# Patient Record
Sex: Female | Born: 1950 | Race: White | Hispanic: No | Marital: Single | Smoking: Never smoker
Health system: Southern US, Community
[De-identification: ages and names within clinical notes are randomized; demographics above are authoritative.]

## PROBLEM LIST (undated history)

## (undated) DIAGNOSIS — Z8719 Personal history of other diseases of the digestive system: Secondary | ICD-10-CM

## (undated) DIAGNOSIS — K219 Gastro-esophageal reflux disease without esophagitis: Secondary | ICD-10-CM

## (undated) DIAGNOSIS — T4145XA Adverse effect of unspecified anesthetic, initial encounter: Secondary | ICD-10-CM

## (undated) DIAGNOSIS — R112 Nausea with vomiting, unspecified: Secondary | ICD-10-CM

## (undated) DIAGNOSIS — C50919 Malignant neoplasm of unspecified site of unspecified female breast: Secondary | ICD-10-CM

## (undated) DIAGNOSIS — Z9221 Personal history of antineoplastic chemotherapy: Secondary | ICD-10-CM

## (undated) DIAGNOSIS — I10 Essential (primary) hypertension: Secondary | ICD-10-CM

## (undated) DIAGNOSIS — T8859XA Other complications of anesthesia, initial encounter: Secondary | ICD-10-CM

## (undated) DIAGNOSIS — Z9889 Other specified postprocedural states: Secondary | ICD-10-CM

## (undated) DIAGNOSIS — I499 Cardiac arrhythmia, unspecified: Secondary | ICD-10-CM

## (undated) DIAGNOSIS — E785 Hyperlipidemia, unspecified: Secondary | ICD-10-CM

## (undated) DIAGNOSIS — Z1371 Encounter for nonprocreative screening for genetic disease carrier status: Secondary | ICD-10-CM

## (undated) DIAGNOSIS — D649 Anemia, unspecified: Secondary | ICD-10-CM

## (undated) DIAGNOSIS — J45909 Unspecified asthma, uncomplicated: Secondary | ICD-10-CM

## (undated) DIAGNOSIS — C801 Malignant (primary) neoplasm, unspecified: Secondary | ICD-10-CM

## (undated) DIAGNOSIS — M1990 Unspecified osteoarthritis, unspecified site: Secondary | ICD-10-CM

## (undated) HISTORY — DX: Encounter for nonprocreative screening for genetic disease carrier status: Z13.71

## (undated) HISTORY — PX: OOPHORECTOMY: SHX86

## (undated) HISTORY — PX: BREAST EXCISIONAL BIOPSY: SUR124

## (undated) HISTORY — DX: Malignant neoplasm of unspecified site of unspecified female breast: C50.919

## (undated) HISTORY — PX: PORT-A-CATH REMOVAL: SHX5289

## (undated) HISTORY — DX: Malignant (primary) neoplasm, unspecified: C80.1

## (undated) HISTORY — PX: BREAST BIOPSY: SHX20

## (undated) HISTORY — PX: CARPAL TUNNEL RELEASE: SHX101

## (undated) HISTORY — PX: BREAST CYST EXCISION: SHX579

## (undated) HISTORY — PX: OTHER SURGICAL HISTORY: SHX169

---
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
## 1981-03-10 HISTORY — PX: NASAL RECONSTRUCTION: SHX2069

## 1988-03-10 HISTORY — PX: BREAST BIOPSY: SHX20

## 1996-03-10 HISTORY — PX: ABDOMINAL HYSTERECTOMY: SHX81

## 2003-05-09 DIAGNOSIS — I10 Essential (primary) hypertension: Secondary | ICD-10-CM | POA: Insufficient documentation

## 2004-06-18 ENCOUNTER — Ambulatory Visit: Payer: Self-pay | Admitting: Internal Medicine

## 2005-07-08 ENCOUNTER — Ambulatory Visit: Payer: Self-pay | Admitting: Internal Medicine

## 2005-08-18 ENCOUNTER — Ambulatory Visit: Payer: Self-pay | Admitting: Unknown Physician Specialty

## 2006-03-10 HISTORY — PX: CHOLECYSTECTOMY: SHX55

## 2006-06-12 ENCOUNTER — Ambulatory Visit: Payer: Self-pay | Admitting: Unknown Physician Specialty

## 2006-07-16 ENCOUNTER — Ambulatory Visit: Payer: Self-pay | Admitting: Internal Medicine

## 2007-01-20 ENCOUNTER — Ambulatory Visit: Payer: Self-pay | Admitting: Internal Medicine

## 2007-01-27 ENCOUNTER — Ambulatory Visit: Payer: Self-pay | Admitting: Internal Medicine

## 2007-02-19 ENCOUNTER — Ambulatory Visit: Payer: Self-pay | Admitting: General Surgery

## 2007-02-19 ENCOUNTER — Other Ambulatory Visit: Payer: Self-pay

## 2007-02-23 ENCOUNTER — Ambulatory Visit: Payer: Self-pay | Admitting: General Surgery

## 2007-07-20 ENCOUNTER — Ambulatory Visit: Payer: Self-pay | Admitting: Internal Medicine

## 2007-09-08 ENCOUNTER — Other Ambulatory Visit: Payer: Self-pay

## 2007-09-08 ENCOUNTER — Ambulatory Visit: Payer: Self-pay | Admitting: Orthopaedic Surgery

## 2007-09-28 ENCOUNTER — Ambulatory Visit: Payer: Self-pay | Admitting: Orthopaedic Surgery

## 2008-01-31 ENCOUNTER — Ambulatory Visit: Payer: Self-pay | Admitting: General Practice

## 2008-02-22 ENCOUNTER — Ambulatory Visit: Payer: Self-pay | Admitting: General Practice

## 2008-07-25 ENCOUNTER — Ambulatory Visit: Payer: Self-pay | Admitting: Internal Medicine

## 2008-08-16 ENCOUNTER — Ambulatory Visit: Payer: Self-pay | Admitting: Unknown Physician Specialty

## 2009-03-07 ENCOUNTER — Ambulatory Visit: Payer: Self-pay | Admitting: Urology

## 2009-03-13 ENCOUNTER — Ambulatory Visit: Payer: Self-pay | Admitting: Urology

## 2009-08-02 ENCOUNTER — Ambulatory Visit: Payer: Self-pay | Admitting: Internal Medicine

## 2010-02-21 DIAGNOSIS — D239 Other benign neoplasm of skin, unspecified: Secondary | ICD-10-CM

## 2010-02-21 HISTORY — DX: Other benign neoplasm of skin, unspecified: D23.9

## 2010-08-15 ENCOUNTER — Ambulatory Visit: Payer: Self-pay | Admitting: Internal Medicine

## 2010-09-04 ENCOUNTER — Other Ambulatory Visit: Payer: Self-pay | Admitting: Unknown Physician Specialty

## 2011-08-18 ENCOUNTER — Ambulatory Visit: Payer: Self-pay | Admitting: Internal Medicine

## 2011-08-20 ENCOUNTER — Ambulatory Visit: Payer: Self-pay | Admitting: Unknown Physician Specialty

## 2011-08-22 LAB — PATHOLOGY REPORT

## 2012-08-23 ENCOUNTER — Ambulatory Visit: Payer: Self-pay | Admitting: Internal Medicine

## 2013-08-24 ENCOUNTER — Ambulatory Visit: Payer: Self-pay | Admitting: Physician Assistant

## 2013-12-28 DIAGNOSIS — J45909 Unspecified asthma, uncomplicated: Secondary | ICD-10-CM | POA: Insufficient documentation

## 2013-12-28 DIAGNOSIS — K219 Gastro-esophageal reflux disease without esophagitis: Secondary | ICD-10-CM | POA: Insufficient documentation

## 2013-12-28 DIAGNOSIS — M1990 Unspecified osteoarthritis, unspecified site: Secondary | ICD-10-CM | POA: Insufficient documentation

## 2013-12-28 DIAGNOSIS — E785 Hyperlipidemia, unspecified: Secondary | ICD-10-CM | POA: Insufficient documentation

## 2014-01-18 ENCOUNTER — Encounter: Payer: Self-pay | Admitting: *Deleted

## 2014-01-19 ENCOUNTER — Encounter: Payer: Self-pay | Admitting: General Surgery

## 2014-01-19 ENCOUNTER — Ambulatory Visit (INDEPENDENT_AMBULATORY_CARE_PROVIDER_SITE_OTHER): Payer: Medicare Other | Admitting: General Surgery

## 2014-01-19 ENCOUNTER — Other Ambulatory Visit: Payer: Medicare Other

## 2014-01-19 VITALS — BP 122/60 | HR 70 | Temp 98.6°F | Resp 12 | Ht 66.5 in | Wt 159.0 lb

## 2014-01-19 DIAGNOSIS — N63 Unspecified lump in breast: Secondary | ICD-10-CM

## 2014-01-19 DIAGNOSIS — N6320 Unspecified lump in the left breast, unspecified quadrant: Secondary | ICD-10-CM

## 2014-01-19 NOTE — Progress Notes (Signed)
Patient ID: Mariah Hogan, female   DOB: Jul 30, 1950, 63 y.o.   MRN: [id number]  Chief Complaint  Patient presents with  . Other    left breast lump    HPI Mariah Hogan is a 63 y.o. female here today for a evaluation of a left breast lump. Patient noticed this past weekend. She states the area is sore to touch and overlying redness came up last night. Last month she thought she may have felt something "different" in the breast. Last mammogram was done on June 2015. Denies any injury. No history of nipple oral contact.   HPI  Past Medical History  Diagnosis Date  . Cancer     [phone] basal cell carcinoma    Past Surgical History  Procedure Laterality Date  . Tonsillectomy and adenoidectomy  1956  . Nasal reconstruction  1983  . Abdominal hysterectomy  1998  . Bladder tack  1856,3149  . Cholecystectomy  2008  . Carpal tunnel release Bilateral [id number]  . Breast biopsy Bilateral   . Breast biopsy Left 1990    Family History  Problem Relation Age of Onset  . Breast cancer Sister   . Cancer Paternal Grandmother     breast  . Cancer Cousin     breast  . Cancer Cousin     kidney    Social History History  Substance Use Topics  . Smoking status: Never Smoker   . Smokeless tobacco: Not on file  . Alcohol Use: No    Allergies  Allergen Reactions  . Penicillins     Current Outpatient Prescriptions  Medication Sig Dispense Refill  . atenolol (TENORMIN) 50 MG tablet Take 50 mg by mouth daily.    . citalopram (CELEXA) 20 MG tablet Take 20 mg by mouth daily.    Marland Kitchen losartan-hydrochlorothiazide (HYZAAR) 100-25 MG per tablet Take 1 tablet by mouth daily.    . meloxicam (MOBIC) 15 MG tablet Take 15 mg by mouth daily.    . pantoprazole (PROTONIX) 40 MG tablet Take 40 mg by mouth daily.     No current facility-administered medications for this visit.    Review of Systems Review of Systems  Constitutional: Negative.   Respiratory: Negative.   Cardiovascular:  Negative.     Blood pressure 122/60, pulse 70, temperature 98.6 F (37 C), temperature source Oral, resp. rate 12, height 5' 6.5" (1.689 m), weight 159 lb (72.122 kg).  Physical Exam Physical Exam  Constitutional: She is oriented to person, place, and time. She appears well-developed and well-nourished.  Neck: Neck supple.  Cardiovascular: Normal rate, regular rhythm and normal heart sounds.   Pulmonary/Chest: Effort normal and breath sounds normal. Right breast exhibits no inverted nipple, no mass, no nipple discharge, no skin change and no tenderness. Left breast exhibits skin change. Left breast exhibits no inverted nipple, no mass, no nipple discharge and no tenderness.    4 cm erythremia area lower outer quadrant left breast. 1.5 x 3 cm area of thickening below erythremia.   Lymphadenopathy:    She has no cervical adenopathy.    She has no axillary adenopathy.  Neurological: She is alert and oriented to person, place, and time.  Skin: Skin is warm and dry.    Data Reviewed Bilateral screening mammograms dated 08/24/2013 were reviewed. No interval change in comparison to previous exams. No abnormality in the lower outer quadrant of the left breast to correspond with today's exam. BI-RADS-1.  Ultrasound examination of the area of palpable thickening in  the lower outer quadrant of the left breast at the 4:00 position showed a ill-defined heterogeneous area with a slightly irregular border measuring 1.4 x 1.7 x 2.9 cm. No increased vascularity noted on duplex imaging.  The patient was amenable to core biopsy. 10 mL of 0.5% Xylocaine with 0.25% Marcaine with 1-200,000 of epinephrine was utilized well tolerated. ChloraPrep was applied to the skin.  A 14-gauge vacuum biopsy device was utilized and multiple core samples were taken from 2 locations within the mass. Needle location was guided both by ultrasound and by palpation. The patient tolerated the procedure well with scant bleeding. Skin  defect was closed with benzoin and Steri-Strips followed by Telfa and Tegaderm dressing. Written instructions regarding wound care were provided.  Assessment     Breast mass.    Plan    The patient will be contacted when pathology is available. Should a benign result be obtained, we will likely proceed to operative excision due to the unusual appearance.     Follow up based on pathology.  PCP:  Mariah Hogan 01/20/2014, 8:22 PM

## 2014-01-19 NOTE — Patient Instructions (Signed)
CARE AFTER BREAST BIOPSY  1. Leave the dressing on that your doctor applied after surgery. It is waterproof. You may bathe, shower and/or swim. The dressing will probably remain intact until your return office visit. If the dressing comes off, you will see small strips of tape against your skin on the incision. Do not remove these strips.  2. You may want to use a gauze,cloth or similar protection in your bra to prevent rubbing against your dressing and incision. This is not necessary, but you may feel more comfortable doing so.  3. It is recommended that you wear a bra day and night to give support to the breast. This will prevent the weight of the breast from pulling on the incision.  4. Your breast will feel hard and lumpy under the incision. Do not be alarmed. This is the underlying stitching of tissue. Softening of this tissue will occur in time.  5. Make sure you call the office and schedule an appointment in one week after your surgery. The office phone number is [phone]. [facility] at [facility] may have already done this for you.  6. You will notice about a week after your office visit that the strips of the tape on your incision will begin to loosen. These may then be removed.  7. Report to your doctor any of the following:  * Severe pain not relieved by your pain medication  *Redness of the incision  * Drainage from the incision  *Fever greater than 101 degrees

## 2014-01-20 DIAGNOSIS — N6321 Unspecified lump in the left breast, upper outer quadrant: Secondary | ICD-10-CM | POA: Insufficient documentation

## 2014-01-23 LAB — PATHOLOGY

## 2014-01-24 ENCOUNTER — Telehealth: Payer: Self-pay

## 2014-01-24 NOTE — Telephone Encounter (Signed)
Patient called and said that she would like to get her breast mass removed. She would prefer to have this done in office if possible.

## 2014-01-25 ENCOUNTER — Ambulatory Visit: Payer: Self-pay | Admitting: General Surgery

## 2014-01-25 NOTE — Telephone Encounter (Signed)
Please schedule 30 minute appointment for office excision.

## 2014-01-26 ENCOUNTER — Encounter: Payer: Self-pay | Admitting: General Surgery

## 2014-02-13 ENCOUNTER — Ambulatory Visit (INDEPENDENT_AMBULATORY_CARE_PROVIDER_SITE_OTHER): Payer: Medicare Other | Admitting: General Surgery

## 2014-02-13 ENCOUNTER — Encounter: Payer: Self-pay | Admitting: General Surgery

## 2014-02-13 VITALS — BP 132/76 | HR 76 | Resp 12 | Ht 66.0 in | Wt 160.0 lb

## 2014-02-13 DIAGNOSIS — N6320 Unspecified lump in the left breast, unspecified quadrant: Secondary | ICD-10-CM

## 2014-02-13 DIAGNOSIS — N63 Unspecified lump in breast: Secondary | ICD-10-CM

## 2014-02-13 HISTORY — PX: BREAST SURGERY: SHX581

## 2014-02-13 NOTE — Progress Notes (Signed)
Patient ID: Mariah Hogan, female   DOB: 1950-11-10, 63 y.o.   MRN: [id number]  Chief Complaint  Patient presents with  . Procedure    left breast biopsy     HPI Mariah Hogan is a 63 y.o. female here today for a left breast excision .  HPI  Past Medical History  Diagnosis Date  . Cancer     [phone] basal cell carcinoma    Past Surgical History  Procedure Laterality Date  . Tonsillectomy and adenoidectomy  1956  . Nasal reconstruction  1983  . Abdominal hysterectomy  1998  . Bladder tack  1062,6948  . Cholecystectomy  2008  . Carpal tunnel release Bilateral [id number]  . Breast biopsy Bilateral   . Breast biopsy Left 1990    Family History  Problem Relation Age of Onset  . Breast cancer Sister   . Cancer Paternal Grandmother     breast  . Cancer Cousin     breast  . Cancer Cousin     kidney    Social History History  Substance Use Topics  . Smoking status: Never Smoker   . Smokeless tobacco: Not on file  . Alcohol Use: No    Allergies  Allergen Reactions  . Penicillins     Current Outpatient Prescriptions  Medication Sig Dispense Refill  . atenolol (TENORMIN) 50 MG tablet Take 50 mg by mouth daily.    . citalopram (CELEXA) 20 MG tablet Take 20 mg by mouth daily.    Marland Kitchen losartan-hydrochlorothiazide (HYZAAR) 100-25 MG per tablet Take 1 tablet by mouth daily.    . meloxicam (MOBIC) 15 MG tablet Take 15 mg by mouth daily.    . pantoprazole (PROTONIX) 40 MG tablet Take 40 mg by mouth daily.     No current facility-administered medications for this visit.    Review of Systems Review of Systems  Blood pressure 132/76, pulse 76, resp. rate 12, height 5' 6"  (1.676 m), weight 160 lb (72.576 kg).  Physical Exam Physical Exam  Pulmonary/Chest:      Data Reviewed Diagnosis:  BREAST, LEFT, 4:00:  - FRAGMENTS OF FIBROADIPOSE TISSUE AND SCANT BENIGN BREAST  LOBULES.  NOTE: Preliminary results of this case were discussed with Dr.  Bary Castilla on  January 20, 2014 at 205 pm by Dr. Luana Shu. The final  diagnosis is unchanged after multiple additional deeper H/E  levels were examined.  XDB/01/23/2014   Assessment    Left breast mass, significant resolution of thickening and complete resolution of previously noted erythema. Benign biopsy results noted above.    Plan    The clinical impression of the time of her original evaluation was of malignancy. While the native biopsy was reassuring, and the resolution of the erythema noted on her last exam is encouraging, it was elected to proceed to excision of the residual thickening. A total of 20 mL of 0.5% Xylocaine was 0.25% Marcaine with 1 200,000 of epinephrine was utilized well tolerated. A radial incision was made in the 4-5:00 position and carried aphthous consulting this tissue. Scant bleeding was noted. The adipose tissue was elevated off the underlying breast parenchyma by sharp dissection and a 3 x 3 x 3 cm block of tissue encompassing the old biopsy site (as evidenced by the clip pellets) was completed. Orientating sutures were placed. He was sent in formalin for histology. The wound was closed in layers with 3-0 Vicryl figure-of-eight sutures in the breast parenchyma followed by similar sutures at the adipose layer. The skin  was closed with a running 4-0 Vicryl subcutaneous suture. Benzoin Steri-Strips Telfa and Tegaderm dressing applied.  Postbiopsy instructions were provided.  The patient will be contacted when biopsy results are received.  She was given a prescription for Norco 5/325, #30 with the inscription 1-2 by mouth every 4 hours when necessary for pain if needed.       Robert Bellow 02/14/2014, 11:43 AM

## 2014-02-13 NOTE — Patient Instructions (Signed)
CARE AFTER BREAST BIOPSY  1. Leave the dressing on that your doctor applied after surgery. It is waterproof. You may bathe, shower and/or swim. The dressing will probably remain intact until your return office visit. If the dressing comes off, you will see small strips of tape against your skin on the incision. Do not remove these strips.  2. You may want to use a gauze,cloth or similar protection in your bra to prevent rubbing against your dressing and incision. This is not necessary, but you may feel more comfortable doing so.  3. It is recommended that you wear a bra day and night to give support to the breast. This will prevent the weight of the breast from pulling on the incision.  4. Your breast will feel hard and lumpy under the incision. Do not be alarmed. This is the underlying stitching of tissue. Softening of this tissue will occur in time.  5. Make sure you call the office and schedule an appointment in one week after your surgery. The office phone number is [phone]. The nurses at [facility] may have already done this for you.  6. You will notice about a week after your office visit that the strips of the tape on your incision will begin to loosen. These may then be removed.  7. Report to your doctor any of the following:  * Severe pain not relieved by your pain medication  *Redness of the incision  * Drainage from the incision  *Fever greater than 101 degrees

## 2014-02-14 DIAGNOSIS — N6310 Unspecified lump in the right breast, unspecified quadrant: Secondary | ICD-10-CM | POA: Insufficient documentation

## 2014-02-15 ENCOUNTER — Telehealth: Payer: Self-pay | Admitting: *Deleted

## 2014-02-15 LAB — PATHOLOGY

## 2014-02-15 NOTE — Telephone Encounter (Signed)
Notified patient as instructed, "ok" benign breast tissue, patient pleased. Discussed follow-up appointments next week, patient agrees.

## 2014-02-21 ENCOUNTER — Encounter: Payer: Self-pay | Admitting: General Surgery

## 2014-02-21 ENCOUNTER — Ambulatory Visit (INDEPENDENT_AMBULATORY_CARE_PROVIDER_SITE_OTHER): Payer: Self-pay | Admitting: General Surgery

## 2014-02-21 VITALS — BP 130/70 | HR 74 | Resp 12 | Ht 66.0 in | Wt 160.0 lb

## 2014-02-21 DIAGNOSIS — N63 Unspecified lump in breast: Secondary | ICD-10-CM

## 2014-02-21 DIAGNOSIS — N6320 Unspecified lump in the left breast, unspecified quadrant: Secondary | ICD-10-CM

## 2014-02-21 NOTE — Patient Instructions (Signed)
Patient to return as needed. Continue self breast exams. Call office for any new breast issues or concerns.'

## 2014-02-21 NOTE — Progress Notes (Signed)
Patient ID: Mariah Hogan, female   DOB: 07-03-1950, 63 y.o.   MRN: [id number]  Chief Complaint  Patient presents with  . Routine Post Op    excision left breast mass    HPI Mariah Hogan is a 63 y.o. female. here today for he rpost op lefty breast excison done on 02/13/14. Patient states she is doing well with no problems.   HPI  Past Medical History  Diagnosis Date  . Cancer     [phone] basal cell carcinoma    Past Surgical History  Procedure Laterality Date  . Tonsillectomy and adenoidectomy  1956  . Nasal reconstruction  1983  . Abdominal hysterectomy  1998  . Bladder tack  6629,4765  . Cholecystectomy  2008  . Carpal tunnel release Bilateral [id number]  . Breast biopsy Bilateral   . Breast biopsy Left 1990  . Breast surgery Left 02/13/14    Fibrocystic changes, pseudo-angiomatous stromal hyperplasia. No atypia or malignancy.    Family History  Problem Relation Age of Onset  . Breast cancer Sister   . Cancer Paternal Grandmother     breast  . Cancer Cousin     breast  . Cancer Cousin     kidney    Social History History  Substance Use Topics  . Smoking status: Never Smoker   . Smokeless tobacco: Not on file  . Alcohol Use: No    Allergies  Allergen Reactions  . Penicillins     Current Outpatient Prescriptions  Medication Sig Dispense Refill  . atenolol (TENORMIN) 50 MG tablet Take 50 mg by mouth daily.    . citalopram (CELEXA) 20 MG tablet Take 20 mg by mouth daily.    Marland Kitchen losartan-hydrochlorothiazide (HYZAAR) 100-25 MG per tablet Take 1 tablet by mouth daily.    . meloxicam (MOBIC) 15 MG tablet Take 15 mg by mouth daily.    . pantoprazole (PROTONIX) 40 MG tablet Take 40 mg by mouth daily.    Marland Kitchen PREMARIN vaginal cream   3   No current facility-administered medications for this visit.    Review of Systems Review of Systems  Constitutional: Negative.   Respiratory: Negative.   Cardiovascular: Negative.     Blood pressure 130/70, pulse  74, resp. rate 12, height 5' 6"  (1.676 m), weight 160 lb (72.576 kg).  Physical Exam Physical Exam  Constitutional: She is oriented to person, place, and time. She appears well-developed and well-nourished.  Eyes: Conjunctivae are normal.  Neck: Neck supple.  Cardiovascular: Normal rate, regular rhythm and normal heart sounds.   Pulmonary/Chest: Effort normal and breath sounds normal. Right breast exhibits no inverted nipple, no mass, no nipple discharge, no skin change and no tenderness. Left breast exhibits no inverted nipple, no mass, no nipple discharge, no skin change and no tenderness.    Neurological: She is oriented to person, place, and time.  Skin: Skin is warm and dry.    Data Reviewed Original core biopsy was benign. Reexcision of the mass due to the overlying erythema and concern for deep malignancy was negative.  Assessment    Benign breast exam status post excisional biopsy.    Plan    Patient to return as needed. Continue self breast exams. Continue annual screening mammograms with her PCP. Call office for any new breast issues or concerns.  A copy of the pathology report was provided to the patient per her request.        Robert Bellow 02/22/2014, 5:43 AM

## 2014-02-22 ENCOUNTER — Encounter: Payer: Self-pay | Admitting: General Surgery

## 2014-08-10 ENCOUNTER — Other Ambulatory Visit: Payer: Self-pay | Admitting: Physician Assistant

## 2014-08-10 DIAGNOSIS — Z1231 Encounter for screening mammogram for malignant neoplasm of breast: Secondary | ICD-10-CM

## 2014-08-28 ENCOUNTER — Other Ambulatory Visit: Payer: Self-pay | Admitting: Physician Assistant

## 2014-08-28 ENCOUNTER — Ambulatory Visit
Admission: RE | Admit: 2014-08-28 | Discharge: 2014-08-28 | Disposition: A | Discharge status: Home / Self Care | Payer: Medicare Other | Source: Ambulatory Visit | Attending: Physician Assistant | Admitting: Physician Assistant

## 2014-08-28 DIAGNOSIS — Z1231 Encounter for screening mammogram for malignant neoplasm of breast: Secondary | ICD-10-CM

## 2015-01-11 ENCOUNTER — Encounter: Payer: Self-pay | Admitting: *Deleted

## 2015-01-12 ENCOUNTER — Encounter: Payer: Self-pay | Admitting: *Deleted

## 2015-01-12 ENCOUNTER — Encounter
Admission: RE | Disposition: A | Discharge status: Home Health | Payer: Self-pay | Source: Ambulatory Visit | Attending: Unknown Physician Specialty

## 2015-01-12 ENCOUNTER — Ambulatory Visit: Payer: Medicare Other | Admitting: Anesthesiology

## 2015-01-12 ENCOUNTER — Ambulatory Visit: Payer: Medicare Other

## 2015-01-12 ENCOUNTER — Ambulatory Visit
Admission: RE | Admit: 2015-01-12 | Discharge: 2015-01-12 | Disposition: A | Discharge status: Home Health | Payer: Medicare Other | Source: Ambulatory Visit | Attending: Unknown Physician Specialty | Admitting: Unknown Physician Specialty

## 2015-01-12 DIAGNOSIS — K640 First degree hemorrhoids: Secondary | ICD-10-CM | POA: Insufficient documentation

## 2015-01-12 DIAGNOSIS — K222 Esophageal obstruction: Secondary | ICD-10-CM | POA: Diagnosis not present

## 2015-01-12 DIAGNOSIS — M1990 Unspecified osteoarthritis, unspecified site: Secondary | ICD-10-CM | POA: Diagnosis not present

## 2015-01-12 DIAGNOSIS — E785 Hyperlipidemia, unspecified: Secondary | ICD-10-CM | POA: Diagnosis not present

## 2015-01-12 DIAGNOSIS — Z85828 Personal history of other malignant neoplasm of skin: Secondary | ICD-10-CM | POA: Insufficient documentation

## 2015-01-12 DIAGNOSIS — B3781 Candidal esophagitis: Secondary | ICD-10-CM | POA: Insufficient documentation

## 2015-01-12 DIAGNOSIS — K317 Polyp of stomach and duodenum: Secondary | ICD-10-CM | POA: Diagnosis not present

## 2015-01-12 DIAGNOSIS — Z79899 Other long term (current) drug therapy: Secondary | ICD-10-CM | POA: Insufficient documentation

## 2015-01-12 DIAGNOSIS — D125 Benign neoplasm of sigmoid colon: Secondary | ICD-10-CM | POA: Diagnosis not present

## 2015-01-12 DIAGNOSIS — R1310 Dysphagia, unspecified: Secondary | ICD-10-CM | POA: Diagnosis present

## 2015-01-12 DIAGNOSIS — R059 Cough, unspecified: Secondary | ICD-10-CM

## 2015-01-12 DIAGNOSIS — I10 Essential (primary) hypertension: Secondary | ICD-10-CM | POA: Insufficient documentation

## 2015-01-12 DIAGNOSIS — R05 Cough: Secondary | ICD-10-CM

## 2015-01-12 DIAGNOSIS — J45909 Unspecified asthma, uncomplicated: Secondary | ICD-10-CM | POA: Insufficient documentation

## 2015-01-12 DIAGNOSIS — K219 Gastro-esophageal reflux disease without esophagitis: Secondary | ICD-10-CM | POA: Diagnosis not present

## 2015-01-12 HISTORY — PX: COLONOSCOPY WITH PROPOFOL: SHX5780

## 2015-01-12 HISTORY — DX: Hyperlipidemia, unspecified: E78.5

## 2015-01-12 HISTORY — DX: Gastro-esophageal reflux disease without esophagitis: K21.9

## 2015-01-12 HISTORY — PX: SAVORY DILATION: SHX5439

## 2015-01-12 HISTORY — DX: Unspecified osteoarthritis, unspecified site: M19.90

## 2015-01-12 HISTORY — DX: Essential (primary) hypertension: I10

## 2015-01-12 HISTORY — PX: ESOPHAGOGASTRODUODENOSCOPY (EGD) WITH PROPOFOL: SHX5813

## 2015-01-12 HISTORY — DX: Unspecified asthma, uncomplicated: J45.909

## 2015-01-12 SURGERY — COLONOSCOPY WITH PROPOFOL
Anesthesia: General

## 2015-01-12 MED ORDER — PROPOFOL 10 MG/ML IV BOLUS
INTRAVENOUS | Status: DC | PRN
  Administered 2015-01-12 (×2): 10 mg via INTRAVENOUS
  Administered 2015-01-12: 70 mg via INTRAVENOUS

## 2015-01-12 MED ORDER — MIDAZOLAM HCL 2 MG/2ML IJ SOLN
INTRAMUSCULAR | Status: DC | PRN
  Administered 2015-01-12: 1 mg via INTRAVENOUS

## 2015-01-12 MED ORDER — SODIUM CHLORIDE 0.9 % IV SOLN
INTRAVENOUS | Status: DC
  Administered 2015-01-12: 1000 mL via INTRAVENOUS

## 2015-01-12 MED ORDER — LIDOCAINE VISCOUS 2 % MT SOLN
15.0000 mL | Freq: Once | OROMUCOSAL | Status: AC
  Administered 2015-01-12: 15 mL via OROMUCOSAL
  Filled 2015-01-12: qty 15

## 2015-01-12 MED ORDER — FENTANYL CITRATE (PF) 100 MCG/2ML IJ SOLN
INTRAMUSCULAR | Status: DC | PRN
  Administered 2015-01-12: 50 ug via INTRAVENOUS

## 2015-01-12 MED ORDER — PROPOFOL 500 MG/50ML IV EMUL
INTRAVENOUS | Status: DC | PRN
  Administered 2015-01-12: 160 ug/kg/min via INTRAVENOUS

## 2015-01-12 MED ORDER — IPRATROPIUM-ALBUTEROL 0.5-2.5 (3) MG/3ML IN SOLN
RESPIRATORY_TRACT | Status: AC
  Administered 2015-01-12: 3 mL
  Filled 2015-01-12: qty 3

## 2015-01-12 MED ORDER — SODIUM CHLORIDE 0.9 % IV SOLN: INTRAVENOUS | Status: DC

## 2015-01-12 NOTE — Anesthesia Preprocedure Evaluation (Signed)
Anesthesia Evaluation  Patient identified by MRN, date of birth, ID band Patient awake    Reviewed: Allergy & Precautions, H&P , NPO status , Patient's Chart, lab work & pertinent test results  History of Anesthesia Complications Negative for: history of anesthetic complications  Airway Mallampati: II  TM Distance: >3 FB Neck ROM: full    Dental  (+) Poor Dentition, Chipped   Pulmonary neg shortness of breath, asthma ,    Pulmonary exam normal breath sounds clear to auscultation       Cardiovascular Exercise Tolerance: Good hypertension, (-) angina(-) Past MI and (-) DOE Normal cardiovascular exam+ dysrhythmias  Rhythm:regular Rate:Normal     Neuro/Psych negative neurological ROS  negative psych ROS   GI/Hepatic Neg liver ROS, GERD  Controlled,  Endo/Other  negative endocrine ROS  Renal/GU negative Renal ROS  negative genitourinary   Musculoskeletal  (+) Arthritis ,   Abdominal   Peds  Hematology negative hematology ROS (+)   Anesthesia Other Findings Past Medical History:   Cancer ([place])                                                   Comment:1993,2007,2014 basal cell carcinoma   Asthma                                                       GERD (gastroesophageal reflux disease)                       Arthritis                                                    Hypertension                                                 Hyperlipidemia                                              Past Surgical History:   [place]         ABDOMINAL HYSTERECTOMY                           1998         bladder tack  7282,0601    CHOLECYSTECTOMY                                  2008         BREAST BIOPSY                                   Bilateral              BREAST BIOPSY                                    Left 1990         BREAST SURGERY                                  Left 02/13/14        Comment:Fibrocystic changes, pseudo-angiomatous stromal              hyperplasia. No atypia or malignancy.   BREAST CYST EXCISION                             x3           CARPAL TUNNEL RELEASE                            x2          BMI    Body Mass Index   24.22 kg/m 2    Patient reports that she does that think that any food or pills are stuck in her throat at this time.   Reproductive/Obstetrics negative OB ROS                             Anesthesia Physical Anesthesia Plan  ASA: III  Anesthesia Plan: General   Post-op Pain Management:    Induction:   Airway Management Planned:   Additional Equipment:   Intra-op Plan:   Post-operative Plan:   Informed Consent: I have reviewed the patients History and Physical, chart, labs and discussed the procedure including the risks, benefits and alternatives for the proposed anesthesia with the patient or authorized representative who has indicated his/her understanding and acceptance.   Dental Advisory Given  Plan Discussed with: Anesthesiologist, CRNA and Surgeon  Anesthesia Plan Comments:         Anesthesia Quick Evaluation

## 2015-01-12 NOTE — Progress Notes (Signed)
[place] 43 [address] 46

## 2015-01-12 NOTE — Anesthesia Postprocedure Evaluation (Signed)
  Anesthesia Post-op Note  Patient: Mariah Hogan  Procedure(s) Performed: Procedure(s): COLONOSCOPY WITH PROPOFOL (N/A) ESOPHAGOGASTRODUODENOSCOPY (EGD) WITH PROPOFOL (N/A) SAVORY DILATION (N/A)  Anesthesia type:General  Patient location: PACU  Post pain: Pain level controlled  Post assessment: Post-op Vital signs reviewed, Patient's Cardiovascular Status Stable, Respiratory Function Stable, Patent Airway and No signs of Nausea or vomiting  Post vital signs: Reviewed and stable  Last Vitals:  Filed Vitals:   01/12/15 1740  BP: 151/75  Pulse: 70  Temp:   Resp: 16    Level of consciousness: awake, alert  and patient cooperative  Complications: No apparent anesthesia complications

## 2015-01-12 NOTE — H&P (Signed)
Primary Care Physician:  Marinda Elk, MD Primary Gastroenterologist:  Dr. Vira Agar  Pre-Procedure History & Physical: HPI:  Mariah Hogan is a 64 y.o. female is here for an endoscopy and colonoscopy.   Past Medical History  Diagnosis Date  . Cancer ([place])     [phone] basal cell carcinoma  . Asthma   . GERD (gastroesophageal reflux disease)   . Arthritis   . Hypertension   . Hyperlipidemia     Past Surgical History  Procedure Laterality Date  . Tonsillectomy and adenoidectomy  1956  . Nasal reconstruction  1983  . Abdominal hysterectomy  1998  . Bladder tack  4403,4742  . Cholecystectomy  2008  . Breast biopsy Bilateral   . Breast biopsy Left 1990  . Breast surgery Left 02/13/14    Fibrocystic changes, pseudo-angiomatous stromal hyperplasia. No atypia or malignancy.  . Breast cyst excision  x3  . Carpal tunnel release  x2    Prior to Admission medications   Medication Sig Start Date End Date Taking? Authorizing Provider  atenolol (TENORMIN) 50 MG tablet Take 50 mg by mouth daily.   Yes Historical Provider, MD  Cholecalciferol 1000 UNITS tablet Take 1,000 Units by mouth daily.   Yes Historical Provider, MD  citalopram (CELEXA) 20 MG tablet Take 20 mg by mouth daily.   Yes Historical Provider, MD  colestipol (COLESTID) 1 G tablet Take 1 g by mouth 2 (two) times daily.   Yes Historical Provider, MD  losartan-hydrochlorothiazide (HYZAAR) 100-25 MG per tablet Take 1 tablet by mouth daily.   Yes Historical Provider, MD  meloxicam (MOBIC) 15 MG tablet Take 15 mg by mouth daily.   Yes Historical Provider, MD  Multiple Vitamin (MULTIVITAMIN) tablet Take 1 tablet by mouth daily.   Yes Historical Provider, MD  pantoprazole (PROTONIX) 40 MG tablet Take 40 mg by mouth daily.   Yes Historical Provider, MD  PREMARIN vaginal cream  02/15/14  Yes Historical Provider, MD  vitamin A 10000 UNIT capsule Take 10,000 Units by mouth daily.   Yes Historical Provider, MD  vitamin  B-12 (CYANOCOBALAMIN) 1000 MCG tablet Take 1,000 mcg by mouth daily.   Yes Historical Provider, MD  vitamin C (ASCORBIC ACID) 500 MG tablet Take 500 mg by mouth daily.   Yes Historical Provider, MD    Allergies as of 01/10/2015 - Review Complete 01/19/2014  Allergen Reaction Noted  . Penicillins  01/19/2014    Family History  Problem Relation Age of Onset  . Breast cancer Sister   . Cancer Paternal Grandmother     breast  . Cancer Cousin     breast  . Cancer Cousin     kidney    Social History   Social History  . Marital Status: Single    Spouse Name: N/A  . Number of Children: N/A  . Years of Education: N/A   Occupational History  . Not on file.   Social History Main Topics  . Smoking status: Never Smoker   . Smokeless tobacco: Never Used  . Alcohol Use: No  . Drug Use: No  . Sexual Activity: Not on file   Other Topics Concern  . Not on file   Social History Narrative    Review of Systems: See HPI, otherwise negative ROS  Physical Exam: BP 104/56 mmHg  Pulse 74  Temp(Src) 98.7 F (37.1 C) (Oral)  Resp 20  Ht 5' 6"  (1.676 m)  Wt 68.04 kg (150 lb)  BMI 24.22 kg/m2  SpO2 99% General:   Alert,  pleasant and cooperative in NAD Head:  Normocephalic and atraumatic. Neck:  Supple; no masses or thyromegaly. Lungs:  Clear throughout to auscultation.    Heart:  Regular rate and rhythm. Abdomen:  Soft, nontender and nondistended. Normal bowel sounds, without guarding, and without rebound.   Neurologic:  Alert and  oriented x4;  grossly normal neurologically.  Impression/Plan: Mariah Hogan is here for an endoscopy and colonoscopy to be performed for [facility] colon polyps and dysphagia  Risks, benefits, limitations, and alternatives regarding  endoscopy and colonoscopy have been reviewed with the patient.  Questions have been answered.  All parties agreeable.   Gaylyn Cheers, MD  01/12/2015, 3:40 PM

## 2015-01-12 NOTE — Op Note (Signed)
[facility] Gastroenterology Patient Name: Mariah Hogan Procedure Date: 01/12/2015 3:39 PM MRN: [id number] Account #: [id number] Date of Birth: 30-May-1950 Admit Type: Outpatient Age: 64 Room: [facility] ENDO ROOM 1 Gender: Female Note Status: Finalized Procedure:         Upper GI endoscopy Indications:       Dysphagia Providers:         Manya Silvas, MD Medicines:         Propofol per Anesthesia Complications:     No immediate complications. Procedure:         Pre-Anesthesia Assessment:                    - After reviewing the risks and benefits, the patient was                     deemed in satisfactory condition to undergo the procedure.                    After obtaining informed consent, the endoscope was passed                     under direct vision. Throughout the procedure, the                     patient's blood pressure, pulse, and oxygen saturations                     were monitored continuously. The Endoscope was introduced                     through the mouth, and advanced to the second part of                     duodenum. The upper GI endoscopy was accomplished without                     difficulty. The patient tolerated the procedure well. Findings:      Mucosal changes including ringed esophagus and feline appearance were       found in the middle third of the esophagus and in the lower third of the       esophagus. Biopsies were taken with a cold forceps for histology.      A moderate Schatzki ring (acquired) was found at the gastroesophageal       junction. At the end of the procedure A guidewire was placed and the       scope was withdrawn. Dilation was performed with a Savary dilator with       mild resistance at 15 mm and 16 mm.      Multiple small pedunculated and sessile polyps with no bleeding and no       stigmata of recent bleeding were found in the gastric body. Biopsies       were taken with a cold forceps for histology.      The  examined duodenum was normal. Impression:        - Esophageal mucosal changes suggestive of eosinophilic                     esophagitis. Biopsied.                    - Moderate Schatzki ring. Dilated.                    -  Multiple gastric polyps. Biopsied.                    - Normal examined duodenum. Recommendation:    - Await pathology results.                    - Perform a colonoscopy as previously scheduled. Manya Silvas, MD 01/12/2015 4:01:08 PM This report has been signed electronically. Number of Addenda: 0 Note Initiated On: 01/12/2015 3:39 PM      [facility]

## 2015-01-12 NOTE — Transfer of Care (Signed)
Immediate Anesthesia Transfer of Care Note  Patient: Mariah Hogan  Procedure(s) Performed: Procedure(s): COLONOSCOPY WITH PROPOFOL (N/A) ESOPHAGOGASTRODUODENOSCOPY (EGD) WITH PROPOFOL (N/A) SAVORY DILATION (N/A)  Patient Location: PACU  Anesthesia Type:General  Level of Consciousness: awake and patient cooperative  Airway & Oxygen Therapy: Patient Spontanous Breathing and Patient connected to nasal cannula oxygen  Post-op Assessment: Report given to RN and Post -op Vital signs reviewed and stable  Post vital signs: Reviewed and stable  Last Vitals:  Filed Vitals:   01/12/15 1627  BP: 146/73  Pulse: 69  Temp: 96.54F  Resp: 20    Complications: No apparent anesthesia complications

## 2015-01-12 NOTE — Op Note (Signed)
[facility] Gastroenterology Patient Name: Mariah Hogan Procedure Date: 01/12/2015 3:38 PM MRN: [id number] Account #: [id number] Date of Birth: 12-May-1950 Admit Type: Outpatient Age: 64 Room: [facility] ENDO ROOM 1 Gender: Female Note Status: Finalized Procedure:         Colonoscopy Indications:       High risk colon cancer surveillance: Personal history of                     colonic polyps Providers:         Manya Silvas, MD Referring MD:      Precious Bard, MD (Referring MD) Medicines:         Propofol per Anesthesia Complications:     No immediate complications. Procedure:         Pre-Anesthesia Assessment:                    - After reviewing the risks and benefits, the patient was                     deemed in satisfactory condition to undergo the procedure.                    After obtaining informed consent, the colonoscope was                     passed under direct vision. Throughout the procedure, the                     patient's blood pressure, pulse, and oxygen saturations                     were monitored continuously. The Colonoscope was                     introduced through the anus and advanced to the the cecum,                     identified by appendiceal orifice and ileocecal valve. The                     colonoscopy was performed without difficulty. The patient                     tolerated the procedure well. The quality of the bowel                     preparation was good. Findings:      A diminutive polyp was found in the proximal sigmoid colon. The polyp       was sessile. The polyp was removed with a jumbo cold forceps. Resection       and retrieval were complete.      Internal hemorrhoids were found during endoscopy. The hemorrhoids were       small and Grade I (internal hemorrhoids that do not prolapse).      The exam was otherwise without abnormality on direct and retroflexion       views. Impression:        - One  diminutive polyp in the proximal sigmoid colon.                     Resected and retrieved.                    -  Internal hemorrhoids.                    - The examination was otherwise normal on direct and                     retroflexion views. Recommendation:    - Await pathology results. Manya Silvas, MD 01/12/2015 4:22:35 PM This report has been signed electronically. Number of Addenda: 0 Note Initiated On: 01/12/2015 3:38 PM Scope Withdrawal Time: 0 hours 11 minutes 57 seconds  Total Procedure Duration: 0 hours 17 minutes 5 seconds       [facility]

## 2015-01-15 ENCOUNTER — Encounter: Payer: Self-pay | Admitting: Unknown Physician Specialty

## 2015-01-16 LAB — SURGICAL PATHOLOGY

## 2015-08-21 ENCOUNTER — Other Ambulatory Visit: Payer: Self-pay | Admitting: Physician Assistant

## 2015-08-21 DIAGNOSIS — Z1231 Encounter for screening mammogram for malignant neoplasm of breast: Secondary | ICD-10-CM

## 2015-09-05 ENCOUNTER — Other Ambulatory Visit: Payer: Self-pay | Admitting: Physician Assistant

## 2015-09-05 ENCOUNTER — Ambulatory Visit
Admission: RE | Admit: 2015-09-05 | Discharge: 2015-09-05 | Disposition: A | Discharge status: Home / Self Care | Payer: Medicare Other | Source: Ambulatory Visit | Attending: Physician Assistant | Admitting: Physician Assistant

## 2015-09-05 DIAGNOSIS — Z1231 Encounter for screening mammogram for malignant neoplasm of breast: Secondary | ICD-10-CM | POA: Diagnosis not present

## 2016-01-29 DIAGNOSIS — C4491 Basal cell carcinoma of skin, unspecified: Secondary | ICD-10-CM

## 2016-01-29 HISTORY — DX: Basal cell carcinoma of skin, unspecified: C44.91

## 2016-03-10 HISTORY — PX: BREAST BIOPSY: SHX20

## 2016-05-19 ENCOUNTER — Ambulatory Visit (INDEPENDENT_AMBULATORY_CARE_PROVIDER_SITE_OTHER): Payer: Medicare Other | Admitting: General Surgery

## 2016-05-19 ENCOUNTER — Encounter: Payer: Self-pay | Admitting: General Surgery

## 2016-05-19 ENCOUNTER — Inpatient Hospital Stay: Payer: Self-pay

## 2016-05-19 VITALS — BP 140/78 | HR 74 | Resp 12 | Ht 66.0 in | Wt 167.0 lb

## 2016-05-19 DIAGNOSIS — N6320 Unspecified lump in the left breast, unspecified quadrant: Secondary | ICD-10-CM

## 2016-05-19 NOTE — Patient Instructions (Addendum)
Patient to return for left breast biopsy

## 2016-05-19 NOTE — Progress Notes (Signed)
Patient ID: Mariah Hogan, female   DOB: 02-Jan-1951, 66 y.o.   MRN: [id number]  Chief Complaint  Patient presents with  . Other    HPI Mariah Hogan is a 66 y.o. female here today for left breast tenderness. She states she noticed a lump in her left breast about two months ago. The area is only tender with touch . Last mammogram was 09/05/2015. HPI  Past Medical History:  Diagnosis Date  . Arthritis   . Asthma   . Cancer ([place])    [phone] basal cell carcinoma  . GERD (gastroesophageal reflux disease)   . Hyperlipidemia   . Hypertension     Past Surgical History:  Procedure Laterality Date  . ABDOMINAL HYSTERECTOMY  1998  . bladder tack  2376,2831  . BREAST BIOPSY Bilateral   . BREAST BIOPSY Left 1990  . BREAST CYST EXCISION  x3  . BREAST SURGERY Left 02/13/14   Fibrocystic changes, pseudo-angiomatous stromal hyperplasia. No atypia or malignancy.  . CARPAL TUNNEL RELEASE  x2  . CHOLECYSTECTOMY  2008  . COLONOSCOPY WITH PROPOFOL N/A 01/12/2015   Procedure: COLONOSCOPY WITH PROPOFOL;  Surgeon: Manya Silvas, MD;  Location: [facility] ENDOSCOPY;  Service: Endoscopy;  Laterality: N/A;  . ESOPHAGOGASTRODUODENOSCOPY (EGD) WITH PROPOFOL N/A 01/12/2015   Procedure: ESOPHAGOGASTRODUODENOSCOPY (EGD) WITH PROPOFOL;  Surgeon: Manya Silvas, MD;  Location: [facility] ENDOSCOPY;  Service: Endoscopy;  Laterality: N/A;  . NASAL RECONSTRUCTION  1983  . SAVORY DILATION N/A 01/12/2015   Procedure: SAVORY DILATION;  Surgeon: Manya Silvas, MD;  Location: [facility] ENDOSCOPY;  Service: Endoscopy;  Laterality: N/A;  . TONSILLECTOMY AND ADENOIDECTOMY  1956    Family History  Problem Relation Age of Onset  . Breast cancer Sister     age 47  . Cancer Paternal Grandmother     breast  . Cancer Cousin     breast  . Cancer Cousin     kidney    Social History Social History  Substance Use Topics  . Smoking status: Never Smoker  . Smokeless tobacco: Never Used  . Alcohol use No    Allergies   Allergen Reactions  . Penicillins     Current Outpatient Prescriptions  Medication Sig Dispense Refill  . atenolol (TENORMIN) 50 MG tablet Take 50 mg by mouth daily.    . benzonatate (TESSALON) 200 MG capsule Take 200 mg by mouth 3 (three) times daily as needed for cough.    . Cholecalciferol 1000 UNITS tablet Take 1,000 Units by mouth daily.    . citalopram (CELEXA) 20 MG tablet Take 20 mg by mouth daily.    . colestipol (COLESTID) 1 G tablet Take 1 g by mouth 2 (two) times daily.    Marland Kitchen losartan-hydrochlorothiazide (HYZAAR) 100-25 MG per tablet Take 1 tablet by mouth daily.    . meloxicam (MOBIC) 15 MG tablet Take 15 mg by mouth daily.    . pantoprazole (PROTONIX) 40 MG tablet Take 40 mg by mouth daily.    . predniSONE (DELTASONE) 10 MG tablet   0  . vitamin A 10000 UNIT capsule Take 10,000 Units by mouth daily.    . vitamin B-12 (CYANOCOBALAMIN) 1000 MCG tablet Take 1,000 mcg by mouth daily.    . vitamin C (ASCORBIC ACID) 500 MG tablet Take 500 mg by mouth daily.    . Multiple Vitamin (MULTIVITAMIN) tablet Take 1 tablet by mouth daily.     No current facility-administered medications for this visit.     Review of  Systems Review of Systems  Constitutional: Negative.   Respiratory: Negative.   Cardiovascular: Negative.     Blood pressure 140/78, pulse 74, resp. rate 12, height 5' 6"  (1.676 m), weight 167 lb (75.8 kg).  Physical Exam Physical Exam  Constitutional: She is oriented to person, place, and time. She appears well-developed and well-nourished.  Eyes: Conjunctivae are normal. No scleral icterus.  Neck: Neck supple.  Cardiovascular: Normal rate, regular rhythm and normal heart sounds.   Pulmonary/Chest: Effort normal and breath sounds normal. Right breast exhibits no inverted nipple, no mass, no nipple discharge, no skin change and no tenderness. Left breast exhibits tenderness. Left breast exhibits no inverted nipple, no mass, no nipple discharge and no skin change.     Abdominal: Soft. Bowel sounds are normal.  Lymphadenopathy:    She has no cervical adenopathy.    She has no axillary adenopathy.  Neurological: She is alert and oriented to person, place, and time.  Skin: Skin is warm and dry.    Data Reviewed 09/05/2015 mammograms reviewed. Dense breast. BI-RADS-2.  Ultrasound examination was undertaken in the area of palpable thickening at the 11:30-12:00 position of the left breast. There is a there is a smoothly marginated nodule with a suggestion of a hyperechoic rim and posterior acoustic enhancement corresponding to the area of palpable thickening is appreciated in this area. Associated with this based bar area is a less than well-defined hypoechoic mass just inferior to this 7 cm from the nipple that likely represents a cluster of small cyst. In aggregate this measures less than 0.55 cm.. No increased vascular flow.  At the 2:00 position, 2 cm from the nipple there are multiple cysts measuring in aggregate less than 0.6 cm.  At the 4:00 position, 4 cm the nipple a hypoechoic mass with several lobulations and faint acoustic enhancement is noted. This is less than 0.6 cm in diameter. BI-RADS-3  None of these lesions have mammographic correlates on her June 2017 films.   Assessment    New focal nodularity with tenderness to palpation in the upper aspect of the left breast associated with multilobulated cyst.  Multiple other areas of small cystic dilatation.  Family history of breast cancer (sister).    Plan    With the multiple areas of cystic dilatation and less concerned about possible malignancy. The patient has been through multiple biopsies in the past. These have yielded benign results. With her sister's diagnosis of breast cancer 59s she's concerned with this new palpable finding on her monthly exam and biopsy seems a reasonable solution to this issue.   Next the patient has a trip coming up later this week to [facility] and we  will postpone biopsy until next week when she returns.    Patient to return for left breast biopsy  This information has been scribed by Gaspar Cola CMA.   Robert Bellow 05/19/2016, 5:02 PM

## 2016-05-27 ENCOUNTER — Ambulatory Visit: Payer: Medicare Other | Admitting: General Surgery

## 2016-06-04 ENCOUNTER — Inpatient Hospital Stay: Payer: Self-pay

## 2016-06-04 ENCOUNTER — Encounter: Payer: Self-pay | Admitting: General Surgery

## 2016-06-04 ENCOUNTER — Ambulatory Visit (INDEPENDENT_AMBULATORY_CARE_PROVIDER_SITE_OTHER): Payer: Medicare Other | Admitting: General Surgery

## 2016-06-04 VITALS — BP 106/68 | HR 64 | Resp 12 | Ht 66.0 in | Wt 162.0 lb

## 2016-06-04 DIAGNOSIS — N6320 Unspecified lump in the left breast, unspecified quadrant: Secondary | ICD-10-CM

## 2016-06-04 DIAGNOSIS — N6321 Unspecified lump in the left breast, upper outer quadrant: Secondary | ICD-10-CM

## 2016-06-04 NOTE — Patient Instructions (Signed)
The patient is aware to call back for any questions or concerns.    CARE AFTER BREAST BIOPSY  1. Leave the dressing on that your doctor applied after the biopsy. It is waterproof. You may bathe, shower and/or swim. The dressing can be removed in 3 days, you will see small strips of tape against your skin on the incision. Do not remove these strips they will gradually fall off in about 2-3 weeks. You may use an ice pack on and off for the first 12-24 hours for comfort.  2. You may want to use a gauze,cloth or similar protection in your bra to prevent rubbing against your dressing and incision. This is not necessary, but you may feel more comfortable doing so.  3. It is recommended that you wear a bra day and night to give support to the breast. This will prevent the weight of the breast from pulling on the incision.  4. Your breast may feel hard and lumpy under the incision. Do not be alarmed. This is the underlying stitching of tissue. Softening of this tissue will occur in time.  5. You may have a follow up appointment or phone follow up in one week after your biopsy. The office phone number is [phone].  6. You will notice about a week or two after your office visit that the strips of the tape on your incision will begin to loosen. These may then be removed.  7. Report to your doctor any of the following:  * Severe pain not relieved by your pain medication  *Redness of the incision  * Drainage from the incision  *Fever greater than 101 degrees

## 2016-06-04 NOTE — Progress Notes (Signed)
Patient ID: Mariah Hogan, female   DOB: 31-Dec-1950, 66 y.o.   MRN: [id number]  Chief Complaint  Patient presents with  . Procedure    left breast biopsy     HPI Mariah Hogan is a 66 y.o. female here today for left breast biopsy. She is getting ready to start an antibiotic for bronchitis.    HPI  Past Medical History:  Diagnosis Date  . Arthritis   . Asthma   . Cancer ([place])    [phone] basal cell carcinoma  . GERD (gastroesophageal reflux disease)   . Hyperlipidemia   . Hypertension     Past Surgical History:  Procedure Laterality Date  . ABDOMINAL HYSTERECTOMY  1998  . bladder tack  [id number]  . BREAST BIOPSY Bilateral   . BREAST BIOPSY Left 1990  . BREAST CYST EXCISION  x3  . BREAST SURGERY Left 02/13/14   Fibrocystic changes, pseudo-angiomatous stromal hyperplasia. No atypia or malignancy.  . CARPAL TUNNEL RELEASE  x2  . CHOLECYSTECTOMY  2008  . COLONOSCOPY WITH PROPOFOL N/A 01/12/2015   Procedure: COLONOSCOPY WITH PROPOFOL;  Surgeon: Manya Silvas, MD;  Location[facility] ENDOSCOPY;  Service: Endoscopy;  Laterality: N/A;  . ESOPHAGOGASTRODUODENOSCOPY (EGD) WITH PROPOFOL N/A 01/12/2015   Procedure: ESOPHAGOGASTRODUODENOSCOPY (EGD) WITH PROPOFOL;  Surgeon: Manya Silvas, MD;  Location: [facility] ENDOSCOPY;  Service: Endoscopy;  Laterality: N/A;  . NASAL RECONSTRUCTION  1983  . SAVORY DILATION N/A 01/12/2015   Procedure: SAVORY DILATION;  Surgeon: Manya Silvas, MD;  Location: [facility] ENDOSCOPY;  Service: Endoscopy;  Laterality: N/A;  . TONSILLECTOMY AND ADENOIDECTOMY  1956    Family History  Problem Relation Age of Onset  . Breast cancer Sister     age 20  . Cancer Paternal Grandmother     breast  . Cancer Cousin     breast  . Cancer Cousin     kidney    Social History Social History  Substance Use Topics  . Smoking status: Never Smoker  . Smokeless tobacco: Never Used  . Alcohol use No    Allergies  Allergen Reactions  . Penicillins      Current Outpatient Prescriptions  Medication Sig Dispense Refill  . atenolol (TENORMIN) 50 MG tablet Take 50 mg by mouth daily.    . benzonatate (TESSALON) 200 MG capsule Take 200 mg by mouth 3 (three) times daily as needed for cough.    . Cholecalciferol 1000 UNITS tablet Take 1,000 Units by mouth daily.    . citalopram (CELEXA) 20 MG tablet Take 20 mg by mouth daily.    . clindamycin (CLEOCIN) 300 MG capsule Take by mouth.    . colestipol (COLESTID) 1 G tablet Take 1 g by mouth 2 (two) times daily.    Marland Kitchen losartan-hydrochlorothiazide (HYZAAR) 100-25 MG per tablet Take 1 tablet by mouth daily.    . meloxicam (MOBIC) 15 MG tablet Take 15 mg by mouth daily.    . Multiple Vitamin (MULTIVITAMIN) tablet Take 1 tablet by mouth daily.    . pantoprazole (PROTONIX) 40 MG tablet Take 40 mg by mouth daily.    . predniSONE (DELTASONE) 10 MG tablet   0  . vitamin A 10000 UNIT capsule Take 10,000 Units by mouth daily.    . vitamin B-12 (CYANOCOBALAMIN) 1000 MCG tablet Take 1,000 mcg by mouth daily.    . vitamin C (ASCORBIC ACID) 500 MG tablet Take 500 mg by mouth daily.     No current facility-administered medications for this visit.  Review of Systems Review of Systems  Constitutional: Negative.   Respiratory: Positive for cough.   Cardiovascular: Negative.     Blood pressure 106/68, pulse 64, resp. rate 12, height 5' 6"  (1.676 m), weight 162 lb (73.5 kg).  Physical Exam Physical Exam  Constitutional: She is oriented to person, place, and time. She appears well-developed and well-nourished.  Neurological: She is alert and oriented to person, place, and time.  Skin: Skin is warm and dry.    Data Reviewed Left breast ultrasound in the 11:00 position again confirmed a hypoechoic, irregular nodule in the 11:00 position, 7 cm from the nipple. It was elected to proceed to vacuum assisted biopsy. The area was cleansed with alcohol and 10 mL of 0.5% Xylocaine with 0.25% Marcaine with  1-200,000 of epinephrine was instilled without incident. Aspiration of the lesion showed a partial decrease in volume but persistent hyperechoic tissue around the lesion. It was elected to proceed to vacuum biopsy. BI-RADS-3.   The skin was cleansed with ChloraPrep. A 10-gauge Encor device was then passed from the lateral position through the lesion under ultrasound guidance with complete removal after obtaining 7 core samples. A postbiopsy clip was placed. The procedure was well tolerated. The skin defect was closed with benzoin and Steri-Strips followed by Telfa and Tegaderm dressing. The patient tolerated the procedure well.  Assessment    Left breast cystic mass, completely removed with vacuum biopsy.    Plan    The patient will be contacted when pathology is available in follow-up will be based on that report.     This information has been scribed by Gaspar Cola CMA.   Robert Bellow 06/04/2016, 9:55 PM

## 2016-07-16 ENCOUNTER — Other Ambulatory Visit: Payer: Self-pay | Admitting: Specialist

## 2016-07-16 DIAGNOSIS — R059 Cough, unspecified: Secondary | ICD-10-CM

## 2016-07-16 DIAGNOSIS — R05 Cough: Secondary | ICD-10-CM

## 2016-07-22 ENCOUNTER — Ambulatory Visit: Payer: Medicare Other

## 2016-07-22 ENCOUNTER — Ambulatory Visit
Admission: RE | Admit: 2016-07-22 | Discharge: 2016-07-22 | Disposition: A | Discharge status: Home / Self Care | Payer: Medicare Other | Source: Ambulatory Visit | Attending: Specialist | Admitting: Specialist

## 2016-07-22 DIAGNOSIS — K449 Diaphragmatic hernia without obstruction or gangrene: Secondary | ICD-10-CM | POA: Insufficient documentation

## 2016-07-22 DIAGNOSIS — M4312 Spondylolisthesis, cervical region: Secondary | ICD-10-CM | POA: Insufficient documentation

## 2016-07-22 DIAGNOSIS — R059 Cough, unspecified: Secondary | ICD-10-CM

## 2016-07-22 DIAGNOSIS — R05 Cough: Secondary | ICD-10-CM | POA: Diagnosis present

## 2016-07-22 DIAGNOSIS — K219 Gastro-esophageal reflux disease without esophagitis: Secondary | ICD-10-CM | POA: Diagnosis not present

## 2016-08-28 ENCOUNTER — Other Ambulatory Visit: Payer: Self-pay | Admitting: Physician Assistant

## 2016-08-28 DIAGNOSIS — Z1231 Encounter for screening mammogram for malignant neoplasm of breast: Secondary | ICD-10-CM

## 2016-09-03 ENCOUNTER — Encounter: Payer: Self-pay | Admitting: Pulmonary Disease

## 2016-09-03 ENCOUNTER — Ambulatory Visit (INDEPENDENT_AMBULATORY_CARE_PROVIDER_SITE_OTHER): Payer: Medicare Other | Admitting: Pulmonary Disease

## 2016-09-03 VITALS — BP 142/82 | HR 87 | Ht 66.5 in | Wt 165.0 lb

## 2016-09-03 DIAGNOSIS — K219 Gastro-esophageal reflux disease without esophagitis: Secondary | ICD-10-CM

## 2016-09-03 DIAGNOSIS — R05 Cough: Secondary | ICD-10-CM

## 2016-09-03 DIAGNOSIS — J310 Chronic rhinitis: Secondary | ICD-10-CM | POA: Diagnosis not present

## 2016-09-03 DIAGNOSIS — N393 Stress incontinence (female) (male): Secondary | ICD-10-CM | POA: Insufficient documentation

## 2016-09-03 DIAGNOSIS — R339 Retention of urine, unspecified: Secondary | ICD-10-CM | POA: Insufficient documentation

## 2016-09-03 DIAGNOSIS — R053 Chronic cough: Secondary | ICD-10-CM

## 2016-09-03 MED ORDER — BECLOMETHASONE DIPROPIONATE 80 MCG/ACT IN AERS: 1.0000 | INHALATION_SPRAY | Freq: Two times a day (BID) | RESPIRATORY_TRACT | 5 refills | Status: DC

## 2016-09-03 MED ORDER — FLUTICASONE PROPIONATE 50 MCG/ACT NA SUSP: 2.0000 | Freq: Every day | NASAL | 5 refills | Status: DC

## 2016-09-03 MED ORDER — RANITIDINE HCL 300 MG PO TABS: 300.0000 mg | ORAL_TABLET | Freq: Every day | ORAL | 5 refills | Status: DC

## 2016-09-03 NOTE — Progress Notes (Signed)
PULMONARY CONSULT NOTE  Requesting MD/Service: self Date of initial consultation: 09/03/16 Reason for consultation: chronic cough  PT PROFILE: 66 y.o. female never smoker with cough of 5 months duration  DATA: 06/23/16 Spirometry Raul Del): entirely normal. FVC 2.58 L, 83% of pred, FEV1 2.02, 82% of pred, FEV1 ratio 78% 07/22/16 Barium swallow: Small sliding hiatal hernia with moderate gastroesophageal reflux.  HPI:  She developed a classic URI with cough at the end of January. All symptoms have resolved except persistent daily cough which is mostly nonproductive but occasionally productive of scant discolored mucus. Her cough is worse later in the day or at bedtime and seems to be triggered by eating and talking. She reports hoarseness but denies HB symptoms. She denies DOE, CP, fever, hemoptysis, LE edema and calf tenderness. She has seen Dr Raul Del who performed spirometry which was normal. She was treated with azithromycin early in the course of this with modest temporary improvement. She was prescribed an albuterol inhaler which "helps a little". She has been prescribed prednisone without benefit. She has been prescribed Qvar inhaler which she is not using because she did not notice a benefit.   Past Medical History:  Diagnosis Date  . Arthritis   . Asthma   . Cancer ([place])    [phone] basal cell carcinoma  . GERD (gastroesophageal reflux disease)   . Hyperlipidemia   . Hypertension     Past Surgical History:  Procedure Laterality Date  . ABDOMINAL HYSTERECTOMY  1998  . bladder tack  [id number]  . BREAST BIOPSY Bilateral   . BREAST BIOPSY Left 1990  . BREAST CYST EXCISION  x3  . BREAST SURGERY Left 02/13/14   Fibrocystic changes, pseudo-angiomatous stromal hyperplasia. No atypia or malignancy.  . CARPAL TUNNEL RELEASE  x2  . CHOLECYSTECTOMY  2008  . COLONOSCOPY WITH PROPOFOL N/A 01/12/2015   Procedure: COLONOSCOPY WITH PROPOFOL;  Surgeon: Manya Silvas, MD;  Location:  [facility] ENDOSCOPY;  Service: Endoscopy;  Laterality: N/A;  . ESOPHAGOGASTRODUODENOSCOPY (EGD) WITH PROPOFOL N/A 01/12/2015   Procedure: ESOPHAGOGASTRODUODENOSCOPY (EGD) WITH PROPOFOL;  Surgeon: Manya Silvas, MD;  Location[facility] ENDOSCOPY;  Service: Endoscopy;  Laterality: N/A;  . NASAL RECONSTRUCTION  1983  . SAVORY DILATION N/A 01/12/2015   Procedure: SAVORY DILATION;  Surgeon: Manya Silvas, MD;  Location: [facility] ENDOSCOPY;  Service: Endoscopy;  Laterality: N/A;  . TONSILLECTOMY AND ADENOIDECTOMY  1956    MEDICATIONS: I have reviewed all medications and confirmed regimen as documented  Social History   Social History  . Marital status: Single    Spouse name: N/A  . Number of children: N/A  . Years of education: N/A   Occupational History  . Not on file.   Social History Main Topics  . Smoking status: Never Smoker  . Smokeless tobacco: Never Used  . Alcohol use No  . Drug use: No  . Sexual activity: Not on file   Other Topics Concern  . Not on file   Social History Narrative  . No narrative on file    Family History  Problem Relation Age of Onset  . Breast cancer Sister        age 21  . Cancer Paternal Grandmother        breast  . Cancer Cousin        breast  . Cancer Cousin        kidney    ROS: No fever, myalgias/arthralgias, unexplained weight loss or weight gain No new focal weakness or sensory deficits  No otalgia, hearing loss, visual changes, nasal and sinus symptoms, mouth and throat problems No neck pain or adenopathy No abdominal pain, N/V/D, diarrhea, change in bowel pattern No dysuria, change in urinary pattern   Vitals:   09/03/16 0901 09/03/16 0906  BP:  (!) 142/82  Pulse:  87  SpO2:  97%  Weight: 165 lb (74.8 kg)   Height: 5' 6.5" (1.689 m)      EXAM:  Gen: WDWN, NAD HEENT: NCAT, sclera white, oropharynx normal, mild-mod rhinitis Neck: Supple without LAN, thyromegaly, JVD Lungs: breath sounds full without wheezes or other  adventitious sounds Cardiovascular: Reg, no murmurs Abdomen: Soft, nontender, normal BS Ext: without clubbing, cyanosis, edema Neuro: CNs grossly intact, motor and sensory intact Skin: Limited exam, no lesions noted  DATA:   No flowsheet data found.  No flowsheet data found.  CXR report (05/16/16):  NACPD  IMPRESSION:     ICD-10-CM   1. Chronic cough R05   2. Chronic rhinitis J31.0   3. Gastroesophageal reflux disease, esophagitis presence not specified K21.9   4. Laryngopharyngeal reflux (LPR) K21.9    The onset seems to clearly be related to an URI - probably viral - which is probably no longer active. It is not clear what is now propagating chronic cough. She has findings of rhinitis on exam and HH/GERD noted on esophagram. Therefore, suspect these are contributors. She has not given Qvar a sufficient trial  PLAN:  Resume Qvar - 2 inhalations BID Begin Flonase - 2 sprays per nostril daily Continue Protonix 40 mg daily Add Zantac 300 mg one hour before bedtime ROV 4-6 weeks  Merton Border, MD PCCM service Mobile [phone] Pager [phone] 09/03/2016 3:14 PM

## 2016-09-03 NOTE — Patient Instructions (Addendum)
Double Qvar inhaler to 2 puffs twice a day Begin Flonase - 2 sprays per nostril daily Continue Protonix 40 mg daily Add Zantac 300 mg one hour before bedtime  Follow up in 4-6 weeks

## 2016-09-15 ENCOUNTER — Ambulatory Visit
Admission: RE | Admit: 2016-09-15 | Discharge: 2016-09-15 | Disposition: A | Discharge status: Home / Self Care | Payer: Medicare Other | Source: Ambulatory Visit | Attending: Physician Assistant | Admitting: Physician Assistant

## 2016-09-15 ENCOUNTER — Telehealth: Payer: Self-pay | Admitting: *Deleted

## 2016-09-15 DIAGNOSIS — Z1231 Encounter for screening mammogram for malignant neoplasm of breast: Secondary | ICD-10-CM

## 2016-09-15 NOTE — Telephone Encounter (Signed)
Patient called to let you know she was having her mammogram completed this afternoon. Thank you

## 2016-09-19 ENCOUNTER — Other Ambulatory Visit: Payer: Self-pay | Admitting: Physician Assistant

## 2016-09-19 DIAGNOSIS — R0602 Shortness of breath: Secondary | ICD-10-CM

## 2016-09-19 DIAGNOSIS — R053 Chronic cough: Secondary | ICD-10-CM

## 2016-09-19 DIAGNOSIS — R05 Cough: Secondary | ICD-10-CM

## 2016-09-22 ENCOUNTER — Ambulatory Visit (INDEPENDENT_AMBULATORY_CARE_PROVIDER_SITE_OTHER): Payer: Medicare Other | Admitting: Podiatry

## 2016-09-22 DIAGNOSIS — L600 Ingrowing nail: Secondary | ICD-10-CM | POA: Diagnosis not present

## 2016-09-22 NOTE — Progress Notes (Signed)
She presents today for chief complaint of a possible ingrown toenail to the fourth digit left foot times about 3 weeks. States that the sore to touch but has been somewhat better recently but concern toenails getting thicker.  Objective: Vital signs are stable she is alert and oriented 3 pulses are palpable. Neurologic sensorium is intact. Deep tendon reflexes are intact. Muscle strength was 5 over 5 dorsiflexion plantar flexors and inverters everters all of his musculature is intact. Orthopedic evaluation Mr. is all joints distal to the ankle for range of motion with crepitation. Mild hammertoe deformity with adductovarus rotation fourth digit left foot. There appears to be sharp incurvated nail margin along the fibular border of the fourth digit left foot which is tender on palpation.  Assessment: Ingrown nail fourth digit left foot.  Plan: Perform chemical matrixectomy today she tolerated this procedure well after local anesthesia was a [employer]. She was provided with both oral and written home-going instructions for care and second toe as well as prescription for corkscrew noted to be applied twice daily. Follow-up with her in 1-2 weeks for reevaluation.

## 2016-09-22 NOTE — Patient Instructions (Signed)

## 2016-09-23 ENCOUNTER — Encounter: Payer: Self-pay | Admitting: General Surgery

## 2016-09-23 ENCOUNTER — Ambulatory Visit
Admission: RE | Admit: 2016-09-23 | Discharge: 2016-09-23 | Disposition: A | Discharge status: Home / Self Care | Payer: Medicare Other | Source: Ambulatory Visit | Attending: Physician Assistant | Admitting: Physician Assistant

## 2016-09-23 DIAGNOSIS — R911 Solitary pulmonary nodule: Secondary | ICD-10-CM | POA: Diagnosis not present

## 2016-09-23 DIAGNOSIS — R0602 Shortness of breath: Secondary | ICD-10-CM | POA: Insufficient documentation

## 2016-09-23 DIAGNOSIS — I313 Pericardial effusion (noninflammatory): Secondary | ICD-10-CM | POA: Insufficient documentation

## 2016-09-23 DIAGNOSIS — R053 Chronic cough: Secondary | ICD-10-CM

## 2016-09-23 DIAGNOSIS — K760 Fatty (change of) liver, not elsewhere classified: Secondary | ICD-10-CM | POA: Insufficient documentation

## 2016-09-23 DIAGNOSIS — I700 Atherosclerosis of aorta: Secondary | ICD-10-CM | POA: Diagnosis not present

## 2016-09-23 DIAGNOSIS — K449 Diaphragmatic hernia without obstruction or gangrene: Secondary | ICD-10-CM | POA: Diagnosis not present

## 2016-09-23 DIAGNOSIS — R05 Cough: Secondary | ICD-10-CM | POA: Insufficient documentation

## 2016-09-23 MED ORDER — IOPAMIDOL (ISOVUE-300) INJECTION 61%
75.0000 mL | Freq: Once | INTRAVENOUS | Status: AC | PRN
  Administered 2016-09-23: 75 mL via INTRAVENOUS

## 2016-10-06 ENCOUNTER — Ambulatory Visit: Payer: Medicare Other | Admitting: Podiatry

## 2016-10-08 ENCOUNTER — Ambulatory Visit (INDEPENDENT_AMBULATORY_CARE_PROVIDER_SITE_OTHER): Payer: Self-pay | Admitting: Podiatry

## 2016-10-08 DIAGNOSIS — L600 Ingrowing nail: Secondary | ICD-10-CM

## 2016-10-08 NOTE — Progress Notes (Signed)
She presents today 1 week follow-up ingrown toenail fourth left. States it seems to be doing very well since she's been soaking it looks like there is a scab present.  Objective: Vital signs are stable alert and oriented 3. Pulses are palpable. No open lesions or wounds. Fibular border of the matrixectomy site fourth digit left appears to be healing very nicely. His no signs of infection.  Assessment: Healing surgical toe fourth left.  Plan: Follow up with me on an as-needed basis.

## 2016-10-14 ENCOUNTER — Encounter: Payer: Self-pay | Admitting: Pulmonary Disease

## 2016-10-14 ENCOUNTER — Ambulatory Visit (INDEPENDENT_AMBULATORY_CARE_PROVIDER_SITE_OTHER): Payer: Medicare Other | Admitting: Pulmonary Disease

## 2016-10-14 VITALS — BP 136/86 | HR 80 | Resp 16 | Ht 66.5 in | Wt 165.0 lb

## 2016-10-14 DIAGNOSIS — K219 Gastro-esophageal reflux disease without esophagitis: Secondary | ICD-10-CM | POA: Diagnosis not present

## 2016-10-14 DIAGNOSIS — J310 Chronic rhinitis: Secondary | ICD-10-CM | POA: Diagnosis not present

## 2016-10-14 DIAGNOSIS — R05 Cough: Secondary | ICD-10-CM

## 2016-10-14 DIAGNOSIS — R053 Chronic cough: Secondary | ICD-10-CM

## 2016-10-14 MED ORDER — BENZONATATE 100 MG PO CAPS: 200.0000 mg | ORAL_CAPSULE | Freq: Four times a day (QID) | ORAL | 1 refills | Status: DC | PRN

## 2016-10-14 MED ORDER — HYDROCOD POLST-CPM POLST ER 10-8 MG/5ML PO SUER: 5.0000 mL | Freq: Two times a day (BID) | ORAL | 0 refills | Status: DC | PRN

## 2016-10-14 NOTE — Patient Instructions (Signed)
-   Discontinue Qvar - Continue Flonase - Continue Protonix and Zantac as previously prescribed - Tussionex - 1 teaspoon twice a day for 3 days, then 1 teaspoon every 12 hours as needed - Tessalon Perles 1 tablet every 6 hours as needed - Follow-up in 4-6 weeks

## 2016-10-14 NOTE — Progress Notes (Signed)
PULMONARY OFFICE FOLLOW UP NOTE  Requesting MD/Service: self Date of initial consultation: 09/03/16 Reason for consultation: chronic cough  PT PROFILE: 66 y.o. female never smoker with cough of 5 months duration  DATA: 06/23/16 Spirometry Raul Del): entirely normal. FVC 2.58 L, 83% of pred, FEV1 2.02, 82% of pred, FEV1 ratio 78% 07/22/16 Barium swallow: Small sliding hiatal hernia with moderate gastroesophageal reflux.  INTERVAL: No major events  SUBJ:  Routine reevaluation for chronic cough. Overall, feels she is "a little better". She did a three-week trial off of losartan with no improvement in her cough. Therefore, she resume this medication. She still has daily cough, mostly nonproductive. Tends to be worse in the evening and exacerbated by talking and after eating. Denies CP, fever, purulent sputum, hemoptysis, LE edema and calf tenderness.   Vitals:   10/14/16 0949 10/14/16 0951  BP:  136/86  Pulse:  80  Resp: 16   SpO2:  96%  Weight: 165 lb (74.8 kg)   Height: 5' 6.5" (1.689 m)      EXAM:  Gen: NAD HEENT: NCAT, minimal changes of rhinitis Neck: No LA and or JVD Lungs: breath sounds full without wheezes  Cardiovascular: Reg, no murmurs Abdomen: Soft, nontender, normal BS Ext: without clubbing, cyanosis, edema Neuro: No focal deficits noted   DATA:   No flowsheet data found.  No flowsheet data found.  CXR:  No new film  IMPRESSION:     ICD-10-CM   1. Chronic cough - multifactorial. Suspect a cyclical component. R05   2. Chronic rhinitis - improved on exam today. J31.0   3. Gastroesophageal reflux disease K21.9      PLAN:  - Discontinue Qvar - Continue Flonase - Continue Protonix and Zantac as previously prescribed - Tussionex - 1 teaspoon twice a day for 3 days, then 1 teaspoon every 12 hours as needed - Tessalon Perles 1 tablet every 6 hours as needed - Follow-up in 4-6 weeks  Merton Border, MD PCCM service Mobile [phone] Pager  [phone] 10/14/2016 10:26 AM

## 2016-10-15 ENCOUNTER — Ambulatory Visit
Admission: RE | Admit: 2016-10-15 | Discharge: 2016-10-15 | Disposition: A | Discharge status: Home / Self Care | Payer: Medicare Other | Source: Ambulatory Visit | Attending: Unknown Physician Specialty | Admitting: Unknown Physician Specialty

## 2016-10-15 ENCOUNTER — Other Ambulatory Visit: Payer: Self-pay | Admitting: Unknown Physician Specialty

## 2016-10-15 DIAGNOSIS — R05 Cough: Secondary | ICD-10-CM | POA: Insufficient documentation

## 2016-10-15 DIAGNOSIS — R059 Cough, unspecified: Secondary | ICD-10-CM

## 2016-12-01 ENCOUNTER — Ambulatory Visit (INDEPENDENT_AMBULATORY_CARE_PROVIDER_SITE_OTHER): Payer: Medicare Other | Admitting: Pulmonary Disease

## 2016-12-01 ENCOUNTER — Encounter: Payer: Self-pay | Admitting: Pulmonary Disease

## 2016-12-01 VITALS — BP 118/70 | HR 71 | Ht 66.5 in

## 2016-12-01 DIAGNOSIS — R05 Cough: Secondary | ICD-10-CM

## 2016-12-01 DIAGNOSIS — R053 Chronic cough: Secondary | ICD-10-CM

## 2016-12-01 NOTE — Patient Instructions (Signed)
Continue current medical therapies without change   Try Delsym or equivalent (dextromethorphan) as a cough suppressant  We will make a referral to [facility] for possible enrollment in their chronic cough study  Follow up here in 3 months

## 2016-12-04 NOTE — Progress Notes (Signed)
PULMONARY OFFICE FOLLOW UP NOTE  Requesting MD/Service: self Date of initial consultation: 09/03/16 Reason for consultation: chronic cough  PT PROFILE: 66 y.o. female never smoker with cough of 5 months duration  DATA: 06/23/16 Spirometry Raul Del): entirely normal. FVC 2.58 L, 83% of pred, FEV1 2.02, 82% of pred, FEV1 ratio 78% 07/22/16 Barium swallow: Small sliding hiatal hernia with moderate gastroesophageal reflux.  INTERVAL: No major events  SUBJ:  Routine reevaluation for chronic cough. Her cough is improved, approximately 50% better. She did not take the Tussionex as previously prescribed. She uses [employer] which helped her cough. She is using these 0-2 times per day as needed. Her cough remains nonproductive. It is triggered by cold air, after meals and talking. Denies CP, fever, purulent sputum, hemoptysis, LE edema and calf tenderness.   Vitals:   12/01/16 1500 12/01/16 1505  BP:  118/70  Pulse:  71  SpO2:  94%  Height: 5' 6.5" (1.689 m)      EXAM:  Gen: NAD HEENT: WNL Neck: No LAN and or JVD Lungs: breath sounds full without wheezes  Cardiovascular: Reg, no murmurs Abdomen: Soft, nontender, normal BS Ext: without clubbing, cyanosis, edema Neuro: No focal deficits noted   DATA:   No flowsheet data found.  No flowsheet data found.  CXR:  No new film  IMPRESSION:     ICD-10-CM   1. Chronic cough R05      PLAN:  - Continue Flonase - Continue Protonix and Zantac as previously prescribed - Continue Tessalon Perles 1 tablet every 6 hours as needed - Suggested Delsym as needed - Referral [facility] pulmonary medicine for enrollment in their chronic cough study - Follow-up in 3 months  Merton Border, MD PCCM service Mobile [phone] Pager [phone] 12/04/2016 4:05 AM

## 2017-03-10 HISTORY — PX: MASTECTOMY: SHX3

## 2017-03-31 ENCOUNTER — Other Ambulatory Visit: Payer: Self-pay | Admitting: Physician Assistant

## 2017-03-31 DIAGNOSIS — N6452 Nipple discharge: Secondary | ICD-10-CM

## 2017-04-10 ENCOUNTER — Ambulatory Visit
Admission: RE | Admit: 2017-04-10 | Discharge: 2017-04-10 | Disposition: A | Discharge status: Home / Self Care | Payer: Medicare Other | Source: Ambulatory Visit | Attending: Physician Assistant | Admitting: Physician Assistant

## 2017-04-10 DIAGNOSIS — N6452 Nipple discharge: Secondary | ICD-10-CM

## 2017-04-10 DIAGNOSIS — N6310 Unspecified lump in the right breast, unspecified quadrant: Secondary | ICD-10-CM | POA: Diagnosis not present

## 2017-04-10 DIAGNOSIS — N6001 Solitary cyst of right breast: Secondary | ICD-10-CM | POA: Diagnosis not present

## 2017-04-14 ENCOUNTER — Ambulatory Visit (INDEPENDENT_AMBULATORY_CARE_PROVIDER_SITE_OTHER): Payer: Medicare Other | Admitting: General Surgery

## 2017-04-14 ENCOUNTER — Encounter: Payer: Self-pay | Admitting: General Surgery

## 2017-04-14 VITALS — BP 128/72 | HR 85 | Resp 14 | Ht 66.5 in | Wt 162.0 lb

## 2017-04-14 DIAGNOSIS — R928 Other abnormal and inconclusive findings on diagnostic imaging of breast: Secondary | ICD-10-CM | POA: Insufficient documentation

## 2017-04-14 NOTE — Progress Notes (Addendum)
Patient ID: Mariah Hogan, female   DOB: 1950/06/19, 67 y.o.   MRN: [id number]  Chief Complaint  Patient presents with  . Other    HPI Mariah Hogan is a 67 y.o. female.  Here for right nipple discharge, noticed Mar 30 2017. She denies pain and can not feel anything different in the breast. She states that Apr 03 2017 an oily greasy area was underneath her right breast and on her right shoulder. She went to [facility] and saw Dr Kary Kos. Right breast mammogram and right breast ultrasound was 04-10-17  She is recovering from a sinus infection/bronchitis and still has a cough.  HPI  Past Medical History:  Diagnosis Date  . Arthritis   . Asthma   . Cancer ([place])    [phone] basal cell carcinoma  . GERD (gastroesophageal reflux disease)   . Hyperlipidemia   . Hypertension     Past Surgical History:  Procedure Laterality Date  . ABDOMINAL HYSTERECTOMY  1998  . bladder tack  [id number],9381  . BREAST BIOPSY Bilateral   . BREAST BIOPSY Left 1990  . BREAST BIOPSY Left 2018   core bx- neg  . BREAST CYST EXCISION  x3  . BREAST SURGERY Left 02/13/14   Fibrocystic changes, pseudo-angiomatous stromal hyperplasia. No atypia or malignancy.  . CARPAL TUNNEL RELEASE  x2  . CHOLECYSTECTOMY  2008  . COLONOSCOPY WITH PROPOFOL N/A 01/12/2015   Procedure: COLONOSCOPY WITH PROPOFOL;  Surgeon: Manya Silvas, MD;  Location: [facility] ENDOSCOPY;  Service: Endoscopy;  Laterality: N/A;  . ESOPHAGOGASTRODUODENOSCOPY (EGD) WITH PROPOFOL N/A 01/12/2015   Procedure: ESOPHAGOGASTRODUODENOSCOPY (EGD) WITH PROPOFOL;  Surgeon: Manya Silvas, MD;  Location: [facility] ENDOSCOPY;  Service: Endoscopy;  Laterality: N/A;  . NASAL RECONSTRUCTION  1983  . SAVORY DILATION N/A 01/12/2015   Procedure: SAVORY DILATION;  Surgeon: Manya Silvas, MD;  Location[facility] ENDOSCOPY;  Service: Endoscopy;  Laterality: N/A;  . TONSILLECTOMY AND ADENOIDECTOMY  1956    Family History  Problem Relation Age of Onset  . Breast cancer  Sister        age 52  . Cancer Paternal Grandmother        breast  . Cancer Cousin        breast  . Cancer Cousin        kidney    Social History Social History   Tobacco Use  . Smoking status: Never Smoker  . Smokeless tobacco: Never Used  Substance Use Topics  . Alcohol use: No    Alcohol/week: 0.0 oz  . Drug use: No    Allergies  Allergen Reactions  . Penicillin G Rash, Shortness Of Breath and Swelling  . Fluocinonide   . Other Other (See Comments)    Dodecyl gallate: Positive patch test  Elta MD UV Clear SPF 46: Positive patch test  fragrance dodocylgallate dodocylgallate  . Parabens Other (See Comments)    Positive patch test   . Penicillins   . Shellac   . Triamcinolone Acetonide   . Triamcinolone Rash    Current Outpatient Medications  Medication Sig Dispense Refill  . atenolol (TENORMIN) 50 MG tablet Take 50 mg by mouth daily.    . benzonatate (TESSALON PERLES) 100 MG capsule Take 2 capsules (200 mg total) by mouth every 6 (six) hours as needed for cough. 60 capsule 1  . cetirizine (ZYRTEC) 10 MG tablet Take 10 mg by mouth daily.    . Cholecalciferol 1000 UNITS tablet Take 1,000 Units by mouth daily.    Marland Kitchen  citalopram (CELEXA) 20 MG tablet Take 20 mg by mouth daily.    . colestipol (COLESTID) 1 G tablet Take 1 g by mouth as needed.     . fluticasone (FLONASE) 50 MCG/ACT nasal spray Place 2 sprays into both nostrils daily. 16 g 5  . meloxicam (MOBIC) 15 MG tablet Take 15 mg by mouth daily.    . Multiple Vitamin (MULTIVITAMIN) tablet Take 1 tablet by mouth daily.    . pantoprazole (PROTONIX) 40 MG tablet Take 40 mg by mouth daily.    Marland Kitchen PREMARIN vaginal cream Place 1 application vaginally once a week.  2  . ranitidine (ZANTAC) 300 MG tablet Take 1 tablet (300 mg total) by mouth at bedtime. 30 tablet 5  . triamterene-hydrochlorothiazide (MAXZIDE-25) 37.5-25 MG tablet Take 1 tablet by mouth daily.    . vitamin A 10000 UNIT capsule Take 10,000 Units by mouth  daily.    . vitamin B-12 (CYANOCOBALAMIN) 1000 MCG tablet Take 1,000 mcg by mouth daily.    . vitamin C (ASCORBIC ACID) 500 MG tablet Take 500 mg by mouth daily.     No current facility-administered medications for this visit.     Review of Systems Review of Systems  Constitutional: Negative.   Respiratory: Positive for cough.   Cardiovascular: Negative.     Blood pressure 128/72, pulse 85, resp. rate 14, height 5' 6.5" (1.689 m), weight 162 lb (73.5 kg), SpO2 97 %.  Physical Exam Physical Exam  Constitutional: She is oriented to person, place, and time. She appears well-developed and well-nourished.  HENT:  Mouth/Throat: Oropharynx is clear and moist.  Eyes: Conjunctivae are normal. No scleral icterus.  Neck: Neck supple.  Cardiovascular: Normal rate, regular rhythm and normal heart sounds.  Pulmonary/Chest: Effort normal and breath sounds normal. Right breast exhibits no inverted nipple, no mass, no nipple discharge, no skin change and no tenderness. Left breast exhibits no inverted nipple, no mass, no nipple discharge, no skin change and no tenderness.  Prolonged expiratory phase in all lung fields without frank wheezing. No rales/ rhonchi.   Lymphadenopathy:    She has no cervical adenopathy.    She has no axillary adenopathy.  Neurological: She is alert and oriented to person, place, and time.  Skin: Skin is warm and dry.  Psychiatric: Her behavior is normal.    Data Reviewed April 10, 2017 right breast diagnostic mammogram and ultrasound reviewed.  Simple cyst identified is 1 of to mammographic densities, the other felt to be benign.  Ultrasound was negative.  Assessment    No documented nipple drainage, with unexplained greasy wetness involving the chest wall above the breast, the upper abdominal wall below the breast and on one occasion the breast itself.    Plan    I do not believe any intervention is required at this time and less documented nipple drainage is  identified.  The mammograms have been reviewed and this recommendation stands.  Would arrange for diagnostic mammograms in July 2019 with office visit to follow, earlier if new problems develop.  Observation for now. Review mammograms. The patient is aware to call back for any questions, concerns or actual nipple discharge.      HPI, Physical Exam, Assessment and Plan have been scribed under the direction and in the presence of Robert Bellow, MD. Karie Fetch, RN   I have completed the exam and reviewed the above documentation for accuracy and completeness.  I agree with the above.  Haematologist has been used  and any errors in dictation or transcription are unintentional.  Hervey Ard, M.D., F.A.C.S.  Forest Gleason Anquan Azzarello 04/15/2017, 7:07 AM

## 2017-04-14 NOTE — Patient Instructions (Signed)
The patient is aware to call back for any questions, concerns or actual nipple discharge .

## 2017-04-15 ENCOUNTER — Telehealth: Payer: Self-pay | Admitting: *Deleted

## 2017-04-15 ENCOUNTER — Encounter: Payer: Self-pay | Admitting: *Deleted

## 2017-04-15 NOTE — Telephone Encounter (Signed)
Notified patient as instructed, patient pleased. Discussed follow-up appointments, patient agrees  

## 2017-04-15 NOTE — Telephone Encounter (Signed)
-----   Message from Robert Bellow, MD sent at 04/14/2017  7:24 PM EST ----- Please notify the patient's of the mammograms were reviewed.  Vision is a simple cyst which requires no further observation and another was a density that is appropriate to reassess in 6 months.  Our office should arrange for bilateral diagnostic mammograms in July with an office visit to follow.  Thank you

## 2017-07-27 ENCOUNTER — Other Ambulatory Visit: Payer: Self-pay

## 2017-07-27 DIAGNOSIS — R928 Other abnormal and inconclusive findings on diagnostic imaging of breast: Secondary | ICD-10-CM

## 2017-08-04 ENCOUNTER — Ambulatory Visit (INDEPENDENT_AMBULATORY_CARE_PROVIDER_SITE_OTHER): Payer: Medicare Other | Admitting: General Surgery

## 2017-08-04 ENCOUNTER — Encounter: Payer: Self-pay | Admitting: General Surgery

## 2017-08-04 ENCOUNTER — Ambulatory Visit: Payer: Self-pay

## 2017-08-04 VITALS — BP 124/64 | HR 88 | Resp 18 | Ht 66.5 in | Wt 163.0 lb

## 2017-08-04 DIAGNOSIS — N6321 Unspecified lump in the left breast, upper outer quadrant: Secondary | ICD-10-CM

## 2017-08-04 DIAGNOSIS — C50919 Malignant neoplasm of unspecified site of unspecified female breast: Secondary | ICD-10-CM

## 2017-08-04 HISTORY — DX: Malignant neoplasm of unspecified site of unspecified female breast: C50.919

## 2017-08-04 HISTORY — PX: BREAST BIOPSY: SHX20

## 2017-08-04 NOTE — Progress Notes (Signed)
Patient ID: Mariah Hogan, female   DOB: 01/17/1951, 67 y.o.   MRN: [id number]  Chief Complaint  Patient presents with  . Follow-up    HPI Mariah Hogan is a 67 y.o. female.  Here for evaluation of a known left breast mass, she feels it is getting larger and tender.  She has noticed this more as her now 47-monthold grandson prefers to rest his head on the left side rather than the right side.  She is been caring for him more as he has had several illnesses since starting daycare including RSV.  The patient underwent core biopsy of a left upper inner quadrant mass in March 2018.  She is enjoying retirement and keeping her grandson some as well.  HPI  Past Medical History:  Diagnosis Date  . Arthritis   . Asthma   . [place]    [phone] cell carcinoma  . GERD (gastroesophageal reflux disease)   . Hyperlipidemia   . Hypertension     Past Surgical History:  Procedure Laterality Date  . ABDOMINAL HYSTERECTOMY  1998  . bladder tack  [id number] . BREAST BIOPSY Bilateral   . BREAST BIOPSY Left 1990  . BREAST BIOPSY Left 2018   core bx- neg  . BREAST CYST EXCISION  x3  . BREAST SURGERY Left 02/13/14   Fibrocystic changes, pseudo-angiomatous stromal hyperplasia. No atypia or malignancy.  . CARPAL TUNNEL RELEASE  x2  . CHOLECYSTECTOMY  2008  . COLONOSCOPY WITH PROPOFOL N/A 01/12/2015   Procedure: COLONOSCOPY WITH PROPOFOL;  Surgeon: RManya Silvas MD;  [facility];  Service: Endoscopy;  Laterality: N/A;  . ESOPHAGOGASTRODUODENOSCOPY (EGD) WITH PROPOFOL N/A 01/12/2015   Procedure: ESOPHAGOGASTRODUODENOSCOPY (EGD) WITH PROPOFOL;  Surgeon: RManya Silvas MD;  [facility];  Service: Endoscopy;  Laterality: N/A;  . NASAL RECONSTRUCTION  1983  . SAVORY DILATION N/A 01/12/2015   Procedure: SAVORY DILATION;  Surgeon: RManya Silvas MD;  [facility];  Service: Endoscopy;  Laterality: N/A;  . TONSILLECTOMY AND ADENOIDECTOMY  1956     Family History  Problem Relation Age of Onset  . Breast cancer Sister        age 69 . Cancer Paternal Grandmother        breast  . Cancer Cousin        breast  . Cancer Cousin        kidney    Social History Social History   Tobacco Use  . Smoking status: Never Smoker  . Smokeless tobacco: Never Used  Substance Use Topics  . Alcohol use: No    Alcohol/week: 0.0 oz  . Drug use: No    Allergies  Allergen Reactions  . Penicillin G Rash, Shortness Of Breath and Swelling  . Fluocinonide   . Other Other (See Comments)    Dodecyl gallate: Positive patch test  Elta MD UV Clear SPF 46: Positive patch test  fragrance dodocylgallate dodocylgallate  . Parabens Other (See Comments)    Positive patch test   . Penicillins   . Shellac   . Triamcinolone Acetonide   . Triamcinolone Rash    Current Outpatient Medications  Medication Sig Dispense Refill  . atenolol (TENORMIN) 50 MG tablet Take 50 mg by mouth daily.    . benzonatate (TESSALON PERLES) 100 MG capsule Take 2 capsules (200 mg total) by mouth every 6 (six) hours as needed for cough. 60 capsule 1  . cetirizine (ZYRTEC) 10 MG tablet Take 10 mg  by mouth daily.    . Cholecalciferol 1000 UNITS tablet Take 1,000 Units by mouth daily.    . citalopram (CELEXA) 20 MG tablet Take 20 mg by mouth daily.    . colestipol (COLESTID) 1 G tablet Take 1 g by mouth as needed.     . fluticasone (FLONASE) 50 MCG/ACT nasal spray Place 2 sprays into both nostrils daily. (Patient taking differently: Place 2 sprays into both nostrils as needed. ) 16 g 5  . meloxicam (MOBIC) 15 MG tablet Take 15 mg by mouth daily.    . Multiple Vitamin (MULTIVITAMIN) tablet Take 1 tablet by mouth daily.    . pantoprazole (PROTONIX) 40 MG tablet Take 40 mg by mouth daily.    Marland Kitchen PREMARIN vaginal cream Place 1 application vaginally once a week.  2  . ranitidine (ZANTAC) 300 MG tablet Take 1 tablet (300 mg total) by mouth at bedtime. 30 tablet 5  .  triamterene-hydrochlorothiazide (MAXZIDE-25) 37.5-25 MG tablet Take 1 tablet by mouth daily.    . vitamin A 10000 UNIT capsule Take 10,000 Units by mouth daily.    . vitamin B-12 (CYANOCOBALAMIN) 1000 MCG tablet Take 1,000 mcg by mouth daily.    . vitamin C (ASCORBIC ACID) 500 MG tablet Take 500 mg by mouth daily.     No current facility-administered medications for this visit.     Review of Systems Review of Systems  Constitutional: Negative.   Respiratory: Negative.   Cardiovascular: Negative.     Blood pressure 124/64, pulse 88, resp. rate 18, height 5' 6.5" (1.689 m), weight 163 lb (73.9 kg).  Physical Exam Physical Exam  Constitutional: She is oriented to person, place, and time. She appears well-developed and well-nourished.  HENT:  Mouth/Throat: Oropharynx is clear and moist.  Eyes: Conjunctivae are normal. No scleral icterus.  Neck: Neck supple.  Cardiovascular: Normal rate, regular rhythm and normal heart sounds.  Pulmonary/Chest: Effort normal and breath sounds normal. Right breast exhibits no inverted nipple, no mass, no nipple discharge, no skin change and no tenderness. Left breast exhibits mass. Left breast exhibits no inverted nipple, no nipple discharge, no skin change and no tenderness.  Left breast > right breast. LUO quadrant 2 o'clock 7 CFN 2 cm nodule    Lymphadenopathy:    She has no cervical adenopathy.    She has no axillary adenopathy.  Neurological: She is alert and oriented to person, place, and time.  Skin: Skin is warm and dry.  Psychiatric: Her behavior is normal.    Data Reviewed September 15, 2016 bilateral screening mammograms reviewed.  No abnormality in the area of today's clinical concern.  BI-RADS-1.  Ultrasound examination of the palpable mass in the upper outer quadrant of the left breast in the 2 o'clock position, 7 cm from the nipple showed a multilobulated hypoechoic mass with posterior acoustic enhancement during 1.65 x 1.69 x 1.86 cm.   This is wider than tall.  Scanning through the axilla shows no discernible adenopathy.  BI-RADS-4.  The patient was amenable to core biopsy.  10 cc of 0.5% Xylocaine with 0.25% Marcaine with 1 to 200,000 units of epinephrine was utilized and well-tolerated.  ChloraPrep was applied to the skin.  11 mm blade was used followed by the 14-gauge Bard Finesse biopsy device.  8 core samples were taken from 2 locations through the mass.  A postbiopsy clip was placed.  Skin defect was closed with benzoin, Steri-Strip followed by Telfa and Tegaderm dressing.  The procedure was well-tolerated.  Assessment    New left breast upper outer quadrant mass.    Plan    The patient will be contacted when biopsy results are available.  She is instructed regards to wound care by the nurse.  Follow up based on upcoming biopsy results.     HPI, Physical Exam, Assessment and Plan have been scribed under the direction and in the presence of Robert Bellow, MD. Karie Fetch, RN  I have completed the exam and reviewed the above documentation for accuracy and completeness.  I agree with the above.  Haematologist has been used and any errors in dictation or transcription are unintentional.  Hervey Ard, M.D., F.A.C.S.   Forest Gleason Zilla Shartzer 08/04/2017, 10:34 AM

## 2017-08-04 NOTE — Patient Instructions (Addendum)
The patient is aware to call back for any questions or concerns.     CARE AFTER BREAST BIOPSY  1. Leave the dressing on that your doctor applied after the biopsy. It is waterproof. You may bathe, shower and/or swim. The dressing can be removed in 3 days, you will see small strips of tape against your skin on the incision. Do not remove these strips they will gradually fall off in about 2-3 weeks. You may use an ice pack on and off for the first 12-24 hours for comfort.  2. You may want to use a gauze,cloth or similar protection in your bra to prevent rubbing against your dressing and incision. This is not necessary, but you may feel more comfortable doing so.  3. It is recommended that you wear a bra day and night to give support to the breast. This will prevent the weight of the breast from pulling on the incision.  4. Your breast may feel hard and lumpy under the incision. Do not be alarmed. This is the underlying stitching of tissue. Softening of this tissue will occur in time.  5. You may have a follow up appointment or phone follow up in one week after your biopsy. The office phone number is [phone].  6. You will notice about a week or two after your office visit that the strips of the tape on your incision will begin to loosen. These may then be removed.  7. Report to your doctor any of the following:  * Severe pain not relieved by your pain medication  *Redness of the incision  * Drainage from the incision  *Fever greater than 101 degrees

## 2017-08-05 ENCOUNTER — Encounter: Payer: Self-pay | Admitting: *Deleted

## 2017-08-05 ENCOUNTER — Ambulatory Visit (INDEPENDENT_AMBULATORY_CARE_PROVIDER_SITE_OTHER): Payer: Medicare Other | Admitting: General Surgery

## 2017-08-05 VITALS — BP 138/74 | HR 84 | Resp 12 | Ht 66.5 in | Wt 166.0 lb

## 2017-08-05 DIAGNOSIS — C50912 Malignant neoplasm of unspecified site of left female breast: Secondary | ICD-10-CM

## 2017-08-05 NOTE — Progress Notes (Signed)
Patient ID: Mariah Hogan, female   DOB: 1951/02/24, 67 y.o.   MRN: [id number]  Chief Complaint  Patient presents with  . Follow-up    HPI Mariah Hogan is a 67 y.o. female.  Here today for follow up from left breast biopsy. Sh eis here with her daughter, Mariah Hogan.  HPI  Past Medical History:  Diagnosis Date  . Arthritis   . Asthma   . 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  . Cancer ([place])    [id number] basal cell carcinoma  . GERD (gastroesophageal reflux disease)   . Hyperlipidemia   . Hypertension     Past Surgical History:  Procedure Laterality Date  . ABDOMINAL HYSTERECTOMY  1998  . bladder tack  [id number]  . BREAST BIOPSY Bilateral   . BREAST BIOPSY Left 1990  . BREAST BIOPSY Left 2018   core bx- neg  . 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  Marland Kitchen BREAST CYST EXCISION  x3  . BREAST SURGERY Left 02/13/14   Fibrocystic changes, pseudo-angiomatous stromal hyperplasia. No atypia or malignancy.  . CARPAL TUNNEL RELEASE  x2  . CHOLECYSTECTOMY  2008  . COLONOSCOPY WITH PROPOFOL N/A 01/12/2015   Procedure: COLONOSCOPY WITH PROPOFOL;  Surgeon: Manya Silvas, MD;  Location: [facility] ENDOSCOPY;  Service: Endoscopy;  Laterality: N/A;  . ESOPHAGOGASTRODUODENOSCOPY (EGD) WITH PROPOFOL N/A 01/12/2015   Procedure: ESOPHAGOGASTRODUODENOSCOPY (EGD) WITH PROPOFOL;  Surgeon: Manya Silvas, MD;  Location: [facility] ENDOSCOPY;  Service: Endoscopy;  Laterality: N/A;  . NASAL RECONSTRUCTION  1983  . SAVORY DILATION N/A 01/12/2015   Procedure: SAVORY DILATION;  Surgeon: Manya Silvas, MD;  Location: [facility] ENDOSCOPY;  Service: Endoscopy;  Laterality: N/A;  . TONSILLECTOMY AND ADENOIDECTOMY  1956    Family History  Problem Relation Age of Onset  . Breast cancer Sister        age 80  . Cancer Paternal Grandmother        breast  . Cancer Cousin        breast  . Cancer Cousin        kidney    Social  History Social History   Tobacco Use  . Smoking status: Never Smoker  . Smokeless tobacco: Never Used  Substance Use Topics  . Alcohol use: No    Alcohol/week: 0.0 oz  . Drug use: No    Allergies  Allergen Reactions  . Penicillin G Rash, Shortness Of Breath and Swelling  . Fluocinonide   . Other Other (See Comments)    Dodecyl gallate: Positive patch test  Elta MD UV Clear SPF 46: Positive patch test  fragrance dodocylgallate dodocylgallate  . Parabens Other (See Comments)    Positive patch test   . Penicillins   . Shellac   . Triamcinolone Acetonide   . Triamcinolone Rash    Current Outpatient Medications  Medication Sig Dispense Refill  . atenolol (TENORMIN) 50 MG tablet Take 50 mg by mouth daily.    . cetirizine (ZYRTEC) 10 MG tablet Take 10 mg by mouth daily.    . Cholecalciferol 1000 UNITS tablet Take 1,000 Units by mouth daily.    . citalopram (CELEXA) 20 MG tablet Take 20 mg by mouth daily.    . colestipol (COLESTID) 1 G tablet Take 1 g by mouth as needed.     . fluticasone (FLONASE) 50 MCG/ACT nasal spray Place 2 sprays into both nostrils daily. (Patient taking differently: Place  2 sprays into both nostrils as needed. ) 16 g 5  . meloxicam (MOBIC) 15 MG tablet Take 15 mg by mouth daily.    . Multiple Vitamin (MULTIVITAMIN) tablet Take 1 tablet by mouth daily.    . pantoprazole (PROTONIX) 40 MG tablet Take 40 mg by mouth daily.    Marland Kitchen PREMARIN vaginal cream Place 1 application vaginally once a week.  2  . ranitidine (ZANTAC) 300 MG tablet Take 1 tablet (300 mg total) by mouth at bedtime. 30 tablet 5  . triamterene-hydrochlorothiazide (MAXZIDE-25) 37.5-25 MG tablet Take 1 tablet by mouth daily.    . vitamin A 10000 UNIT capsule Take 10,000 Units by mouth daily.    . vitamin B-12 (CYANOCOBALAMIN) 1000 MCG tablet Take 1,000 mcg by mouth daily.    . vitamin C (ASCORBIC ACID) 500 MG tablet Take 500 mg by mouth daily.    . benzonatate (TESSALON PERLES) 100 MG capsule  Take 2 capsules (200 mg total) by mouth every 6 (six) hours as needed for cough. (Patient not taking: Reported on 08/05/2017) 60 capsule 1   No current facility-administered medications for this visit.     Review of Systems Review of Systems  Constitutional: Negative.   Respiratory: Negative.   Cardiovascular: Negative.     Blood pressure 138/74, pulse 84, resp. rate 12, height 5' 6.5" (1.689 m), weight 166 lb (75.3 kg), SpO2 97 %.  Physical Exam Physical Exam  Constitutional: She is oriented to person, place, and time. She appears well-developed and well-nourished.  Pulmonary/Chest:  Small bruising left breast biopsy site  Neurological: She is alert and oriented to person, place, and time.  Skin: Skin is warm and dry.  Psychiatric: Her behavior is normal.    Data Reviewed Breast, left, needle core biopsy, upper outer 2 o'clock - INVASIVE DUCTAL CARCINOMA. - DUCTAL CARCINOMA IN SITU.  Assessment    New left breast cancer, clinical stage I.    Plan    The patient was seen here in February, 2019 in regards to nipple drainage.  Breast examination at that time was negative.  The area of biopsy in the upper inner quadrant completed in February 2018 as well away from the present site of malignancy.  This area has developed fairly rapidly, and I anticipate will resolve rapidly with appropriate therapy  Even though this is less than a 2 cm lesion, considering its rapid growth over the last 3 months she may be a candidate for neoadjuvant chemotherapy.  ER/PR HER-2 are presently pending, all of which would be important markers to help determine treatment planning.  We discussed options for management of the breast, and the patient expressed that if she had her choice, she would have nipple sparing mastectomy and immediate reconstruction on that side.  She brought up the possibility of bilateral mastectomy, and this was discouraged based on the [employer] Society of Breast Surgeons  recommendations against prophylactic mastectomy.  If indeed she is a candidate for chemotherapy, especially of adjuvant, I would not want a contralateral procedure to delay initiation of appropriate therapy.  She brought up whether she should have a PET scan.  At this time she has a clinically negative axilla as well as a axilla negative by ultrasound.  Unless her laboratory markers are high, would likely postpone any consideration for PET scan pending medical oncology assessment.  Plans have been for a follow-up mammogram in July 2019 (breast cyst noted in the right breast in February the time of her presentation with nipple  drainage).  We will plan to move this up to a bilateral diagnostic study to see if indeed this lesion is visible on mammogram which would strongly suggest a lobular pattern.  Either that or were dealing with a fairly aggressive invasive mammary carcinoma.  We will arrange for baseline laboratory studies to be completed at [facility] tomorrow including a CBC, comprehensive metabolic panel, CA 93-73 and CEA.  We will arrange for medical oncology assessment next week when it is anticipated her receptor status is will be available, and make arrangements for plastic surgery consultation in the next week or 2 just so she can discuss with Dr. Marla Roe the pros and cons of skin/nipple sparing mastectomy.  The opportunity for second surgical opinion was discussed, and at this time declined.     HPI, Physical Exam, Assessment and Plan have been scribed under the direction and in the presence of Robert Bellow, MD. Karie Fetch, RN  I have completed the exam and reviewed the above documentation for accuracy and completeness.  I agree with the above.  Haematologist has been used and any errors in dictation or transcription are unintentional.  Hervey Ard, M.D., F.A.C.S.  Forest Gleason Masaji Billups 08/05/2017, 8:04 PM

## 2017-08-05 NOTE — Patient Instructions (Signed)
The patient is aware to call back for any questions or concerns.  

## 2017-08-06 ENCOUNTER — Other Ambulatory Visit: Payer: Self-pay | Admitting: *Deleted

## 2017-08-06 ENCOUNTER — Telehealth: Payer: Self-pay | Admitting: *Deleted

## 2017-08-06 ENCOUNTER — Other Ambulatory Visit
Admission: RE | Admit: 2017-08-06 | Discharge: 2017-08-06 | Disposition: A | Discharge status: Home / Self Care | Payer: Medicare Other | Source: Ambulatory Visit | Attending: General Surgery | Admitting: General Surgery

## 2017-08-06 DIAGNOSIS — C50912 Malignant neoplasm of unspecified site of left female breast: Secondary | ICD-10-CM

## 2017-08-06 LAB — CBC WITH DIFFERENTIAL/PLATELET
Basophils Absolute: 0 10*3/uL (ref 0–0.1)
Basophils Relative: 1 %
Eosinophils Absolute: 0.2 10*3/uL (ref 0–0.7)
Eosinophils Relative: 4 %
HCT: 38 % (ref 35.0–47.0)
Hemoglobin: 13.1 g/dL (ref 12.0–16.0)
Lymphocytes Relative: 31 %
Lymphs Abs: 1.6 10*3/uL (ref 1.0–3.6)
MCH: 31.4 pg (ref 26.0–34.0)
MCHC: 34.4 g/dL (ref 32.0–36.0)
MCV: 91.4 fL (ref 80.0–100.0)
Monocytes Absolute: 0.4 10*3/uL (ref 0.2–0.9)
Monocytes Relative: 7 %
Neutro Abs: 2.9 10*3/uL (ref 1.4–6.5)
Neutrophils Relative %: 57 %
Platelets: 166 10*3/uL (ref 150–440)
RBC: 4.15 MIL/uL (ref 3.80–5.20)
RDW: 12.2 % (ref 11.5–14.5)
WBC: 5 10*3/uL (ref 3.6–11.0)

## 2017-08-06 LAB — COMPREHENSIVE METABOLIC PANEL
ALT: 19 U/L (ref 14–54)
AST: 24 U/L (ref 15–41)
Albumin: 3.7 g/dL (ref 3.5–5.0)
Alkaline Phosphatase: 91 U/L (ref 38–126)
Anion gap: 10 (ref 5–15)
BUN: 20 mg/dL (ref 6–20)
CO2: 25 mmol/L (ref 22–32)
Calcium: 8.8 mg/dL — ABNORMAL LOW (ref 8.9–10.3)
Chloride: 102 mmol/L (ref 101–111)
Creatinine, Ser: 0.89 mg/dL (ref 0.44–1.00)
GFR calc Af Amer: >60 mL/min (ref >60)
GFR calc non Af Amer: >60 mL/min (ref >60)
Glucose, Bld: 160 mg/dL — ABNORMAL HIGH (ref 65–99)
Potassium: 3.4 mmol/L — ABNORMAL LOW (ref 3.5–5.1)
Sodium: 137 mmol/L (ref 135–145)
Total Bilirubin: 0.6 mg/dL (ref 0.3–1.2)
Total Protein: 6.6 g/dL (ref 6.5–8.1)

## 2017-08-06 NOTE — Telephone Encounter (Signed)
Patient contacted today and notified that Dr. Kem Parkinson office and Dr. Eusebio Friendly office will be calling her to arrange an appointment.   The patient wishes to have labs completed through [facility] today. Order in [place].   Mammogram has been moved up from July 2019 to 08-14-17 at 11:20 am.   The patient is aware of the above and verbalizes understanding. She is aware to call the office if she has further questions.

## 2017-08-06 NOTE — Telephone Encounter (Signed)
-----   Message from Robert Bellow, MD sent at 08/05/2017  8:22 PM EDT ----- Please arrange for an appointment with Nolon Stalls, MD either late Monday or on Wednesday next week regarding new diagnosis left breast cancer.  Please arrange for the patient have a CBC, comprehensive metabolic panel, CEA and CA 27-29 tomorrow.  This can need to be at [place] or at the hospital.  Please arrange for an appointment with Audelia Hives, MD in the next 1-2 weeks to discuss nipple sparing mastectomy.  Please arrange for bilateral diagnostic mammograms in the near future, new diagnosis left breast cancer.  Thank you

## 2017-08-07 DIAGNOSIS — C50912 Malignant neoplasm of unspecified site of left female breast: Secondary | ICD-10-CM | POA: Insufficient documentation

## 2017-08-07 LAB — CANCER ANTIGEN 27.29: CA 27.29: 17.3 U/mL (ref 0.0–38.6)

## 2017-08-07 LAB — CEA: CEA: 1.1 ng/mL (ref 0.0–4.7)

## 2017-08-08 HISTORY — PX: BREAST BIOPSY: SHX20

## 2017-08-10 ENCOUNTER — Other Ambulatory Visit: Payer: Self-pay | Admitting: *Deleted

## 2017-08-10 DIAGNOSIS — C50912 Malignant neoplasm of unspecified site of left female breast: Secondary | ICD-10-CM

## 2017-08-12 ENCOUNTER — Telehealth: Payer: Self-pay | Admitting: *Deleted

## 2017-08-12 ENCOUNTER — Inpatient Hospital Stay: Payer: Medicare Other

## 2017-08-12 ENCOUNTER — Encounter: Payer: Self-pay | Admitting: Hematology and Oncology

## 2017-08-12 ENCOUNTER — Inpatient Hospital Stay: Payer: Medicare Other | Attending: Hematology and Oncology | Admitting: Hematology and Oncology

## 2017-08-12 VITALS — BP 153/81 | HR 64 | Temp 98.0°F | Resp 18 | Ht 66.5 in | Wt 167.1 lb

## 2017-08-12 DIAGNOSIS — Z170 Estrogen receptor positive status [ER+]: Secondary | ICD-10-CM | POA: Diagnosis not present

## 2017-08-12 DIAGNOSIS — Z1371 Encounter for nonprocreative screening for genetic disease carrier status: Secondary | ICD-10-CM

## 2017-08-12 DIAGNOSIS — C50912 Malignant neoplasm of unspecified site of left female breast: Secondary | ICD-10-CM

## 2017-08-12 DIAGNOSIS — Z5189 Encounter for other specified aftercare: Secondary | ICD-10-CM | POA: Diagnosis not present

## 2017-08-12 DIAGNOSIS — R911 Solitary pulmonary nodule: Secondary | ICD-10-CM

## 2017-08-12 DIAGNOSIS — C50412 Malignant neoplasm of upper-outer quadrant of left female breast: Secondary | ICD-10-CM | POA: Diagnosis not present

## 2017-08-12 DIAGNOSIS — M8580 Other specified disorders of bone density and structure, unspecified site: Secondary | ICD-10-CM | POA: Diagnosis not present

## 2017-08-12 DIAGNOSIS — Z13820 Encounter for screening for osteoporosis: Secondary | ICD-10-CM

## 2017-08-12 DIAGNOSIS — Z5111 Encounter for antineoplastic chemotherapy: Secondary | ICD-10-CM | POA: Diagnosis not present

## 2017-08-12 HISTORY — DX: Encounter for nonprocreative screening for genetic disease carrier status: Z13.71

## 2017-08-12 MED ORDER — LETROZOLE 2.5 MG PO TABS: 2.5000 mg | ORAL_TABLET | Freq: Every day | ORAL | 0 refills | Status: DC

## 2017-08-12 NOTE — Patient Instructions (Addendum)
Letrozole tablets What is this medicine? LETROZOLE (LET roe zole) blocks the production of estrogen. It is used to treat breast cancer. This medicine may be used for other purposes; ask your health care provider or pharmacist if you have questions. COMMON BRAND NAME(S): Femara What should I tell my health care provider before I take this medicine? They need to know if you have any of these conditions: -high cholesterol -liver disease -osteoporosis (weak bones) -an unusual or allergic reaction to letrozole, other medicines, foods, dyes, or preservatives -pregnant or trying to get pregnant -breast-feeding How should I use this medicine? Take this medicine by mouth with a glass of water. You may take it with or without food. Follow the directions on the prescription label. Take your medicine at regular intervals. Do not take your medicine more often than directed. Do not stop taking except on your doctor's advice. Talk to your pediatrician regarding the use of this medicine in children. Special care may be needed. Overdosage: If you think you have taken too much of this medicine contact a poison control center or emergency room at once. NOTE: This medicine is only for you. Do not share this medicine with others. What if I miss a dose? If you miss a dose, take it as soon as you can. If it is almost time for your next dose, take only that dose. Do not take double or extra doses. What may interact with this medicine? Do not take this medicine with any of the following medications: -estrogens, like hormone replacement therapy or birth control pills This medicine may also interact with the following medications: -dietary supplements such as androstenedione or DHEA -prasterone -tamoxifen This list may not describe all possible interactions. Give your health care provider a list of all the medicines, herbs, non-prescription drugs, or dietary supplements you use. Also tell them if you smoke, drink  alcohol, or use illegal drugs. Some items may interact with your medicine. What should I watch for while using this medicine? Tell your doctor or healthcare professional if your symptoms do not start to get better or if they get worse. Do not become pregnant while taking this medicine or for 3 weeks after stopping it. Women should inform their doctor if they wish to become pregnant or think they might be pregnant. There is a potential for serious side effects to an unborn child. Talk to your health care professional or pharmacist for more information. Do not breast-feed while taking this medicine or for 3 weeks after stopping it. This medicine may interfere with the ability to have a child. Talk with your doctor or health care professional if you are concerned about your fertility. Using this medicine for a long time may increase your risk of low bone mass. Talk to your doctor about bone health. You may get drowsy or dizzy. Do not drive, use machinery, or do anything that needs mental alertness until you know how this medicine affects you. Do not stand or sit up quickly, especially if you are an older patient. This reduces the risk of dizzy or fainting spells. You may need blood work done while you are taking this medicine. What side effects may I notice from receiving this medicine? Side effects that you should report to your doctor or health care professional as soon as possible: -allergic reactions like skin rash, itching, or hives -bone fracture -chest pain -signs and symptoms of a blood clot such as breathing problems; changes in vision; chest pain; severe, sudden headache; pain, swelling,  warmth in the leg; trouble speaking; sudden numbness or weakness of the face, arm or leg -vaginal bleeding Side effects that usually do not require medical attention (report to your doctor or health care professional if they continue or are bothersome): -bone, back, joint, or muscle  pain -dizziness -fatigue -fluid retention -headache -hot flashes, night sweats -nausea -weight gain This list may not describe all possible side effects. Call your doctor for medical advice about side effects. You may report side effects to FDA at 1-800-FDA-1088. Where should I keep my medicine? Keep out of the reach of children. Store between 15 and 30 degrees C (59 and 86 degrees F). Throw away any unused medicine after the expiration date. NOTE: This sheet is a summary. It may not cover all possible information. If you have questions about this medicine, talk to your doctor, pharmacist, or health care provider.  2018 Elsevier/Gold Standard (2015-10-01 11:10:41) Docetaxel injection What is this medicine? DOCETAXEL (doe se TAX el) is a chemotherapy drug. It targets fast dividing cells, like cancer cells, and causes these cells to die. This medicine is used to treat many types of cancers like breast cancer, certain stomach cancers, head and neck cancer, lung cancer, and prostate cancer. This medicine may be used for other purposes; ask your health care provider or pharmacist if you have questions. COMMON BRAND NAME(S): Docefrez, Taxotere What should I tell my health care provider before I take this medicine? They need to know if you have any of these conditions: -infection (especially a virus infection such as chickenpox, cold sores, or herpes) -liver disease -low blood counts, like low white cell, platelet, or red cell counts -an unusual or allergic reaction to docetaxel, polysorbate 80, other chemotherapy agents, other medicines, foods, dyes, or preservatives -pregnant or trying to get pregnant -breast-feeding How should I use this medicine? This drug is given as an infusion into a vein. It is administered in a hospital or clinic by a specially trained health care professional. Talk to your pediatrician regarding the use of this medicine in children. Special care may be  needed. Overdosage: If you think you have taken too much of this medicine contact a poison control center or emergency room at once. NOTE: This medicine is only for you. Do not share this medicine with others. What if I miss a dose? It is important not to miss your dose. Call your doctor or health care professional if you are unable to keep an appointment. What may interact with this medicine? -cyclosporine -erythromycin -ketoconazole -medicines to increase blood counts like filgrastim, pegfilgrastim, sargramostim -vaccines Talk to your doctor or health care professional before taking any of these medicines: -acetaminophen -aspirin -ibuprofen -ketoprofen -naproxen This list may not describe all possible interactions. Give your health care provider a list of all the medicines, herbs, non-prescription drugs, or dietary supplements you use. Also tell them if you smoke, drink alcohol, or use illegal drugs. Some items may interact with your medicine. What should I watch for while using this medicine? Your condition will be monitored carefully while you are receiving this medicine. You will need important blood work done while you are taking this medicine. This drug may make you feel generally unwell. This is not uncommon, as chemotherapy can affect healthy cells as well as cancer cells. Report any side effects. Continue your course of treatment even though you feel ill unless your doctor tells you to stop. In some cases, you may be given additional medicines to help with side effects. Follow  all directions for their use. Call your doctor or health care professional for advice if you get a fever, chills or sore throat, or other symptoms of a cold or flu. Do not treat yourself. This drug decreases your body's ability to fight infections. Try to avoid being around people who are sick. This medicine may increase your risk to bruise or bleed. Call your doctor or health care professional if you notice  any unusual bleeding. This medicine may contain alcohol in the product. You may get drowsy or dizzy. Do not drive, use machinery, or do anything that needs mental alertness until you know how this medicine affects you. Do not stand or sit up quickly, especially if you are an older patient. This reduces the risk of dizzy or fainting spells. Avoid alcoholic drinks. Do not become pregnant while taking this medicine. Women should inform their doctor if they wish to become pregnant or think they might be pregnant. There is a potential for serious side effects to an unborn child. Talk to your health care professional or pharmacist for more information. Do not breast-feed an infant while taking this medicine. What side effects may I notice from receiving this medicine? Side effects that you should report to your doctor or health care professional as soon as possible: -allergic reactions like skin rash, itching or hives, swelling of the face, lips, or tongue -low blood counts - This drug may decrease the number of white blood cells, red blood cells and platelets. You may be at increased risk for infections and bleeding. -signs of infection - fever or chills, cough, sore throat, pain or difficulty passing urine -signs of decreased platelets or bleeding - bruising, pinpoint red spots on the skin, black, tarry stools, nosebleeds -signs of decreased red blood cells - unusually weak or tired, fainting spells, lightheadedness -breathing problems -fast or irregular heartbeat -low blood pressure -mouth sores -nausea and vomiting -pain, swelling, redness or irritation at the injection site -pain, tingling, numbness in the hands or feet -swelling of the ankle, feet, hands -weight gain Side effects that usually do not require medical attention (report to your doctor or health care professional if they continue or are bothersome): -bone pain -complete hair loss including hair on your head, underarms, pubic hair,  eyebrows, and eyelashes -diarrhea -excessive tearing -changes in the color of fingernails -loosening of the fingernails -nausea -muscle pain -red flush to skin -sweating -weak or tired This list may not describe all possible side effects. Call your doctor for medical advice about side effects. You may report side effects to FDA at 1-800-FDA-1088. Where should I keep my medicine? This drug is given in a hospital or clinic and will not be stored at home. NOTE: This sheet is a summary. It may not cover all possible information. If you have questions about this medicine, talk to your doctor, pharmacist, or health care provider.  2018 Elsevier/Gold Standard (2015-03-29 12:32:56) Cyclophosphamide injection What is this medicine? CYCLOPHOSPHAMIDE (sye kloe FOSS fa mide) is a chemotherapy drug. It slows the growth of cancer cells. This medicine is used to treat many types of cancer like lymphoma, myeloma, leukemia, breast cancer, and ovarian cancer, to name a few. This medicine may be used for other purposes; ask your health care provider or pharmacist if you have questions. COMMON BRAND NAME(S): Cytoxan, Neosar What should I tell my health care provider before I take this medicine? They need to know if you have any of these conditions: -blood disorders -history of other chemotherapy -infection -  kidney disease -liver disease -recent or ongoing radiation therapy -tumors in the bone marrow -an unusual or allergic reaction to cyclophosphamide, other chemotherapy, other medicines, foods, dyes, or preservatives -pregnant or trying to get pregnant -breast-feeding How should I use this medicine? This drug is usually given as an injection into a vein or muscle or by infusion into a vein. It is administered in a hospital or clinic by a specially trained health care professional. Talk to your pediatrician regarding the use of this medicine in children. Special care may be needed. Overdosage: If you  think you have taken too much of this medicine contact a poison control center or emergency room at once. NOTE: This medicine is only for you. Do not share this medicine with others. What if I miss a dose? It is important not to miss your dose. Call your doctor or health care professional if you are unable to keep an appointment. What may interact with this medicine? This medicine may interact with the following medications: -amiodarone -amphotericin B -azathioprine -certain antiviral medicines for HIV or AIDS such as protease inhibitors (e.g., indinavir, ritonavir) and zidovudine -certain blood pressure medications such as benazepril, captopril, enalapril, fosinopril, lisinopril, moexipril, monopril, perindopril, quinapril, ramipril, trandolapril -certain cancer medications such as anthracyclines (e.g., daunorubicin, doxorubicin), busulfan, cytarabine, paclitaxel, pentostatin, tamoxifen, trastuzumab -certain diuretics such as chlorothiazide, chlorthalidone, hydrochlorothiazide, indapamide, metolazone -certain medicines that treat or prevent blood clots like warfarin -certain muscle relaxants such as succinylcholine -cyclosporine -etanercept -indomethacin -medicines to increase blood counts like filgrastim, pegfilgrastim, sargramostim -medicines used as general anesthesia -metronidazole -natalizumab This list may not describe all possible interactions. Give your health care provider a list of all the medicines, herbs, non-prescription drugs, or dietary supplements you use. Also tell them if you smoke, drink alcohol, or use illegal drugs. Some items may interact with your medicine. What should I watch for while using this medicine? Visit your doctor for checks on your progress. This drug may make you feel generally unwell. This is not uncommon, as chemotherapy can affect healthy cells as well as cancer cells. Report any side effects. Continue your course of treatment even though you feel ill  unless your doctor tells you to stop. Drink water or other fluids as directed. Urinate often, even at night. In some cases, you may be given additional medicines to help with side effects. Follow all directions for their use. Call your doctor or health care professional for advice if you get a fever, chills or sore throat, or other symptoms of a cold or flu. Do not treat yourself. This drug decreases your body's ability to fight infections. Try to avoid being around people who are sick. This medicine may increase your risk to bruise or bleed. Call your doctor or health care professional if you notice any unusual bleeding. Be careful brushing and flossing your teeth or using a toothpick because you may get an infection or bleed more easily. If you have any dental work done, tell your dentist you are receiving this medicine. You may get drowsy or dizzy. Do not drive, use machinery, or do anything that needs mental alertness until you know how this medicine affects you. Do not become pregnant while taking this medicine or for 1 year after stopping it. Women should inform their doctor if they wish to become pregnant or think they might be pregnant. Men should not father a child while taking this medicine and for 4 months after stopping it. There is a potential for serious side effects to  an unborn child. Talk to your health care professional or pharmacist for more information. Do not breast-feed an infant while taking this medicine. This medicine may interfere with the ability to have a child. This medicine has caused ovarian failure in some women. This medicine has caused reduced sperm counts in some men. You should talk with your doctor or health care professional if you are concerned about your fertility. If you are going to have surgery, tell your doctor or health care professional that you have taken this medicine. What side effects may I notice from receiving this medicine? Side effects that you should  report to your doctor or health care professional as soon as possible: -allergic reactions like skin rash, itching or hives, swelling of the face, lips, or tongue -low blood counts - this medicine may decrease the number of white blood cells, red blood cells and platelets. You may be at increased risk for infections and bleeding. -signs of infection - fever or chills, cough, sore throat, pain or difficulty passing urine -signs of decreased platelets or bleeding - bruising, pinpoint red spots on the skin, black, tarry stools, blood in the urine -signs of decreased red blood cells - unusually weak or tired, fainting spells, lightheadedness -breathing problems -dark urine -dizziness -palpitations -swelling of the ankles, feet, hands -trouble passing urine or change in the amount of urine -weight gain -yellowing of the eyes or skin Side effects that usually do not require medical attention (report to your doctor or health care professional if they continue or are bothersome): -changes in nail or skin color -hair loss -missed menstrual periods -mouth sores -nausea, vomiting This list may not describe all possible side effects. Call your doctor for medical advice about side effects. You may report side effects to FDA at 1-800-FDA-1088. Where should I keep my medicine? This drug is given in a hospital or clinic and will not be stored at home. NOTE: This sheet is a summary. It may not cover all possible information. If you have questions about this medicine, talk to your doctor, pharmacist, or health care provider.  2018 Elsevier/Gold Standard (2012-01-09 16:22:58)

## 2017-08-12 NOTE — Telephone Encounter (Signed)
Patient's left mastectomy with SLN biopsy with Dr. Bary Castilla and immediate reconstruction with Dr. Marla Roe has been scheduled for 09-18-17 at [facility].   The patient is aware of surgery date, arrival time, and instructions.  No pre-op visit will be required per Dr. Bary Castilla. History and physical will be updated the morning of procedure.   Patient is aware to call the office if she has further questions. She verbalizes understanding.

## 2017-08-12 NOTE — Progress Notes (Signed)
[facility] day:  08/12/2017  Chief Complaint: Mariah Hogan is a 67 y.o. female with clinical stage IA left breast cancer who is referred in consultation by Dr. Bary Castilla for assessment and management.  HPI: She has a history of multiple breast biopsies.  Prior to recent events, left breast biopsy at 11:30 revealed fibrocystic change with calcifications and no evidence of malignancy.  Patient was seen on 04/14/2017 by Dr. Bary Castilla for clear right nipple discharge since 04/03/2017.  Exam was benign.  Right diagnostic mammogram and ultrasound on 04/10/2017 revealed a 6 x 7 x 4 mm simple cyst in the right breast at 12:00 2 cm from the nipple.There was a low density oval circumscribed mass at 6:00 without correlate on ultrasound.  She presented with a palpable left breast mass on 08/04/2017.  Patient notes that when her grandchild would lay on her LEFT chest, she experienced pain. This led to further testing.  Left breast ultrasound revealed a 1.65 x 1.69 x 1.86 cm multi-lobulated hypoechoic mass in the upper outer quadrant of the left breast in the 2 o'clock position 7 cm from the nipple.  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  Tumor was ER + (95%), PR+ (20%), and Her 2 neu - by FISH.  Ki67 was 80%.  Labs on 08/06/2017 revealed a hematocrit 38.0, hemoglobin 13.1, MCV 91.4, platelets 166,000, white count 5000 with an ANC of 2900.  LFTs were normal.  CA 27.29 was 17. 3.  CEA was 1.1.  Symptomatically, she has been more fatigued over the last 6 months. She is not sleeping well. Patient denies any B symptoms. Additionally, she denies recent infections. Patient notes a cough and hoarse voice quality since 03/2016. She has been taken off of ACEI therapy. She is taking antihistamines and MDI. Patient has been seen in consult by ENT and advised that everything was "fine".   Patient  denies bleeding; no hematochezia, melena, or gross hematuria. Last colonoscopy was in 2016. Patient does perform monthly self breast examinations as recommended.   Menarche was as the age 28. Pregnancies: G1P1 (at age 71).  Patient did not breastfeed her children. She used contraceptives for about "20 some years" durig her life to to "a lot of problems with my periods when I was young".  Patient underwent a total hysterectomy in 05/1996. Patient is currently using Premarin cream for "urinary leakage".   She has a family history of breast cancer:  Sister (age 22), several maternal cousins (ages 25-60s), and paternal grandmother had breast cancer. She has never had genetic testing performed.    Past Medical History:  Diagnosis Date  . Arthritis   . Asthma   . Breast cancer ([place]) 08/04/2017   left breast INVASIVE DUCTAL CARCINOMA.  . Cancer ([place])    [phone] basal cell carcinoma  . GERD (gastroesophageal reflux disease)   . Hyperlipidemia   . Hypertension     Past Surgical History:  Procedure Laterality Date  . ABDOMINAL HYSTERECTOMY  1998  . bladder tack  8309,[id number]  . BREAST BIOPSY Bilateral   . BREAST BIOPSY Left 1990  . BREAST BIOPSY Left 2018   core bx- neg  . 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  Marland Kitchen BREAST CYST EXCISION  x3  . BREAST SURGERY Left 02/13/14   Fibrocystic changes, pseudo-angiomatous stromal hyperplasia. No atypia or malignancy.  Marland Kitchen  CARPAL TUNNEL RELEASE  x2  . CHOLECYSTECTOMY  2008  . COLONOSCOPY WITH PROPOFOL N/A 01/12/2015   Procedure: COLONOSCOPY WITH PROPOFOL;  Surgeon: Manya Silvas, MD;  Location: [facility] ENDOSCOPY;  Service: Endoscopy;  Laterality: N/A;  . ESOPHAGOGASTRODUODENOSCOPY (EGD) WITH PROPOFOL N/A 01/12/2015   Procedure: ESOPHAGOGASTRODUODENOSCOPY (EGD) WITH PROPOFOL;  Surgeon: Manya Silvas, MD;  Location: [facility] ENDOSCOPY;  Service: Endoscopy;  Laterality: N/A;  . NASAL RECONSTRUCTION  1983  . SAVORY  DILATION N/A 01/12/2015   Procedure: SAVORY DILATION;  Surgeon: Manya Silvas, MD;  Location: [facility] ENDOSCOPY;  Service: Endoscopy;  Laterality: N/A;  . TONSILLECTOMY AND ADENOIDECTOMY  1956    Family History  Problem Relation Age of Onset  . Breast cancer Sister        age 85  . Cancer Sister   . Cancer Paternal Grandmother        breast  . Cancer Cousin        breast  . Cancer Cousin        kidney  . Cancer Maternal Grandmother     Social History:  reports that she has never smoked. She has never used smokeless tobacco. She reports that she does not drink alcohol or use drugs.  Her daughter is [name], [place]. She is a retired Customer service manager for 35 years. Patient denies known exposures to radiation on toxins. The patient is accompanied by her daughter, [name], today.  Allergies:  Allergies  Allergen Reactions  . Penicillin G Rash, Shortness Of Breath and Swelling  . Fluocinonide   . Other Other (See Comments)    Dodecyl gallate: Positive patch test  Elta MD UV Clear SPF 46: Positive patch test  fragrance dodocylgallate dodocylgallate  . Parabens Other (See Comments)    Positive patch test   . Penicillins   . Shellac   . Triamcinolone Acetonide   . Triamcinolone Rash    Current Medications: Current Outpatient Medications  Medication Sig Dispense Refill  . atenolol (TENORMIN) 50 MG tablet Take 50 mg by mouth daily.    . cetirizine (ZYRTEC) 10 MG tablet Take 10 mg by mouth daily.    . Cholecalciferol 1000 UNITS tablet Take 1,000 Units by mouth daily.    . citalopram (CELEXA) 20 MG tablet Take 20 mg by mouth daily.    . colestipol (COLESTID) 1 G tablet Take 1 g by mouth as needed.     . fluticasone (FLONASE) 50 MCG/ACT nasal spray Place 2 sprays into both nostrils daily. (Patient taking differently: Place 2 sprays into both nostrils as needed. ) 16 g 5  . meloxicam (MOBIC) 15 MG tablet Take 15 mg by mouth daily.    . Multiple Vitamin (MULTIVITAMIN) tablet Take 1 tablet by  mouth daily.    . pantoprazole (PROTONIX) 40 MG tablet Take 40 mg by mouth daily.    Marland Kitchen PREMARIN vaginal cream Place 1 application vaginally once a week.  2  . ranitidine (ZANTAC) 300 MG tablet Take 1 tablet (300 mg total) by mouth at bedtime. 30 tablet 5  . triamterene-hydrochlorothiazide (MAXZIDE-25) 37.5-25 MG tablet Take 1 tablet by mouth daily.    . vitamin A 10000 UNIT capsule Take 10,000 Units by mouth daily.    . vitamin B-12 (CYANOCOBALAMIN) 1000 MCG tablet Take 1,000 mcg by mouth daily.    . benzonatate (TESSALON PERLES) 100 MG capsule Take 2 capsules (200 mg total) by mouth every 6 (six) hours as needed for cough. (Patient not taking: Reported on 08/05/2017) 60 capsule  1  . vitamin C (ASCORBIC ACID) 500 MG tablet Take 500 mg by mouth daily.     No current facility-administered medications for this visit.     Review of Systems  Constitutional: Positive for malaise/fatigue (mild). Negative for diaphoresis, fever and weight loss.  HENT: Negative for nosebleeds and sore throat.        Hoarse voice quality for over a year  Eyes: Negative.  Negative for blurred vision, double vision, photophobia, pain and discharge.  Respiratory: Positive for cough (for over a year). Negative for hemoptysis, sputum production and shortness of breath.   Cardiovascular: Positive for palpitations (irregular heart beat on atenolol). Negative for chest pain, orthopnea, leg swelling and PND.  Gastrointestinal: Positive for diarrhea (chronic; several bowel movements/day). Negative for abdominal pain, blood in stool, constipation, melena, nausea and vomiting.  Genitourinary: Negative for dysuria, frequency, hematuria and urgency.  Musculoskeletal: Positive for joint pain (arthritis in hands and knees). Negative for back pain, falls and myalgias.  Skin: Negative for itching and rash.       History of BCC to face and leg  Neurological: Negative for dizziness, tremors, weakness and headaches.  Endo/Heme/Allergies:  Does not bruise/bleed easily.  Psychiatric/Behavioral: Negative for depression, memory loss and suicidal ideas. The patient is not nervous/anxious and does not have insomnia.   All other systems reviewed and are negative.  Physical Exam: Blood pressure (!) 153/81, pulse 64, temperature 98 F (36.7 C), temperature source Tympanic, resp. rate 18, height 5' 6.5" (1.689 m), weight 167 lb 1.7 oz (75.8 kg). GENERAL:  Well developed, well nourished, woman sitting comfortably in the exam room in no acute distress. MENTAL STATUS:  Alert and oriented to person, place and time. HEAD:  Shoulder length brown hair.  Normocephalic, atraumatic, face symmetric, no Cushingoid features. EYES:  Blue eyes.  Pupils equal round and reactive to light and accomodation.  No conjunctivitis or scleral icterus. ENT:  Oropharynx clear without lesion.  Tongue normal. Mucous membranes moist.  RESPIRATORY:  Clear to auscultation without rales, wheezes or rhonchi. CARDIOVASCULAR:  Regular rate and rhythm without murmur, rub or gallop. BREAST:  Bilateral fibrocystic changes.  Right breast without masses, skin changes or nipple discharge.  Left breast with mass like fullness in the upper outer quadrant.  No skin changes or nipple discharge.  ABDOMEN:  Soft, non-tender, with active bowel sounds, and no hepatosplenomegaly.  No masses. SKIN:  No rashes, ulcers or lesions. EXTREMITIES: No edema, no skin discoloration or tenderness.  No palpable cords. LYMPH NODES: No palpable cervical, supraclavicular, axillary or inguinal adenopathy  NEUROLOGICAL: Unremarkable. PSYCH:  Appropriate.   No visits with results within 3 Day(s) from this visit.  Latest known visit with results is:  Hospital Outpatient Visit on 08/06/2017  Component Date Value Ref Range Status  . CA 27.29 08/06/2017 17.3  0.0 - 38.6 U/mL Final   Comment: (NOTE) Siemens Centaur Immunochemiluminometric Methodology [facility]) Values obtained with different assay methods  or kits cannot be used interchangeably. Results cannot be interpreted as absolute evidence of the presence or absence of malignant disease. Performed At: [facility] [place], [place] [id number] Rush Farmer MD [id number] Performed at [facility], [address] [place], [place] [address]   . CEA 08/06/2017 1.1  0.0 - 4.7 ng/mL Final   Comment: (NOTE)                             Nonsmokers          <  3.9                             Smokers             <5.6 Roche Diagnostics Electrochemiluminescence Immunoassay (ECLIA) Values obtained with different assay methods or kits cannot be used interchangeably.  Results cannot be interpreted as absolute evidence of the presence or absence of malignant disease. Performed At: [facility] [place], [place] [id number] Rush Farmer MD [id number] Performed at [facility], [address] [place], [place] [address]   . Sodium 08/06/2017 137  135 - 145 mmol/L Final  . Potassium 08/06/2017 3.4* 3.5 - 5.1 mmol/L Final  . Chloride 08/06/2017 102  101 - 111 mmol/L Final  . CO2 08/06/2017 25  22 - 32 mmol/L Final  . Glucose, Bld 08/06/2017 160* 65 - 99 mg/dL Final  . BUN 08/06/2017 20  6 - 20 mg/dL Final  . Creatinine, Ser 08/06/2017 0.89  0.44 - 1.00 mg/dL Final  . Calcium 08/06/2017 8.8* 8.9 - 10.3 mg/dL Final  . Total Protein 08/06/2017 6.6  6.5 - 8.1 g/dL Final  . Albumin 08/06/2017 3.7  3.5 - 5.0 g/dL Final  . AST 08/06/2017 24  15 - 41 U/L Final  . ALT 08/06/2017 19  14 - 54 U/L Final  . Alkaline Phosphatase 08/06/2017 91  38 - 126 U/L Final  . Total Bilirubin 08/06/2017 0.6  0.3 - 1.2 mg/dL Final  . GFR calc non Af Amer 08/06/2017 >60  >60 mL/min Final  . GFR calc Af Amer 08/06/2017 >60  >60 mL/min Final   Comment: (NOTE) The eGFR has been calculated using the CKD EPI equation. This calculation has not been validated in all clinical situations. eGFR's  persistently <60 mL/min signify possible Chronic Kidney Disease.   Georgiann Hahn gap 08/06/2017 10  5 - 15 Final   Performed at [facility], [address] [place], [place] [address]  . WBC 08/06/2017 5.0  3.6 - 11.0 K/uL Final  . RBC 08/06/2017 4.15  3.80 - 5.20 MIL/uL Final  . Hemoglobin 08/06/2017 13.1  12.0 - 16.0 g/dL Final  . HCT 08/06/2017 38.0  35.0 - 47.0 % Final  . MCV 08/06/2017 91.4  80.0 - 100.0 fL Final  . MCH 08/06/2017 31.4  26.0 - 34.0 pg Final  . MCHC 08/06/2017 34.4  32.0 - 36.0 g/dL Final  . RDW 08/06/2017 12.2  11.5 - 14.5 % Final  . Platelets 08/06/2017 166  150 - 440 K/uL Final  . Neutrophils Relative % 08/06/2017 57  % Final  . Neutro Abs 08/06/2017 2.9  1.4 - 6.5 K/uL Final  . Lymphocytes Relative 08/06/2017 31  % Final  . Lymphs Abs 08/06/2017 1.6  1.0 - 3.6 K/uL Final  . Monocytes Relative 08/06/2017 7  % Final  . Monocytes Absolute 08/06/2017 0.4  0.2 - 0.9 K/uL Final  . Eosinophils Relative 08/06/2017 4  % Final  . Eosinophils Absolute 08/06/2017 0.2  0 - 0.7 K/uL Final  . Basophils Relative 08/06/2017 1  % Final  . Basophils Absolute 08/06/2017 0.0  0 - 0.1 K/uL Final   Performed at [facility], [address] [place], [place] [address]    Assessment:  KEIRAN SIAS is a 67 y.o. female with clinical stage IA left breast cancer s/p biopsy on 08/04/2017.  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  Tumor was ER + (95%), PR+ (20%), and Her 2 neu - by FISH.  Ki67 was 80%.  CA27.29 was 17.3 on 08/06/2017.  Left breast ultrasound on 08/04/2017 revealed a 1.65 x 1.69 x 1.86 cm multi-lobulated hypoechoic mass in the upper outer quadrant of the left breast in the 2 o'clock position 7 cm from the nipple.  Chest CT on 09/23/2016 revealed a 4 mm RUL subpleural nodule.  She has a significant family history of breast cancer (paternal grandmother, sister, and 3 maternal cousins).  Symptomatically, She notes a little  fatigue.  She has a chronic cough.  Exam reveals mass like fullness in the left upper outer quadrant.  Plan: 1. Discuss grade II invasive ductal carcinoma with a component of DCIS in the LEFT breast. Tumor is ER (+), PR (+), and Her2/neu (-); clinical stage I. Proliferation marker Ki67 was high at 80%.  Tumor has rapidly developed.  She is considering mastectomy with immediate reconstruction (surgery date tentatively scheduled for 09/18/2017 secondary to coordination with Dr Bary Castilla and Dr Audelia Hives, plastic surgery).  Discuss concern for postponing systemic therapy given rapid tumor development.  Medical oncology - discuss neoadjuvant endocrine therapy using an aromatase inhibitor vs TC chemotherapy with Neulasta support. Discussed side effects associated with both aromatase inhibitors and the proposed chemotherapy regimen. Patient provided with printed information today on her AVS for review at home (Cytoxan, Taxol, and Femara). Patient encouraged to make a list of questions and/or concerns for further discussion prior to beginning therapy using this medication. Patient expresses wishes to proceed with treatment using Femara. Rx sent to patient's pharmacy.   Surgical - discuss consult with surgery. Patient has not fully decided on surgical intervention (lumpectomy vs. mastectomy). She is aware that lumpectomy will necessitate post surgical radiation therapy.  2. [facility] genetic testing. Will refer patient to genetic counselor. Patient amenable to proceeding with testing and counseling.  3. Discuss mammogram. Patient notes that routine mammogram has been moved from July to the end of this week (08/14/2017).  4. Discuss complexity of breast exam due to abnormal breast architecture. Will schedule for breast MRI.  5. Schedule CT of the chest to follow up lung nodules.  6. Schedule bone density study in anticipation of future endocrine therapy.  7. Discuss Oncotype DX or Mammoprint on biopsy  sample.  Discuss with Dr. Bary Castilla. 8. RTC after testing for MD assessment and finalization of treatment plan.    Honor Loh, NP  08/12/2017, 2:06 PM   I saw and evaluated the patient, participating in the key portions of the service and reviewing pertinent diagnostic studies and records.  I reviewed the nurse practitioner's note and agree with the findings and the plan.  The assessment and plan were discussed with the patient.  Multiple questions were asked by the patient and answered.   Nolon Stalls, MD 08/24/2017,6:14 AM

## 2017-08-12 NOTE — Progress Notes (Signed)
Patient here today as new evaluation regarding DCIS.  Referred by Dr. Bary Castilla.  Patient accompanied by family member, [name] today, who is a PA for GI.

## 2017-08-13 ENCOUNTER — Telehealth: Payer: Self-pay | Admitting: Genetic Counselor

## 2017-08-13 ENCOUNTER — Encounter: Payer: Self-pay | Admitting: Genetic Counselor

## 2017-08-13 NOTE — Telephone Encounter (Signed)
Cancer Genetics            Telegenetics Initial Visit    Patient Name: Mariah Hogan Patient DOB: 10-12-1950 Patient Age: 67 y.o. Phone Call Date: 08/13/2017  Referring Provider: Nolon Stalls, MD Honor Loh, NP  Reason for Visit: Evaluate for hereditary susceptibility to cancer    Assessment and Plan:  . Mariah Hogan's personal history of breast cancer is somewhat suggestive of a hereditary predisposition to cancer even though she was not diagnosed at a young age. She had a TAH/BSO in at age 56, which may have delayed the onset of her breast cancer. Her mother also had a TAH/BSO prior to menopause.  Marland Kitchen Testing is recommended to determine whether she has a pathogenic mutation that will impact her decision regarding breast surgery as well as her screening and risk-reduction for cancer. A negative result will be reassuring.  . Mariah Hogan wished to pursue genetic testing and her blood sample was sent to [facility] for analysis. [facility]'s STAT breast panel was requested as it will impact surgical decisions. Results should be available in about 7-12 days. The 9 genes on this panel are ATM, BRCA1, BRCA2, CDH1, CHEK2, PALB2, PTEN, STK11, TP53. Once this test is complete, analysis of additional genes on Multi-Cancer panel will proceed. She will be called after each result is obtained.   Dr. Grayland Ormond was available for questions concerning this case. Total time spent by counseling by phone was approximately 30 minutes.   _____________________________________________________________________   History of Present Illness: Mariah Hogan, a 67 y.o. female, was referred for genetic counseling to discuss the possibility of a hereditary predisposition to cancer and discuss whether genetic testing is warranted. This was a telegenetics visit via phone.  Mariah Hogan was recently diagnosed with breast cancer at the age of 38. She indicated that she will be using results of genetic  testing to decide which surgery to have.   Past Medical History:  Diagnosis Date  . Arthritis   . Asthma   . Breast cancer ([place]) 08/04/2017   left breast INVASIVE DUCTAL CARCINOMA.  . Cancer ([place])    [phone] basal cell carcinoma  . GERD (gastroesophageal reflux disease)   . Hyperlipidemia   . Hypertension     Past Surgical History:  Procedure Laterality Date  . ABDOMINAL HYSTERECTOMY  1998  . bladder tack  [id number]  . BREAST BIOPSY Bilateral   . BREAST BIOPSY Left 1990  . BREAST BIOPSY Left 2018   core bx- neg  . 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  Marland Kitchen BREAST CYST EXCISION  x3  . BREAST SURGERY Left 02/13/14   Fibrocystic changes, pseudo-angiomatous stromal hyperplasia. No atypia or malignancy.  . CARPAL TUNNEL RELEASE  x2  . CHOLECYSTECTOMY  2008  . COLONOSCOPY WITH PROPOFOL N/A 01/12/2015   Procedure: COLONOSCOPY WITH PROPOFOL;  Surgeon: Manya Silvas, MD;  Location: [facility] ENDOSCOPY;  Service: Endoscopy;  Laterality: N/A;  . ESOPHAGOGASTRODUODENOSCOPY (EGD) WITH PROPOFOL N/A 01/12/2015   Procedure: ESOPHAGOGASTRODUODENOSCOPY (EGD) WITH PROPOFOL;  Surgeon: Manya Silvas, MD;  Location: [facility] ENDOSCOPY;  Service: Endoscopy;  Laterality: N/A;  . NASAL RECONSTRUCTION  1983  . SAVORY DILATION N/A 01/12/2015   Procedure: SAVORY DILATION;  Surgeon: Manya Silvas, MD;  Location: [facility] ENDOSCOPY;  Service: Endoscopy;  Laterality: N/A;  . TONSILLECTOMY AND ADENOIDECTOMY  1956    Family History: Significant diagnoses include the  following:  Family History  Problem Relation Age of Onset  . Breast cancer Sister 15       currently 15  . Breast cancer Paternal Grandmother 59       bilateral; deceased 42s   . Breast cancer Cousin 57       daughter of an unaffected maternal aunt  . Breast cancer Cousin 27       kidney cancer also; daughter of an unaffected maternal aunt  . Other Mother        TAH/BSO prior to menopause  .  Prostate cancer Paternal Grandfather        age at dx unknown  . Breast cancer Maternal Aunt        age at dx unknown  . Breast cancer Cousin 75       daughter of an unaffected maternal aunt    Additionally, Mariah Hogan has one daughter (age 86).  Her only sister (noted above) has one daughter (age 52s). Her mother died at 57, cancer-free, but had a TAH/BSO prior to menopause. Her mother had 2 brothers and 3 sisters. Her father died at 18, cancer-free. He had 4 sisters and a brother.  Mariah Hogan ancestry is Caucasian - NOS. There is no known Jewish ancestry and no consanguinity.  Discussion: We reviewed the characteristics, features and inheritance patterns of hereditary cancer syndromes. We discussed her risk of harboring a mutation in the context of her personal and family history. We discussed the process of genetic testing, insurance coverage and implications of results: positive, negative and variant of unknown significance (VUS).   Mariah Hogan questions were answered to her satisfaction today and she is welcome to call with any additional questions or concerns. Thank you for the referral and allowing [place] to share in the care of your patient.    Steele Berg, MS, [place] Certified Genetic Counselor phone: [phone]

## 2017-08-14 ENCOUNTER — Other Ambulatory Visit: Payer: Medicare Other

## 2017-08-16 ENCOUNTER — Encounter: Payer: Self-pay | Admitting: Hematology and Oncology

## 2017-08-17 ENCOUNTER — Ambulatory Visit
Admission: RE | Admit: 2017-08-17 | Discharge: 2017-08-17 | Disposition: A | Discharge status: Home / Self Care | Payer: Medicare Other | Source: Ambulatory Visit | Attending: Urgent Care | Admitting: Urgent Care

## 2017-08-17 DIAGNOSIS — C50912 Malignant neoplasm of unspecified site of left female breast: Secondary | ICD-10-CM

## 2017-08-17 DIAGNOSIS — Z13820 Encounter for screening for osteoporosis: Secondary | ICD-10-CM | POA: Diagnosis not present

## 2017-08-17 DIAGNOSIS — M85852 Other specified disorders of bone density and structure, left thigh: Secondary | ICD-10-CM | POA: Insufficient documentation

## 2017-08-17 DIAGNOSIS — R911 Solitary pulmonary nodule: Secondary | ICD-10-CM | POA: Diagnosis not present

## 2017-08-17 DIAGNOSIS — I700 Atherosclerosis of aorta: Secondary | ICD-10-CM | POA: Diagnosis not present

## 2017-08-19 ENCOUNTER — Telehealth: Payer: Self-pay | Admitting: Genetic Counselor

## 2017-08-19 NOTE — Telephone Encounter (Signed)
Cancer Genetics             Telegenetics Results Disclosure   Patient Name: Mariah Hogan Patient DOB: 04/17/1950 Patient Age: 67 y.o. Phone Call Date: 08/19/2017  Referring Provider: Nolon Stalls, MD Mariah Loh, NP   Mariah Hogan was called today to discuss genetic test results. Please see the Genetics telephone note from 08/13/2017 for a detailed discussion of her personal and family histories and the recommendations provided.  Genetic Testing: At the time of Mariah Hogan' telegenetics visit, she decided to pursue genetic testing of 9 genes that may be used to help guide treatment decisions. Once that test was completed, additional genes on the Multi-Cancer panel were analyzed. Both results were available on the same day. Testing which included sequencing and deletion/duplication analysis. Testing did not reveal a pathogenic mutation in any of the genes analyzed. A copy of the genetic test report will be scanned into Epic under the Media tab.  The genes analyzed were the 83 genes on [facility]'s Multi-Cancer panel (ALK, APC, ATM, AXIN2, BAP1, BARD1, BLM, BMPR1A, BRCA1, BRCA2, BRIP1, CASR, CDC73, CDH1, CDK4, CDKN1B, CDKN1C, CDKN2A, CEBPA, CHEK2, CTNNA1, DICER1, DIS3L2, EGFR, EPCAM, FH, FLCN, GATA2, GPC3, GREM1, HOXB13, HRAS, KIT, MAX, MEN1, MET, MITF, MLH1, MSH2, MSH3, MSH6, MUTYH, NBN, NF1, NF2, NTHL1, PALB2, PDGFRA, PHOX2B, PMS2, POLD1, POLE, POT1, PRKAR1A, PTCH1, PTEN, RAD50, RAD51C, RAD51D, RB1, RECQL4, RET, RUNX1, SDHA, SDHAF2, SDHB, SDHC, SDHD, SMAD4, SMARCA4, SMARCB1, SMARCE1, STK11, SUFU, TERC, TERT, TMEM127, TP53, TSC1, TSC2, VHL, WRN, WT1).  Since the current test is not perfect, it is possible that there may be a gene mutation that current testing cannot detect, but that chance is small. It is possible that a different genetic factor, which has not yet been discovered or is not on this panel, is responsible for the cancer diagnoses in the family. Again, the  likelihood of this is low. No additional testing is recommended at this time for Mariah Hogan.  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 This is still considered a normal result. While at this time, it is unknown if this finding is associated with increased cancer risk, the majority of these variants get reclassified to be inconsequential. Medical management should not be based on this finding. With time, we suspect the lab will determine the significance, if any. If we do learn more about it, we will try to contact Mariah Hogan to discuss it further. It is important to stay in touch with [place] periodically and keep the address and phone number up to date.  Cancer Screening:These results suggest that Mariah Hogan' cancer was most likely not due to an inherited predisposition. Most cancers happen by chance and this test, along with details of her family history, suggests that her cancer falls into this category. She is recommended to follow the cancer screening guidelines provided by her physician.   Family Members: Family members are at some increased risk of developing cancer, over the general population risk, simply due to the family history. They are recommended to speak with their own providers about appropriate cancer screenings.   Any relative who had cancer at a young age or had a particularly rare cancer may also wish to pursue genetic testing. Genetic counselors can be located in other cities, by visiting the website of the [employer] (ArtistMovie.se) and Field seismologist for a genetic  counselor by zip code.   Family members are not recommended to get tested for the above VUS outside of a research protocol as this finding has no implications for their medical management.    Lastly, cancer genetics is a rapidly advancing field and it is possible that new genetic tests will be appropriate for Mariah Hogan in the future. Mariah Hogan is encouraged to  remain in contact with Genetics on an annual basis so we can update her personal and family histories, and let her know of advances in cancer genetics that may benefit the family. Mariah Hogan' questions were answered to her satisfaction today, and she knows she is welcome to call anytime with additional questions.    Steele Berg, MS, [place] Certified Genetic Counselor phone: [phone]

## 2017-08-20 ENCOUNTER — Telehealth: Payer: Self-pay | Admitting: *Deleted

## 2017-08-20 ENCOUNTER — Ambulatory Visit
Admission: RE | Admit: 2017-08-20 | Discharge: 2017-08-20 | Disposition: A | Discharge status: Home / Self Care | Payer: Medicare Other | Source: Ambulatory Visit | Attending: Urgent Care | Admitting: Urgent Care

## 2017-08-20 DIAGNOSIS — C50912 Malignant neoplasm of unspecified site of left female breast: Secondary | ICD-10-CM

## 2017-08-20 MED ORDER — GADOBENATE DIMEGLUMINE 529 MG/ML IV SOLN
13.0000 mL | Freq: Once | INTRAVENOUS | Status: AC | PRN
  Administered 2017-08-20: 13 mL via INTRAVENOUS

## 2017-08-20 NOTE — Telephone Encounter (Addendum)
Daughter called asking for CT results from 6/10. This patient has no follow up appointments at this time. Please return call [phone]  CLINICAL DATA:  Followup lung nodule.  EXAM: CT CHEST WITHOUT CONTRAST  TECHNIQUE: Multidetector CT imaging of the chest was performed following the standard protocol without IV contrast.  COMPARISON:  09/23/2016  FINDINGS: Cardiovascular: The heart size appears normal. Small pericardial effusion versus thickening is similar to previous exam. Aortic atherosclerosis.  Mediastinum/Nodes: Normal appearance of the thyroid gland. The trachea appears patent and is midline. Normal appearance of the esophagus. No supraclavicular or axillary adenopathy.  Lungs/Pleura: No pleural effusion. No airspace consolidation, atelectasis or pneumothorax. Stable 4 mm nodule within the periphery of the right upper lobe, image 38/2. None no additional pulmonary nodules identified.  Upper Abdomen: Mild hepatic steatosis. Previous cholecystectomy. No acute abnormality noted.  Musculoskeletal: Left breast lesion containing biopsy clip measures 1.9 cm, image 51/6. 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 No aggressive lytic or sclerotic bone lesions.  IMPRESSION: 1. Small 4 mm right upper lobe lung nodule is unchanged when compared with the previous exam. 2. Left breast lesion containing biopsy clip noted 3.  Aortic Atherosclerosis (ICD10-I70.0).   Electronically Signed   By: Kerby Moors M.D.   On: 08/17/2017 13:57

## 2017-08-20 NOTE — Telephone Encounter (Signed)
Waiting on results from Dr. Bary Castilla [facility]). Everything else is back. Once we receive these results, we will call her with an appointment to come back in to review. There are findings that do not need to be discussed over the phone. I will follow up with surgery to see if they can tell many anything. When I talked to the company, they were expecting result release on 08/20/2017.

## 2017-08-24 ENCOUNTER — Inpatient Hospital Stay (HOSPITAL_BASED_OUTPATIENT_CLINIC_OR_DEPARTMENT_OTHER): Payer: Medicare Other | Admitting: Hematology and Oncology

## 2017-08-24 ENCOUNTER — Encounter: Payer: Self-pay | Admitting: Hematology and Oncology

## 2017-08-24 VITALS — BP 124/79 | HR 80 | Temp 98.7°F | Resp 18 | Wt 164.4 lb

## 2017-08-24 DIAGNOSIS — M85852 Other specified disorders of bone density and structure, left thigh: Secondary | ICD-10-CM

## 2017-08-24 DIAGNOSIS — R911 Solitary pulmonary nodule: Secondary | ICD-10-CM

## 2017-08-24 DIAGNOSIS — C50912 Malignant neoplasm of unspecified site of left female breast: Secondary | ICD-10-CM

## 2017-08-24 DIAGNOSIS — M8580 Other specified disorders of bone density and structure, unspecified site: Secondary | ICD-10-CM | POA: Insufficient documentation

## 2017-08-24 DIAGNOSIS — C50412 Malignant neoplasm of upper-outer quadrant of left female breast: Secondary | ICD-10-CM | POA: Diagnosis not present

## 2017-08-24 DIAGNOSIS — Z170 Estrogen receptor positive status [ER+]: Secondary | ICD-10-CM

## 2017-08-24 DIAGNOSIS — R928 Other abnormal and inconclusive findings on diagnostic imaging of breast: Secondary | ICD-10-CM | POA: Insufficient documentation

## 2017-08-24 DIAGNOSIS — Z5111 Encounter for antineoplastic chemotherapy: Secondary | ICD-10-CM | POA: Diagnosis not present

## 2017-08-24 NOTE — Progress Notes (Signed)
Patient has had some nausea and diarrhea.  Biggest complaint today is severe left sided headache.  States she has had to go to bed several times because of pain.

## 2017-08-24 NOTE — Patient Instructions (Signed)

## 2017-08-24 NOTE — Progress Notes (Signed)
Mariah Hogan  Chief Complaint: Mariah Hogan is a 67 y.o. female with clinical stage IA left breast cancer on neoadjuvant Femara who is seen for review of work-up and discussion regarding direction of therapy.  HPI:  The patient was last seen in the medical oncology clinic on 08/12/2017 for initial consultation.  She was diagnosed with a rapidly evolving left breast cancer.  She was considering mastectomy and immediate reconstruction (planned for 09/18/2017).  We discussed Mammoprint or Oncotype DX.  She was started on Femara.  She had a significant family history of breast cancer.  She spoke with the genetic counselor, Mariah Hogan.  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  No intervention was felt necessary.  She underwent follow-up chest CT for a history of small lung nodule. Chest CT on 08/17/2017 revealed a small 4 mm RUL nodule unchanged in size.  Bone density on 08/17/2017 revealed osteopenia with a T-score of -1.5 in the left femoral neck and -0.7 in the AP spine L1-L4.  Bilateral breast MRI on 08/20/2017 revealed a 2.4 cm enhancing irregular mass in the outer left breast.  There were several prominent lymph nodes with borderline thicked cortices between 3-4 mm, but normal morphology.  Left axillary ultrasound was recommended.  There was a 5 mm enhancing mass in the anterior, retroareolar region of the right breast.  MRI guided core biopsy was recommended.  During the interim, patient has had nausea and diarrhea. She has had a few days of non-specific headaches. Patient denies night sweats and vasomotor symptoms. She feels "queasy" after she takes her Femara.   Patient denies B symptoms and interval infections. Patient maintains an adequate appetite, and notes that she is eating well. Weight, compared to her last visit to the clinic, has decreased  by 3  pounds.   Patient denies pain in the clinic today.   Past Medical History:  Diagnosis Date  . Arthritis   . Asthma   . Breast cancer ([place]) 08/04/2017   left breast INVASIVE DUCTAL CARCINOMA.  . Cancer ([place])    [phone] basal cell carcinoma  . GERD (gastroesophageal reflux disease)   . Hyperlipidemia   . Hypertension     Past Surgical History:  Procedure Laterality Date  . ABDOMINAL HYSTERECTOMY  1998  . bladder tack  [id number]  . BREAST BIOPSY Bilateral   . BREAST BIOPSY Left 1990  . BREAST BIOPSY Left 2018   core bx- neg  . 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  Marland Kitchen BREAST CYST EXCISION  x3  . BREAST SURGERY Left 02/13/14   Fibrocystic changes, pseudo-angiomatous stromal hyperplasia. No atypia or malignancy.  . CARPAL TUNNEL RELEASE  x2  . CHOLECYSTECTOMY  2008  . COLONOSCOPY WITH PROPOFOL N/A 01/12/2015   Procedure: COLONOSCOPY WITH PROPOFOL;  Surgeon: Manya Silvas, MD;  Location: [facility] ENDOSCOPY;  Service: Endoscopy;  Laterality: N/A;  . ESOPHAGOGASTRODUODENOSCOPY (EGD) WITH PROPOFOL N/A 01/12/2015   Procedure: ESOPHAGOGASTRODUODENOSCOPY (EGD) WITH PROPOFOL;  Surgeon: Manya Silvas, MD;  Location: [facility] ENDOSCOPY;  Service: Endoscopy;  Laterality: N/A;  . NASAL RECONSTRUCTION  1983  . SAVORY DILATION N/A 01/12/2015   Procedure: SAVORY DILATION;  Surgeon: Manya Silvas, MD;  Location: [facility] ENDOSCOPY;  Service: Endoscopy;  Laterality: N/A;  . TONSILLECTOMY AND ADENOIDECTOMY  1956    Family History  Problem Relation Age of Onset  .  Breast cancer Sister 1       currently 60  . Breast cancer Paternal Grandmother 49       bilateral; deceased 30s   . Breast cancer Cousin 29       daughter of an unaffected maternal aunt  . Breast cancer Cousin 49       kidney cancer also; daughter of an unaffected maternal aunt  . Other Mother        TAH/BSO prior to menopause  . Prostate cancer Paternal Grandfather        age at  dx unknown  . Breast cancer Maternal Aunt        age at dx unknown  . Breast cancer Cousin 25       daughter of an unaffected maternal aunt    Social History:  reports that she has never smoked. She has never used smokeless tobacco. She reports that she does not drink alcohol or use drugs.  Her daughter is Mariah Hogan, [place]. She is a retired Customer service manager for 35 years. Patient denies known exposures to radiation on toxins. The patient is accompanied by her daughter, Mariah Hogan, today.  Allergies:  Allergies  Allergen Reactions  . Penicillin G Rash, Shortness Of Breath and Swelling  . Fluocinonide   . Other Other (See Comments)    Dodecyl gallate: Positive patch test  Elta MD UV Clear SPF 46: Positive patch test  fragrance dodocylgallate dodocylgallate  . Parabens Other (See Comments)    Positive patch test   . Penicillins   . Shellac   . Triamcinolone Acetonide   . Triamcinolone Rash    Current Medications: Current Outpatient Medications  Medication Sig Dispense Refill  . atenolol (TENORMIN) 50 MG tablet Take 50 mg by mouth daily.    . cetirizine (ZYRTEC) 10 MG tablet Take 10 mg by mouth daily.    . Cholecalciferol 1000 UNITS tablet Take 1,000 Units by mouth daily.    . citalopram (CELEXA) 20 MG tablet Take 20 mg by mouth daily.    . colestipol (COLESTID) 1 G tablet Take 1 g by mouth as needed.     Marland Kitchen letrozole (FEMARA) 2.5 MG tablet Take 1 tablet (2.5 mg total) by mouth daily. 30 tablet 0  . meloxicam (MOBIC) 15 MG tablet Take 15 mg by mouth daily.    . Multiple Vitamin (MULTIVITAMIN) tablet Take 1 tablet by mouth daily.    . pantoprazole (PROTONIX) 40 MG tablet Take 40 mg by mouth daily.    . ranitidine (ZANTAC) 300 MG tablet Take 1 tablet (300 mg total) by mouth at bedtime. 30 tablet 5  . triamterene-hydrochlorothiazide (MAXZIDE-25) 37.5-25 MG tablet Take 1 tablet by mouth daily.    . vitamin B-12 (CYANOCOBALAMIN) 1000 MCG tablet Take 1,000 mcg by mouth daily.    . vitamin C  (ASCORBIC ACID) 500 MG tablet Take 500 mg by mouth daily.    . benzonatate (TESSALON PERLES) 100 MG capsule Take 2 capsules (200 mg total) by mouth every 6 (six) hours as needed for cough. (Patient not taking: Reported on Hogan) 60 capsule 1   No current facility-administered medications for this visit.     Review of Systems  Constitutional: Positive for malaise/fatigue (mild) and weight loss (down 3 pounds). Negative for diaphoresis and fever.  HENT: Negative for nosebleeds and sore throat.        Hoarse voice quality for over a year  Eyes: Negative.  Negative for blurred vision, double vision, photophobia, pain and discharge.  Respiratory: Positive for cough (for over a year). Negative for hemoptysis, sputum production and shortness of breath.   Cardiovascular: Positive for palpitations (irregular heart beat on atenolol). Negative for chest pain, orthopnea, leg swelling and PND.  Gastrointestinal: Positive for diarrhea (chronic; several bowel movements/day). Negative for abdominal pain, blood in stool, constipation, melena, nausea and vomiting.  Genitourinary: Negative for dysuria, frequency, hematuria and urgency.  Musculoskeletal: Positive for joint pain (arthritis in hands and knees). Negative for back pain, falls and myalgias.  Skin: Negative for itching and rash.       History of BCC to face and leg  Neurological: Negative for dizziness, tremors, weakness and headaches.  Endo/Heme/Allergies: Does not bruise/bleed easily.  Psychiatric/Behavioral: Negative for depression, memory loss and suicidal ideas. The patient is not nervous/anxious and does not have insomnia.   All other systems reviewed and are negative.  Physical Exam: Blood pressure 124/79, pulse 80, temperature 98.7 F (37.1 C), temperature source Tympanic, resp. rate 18, weight 164 lb 6 oz (74.6 kg). GENERAL:  Well developed, well nourished, woman sitting comfortably in the exam room in no acute distress. MENTAL STATUS:   Alert and oriented to person, place and time. HEAD:  Shoulder length brown hair.  Normocephalic, atraumatic, face symmetric, no Cushingoid features. EYES:  Blue eyes.  No conjunctivitis or scleral icterus. NEUROLOGICAL: Unremarkable. PSYCH:  Appropriate.    No visits with results within 3 Day(s) from this visit.  Latest known visit with results is:  Hospital Outpatient Visit on 08/06/2017  Component Date Value Ref Range Status  . CA 27.29 08/06/2017 17.3  0.0 - 38.6 U/mL Final   Comment: (NOTE) Siemens Centaur Immunochemiluminometric Methodology [facility]) Values obtained with different assay methods or kits cannot be used interchangeably. Results cannot be interpreted as absolute evidence of the presence or absence of malignant disease. Performed At: [facility] [place], [place] [id number] Rush Farmer MD [id number] Performed at [facility], [address] [place], [place] [address]   . CEA 08/06/2017 1.1  0.0 - 4.7 ng/mL Final   Comment: (NOTE)                             Nonsmokers          <3.9                             Smokers             <5.6 Roche Diagnostics Electrochemiluminescence Immunoassay (ECLIA) Values obtained with different assay methods or kits cannot be used interchangeably.  Results cannot be interpreted as absolute evidence of the presence or absence of malignant disease. Performed At: [facility] [place], [place] [id number] Rush Farmer MD [id number] Performed at [facility], [address] [place], [place] [address]   . Sodium 08/06/2017 137  135 - 145 mmol/L Final  . Potassium 08/06/2017 3.4* 3.5 - 5.1 mmol/L Final  . Chloride 08/06/2017 102  101 - 111 mmol/L Final  . CO2 08/06/2017 25  22 - 32 mmol/L Final  . Glucose, Bld 08/06/2017 160* 65 - 99 mg/dL Final  . BUN 08/06/2017 20  6 - 20 mg/dL Final  . Creatinine, Ser 08/06/2017 0.89  0.44 - 1.00 mg/dL Final  . Calcium  08/06/2017 8.8* 8.9 - 10.3 mg/dL Final  . Total Protein 08/06/2017 6.6  6.5 - 8.1 g/dL Final  .  Albumin 08/06/2017 3.7  3.5 - 5.0 g/dL Final  . AST 08/06/2017 24  15 - 41 U/L Final  . ALT 08/06/2017 19  14 - 54 U/L Final  . Alkaline Phosphatase 08/06/2017 91  38 - 126 U/L Final  . Total Bilirubin 08/06/2017 0.6  0.3 - 1.2 mg/dL Final  . GFR calc non Af Amer 08/06/2017 >60  >60 mL/min Final  . GFR calc Af Amer 08/06/2017 >60  >60 mL/min Final   Comment: (NOTE) The eGFR has been calculated using the CKD EPI equation. This calculation has not been validated in all clinical situations. eGFR's persistently <60 mL/min signify possible Chronic Kidney Disease.   Georgiann Hahn gap 08/06/2017 10  5 - 15 Final   Performed at [facility], [address] [place], [place] [address]  . WBC 08/06/2017 5.0  3.6 - 11.0 K/uL Final  . RBC 08/06/2017 4.15  3.80 - 5.20 MIL/uL Final  . Hemoglobin 08/06/2017 13.1  12.0 - 16.0 g/dL Final  . HCT 08/06/2017 38.0  35.0 - 47.0 % Final  . MCV 08/06/2017 91.4  80.0 - 100.0 fL Final  . MCH 08/06/2017 31.4  26.0 - 34.0 pg Final  . MCHC 08/06/2017 34.4  32.0 - 36.0 g/dL Final  . RDW 08/06/2017 12.2  11.5 - 14.5 % Final  . Platelets 08/06/2017 166  150 - 440 K/uL Final  . Neutrophils Relative % 08/06/2017 57  % Final  . Neutro Abs 08/06/2017 2.9  1.4 - 6.5 K/uL Final  . Lymphocytes Relative 08/06/2017 31  % Final  . Lymphs Abs 08/06/2017 1.6  1.0 - 3.6 K/uL Final  . Monocytes Relative 08/06/2017 7  % Final  . Monocytes Absolute 08/06/2017 0.4  0.2 - 0.9 K/uL Final  . Eosinophils Relative 08/06/2017 4  % Final  . Eosinophils Absolute 08/06/2017 0.2  0 - 0.7 K/uL Final  . Basophils Relative 08/06/2017 1  % Final  . Basophils Absolute 08/06/2017 0.0  0 - 0.1 K/uL Final   Performed at [facility], [address] [place], [place] [address]    Assessment:  ZIOMARA BIRENBAUM is a 67 y.o. female with clinical stage IA left breast cancer s/p biopsy on  08/04/2017.  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  Tumor was ER + (95%), PR+ (20%), and Her 2 neu - by FISH.  Ki67 was 80%.  CA27.29 was 17.3 on 08/06/2017.  Left breast ultrasound on 08/04/2017 revealed a 1.65 x 1.69 x 1.86 cm multi-lobulated hypoechoic mass in the upper outer quadrant of the left breast in the 2 o'clock position 7 cm from the nipple.  Bilateral breast MRI on 08/20/2017 revealed a 2.4 cm enhancing irregular mass in the outer left breast.  There were several prominent lymph nodes with borderline thicked cortices between 3-4 mm, but normal morphology.  There was a 5 mm enhancing mass in the anterior, retroareolar region of the right breast.  Chest CT on 09/23/2016 revealed a 4 mm RUL subpleural nodule.  Chest CT on 08/17/2017 revealed a small 4 mm RUL nodule unchanged in size.  She began neoadjuvant Femara on 08/12/2017 secondary to a delay in surgery (surgical excision and reconstruction).  Bone density on 08/17/2017 revealed osteopenia with a T-score of -1.5 in the left femoral neck and -0.7 in the AP spine L1-L4.  She has a significant family history of breast cancer (paternal grandmother, sister, and 3 maternal cousins).   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  Symptomatically, she notes nausea and diarrhea.  She feels queasy after taking Femara.   Plan: 1. Discuss interval genetic testing- variant of unknown significance. 2. Discuss interval chest CT- stable 85m RUL nodule. 3. Discuss interval bone density- osteopenia.  Discuss calcium and vitamin D.  Discuss the use of Prolia to prevent bone thinning/loss associated with endocrine therapy. Side effects of this medication reviewed. Patient provided with printed information on Prolia on today's AVS. Patient encouraged to take calcium 1200 mg and vitamin D 800 IU daily. Discussed the need for dental clearance  prior to beginning Prolia, as this medication is associated with an increased risk of dental complications such as osteonecrosis of the jaw.  4. Discuss interval breast MRI.  Discuss left sided lesion and prominent lymph nodes.  Discuss small enhancing right retroareolar mass. Will discuss recommended MRI guided biopsy with surgery.  5. Phone follow-up with Dr. BBary Castilla Awaiting Mammaprint results. Result due 08/26/2017 per resulting company.  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 Tumor is ER (+), PR (+), and Her2/neu (-); clinical stage I. Proliferation marker Ki67 was high at 80%.  Tumor has rapidly developed.  She is considering mastectomy with immediate reconstruction (surgery date tentatively scheduled for 09/18/2017 secondary to coordination with Dr. BBary Castillaand Dr. CAudelia Hives plastic surgery).    Medical oncology - patient is on Femara.  Discuss neoadjuvant TC chemotherapy with Neulasta support if  Mammaprint is high risk. Discuss side effects associated with the proposed chemotherapy regimen.    Surgical - patient has not fully decided on surgical intervention (lumpectomy vs. mastectomy). She is aware that lumpectomy will necessitate post surgical radiation therapy.  6. RTC will be based on pending Mammaprint - will call with an appointment.   Addendum:  Mammaprint is high risk luminal-type B with a 10 year risk of recurrence untreated of 29% and 94.6% of distant metastasis free interval with chemotherapy + hormonal therapy.   BHonor Loh NP  Hogan, 2:41 PM   I saw and evaluated the patient, participating in the key portions of the service and reviewing pertinent diagnostic studies and records.  I reviewed the nurse practitioner's note and agree with the findings and the plan.  The assessment and plan were discussed with the patient.  Multiple questions were asked by the patient and answered.   MNolon Stalls MD Hogan,2:41 PM

## 2017-08-24 NOTE — Progress Notes (Signed)
  Oncology Nurse Navigator Documentation  Navigator Location: [facility]-Med Onc (08/24/17 1500)   )Navigator Encounter Type: Initial MedOnc (08/24/17 1500)                     Patient Visit Type: Initial;MedOnc (08/24/17 1500)   Barriers/Navigation Needs: Education;Coordination of Care (08/24/17 1500) Education: Preparing for Upcoming Surgery/ Treatment;Understanding Cancer/ Treatment Options (08/24/17 1500)                        Time Spent with Patient: 30 (08/24/17 1500)   Met patient and daughter at initial Med/Onc visit.  Introduced to IT trainer.  Given Breast Cancer Treatment Handbook/folder with hospital services.

## 2017-08-25 ENCOUNTER — Telehealth: Payer: Self-pay | Admitting: General Surgery

## 2017-08-25 NOTE — Telephone Encounter (Signed)
Patient notified of high risk Mammoprint results.  Will review in detail at follow up on August 27, 2017.

## 2017-08-26 ENCOUNTER — Telehealth: Payer: Self-pay | Admitting: *Deleted

## 2017-08-26 NOTE — Telephone Encounter (Signed)
Per Gaspar Bidding 08/26/17 staff message [id number] for chemotherapy education class. She will be getting TC chemo. More to come on start date.   She is scheduled for chemotherapy education on 08/31/17 @ 9:00.  Patient is aware of date and time of her appt.

## 2017-08-26 NOTE — Telephone Encounter (Signed)
I have a copy of the mammaprint results. I have scheduled her for chemo class on 08/31/2017. She sees surgery tomorrow (06/20).   Pending agenda items: 1. Will she need a port? When can it be done? 2. Enter TC chemo orders for PA. 3. Schedule start day.

## 2017-08-26 NOTE — Telephone Encounter (Signed)
  Let's get a copy of the Mammaprint result.  We previously talked Taxotere and Cytoxan x 4.  I can put in orders. Preauth. Chemo class. I don't know if she will ned a port. Schedule to start treatment. Postpone surgery.  M

## 2017-08-27 ENCOUNTER — Ambulatory Visit (INDEPENDENT_AMBULATORY_CARE_PROVIDER_SITE_OTHER): Payer: Medicare Other | Admitting: General Surgery

## 2017-08-27 ENCOUNTER — Ambulatory Visit: Payer: Self-pay

## 2017-08-27 ENCOUNTER — Encounter: Payer: Self-pay | Admitting: General Surgery

## 2017-08-27 ENCOUNTER — Other Ambulatory Visit: Payer: Self-pay | Admitting: General Surgery

## 2017-08-27 VITALS — BP 124/64 | HR 73 | Resp 14 | Ht 66.5 in | Wt 164.0 lb

## 2017-08-27 DIAGNOSIS — N6310 Unspecified lump in the right breast, unspecified quadrant: Secondary | ICD-10-CM

## 2017-08-27 DIAGNOSIS — C50912 Malignant neoplasm of unspecified site of left female breast: Secondary | ICD-10-CM

## 2017-08-27 NOTE — Patient Instructions (Addendum)
The patient is aware to call back for any questions or concerns.  Right breast MRI biopsy at [facility].   The patient is scheduled for an MRI Guided biopsy of the right breast at [facility] on 09/02/17 at 7:10 am. The patient is aware of date, time, and instructions.

## 2017-08-27 NOTE — Progress Notes (Signed)
Patient ID: Mariah Hogan, female   DOB: 06/17/1950, 67 y.o.   MRN: [id number]  Chief Complaint  Patient presents with  . Follow-up    breast MRI    HPI Mariah Hogan is a 67 y.o. female. here for follow up breast MRI and left breast cancer and possible right breast biopsy. She is here with [name] PA, her daughter. She had been notified by phone that the MRI showed a 5 mm area in the retroareolar area of the right breast for which biopsy has been recommended.  She is also been notified that MammaPrint testing showed her to be high risk for recurrent disease without adjuvant chemotherapy.  HPI  Past Medical History:  Diagnosis Date  . Arthritis   . Asthma   . Breast cancer ([place]) 08/04/2017   left breast INVASIVE DUCTAL CARCINOMA.  . Cancer ([place])    [phone] basal cell carcinoma  . GERD (gastroesophageal reflux disease)   . Hyperlipidemia   . Hypertension     Past Surgical History:  Procedure Laterality Date  . ABDOMINAL HYSTERECTOMY  1998  . bladder tack  [id number]  . BREAST BIOPSY Bilateral   . BREAST BIOPSY Left 1990  . BREAST BIOPSY Left 2018   core bx- neg  . 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  Marland Kitchen BREAST CYST EXCISION  x3  . BREAST SURGERY Left 02/13/14   Fibrocystic changes, pseudo-angiomatous stromal hyperplasia. No atypia or malignancy.  . CARPAL TUNNEL RELEASE  x2  . CHOLECYSTECTOMY  2008  . COLONOSCOPY WITH PROPOFOL N/A 01/12/2015   Procedure: COLONOSCOPY WITH PROPOFOL;  Surgeon: Manya Silvas, MD;  Location: [facility] ENDOSCOPY;  Service: Endoscopy;  Laterality: N/A;  . ESOPHAGOGASTRODUODENOSCOPY (EGD) WITH PROPOFOL N/A 01/12/2015   Procedure: ESOPHAGOGASTRODUODENOSCOPY (EGD) WITH PROPOFOL;  Surgeon: Manya Silvas, MD;  Location: [facility] ENDOSCOPY;  Service: Endoscopy;  Laterality: N/A;  . NASAL RECONSTRUCTION  1983  . SAVORY DILATION N/A 01/12/2015   Procedure: SAVORY DILATION;  Surgeon: Manya Silvas,  MD;  Location: [facility] ENDOSCOPY;  Service: Endoscopy;  Laterality: N/A;  . TONSILLECTOMY AND ADENOIDECTOMY  1956    Family History  Problem Relation Age of Onset  . Breast cancer Sister 19       currently 50  . Breast cancer Paternal Grandmother 13       bilateral; deceased 73s   . Breast cancer Cousin 62       daughter of an unaffected maternal aunt  . Breast cancer Cousin 62       kidney cancer also; daughter of an unaffected maternal aunt  . Other Mother        TAH/BSO prior to menopause  . Prostate cancer Paternal Grandfather        age at dx unknown  . Breast cancer Maternal Aunt        age at dx unknown  . Breast cancer Cousin 2       daughter of an unaffected maternal aunt    Social History Social History   Tobacco Use  . Smoking status: Never Smoker  . Smokeless tobacco: Never Used  Substance Use Topics  . Alcohol use: No    Alcohol/week: 0.0 oz  . Drug use: No    Allergies  Allergen Reactions  . Penicillin G Rash, Shortness Of Breath and Swelling  . Fluocinonide   . Other Other (See Comments)    Dodecyl gallate: Positive patch test  Elta MD UV Clear  SPF 32: Positive patch test  fragrance dodocylgallate dodocylgallate  . Parabens Other (See Comments)    Positive patch test   . Penicillins   . Shellac   . Triamcinolone Acetonide   . Triamcinolone Rash    Current Outpatient Medications  Medication Sig Dispense Refill  . atenolol (TENORMIN) 50 MG tablet Take 50 mg by mouth daily.    . cetirizine (ZYRTEC) 10 MG tablet Take 10 mg by mouth daily.    . Cholecalciferol 1000 UNITS tablet Take 1,000 Units by mouth daily.    . citalopram (CELEXA) 20 MG tablet Take 20 mg by mouth daily.    . colestipol (COLESTID) 1 G tablet Take 1 g by mouth as needed.     Marland Kitchen letrozole (FEMARA) 2.5 MG tablet Take 1 tablet (2.5 mg total) by mouth daily. 30 tablet 0  . meloxicam (MOBIC) 15 MG tablet Take 15 mg by mouth daily.    . Multiple Vitamin (MULTIVITAMIN) tablet Take 1  tablet by mouth daily.    . pantoprazole (PROTONIX) 40 MG tablet Take 40 mg by mouth daily.    . ranitidine (ZANTAC) 300 MG tablet Take 1 tablet (300 mg total) by mouth at bedtime. 30 tablet 5  . triamterene-hydrochlorothiazide (MAXZIDE-25) 37.5-25 MG tablet Take 1 tablet by mouth daily.    . vitamin B-12 (CYANOCOBALAMIN) 1000 MCG tablet Take 1,000 mcg by mouth daily.    . vitamin C (ASCORBIC ACID) 500 MG tablet Take 500 mg by mouth daily.    . benzonatate (TESSALON PERLES) 100 MG capsule Take 2 capsules (200 mg total) by mouth every 6 (six) hours as needed for cough. (Patient not taking: Reported on 08/27/2017) 60 capsule 1   No current facility-administered medications for this visit.     Review of Systems Review of Systems  Constitutional: Negative.   Respiratory: Negative.   Cardiovascular: Negative.   Gastrointestinal: Positive for nausea.    Blood pressure 124/64, pulse 73, resp. rate 14, height 5' 6.5" (1.689 m), weight 164 lb (74.4 kg), SpO2 97 %.  Physical Exam Physical Exam  Constitutional: She is oriented to person, place, and time. She appears well-developed and well-nourished.  Pulmonary/Chest: Right breast exhibits no inverted nipple, no mass, no nipple discharge, no skin change and no tenderness.  Neurological: She is alert and oriented to person, place, and time.  Skin: Skin is warm and dry.  Psychiatric: Her behavior is normal.    Data Reviewed Mammograms of April 10, 2017 were reviewed with several areas of density, some of which resolved on spot compression.  A very deep lesion was noted and recommended for MRI.  Review of the MRI of August 20, 2017 showed a 5 mm area in the retroareolar area of the right breast.  It is difficult to confirm that the MRI finding matches the mammographic finding.  Second look ultrasound was planned to determine if an MRI biopsy could be avoided.  The retroareolar area shows several cysts measuring up to 7 mm.  There is a  deep-seated hypoechoic/anechoic lesion which would not correspond with an enhancing nodule on imaging.  No clear-cut correlate to the mammogram or ultrasound findings.  No images, no charge.    Assessment    Abnormal right breast MRI, biopsy pending.    Plan    Proceed with right breast MRI guided biopsy.  The patient brought up the possibility of bilateral mastectomy.  If she has benign exam this would be strongly discouraged.  Even if she had a  positive biopsy considering the very tiny lesion and the significant difference in recovery from a 4-hour operation versus an 8-hour operation needs to be considered.  We will review this when biopsy results are available.  This time, we will cancel our planned reconstructive date for later this month.  Case reviewed with Dr. Mike Gip for medical oncology.  Plans are for neoadjuvant chemotherapy prior to left mastectomy with immediate reconstruction.  Port will not be required as the patient has excellent upper arm veins.  Port can be inserted during treatment if needed.    HPI, Physical Exam, Assessment and Plan have been scribed under the direction and in the presence of Robert Bellow, MD. Karie Fetch, RN  I have completed the exam and reviewed the above documentation for accuracy and completeness.  I agree with the above.  Haematologist has been used and any errors in dictation or transcription are unintentional.  Hervey Ard, M.D., F.A.C.S.  The patient is scheduled for an MRI Guided biopsy of the right breast at [facility] on 09/02/17 at 7:10 am. The patient is aware of date, time, and instructions.  Documented by Caryl-Lyn Otis Brace LPN  Forest Gleason Ravina Milner 08/27/2017, 9:10 AM

## 2017-08-27 NOTE — Patient Instructions (Signed)

## 2017-08-28 ENCOUNTER — Other Ambulatory Visit: Payer: Self-pay | Admitting: Hematology and Oncology

## 2017-08-28 ENCOUNTER — Encounter (HOSPITAL_COMMUNITY): Payer: Self-pay | Admitting: General Surgery

## 2017-08-28 NOTE — Progress Notes (Signed)
START ON PATHWAY REGIMEN - Breast     A cycle is every 21 days:     Docetaxel      Cyclophosphamide   **Always confirm dose/schedule in your pharmacy ordering system**  Patient Characteristics: Preoperative or Nonsurgical Candidate (Clinical Staging), Neoadjuvant Therapy followed by Surgery, Invasive Disease, Chemotherapy, HER2 Negative/Unknown/Equivocal, ER Positive Therapeutic Status: Preoperative or Nonsurgical Candidate (Clinical Staging) AJCC M Category: cM0 AJCC Grade: G2 Breast Surgical Plan: Neoadjuvant Therapy followed by Surgery ER Status: Positive (+) AJCC 8 Stage Grouping: IA HER2 Status: Negative (-) AJCC T Category: cT1c AJCC N Category: cN0 PR Status: Positive (+) Intent of Therapy: Curative Intent, Discussed with Patient

## 2017-08-30 ENCOUNTER — Encounter: Payer: Self-pay | Admitting: Hematology and Oncology

## 2017-08-30 MED ORDER — ONDANSETRON HCL 8 MG PO TABS: 8.0000 mg | ORAL_TABLET | Freq: Two times a day (BID) | ORAL | 1 refills | Status: DC | PRN

## 2017-08-31 ENCOUNTER — Other Ambulatory Visit: Payer: Medicare Other

## 2017-08-31 ENCOUNTER — Other Ambulatory Visit: Payer: Self-pay

## 2017-08-31 ENCOUNTER — Encounter: Payer: Self-pay | Admitting: Urgent Care

## 2017-08-31 ENCOUNTER — Inpatient Hospital Stay: Payer: Medicare Other

## 2017-08-31 DIAGNOSIS — C50912 Malignant neoplasm of unspecified site of left female breast: Secondary | ICD-10-CM

## 2017-08-31 MED ORDER — DEXAMETHASONE 4 MG PO TABS: 8.0000 mg | ORAL_TABLET | Freq: Two times a day (BID) | ORAL | 1 refills | Status: DC

## 2017-09-02 ENCOUNTER — Ambulatory Visit
Admission: RE | Admit: 2017-09-02 | Discharge: 2017-09-02 | Disposition: A | Discharge status: Home / Self Care | Payer: Medicare Other | Source: Ambulatory Visit | Attending: General Surgery | Admitting: General Surgery

## 2017-09-02 ENCOUNTER — Other Ambulatory Visit: Payer: Self-pay | Admitting: *Deleted

## 2017-09-02 ENCOUNTER — Other Ambulatory Visit: Payer: Self-pay | Admitting: Hematology and Oncology

## 2017-09-02 DIAGNOSIS — C50912 Malignant neoplasm of unspecified site of left female breast: Secondary | ICD-10-CM

## 2017-09-02 DIAGNOSIS — N6321 Unspecified lump in the left breast, upper outer quadrant: Secondary | ICD-10-CM

## 2017-09-02 DIAGNOSIS — N6310 Unspecified lump in the right breast, unspecified quadrant: Secondary | ICD-10-CM

## 2017-09-02 HISTORY — PX: MINOR BREAST BIOPSY: SHX5977

## 2017-09-02 MED ORDER — GADOBENATE DIMEGLUMINE 529 MG/ML IV SOLN
13.0000 mL | Freq: Once | INTRAVENOUS | Status: AC | PRN
  Administered 2017-09-02: 13 mL via INTRAVENOUS

## 2017-09-02 NOTE — Progress Notes (Signed)
[facility] day:  09/03/2017  Chief Complaint: Mariah Hogan is a 67 y.o. female with clinical stage IA left breast cancer who is seen for assessment prior to cycle #1 TC chemotherapy.  HPI:  The patient was last seen in the medical oncology clinic on 08/24/2017.  At that time, patient complained of nausea, diarrhea, and nonspecific headaches.  Patient noted that she felt "queasy" after taking the prescribed Femara.  She denied B symptoms.  Weight had decreased by 3 pounds.  Exam was stable.  Reviewed interval breast MRI.  We were awaiting Mammaprint results.  Subsequently, MammaPrint results became available- high risk luminal-type B with a 10 year risk of recurrence untreated of 29% and 94.6% of distant metastasis free interval with chemotherapy + hormonal therapy.  Patient attended chemotherapy education class with Magdalene Patricia, RN on 08/31/2017. She was provided with written information regarding her proposed course of chemotherapy treatments. Patient was given the opportunity to have her preliminary clinical questions fielded by cancer center nurse navigator.   She was seen in follow up consult on 08/27/2017 by Dr. Hervey Ard (surgery).  Notes reviewed.  Recommended to proceed with suggested RIGHT breast MRI guided biopsy, which she will have on 09/02/2017.  Discussed port placement, however patient noted to have adequate upper extremity vasculature for PIV placement, and therefore elected to forego port placement.   Patient underwent MRI guided RIGHT breast biopsy on 09/02/2017.  Procedure complicated by clip migration from original position.  Pathology is pending.  In the interim, patient is doing well today. She has a continued cough and hoarse voice quality. Symptoms persistent since 03/2016. She has been seen by pulmonary medicine and ENT.  She denies sputum production and fever.   Patient denies B symptoms. She has not experienced any  significant interval infections. Femara was stopped on 08/31/2017. Patient notes that she is eating well.  Weight has remained stable.   Patient denies pain in the clinic today.     Past Medical History:  Diagnosis Date  . Arthritis   . Asthma   . Breast cancer ([employer]) 08/04/2017   left breast INVASIVE DUCTAL CARCINOMA.  . Cancer ([facility])    [phone] basal cell carcinoma  . GERD (gastroesophageal reflux disease)   . Hyperlipidemia   . Hypertension     Past Surgical History:  Procedure Laterality Date  . ABDOMINAL HYSTERECTOMY  1998  . bladder tack  [id number]  . BREAST BIOPSY Bilateral   . BREAST BIOPSY Left 1990  . BREAST BIOPSY Left 2018   core bx- neg  . 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  Marland Kitchen BREAST CYST EXCISION  x3  . BREAST SURGERY Left 02/13/14   Fibrocystic changes, pseudo-angiomatous stromal hyperplasia. No atypia or malignancy.  . CARPAL TUNNEL RELEASE  x2  . CHOLECYSTECTOMY  2008  . COLONOSCOPY WITH PROPOFOL N/A 01/12/2015   Procedure: COLONOSCOPY WITH PROPOFOL;  Surgeon: Manya Silvas, MD;  Location: [facility] ENDOSCOPY;  Service: Endoscopy;  Laterality: N/A;  . ESOPHAGOGASTRODUODENOSCOPY (EGD) WITH PROPOFOL N/A 01/12/2015   Procedure: ESOPHAGOGASTRODUODENOSCOPY (EGD) WITH PROPOFOL;  Surgeon: Manya Silvas, MD;  Location: [facility] ENDOSCOPY;  Service: Endoscopy;  Laterality: N/A;  . NASAL RECONSTRUCTION  1983  . SAVORY DILATION N/A 01/12/2015   Procedure: SAVORY DILATION;  Surgeon: Manya Silvas, MD;  Location: [facility] ENDOSCOPY;  Service: Endoscopy;  Laterality: N/A;  . [place]  History  Problem Relation Age of Onset  . Breast cancer Sister 56       currently 77  . Breast cancer Paternal Grandmother 76       bilateral; deceased 77s   . Breast cancer Cousin 16       daughter of an unaffected maternal aunt  . Breast cancer Cousin 12       kidney cancer also; daughter of an  unaffected maternal aunt  . Other Mother        TAH/BSO prior to menopause  . Prostate cancer Paternal Grandfather        age at dx unknown  . Breast cancer Maternal Aunt        age at dx unknown  . Breast cancer Cousin 5       daughter of an unaffected maternal aunt    Social History:  reports that she has never smoked. She has never used smokeless tobacco. She reports that she does not drink alcohol or use drugs.  Her daughter is Mariah Hogan, [place]. She is a retired Customer service manager for 35 years. Patient denies known exposures to radiation on toxins. Her daughter's name is Mariah Hogan.  She is alone today.  Allergies:  Allergies  Allergen Reactions  . Penicillin G Rash, Shortness Of Breath and Swelling  . Fluocinonide   . Other Other (See Comments)    Dodecyl gallate: Positive patch test  Elta MD UV Clear SPF 46: Positive patch test  fragrance dodocylgallate dodocylgallate  . Parabens Other (See Comments)    Positive patch test   . Penicillins   . Shellac   . Triamcinolone Acetonide   . Triamcinolone Rash    Current Medications: Current Outpatient Medications  Medication Sig Dispense Refill  . atenolol (TENORMIN) 50 MG tablet Take 50 mg by mouth daily.    . cetirizine (ZYRTEC) 10 MG tablet Take 10 mg by mouth daily.    . Cholecalciferol 1000 UNITS tablet Take 1,000 Units by mouth daily.    . citalopram (CELEXA) 20 MG tablet Take 20 mg by mouth daily.    . Multiple Vitamin (MULTIVITAMIN) tablet Take 1 tablet by mouth daily.    . pantoprazole (PROTONIX) 40 MG tablet Take 40 mg by mouth daily.    . ranitidine (ZANTAC) 300 MG tablet Take 1 tablet (300 mg total) by mouth at bedtime. 30 tablet 5  . triamterene-hydrochlorothiazide (MAXZIDE-25) 37.5-25 MG tablet Take 1 tablet by mouth daily.    . benzonatate (TESSALON PERLES) 100 MG capsule Take 2 capsules (200 mg total) by mouth every 6 (six) hours as needed for cough. (Patient not taking: Reported on 08/27/2017) 60 capsule 1  . colestipol  (COLESTID) 1 G tablet Take 1 g by mouth as needed.     Marland Kitchen dexamethasone (DECADRON) 4 MG tablet Take 2 tablets (8 mg total) by mouth 2 (two) times daily. Start the day before Taxotere. Then again the day after chemo for 3 days. (Patient not taking: Reported on 09/03/2017) 30 tablet 1  . letrozole (FEMARA) 2.5 MG tablet Take 1 tablet (2.5 mg total) by mouth daily. (Patient not taking: Reported on 09/03/2017) 30 tablet 0  . meloxicam (MOBIC) 15 MG tablet Take 15 mg by mouth daily.    . ondansetron (ZOFRAN) 8 MG tablet Take 1 tablet (8 mg total) by mouth 2 (two) times daily as needed for refractory nausea / vomiting. Start on day 3 after chemo. (Patient not taking: Reported on 09/03/2017) 30 tablet 1  . vitamin B-12 (  CYANOCOBALAMIN) 1000 MCG tablet Take 1,000 mcg by mouth daily.    . vitamin C (ASCORBIC ACID) 500 MG tablet Take 500 mg by mouth daily.     No current facility-administered medications for this visit.     Review of Systems  Constitutional: Positive for malaise/fatigue (mild). Negative for diaphoresis, fever and weight loss.  HENT: Negative for nosebleeds and sore throat.        Hoarse voice quality for every year  Eyes: Negative.   Respiratory: Positive for cough (chronic). Negative for hemoptysis, sputum production and shortness of breath.   Cardiovascular: Positive for palpitations. Negative for chest pain, orthopnea, leg swelling and PND.  Gastrointestinal: Positive for diarrhea (chronic). Negative for abdominal pain, blood in stool, constipation, melena, nausea and vomiting.  Genitourinary: Negative for dysuria, frequency, hematuria and urgency.  Musculoskeletal: Positive for joint pain (hands and feet). Negative for back pain, falls and myalgias.  Skin: Negative for itching and rash.       Bruising to RIGHT breast from recent biopsy. History of BCC to face and leg.   Neurological: Negative for dizziness, tremors, weakness and headaches.  Endo/Heme/Allergies: Does not bruise/bleed  easily.  Psychiatric/Behavioral: Negative for depression, memory loss and suicidal ideas. The patient is not nervous/anxious and does not have insomnia.   All other systems reviewed and are negative.  Performance status (ECOG): 1 - Symptomatic but completely ambulatory  Physical Exam: Blood pressure 116/69, pulse 75, temperature (!) 97.4 F (36.3 C), temperature source Tympanic, resp. rate 18, height 5' 5.6" (1.666 m), weight 164 lb (74.4 kg), SpO2 97 %. GENERAL:  Well developed, well nourished, woman sitting comfortably in the exam room in no acute distress. MENTAL STATUS:  Alert and oriented to person, place and time. HEAD:  Shoulder length brown hair.  Normocephalic, atraumatic, face symmetric, no Cushingoid features. EYES:  Blue eyes.  Pupils equal round and reactive to light and accomodation.  No conjunctivitis or scleral icterus. ENT:  Oropharynx clear without lesion.  Tongue normal. Mucous membranes moist.  RESPIRATORY:  Clear to auscultation without rales, wheezes or rhonchi. CARDIOVASCULAR:  Regular rate and rhythm without murmur, rub or gallop. BREASTS:  Right breast ecchymosis medially s/p biopsy.  Steri-strip starred area laterally. ABDOMEN:  Soft, non-tender, with active bowel sounds, and no hepatosplenomegaly.  No masses. SKIN:  No rashes, ulcers or lesions. EXTREMITIES: No edema, no skin discoloration or tenderness.  No palpable cords. LYMPH NODES: No palpable cervical, supraclavicular, axillary or inguinal adenopathy  NEUROLOGICAL: Unremarkable. PSYCH:  Appropriate.    Appointment on 09/03/2017  Component Date Value Ref Range Status  . Sodium 09/03/2017 136  135 - 145 mmol/L Final  . Potassium 09/03/2017 4.0  3.5 - 5.1 mmol/L Final  . Chloride 09/03/2017 103  98 - 111 mmol/L Final   Please note change in reference range.  . CO2 09/03/2017 23  22 - 32 mmol/L Final  . Glucose, Bld 09/03/2017 176* 70 - 99 mg/dL Final   Please note change in reference range.  . BUN  09/03/2017 27* 8 - 23 mg/dL Final   Please note change in reference range.  . Creatinine, Ser 09/03/2017 0.94  0.44 - 1.00 mg/dL Final  . Calcium 09/03/2017 9.5  8.9 - 10.3 mg/dL Final  . Total Protein 09/03/2017 7.1  6.5 - 8.1 g/dL Final  . Albumin 09/03/2017 4.0  3.5 - 5.0 g/dL Final  . AST 09/03/2017 28  15 - 41 U/L Final  . ALT 09/03/2017 21  0 - 44 U/L  Final   Please note change in reference range.  . Alkaline Phosphatase 09/03/2017 88  38 - 126 U/L Final  . Total Bilirubin 09/03/2017 0.6  0.3 - 1.2 mg/dL Final  . GFR calc non Af Amer 09/03/2017 >60  >60 mL/min Final  . GFR calc Af Amer 09/03/2017 >60  >60 mL/min Final   Comment: (NOTE) The eGFR has been calculated using the CKD EPI equation. This calculation has not been validated in all clinical situations. eGFR's persistently <60 mL/min signify possible Chronic Kidney Disease.   Georgiann Hahn gap 09/03/2017 10  5 - 15 Final   Performed at [facility], [address] [place], [place] [address]  . WBC 09/03/2017 12.8* 3.6 - 11.0 K/uL Final  . RBC 09/03/2017 4.07  3.80 - 5.20 MIL/uL Final  . Hemoglobin 09/03/2017 13.0  12.0 - 16.0 g/dL Final  . HCT 09/03/2017 37.3  35.0 - 47.0 % Final  . MCV 09/03/2017 91.6  80.0 - 100.0 fL Final  . MCH 09/03/2017 31.9  26.0 - 34.0 pg Final  . MCHC 09/03/2017 34.9  32.0 - 36.0 g/dL Final  . RDW 09/03/2017 12.7  11.5 - 14.5 % Final  . Platelets 09/03/2017 227  150 - 440 K/uL Final  . Neutrophils Relative % 09/03/2017 90  % Final  . Neutro Abs 09/03/2017 11.5* 1.4 - 6.5 K/uL Final  . Lymphocytes Relative 09/03/2017 8  % Final  . Lymphs Abs 09/03/2017 1.1  1.0 - 3.6 K/uL Final  . Monocytes Relative 09/03/2017 2  % Final  . Monocytes Absolute 09/03/2017 0.2  0.2 - 0.9 K/uL Final  . Eosinophils Relative 09/03/2017 0  % Final  . Eosinophils Absolute 09/03/2017 0.0  0 - 0.7 K/uL Final  . Basophils Relative 09/03/2017 0  % Final  . Basophils Absolute 09/03/2017 0.1  0 - 0.1 K/uL Final    Performed at [facility], [address] [place], [place] [address]  . Magnesium 09/03/2017 2.2  1.7 - 2.4 mg/dL Final   Performed at [facility], [address] [place], [place] [address]    Assessment:  AIZAH GEHLHAUSEN is a 67 y.o. female with clinical stage IA left breast cancer s/p biopsy on 08/04/2017.  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  Tumor was ER + (95%), PR+ (20%), and Her 2 neu - by FISH.  Ki67 was 80%.  CA27.29 was 17.3 on 08/06/2017.  MammaPrint was high risk luminal-type B with a 10 year risk of recurrence untreated of 29% and 94.6% of distant metastasis free interval with chemotherapy + hormonal therapy.  Left breast ultrasound on 08/04/2017 revealed a 1.65 x 1.69 x 1.86 cm multi-lobulated hypoechoic mass in the upper outer quadrant of the left breast in the 2 o'clock position 7 cm from the nipple.  Bilateral breast MRI on 08/20/2017 revealed a 2.4 cm enhancing irregular mass in the outer left breast.  There were several prominent lymph nodes with borderline thicked cortices between 3-4 mm, but normal morphology.  There was a 5 mm enhancing mass in the anterior, retroareolar region of the right breast  MRI guided RIGHT breast biopsy done on 09/02/2017.  Pathology is pending.  Chest CT on 09/23/2016 revealed a 4 mm RUL subpleural nodule.  Chest CT on 08/17/2017 revealed a small 4 mm RUL nodule unchanged in size.  She received neoadjuvant Femara (08/12/2017 - 08/31/2017) secondary to planned a delay in surgery (mastectomy with immediate reconstruction).  Bone density  on 08/17/2017 revealed osteopenia with a T-score of -1.5 in the left femoral neck and -0.7 in the AP spine L1-L4.  She has a significant family history of breast cancer (paternal grandmother, sister, and 3 maternal cousins).   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  Symptomatically, she is doing well. She has no acute complaints today. Cough and hoarse voice quality since 03/2016. Exam reveals bruising to RIGHT breast s/p biopsy.  Labs unremarkable.    Plan: 1. Labs today: CBC with differential, CMP, magnesium. 2. Discuss MRI guided biopsy of RIGHT breast lesion done on 09/02/2017 - pathology is pending.  3. Discuss change in plans from hormonal therapy to neoadjuvant chemotherapy.  Discuss Mammaprint results.  Discuss postponing surgery until recovery from chemotherapy.  Surgery has been cancelled through Dr. Dwyane Luo office by patient.  4. Review proposed treatment regimen. Plans are for neoadjuvant TC chemotherapy with Neulasta support every 3 weeks x 4 cycles. Side effects reviewed in detail. Patient has been provided with written information for review. She has attended chemotherapy education class. Multiple questions fielded in clinic today. Patient expresses verbal consent to proceeding with chemotherapy treatment today as planned.  5. Labs reviewed. Blood counts stable and adequate enough for treatment. Will proceed with cycle #1 TC with Udencya support tomorrow.  6. Discuss symptom management.  Patient has antiemetics (ondansetron) at home to use on a PRN basis. Discussed lorazepam use. RX sent for lorazepam 0.5 mg q6h PRN (Disp #30). 7. Discuss nutrition. Patient with concerns related to nutritional status while on treatment. She wishes to discuss mitigating strategies to avoid nausea, and ultimately, weight loss. Patient to meet with cancer center RD today during her infusion.  8. Discuss growth factor associated bone pain. Encouraged patient to take daily loratadine.  9. Discuss limited handicap placard. Will complete documentation today.  10. RTC on 09/14/2017 for MD assessment and labs (CBC with diff, BMP). 11. RTC on 09/24/2017 for MD assessment, labs (CBC with diff, CMP, Mg) and cycle #2 TC chemotherapy with Udencya support the  following day.     Honor Loh, NP  09/03/2017, 9:32 AM   I saw and evaluated the patient, participating in the key portions of the service and reviewing pertinent diagnostic studies and records.  I reviewed the nurse practitioner's note and agree with the findings and the plan.  The assessment and plan were discussed with the patient.  Multiple questions were asked by the patient and answered.   Nolon Stalls, MD 09/03/2017,9:32 AM

## 2017-09-03 ENCOUNTER — Inpatient Hospital Stay (HOSPITAL_BASED_OUTPATIENT_CLINIC_OR_DEPARTMENT_OTHER): Payer: Medicare Other | Admitting: Hematology and Oncology

## 2017-09-03 ENCOUNTER — Inpatient Hospital Stay: Payer: Medicare Other

## 2017-09-03 ENCOUNTER — Ambulatory Visit: Payer: Medicare Other

## 2017-09-03 ENCOUNTER — Encounter: Payer: Self-pay | Admitting: Hematology and Oncology

## 2017-09-03 VITALS — BP 116/69 | HR 75 | Temp 97.4°F | Resp 18 | Ht 65.6 in | Wt 164.0 lb

## 2017-09-03 DIAGNOSIS — C50412 Malignant neoplasm of upper-outer quadrant of left female breast: Secondary | ICD-10-CM | POA: Diagnosis not present

## 2017-09-03 DIAGNOSIS — N6321 Unspecified lump in the left breast, upper outer quadrant: Secondary | ICD-10-CM

## 2017-09-03 DIAGNOSIS — Z008 Encounter for other general examination: Secondary | ICD-10-CM

## 2017-09-03 DIAGNOSIS — Z170 Estrogen receptor positive status [ER+]: Secondary | ICD-10-CM

## 2017-09-03 DIAGNOSIS — M8580 Other specified disorders of bone density and structure, unspecified site: Secondary | ICD-10-CM

## 2017-09-03 DIAGNOSIS — C50912 Malignant neoplasm of unspecified site of left female breast: Secondary | ICD-10-CM

## 2017-09-03 DIAGNOSIS — Z5111 Encounter for antineoplastic chemotherapy: Secondary | ICD-10-CM | POA: Diagnosis not present

## 2017-09-03 LAB — COMPREHENSIVE METABOLIC PANEL
ALT: 21 U/L (ref 0–44)
AST: 28 U/L (ref 15–41)
Albumin: 4 g/dL (ref 3.5–5.0)
Alkaline Phosphatase: 88 U/L (ref 38–126)
Anion gap: 10 (ref 5–15)
BUN: 27 mg/dL — ABNORMAL HIGH (ref 8–23)
CO2: 23 mmol/L (ref 22–32)
Calcium: 9.5 mg/dL (ref 8.9–10.3)
Chloride: 103 mmol/L (ref 98–111)
Creatinine, Ser: 0.94 mg/dL (ref 0.44–1.00)
GFR calc Af Amer: >60 mL/min (ref >60)
GFR calc non Af Amer: >60 mL/min (ref >60)
Glucose, Bld: 176 mg/dL — ABNORMAL HIGH (ref 70–99)
Potassium: 4 mmol/L (ref 3.5–5.1)
Sodium: 136 mmol/L (ref 135–145)
Total Bilirubin: 0.6 mg/dL (ref 0.3–1.2)
Total Protein: 7.1 g/dL (ref 6.5–8.1)

## 2017-09-03 LAB — CBC WITH DIFFERENTIAL/PLATELET
Basophils Absolute: 0.1 10*3/uL (ref 0–0.1)
Basophils Relative: 0 %
Eosinophils Absolute: 0 10*3/uL (ref 0–0.7)
Eosinophils Relative: 0 %
HCT: 37.3 % (ref 35.0–47.0)
Hemoglobin: 13 g/dL (ref 12.0–16.0)
Lymphocytes Relative: 8 %
Lymphs Abs: 1.1 10*3/uL (ref 1.0–3.6)
MCH: 31.9 pg (ref 26.0–34.0)
MCHC: 34.9 g/dL (ref 32.0–36.0)
MCV: 91.6 fL (ref 80.0–100.0)
Monocytes Absolute: 0.2 10*3/uL (ref 0.2–0.9)
Monocytes Relative: 2 %
Neutro Abs: 11.5 10*3/uL — ABNORMAL HIGH (ref 1.4–6.5)
Neutrophils Relative %: 90 %
Platelets: 227 10*3/uL (ref 150–440)
RBC: 4.07 MIL/uL (ref 3.80–5.20)
RDW: 12.7 % (ref 11.5–14.5)
WBC: 12.8 10*3/uL — ABNORMAL HIGH (ref 3.6–11.0)

## 2017-09-03 LAB — MAGNESIUM: Magnesium: 2.2 mg/dL (ref 1.7–2.4)

## 2017-09-03 MED ORDER — DOCETAXEL CHEMO INJECTION 160 MG/16ML
75.0000 mg/m2 | Freq: Once | INTRAVENOUS | Status: AC
  Administered 2017-09-03: 140 mg via INTRAVENOUS
  Filled 2017-09-03: qty 14

## 2017-09-03 MED ORDER — LORAZEPAM 0.5 MG PO TABS: 0.5000 mg | ORAL_TABLET | Freq: Four times a day (QID) | ORAL | 0 refills | Status: DC | PRN

## 2017-09-03 MED ORDER — SODIUM CHLORIDE 0.9 % IV SOLN: 10.0000 mg | Freq: Once | INTRAVENOUS | Status: DC

## 2017-09-03 MED ORDER — DEXAMETHASONE SODIUM PHOSPHATE 10 MG/ML IJ SOLN
10.0000 mg | Freq: Once | INTRAMUSCULAR | Status: AC
  Administered 2017-09-03: 10 mg via INTRAVENOUS
  Filled 2017-09-03: qty 1

## 2017-09-03 MED ORDER — SODIUM CHLORIDE 0.9 % IV SOLN
Freq: Once | INTRAVENOUS | Status: AC
  Administered 2017-09-03: 11:00:00 via INTRAVENOUS
  Filled 2017-09-03: qty 1000

## 2017-09-03 MED ORDER — SODIUM CHLORIDE 0.9 % IV SOLN
600.0000 mg/m2 | Freq: Once | INTRAVENOUS | Status: AC
  Administered 2017-09-03: 1120 mg via INTRAVENOUS
  Filled 2017-09-03: qty 50

## 2017-09-03 MED ORDER — PALONOSETRON HCL INJECTION 0.25 MG/5ML
0.2500 mg | Freq: Once | INTRAVENOUS | Status: AC
  Administered 2017-09-03: 0.25 mg via INTRAVENOUS
  Filled 2017-09-03: qty 5

## 2017-09-03 NOTE — Progress Notes (Signed)
Patient c/o coughing and sneezing x 1 day

## 2017-09-03 NOTE — Progress Notes (Signed)
Nutrition Assessment   Reason for Assessment:   Patient wanting to speak with RD  ASSESSMENT:  67 year old female with new diagnosis of invasive ductal breast cancer.  Noted high risk mammaprint so planning neoadjuvant chemotherapy followed by mastectomy on 7/12.  Past medical history of GERD, HTN, HLD  Met with patient during first infusion this am.  Patient reports appetite good, has had some nausea while taking femara but this has been stopped.  Patient with questions regarding diet.  Nutrition Focused Physical Exam: deferred  Medications: MVI, Vit b 12, Vit C, zofran  Labs: glucose 176, BUN 27, creatinine WDL  Anthropometrics:   Height: 65 inches Weight: 164 lb today Noted weight on 167 on 6/5 BMI: 26   Estimated Energy Needs  Kcals: 1850-2200 calories/d Protein: 92-110 g/d Fluid: 2.2 L/d  NUTRITION DIAGNOSIS: Food and nutrition related knowledge deficit as evidenced by new diagnosis of breast cancer and proceeding with chemotherapy.    MALNUTRITION DIAGNOSIS: none   INTERVENTION:   Discussed good sources of protein, handout provided Encouraged well balanced diet during treatment Provided patient with information regarding nausea and vomiting if have those symptoms.  Handout provided.  Encouraged patient to use medication to control nausea.      MONITORING, EVALUATION, GOAL: weight trends, intake   NEXT VISIT: July 18 during infusion  Ennis Heavner B. Zenia Resides, [place], [place] Registered Dietitian [phone] (pager)

## 2017-09-04 ENCOUNTER — Inpatient Hospital Stay: Payer: Medicare Other

## 2017-09-04 ENCOUNTER — Telehealth: Payer: Self-pay

## 2017-09-04 DIAGNOSIS — Z5111 Encounter for antineoplastic chemotherapy: Secondary | ICD-10-CM | POA: Diagnosis not present

## 2017-09-04 DIAGNOSIS — C50912 Malignant neoplasm of unspecified site of left female breast: Secondary | ICD-10-CM

## 2017-09-04 MED ORDER — PEGFILGRASTIM-CBQV 6 MG/0.6ML ~~LOC~~ SOSY
6.0000 mg | PREFILLED_SYRINGE | Freq: Once | SUBCUTANEOUS | Status: AC
  Administered 2017-09-04: 6 mg via SUBCUTANEOUS
  Filled 2017-09-04: qty 0.6

## 2017-09-04 NOTE — Telephone Encounter (Signed)
Patient called and states that she will be canceling her surgery scheduled for 09/18/17 at [facility]. She will be having chemotherapy done instead prior to surgery. She will look at rescheduling surgery once chemotherapy is complete. I have notified Dr Marla Roe, the [facility], and Nuclear Medicine.

## 2017-09-07 ENCOUNTER — Inpatient Hospital Stay: Payer: Medicare Other | Attending: Oncology | Admitting: Oncology

## 2017-09-07 ENCOUNTER — Encounter: Payer: Self-pay | Admitting: Urgent Care

## 2017-09-07 ENCOUNTER — Telehealth: Payer: Self-pay | Admitting: *Deleted

## 2017-09-07 VITALS — BP 123/76 | HR 78 | Temp 97.8°F | Resp 16

## 2017-09-07 DIAGNOSIS — R531 Weakness: Secondary | ICD-10-CM | POA: Insufficient documentation

## 2017-09-07 DIAGNOSIS — N179 Acute kidney failure, unspecified: Secondary | ICD-10-CM | POA: Insufficient documentation

## 2017-09-07 DIAGNOSIS — M8580 Other specified disorders of bone density and structure, unspecified site: Secondary | ICD-10-CM | POA: Insufficient documentation

## 2017-09-07 DIAGNOSIS — M549 Dorsalgia, unspecified: Secondary | ICD-10-CM | POA: Insufficient documentation

## 2017-09-07 DIAGNOSIS — C50912 Malignant neoplasm of unspecified site of left female breast: Secondary | ICD-10-CM

## 2017-09-07 DIAGNOSIS — C50412 Malignant neoplasm of upper-outer quadrant of left female breast: Secondary | ICD-10-CM | POA: Diagnosis not present

## 2017-09-07 DIAGNOSIS — Z5111 Encounter for antineoplastic chemotherapy: Secondary | ICD-10-CM | POA: Insufficient documentation

## 2017-09-07 DIAGNOSIS — Z170 Estrogen receptor positive status [ER+]: Secondary | ICD-10-CM | POA: Diagnosis not present

## 2017-09-07 DIAGNOSIS — B370 Candidal stomatitis: Secondary | ICD-10-CM | POA: Insufficient documentation

## 2017-09-07 DIAGNOSIS — B9620 Unspecified Escherichia coli [E. coli] as the cause of diseases classified elsewhere: Secondary | ICD-10-CM | POA: Insufficient documentation

## 2017-09-07 DIAGNOSIS — R05 Cough: Secondary | ICD-10-CM | POA: Insufficient documentation

## 2017-09-07 DIAGNOSIS — R809 Proteinuria, unspecified: Secondary | ICD-10-CM | POA: Insufficient documentation

## 2017-09-07 DIAGNOSIS — R509 Fever, unspecified: Secondary | ICD-10-CM | POA: Insufficient documentation

## 2017-09-07 DIAGNOSIS — R197 Diarrhea, unspecified: Secondary | ICD-10-CM | POA: Insufficient documentation

## 2017-09-07 DIAGNOSIS — R5383 Other fatigue: Secondary | ICD-10-CM | POA: Insufficient documentation

## 2017-09-07 MED ORDER — FLUCONAZOLE 200 MG PO TABS: 200.0000 mg | ORAL_TABLET | Freq: Every day | ORAL | 0 refills | Status: DC

## 2017-09-07 NOTE — Telephone Encounter (Signed)
Patient called and reports that she had her first chemotherapy  Treatment Thursday and that her tongue is now swollen and coated white. She reports that she keeps biting the inside of her jaw and her tongue too. Her speech was clear on her message. Please advise

## 2017-09-07 NOTE — Telephone Encounter (Signed)
Patient accepts appointment for 2pm

## 2017-09-07 NOTE — Telephone Encounter (Signed)
Per Dr Mike Gip, patient to see [facility] NP

## 2017-09-08 NOTE — Progress Notes (Signed)
Symptom Management Consult note [facility]  [phone] [phone]  Patient Care Team: Marinda Elk, MD as PCP - General (Physician Assistant) Bary Castilla Forest Gleason, MD (General Surgery) Rocco Serene, MD (Internal Medicine)   Name of the patient: Mariah Hogan  [id number]  [birth year]   Date of visit: 09/08/17  Diagnosis-stage Ia left breast cancer  Chief complaint/ Reason for visit- sore mouth  Heme/Onc history: Patient was last seen by primary medical oncologist Dr. Mike Gip on 09/03/2017 prior to cycle 1 of TC chemotherapy with Neulasta support.  She denied any acute complaints at that visit.  Exam revealed bruising to right breast status post biopsy.  Her labs were unremarkable.  Oncology History   Mariah Hogan is a 67 y.o. female with clinical stage IA left breast cancer s/p biopsy on 08/04/2017.  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  Tumor was ER + (95%), PR+ (20%), and Her 2 neu - by FISH.  Ki67 was 80%.  CA27.29 was 17.3 on 08/06/2017.  MammaPrint was high risk luminal-type B with a 10 year risk of recurrence untreated of 29% and 94.6% of distant metastasis free interval with chemotherapy + hormonal therapy.  Left breast ultrasound on 08/04/2017 revealed a 1.65 x 1.69 x 1.86 cm multi-lobulated hypoechoic mass in the upper outer quadrant of the left breast in the 2 o'clock position 7 cm from the nipple.  Bilateral breast MRI on 08/20/2017 revealed a 2.4 cm enhancing irregular mass in the outer left breast.  There were several prominent lymph nodes with borderline thicked cortices between 3-4 mm, but normal morphology.  There was a 5 mm enhancing mass in the anterior, retroareolar region of the right breast  MRI guided RIGHT breast biopsy done on 09/02/2017.  Pathology is pending.  Chest CT on 09/23/2016 revealed a 4 mm RUL subpleural nodule.  Chest CT on 08/17/2017 revealed a small  4 mm RUL nodule unchanged in size.  She received neoadjuvant Femara (08/12/2017 - 08/31/2017) secondary to planned a delay in surgery (mastectomy with immediate reconstruction).  Bone density on 08/17/2017 revealed osteopenia with a T-score of -1.5 in the left femoral neck and -0.7 in the AP spine L1-L4.  She has a significant family history of breast cancer (paternal grandmother, sister, and 3 maternal cousins).   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        Invasive ductal carcinoma of left breast ([place])   08/05/2017 Initial Diagnosis    Invasive ductal carcinoma of left breast ([place])      08/30/2017 -  Chemotherapy    The patient had palonosetron (ALOXI) injection 0.25 mg, 0.25 mg, Intravenous,  Once, 1 of 4 cycles Administration: 0.25 mg (09/03/2017) pegfilgrastim-cbqv (UDENYCA) injection 6 mg, 6 mg, Subcutaneous, Once, 1 of 4 cycles Administration: 6 mg (09/04/2017) cyclophosphamide (CYTOXAN) 1,120 mg in sodium chloride 0.9 % 250 mL chemo infusion, 600 mg/m2 = 1,120 mg, Intravenous,  Once, 1 of 4 cycles Administration: 1,120 mg (09/03/2017) DOCEtaxel (TAXOTERE) 140 mg in sodium chloride 0.9 % 250 mL chemo infusion, 75 mg/m2 = 140 mg, Intravenous,  Once, 1 of 4 cycles Administration: 140 mg (09/03/2017)  for chemotherapy treatment.       Interval history-  Patient here for evaluation of evaluation of white spots in mouth.  Onset of symptoms was 1 days ago, gradually worsening since that time.  Associated symptoms are mouth sores,  trouble swallowing and swollen tongue. She also notes change in appetite.  She has trouble swallowing solid foods. Has tried brushing and keeping her mouth very hydrated without resolution.  ECOG FS:1 - Symptomatic but completely ambulatory  Review of systems- Review of Systems  Constitutional: Negative.  Negative for chills, fever, malaise/fatigue and weight loss.    HENT: Positive for sore throat. Negative for congestion, ear discharge, ear pain, nosebleeds and tinnitus.   Eyes: Negative.  Negative for blurred vision and double vision.  Respiratory: Negative.  Negative for cough, sputum production and shortness of breath.   Cardiovascular: Negative.  Negative for chest pain, palpitations and leg swelling.  Gastrointestinal: Negative.  Negative for abdominal pain, constipation, diarrhea, nausea and vomiting.  Genitourinary: Negative for dysuria, frequency and urgency.  Musculoskeletal: Negative for back pain and falls.  Skin: Negative.  Negative for rash.  Neurological: Negative.  Negative for weakness and headaches.  Endo/Heme/Allergies: Negative.  Does not bruise/bleed easily.  Psychiatric/Behavioral: Negative.  Negative for depression. The patient is not nervous/anxious and does not have insomnia.      Current treatment- S/p Cycle 1 AC on 09/03/17  Allergies  Allergen Reactions  . Penicillin G Rash, Shortness Of Breath and Swelling  . Fluocinonide   . Other Other (See Comments)    Dodecyl gallate: Positive patch test  Elta MD UV Clear SPF 46: Positive patch test  fragrance dodocylgallate dodocylgallate  . Parabens Other (See Comments)    Positive patch test   . Penicillins   . Shellac   . Triamcinolone Acetonide   . Triamcinolone Rash     Past Medical History:  Diagnosis Date  . Arthritis   . Asthma   . Breast cancer ([place]) 08/04/2017   left breast INVASIVE DUCTAL CARCINOMA.  . Cancer ([place])    [phone] basal cell carcinoma  . GERD (gastroesophageal reflux disease)   . Hyperlipidemia   . Hypertension      Past Surgical History:  Procedure Laterality Date  . ABDOMINAL HYSTERECTOMY  1998  . bladder tack  [id number],3220  . BREAST BIOPSY Bilateral   . BREAST BIOPSY Left 1990  . BREAST BIOPSY Left 2018   core bx- neg  . 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  Marland Kitchen BREAST CYST EXCISION  x3   . BREAST SURGERY Left 02/13/14   Fibrocystic changes, pseudo-angiomatous stromal hyperplasia. No atypia or malignancy.  . CARPAL TUNNEL RELEASE  x2  . CHOLECYSTECTOMY  2008  . COLONOSCOPY WITH PROPOFOL N/A 01/12/2015   Procedure: COLONOSCOPY WITH PROPOFOL;  Surgeon: Manya Silvas, MD;  Location[facility] ENDOSCOPY;  Service: Endoscopy;  Laterality: N/A;  . ESOPHAGOGASTRODUODENOSCOPY (EGD) WITH PROPOFOL N/A 01/12/2015   Procedure: ESOPHAGOGASTRODUODENOSCOPY (EGD) WITH PROPOFOL;  Surgeon: Manya Silvas, MD;  Location[facility] ENDOSCOPY;  Service: Endoscopy;  Laterality: N/A;  . NASAL RECONSTRUCTION  1983  . SAVORY DILATION N/A 01/12/2015   Procedure: SAVORY DILATION;  Surgeon: Manya Silvas, MD;  Location[facility] ENDOSCOPY;  Service: Endoscopy;  Laterality: N/A;  . TONSILLECTOMY AND ADENOIDECTOMY  1956    Social History   Socioeconomic History  . Marital status: Single    Spouse name: Not on file  . Number of children: Not on file  . Years of education: Not on file  . Highest education level: Not on file  Occupational History  . Not on file  Social Needs  . Financial resource strain: Not on file  . Food insecurity:    Worry:  Not on file    Inability: Not on file  . Transportation needs:    Medical: Not on file    Non-medical: Not on file  Tobacco Use  . Smoking status: Never Smoker  . Smokeless tobacco: Never Used  Substance and Sexual Activity  . Alcohol use: No    Alcohol/week: 0.0 oz  . Drug use: No  . Sexual activity: Not on file  Lifestyle  . Physical activity:    Days per week: Not on file    Minutes per session: Not on file  . Stress: Not on file  Relationships  . Social connections:    Talks on phone: Not on file    Gets together: Not on file    Attends religious service: Not on file    Active member of club or organization: Not on file    Attends meetings of clubs or organizations: Not on file    Relationship status: Not on file  . Intimate partner violence:     Fear of current or ex partner: Not on file    Emotionally abused: Not on file    Physically abused: Not on file    Forced sexual activity: Not on file  Other Topics Concern  . Not on file  Social History Narrative  . Not on file    Family History  Problem Relation Age of Onset  . Breast cancer Sister 26       currently 30  . Breast cancer Paternal Grandmother 29       bilateral; deceased 64s   . Breast cancer Cousin 27       daughter of an unaffected maternal aunt  . Breast cancer Cousin 10       kidney cancer also; daughter of an unaffected maternal aunt  . Other Mother        TAH/BSO prior to menopause  . Prostate cancer Paternal Grandfather        age at dx unknown  . Breast cancer Maternal Aunt        age at dx unknown  . Breast cancer Cousin 80       daughter of an unaffected maternal aunt     Current Outpatient Medications:  .  atenolol (TENORMIN) 50 MG tablet, Take 50 mg by mouth daily., Disp: , Rfl:  .  Cholecalciferol 1000 UNITS tablet, Take 1,000 Units by mouth daily., Disp: , Rfl:  .  citalopram (CELEXA) 20 MG tablet, Take 20 mg by mouth daily., Disp: , Rfl:  .  dexamethasone (DECADRON) 4 MG tablet, Take 2 tablets (8 mg total) by mouth 2 (two) times daily. Start the day before Taxotere. Then again the day after chemo for 3 days., Disp: 30 tablet, Rfl: 1 .  loratadine (CLARITIN) 10 MG tablet, Take 10 mg by mouth daily., Disp: , Rfl:  .  LORazepam (ATIVAN) 0.5 MG tablet, Take 1 tablet (0.5 mg total) by mouth every 6 (six) hours as needed (Nausea or vomiting)., Disp: 30 tablet, Rfl: 0 .  meloxicam (MOBIC) 15 MG tablet, Take 15 mg by mouth as needed. , Disp: , Rfl:  .  ondansetron (ZOFRAN) 8 MG tablet, Take 1 tablet (8 mg total) by mouth 2 (two) times daily as needed for refractory nausea / vomiting. Start on day 3 after chemo., Disp: 30 tablet, Rfl: 1 .  pantoprazole (PROTONIX) 40 MG tablet, Take 40 mg by mouth daily., Disp: , Rfl:  .  ranitidine (ZANTAC) 300 MG  tablet, Take 1 tablet (  300 mg total) by mouth at bedtime., Disp: 30 tablet, Rfl: 5 .  triamterene-hydrochlorothiazide (MAXZIDE-25) 37.5-25 MG tablet, Take 1 tablet by mouth daily., Disp: , Rfl:  .  colestipol (COLESTID) 1 G tablet, Take 1 g by mouth as needed. , Disp: , Rfl:  .  fluconazole (DIFLUCAN) 200 MG tablet, Take 1 tablet (200 mg total) by mouth daily., Disp: 5 tablet, Rfl: 0 .  Multiple Vitamin (MULTIVITAMIN) tablet, Take 1 tablet by mouth daily., Disp: , Rfl:  .  vitamin B-12 (CYANOCOBALAMIN) 1000 MCG tablet, Take 1,000 mcg by mouth daily., Disp: , Rfl:  .  vitamin C (ASCORBIC ACID) 500 MG tablet, Take 500 mg by mouth daily., Disp: , Rfl:   Physical exam:  Vitals:   09/07/17 1434  BP: 123/76  Pulse: 78  Resp: 16  Temp: 97.8 F (36.6 C)  TempSrc: Tympanic   Physical Exam  Constitutional: She is oriented to person, place, and time. Vital signs are normal. She appears well-developed and well-nourished.  HENT:  Head: Normocephalic and atraumatic.  Mouth/Throat: Mucous membranes are pale.  Thrush present on tongue and inside cheeks.  Small oral lesion present.   Eyes: Pupils are equal, round, and reactive to light.  Neck: Normal range of motion.  Cardiovascular: Normal rate, regular rhythm and normal heart sounds.  No murmur heard. Pulmonary/Chest: Effort normal and breath sounds normal. She has no wheezes.  Abdominal: Soft. Normal appearance and bowel sounds are normal. She exhibits no distension. There is no tenderness.  Musculoskeletal: Normal range of motion. She exhibits no edema.  Neurological: She is alert and oriented to person, place, and time.  Skin: Skin is warm and dry. No rash noted.  Psychiatric: Judgment normal.     CMP Latest Ref Rng & Units 09/03/2017  Glucose 70 - 99 mg/dL 176(H)  BUN 8 - 23 mg/dL 27(H)  Creatinine 0.44 - 1.00 mg/dL 0.94  Sodium 135 - 145 mmol/L 136  Potassium 3.5 - 5.1 mmol/L 4.0  Chloride 98 - 111 mmol/L 103  CO2 22 - 32 mmol/L 23    Calcium 8.9 - 10.3 mg/dL 9.5  Total Protein 6.5 - 8.1 g/dL 7.1  Total Bilirubin 0.3 - 1.2 mg/dL 0.6  Alkaline Phos 38 - 126 U/L 88  AST 15 - 41 U/L 28  ALT 0 - 44 U/L 21   CBC Latest Ref Rng & Units 09/03/2017  WBC 3.6 - 11.0 K/uL 12.8(H)  Hemoglobin 12.0 - 16.0 g/dL 13.0  Hematocrit 35.0 - 47.0 % 37.3  Platelets 150 - 440 K/uL 227    No images are attached to the encounter.  Ct Chest Wo Contrast  Result Date: 08/17/2017 CLINICAL DATA:  Followup lung nodule. EXAM: CT CHEST WITHOUT CONTRAST TECHNIQUE: Multidetector CT imaging of the chest was performed following the standard protocol without IV contrast. COMPARISON:  09/23/2016 FINDINGS: Cardiovascular: The heart size appears normal. Small pericardial effusion versus thickening is similar to previous exam. Aortic atherosclerosis. Mediastinum/Nodes: Normal appearance of the thyroid gland. The trachea appears patent and is midline. Normal appearance of the esophagus. No supraclavicular or axillary adenopathy. Lungs/Pleura: No pleural effusion. No airspace consolidation, atelectasis or pneumothorax. Stable 4 mm nodule within the periphery of the right upper lobe, image 38/2. None no additional pulmonary nodules identified. Upper Abdomen: Mild hepatic steatosis. Previous cholecystectomy. No acute abnormality noted. Musculoskeletal: Left breast lesion containing biopsy clip measures 1.9 cm, image 51/6. 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 No aggressive lytic or sclerotic bone lesions. IMPRESSION: 1.  Small 4 mm right upper lobe lung nodule is unchanged when compared with the previous exam. 2. Left breast lesion containing biopsy clip noted 3.  Aortic Atherosclerosis (ICD10-I70.0). Electronically Signed   By: Kerby Moors M.D.   On: 08/17/2017 13:57   Mr Breast Bilateral W Wo Contrast Inc Cad  Result Date: 08/20/2017 CLINICAL DATA:  Breast MRI for extent of disease. Recent dx invasive ductal carcinoma left breast 3 weeks  ago with bx. Patient has had multiple surgical biopsies on both breasts (left Nov 1990, Dec 2015, Mar 2018 and right Mar 2008 patient states all were negative for cancer.) Family hx paternal grandmother not sure of age and sister age 80. Started taking a chemo pill 1 week ago. Patient presented to the [facility] on 04/10/2017 with reported spontaneous right nipple discharge. An etiology for the discharge was not found at diagnostic imaging. Breast MRI was suggested, but if not performed, short-term right breast diagnostic mammography and ultrasound was recommended. Patient underwent ultrasound-guided core needle biopsy of a left breast mass at the surgical office. Revealing invasive ductal carcinoma and DCIS. LABS:  Labs not drawn at time of imaging. EXAM: BILATERAL BREAST MRI WITH AND WITHOUT CONTRAST TECHNIQUE: Multiplanar, multisequence MR images of both breasts were obtained prior to and following the intravenous administration of 13 ml of MultiHance. THREE-DIMENSIONAL MR IMAGE RENDERING ON INDEPENDENT WORKSTATION: Three-dimensional MR images were rendered by post-processing of the original MR data on an independent workstation. The three-dimensional MR images were interpreted, and findings are reported in the following complete MRI report for this study. Three dimensional images were evaluated at the independent DynaCad workstation COMPARISON:  Previous exam(s). FINDINGS: Breast composition: c. Heterogeneous fibroglandular tissue. Background parenchymal enhancement: Moderate. Right breast: There is a 5 mm enhancing mass in the anterior, retroareolar right breast with slow to moderate wash-in and plateau washout kinetics. Enhancement is homogeneous. No other right breast masses or areas of abnormal enhancement. Left breast: There is an enhancing irregular mass in the upper outer left breast measuring 2.4 x 2.1 x 2.2 cm. Mass contains artifact from biopsy clip. This  represents the recently diagnosed breast carcinoma. There are no other left breast masses or areas of abnormal enhancement. Lymph nodes: There prominent left axillary lymph nodes with cortical thickness is between 3 and 4 mm. Nodes maintain normal overall morphology with central hilar fat. No prominent right axillary lymph nodes. Ancillary findings:  None. IMPRESSION: 1. 2.4 cm enhancing irregular mass in the outer left breast reflects the recently diagnosed breast carcinoma. 2. Several prominent left axillary lymph nodes with borderline thickened cortices between 3 and 4 mm, but normal morphology. Recommend follow-up left axillary ultrasound for further assessment. 3. 5 mm enhancing mass the anterior, retroareolar region of the right breast. Given the symptoms of clear right nipple discharge current history of left breast carcinoma, MRI guided core needle biopsy is recommended. RECOMMENDATION: 1. Left axillary ultrasound, if this has not already been performed, to assess the sonographic appearance of the prominent lymph nodes noted on MRI. 2. MRI guided biopsy of the small, 5 mm, enhancing retroareolar right breast mass. BI-RADS CATEGORY  4: Suspicious. Electronically Signed   By: Lajean Manes M.D.   On: 08/20/2017 12:50   Dg Bone Density  Result Date: 08/17/2017 EXAM: DUAL X-RAY ABSORPTIOMETRY (DXA) FOR BONE MINERAL DENSITY IMPRESSION: Dear Dr Honor Loh, Your patient Evelena Masci completed a BMD test on 08/17/2017 using the [place] (analysis version: 14.10)  manufactured by [employer]. The following summarizes the results of our evaluation. PATIENT BIOGRAPHICAL: Name: Celestial, [place] Patient ID: [id number] Birth Date: 1950/07/10 Height: 66.0 in. Gender: Female Exam Date: 08/17/2017 Weight: 166.0 lbs. Indications: Advanced Age, arthritis, asthma, Bilateral Ovariectomy, Breast ca, Caucasian, high risk meds, Hysterectomy, POSTmenopausal, skin ca Fractures: Treatments: letrozole,  multivitamin, Q-VAR ASSESSMENT: The BMD measured at Femur Neck Left is 0.833 g/cm2 with a T-score of -1.5. This patient is considered osteopenic according to [facility]) criteria. Site Region Measured Measured WHO Young Adult BMD Date       Age      Classification T-score AP Spine L1-L4 08/17/2017 69.1 Normal -0.7 1.098 g/cm2 DualFemur Neck Left 08/17/2017 69.1 Osteopenia -1.5 0.833 g/cm2 World Health Organization [facility]) criteria for post-menopausal, Caucasian Women: Normal:       T-score at or above -1 SD Osteopenia:   T-score between -1 and -2.5 SD Osteoporosis: T-score at or below -2.5 SD RECOMMENDATIONS: 1. All patients should optimize calcium and vitamin D intake. 2. Consider FDA-approved medical therapies in postmenopausal women and men aged 16 years and older, based on the following: a. A hip or vertebral (clinical or morphometric) fracture b. T-score < -2.5 at the femoral neck or spine after appropriate evaluation to exclude secondary causes c. Low bone mass (T-score between -1.0 and -2.5 at the femoral neck or spine) and a 10-year probability of a hip fracture > 3% or a 10-year probability of a major osteoporosis-related fracture > 20% based on the [place]-adapted WHO algorithm d. Clinician judgment and/or patient preferences may indicate treatment for people with 10-year fracture probabilities above or below these levels FOLLOW-UP: Patients with diagnosed cases of osteoporosis or at high risk for fracture should have regular bone mineral density tests. For patients eligible for Medicare, routine testing is allowed once every 2 years. The testing frequency can be increased to one year for patients who have rapidly progressing disease, those who are receiving or discontinuing medical therapy to restore bone mass, or have additional risk factors. I have reviewed this report, and agree with the above findings. [facility] Radiology Dear Dr Honor Loh, Your patient MARYON KEMNITZ completed a FRAX  assessment on 08/17/2017 using the [place] (analysis version: 14.10) manufactured by [employer]. The following summarizes the results of our evaluation. PATIENT BIOGRAPHICAL: Name: Holleigh, [place] Patient ID: [id number] Birth Date: 07-Sep-1950 Height:    66.0 in. Gender:     [age]    [age]       Weight:    166.0 lbs. Ethnicity:  White                            Exam Date: 08/17/2017 FRAX* RESULTS:  (version: 3.5) 10-year Probability of Fracture1 Major Osteoporotic Fracture2 Hip Fracture 9.8% 1.3% Population: [place] (Caucasian) Risk Factors: None Based on Femur (Left) Neck BMD 1 -The 10-year probability of fracture may be lower than reported if the patient has received treatment. 2 -Major Osteoporotic Fracture: Clinical Spine, Forearm, Hip or Shoulder *FRAX is a Materials engineer of the [employer] for Metabolic Bone Disease, a [place] (WHO) [facility]. ASSESSMENT: The probability of a major osteoporotic fracture is 9.8% within the next ten years. The probability of a hip fracture is 1.3% within the next ten years. Electronically Signed   By: Dorise Bullion III M.D   On:  08/17/2017 16:34   US Breast Complete Uni Right Inc Axilla  Result Date: 08/27/2017 Second look ultrasound was planned to determine if an MRI biopsy could be avoided.  The retroareolar area shows several cysts measuring up to 7 mm.  There is a deep-seated hypoechoic/anechoic lesion which would not correspond with an enhancing nodule on imaging.  No clear-cut correlate to the mammogram or ultrasound findings.  No images, no charge.   Mm Clip Placement Right  Result Date: 09/02/2017 CLINICAL DATA:  Evaluate biopsy marker placement EXAM: DIAGNOSTIC RIGHT MAMMOGRAM POST MRI BIOPSY COMPARISON:  Previous exam(s). FINDINGS: Mammographic images were obtained following MRI guided biopsy of a retroareolar right breast mass. The dumbbell shaped clip migrated 2  cm medial to the biopsied mass. IMPRESSION: The dumbbell shaped biopsy clip migrated 2 cm medial to the biopsied mass. Final Assessment: Post Procedure Mammograms for Marker Placement Electronically Signed   By: Dorise Bullion III M.D   On: 09/02/2017 09:41   Mr Rt Breast Bx Johnella Moloney Dev 1st Lesion Image Bx Spec Mr Guide  Addendum Date: 09/03/2017   ADDENDUM REPORT: 09/03/2017 11:03 ADDENDUM: Pathology revealed FIBROCYSTIC CHANGE AND USUAL DUCTAL HYPERPLASIA, DILATED DUCTS, definitive papilloma is not seen of RIGHT breast, retroareolar. This was found to be concordant by Dr. Hassan Rowan. Pathology results were discussed with the patient by telephone. The patient reported doing well after the biopsy with tenderness at the site. Post biopsy instructions and care were reviewed and questions were answered. The patient was encouraged to call [facility] for any additional concerns. The patient has a recent diagnosis of LEFT breast cancer and should follow her outlined treatment plan. Pathology results reported by Roselind Messier, RN on 09/03/2017. Electronically Signed   By: Margarette [place] M.D.   On: 09/03/2017 11:03   Result Date: 09/03/2017 CLINICAL DATA:  67 year old female for tissue sampling of 5 mm RETROAREOLAR RIGHT breast mass. EXAM: MRI GUIDED CORE NEEDLE BIOPSY OF THE RIGHT BREAST TECHNIQUE: Multiplanar, multisequence MR imaging of the RIGHT breast was performed both before and after administration of intravenous contrast. CONTRAST:  63m MULTIHANCE GADOBENATE DIMEGLUMINE 529 MG/ML IV SOLN COMPARISON:  Previous exams. FINDINGS: I met with the patient, and we discussed the procedure of MRI guided biopsy, including risks, benefits, and alternatives. Specifically, we discussed the risks of infection, bleeding, tissue injury, clip migration, and inadequate sampling. Informed, written consent was given. The usual time out protocol was performed immediately prior to the procedure. Using sterile  technique, 1% Lidocaine, MRI guidance, and a 9 gauge vacuum assisted device, biopsy was performed of the 5 mm enhancing mass in the RETROAREOLAR RIGHT breast using a LATERAL approach. At the conclusion of the procedure, a hourglass shaped tissue marker clip was deployed into the biopsy cavity. Follow-up 2-view mammogram was performed and dictated separately. IMPRESSION: MRI guided biopsy of RETROAREOLAR RIGHT breast mass. No apparent complications. Electronically Signed: By: JMargarette CanadaM.D. On: 09/02/2017 10:01     Assessment and plan- Patient is a 67y.o. female who presents for oral thrush.  1.  Stage Ib breast cancer: Status post cycle 1 of AC.  Last given on 09/04/2017.  Tolerated well.  Scheduled to return to clinic for evaluation of the labs on 09/14/17 and again on 09/24/2017 for cycle 2 of AC.  2.  Oral thrush: d/t chemo. Visible on tongue, inside cheeks and and back of throat.  Tongue appears swollen.  Will give 3 days of  RX Diflucan 200 mg  tablets and RX Magic mouthwash with lidocaine.  Will call patient in a couple days to see if this is improved.  Encourage patient to stay hydrated.  Also recommended Biotene to keep mouth hydrated and moisturized.   Visit Diagnosis 1. Invasive ductal carcinoma of left breast ([place])   2. Oral thrush     Patient expressed understanding and was in agreement with this plan. She also understands that She can call clinic at any time with any questions, concerns, or complaints.   Greater than 50% was spent in counseling and coordination of care with this patient including but not limited to discussion of the relevant topics above (See A&P) including, but not limited to diagnosis and management of acute and chronic medical conditions.    Faythe Casa, AGNP-C [facility] at [place]- [phone] Pager- [phone] 09/08/2017 11:21 AM

## 2017-09-09 ENCOUNTER — Other Ambulatory Visit: Payer: Self-pay | Admitting: Oncology

## 2017-09-09 ENCOUNTER — Encounter: Payer: Self-pay | Admitting: Urgent Care

## 2017-09-09 ENCOUNTER — Inpatient Hospital Stay: Admission: RE | Admit: 2017-09-09 | Payer: Medicare Other | Source: Ambulatory Visit

## 2017-09-09 ENCOUNTER — Telehealth: Payer: Self-pay | Admitting: *Deleted

## 2017-09-09 ENCOUNTER — Inpatient Hospital Stay (HOSPITAL_BASED_OUTPATIENT_CLINIC_OR_DEPARTMENT_OTHER): Payer: Medicare Other | Admitting: Hematology and Oncology

## 2017-09-09 ENCOUNTER — Other Ambulatory Visit: Payer: Medicare Other

## 2017-09-09 ENCOUNTER — Ambulatory Visit
Admission: RE | Admit: 2017-09-09 | Discharge: 2017-09-09 | Disposition: A | Discharge status: Home / Self Care | Payer: Medicare Other | Source: Ambulatory Visit | Attending: Urgent Care | Admitting: Urgent Care

## 2017-09-09 ENCOUNTER — Ambulatory Visit
Admission: RE | Admit: 2017-09-09 | Discharge: 2017-09-09 | Disposition: A | Discharge status: Home / Self Care | Payer: Medicare Other | Source: Ambulatory Visit | Attending: *Deleted | Admitting: *Deleted

## 2017-09-09 VITALS — BP 107/71 | HR 93 | Temp 98.0°F | Resp 18 | Wt 155.9 lb

## 2017-09-09 DIAGNOSIS — R05 Cough: Secondary | ICD-10-CM

## 2017-09-09 DIAGNOSIS — M8580 Other specified disorders of bone density and structure, unspecified site: Secondary | ICD-10-CM

## 2017-09-09 DIAGNOSIS — R509 Fever, unspecified: Secondary | ICD-10-CM

## 2017-09-09 DIAGNOSIS — C50412 Malignant neoplasm of upper-outer quadrant of left female breast: Secondary | ICD-10-CM

## 2017-09-09 DIAGNOSIS — R197 Diarrhea, unspecified: Secondary | ICD-10-CM

## 2017-09-09 DIAGNOSIS — C50912 Malignant neoplasm of unspecified site of left female breast: Secondary | ICD-10-CM

## 2017-09-09 DIAGNOSIS — Z170 Estrogen receptor positive status [ER+]: Secondary | ICD-10-CM

## 2017-09-09 DIAGNOSIS — R059 Cough, unspecified: Secondary | ICD-10-CM

## 2017-09-09 DIAGNOSIS — R531 Weakness: Secondary | ICD-10-CM

## 2017-09-09 DIAGNOSIS — K1230 Oral mucositis (ulcerative), unspecified: Secondary | ICD-10-CM

## 2017-09-09 DIAGNOSIS — B370 Candidal stomatitis: Secondary | ICD-10-CM

## 2017-09-09 LAB — URINALYSIS, COMPLETE (UACMP) WITH MICROSCOPIC
Glucose, UA: NEGATIVE mg/dL
Hgb urine dipstick: NEGATIVE
Leukocytes, UA: NEGATIVE
Nitrite: NEGATIVE
Protein, ur: 30 mg/dL — AB
Specific Gravity, Urine: 1.02 (ref 1.005–1.030)
pH: 5.5 (ref 5.0–8.0)

## 2017-09-09 LAB — CBC WITH DIFFERENTIAL/PLATELET
Basophils Absolute: 0 10*3/uL (ref 0–0.1)
Basophils Relative: 1 %
Eosinophils Absolute: 0 10*3/uL (ref 0–0.7)
Eosinophils Relative: 2 %
HCT: 44.1 % (ref 35.0–47.0)
Hemoglobin: 14.7 g/dL (ref 12.0–16.0)
Lymphocytes Relative: 47 %
Lymphs Abs: 0.7 10*3/uL — ABNORMAL LOW (ref 1.0–3.6)
MCH: 31.3 pg (ref 26.0–34.0)
MCHC: 33.2 g/dL (ref 32.0–36.0)
MCV: 94.1 fL (ref 80.0–100.0)
Monocytes Absolute: 0 10*3/uL — ABNORMAL LOW (ref 0.2–0.9)
Monocytes Relative: 1 %
Neutro Abs: 0.8 10*3/uL — ABNORMAL LOW (ref 1.4–6.5)
Neutrophils Relative %: 49 %
Platelets: 159 10*3/uL (ref 150–440)
RBC: 4.69 MIL/uL (ref 3.80–5.20)
RDW: 12.4 % (ref 11.5–14.5)
WBC: 1.6 10*3/uL — ABNORMAL LOW (ref 3.6–11.0)

## 2017-09-09 LAB — COMPREHENSIVE METABOLIC PANEL
ALT: 18 U/L (ref 0–44)
AST: 18 U/L (ref 15–41)
Albumin: 3.6 g/dL (ref 3.5–5.0)
Alkaline Phosphatase: 91 U/L (ref 38–126)
Anion gap: 11 (ref 5–15)
BUN: 36 mg/dL — ABNORMAL HIGH (ref 8–23)
CO2: 25 mmol/L (ref 22–32)
Calcium: 8.7 mg/dL — ABNORMAL LOW (ref 8.9–10.3)
Chloride: 97 mmol/L — ABNORMAL LOW (ref 98–111)
Creatinine, Ser: 1.35 mg/dL — ABNORMAL HIGH (ref 0.44–1.00)
GFR calc Af Amer: 45 mL/min — ABNORMAL LOW (ref >60)
GFR calc non Af Amer: 39 mL/min — ABNORMAL LOW (ref >60)
Glucose, Bld: 146 mg/dL — ABNORMAL HIGH (ref 70–99)
Potassium: 4.6 mmol/L (ref 3.5–5.1)
Sodium: 133 mmol/L — ABNORMAL LOW (ref 135–145)
Total Bilirubin: 1.2 mg/dL (ref 0.3–1.2)
Total Protein: 6.7 g/dL (ref 6.5–8.1)

## 2017-09-09 LAB — C DIFFICILE QUICK SCREEN W PCR REFLEX
C Diff antigen: NEGATIVE
C Diff interpretation: NOT DETECTED
C Diff toxin: NEGATIVE

## 2017-09-09 NOTE — Progress Notes (Signed)
Patient here today as acute add on.  States she is having diarrhea, back pain and spasms and thrush in her mouth.  Also has cough and post nasal drainage.

## 2017-09-09 NOTE — Telephone Encounter (Signed)
Patient called and states she is not feeling well at all. She has back pain and spasms, temp is climbing now at 100.1 was 99.6 earlier, very tired, sinus drainage, diarrhea. Asking if she needs to be seen. Please advise

## 2017-09-09 NOTE — Telephone Encounter (Signed)
I would definitely have her seen by [facility] or PCP today. She is on active treatment.

## 2017-09-09 NOTE — Telephone Encounter (Signed)
NP J Burns attempted to call patient and got her voice mail.

## 2017-09-09 NOTE — Progress Notes (Signed)
[facility] day:  09/09/2017  Chief Complaint: Mariah Hogan is a 67 y.o. female with clinical stage IA left breast cancer who is seen for sick call visit on day 7 of cycle #1 TC chemotherapy.  HPI:  The patient was last seen in the medical oncology clinic by me on 09/03/2017.  At that time, she was doing well. She had no acute complaints today. She described a cough and hoarse voice quality since 03/2016. She received cycle #1 TC with Neulasta support.  She was seen by Faythe Casa, NP on 09/07/2017 for a sore mouth.  Exam revealed thrush on her tongue, inside her cheeks, and back of throat.  She received 3 days of fluconazole as well as Magic Mouthwash with lidocaine.  Patient presents to the clinic today with complaints of "feeling bad".  Patient experienced some nausea on Saturday, which resolved with the prescribed antiemetic. Appetite is decreased. Weight today is 155 lb 13.8 oz (70.7 kg), which compared to her last visit to the clinic, represents a 9 pound decrease since her last visit.   She began to experience significant back pain last night. Pain rated 8/10 in clinic today. Patient febrile at home with a tmax of 100.1. She denies urinary symptoms. She has been having diarrhea since last night. She has had in excess of 10 liquid bowel movements today alone. Secondary to her frequent stooling, she has peri-anal irritation and hemorrhoidal bleeding for which she is using Preparation H topical ointment.   Patient continues to experience significant oral candidiasis. She has been using systemic fluconazole for the last 3 days. She has 2 days of this medication remaining. Additionally, she is using magic mouthwash with lidocaine. Her symptoms has "improved a little".  She is drinking Propel and decaffeinated tea.  Patient complains of a non-productive cough. She is having clear post-nasal drainage. Patient using loratadine daily.   Past Medical  History:  Diagnosis Date  . Arthritis   . Asthma   . Breast cancer ([place]) 08/04/2017   left breast INVASIVE DUCTAL CARCINOMA.  . Cancer ([place])    [phone] basal cell carcinoma  . GERD (gastroesophageal reflux disease)   . Hyperlipidemia   . Hypertension     Past Surgical History:  Procedure Laterality Date  . ABDOMINAL HYSTERECTOMY  1998  . bladder tack  [id number]  . BREAST BIOPSY Bilateral   . BREAST BIOPSY Left 1990  . BREAST BIOPSY Left 2018   core bx- neg  . 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  Marland Kitchen BREAST CYST EXCISION  x3  . BREAST SURGERY Left 02/13/14   Fibrocystic changes, pseudo-angiomatous stromal hyperplasia. No atypia or malignancy.  . CARPAL TUNNEL RELEASE  x2  . CHOLECYSTECTOMY  2008  . COLONOSCOPY WITH PROPOFOL N/A 01/12/2015   Procedure: COLONOSCOPY WITH PROPOFOL;  Surgeon: Manya Silvas, MD;  Location: [facility] ENDOSCOPY;  Service: Endoscopy;  Laterality: N/A;  . ESOPHAGOGASTRODUODENOSCOPY (EGD) WITH PROPOFOL N/A 01/12/2015   Procedure: ESOPHAGOGASTRODUODENOSCOPY (EGD) WITH PROPOFOL;  Surgeon: Manya Silvas, MD;  Location: [facility] ENDOSCOPY;  Service: Endoscopy;  Laterality: N/A;  . NASAL RECONSTRUCTION  1983  . SAVORY DILATION N/A 01/12/2015   Procedure: SAVORY DILATION;  Surgeon: Manya Silvas, MD;  Location: [facility] ENDOSCOPY;  Service: Endoscopy;  Laterality: N/A;  . TONSILLECTOMY AND ADENOIDECTOMY  1956    Family History  Problem Relation Age of Onset  . Breast cancer Sister 39  currently 64  . Breast cancer Paternal Grandmother 57       bilateral; deceased 46s   . Breast cancer Cousin 21       daughter of an unaffected maternal aunt  . Breast cancer Cousin 54       kidney cancer also; daughter of an unaffected maternal aunt  . Other Mother        TAH/BSO prior to menopause  . Prostate cancer Paternal Grandfather        age at dx unknown  . Breast cancer Maternal Aunt        age at dx unknown  .  Breast cancer Cousin 71       daughter of an unaffected maternal aunt    Social History:  reports that she has never smoked. She has never used smokeless tobacco. She reports that she does not drink alcohol or use drugs.  Her daughter is [name], [place]. She is a retired Customer service manager for 35 years. Patient denies known exposures to radiation on toxins. Her daughter's name is [name].  She is alone today.  Allergies:  Allergies  Allergen Reactions  . Penicillin G Rash, Shortness Of Breath and Swelling  . Fluocinonide   . Other Other (See Comments)    Dodecyl gallate: Positive patch test  Elta MD UV Clear SPF 46: Positive patch test  fragrance dodocylgallate dodocylgallate  . Parabens Other (See Comments)    Positive patch test   . Penicillins   . Shellac   . Triamcinolone Acetonide   . Triamcinolone Rash    Current Medications: Current Outpatient Medications  Medication Sig Dispense Refill  . atenolol (TENORMIN) 50 MG tablet Take 50 mg by mouth daily.    . Cholecalciferol 1000 UNITS tablet Take 1,000 Units by mouth daily.    . citalopram (CELEXA) 20 MG tablet Take 20 mg by mouth daily.    . colestipol (COLESTID) 1 G tablet Take 1 g by mouth as needed.     Marland Kitchen dexamethasone (DECADRON) 4 MG tablet Take 2 tablets (8 mg total) by mouth 2 (two) times daily. Start the day before Taxotere. Then again the day after chemo for 3 days. 30 tablet 1  . fluconazole (DIFLUCAN) 200 MG tablet Take 1 tablet (200 mg total) by mouth daily. 5 tablet 0  . loratadine (CLARITIN) 10 MG tablet Take 10 mg by mouth daily.    Marland Kitchen LORazepam (ATIVAN) 0.5 MG tablet Take 1 tablet (0.5 mg total) by mouth every 6 (six) hours as needed (Nausea or vomiting). 30 tablet 0  . meloxicam (MOBIC) 15 MG tablet Take 15 mg by mouth as needed.     . Multiple Vitamin (MULTIVITAMIN) tablet Take 1 tablet by mouth daily.    . ondansetron (ZOFRAN) 8 MG tablet Take 1 tablet (8 mg total) by mouth 2 (two) times daily as needed for refractory  nausea / vomiting. Start on day 3 after chemo. 30 tablet 1  . pantoprazole (PROTONIX) 40 MG tablet Take 40 mg by mouth daily.    . ranitidine (ZANTAC) 300 MG tablet Take 1 tablet (300 mg total) by mouth at bedtime. 30 tablet 5  . triamterene-hydrochlorothiazide (MAXZIDE-25) 37.5-25 MG tablet Take 1 tablet by mouth daily.    . vitamin B-12 (CYANOCOBALAMIN) 1000 MCG tablet Take 1,000 mcg by mouth daily.    . vitamin C (ASCORBIC ACID) 500 MG tablet Take 500 mg by mouth daily.     No current facility-administered medications for this visit.  Review of Systems  Constitutional: Positive for fever (Tmax 100.1), malaise/fatigue and weight loss (down 9 pounds). Negative for diaphoresis.       "I don't feel well at all"  HENT: Negative for nosebleeds and sore throat.        Clear post nasal drip. Hoarse voice quality for every year  Eyes: Negative.   Respiratory: Positive for cough and shortness of breath. Negative for hemoptysis and sputum production.   Cardiovascular: Positive for palpitations. Negative for chest pain, orthopnea, leg swelling and PND.  Gastrointestinal: Positive for diarrhea (chronic) and nausea. Negative for abdominal pain, blood in stool, constipation, melena and vomiting.  Genitourinary: Negative for dysuria, frequency, hematuria and urgency.  Musculoskeletal: Positive for back pain and joint pain (hands and feet). Negative for falls and myalgias.  Skin: Negative for itching and rash.       Bruising to RIGHT breast from recent biopsy. History of BCC to face and leg.   Neurological: Positive for weakness (generalized). Negative for dizziness, tremors and headaches.  Endo/Heme/Allergies: Does not bruise/bleed easily.  Psychiatric/Behavioral: Negative for depression, memory loss and suicidal ideas. The patient is nervous/anxious. The patient does not have insomnia.   All other systems reviewed and are negative.  Performance status (ECOG): 1 - Symptomatic but completely  ambulatory  Physical Exam: Blood pressure 107/71, pulse 93, temperature 98 F (36.7 C), temperature source Tympanic, resp. rate 18, weight 155 lb 13.8 oz (70.7 kg). GENERAL:  Fatigued appearing woman sitting comfortably in the exam room in no acute distress. MENTAL STATUS:  Alert and oriented to person, place and time. HEAD:  Shoulder length brown hair.  Normocephalic, atraumatic, face symmetric, no Cushingoid features. EYES:  Blue eyes.  Pupils equal round and reactive to light and accomodation.  No conjunctivitis or scleral icterus. ENT:  Oropharynx with thrush involving tongue and bilateral buccal mucosa.  Mucositis.  RESPIRATORY:  Clear to auscultation without rales, wheezes or rhonchi. CARDIOVASCULAR:  Regular rate and rhythm without murmur, rub or gallop. ABDOMEN:  Soft, non-tender, with active bowel sounds, and no hepatosplenomegaly.  No guarding or rebound tenderness. No masses. SKIN:  No rashes, ulcers or lesions. EXTREMITIES: No edema, no skin discoloration or tenderness.  No palpable cords. LYMPH NODES: No palpable cervical, supraclavicular, axillary or inguinal adenopathy  NEUROLOGICAL: Unremarkable. PSYCH:  Appropriate.    Appointment on 09/09/2017  Component Date Value Ref Range Status  . Sodium 09/09/2017 133* 135 - 145 mmol/L Final  . Potassium 09/09/2017 4.6  3.5 - 5.1 mmol/L Final  . Chloride 09/09/2017 97* 98 - 111 mmol/L Final   Please note change in reference range.  . CO2 09/09/2017 25  22 - 32 mmol/L Final  . Glucose, Bld 09/09/2017 146* 70 - 99 mg/dL Final   Please note change in reference range.  . BUN 09/09/2017 36* 8 - 23 mg/dL Final   Please note change in reference range.  . Creatinine, Ser 09/09/2017 1.35* 0.44 - 1.00 mg/dL Final  . Calcium 09/09/2017 8.7* 8.9 - 10.3 mg/dL Final  . Total Protein 09/09/2017 6.7  6.5 - 8.1 g/dL Final  . Albumin 09/09/2017 3.6  3.5 - 5.0 g/dL Final  . AST 09/09/2017 18  15 - 41 U/L Final  . ALT 09/09/2017 18  0 - 44 U/L  Final   Please note change in reference range.  . Alkaline Phosphatase 09/09/2017 91  38 - 126 U/L Final  . Total Bilirubin 09/09/2017 1.2  0.3 - 1.2 mg/dL Final  . GFR calc  non Af Amer 09/09/2017 39* >60 mL/min Final  . GFR calc Af Amer 09/09/2017 45* >60 mL/min Final   Comment: (NOTE) The eGFR has been calculated using the CKD EPI equation. This calculation has not been validated in all clinical situations. eGFR's persistently <60 mL/min signify possible Chronic Kidney Disease.   Georgiann Hahn gap 09/09/2017 11  5 - 15 Final   Performed at [facility], [address], [place], [place] [address]  . C Diff antigen 09/09/2017 NEGATIVE  NEGATIVE Final  . C Diff toxin 09/09/2017 NEGATIVE  NEGATIVE Final  . C Diff interpretation 09/09/2017 No C. difficile detected.   Final   Performed at [facility], [address] [place], [place] [address]  . WBC 09/09/2017 1.6* 3.6 - 11.0 K/uL Final  . RBC 09/09/2017 4.69  3.80 - 5.20 MIL/uL Final  . Hemoglobin 09/09/2017 14.7  12.0 - 16.0 g/dL Final  . HCT 09/09/2017 44.1  35.0 - 47.0 % Final  . MCV 09/09/2017 94.1  80.0 - 100.0 fL Final  . MCH 09/09/2017 31.3  26.0 - 34.0 pg Final  . MCHC 09/09/2017 33.2  32.0 - 36.0 g/dL Final  . RDW 09/09/2017 12.4  11.5 - 14.5 % Final  . Platelets 09/09/2017 159  150 - 440 K/uL Final  . Neutrophils Relative % 09/09/2017 49  % Final  . Neutro Abs 09/09/2017 0.8* 1.4 - 6.5 K/uL Final  . Lymphocytes Relative 09/09/2017 47  % Final  . Lymphs Abs 09/09/2017 0.7* 1.0 - 3.6 K/uL Final  . Monocytes Relative 09/09/2017 1  % Final  . Monocytes Absolute 09/09/2017 0.0* 0.2 - 0.9 K/uL Final  . Eosinophils Relative 09/09/2017 2  % Final  . Eosinophils Absolute 09/09/2017 0.0  0 - 0.7 K/uL Final  . Basophils Relative 09/09/2017 1  % Final  . Basophils Absolute 09/09/2017 0.0  0 - 0.1 K/uL Final   Performed at [facility], [address] [place], [place] [address]  . Color, Urine  09/09/2017 AMBER* YELLOW Final   BIOCHEMICALS MAY BE AFFECTED BY COLOR  . APPearance 09/09/2017 HAZY* CLEAR Final  . Specific Gravity, Urine 09/09/2017 1.020  1.005 - 1.030 Final  . pH 09/09/2017 5.5  5.0 - 8.0 Final  . Glucose, UA 09/09/2017 NEGATIVE  NEGATIVE mg/dL Final  . Hgb urine dipstick 09/09/2017 NEGATIVE  NEGATIVE Final  . Bilirubin Urine 09/09/2017 SMALL* NEGATIVE Final  . Ketones, ur 09/09/2017 TRACE* NEGATIVE mg/dL Final  . Protein, ur 09/09/2017 30* NEGATIVE mg/dL Final  . Nitrite 09/09/2017 NEGATIVE  NEGATIVE Final  . Leukocytes, UA 09/09/2017 NEGATIVE  NEGATIVE Final  . Squamous Epithelial / LPF 09/09/2017 6-10  0 - 5 Final  . WBC, UA 09/09/2017 0-5  0 - 5 WBC/hpf Final  . RBC / HPF 09/09/2017 0-5  0 - 5 RBC/hpf Final  . Bacteria, UA 09/09/2017 FEW* NONE SEEN Final  . Mucus 09/09/2017 PRESENT   Final   Performed at [facility], [address] [place], [place] [address]    Assessment:  MEKLIT COTTA is a 67 y.o. female with clinical stage IA left breast cancer s/p biopsy on 08/04/2017.  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  Tumor was ER + (95%), PR+ (20%), and Her 2 neu - by FISH.  Ki67 was 80%.  CA27.29 was 17.3 on 08/06/2017.  MammaPrint was high risk luminal-type B with a 10 year risk of recurrence untreated of 29%  and 94.6% of distant metastasis free interval with chemotherapy + hormonal therapy.  Left breast ultrasound on 08/04/2017 revealed a 1.65 x 1.69 x 1.86 cm multi-lobulated hypoechoic mass in the upper outer quadrant of the left breast in the 2 o'clock position 7 cm from the nipple.  Bilateral breast MRI on 08/20/2017 revealed a 2.4 cm enhancing irregular mass in the outer left breast.  There were several prominent lymph nodes with borderline thicked cortices between 3-4 mm, but normal morphology.  There was a 5 mm enhancing mass in the anterior, retroareolar region of the right breast  RIGHT breast  retroareolar biopsy on 09/02/2017 revealed no evidence of malignancy.  There was fibrocystic change and usual ductal hyperplasia.  Chest CT on 09/23/2016 revealed a 4 mm RUL subpleural nodule.  Chest CT on 08/17/2017 revealed a small 4 mm RUL nodule unchanged in size.  She received neoadjuvant Femara (08/12/2017 - 08/31/2017) secondary to planned a delay in surgery (mastectomy with immediate reconstruction).  Femara was discontinued after high risk Mammaprint returned.  She is day 7 s/p neoadjuvant Taxotere and Cytoxan (09/03/2017).  Bone density on 08/17/2017 revealed osteopenia with a T-score of -1.5 in the left femoral neck and -0.7 in the AP spine L1-L4.  She has a significant family history of breast cancer (paternal grandmother, sister, and 3 maternal cousins).   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  Symptomatically, she is feeling poorly. She has generalized weakness, fever (tmax 100.1) last night, and diarrhea. She has back pain likely due to [facility]. Oral intake poor. She has lost 9 pounds. Non-productive cough persists. Exam is reassuring, revealing grossly clear breath sounds bilaterally. Patient is non-toxic appearing. No labs obtained prior to clinic.   Plan: 1. Labs today: CBC with diff, CMP, BCx x 2, UA with C&S, GI panel, C.diff. 2. Discuss febrile illness of unknown etiology.  She is currently afebrile.  Will send fever workup, including blood, stool and urine studies to further assess.  Obtain diagnostic 2 view CXR due to cough.    Advised to avoid loperamide until after infectious etiology of diarrhea ruled out.   Reviewed neutropenic precautions, including strict handwashing. 3. Discuss weight loss. Patient with poor oral intake. She was encouraged to increased oral fluid intake as much as possible. Discussed probable degree of dehydration. Patient advised to pursue ORT using electrolyte  containing fluids.  4. Discuss symptom management.  Patient has antiemetics and pain medications at home to use on a PRN basis. Patient  advising that the prescribed interventions are adequate at this point. Continue all medications as previously prescribed.  5. Discuss back pain. Could be potentially related to an early urinary tract infection. UA with C&S sent today. Suspected related to growth factor associated bone pain. Encouraged patient to continue daily loratadine.  6. RTC on 09/14/2017 for MD assessment and labs (CBC with diff, BMP). 7. RTC on 09/24/2017 for MD assessment, labs (CBC with diff, CMP, Mg) and cycle #2 TC chemotherapy with Udencya support the following day.  Consider dose reduction based on severe side effects associated with this cycle.   ADDENDUM:  WBC 1600 (ANC 800). BUN 36 with a creatinine of 1.35 (CrCl 39.3 mL/min). Na low at 133. UA (+) for trace ketones. Stool (-) for C.diff. CXR demonstrated no active cardiopulmonary disease. Patient contacted with the results of her workup. Stressed the importance of maintaining strict neutropenic precautions. She was encouraged to avoid travel and sick contacts.  Patient encouraged to increase fluid intake as much as possible. If fever returns, patient to call the clinic on-call provider, as she will likely require admission.   Honor Loh, NP  09/09/2017, 5:06 PM   I saw and evaluated the patient, participating in the key portions of the service and reviewing pertinent diagnostic studies and records.  I reviewed the nurse practitioner's note and agree with the findings and the plan.  The assessment and plan were discussed with the patient.  Multiple questions were asked by the patient and answered.   Nolon Stalls, MD 09/09/2017,5:06 PM

## 2017-09-11 ENCOUNTER — Encounter: Payer: Self-pay | Admitting: Nurse Practitioner

## 2017-09-11 ENCOUNTER — Inpatient Hospital Stay: Payer: Medicare Other | Attending: Nurse Practitioner

## 2017-09-11 ENCOUNTER — Inpatient Hospital Stay: Payer: Medicare Other

## 2017-09-11 ENCOUNTER — Inpatient Hospital Stay (HOSPITAL_BASED_OUTPATIENT_CLINIC_OR_DEPARTMENT_OTHER): Payer: Medicare Other | Admitting: Nurse Practitioner

## 2017-09-11 ENCOUNTER — Other Ambulatory Visit: Payer: Self-pay

## 2017-09-11 ENCOUNTER — Telehealth: Payer: Self-pay | Admitting: *Deleted

## 2017-09-11 ENCOUNTER — Inpatient Hospital Stay
Admission: AD | Admit: 2017-09-11 | Discharge: 2017-09-15 | DRG: 371 | Disposition: A | Discharge status: Home / Self Care | Payer: Medicare Other | Source: Ambulatory Visit | Attending: Internal Medicine | Admitting: Internal Medicine

## 2017-09-11 VITALS — Ht 65.6 in | Wt 154.0 lb

## 2017-09-11 VITALS — BP 123/85 | HR 94 | Temp 97.6°F | Resp 20

## 2017-09-11 DIAGNOSIS — E869 Volume depletion, unspecified: Secondary | ICD-10-CM | POA: Diagnosis present

## 2017-09-11 DIAGNOSIS — Z888 Allergy status to other drugs, medicaments and biological substances status: Secondary | ICD-10-CM | POA: Diagnosis not present

## 2017-09-11 DIAGNOSIS — M1990 Unspecified osteoarthritis, unspecified site: Secondary | ICD-10-CM | POA: Diagnosis present

## 2017-09-11 DIAGNOSIS — R110 Nausea: Secondary | ICD-10-CM

## 2017-09-11 DIAGNOSIS — Z791 Long term (current) use of non-steroidal anti-inflammatories (NSAID): Secondary | ICD-10-CM

## 2017-09-11 DIAGNOSIS — B9620 Unspecified Escherichia coli [E. coli] as the cause of diseases classified elsewhere: Secondary | ICD-10-CM | POA: Diagnosis not present

## 2017-09-11 DIAGNOSIS — R059 Cough, unspecified: Secondary | ICD-10-CM

## 2017-09-11 DIAGNOSIS — E860 Dehydration: Secondary | ICD-10-CM

## 2017-09-11 DIAGNOSIS — I10 Essential (primary) hypertension: Secondary | ICD-10-CM | POA: Diagnosis present

## 2017-09-11 DIAGNOSIS — C50912 Malignant neoplasm of unspecified site of left female breast: Secondary | ICD-10-CM | POA: Diagnosis present

## 2017-09-11 DIAGNOSIS — T451X5A Adverse effect of antineoplastic and immunosuppressive drugs, initial encounter: Secondary | ICD-10-CM | POA: Diagnosis present

## 2017-09-11 DIAGNOSIS — Z91040 Latex allergy status: Secondary | ICD-10-CM

## 2017-09-11 DIAGNOSIS — J45909 Unspecified asthma, uncomplicated: Secondary | ICD-10-CM | POA: Diagnosis present

## 2017-09-11 DIAGNOSIS — B370 Candidal stomatitis: Secondary | ICD-10-CM | POA: Diagnosis not present

## 2017-09-11 DIAGNOSIS — Z79899 Other long term (current) drug therapy: Secondary | ICD-10-CM

## 2017-09-11 DIAGNOSIS — R531 Weakness: Secondary | ICD-10-CM

## 2017-09-11 DIAGNOSIS — A040 Enteropathogenic Escherichia coli infection: Principal | ICD-10-CM | POA: Diagnosis present

## 2017-09-11 DIAGNOSIS — C50412 Malignant neoplasm of upper-outer quadrant of left female breast: Secondary | ICD-10-CM

## 2017-09-11 DIAGNOSIS — M8580 Other specified disorders of bone density and structure, unspecified site: Secondary | ICD-10-CM

## 2017-09-11 DIAGNOSIS — Z90710 Acquired absence of both cervix and uterus: Secondary | ICD-10-CM | POA: Diagnosis not present

## 2017-09-11 DIAGNOSIS — R197 Diarrhea, unspecified: Secondary | ICD-10-CM

## 2017-09-11 DIAGNOSIS — A09 Infectious gastroenteritis and colitis, unspecified: Secondary | ICD-10-CM

## 2017-09-11 DIAGNOSIS — Z170 Estrogen receptor positive status [ER+]: Secondary | ICD-10-CM

## 2017-09-11 DIAGNOSIS — N170 Acute kidney failure with tubular necrosis: Secondary | ICD-10-CM | POA: Diagnosis present

## 2017-09-11 DIAGNOSIS — M549 Dorsalgia, unspecified: Secondary | ICD-10-CM

## 2017-09-11 DIAGNOSIS — Z880 Allergy status to penicillin: Secondary | ICD-10-CM | POA: Diagnosis not present

## 2017-09-11 DIAGNOSIS — N179 Acute kidney failure, unspecified: Secondary | ICD-10-CM | POA: Diagnosis present

## 2017-09-11 DIAGNOSIS — Z853 Personal history of malignant neoplasm of breast: Secondary | ICD-10-CM

## 2017-09-11 DIAGNOSIS — R509 Fever, unspecified: Secondary | ICD-10-CM

## 2017-09-11 DIAGNOSIS — R809 Proteinuria, unspecified: Secondary | ICD-10-CM

## 2017-09-11 DIAGNOSIS — R05 Cough: Secondary | ICD-10-CM

## 2017-09-11 LAB — GASTROINTESTINAL PANEL BY PCR, STOOL (REPLACES STOOL CULTURE)

## 2017-09-11 LAB — CBC WITH DIFFERENTIAL/PLATELET
Band Neutrophils: 12 %
Basophils Absolute: 0.2 10*3/uL — ABNORMAL HIGH (ref 0–0.1)
Basophils Relative: 1 %
Blasts: 0 %
Eosinophils Absolute: 0.2 10*3/uL (ref 0–0.7)
Eosinophils Relative: 1 %
HCT: 45.1 % (ref 35.0–47.0)
Hemoglobin: 15.3 g/dL (ref 12.0–16.0)
Lymphocytes Relative: 10 %
Lymphs Abs: 2.1 10*3/uL (ref 1.0–3.6)
MCH: 31.5 pg (ref 26.0–34.0)
MCHC: 33.9 g/dL (ref 32.0–36.0)
MCV: 92.8 fL (ref 80.0–100.0)
Metamyelocytes Relative: 4 %
Monocytes Absolute: 0.6 10*3/uL (ref 0.2–0.9)
Monocytes Relative: 3 %
Myelocytes: 11 %
Neutro Abs: 16.9 10*3/uL — ABNORMAL HIGH (ref 1.4–6.5)
Neutrophils Relative %: 55 %
Other: 3 %
Platelets: 262 10*3/uL (ref 150–440)
Promyelocytes Relative: 0 %
RBC: 4.86 MIL/uL (ref 3.80–5.20)
RDW: 12.6 % (ref 11.5–14.5)
Smear Review: ADEQUATE
WBC: 20.6 10*3/uL — ABNORMAL HIGH (ref 3.6–11.0)
nRBC: 2 /100 WBC — ABNORMAL HIGH

## 2017-09-11 LAB — COMPREHENSIVE METABOLIC PANEL
ALT: 26 U/L (ref 0–44)
AST: 24 U/L (ref 15–41)
Albumin: 3.7 g/dL (ref 3.5–5.0)
Alkaline Phosphatase: 112 U/L (ref 38–126)
Anion gap: 14 (ref 5–15)
BUN: 37 mg/dL — ABNORMAL HIGH (ref 8–23)
CO2: 22 mmol/L (ref 22–32)
Calcium: 9.1 mg/dL (ref 8.9–10.3)
Chloride: 95 mmol/L — ABNORMAL LOW (ref 98–111)
Creatinine, Ser: 1.87 mg/dL — ABNORMAL HIGH (ref 0.44–1.00)
GFR calc Af Amer: 31 mL/min — ABNORMAL LOW (ref >60)
GFR calc non Af Amer: 26 mL/min — ABNORMAL LOW (ref >60)
Glucose, Bld: 163 mg/dL — ABNORMAL HIGH (ref 70–99)
Potassium: 4.3 mmol/L (ref 3.5–5.1)
Sodium: 131 mmol/L — ABNORMAL LOW (ref 135–145)
Total Bilirubin: 0.7 mg/dL (ref 0.3–1.2)
Total Protein: 7.2 g/dL (ref 6.5–8.1)

## 2017-09-11 LAB — URINE CULTURE

## 2017-09-11 LAB — PATHOLOGIST SMEAR REVIEW

## 2017-09-11 LAB — MAGNESIUM: Magnesium: 2.6 mg/dL — ABNORMAL HIGH (ref 1.7–2.4)

## 2017-09-11 MED ORDER — DOCUSATE SODIUM 100 MG PO CAPS: 100.0000 mg | ORAL_CAPSULE | Freq: Two times a day (BID) | ORAL | Status: DC | PRN

## 2017-09-11 MED ORDER — ATENOLOL 50 MG PO TABS
50.0000 mg | ORAL_TABLET | Freq: Every day | ORAL | Status: DC
  Administered 2017-09-12 – 2017-09-15 (×4): 50 mg via ORAL
  Filled 2017-09-11 (×5): qty 1

## 2017-09-11 MED ORDER — LORAZEPAM 0.5 MG PO TABS: 0.5000 mg | ORAL_TABLET | Freq: Four times a day (QID) | ORAL | Status: DC | PRN

## 2017-09-11 MED ORDER — SODIUM CHLORIDE 0.9 % IV SOLN
INTRAVENOUS | Status: DC
  Administered 2017-09-11 – 2017-09-14 (×6): via INTRAVENOUS

## 2017-09-11 MED ORDER — ADULT MULTIVITAMIN W/MINERALS CH
1.0000 | ORAL_TABLET | Freq: Every day | ORAL | Status: DC
  Administered 2017-09-13 – 2017-09-15 (×3): 1 via ORAL
  Filled 2017-09-11 (×4): qty 1

## 2017-09-11 MED ORDER — ONDANSETRON HCL 4 MG/2ML IJ SOLN
4.0000 mg | Freq: Four times a day (QID) | INTRAMUSCULAR | Status: DC | PRN
  Administered 2017-09-11 – 2017-09-12 (×3): 4 mg via INTRAVENOUS
  Filled 2017-09-11 (×3): qty 2

## 2017-09-11 MED ORDER — VITAMIN C 500 MG PO TABS
500.0000 mg | ORAL_TABLET | Freq: Every day | ORAL | Status: DC
  Administered 2017-09-14 – 2017-09-15 (×2): 500 mg via ORAL
  Filled 2017-09-11 (×5): qty 1

## 2017-09-11 MED ORDER — VITAMIN B-12 1000 MCG PO TABS
1000.0000 ug | ORAL_TABLET | Freq: Every day | ORAL | Status: DC
  Administered 2017-09-13 – 2017-09-15 (×3): 1000 ug via ORAL
  Filled 2017-09-11 (×4): qty 1

## 2017-09-11 MED ORDER — HEPARIN SODIUM (PORCINE) 5000 UNIT/ML IJ SOLN
5000.0000 [IU] | Freq: Three times a day (TID) | INTRAMUSCULAR | Status: DC
  Administered 2017-09-11 – 2017-09-12 (×2): 5000 [IU] via SUBCUTANEOUS
  Filled 2017-09-11 (×7): qty 1

## 2017-09-11 MED ORDER — SODIUM CHLORIDE 0.9 % IV SOLN
INTRAVENOUS | Status: DC
  Administered 2017-09-11: 14:00:00 via INTRAVENOUS
  Filled 2017-09-11 (×2): qty 1000

## 2017-09-11 MED ORDER — ONDANSETRON HCL 4 MG/2ML IJ SOLN
4.0000 mg | Freq: Once | INTRAMUSCULAR | Status: AC
  Administered 2017-09-11: 4 mg via INTRAVENOUS
  Filled 2017-09-11: qty 2

## 2017-09-11 MED ORDER — VITAMIN D 1000 UNITS PO TABS
1000.0000 [IU] | ORAL_TABLET | Freq: Every day | ORAL | Status: DC
  Administered 2017-09-13 – 2017-09-15 (×3): 1000 [IU] via ORAL
  Filled 2017-09-11 (×4): qty 1

## 2017-09-11 MED ORDER — FAMOTIDINE 20 MG PO TABS
20.0000 mg | ORAL_TABLET | Freq: Every day | ORAL | Status: DC
  Administered 2017-09-12 – 2017-09-14 (×3): 20 mg via ORAL
  Filled 2017-09-11 (×4): qty 1

## 2017-09-11 MED ORDER — LORATADINE 10 MG PO TABS
10.0000 mg | ORAL_TABLET | Freq: Every day | ORAL | Status: DC
  Filled 2017-09-11: qty 1

## 2017-09-11 MED ORDER — CITALOPRAM HYDROBROMIDE 20 MG PO TABS
20.0000 mg | ORAL_TABLET | Freq: Every day | ORAL | Status: DC
  Administered 2017-09-12 – 2017-09-15 (×4): 20 mg via ORAL
  Filled 2017-09-11 (×5): qty 1

## 2017-09-11 MED ORDER — SODIUM CHLORIDE 0.9 % IV SOLN
INTRAVENOUS | Status: DC
  Administered 2017-09-11: 16:00:00 via INTRAVENOUS
  Filled 2017-09-11 (×2): qty 1000

## 2017-09-11 MED ORDER — TRIAMTERENE-HCTZ 37.5-25 MG PO TABS
1.0000 | ORAL_TABLET | Freq: Every day | ORAL | Status: DC
  Administered 2017-09-12: 1 via ORAL
  Filled 2017-09-11 (×2): qty 1

## 2017-09-11 MED ORDER — PANTOPRAZOLE SODIUM 40 MG PO TBEC
40.0000 mg | DELAYED_RELEASE_TABLET | Freq: Every day | ORAL | Status: DC
  Administered 2017-09-11 – 2017-09-15 (×5): 40 mg via ORAL
  Filled 2017-09-11 (×5): qty 1

## 2017-09-11 MED ORDER — COLESTIPOL HCL 1 G PO TABS
1.0000 g | ORAL_TABLET | Freq: Every day | ORAL | Status: DC
  Filled 2017-09-11 (×2): qty 1

## 2017-09-11 MED ORDER — SODIUM CHLORIDE 0.9 % IV SOLN
500.0000 mg | INTRAVENOUS | Status: AC
  Administered 2017-09-11 – 2017-09-13 (×3): 500 mg via INTRAVENOUS
  Filled 2017-09-11 (×3): qty 500

## 2017-09-11 NOTE — Progress Notes (Signed)
1000 ml New NS bag hung. Patient to be admitted per Dr. Mike Gip due to renal function. Changed rate per V/O rate to 125 ml/hr.

## 2017-09-11 NOTE — Progress Notes (Signed)
Patient admitted to 1C. Brought to 1C via wheelchair by Nira Conn, RN to room 120. IV saline locked before transport. Call report to RN on 1C

## 2017-09-11 NOTE — H&P (Signed)
[facility] [place] NAME: Mariah Hogan    MR#:  [id number]  DATE OF BIRTH:  April 29, 1950  DATE OF ADMISSION:  09/11/2017  PRIMARY CARE PHYSICIAN: Marinda Elk, MD   REQUESTING/REFERRING PHYSICIAN: [place]  CHIEF COMPLAINT:  No chief complaint on file.   HISTORY OF PRESENT ILLNESS: Mariah Hogan  is a 67 y.o. female with a known history of arthritis, asthma, breast cancer, recent recurrence and started on first chemotherapy 8 days ago, hyperlipidemia, hypertension-started having diarrhea for last 4 to 5 days-went to oncologist clinic where the work-up showed her white blood cell count is running low secondary to her chemotherapy and her stool was negative for C. difficile but positive for EPEC.  She was advised to keep herself hydrated and given some medicines to help with nausea, but continued to have recurrent diarrhea and went back to the clinic today.  She was noted to have acute renal failure and as her diarrhea persist, suggested to admit to the hospital directly for antibiotic treatment and IV fluids.  PAST MEDICAL HISTORY:   Past Medical History:  Diagnosis Date  . Arthritis   . Asthma   . Breast cancer ([place]) 08/04/2017   left breast INVASIVE DUCTAL CARCINOMA.  . Cancer ([place])    [phone] basal cell carcinoma  . GERD (gastroesophageal reflux disease)   . Hyperlipidemia   . Hypertension     PAST SURGICAL HISTORY:  Past Surgical History:  Procedure Laterality Date  . ABDOMINAL HYSTERECTOMY  1998  . bladder tack  [id number]  . BREAST BIOPSY Bilateral   . BREAST BIOPSY Left 1990  . BREAST BIOPSY Left 2018   core bx- neg  . 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  Marland Kitchen BREAST CYST EXCISION  x3  . BREAST SURGERY Left 02/13/14   Fibrocystic changes, pseudo-angiomatous stromal hyperplasia. No atypia or malignancy.  . CARPAL TUNNEL RELEASE  x2  . CHOLECYSTECTOMY  2008  . COLONOSCOPY  WITH PROPOFOL N/A 01/12/2015   Procedure: COLONOSCOPY WITH PROPOFOL;  Surgeon: Manya Silvas, MD;  Location: [facility] ENDOSCOPY;  Service: Endoscopy;  Laterality: N/A;  . ESOPHAGOGASTRODUODENOSCOPY (EGD) WITH PROPOFOL N/A 01/12/2015   Procedure: ESOPHAGOGASTRODUODENOSCOPY (EGD) WITH PROPOFOL;  Surgeon: Manya Silvas, MD;  Location: [facility] ENDOSCOPY;  Service: Endoscopy;  Laterality: N/A;  . NASAL RECONSTRUCTION  1983  . SAVORY DILATION N/A 01/12/2015   Procedure: SAVORY DILATION;  Surgeon: Manya Silvas, MD;  Location[facility] ENDOSCOPY;  Service: Endoscopy;  Laterality: N/A;  . TONSILLECTOMY AND ADENOIDECTOMY  1956    SOCIAL HISTORY:  Social History   Tobacco Use  . Smoking status: Never Smoker  . Smokeless tobacco: Never Used  Substance Use Topics  . Alcohol use: No    Alcohol/week: 0.0 oz    FAMILY HISTORY:  Family History  Problem Relation Age of Onset  . Breast cancer Sister 41       currently 38  . Breast cancer Paternal Grandmother 85       bilateral; deceased 36s   . Breast cancer Cousin 65       daughter of an unaffected maternal aunt  . Breast cancer Cousin 71       kidney cancer also; daughter of an unaffected maternal aunt  . Other Mother        TAH/BSO prior to menopause  . Prostate cancer Paternal Grandfather        age at dx unknown  .  Breast cancer Maternal Aunt        age at dx unknown  . Breast cancer Cousin 87       daughter of an unaffected maternal aunt    DRUG ALLERGIES:  Allergies  Allergen Reactions  . Penicillin G Rash, Shortness Of Breath and Swelling  . Fluocinonide   . Other Other (See Comments)    Dodecyl gallate: Positive patch test  Elta MD UV Clear SPF 46: Positive patch test  fragrance dodocylgallate dodocylgallate  . Parabens Other (See Comments)    Positive patch test   . Penicillins   . Shellac   . Triamcinolone Acetonide   . Latex Rash    rash  . Triamcinolone Rash    REVIEW OF SYSTEMS:   CONSTITUTIONAL: No fever,  fatigue or weakness.  EYES: No blurred or double vision.  EARS, NOSE, AND THROAT: No tinnitus or ear pain.  RESPIRATORY: No cough, shortness of breath, wheezing or hemoptysis.  CARDIOVASCULAR: No chest pain, orthopnea, edema.  GASTROINTESTINAL: No nausea, vomiting, have diarrhea ,no abdominal pain.  GENITOURINARY: No dysuria, hematuria.  ENDOCRINE: No polyuria, nocturia,  HEMATOLOGY: No anemia, easy bruising or bleeding SKIN: No rash or lesion. MUSCULOSKELETAL: No joint pain or arthritis.   NEUROLOGIC: No tingling, numbness, weakness.  PSYCHIATRY: No anxiety or depression.   MEDICATIONS AT HOME:  Prior to Admission medications   Medication Sig Start Date End Date Taking? Authorizing Provider  atenolol (TENORMIN) 50 MG tablet Take 50 mg by mouth daily.    [provider]  Cholecalciferol 1000 UNITS tablet Take 1,000 Units by mouth daily.    [provider]  citalopram (CELEXA) 20 MG tablet Take 20 mg by mouth daily.    [provider]  colestipol (COLESTID) 1 G tablet Take 1 g by mouth as needed.     [provider]  dexamethasone (DECADRON) 4 MG tablet Take 2 tablets (8 mg total) by mouth 2 (two) times daily. Start the day before Taxotere. Then again the day after chemo for 3 days. Patient not taking: Reported on 09/11/2017 08/31/17   Lequita Asal, MD  loratadine (CLARITIN) 10 MG tablet Take 10 mg by mouth daily.    [provider]  LORazepam (ATIVAN) 0.5 MG tablet Take 1 tablet (0.5 mg total) by mouth every 6 (six) hours as needed (Nausea or vomiting). 09/03/17   Lequita Asal, MD  meloxicam (MOBIC) 15 MG tablet Take 15 mg by mouth as needed.     [provider]  Multiple Vitamin (MULTIVITAMIN) tablet Take 1 tablet by mouth daily.    [provider]  ondansetron (ZOFRAN) 8 MG tablet Take 1 tablet (8 mg total) by mouth 2 (two) times daily as needed for refractory nausea / vomiting. Start on day 3 after chemo. 08/30/17    Lequita Asal, MD  pantoprazole (PROTONIX) 40 MG tablet Take 40 mg by mouth daily.    [provider]  ranitidine (ZANTAC) 300 MG tablet Take 1 tablet (300 mg total) by mouth at bedtime. 09/03/16 09/11/37  Wilhelmina Mcardle, MD  triamterene-hydrochlorothiazide (MAXZIDE-25) 37.5-25 MG tablet Take 1 tablet by mouth daily.    [provider]  vitamin B-12 (CYANOCOBALAMIN) 1000 MCG tablet Take 1,000 mcg by mouth daily.    [provider]  vitamin C (ASCORBIC ACID) 500 MG tablet Take 500 mg by mouth daily.    [provider]      PHYSICAL EXAMINATION:   VITAL SIGNS: Blood pressure 137/79, pulse  69, temperature 98.8 F (37.1 C), temperature source Oral, resp. rate 20, weight 70.1 kg (154 lb 9.6 oz), SpO2 96 %.  GENERAL:  67 y.o.-year-old patient lying in the bed with no acute distress.  EYES: Pupils equal, round, reactive to light and accommodation. No scleral icterus. Extraocular muscles intact.  HEENT: Head atraumatic, normocephalic. Oropharynx and nasopharynx clear.  NECK:  Supple, no jugular venous distention. No thyroid enlargement, no tenderness.  LUNGS: Normal breath sounds bilaterally, no wheezing, rales,rhonchi or crepitation. No use of accessory muscles of respiration.  CARDIOVASCULAR: S1, S2 normal. No murmurs, rubs, or gallops.  ABDOMEN: Soft, nontender, nondistended. Bowel sounds present. No organomegaly or mass.  EXTREMITIES: No pedal edema, cyanosis, or clubbing.  NEUROLOGIC: Cranial nerves II through XII are intact. Muscle strength 5/5 in all extremities. Sensation intact. Gait not checked.  PSYCHIATRIC: The patient is alert and oriented x 3.  SKIN: No obvious rash, lesion, or ulcer.   LABORATORY PANEL:   CBC Recent Labs  Lab 09/09/17 1427 09/11/17 1328  WBC 1.6* 20.6*  HGB 14.7 15.3  HCT 44.1 45.1  PLT 159 262  MCV 94.1 92.8  MCH 31.3 31.5  MCHC 33.2 33.9  RDW 12.4 12.6  LYMPHSABS 0.7* 2.1  MONOABS 0.0* 0.6  EOSABS 0.0 0.2   BASOSABS 0.0 0.2*   ------------------------------------------------------------------------------------------------------------------  Chemistries  Recent Labs  Lab 09/09/17 1427 09/11/17 1328  NA 133* 131*  K 4.6 4.3  CL 97* 95*  CO2 25 22  GLUCOSE 146* 163*  BUN 36* 37*  CREATININE 1.35* 1.87*  CALCIUM 8.7* 9.1  MG  --  2.6*  AST 18 24  ALT 18 26  ALKPHOS 91 112  BILITOT 1.2 0.7   ------------------------------------------------------------------------------------------------------------------ estimated creatinine clearance is 28.3 mL/min (A) (by C-G formula based on SCr of 1.87 mg/dL (H)). ------------------------------------------------------------------------------------------------------------------ No results for input(s): TSH, T4TOTAL, T3FREE, THYROIDAB in the last 72 hours.  Invalid input(s): FREET3   Coagulation profile No results for input(s): INR, PROTIME in the last 168 hours. ------------------------------------------------------------------------------------------------------------------- No results for input(s): DDIMER in the last 72 hours. -------------------------------------------------------------------------------------------------------------------  Cardiac Enzymes No results for input(s): CKMB, TROPONINI, MYOGLOBIN in the last 168 hours.  Invalid input(s): CK ------------------------------------------------------------------------------------------------------------------ Invalid input(s): POCBNP  ---------------------------------------------------------------------------------------------------------------  Urinalysis    Component Value Date/Time   COLORURINE AMBER (A) 09/09/2017 1426   APPEARANCEUR HAZY (A) 09/09/2017 1426   LABSPEC 1.020 09/09/2017 1426   PHURINE 5.5 09/09/2017 1426   GLUCOSEU NEGATIVE 09/09/2017 1426   HGBUR NEGATIVE 09/09/2017 1426   BILIRUBINUR SMALL (A) 09/09/2017 1426   KETONESUR TRACE (A) 09/09/2017 1426    PROTEINUR 30 (A) 09/09/2017 1426   NITRITE NEGATIVE 09/09/2017 1426   LEUKOCYTESUR NEGATIVE 09/09/2017 1426     RADIOLOGY: No results found.  EKG: Orders placed or performed in visit on 09/08/07  . EKG 12-Lead    IMPRESSION AND PLAN:  *Acute renal failure Secondary to diarrhea  Continue IV fluids and monitor renal function.  *Enteropathogenic E. coli diarrhea. Conservative treatment did not help much, patient is status post chemotherapy and having renal failure so would suggest to treat with antibiotics. Azithromycin 500 mg 3 times a day. Would give supportive care with IV fluids and nausea medicines. We will check GI panel again for superinfection, C. difficile was -2 days ago.  *Recently diagnosed with recurrent breast cancer Further plan as per oncology.  *Hypertension Continue home medication.   All the records are reviewed and case discussed with ED provider. Management plans discussed with the patient, family  and they are in agreement.  CODE STATUS: full    TOTAL TIME TAKING CARE OF THIS PATIENT: 50 minutes.    Vaughan Basta M.D on 09/11/2017   Between 7am to 6pm - Pager - [phone]  After 6pm go to [email or web address] - password [email or web address] [facility]  Office  [phone]  CC: Primary care physician; Marinda Elk, MD   Note: This dictation was prepared with Dragon dictation along with smaller phrase technology. Any transcriptional errors that result from this process are unintentional.

## 2017-09-11 NOTE — Telephone Encounter (Signed)
NP, but no RN, Rodena Piety would have to do IV fluids

## 2017-09-11 NOTE — Telephone Encounter (Signed)
  Do we have symptom management today?  She needs IVF.  M

## 2017-09-11 NOTE — Telephone Encounter (Signed)
Called Report:  GI Panel positive for enteropathogenic E coli

## 2017-09-11 NOTE — Progress Notes (Signed)
Symptom Management [place]  [phone] Fax:([phone]  Patient Care Team: Marinda Elk, MD as PCP - General (Physician Assistant) Bary Castilla Forest Gleason, MD (General Surgery) Lequita Asal, MD as Medical Oncologist (Hematology and Oncology)   Name of the patient: Mariah Hogan  [id number]  25-Jun-1950   Date of visit: 09/11/17  Diagnosis- Stage IA left breast cancer  Chief complaint/ Reason for visit- Diarrhea/E.Coli  Heme/Onc history:  Oncology History   DEAUN Mariah Hogan is a 67 y.o. female with clinical stage IA left breast cancer s/p biopsy on 08/04/2017.  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  Tumor was ER + (95%), PR+ (20%), and Her 2 neu - by FISH.  Ki67 was 80%.  CA27.29 was 17.3 on 08/06/2017.  MammaPrint was high risk luminal-type B with a 10 year risk of recurrence untreated of 29% and 94.6% of distant metastasis free interval with chemotherapy + hormonal therapy.  Left breast ultrasound on 08/04/2017 revealed a 1.65 x 1.69 x 1.86 cm multi-lobulated hypoechoic mass in the upper outer quadrant of the left breast in the 2 o'clock position 7 cm from the nipple.  Bilateral breast MRI on 08/20/2017 revealed a 2.4 cm enhancing irregular mass in the outer left breast.  There were several prominent lymph nodes with borderline thicked cortices between 3-4 mm, but normal morphology.  There was a 5 mm enhancing mass in the anterior, retroareolar region of the right breast  MRI guided RIGHT breast biopsy done on 09/02/2017.  Pathology is pending.  Chest CT on 09/23/2016 revealed a 4 mm RUL subpleural nodule.  Chest CT on 08/17/2017 revealed a small 4 mm RUL nodule unchanged in size.  She received neoadjuvant Femara (08/12/2017 - 08/31/2017) secondary to planned a delay in surgery (mastectomy with immediate reconstruction).  Bone density on 08/17/2017 revealed osteopenia with a T-score  of -1.5 in the left femoral neck and -0.7 in the AP spine L1-L4.  She has a significant family history of breast cancer (paternal grandmother, sister, and 3 maternal cousins).   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        Invasive ductal carcinoma of left breast ([place])   08/05/2017 Initial Diagnosis    Invasive ductal carcinoma of left breast ([place])      08/30/2017 -  Chemotherapy    The patient had palonosetron (ALOXI) injection 0.25 mg, 0.25 mg, Intravenous,  Once, 1 of 4 cycles Administration: 0.25 mg (09/03/2017) pegfilgrastim-cbqv (UDENYCA) injection 6 mg, 6 mg, Subcutaneous, Once, 1 of 4 cycles Administration: 6 mg (09/04/2017) cyclophosphamide (CYTOXAN) 1,120 mg in sodium chloride 0.9 % 250 mL chemo infusion, 600 mg/m2 = 1,120 mg, Intravenous,  Once, 1 of 4 cycles Administration: 1,120 mg (09/03/2017) DOCEtaxel (TAXOTERE) 140 mg in sodium chloride 0.9 % 250 mL chemo infusion, 75 mg/m2 = 140 mg, Intravenous,  Once, 1 of 4 cycles Administration: 140 mg (09/03/2017)  for chemotherapy treatment.         Interval history- Patient presents to clinic complaining of acute diarrhea.  Symptoms started approximately 5 days ago and have gradually worsened. She is having increased frequency of stool since onset of symptoms with stool episodes occurring upwards of 10 times a day.  She describes diarrhea as watery, loose, pus/yellow like.  She describes stool volume is low to moderate.  She has not noticed any blood in her stool.  She has been reducing oral  intake as symptoms seem exacerbated by food.  Nothing seems to make symptoms better.  Associated symptoms: Weight loss, fever, perianal irritation, back pain and spasms, dry mouth & dry lips. She was seen by Dr. Mike Gip in clinic on 09/09/2017 for similar symptoms.  C. difficile about time was negative, GI panel was positive for enteropathogenic E coli (EPEC). UA  was positive for proteinuria, trace ketones, and few bacteria. Urine culture revealed multiple species and recollection was recommended. Blood cultures did not have growth. Chest x-ray was unremarkable.  Today, she states that she "feels horrible" and "the sickest I've ever been". She lives alone.  Previously, she was seen in [facility] on [facility]/07/2017 and diagnosed with oral candidiasis.  She was treated with 5 days of oral Diflucan and Magic mouthwash with lidocaine.  She states that symptoms have improved Additionally, she complains of postnasal drip, cough and sinus congestion.  Symptoms started over past week have been gradually worsening.  She has been taking oral loratadine without significant improvement in symptoms.   ECOG FS:1 - Symptomatic but completely ambulatory  Review of systems- Review of Systems  Constitutional: Positive for malaise/fatigue and weight loss. Negative for chills and fever.       'this is the worst I've ever felt'  HENT: Positive for congestion. Negative for ear discharge, ear pain, sinus pain, sore throat and tinnitus.        Post nasal drip  Eyes: Negative.   Respiratory: Positive for cough and sputum production. Negative for shortness of breath.   Cardiovascular: Negative for chest pain, palpitations, orthopnea, claudication and leg swelling.  Gastrointestinal: Positive for diarrhea and nausea. Negative for abdominal pain, blood in stool, constipation, heartburn and vomiting.       Perianal irritation  Genitourinary: Negative.   Musculoskeletal: Negative.   Skin: Negative.        Dry lips and skin  Neurological: Positive for weakness. Negative for dizziness, tingling and headaches.  Endo/Heme/Allergies: Negative.   Psychiatric/Behavioral: Negative.      Current treatment- s/p cycle 1 taxotere-cytoxan with Udenyca support on 09/03/17-09/04/17  Allergies  Allergen Reactions  . Penicillin G Rash, Shortness Of Breath and Swelling  . Fluocinonide   . Other Other (See  Comments)    Dodecyl gallate: Positive patch test  Elta MD UV Clear SPF 46: Positive patch test  fragrance dodocylgallate dodocylgallate  . Parabens Other (See Comments)    Positive patch test   . Penicillins   . Shellac   . Triamcinolone Acetonide   . Triamcinolone Rash     Past Medical History:  Diagnosis Date  . Arthritis   . Asthma   . Breast cancer ([place]) 08/04/2017   left breast INVASIVE DUCTAL CARCINOMA.  . Cancer ([place])    [phone] basal cell carcinoma  . GERD (gastroesophageal reflux disease)   . Hyperlipidemia   . Hypertension      Past Surgical History:  Procedure Laterality Date  . ABDOMINAL HYSTERECTOMY  1998  . bladder tack  [id number]  . BREAST BIOPSY Bilateral   . BREAST BIOPSY Left 1990  . BREAST BIOPSY Left 2018   core bx- neg  . 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  Marland Kitchen BREAST CYST EXCISION  x3  . BREAST SURGERY Left 02/13/14   Fibrocystic changes, pseudo-angiomatous stromal hyperplasia. No atypia or malignancy.  . CARPAL TUNNEL RELEASE  x2  . CHOLECYSTECTOMY  2008  . COLONOSCOPY WITH PROPOFOL N/A 01/12/2015   Procedure: COLONOSCOPY WITH  PROPOFOL;  Surgeon: Manya Silvas, MD;  Location[facility] END[facility]COPY;  Service: Endoscopy;  Laterality: N/A;  . ESOPHAGOGASTRODUODENOSCOPY (EGD) WITH PROPOFOL N/A 01/12/2015   Procedure: ESOPHAGOGASTRODUODENOSCOPY (EGD) WITH PROPOFOL;  Surgeon: Manya Silvas, MD;  Location: [facility] ENDOSCOPY;  Service: Endoscopy;  Laterality: N/A;  . NASAL RECONSTRUCTION  1983  . SAVORY DILATION N/A 01/12/2015   Procedure: SAVORY DILATION;  Surgeon: Manya Silvas, MD;  Location: [facility] ENDOSCOPY;  Service: Endoscopy;  Laterality: N/A;  . TONSILLECTOMY AND ADENOIDECTOMY  1956    Social History   Socioeconomic History  . Marital status: Single    Spouse name: Not on file  . Number of children: Not on file  . Years of education: Not on file  . Highest education level: Not on file    Occupational History  . Not on file  Social Needs  . Financial resource strain: Not on file  . Food insecurity:    Worry: Not on file    Inability: Not on file  . Transportation needs:    Medical: Not on file    Non-medical: Not on file  Tobacco Use  . Smoking status: Never Smoker  . Smokeless tobacco: Never Used  Substance and Sexual Activity  . Alcohol use: No    Alcohol/week: 0.0 oz  . Drug use: No  . Sexual activity: Not on file  Lifestyle  . Physical activity:    Days per week: Not on file    Minutes per session: Not on file  . Stress: Not on file  Relationships  . Social connections:    Talks on phone: Not on file    Gets together: Not on file    Attends religious service: Not on file    Active member of club or organization: Not on file    Attends meetings of clubs or organizations: Not on file    Relationship status: Not on file  . Intimate partner violence:    Fear of current or ex partner: Not on file    Emotionally abused: Not on file    Physically abused: Not on file    Forced sexual activity: Not on file  Other Topics Concern  . Not on file  Social History Narrative  . Not on file    Family History  Problem Relation Age of Onset  . Breast cancer Sister 59       currently 81  . Breast cancer Paternal Grandmother 48       bilateral; deceased 44s   . Breast cancer Cousin 16       daughter of an unaffected maternal aunt  . Breast cancer Cousin 7       kidney cancer also; daughter of an unaffected maternal aunt  . Other Mother        TAH/BSO prior to menopause  . Prostate cancer Paternal Grandfather        age at dx unknown  . Breast cancer Maternal Aunt        age at dx unknown  . Breast cancer Cousin 54       daughter of an unaffected maternal aunt     Current Outpatient Medications:  .  atenolol (TENORMIN) 50 MG tablet, Take 50 mg by mouth daily., Disp: , Rfl:  .  Cholecalciferol 1000 UNITS tablet, Take 1,000 Units by mouth daily., Disp:  , Rfl:  .  citalopram (CELEXA) 20 MG tablet, Take 20 mg by mouth daily., Disp: , Rfl:  .  colestipol (COLESTID) 1  G tablet, Take 1 g by mouth as needed. , Disp: , Rfl:  .  dexamethasone (DECADRON) 4 MG tablet, Take 2 tablets (8 mg total) by mouth 2 (two) times daily. Start the day before Taxotere. Then again the day after chemo for 3 days. (Patient not taking: Reported on 09/11/2017), Disp: 30 tablet, Rfl: 1 .  loratadine (CLARITIN) 10 MG tablet, Take 10 mg by mouth daily., Disp: , Rfl:  .  LORazepam (ATIVAN) 0.5 MG tablet, Take 1 tablet (0.5 mg total) by mouth every 6 (six) hours as needed (Nausea or vomiting)., Disp: 30 tablet, Rfl: 0 .  meloxicam (MOBIC) 15 MG tablet, Take 15 mg by mouth as needed. , Disp: , Rfl:  .  Multiple Vitamin (MULTIVITAMIN) tablet, Take 1 tablet by mouth daily., Disp: , Rfl:  .  ondansetron (ZOFRAN) 8 MG tablet, Take 1 tablet (8 mg total) by mouth 2 (two) times daily as needed for refractory nausea / vomiting. Start on day 3 after chemo., Disp: 30 tablet, Rfl: 1 .  pantoprazole (PROTONIX) 40 MG tablet, Take 40 mg by mouth daily., Disp: , Rfl:  .  ranitidine (ZANTAC) 300 MG tablet, Take 1 tablet (300 mg total) by mouth at bedtime., Disp: 30 tablet, Rfl: 5 .  triamterene-hydrochlorothiazide (MAXZIDE-25) 37.5-25 MG tablet, Take 1 tablet by mouth daily., Disp: , Rfl:  .  vitamin B-12 (CYANOCOBALAMIN) 1000 MCG tablet, Take 1,000 mcg by mouth daily., Disp: , Rfl:  .  vitamin C (ASCORBIC ACID) 500 MG tablet, Take 500 mg by mouth daily., Disp: , Rfl:  No current facility-administered medications for this visit.   Facility-Administered Medications Ordered in Other Visits:  .  0.9 %  sodium chloride infusion, , Intravenous, Continuous, Verlon Au, NP, Last Rate: 999 mL/hr at 09/11/17 1523, 999 mL/hr at 09/11/17 1523 .  0.9 %  sodium chloride infusion, , Intravenous, Continuous, Verlon Au, NP, Last Rate: 125 mL/hr at 09/11/17 1549, 125 mL/hr at 09/11/17 1549  Physical  exam:  Vitals:   09/11/17 1345  BP: 123/85  Pulse: 94  Resp: 20  Temp: 97.6 F (36.4 C)  TempSrc: Tympanic   General: Well-developed, well-nourished female in seen in exam room in no acute distress.  Appears fatigued Mental status: Alert and oriented to person, place, and time. Head: Shoulderlength brown hair, normocephalic, atraumatic, face symmetric, no cushingoid features Eyes: Blue eyes, PERRL.  No conjunctivitis or scleral icterus ENT: Oropharynx dry.  Tongue dry.  Lips cracked.  Mild erythema of mucous membranes and oropharynx.  Improved. Respiratory: Clear to auscultation without wheezes, rales, or rhonchi.  Speaking in full sentences.  Sputum producing cough observed.  Cardiovascular: RRR.  Abdomen: soft, active bowel sounds.  Skin: No rashes, ulcers, or lesions.  Dry skin, mouth, and lips. Extremities: No edema, skin discoloration, or tenderness. Neurologic: Nonfocal.  Well oriented, appropriate affect. Psych: Appropriate.   CMP Latest Ref Rng & Units 09/11/2017  Glucose 70 - 99 mg/dL 163(H)  BUN 8 - 23 mg/dL 37(H)  Creatinine 0.44 - 1.00 mg/dL 1.87(H)  Sodium 135 - 145 mmol/L 131(L)  Potassium 3.5 - 5.1 mmol/L 4.3  Chloride 98 - 111 mmol/L 95(L)  CO2 22 - 32 mmol/L 22  Calcium 8.9 - 10.3 mg/dL 9.1  Total Protein 6.5 - 8.1 g/dL 7.2  Total Bilirubin 0.3 - 1.2 mg/dL 0.7  Alkaline Phos 38 - 126 U/L 112  AST 15 - 41 U/L 24  ALT 0 - 44 U/L 26   CBC Latest Ref  Rng & Units 09/11/2017  WBC 3.6 - 11.0 K/uL 20.6(H)  Hemoglobin 12.0 - 16.0 g/dL 15.3  Hematocrit 35.0 - 47.0 % 45.1  Platelets 150 - 440 K/uL 262    No images are attached to the encounter.  Dg Chest 2 View  Result Date: 09/09/2017 CLINICAL DATA:  Fever and cough. EXAM: CHEST - 2 VIEW COMPARISON:  01/12/2015 and prior exams FINDINGS: The cardiomediastinal silhouette is unremarkable. There is no evidence of focal airspace disease, pulmonary edema, suspicious pulmonary nodule/mass, pleural effusion, or  pneumothorax. No acute bony abnormalities are identified. IMPRESSION: No active cardiopulmonary disease. Electronically Signed   By: Margarette Canada M.D.   On: 09/09/2017 17:37   Ct Chest Wo Contrast  Result Date: 08/17/2017 CLINICAL DATA:  Followup lung nodule. EXAM: CT CHEST WITHOUT CONTRAST TECHNIQUE: Multidetector CT imaging of the chest was performed following the standard protocol without IV contrast. COMPARISON:  09/23/2016 FINDINGS: Cardiovascular: The heart size appears normal. Small pericardial effusion versus thickening is similar to previous exam. Aortic atherosclerosis. Mediastinum/Nodes: Normal appearance of the thyroid gland. The trachea appears patent and is midline. Normal appearance of the esophagus. No supraclavicular or axillary adenopathy. Lungs/Pleura: No pleural effusion. No airspace consolidation, atelectasis or pneumothorax. Stable 4 mm nodule within the periphery of the right upper lobe, image 38/2. None no additional pulmonary nodules identified. Upper Abdomen: Mild hepatic steatosis. Previous cholecystectomy. No acute abnormality noted. Musculoskeletal: Left breast lesion containing biopsy clip measures 1.9 cm, image 51/6. 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 No aggressive lytic or sclerotic bone lesions. IMPRESSION: 1. Small 4 mm right upper lobe lung nodule is unchanged when compared with the previous exam. 2. Left breast lesion containing biopsy clip noted 3.  Aortic Atherosclerosis (ICD10-I70.0). Electronically Signed   By: Kerby Moors M.D.   On: 08/17/2017 13:57   Mr Breast Bilateral W Wo Contrast Inc Cad  Result Date: 08/20/2017 CLINICAL DATA:  Breast MRI for extent of disease. 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 Patient has had multiple surgical biopsies on both breasts (left Nov 1990, Dec 2015, Mar 2018 and right Mar 2008 patient states all were negative for cancer.) Family hx paternal grandmother not sure of age  and sister age 74. Started taking a chemo pill 1 week ago. Patient presented to [facility] on 04/10/2017 with reported spontaneous right nipple discharge. An etiology for the discharge was not found at diagnostic imaging. Breast MRI was suggested, but if not performed, short-term right breast diagnostic mammography and ultrasound was recommended. Patient underwent ultrasound-guided core needle biopsy of a left breast mass at the surgical office. Revealing invasive ductal carcinoma and DCIS. LABS:  Labs not drawn at time of imaging. EXAM: BILATERAL BREAST MRI WITH AND WITHOUT CONTRAST TECHNIQUE: Multiplanar, multisequence MR images of both breasts were obtained prior to and following the intravenous administration of 13 ml of MultiHance. THREE-DIMENSIONAL MR IMAGE RENDERING ON INDEPENDENT WORKSTATION: Three-dimensional MR images were rendered by post-processing of the original MR data on an independent workstation. The three-dimensional MR images were interpreted, and findings are reported in the following complete MRI report for this study. Three dimensional images were evaluated at the independent DynaCad workstation COMPARISON:  Previous exam(s). FINDINGS: Breast composition: c. Heterogeneous fibroglandular tissue. Background parenchymal enhancement: Moderate. Right breast: There is a 5 mm enhancing mass in the anterior, retroareolar right breast with slow to moderate wash-in and plateau washout kinetics. Enhancement is homogeneous. No other right  breast masses or areas of abnormal enhancement. Left breast: There is an enhancing irregular mass in the upper outer left breast measuring 2.4 x 2.1 x 2.2 cm. Mass contains artifact from biopsy clip. This represents the recently diagnosed breast carcinoma. There are no other left breast masses or areas of abnormal enhancement. Lymph nodes: There prominent left axillary lymph nodes with cortical thickness is between 3 and 4 mm.  Nodes maintain normal overall morphology with central hilar fat. No prominent right axillary lymph nodes. Ancillary findings:  None. IMPRESSION: 1. 2.4 cm enhancing irregular mass in the outer left breast reflects the recently diagnosed breast carcinoma. 2. Several prominent left axillary lymph nodes with borderline thickened cortices between 3 and 4 mm, but normal morphology. Recommend follow-up left axillary ultrasound for further assessment. 3. 5 mm enhancing mass the anterior, retroareolar region of the right breast. Given the symptoms of clear right nipple discharge current history of left breast carcinoma, MRI guided core needle biopsy is recommended. RECOMMENDATION: 1. Left axillary ultrasound, if this has not already been performed, to assess the sonographic appearance of the prominent lymph nodes noted on MRI. 2. MRI guided biopsy of the small, 5 mm, enhancing retroareolar right breast mass. BI-RADS CATEGORY  4: Suspicious. Electronically Signed   By: Lajean Manes M.D.   On: 08/20/2017 12:50   Dg Bone Density  Result Date: 08/17/2017 EXAM: DUAL X-RAY ABSORPTIOMETRY (DXA) FOR BONE MINERAL DENSITY IMPRESSION: Dear Dr Honor Loh, Your patient Doha Boling completed a BMD test on 08/17/2017 using the [place] (analysis version: 14.10) manufactured by [employer]. The following summarizes the results of our evaluation. PATIENT BIOGRAPHICAL: Name: Hamda, Klutts Patient ID: [id number] Birth Date: [birth year] Height: 66.0 in. Gender: Female Exam Date: 08/17/2017 Weight: 166.0 lbs. Indications: Advanced Age, arthritis, asthma, Bilateral Ovariectomy, Breast ca, Caucasian, high risk meds, Hysterectomy, POSTmenopausal, skin ca Fractures: Treatments: letrozole, multivitamin, Q-VAR ASSESSMENT: The BMD measured at Femur Neck Left is 0.833 g/cm2 with a T-score of -1.5. This patient is considered osteopenic according to [facility]) criteria. Site Region Measured Measured  WHO Young Adult BMD Date       Age      Classification T-score AP Spine L1-L4 08/17/2017 69.1 Normal -0.7 1.098 g/cm2 DualFemur Neck Left 08/17/2017 69.1 Osteopenia -1.5 0.833 g/cm2 World Health Organization [facility]) criteria for post-menopausal, Caucasian Women: Normal:       T-score at or above -1 SD Osteopenia:   T-score between -1 and -2.5 SD Osteoporosis: T-score at or below -2.5 SD RECOMMENDATIONS: 1. All patients should optimize calcium and vitamin D intake. 2. Consider FDA-approved medical therapies in postmenopausal women and men aged 38 years and older, based on the following: a. A hip or vertebral (clinical or morphometric) fracture b. T-score < -2.5 at the femoral neck or spine after appropriate evaluation to exclude secondary causes c. Low bone mass (T-score between -1.0 and -2.5 at the femoral neck or spine) and a 10-year probability of a hip fracture > 3% or a 10-year probability of a major osteoporosis-related fracture > 20% based on the [place]-adapted WHO algorithm d. Clinician judgment and/or patient preferences may indicate treatment for people with 10-year fracture probabilities above or below these levels FOLLOW-UP: Patients with diagnosed cases of osteoporosis or at high risk for fracture should have regular bone mineral density tests. For patients eligible for Medicare, routine testing is allowed once every 2 years. The testing frequency can be increased to one year for patients who have rapidly  progressing disease, those who are receiving or discontinuing medical therapy to restore bone mass, or have additional risk factors. I have reviewed this report, and agree with the above findings. [facility] Radiology Dear Dr Honor Loh, Your patient ERIYANNA KOFOED completed a FRAX assessment on 08/17/2017 using the [place] (analysis version: 14.10) manufactured by [employer]. The following summarizes the results of our evaluation. PATIENT BIOGRAPHICAL: Name: Adiva, Boettner  Patient ID: [id number] Birth Date: March 18, 1950 Height:    66.0 in. Gender:     [age]    [age]       Weight:    166.0 lbs. Ethnicity:  White                            Exam Date: 08/17/2017 FRAX* RESULTS:  (version: 3.5) 10-year Probability of Fracture1 Major Osteoporotic Fracture2 Hip Fracture 9.8% 1.3% Population: [place] (Caucasian) Risk Factors: None Based on Femur (Left) Neck BMD 1 -The 10-year probability of fracture may be lower than reported if the patient has received treatment. 2 -Major Osteoporotic Fracture: Clinical Spine, Forearm, Hip or Shoulder *FRAX is a Materials engineer of the [employer] for Metabolic Bone Disease, a [place] (WHO) [facility]. ASSESSMENT: The probability of a major osteoporotic fracture is 9.8% within the next ten years. The probability of a hip fracture is 1.3% within the next ten years. Electronically Signed   By: Dorise Bullion III M.D   On: 08/17/2017 16:34   US Breast Complete Uni Right Inc Axilla  Result Date: 08/27/2017 Second look ultrasound was planned to determine if an MRI biopsy could be avoided.  The retroareolar area shows several cysts measuring up to 7 mm.  There is a deep-seated hypoechoic/anechoic lesion which would not correspond with an enhancing nodule on imaging.  No clear-cut correlate to the mammogram or ultrasound findings.  No images, no charge.   Mm Clip Placement Right  Result Date: 09/02/2017 CLINICAL DATA:  Evaluate biopsy marker placement EXAM: DIAGNOSTIC RIGHT MAMMOGRAM POST MRI BIOPSY COMPARISON:  Previous exam(s). FINDINGS: Mammographic images were obtained following MRI guided biopsy of a retroareolar right breast mass. The dumbbell shaped clip migrated 2 cm medial to the biopsied mass. IMPRESSION: The dumbbell shaped biopsy clip migrated 2 cm medial to the biopsied mass. Final Assessment: Post Procedure Mammograms for Marker Placement Electronically Signed   By: Dorise Bullion III M.D   On: 09/02/2017 09:41   Mr Rt Breast Bx Johnella Moloney Dev 1st Lesion Image Bx Spec Mr Guide  Addendum Date: 09/03/2017   ADDENDUM REPORT: 09/03/2017 11:03 ADDENDUM: Pathology revealed FIBROCYSTIC CHANGE AND USUAL DUCTAL HYPERPLASIA, DILATED DUCTS, definitive papilloma is not seen of RIGHT breast, retroareolar. This was found to be concordant by Dr. Hassan Rowan. Pathology results were discussed with the patient by telephone. The patient reported doing well after the biopsy with tenderness at the site. Post biopsy instructions and care were reviewed and questions were answered. The patient was encouraged to call [facility] for any additional concerns. The patient has a recent diagnosis of LEFT breast cancer and should follow her outlined treatment plan. Pathology results reported by Roselind Messier, RN on 09/03/2017. Electronically Signed   By: Margarette Canada M.D.   On: 09/03/2017 11:03   Result Date: 09/03/2017 CLINICAL DATA:  67 year old female for tissue sampling of 5 mm RETROAREOLAR RIGHT breast  mass. EXAM: MRI GUIDED CORE NEEDLE BIOPSY OF THE RIGHT BREAST TECHNIQUE: Multiplanar, multisequence MR imaging of the RIGHT breast was performed both before and after administration of intravenous contrast. CONTRAST:  39m MULTIHANCE GADOBENATE DIMEGLUMINE 529 MG/ML IV SOLN COMPARISON:  Previous exams. FINDINGS: I met with the patient, and we discussed the procedure of MRI guided biopsy, including risks, benefits, and alternatives. Specifically, we discussed the risks of infection, bleeding, tissue injury, clip migration, and inadequate sampling. Informed, written consent was given. The usual time out protocol was performed immediately prior to the procedure. Using sterile technique, 1% Lidocaine, MRI guidance, and a 9 gauge vacuum assisted device, biopsy was performed of the 5 mm enhancing mass in the RETROAREOLAR RIGHT breast using a LATERAL approach. At the conclusion of the procedure, a  hourglass shaped tissue marker clip was deployed into the biopsy cavity. Follow-up 2-view mammogram was performed and dictated separately. IMPRESSION: MRI guided biopsy of RETROAREOLAR RIGHT breast mass. No apparent complications. Electronically Signed: By: JMargarette CanadaM.D. On: 09/02/2017 10:01     Assessment and plan- Patient is a 67y.o. female with history of breast cancer [facility].    1. Stage IA left breast cancer- s/p cycle 1 of neoadjuvant Taxotere-Cytoxan Chemotherapy on 09/03/17 with Neulasta support. ANC elevated in setting of Neulasta and GI Infection (see below); denies fever, chills. May need to consider dose reduction for next cycle but will evaluate after acute illness and defer to Dr. CMike Gip  2. Enteropathogenic E. Coli- Evaluated by Dr. CMike Gipon 09/09/17, Given prolonged and severe symptoms, recommend starting antibiotic therapy with Azithromycin 500 mg daily for 3 days. Would consider cipro as second line (dose reduced for renal impairment) as antibiotic resistance is common. Of note, she is allergic to penicillin. Would recommend repeating stool testing to rule out secondary or opportunistic infection. Continue to hold anti-diarrheals. Monitor for HUS and hemorrhagic symptoms.   3. AKI- Cr 1.87, BUN 37. GFR approximately 50% of baseline. 1L NaCl bolus given in clinic followed by maintenance rate of 1247mhr. Recommend evaluation by nephrology while inpatient.   4. Thrush- Completed 5 days of diflucan today. Given her worsening kidney function and improved symptoms, would recommend holding at this time. Continue magic mouthwash as this may be worsening oral intake. Follow up with Dr. CoMike Gips needed.   5. Back Pain- previously UA was positive for ketones, protein, and few bacteria. Culture grew multiple species and recollection was recommended. Given symptoms, would recommend recollection during hospitalization.   6. Cough- etiology unclear; URI  vs asthma. If URI, would anticipate symptom improvement with antibiotics for e.coli. Prior hx of asthma but not on ICS or controller medication. Continue to monitor.   Discussed case with Dr. CoMike Gipho recommends hospitalization for initiation of antibiotic therapy, IV Fluids, and evaluation of renal function by nephrology. Spoke to Dr. VaBoyce Medicihospitalist, who accepts for admission.    Visit Diagnosis 1. Invasive ductal carcinoma of left breast ([place]  2. Enteropathogenic Escherichia coli infection   3. AKI (acute kidney injury) ([facility]  4. Oral pharyngeal candidiasis   5. Proteinuria, unspecified type   6. Cough     Patient expressed understanding and was in agreement with this plan. She also understands that She can call clinic at any time with any questions, concerns, or complaints.    LaBeckey RutterDNP, AGNP-C CaPlat[facility]En[facility]3605 [phone] cell) [phone]) 09/11/17 5:58 PM

## 2017-09-11 NOTE — Telephone Encounter (Addendum)
Patient reports that she is still having diarrhea, usually every time she eats or drinks. Had 7- 10 stools yesterday. She uses [employer]. She states she feels a little dry in the oral mucosa and thinks she may be dehydrated. She sates she is trying to drink, but it is difficult

## 2017-09-11 NOTE — Telephone Encounter (Signed)
Patient agrees to appointment at 115 for lab then IV fluids and NP

## 2017-09-11 NOTE — Progress Notes (Signed)
Family Meeting Note  Advance Directive:yes  Today a meeting took place with the Patient.  The following clinical team members were present during this meeting:MD  The following were discussed:Patient's diagnosis:breast cancer, diarrhea, ac renal failure , Patient's progosis: Unable to determine and Goals for treatment: Full Code  Patient's daughter is healthcare power of attorney.  In any adverse events she would like [place] to do everything initially and let her daughter decide further treatment options.  Additional follow-up to be provided: PMD, nephrology.  Time spent during discussion:20 minutes  Vaughan Basta, MD

## 2017-09-12 LAB — GASTROINTESTINAL PANEL BY PCR, STOOL (REPLACES STOOL CULTURE)

## 2017-09-12 LAB — CBC WITH DIFFERENTIAL/PLATELET
Band Neutrophils: 2 %
Basophils Absolute: 0.2 10*3/uL — ABNORMAL HIGH (ref 0–0.1)
Basophils Relative: 1 %
Blasts: 0 %
Eosinophils Absolute: 0 10*3/uL (ref 0–0.7)
Eosinophils Relative: 0 %
HCT: 39.4 % (ref 35.0–47.0)
Hemoglobin: 13.4 g/dL (ref 12.0–16.0)
Lymphocytes Relative: 16 %
Lymphs Abs: 2.9 10*3/uL (ref 1.0–3.6)
MCH: 31.5 pg (ref 26.0–34.0)
MCHC: 34 g/dL (ref 32.0–36.0)
MCV: 92.9 fL (ref 80.0–100.0)
Metamyelocytes Relative: 3 %
Monocytes Absolute: 0.4 10*3/uL (ref 0.2–0.9)
Monocytes Relative: 2 %
Myelocytes: 1 %
Neutro Abs: 14.6 10*3/uL — ABNORMAL HIGH (ref 1.4–6.5)
Neutrophils Relative %: 75 %
Other: 0 %
Platelets: 203 10*3/uL (ref 150–440)
Promyelocytes Relative: 0 %
RBC: 4.25 MIL/uL (ref 3.80–5.20)
RDW: 12.4 % (ref 11.5–14.5)
Smear Review: ADEQUATE
WBC: 18.1 10*3/uL — ABNORMAL HIGH (ref 3.6–11.0)
nRBC: 0 /100 WBC

## 2017-09-12 LAB — BASIC METABOLIC PANEL
Anion gap: 8 (ref 5–15)
BUN: 29 mg/dL — ABNORMAL HIGH (ref 8–23)
CO2: 25 mmol/L (ref 22–32)
Calcium: 7.9 mg/dL — ABNORMAL LOW (ref 8.9–10.3)
Chloride: 102 mmol/L (ref 98–111)
Creatinine, Ser: 1.38 mg/dL — ABNORMAL HIGH (ref 0.44–1.00)
GFR calc Af Amer: 44 mL/min — ABNORMAL LOW (ref >60)
GFR calc non Af Amer: 38 mL/min — ABNORMAL LOW (ref >60)
Glucose, Bld: 105 mg/dL — ABNORMAL HIGH (ref 70–99)
Potassium: 4.1 mmol/L (ref 3.5–5.1)
Sodium: 135 mmol/L (ref 135–145)

## 2017-09-12 LAB — CBC
HCT: 39.1 % (ref 35.0–47.0)
Hemoglobin: 13.4 g/dL (ref 12.0–16.0)
MCH: 31.9 pg (ref 26.0–34.0)
MCHC: 34.4 g/dL (ref 32.0–36.0)
MCV: 92.9 fL (ref 80.0–100.0)
Platelets: 199 10*3/uL (ref 150–440)
RBC: 4.2 MIL/uL (ref 3.80–5.20)
RDW: 12.5 % (ref 11.5–14.5)
WBC: 17 10*3/uL — ABNORMAL HIGH (ref 3.6–11.0)

## 2017-09-12 MED ORDER — ALBUTEROL SULFATE (2.5 MG/3ML) 0.083% IN NEBU: 3.0000 mL | INHALATION_SOLUTION | RESPIRATORY_TRACT | Status: DC | PRN

## 2017-09-12 NOTE — Consult Note (Signed)
Date: 09/12/2017                  Patient Name:  Mariah Hogan  MRN: [id number]  DOB: 1950-03-14  Age / Sex: 67 y.o., female         PCP: Marinda Elk, MD                 Service Requesting Consult: IM/ Epifanio Lesches, MD                 Reason for Consult: ARF            History of Present Illness: Patient is a 67 y.o.  female with medical problems of Stage 1A invasive ductal breast cancer treated with chemo (taxotere-cytoxan), Neulasta,, who was admitted to [facility] on 09/11/2017 for evaluation of diarrhea and is Dx with Enteropathogenic E Coli and AKI Baseline Cr 0.94 from 09/03/17 Progressive increase with peak at 1.87 yesterday Improved to 1.38 today   Medications: Outpatient medications: Medications Prior to Admission  Medication Sig Dispense Refill Last Dose  . atenolol (TENORMIN) 50 MG tablet Take 50 mg by mouth daily.   09/11/2017 at 1000  . Cholecalciferol 1000 UNITS tablet Take 1,000 Units by mouth daily.   09/10/2017 at am  . citalopram (CELEXA) 20 MG tablet Take 20 mg by mouth daily.   09/11/2017 at am  . LORazepam (ATIVAN) 0.5 MG tablet Take 1 tablet (0.5 mg total) by mouth every 6 (six) hours as needed (Nausea or vomiting). 30 tablet 0 PRn at PRN  . meloxicam (MOBIC) 15 MG tablet Take 15 mg by mouth daily as needed for pain.    PRN at PRN  . ondansetron (ZOFRAN) 8 MG tablet Take 1 tablet (8 mg total) by mouth 2 (two) times daily as needed for refractory nausea / vomiting. Start on day 3 after chemo. 30 tablet 1 PRN at PRN  . pantoprazole (PROTONIX) 40 MG tablet Take 40 mg by mouth daily.   09/11/2017 at am  . prednisoLONE (ORAPRED) 15 MG/5ML solution Take 5-10 mLs by mouth 4 (four) times daily.   09/11/2017 at am  . ranitidine (ZANTAC) 300 MG tablet Take 1 tablet (300 mg total) by mouth at bedtime. 30 tablet 5 09/09/2017 at pm  . triamterene-hydrochlorothiazide (MAXZIDE-25) 37.5-25 MG tablet Take 1 tablet by mouth daily.   09/11/2017 at am  . valACYclovir (VALTREX) 500  MG tablet Take 500 mg by mouth daily.   09/11/2017 at am  . dexamethasone (DECADRON) 4 MG tablet Take 2 tablets (8 mg total) by mouth 2 (two) times daily. Start the day before Taxotere. Then again the day after chemo for 3 days. (Patient not taking: Reported on 09/11/2017) 30 tablet 1 Not Taking at Unknown time  . loratadine (CLARITIN) 10 MG tablet Take 10 mg by mouth at bedtime.    09/10/2017 at pm    Current medications: Current Facility-Administered Medications  Medication Dose Route Frequency Provider Last Rate Last Dose  . 0.9 %  sodium chloride infusion   Intravenous Continuous Vaughan Basta, MD 100 mL/hr at 09/12/17 0515    . atenolol (TENORMIN) tablet 50 mg  50 mg Oral Daily Vaughan Basta, MD   50 mg at 09/12/17 0739  . azithromycin (ZITHROMAX) 500 mg in sodium chloride 0.9 % 250 mL IVPB  500 mg Intravenous Q24H Vaughan Basta, MD   Stopped at 09/11/17 1956  . cholecalciferol (VITAMIN D) tablet 1,000 Units  1,000 Units Oral Daily Vaughan Basta, MD      .  citalopram (CELEXA) tablet 20 mg  20 mg Oral Daily Vaughan Basta, MD      . colestipol (COLESTID) tablet 1 g  1 g Oral Daily Vaughan Basta, MD      . docusate sodium (COLACE) capsule 100 mg  100 mg Oral BID PRN Vaughan Basta, MD      . famotidine (PEPCID) tablet 20 mg  20 mg Oral QHS Vaughan Basta, MD      . heparin injection 5,000 Units  5,000 Units Subcutaneous Q8H Vaughan Basta, MD   5,000 Units at 09/12/17 0516  . loratadine (CLARITIN) tablet 10 mg  10 mg Oral Daily Vaughan Basta, MD      . LORazepam (ATIVAN) tablet 0.5 mg  0.5 mg Oral Q6H PRN Vaughan Basta, MD      . multivitamin with minerals tablet 1 tablet  1 tablet Oral Daily Vaughan Basta, MD      . ondansetron [facility]) injection 4 mg  4 mg Intravenous Q6H PRN Vaughan Basta, MD   4 mg at 09/12/17 0739  . pantoprazole (PROTONIX) EC tablet 40 mg  40 mg Oral Daily  Vaughan Basta, MD   40 mg at 09/12/17 0739  . vitamin B-12 (CYANOCOBALAMIN) tablet 1,000 mcg  1,000 mcg Oral Daily Vaughan Basta, MD      . vitamin C (ASCORBIC ACID) tablet 500 mg  500 mg Oral Daily Vaughan Basta, MD          Allergies: Allergies  Allergen Reactions  . Penicillin G Shortness Of Breath, Swelling and Rash    Has patient had a PCN reaction causing immediate rash, facial/tongue/throat swelling, SOB or lightheadedness with hypotension: Yes Has patient had a PCN reaction causing severe rash involving mucus membranes or skin necrosis: No Has patient had a PCN reaction that required hospitalization: No Has patient had a PCN reaction occurring within the last 10 years: No If all of the above answers are "NO", then may proceed with Cephalosporin use.  Marland Kitchen Fluocinonide   . Other Other (See Comments)    Dodecyl gallate: Positive patch test  Elta MD UV Clear SPF 46: Positive patch test  fragrance dodocylgallate dodocylgallate  . Parabens Other (See Comments)    Positive patch test   . Shellac   . Triamcinolone Acetonide   . Latex Rash    rash  . Triamcinolone Rash      Past Medical History: Past Medical History:  Diagnosis Date  . Arthritis   . Asthma   . Breast cancer ([place]) 08/04/2017   left breast INVASIVE DUCTAL CARCINOMA.  . Cancer ([place])    [phone] basal cell carcinoma  . GERD (gastroesophageal reflux disease)   . Hyperlipidemia   . Hypertension      Past Surgical History: Past Surgical History:  Procedure Laterality Date  . ABDOMINAL HYSTERECTOMY  1998  . bladder tack  2094,7096  . BREAST BIOPSY Bilateral   . BREAST BIOPSY Left 1990  . BREAST BIOPSY Left 2018   core bx- neg  . 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  Marland Kitchen BREAST CYST EXCISION  x3  . BREAST SURGERY Left 02/13/14   Fibrocystic changes, pseudo-angiomatous stromal hyperplasia. No atypia or malignancy.  . CARPAL TUNNEL  RELEASE  x2  . CHOLECYSTECTOMY  2008  . COLONOSCOPY WITH PROPOFOL N/A 01/12/2015   Procedure: COLONOSCOPY WITH PROPOFOL;  Surgeon: Manya Silvas, MD;  [facility];  Service: Endoscopy;  Laterality: N/A;  . ESOPHAGOGASTRODUODENOSCOPY (EGD) WITH PROPOFOL  N/A 01/12/2015   Procedure: ESOPHAGOGASTRODUODENOSCOPY (EGD) WITH PROPOFOL;  Surgeon: Manya Silvas, MD;  Location: [facility] ENDOSCOPY;  Service: Endoscopy;  Laterality: N/A;  . NASAL RECONSTRUCTION  1983  . SAVORY DILATION N/A 01/12/2015   Procedure: SAVORY DILATION;  Surgeon: Manya Silvas, MD;  Location: [facility] ENDOSCOPY;  Service: Endoscopy;  Laterality: N/A;  . TONSILLECTOMY AND ADENOIDECTOMY  1956     Family History: Family History  Problem Relation Age of Onset  . Breast cancer Sister 76       currently 41  . Breast cancer Paternal Grandmother 33       bilateral; deceased 27s   . Breast cancer Cousin 13       daughter of an unaffected maternal aunt  . Breast cancer Cousin 63       kidney cancer also; daughter of an unaffected maternal aunt  . Other Mother        TAH/BSO prior to menopause  . Prostate cancer Paternal Grandfather        age at dx unknown  . Breast cancer Maternal Aunt        age at dx unknown  . Breast cancer Cousin 65       daughter of an unaffected maternal aunt     Social History: Social History   Socioeconomic History  . Marital status: Single    Spouse name: Not on file  . Number of children: Not on file  . Years of education: Not on file  . Highest education level: Not on file  Occupational History  . Not on file  Social Needs  . Financial resource strain: Not on file  . Food insecurity:    Worry: Not on file    Inability: Not on file  . Transportation needs:    Medical: Not on file    Non-medical: Not on file  Tobacco Use  . Smoking status: Never Smoker  . Smokeless tobacco: Never Used  Substance and Sexual Activity  . Alcohol use: No    Alcohol/week: 0.0 oz  . Drug  use: No  . Sexual activity: Not on file  Lifestyle  . Physical activity:    Days per week: Not on file    Minutes per session: Not on file  . Stress: Not on file  Relationships  . Social connections:    Talks on phone: Not on file    Gets together: Not on file    Attends religious service: Not on file    Active member of club or organization: Not on file    Attends meetings of clubs or organizations: Not on file    Relationship status: Not on file  . Intimate partner violence:    Fear of current or ex partner: Not on file    Emotionally abused: Not on file    Physically abused: Not on file    Forced sexual activity: Not on file  Other Topics Concern  . Not on file  Social History Narrative  . Not on file     Review of Systems: Gen: Weakness HEENT: No vision changes  CV: no chest pain no shortness of breath Resp: Dry cough for the year and a half.  Has had outpatient work-up. GI: Severe diarrhea for about a week and a half.  No blood in the stool.  Has diarrhea and she eats GU : No blood in the urine, MS: No joint pains or swellings Derm:  Denies any rashes Psych: No complaints Heme: Getting treatment  for breast cancer Neuro: No complaints Endocrine.  No complaints  Vital Signs: Blood pressure (!) 143/72, pulse 68, temperature 98.7 F (37.1 C), temperature source Oral, resp. rate 18, height 5' 5"  (1.651 m), weight 70.1 kg (154 lb 9.6 oz), SpO2 98 %.   Intake/Output Summary (Last 24 hours) at 09/12/2017 1005 Last data filed at 09/12/2017 4481 Gross per 24 hour  Intake 1136.67 ml  Output -  Net 1136.67 ml    Weight trends: [employer]   09/11/17 1737  Weight: 70.1 kg (154 lb 9.6 oz)    Physical Exam: General:  No acute distress, laying in the bed  HEENT  dry oral mucous membranes  Neck:  Supple, no JVD  Lungs:  Clear to auscultation bilaterally  Heart::  No rub or gallop  Abdomen:  Soft, nondistended, nontender  Extremities:  No peripheral edema   Neurologic:  Alert, oriented  Skin:  Warm, dry, somewhat decreased turgor             Lab results: Basic Metabolic Panel: Recent Labs  Lab 09/09/17 1427 09/11/17 1328 09/12/17 0448  NA 133* 131* 135  K 4.6 4.3 4.1  CL 97* 95* 102  CO2 25 22 25   GLUCOSE 146* 163* 105*  BUN 36* 37* 29*  CREATININE 1.35* 1.87* 1.38*  CALCIUM 8.7* 9.1 7.9*  MG  --  2.6*  --     Liver Function Tests: Recent Labs  Lab 09/11/17 1328  AST 24  ALT 26  ALKPHOS 112  BILITOT 0.7  PROT 7.2  ALBUMIN 3.7   No results for input(s): LIPASE, AMYLASE in the last 168 hours. No results for input(s): AMMONIA in the last 168 hours.  CBC: Recent Labs  Lab 09/09/17 1427 09/11/17 1328 09/12/17 0448  WBC 1.6* 20.6* 17.0*  NEUTROABS 0.8* 16.9*  --   HGB 14.7 15.3 13.4  HCT 44.1 45.1 39.1  MCV 94.1 92.8 92.9  PLT 159 262 199    Cardiac Enzymes: No results for input(s): CKTOTAL, TROPONINI in the last 168 hours.  BNP: Invalid input(s): POCBNP  CBG: No results for input(s): GLUCAP in the last 168 hours.  Microbiology: Recent Results (from the past 720 hour(s))  Gastrointestinal Panel by PCR , Stool     Status: Abnormal   Collection Time: 09/09/17  2:26 PM  Result Value Ref Range Status   Campylobacter species NOT DETECTED NOT DETECTED Final   Plesimonas shigelloides NOT DETECTED NOT DETECTED Final   Salmonella species NOT DETECTED NOT DETECTED Final   Yersinia enterocolitica NOT DETECTED NOT DETECTED Final   Vibrio species NOT DETECTED NOT DETECTED Final   Vibrio cholerae NOT DETECTED NOT DETECTED Final   Enteroaggregative E coli (EAEC) NOT DETECTED NOT DETECTED Final   Enteropathogenic E coli (EPEC) DETECTED (A) NOT DETECTED Final    Comment: RESULT CALLED TO, READ BACK BY AND VERIFIED WITH: BRENDA ELLINGTON ON 09/11/17 AT 0953 QSD    Enterotoxigenic E coli (ETEC) NOT DETECTED NOT DETECTED Final   Shiga like toxin producing E coli (STEC) NOT DETECTED NOT DETECTED Final    Shigella/Enteroinvasive E coli (EIEC) NOT DETECTED NOT DETECTED Final   Cryptosporidium NOT DETECTED NOT DETECTED Final   Cyclospora cayetanensis NOT DETECTED NOT DETECTED Final   Entamoeba histolytica NOT DETECTED NOT DETECTED Final   Giardia lamblia NOT DETECTED NOT DETECTED Final   Adenovirus F40/41 NOT DETECTED NOT DETECTED Final   Astrovirus NOT DETECTED NOT DETECTED Final   Norovirus GI/GII NOT DETECTED NOT DETECTED Final  Rotavirus A NOT DETECTED NOT DETECTED Final   Sapovirus (I, II, IV, and V) NOT DETECTED NOT DETECTED Final    Comment: Performed at [facility], [address], [place], [place] [address]  Urine Culture     Status: Abnormal   Collection Time: 09/09/17  2:26 PM  Result Value Ref Range Status   Specimen Description   Final    URINE, CLEAN CATCH Performed at [facility], [address] [place], [place] [id number]    Special Requests   Final    NONE Performed at [facility], [address] [place], [place] [address]    Culture MULTIPLE SPECIES PRESENT, SUGGEST RECOLLECTION (A)  Final   Report Status 09/11/2017 FINAL  Final  C Difficile Quick Screen w PCR reflex     Status: None   Collection Time: 09/09/17  2:27 PM  Result Value Ref Range Status   C Diff antigen NEGATIVE NEGATIVE Final   C Diff toxin NEGATIVE NEGATIVE Final   C Diff interpretation No C. difficile detected.  Final    Comment: Performed at [facility], [address], [place], [place] [address]  Culture, blood (Routine X 2) w Reflex to ID Panel     Status: None (Preliminary result)   Collection Time: 09/09/17  2:27 PM  Result Value Ref Range Status   Specimen Description   Final    BLOOD Performed at [facility], [address], [place], [place] [address]    Special Requests   Final    LEFT ANTECUBITAL Performed at [facility], [address] [place], [place] [address]    Culture   Final    NO GROWTH 3  DAYS Performed at [facility], [address] [place], [place] [address]    Report Status PENDING  Incomplete  Culture, blood (Routine X 2) w Reflex to ID Panel     Status: None (Preliminary result)   Collection Time: 09/09/17  2:27 PM  Result Value Ref Range Status   Specimen Description   Final    BLOOD Performed at [facility], [address], [place], [place] [address]    Special Requests   Final    RIGHT ANTECUBITAL Performed at [facility] [place], [address], [place], [place] [address]    Culture   Final    NO GROWTH 3 DAYS Performed at [facility], [address] [place], [place] [address]    Report Status PENDING  Incomplete  Gastrointestinal Panel by PCR , Stool     Status: Abnormal   Collection Time: 09/11/17 10:08 PM  Result Value Ref Range Status   Campylobacter species NOT DETECTED NOT DETECTED Final   Plesimonas shigelloides NOT DETECTED NOT DETECTED Final   Salmonella species NOT DETECTED NOT DETECTED Final   Yersinia enterocolitica NOT DETECTED NOT DETECTED Final   Vibrio species NOT DETECTED NOT DETECTED Final   Vibrio cholerae NOT DETECTED NOT DETECTED Final   Enteroaggregative E coli (EAEC) NOT DETECTED NOT DETECTED Final   Enteropathogenic E coli (EPEC) DETECTED (A) NOT DETECTED Final    Comment: RESULT CALLED TO, READ BACK BY AND VERIFIED WITH: JEANNE MUCESI ON 09/12/17 AT 0024 [facility]    Enterotoxigenic E coli (ETEC) NOT DETECTED NOT DETECTED Final   Shiga like toxin producing E coli (STEC) NOT DETECTED NOT DETECTED Final   Shigella/Enteroinvasive E coli (EIEC) NOT DETECTED NOT DETECTED Final   Cryptosporidium NOT  DETECTED NOT DETECTED Final   Cyclospora cayetanensis NOT DETECTED NOT DETECTED Final   Entamoeba histolytica NOT DETECTED NOT DETECTED Final   Giardia lamblia NOT DETECTED NOT DETECTED Final   Adenovirus F40/41 NOT DETECTED NOT DETECTED Final   Astrovirus NOT DETECTED NOT DETECTED Final   Norovirus  GI/GII NOT DETECTED NOT DETECTED Final   Rotavirus A NOT DETECTED NOT DETECTED Final   Sapovirus (I, II, IV, and V) NOT DETECTED NOT DETECTED Final    Comment: Performed at [facility], [address] [place], [place] [address]     Coagulation Studies: No results for input(s): LABPROT, INR in the last 72 hours.  Urinalysis: Recent Labs    09/09/17 1426  COLORURINE AMBER*  LABSPEC 1.020  PHURINE 5.5  GLUCOSEU NEGATIVE  HGBUR NEGATIVE  BILIRUBINUR SMALL*  KETONESUR TRACE*  PROTEINUR 30*  NITRITE NEGATIVE  LEUKOCYTESUR NEGATIVE        Imaging:  No results found.   Assessment & Plan: Pt is a 67 y.o. caucasian  female with with medical problems of Stage 1A invasive ductal breast cancer treated with chemo (taxotere-cytoxan), Neulasta,, who was admitted to [facility] on 09/11/2017 for evaluation of diarrhea and is Dx with Enteropathogenic E Coli and AKI  1.  Acute renal failure 2.  Acute gastroenteritis from enteropathogenic E. coli  Serum creatinine has improved to 1.38 with IV hydration Continue normal saline at current rate, encourage oral fluid intake Monitor renal function closely Agree with stopping meloxicam Avoid IV contrast, nephrotoxins, NSAIDs We will follow      LOS: [place] 7/6/201910:05 AM  [place], [place]  Note: This note was prepared with Dragon dictation. Any transcription errors are unintentional

## 2017-09-12 NOTE — Progress Notes (Signed)
[facility] [place] NAME: Mariah Hogan    MR#:  [id number]  DATE OF BIRTH:  [birth year]  SUBJECTIVE: 67 year old female patient admitted for diarrhea, nausea found to have enteropathogenic E. coli.  Patient also found to have acute renal failure secondary to diarrhea, GI losses.  She is started on IV fluids, IV azithromycin and she feels much better, diarrhea is less than yesterday.  She has no nausea today tolerating the diet.  CHIEF COMPLAINT:  No chief complaint on file. Patient has a history of breast cancer on chemotherapy, Neulasta admitted for diarrhea, acute renal failure.  Today she feels better than yesterday but does have some cough. 3 loose stools since admission REVIEW OF SYSTEMS:    Review of Systems  Constitutional: Negative for chills and fever.  HENT: Negative for hearing loss.   Eyes: Negative for blurred vision, double vision and photophobia.  Respiratory: Positive for cough. Negative for hemoptysis and shortness of breath.   Cardiovascular: Negative for palpitations, orthopnea and leg swelling.  Gastrointestinal: Positive for diarrhea. Negative for abdominal pain and vomiting.  Genitourinary: Negative for dysuria and urgency.  Musculoskeletal: Negative for myalgias and neck pain.  Skin: Negative for rash.  Neurological: Negative for dizziness, focal weakness, seizures, weakness and headaches.  Psychiatric/Behavioral: Negative for memory loss. The patient does not have insomnia.     Nutrition:  Tolerating Diet: Tolerating PT:      DRUG ALLERGIES:   Allergies  Allergen Reactions  . Penicillin G Shortness Of Breath, Swelling and Rash    Has patient had a PCN reaction causing immediate rash, facial/tongue/throat swelling, SOB or lightheadedness with hypotension: Yes Has patient had a PCN reaction causing severe rash involving mucus membranes or skin necrosis: No Has patient had a PCN reaction that required  hospitalization: No Has patient had a PCN reaction occurring within the last 10 years: No If all of the above answers are "NO", then may proceed with Cephalosporin use.  Marland Kitchen Fluocinonide   . Other Other (See Comments)    Dodecyl gallate: Positive patch test  Elta MD UV Clear SPF 46: Positive patch test  fragrance dodocylgallate dodocylgallate  . Parabens Other (See Comments)    Positive patch test   . Shellac   . Triamcinolone Acetonide   . Latex Rash    rash  . Triamcinolone Rash    VITALS:  Blood pressure (!) 143/72, pulse 68, temperature 98.7 F (37.1 C), temperature source Oral, resp. rate 18, height 5' 5"  (1.651 m), weight 70.1 kg (154 lb 9.6 oz), SpO2 98 %.  PHYSICAL EXAMINATION:   Physical Exam  GENERAL:  67 y.o.-year-old patient lying in the bed with no acute distress.  EYES: Pupils equal, round, reactive to light and accommodation. No scleral icterus. Extraocular muscles intact.  HEENT: Head atraumatic, normocephalic. Oropharynx and nasopharynx clear.  NECK:  Supple, no jugular venous distention. No thyroid enlargement, no tenderness.  LUNGS: Normal breath sounds bilaterally, no wheezing, rales,rhonchi or crepitation. No use of accessory muscles of respiration.  CARDIOVASCULAR: S1, S2 normal. No murmurs, rubs, or gallops.  ABDOMEN: Soft, nontender, nondistended. Bowel sounds present. No organomegaly or mass.  EXTREMITIES: No pedal edema, cyanosis, or clubbing.  NEUROLOGIC: Cranial nerves II through XII are intact. Muscle strength 5/5 in all extremities. Sensation intact. Gait not checked.  PSYCHIATRIC: The patient is alert and oriented x 3.  SKIN: No obvious rash, lesion, or ulcer.    LABORATORY PANEL:   CBC Recent Labs  Lab 09/12/17 0448  WBC 17.0*  HGB 13.4  HCT 39.1  PLT 199   ------------------------------------------------------------------------------------------------------------------  Chemistries  Recent Labs  Lab 09/11/17 1328 09/12/17 0448   NA 131* 135  K 4.3 4.1  CL 95* 102  CO2 22 25  GLUCOSE 163* 105*  BUN 37* 29*  CREATININE 1.87* 1.38*  CALCIUM 9.1 7.9*  MG 2.6*  --   AST 24  --   ALT 26  --   ALKPHOS 112  --   BILITOT 0.7  --    ------------------------------------------------------------------------------------------------------------------  Cardiac Enzymes No results for input(s): TROPONINI in the last 168 hours. ------------------------------------------------------------------------------------------------------------------  RADIOLOGY:  No results found.   ASSESSMENT AND PLAN:   Principal Problem:   Acute renal failure (ARF) (HCC) Active Problems:   Diarrhea   #48.  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 Coli.   1.  Acute gastroenteritis due to enteropathogenic E. coli, because of her immunosuppressed state with chemotherapy from cancer started on azithromycin 500 mg daily for 3 days.  Patient diarrhea is less.  Continue to watch closely in the hospital. 2.  Acute renal failure secondary to GI losses: Continue IV fluids, renal function improved, seen by nephrology, had and is decreased, improved from 1.87-1.38.,  Discontinued meloxicam, advised in his 48s, nephrotoxic agents, IV contrast.    3.  Leukopenia status post Neulasta, patient white count is up and she is wondering why it is increased explained that due to the acute infection it can go up.  4.  Recently diagnosed recurrent breast cancer: Follow-up with oncology as per their recommendation. Patient's daughter is gastroenterology PA. For nausea: Continue Zofran for nausea.  Chronic cough: Patient can use inhalers if needed.  But no wheezing on exam.    All the records are reviewed and case discussed with Care Management/Social Workerr. Management plans  discussed with the patient, family and they are in agreement.  CODE STATUS: Full code  TOTAL TIME TAKING CARE OF THIS PATIENT: 35 minutes POSSIBLE D/C IN 1-2 DAYS, DEPENDING ON CLINICAL CONDITION.   Epifanio Lesches M.D on 09/12/2017 at 12:01 PM  Between 7am to 6pm - Pager - [phone]  After 6pm go to [email or web address] - password [email or web address] [facility]  Office  [phone]  CC: Primary care physician; Marinda Elk, MD

## 2017-09-13 LAB — BASIC METABOLIC PANEL
Anion gap: 5 (ref 5–15)
BUN: 15 mg/dL (ref 8–23)
CO2: 25 mmol/L (ref 22–32)
Calcium: 7.9 mg/dL — ABNORMAL LOW (ref 8.9–10.3)
Chloride: 106 mmol/L (ref 98–111)
Creatinine, Ser: 1.11 mg/dL — ABNORMAL HIGH (ref 0.44–1.00)
GFR calc Af Amer: 57 mL/min — ABNORMAL LOW (ref >60)
GFR calc non Af Amer: 49 mL/min — ABNORMAL LOW (ref >60)
Glucose, Bld: 93 mg/dL (ref 70–99)
Potassium: 4.4 mmol/L (ref 3.5–5.1)
Sodium: 136 mmol/L (ref 135–145)

## 2017-09-13 LAB — HIV ANTIBODY (ROUTINE TESTING W REFLEX): HIV Screen 4th Generation wRfx: NONREACTIVE

## 2017-09-13 LAB — CBC
HCT: 39.8 % (ref 35.0–47.0)
Hemoglobin: 13.5 g/dL (ref 12.0–16.0)
MCH: 31.6 pg (ref 26.0–34.0)
MCHC: 33.9 g/dL (ref 32.0–36.0)
MCV: 93.3 fL (ref 80.0–100.0)
Platelets: 189 10*3/uL (ref 150–440)
RBC: 4.26 MIL/uL (ref 3.80–5.20)
RDW: 12.7 % (ref 11.5–14.5)
WBC: 14.3 10*3/uL — ABNORMAL HIGH (ref 3.6–11.0)

## 2017-09-13 MED ORDER — ALBUTEROL SULFATE HFA 108 (90 BASE) MCG/ACT IN AERS: 1.0000 | INHALATION_SPRAY | Freq: Four times a day (QID) | RESPIRATORY_TRACT | Status: DC

## 2017-09-13 MED ORDER — ALBUTEROL SULFATE (2.5 MG/3ML) 0.083% IN NEBU
2.5000 mg | INHALATION_SOLUTION | Freq: Four times a day (QID) | RESPIRATORY_TRACT | Status: DC
  Administered 2017-09-13 (×2): 2.5 mg via RESPIRATORY_TRACT
  Filled 2017-09-13 (×2): qty 3

## 2017-09-13 MED ORDER — BUDESONIDE 0.25 MG/2ML IN SUSP
0.2500 mg | Freq: Two times a day (BID) | RESPIRATORY_TRACT | Status: DC
  Administered 2017-09-13 – 2017-09-15 (×3): 0.25 mg via RESPIRATORY_TRACT
  Filled 2017-09-13 (×4): qty 2

## 2017-09-13 MED ORDER — ONDANSETRON HCL 4 MG/2ML IJ SOLN
4.0000 mg | Freq: Four times a day (QID) | INTRAMUSCULAR | Status: DC
  Administered 2017-09-13 – 2017-09-15 (×8): 4 mg via INTRAVENOUS
  Filled 2017-09-13 (×8): qty 2

## 2017-09-13 NOTE — Progress Notes (Signed)
[facility], [place] 09/13/17  Subjective:   Patient is doing better today.  Did have an episode of headache, nausea and vomiting yesterday after lunch.  She did not try to eat much since then.  Serum creatinine has improved to 1.11 with IV hydration. No shortness of breath, no leg edema  Objective:  Vital signs in last 24 hours:  Temp:  [98.8 F (37.1 C)-99.1 F (37.3 C)] 98.8 F (37.1 C) (07/07 0455) Pulse Rate:  [65-71] 65 (07/07 0455) Resp:  [18-20] 18 (07/07 0455) BP: (115-146)/(64-77) 115/69 (07/07 0455) SpO2:  [98 %-99 %] 98 % (07/07 0455)  Weight change:  Filed Weights   09/11/17 1737  Weight: 70.1 kg (154 lb 9.6 oz)    Intake/Output:    Intake/Output Summary (Last 24 hours) at 09/13/2017 1026 Last data filed at 09/12/2017 1900 Gross per 24 hour  Intake 0 ml  Output 0 ml  Net 0 ml     Physical Exam: General:  Laying in the bed, no acute distress  HEENT  anicteric, moist oral mucous membranes  Neck  supple  Pulm/lungs  normal breathing effort, clear to auscultation bilaterally  CVS/Heart  regular rhythm, soft systolic murmur  Abdomen:   Soft, nontender  Extremities:  No peripheral edema  Neurologic:  Alert, oriented  Skin:  Normal turgor, no acute rashes          Basic Metabolic Panel:  Recent Labs  Lab 09/09/17 1427 09/11/17 1328 09/12/17 0448 09/13/17 0728  NA 133* 131* 135 136  K 4.6 4.3 4.1 4.4  CL 97* 95* 102 106  CO2 25 22 25 25   GLUCOSE 146* 163* 105* 93  BUN 36* 37* 29* 15  CREATININE 1.35* 1.87* 1.38* 1.11*  CALCIUM 8.7* 9.1 7.9* 7.9*  MG  --  2.6*  --   --      CBC: Recent Labs  Lab 09/09/17 1427 09/11/17 1328 09/12/17 0448 09/12/17 1612 09/13/17 0728  WBC 1.6* 20.6* 17.0* 18.1* 14.3*  NEUTROABS 0.8* 16.9*  --  14.6*  --   HGB 14.7 15.3 13.4 13.4 13.5  HCT 44.1 45.1 39.1 39.4 39.8  MCV 94.1 92.8 92.9 92.9 93.3  PLT 159 262 199 203 189     No results found for: HEPBSAG, HEPBSAB,  HEPBIGM    Microbiology:  Recent Results (from the past 240 hour(s))  Gastrointestinal Panel by PCR , Stool     Status: Abnormal   Collection Time: 09/09/17  2:26 PM  Result Value Ref Range Status   Campylobacter species NOT DETECTED NOT DETECTED Final   Plesimonas shigelloides NOT DETECTED NOT DETECTED Final   Salmonella species NOT DETECTED NOT DETECTED Final   Yersinia enterocolitica NOT DETECTED NOT DETECTED Final   Vibrio species NOT DETECTED NOT DETECTED Final   Vibrio cholerae NOT DETECTED NOT DETECTED Final   Enteroaggregative E coli (EAEC) NOT DETECTED NOT DETECTED Final   Enteropathogenic E coli (EPEC) DETECTED (A) NOT DETECTED Final    Comment: RESULT CALLED TO, READ BACK BY AND VERIFIED WITH: BRENDA ELLINGTON ON 09/11/17 AT 0953 QSD    Enterotoxigenic E coli (ETEC) NOT DETECTED NOT DETECTED Final   Shiga like toxin producing E coli (STEC) NOT DETECTED NOT DETECTED Final   Shigella/Enteroinvasive E coli (EIEC) NOT DETECTED NOT DETECTED Final   Cryptosporidium NOT DETECTED NOT DETECTED Final   Cyclospora cayetanensis NOT DETECTED NOT DETECTED Final   Entamoeba histolytica NOT DETECTED NOT DETECTED Final   Giardia lamblia NOT DETECTED NOT  DETECTED Final   Adenovirus F40/41 NOT DETECTED NOT DETECTED Final   Astrovirus NOT DETECTED NOT DETECTED Final   Norovirus GI/GII NOT DETECTED NOT DETECTED Final   Rotavirus A NOT DETECTED NOT DETECTED Final   Sapovirus (I, II, IV, and V) NOT DETECTED NOT DETECTED Final    Comment: Performed at [facility], [address], [place], [place] [address]  Urine Culture     Status: Abnormal   Collection Time: 09/09/17  2:26 PM  Result Value Ref Range Status   Specimen Description   Final    URINE, CLEAN CATCH Performed at [facility], [address], [place], [place] [address]    Special Requests   Final    NONE Performed at [facility], [address] [place], [place] [address]    Culture  MULTIPLE SPECIES PRESENT, SUGGEST RECOLLECTION (A)  Final   Report Status 09/11/2017 FINAL  Final  C Difficile Quick Screen w PCR reflex     Status: None   Collection Time: 09/09/17  2:27 PM  Result Value Ref Range Status   C Diff antigen NEGATIVE NEGATIVE Final   C Diff toxin NEGATIVE NEGATIVE Final   C Diff interpretation No C. difficile detected.  Final    Comment: Performed at [facility], [address], [place], [place] [address]  Culture, blood (Routine X 2) w Reflex to ID Panel     Status: None (Preliminary result)   Collection Time: 09/09/17  2:27 PM  Result Value Ref Range Status   Specimen Description   Final    BLOOD Performed at [facility] [place], [address], [place], [place] [address]    Special Requests   Final    LEFT ANTECUBITAL Performed at [facility], [address] [place], [place] [address]    Culture   Final    NO GROWTH 4 DAYS Performed at [facility], [address] [place], [place] [address]    Report Status PENDING  Incomplete  Culture, blood (Routine X 2) w Reflex to ID Panel     Status: None (Preliminary result)   Collection Time: 09/09/17  2:27 PM  Result Value Ref Range Status   Specimen Description   Final    BLOOD Performed at [facility], [address], [place], [place] [address]    Special Requests   Final    RIGHT ANTECUBITAL Performed at [facility] [place], [address], [place], [place] [address]    Culture   Final    NO GROWTH 4 DAYS Performed at [facility], [address] [place], [place] [address]    Report Status PENDING  Incomplete  Gastrointestinal Panel by PCR , Stool     Status: Abnormal   Collection Time: 09/11/17 10:08 PM  Result Value Ref Range Status   Campylobacter species NOT DETECTED NOT DETECTED Final   Plesimonas shigelloides NOT DETECTED NOT DETECTED Final   Salmonella species NOT DETECTED NOT DETECTED Final   Yersinia  enterocolitica NOT DETECTED NOT DETECTED Final   Vibrio species NOT DETECTED NOT DETECTED Final   Vibrio cholerae NOT DETECTED NOT DETECTED Final   Enteroaggregative E coli (EAEC) NOT DETECTED NOT DETECTED Final   Enteropathogenic E coli (EPEC) DETECTED (A) NOT DETECTED Final    Comment: RESULT CALLED TO, READ BACK BY AND VERIFIED WITH: JEANNE MUCESI ON 09/12/17 AT 0024 [facility]    Enterotoxigenic E coli (ETEC) NOT DETECTED NOT  DETECTED Final   Shiga like toxin producing E coli (STEC) NOT DETECTED NOT DETECTED Final   Shigella/Enteroinvasive E coli (EIEC) NOT DETECTED NOT DETECTED Final   Cryptosporidium NOT DETECTED NOT DETECTED Final   Cyclospora cayetanensis NOT DETECTED NOT DETECTED Final   Entamoeba histolytica NOT DETECTED NOT DETECTED Final   Giardia lamblia NOT DETECTED NOT DETECTED Final   Adenovirus F40/41 NOT DETECTED NOT DETECTED Final   Astrovirus NOT DETECTED NOT DETECTED Final   Norovirus GI/GII NOT DETECTED NOT DETECTED Final   Rotavirus A NOT DETECTED NOT DETECTED Final   Sapovirus (I, II, IV, and V) NOT DETECTED NOT DETECTED Final    Comment: Performed at [facility], [address] [place], [place] [address]    Coagulation Studies: No results for input(s): LABPROT, INR in the last 72 hours.  Urinalysis: No results for input(s): COLORURINE, LABSPEC, PHURINE, GLUCOSEU, HGBUR, BILIRUBINUR, KETONESUR, PROTEINUR, UROBILINOGEN, NITRITE, LEUKOCYTESUR in the last 72 hours.  Invalid input(s): APPERANCEUR    Imaging: No results found.   Medications:   . sodium chloride 75 mL/hr at 09/13/17 0432  . azithromycin 500 mg (09/12/17 1721)   . albuterol  2.5 mg Nebulization Q6H  . atenolol  50 mg Oral Daily  . budesonide (PULMICORT) nebulizer solution  0.25 mg Nebulization BID  . cholecalciferol  1,000 Units Oral Daily  . citalopram  20 mg Oral Daily  . famotidine  20 mg Oral QHS  . heparin  5,000 Units Subcutaneous Q8H  . multivitamin with minerals  1 tablet  Oral Daily  . ondansetron (ZOFRAN) IV  4 mg Intravenous Q6H  . pantoprazole  40 mg Oral Daily  . vitamin B-12  1,000 mcg Oral Daily  . vitamin C  500 mg Oral Daily   albuterol, LORazepam, ondansetron (ZOFRAN) IV  Assessment/ Plan:  67 y.o. Caucasian female with with medical problems of Stage 1A invasive ductal breast cancer treated with chemo (taxotere-cytoxan), Neulasta,, who was admitted to [facility] on 09/11/2017 for evaluation of diarrhea and is Dx with Enteropathogenic E Coli and AKI  1.  Acute renal failure.  Baseline creatinine 0.94 on September 03, 2017 2.  Acute gastroenteritis from enteropathogenic E. coli  Acute renal failure is likely secondary to volume depletion and possibly ATN from dehydration and enteropathogenic E. coli infection.  Diarrhea appears to be slowing down.  Serum creatinine has improved to 1.11. Since oral intake is not adequate yet agree with continuing IV fluids Avoid nephrotoxins including nonsteroidals, IV contrast Antibiotics for gastroenteritis as per primary team We will follow    LOS: 2 Darionna Banke Candiss Norse 7/7/201910:26 [place], [place]  Note: This note was prepared with Dragon dictation. Any transcription errors are unintentional

## 2017-09-13 NOTE — Progress Notes (Signed)
[facility] [place] NAME: Mariah Hogan    MR#:  [id number]  DATE OF BIRTH:  09/23/1950   diarrhea is less but has nausea, cough..  CHIEF COMPLAINT:  No chief complaint on file. Patient has a history of breast cancer on chemotherapy, Neulasta admitted for diarrhea, acute renal failure.  Diarrhea is resolved but has nausea and did not eat since yesterday due to nausea.  No abdominal pain but does have some cough. REVIEW OF SYSTEMS:    Review of Systems  Constitutional: Negative for chills and fever.  HENT: Negative for hearing loss.   Eyes: Negative for blurred vision, double vision and photophobia.  Respiratory: Positive for cough. Negative for hemoptysis and shortness of breath.   Cardiovascular: Negative for palpitations, orthopnea and leg swelling.  Gastrointestinal: Positive for nausea. Negative for abdominal pain, diarrhea and vomiting.  Genitourinary: Negative for dysuria and urgency.  Musculoskeletal: Negative for myalgias and neck pain.  Skin: Negative for rash.  Neurological: Negative for dizziness, focal weakness, seizures, weakness and headaches.  Psychiatric/Behavioral: Negative for memory loss. The patient does not have insomnia.     Nutrition:  Tolerating Diet: Tolerating PT:      DRUG ALLERGIES:   Allergies  Allergen Reactions  . Penicillin G Shortness Of Breath, Swelling and Rash    Has patient had a PCN reaction causing immediate rash, facial/tongue/throat swelling, SOB or lightheadedness with hypotension: Yes Has patient had a PCN reaction causing severe rash involving mucus membranes or skin necrosis: No Has patient had a PCN reaction that required hospitalization: No Has patient had a PCN reaction occurring within the last 10 years: No If all of the above answers are "NO", then may proceed with Cephalosporin use.  Marland Kitchen Fluocinonide   . Other Other (See Comments)    Dodecyl gallate: Positive patch test   Elta MD UV Clear SPF 46: Positive patch test  fragrance dodocylgallate dodocylgallate  . Parabens Other (See Comments)    Positive patch test   . Shellac   . Triamcinolone Acetonide   . Latex Rash    rash  . Triamcinolone Rash    VITALS:  Blood pressure 115/69, pulse 65, temperature 98.8 F (37.1 C), temperature source Oral, resp. rate 18, height 5' 5"  (1.651 m), weight 70.1 kg (154 lb 9.6 oz), SpO2 98 %.  PHYSICAL EXAMINATION:   Physical Exam  GENERAL:  67 y.o.-year-old patient lying in the bed with no acute distress.  EYES: Pupils equal, round, reactive to light and accommodation. No scleral icterus. Extraocular muscles intact.  HEENT: Head atraumatic, normocephalic. Oropharynx and nasopharynx clear.  NECK:  Supple, no jugular venous distention. No thyroid enlargement, no tenderness.  LUNGS: Normal breath sounds bilaterally, no wheezing, rales,rhonchi or crepitation. No use of accessory muscles of respiration.  CARDIOVASCULAR: S1, S2 normal. No murmurs, rubs, or gallops.  ABDOMEN: Soft, nontender, nondistended. Bowel sounds present. No organomegaly or mass.  EXTREMITIES: No pedal edema, cyanosis, or clubbing.  NEUROLOGIC: Cranial nerves II through XII are intact. Muscle strength 5/5 in all extremities. Sensation intact. Gait not checked.  PSYCHIATRIC: The patient is alert and oriented x 3.  SKIN: No obvious rash, lesion, or ulcer.    LABORATORY PANEL:   CBC Recent Labs  Lab 09/13/17 0728  WBC 14.3*  HGB 13.5  HCT 39.8  PLT 189   ------------------------------------------------------------------------------------------------------------------  Chemistries  Recent Labs  Lab 09/11/17 1328  09/13/17 0728  NA 131*   < > 136  K 4.3   < > 4.4  CL 95*   < > 106  CO2 22   < > 25  GLUCOSE 163*   < > 93  BUN 37*   < > 15  CREATININE 1.87*   < > 1.11*  CALCIUM 9.1   < > 7.9*  MG 2.6*  --   --   AST 24  --   --   ALT 26  --   --   ALKPHOS 112  --   --   BILITOT  0.7  --   --    < > = values in this interval not displayed.   ------------------------------------------------------------------------------------------------------------------  Cardiac Enzymes No results for input(s): TROPONINI in the last 168 hours. ------------------------------------------------------------------------------------------------------------------  RADIOLOGY:  No results found.   ASSESSMENT AND PLAN:   Principal Problem:   Acute renal failure (ARF) (HCC) Active Problems:   Diarrhea   #104.  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 Coli.   1.  Acute gastroenteritis due to enteropathogenic E. coli, because of her immunosuppressed state with chemotherapy from cancer started on azithromycin 500 mg daily for 3 days .  He is day 3..  Diarrhea resolved. Mariah Hogan has nausea, poor p.o. intake due to nausea so continue IV fluids, continue Zofran around-the-clock for today.  2.  Acute renal failure secondary to GI losses: But because of nausea and poor p.o. intake continue IV fluids at lower rate likely discharge home tomorrow if she is able to tolerate p.o.  Discussed with nephrology Dr. Murlean Iba, discussed with patient's daughter also.   3.  Leukopenia status post Neulasta, p now leukocytosis due to acute infection,.  WBC better to 14.3 from 20.6  4.  Recently diagnosed recurrent breast cancer: Follow-up with oncology as per their recommendation. Patient's daughter is gastroenterology PA. For nausea: Continue Zofran for nausea.  aCute on chronic cough: Albuterol, Pulmicort today. chest X-ray on admission unremarkable.  Patient has no wheezing on clinical exam  All the records are reviewed and case discussed with Care Management/Social Workerr. Management plans discussed with the patient, family  and they are in agreement.  CODE STATUS: Full code  TOTAL TIME TAKING CARE OF THIS PATIENT: 35 minutes   moreThan 50% time spent in counseling, coordination of care discussed with patient daughter.  POSSIBLE D/C IN 1-2 DAYS, DEPENDING ON CLINICAL CONDITION.   Mariah Hogan M.D on 09/13/2017 at 12:28 PM  Between 7am to 6pm - Pager - [phone]  After 6pm go to [email or web address] - password [email or web address] [facility]  Office  [phone]  CC: Primary care physician; Marinda Elk, MD

## 2017-09-13 NOTE — Plan of Care (Signed)
  Problem: Education: Goal: Knowledge of General Education information will improve Outcome: Progressing   Problem: Clinical Measurements: Goal: Ability to maintain clinical measurements within normal limits will improve Outcome: Progressing   Problem: Nutrition: Goal: Adequate nutrition will be maintained Outcome: Progressing   Problem: Elimination: Goal: Will not experience complications related to bowel motility Outcome: Progressing

## 2017-09-14 ENCOUNTER — Inpatient Hospital Stay: Payer: Medicare Other | Admitting: Hematology and Oncology

## 2017-09-14 ENCOUNTER — Inpatient Hospital Stay: Payer: Medicare Other

## 2017-09-14 LAB — CBC
HCT: 39.5 % (ref 35.0–47.0)
Hemoglobin: 13.2 g/dL (ref 12.0–16.0)
MCH: 31.5 pg (ref 26.0–34.0)
MCHC: 33.6 g/dL (ref 32.0–36.0)
MCV: 93.9 fL (ref 80.0–100.0)
Platelets: 183 10*3/uL (ref 150–440)
RBC: 4.2 MIL/uL (ref 3.80–5.20)
RDW: 12.7 % (ref 11.5–14.5)
WBC: 12.5 10*3/uL — ABNORMAL HIGH (ref 3.6–11.0)

## 2017-09-14 LAB — BASIC METABOLIC PANEL
Anion gap: 7 (ref 5–15)
BUN: 11 mg/dL (ref 8–23)
CO2: 27 mmol/L (ref 22–32)
Calcium: 8.2 mg/dL — ABNORMAL LOW (ref 8.9–10.3)
Chloride: 103 mmol/L (ref 98–111)
Creatinine, Ser: 1.01 mg/dL — ABNORMAL HIGH (ref 0.44–1.00)
GFR calc Af Amer: >60 mL/min (ref >60)
GFR calc non Af Amer: 55 mL/min — ABNORMAL LOW (ref >60)
Glucose, Bld: 144 mg/dL — ABNORMAL HIGH (ref 70–99)
Potassium: 3.8 mmol/L (ref 3.5–5.1)
Sodium: 137 mmol/L (ref 135–145)

## 2017-09-14 LAB — CULTURE, BLOOD (ROUTINE X 2)
Culture: NO GROWTH
Culture: NO GROWTH

## 2017-09-14 MED ORDER — TRIAMTERENE-HCTZ 37.5-25 MG PO TABS
1.0000 | ORAL_TABLET | Freq: Every day | ORAL | Status: DC
  Administered 2017-09-14 – 2017-09-15 (×2): 1 via ORAL
  Filled 2017-09-14 (×3): qty 1

## 2017-09-14 MED ORDER — TRIAMTERENE-HCTZ 37.5-25 MG PO TABS
1.0000 | ORAL_TABLET | Freq: Every day | ORAL | Status: DC
  Administered 2017-09-14: 1 via ORAL
  Filled 2017-09-14: qty 1

## 2017-09-14 MED ORDER — ALBUTEROL SULFATE (2.5 MG/3ML) 0.083% IN NEBU
2.5000 mg | INHALATION_SOLUTION | Freq: Two times a day (BID) | RESPIRATORY_TRACT | Status: DC
  Administered 2017-09-14 – 2017-09-15 (×2): 2.5 mg via RESPIRATORY_TRACT
  Filled 2017-09-14 (×3): qty 3

## 2017-09-14 NOTE — Progress Notes (Signed)
[facility] [place] NAME: Mariah Hogan    MR#:  [id number]  DATE OF BIRTH:  May 03, 1950   dpatient had 6 episodes of diarrhea since yesterday afternoon, wants to stay one more night just to make sure she does not get recurrent kidney failure.  Nausea but does have some cough.  CHIEF COMPLAINT:  No chief complaint on file. Patient has a history of breast cancer on chemotherapy, Neulasta admitted for diarrhea, acute renal failure.   patient diarrhea started back  REVIEW OF SYSTEMS:    Review of Systems  Constitutional: Negative for chills and fever.  HENT: Negative for hearing loss.   Eyes: Negative for blurred vision, double vision and photophobia.  Respiratory: Positive for cough. Negative for hemoptysis and shortness of breath.   Cardiovascular: Negative for palpitations, orthopnea and leg swelling.  Gastrointestinal: Positive for nausea. Negative for abdominal pain, diarrhea and vomiting.  Genitourinary: Negative for dysuria and urgency.  Musculoskeletal: Negative for myalgias and neck pain.  Skin: Negative for rash.  Neurological: Negative for dizziness, focal weakness, seizures, weakness and headaches.  Psychiatric/Behavioral: Negative for memory loss. The patient does not have insomnia.     Nutrition:  Tolerating Diet: Tolerating PT:      DRUG ALLERGIES:   Allergies  Allergen Reactions  . Penicillin G Shortness Of Breath, Swelling and Rash    Has patient had a PCN reaction causing immediate rash, facial/tongue/throat swelling, SOB or lightheadedness with hypotension: Yes Has patient had a PCN reaction causing severe rash involving mucus membranes or skin necrosis: No Has patient had a PCN reaction that required hospitalization: No Has patient had a PCN reaction occurring within the last 10 years: No If all of the above answers are "NO", then may proceed with Cephalosporin use.  Marland Kitchen Fluocinonide   . Other Other (See  Comments)    Dodecyl gallate: Positive patch test  Elta MD UV Clear SPF 46: Positive patch test  fragrance dodocylgallate dodocylgallate  . Parabens Other (See Comments)    Positive patch test   . Shellac   . Triamcinolone Acetonide   . Latex Rash    rash  . Triamcinolone Rash    VITALS:  Blood pressure (!) 141/74, pulse 88, temperature 98.7 F (37.1 C), temperature source Oral, resp. rate 20, height 5' 5"  (1.651 m), weight 70.1 kg (154 lb 9.6 oz), SpO2 96 %.  PHYSICAL EXAMINATION:   Physical Exam  GENERAL:  67 y.o.-year-old patient lying in the bed with no acute distress.  EYES: Pupils equal, round, reactive to light and accommodation. No scleral icterus. Extraocular muscles intact.  HEENT: Head atraumatic, normocephalic. Oropharynx and nasopharynx clear.  NECK:  Supple, no jugular venous distention. No thyroid enlargement, no tenderness.  LUNGS: Normal breath sounds bilaterally, no wheezing, rales,rhonchi or crepitation. No use of accessory muscles of respiration.  CARDIOVASCULAR: S1, S2 normal. No murmurs, rubs, or gallops.  ABDOMEN: Soft, nontender, nondistended. Bowel sounds present. No organomegaly or mass.  EXTREMITIES: No pedal edema, cyanosis, or clubbing.  NEUROLOGIC: Cranial nerves II through XII are intact. Muscle strength 5/5 in all extremities. Sensation intact. Gait not checked.  PSYCHIATRIC: The patient is alert and oriented x 3.  SKIN: No obvious rash, lesion, or ulcer.    LABORATORY PANEL:   CBC Recent Labs  Lab 09/14/17 1010  WBC 12.5*  HGB 13.2  HCT 39.5  PLT 183   ------------------------------------------------------------------------------------------------------------------  Chemistries  Recent Labs  Lab 09/11/17 1328  09/14/17 1010  NA 131*   < > 137  K 4.3   < > 3.8  CL 95*   < > 103  CO2 22   < > 27  GLUCOSE 163*   < > 144*  BUN 37*   < > 11  CREATININE 1.87*   < > 1.01*  CALCIUM 9.1   < > 8.2*  MG 2.6*  --   --   AST 24  --    --   ALT 26  --   --   ALKPHOS 112  --   --   BILITOT 0.7  --   --    < > = values in this interval not displayed.   ------------------------------------------------------------------------------------------------------------------  Cardiac Enzymes No results for input(s): TROPONINI in the last 168 hours. ------------------------------------------------------------------------------------------------------------------  RADIOLOGY:  No results found.   ASSESSMENT AND PLAN:   Principal Problem:   Acute renal failure (ARF) (HCC) Active Problems:   Diarrhea   #43.  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 Coli.   1.  Acute gastroenteritis due to enteropathogenic E. coli, because of her immunosuppressed state with chemotherapy from cancer started on azithromycin 500 mg daily for 3 days and finished azithromycin.    Patient diarrhea started back and she is concerned so continue to monitor closely to today, likely discharge tomorrow.  Patient repeat renal labs, WBC are reassuring.  However patient is worried that she may get recurrent kidney injury due to persistent diarrhea . 2.  Acute renal failure secondary to GI losses: Improved with IV fluids  3.  Leukopenia status post Neulasta, p now leukocytosis due to acute infection,.  WBC better from 14.3-12.5  4.  Recently diagnosed recurrent breast cancer: Follow-up with oncology as per their recommendation. Patient's daughter is gastroenterology PA. For nausea: Continue Zofran for nausea.  aCute on chronic cough: Likely secondary to allergies.  Continue to albuterol, Pulmicort, give Rocephin IV today and tomorrow atypical bronchitis. All the records are reviewed and case discussed with Care Management/Social Workerr. Management plans discussed with the patient, family and  they are in agreement.  CODE STATUS: Full code  TOTAL TIME TAKING CARE OF THIS PATIENT: 35 minutes   moreThan 50% time spent in counseling, coordination of care discussed with patient daughter.  POSSIBLE D/C IN 1-2 DAYS, DEPENDING ON CLINICAL CONDITION.   Mariah Hogan M.D on 09/14/2017 at 11:41 AM  Between 7am to 6pm - Pager - [phone]  After 6pm go to [email or web address] - password [email or web address]  Office  [phone]  CC: Primary care physician; Marinda Elk, MD

## 2017-09-14 NOTE — Care Management Important Message (Signed)
Important Message  Patient Details  Name: Mariah Hogan MRN: [id number] Date of Birth: 07-19-1950   Medicare Important Message Given:  Yes    Kathyrn Drown Akim Watkinson, RN 09/14/2017, 10:53 AM

## 2017-09-14 NOTE — Progress Notes (Signed)
[facility], [place] 09/14/17  Subjective:  Overall patient appears to be doing better at the moment. Most recent creatinine was 1.1. No new creatinine today.  Objective:  Vital signs in last 24 hours:  Temp:  [98.2 F (36.8 C)-98.7 F (37.1 C)] 98.7 F (37.1 C) (07/08 0812) Pulse Rate:  [67-88] 88 (07/08 0812) Resp:  [17-24] 20 (07/08 0812) BP: (129-152)/(68-78) 141/74 (07/08 0812) SpO2:  [96 %-100 %] 96 % (07/08 0812)  Weight change:  Filed Weights   09/11/17 1737  Weight: 70.1 kg (154 lb 9.6 oz)    Intake/Output:   No intake or output data in the 24 hours ending 09/14/17 1041   Physical Exam: General:  Laying in the bed, no acute distress  HEENT  anicteric, moist oral mucous membranes  Neck  supple  Pulm/lungs  normal breathing effort, clear to auscultation bilaterally  CVS/Heart  regular rhythm, soft systolic murmur  Abdomen:   Soft, nontender  Extremities:  No peripheral edema  Neurologic:  Alert, oriented  Skin:  Normal turgor, no acute rashes          Basic Metabolic Panel:  Recent Labs  Lab 09/09/17 1427 09/11/17 1328 09/12/17 0448 09/13/17 0728  NA 133* 131* 135 136  K 4.6 4.3 4.1 4.4  CL 97* 95* 102 106  CO2 25 22 25 25   GLUCOSE 146* 163* 105* 93  BUN 36* 37* 29* 15  CREATININE 1.35* 1.87* 1.38* 1.11*  CALCIUM 8.7* 9.1 7.9* 7.9*  MG  --  2.6*  --   --      CBC: Recent Labs  Lab 09/09/17 1427 09/11/17 1328 09/12/17 0448 09/12/17 1612 09/13/17 0728 09/14/17 1010  WBC 1.6* 20.6* 17.0* 18.1* 14.3* 12.5*  NEUTROABS 0.8* 16.9*  --  14.6*  --   --   HGB 14.7 15.3 13.4 13.4 13.5 13.2  HCT 44.1 45.1 39.1 39.4 39.8 39.5  MCV 94.1 92.8 92.9 92.9 93.3 93.9  PLT 159 262 199 203 189 183     No results found for: HEPBSAG, HEPBSAB, HEPBIGM    Microbiology:  Recent Results (from the past 240 hour(s))  Gastrointestinal Panel by PCR , Stool     Status: Abnormal   Collection Time: 09/09/17  2:26 PM  Result  Value Ref Range Status   Campylobacter species NOT DETECTED NOT DETECTED Final   Plesimonas shigelloides NOT DETECTED NOT DETECTED Final   Salmonella species NOT DETECTED NOT DETECTED Final   Yersinia enterocolitica NOT DETECTED NOT DETECTED Final   Vibrio species NOT DETECTED NOT DETECTED Final   Vibrio cholerae NOT DETECTED NOT DETECTED Final   Enteroaggregative E coli (EAEC) NOT DETECTED NOT DETECTED Final   Enteropathogenic E coli (EPEC) DETECTED (A) NOT DETECTED Final    Comment: RESULT CALLED TO, READ BACK BY AND VERIFIED WITH: BRENDA ELLINGTON ON 09/11/17 AT 0953 QSD    Enterotoxigenic E coli (ETEC) NOT DETECTED NOT DETECTED Final   Shiga like toxin producing E coli (STEC) NOT DETECTED NOT DETECTED Final   Shigella/Enteroinvasive E coli (EIEC) NOT DETECTED NOT DETECTED Final   Cryptosporidium NOT DETECTED NOT DETECTED Final   Cyclospora cayetanensis NOT DETECTED NOT DETECTED Final   Entamoeba histolytica NOT DETECTED NOT DETECTED Final   Giardia lamblia NOT DETECTED NOT DETECTED Final   Adenovirus F40/41 NOT DETECTED NOT DETECTED Final   Astrovirus NOT DETECTED NOT DETECTED Final   Norovirus GI/GII NOT DETECTED NOT DETECTED Final   Rotavirus A NOT DETECTED NOT DETECTED Final  Sapovirus (I, II, IV, and V) NOT DETECTED NOT DETECTED Final    Comment: Performed at [facility], [address] [place], [place] [address]  Urine Culture     Status: Abnormal   Collection Time: 09/09/17  2:26 PM  Result Value Ref Range Status   Specimen Description   Final    URINE, CLEAN CATCH Performed at [facility], [address], [place], [place] [address]    Special Requests   Final    NONE Performed at [facility], [address], [place], [place] [address]    Culture MULTIPLE SPECIES PRESENT, SUGGEST RECOLLECTION (A)  Final   Report Status 09/11/2017 FINAL  Final  C Difficile Quick Screen w PCR reflex     Status: None   Collection Time: 09/09/17   2:27 PM  Result Value Ref Range Status   C Diff antigen NEGATIVE NEGATIVE Final   C Diff toxin NEGATIVE NEGATIVE Final   C Diff interpretation No C. difficile detected.  Final    Comment: Performed at [facility], [address], [place], [place] [address]  Culture, blood (Routine X 2) w Reflex to ID Panel     Status: None   Collection Time: 09/09/17  2:27 PM  Result Value Ref Range Status   Specimen Description   Final    BLOOD Performed at [facility], [address] [place], [place] [address]    Special Requests   Final    LEFT ANTECUBITAL Performed at [facility], [address] [place], [place] [address]    Culture   Final    NO GROWTH 5 DAYS Performed at [facility], [address] [place], [place] [address]    Report Status 09/14/2017 FINAL  Final  Culture, blood (Routine X 2) w Reflex to ID Panel     Status: None   Collection Time: 09/09/17  2:27 PM  Result Value Ref Range Status   Specimen Description   Final    BLOOD Performed at [facility], [address] [place], [place] [address]    Special Requests   Final    RIGHT ANTECUBITAL Performed at [facility] [place], [address] [place], [place] [address]    Culture   Final    NO GROWTH 5 DAYS Performed at [facility], [address] [place], [place] [address]    Report Status 09/14/2017 FINAL  Final  Gastrointestinal Panel by PCR , Stool     Status: Abnormal   Collection Time: 09/11/17 10:08 PM  Result Value Ref Range Status   Campylobacter species NOT DETECTED NOT DETECTED Final   Plesimonas shigelloides NOT DETECTED NOT DETECTED Final   Salmonella species NOT DETECTED NOT DETECTED Final   Yersinia enterocolitica NOT DETECTED NOT DETECTED Final   Vibrio species NOT DETECTED NOT DETECTED Final   Vibrio cholerae NOT DETECTED NOT DETECTED Final   Enteroaggregative E coli (EAEC) NOT DETECTED NOT DETECTED Final    Enteropathogenic E coli (EPEC) DETECTED (A) NOT DETECTED Final    Comment: RESULT CALLED TO, READ BACK BY AND VERIFIED WITH: JEANNE MUCESI ON 09/12/17 AT 0024 [facility]    Enterotoxigenic E coli (ETEC) NOT DETECTED NOT DETECTED Final   Shiga like toxin producing E coli (STEC) NOT DETECTED NOT DETECTED Final   Shigella/Enteroinvasive E coli (EIEC) NOT DETECTED NOT DETECTED Final   Cryptosporidium NOT DETECTED NOT DETECTED Final   Cyclospora cayetanensis NOT DETECTED  NOT DETECTED Final   Entamoeba histolytica NOT DETECTED NOT DETECTED Final   Giardia lamblia NOT DETECTED NOT DETECTED Final   Adenovirus F40/41 NOT DETECTED NOT DETECTED Final   Astrovirus NOT DETECTED NOT DETECTED Final   Norovirus GI/GII NOT DETECTED NOT DETECTED Final   Rotavirus A NOT DETECTED NOT DETECTED Final   Sapovirus (I, II, IV, and V) NOT DETECTED NOT DETECTED Final    Comment: Performed at [facility], [address] [place], [place] [address]    Coagulation Studies: No results for input(s): LABPROT, INR in the last 72 hours.  Urinalysis: No results for input(s): COLORURINE, LABSPEC, PHURINE, GLUCOSEU, HGBUR, BILIRUBINUR, KETONESUR, PROTEINUR, UROBILINOGEN, NITRITE, LEUKOCYTESUR in the last 72 hours.  Invalid input(s): APPERANCEUR    Imaging: No results found.   Medications:   . sodium chloride 75 mL/hr at 09/14/17 0807   . albuterol  2.5 mg Nebulization BID  . atenolol  50 mg Oral Daily  . budesonide (PULMICORT) nebulizer solution  0.25 mg Nebulization BID  . cholecalciferol  1,000 Units Oral Daily  . citalopram  20 mg Oral Daily  . famotidine  20 mg Oral QHS  . heparin  5,000 Units Subcutaneous Q8H  . multivitamin with minerals  1 tablet Oral Daily  . ondansetron (ZOFRAN) IV  4 mg Intravenous Q6H  . pantoprazole  40 mg Oral Daily  . vitamin B-12  1,000 mcg Oral Daily  . vitamin C  500 mg Oral Daily   albuterol, LORazepam, ondansetron (ZOFRAN) IV  Assessment/ Plan:  67 y.o. Caucasian  female with with medical problems of Stage 1A invasive ductal breast cancer treated with chemo (taxotere-cytoxan), Neulasta,, who was admitted to [facility] on 09/11/2017 for evaluation of diarrhea and is Dx with Enteropathogenic E Coli and AKI  1.  Acute renal failure.  Baseline creatinine 0.94 on September 03, 2017 2.  Acute gastroenteritis from enteropathogenic E. coli  -Renal function has been improving since admission.  Creatinine yesterday was 1.1.  We recommend repeat renal function testing today.  Yesterday's creatinine was close to baseline.  Avoid nephrotoxins in the recovery period.      LOS: 3 Yareth Macdonnell 7/8/201910:41 AM  [place], [place]  Note: This note was prepared with Dragon dictation. Any transcription errors are unintentional

## 2017-09-14 NOTE — Plan of Care (Signed)
  Problem: Education: Goal: Knowledge of General Education information will improve Outcome: Progressing   Problem: Clinical Measurements: Goal: Ability to maintain clinical measurements within normal limits will improve Outcome: Progressing   Problem: Nutrition: Goal: Adequate nutrition will be maintained Outcome: Progressing   Problem: Elimination: Goal: Will not experience complications related to bowel motility Outcome: Progressing

## 2017-09-14 NOTE — Plan of Care (Signed)
Poss discharge today. Labs trending down. Problem: Education: Goal: Knowledge of General Education information will improve Outcome: Progressing   Problem: Clinical Measurements: Goal: Ability to maintain clinical measurements within normal limits will improve Outcome: Progressing   Problem: Nutrition: Goal: Adequate nutrition will be maintained Outcome: Progressing   Problem: Elimination: Goal: Will not experience complications related to bowel motility Outcome: Progressing

## 2017-09-15 ENCOUNTER — Other Ambulatory Visit: Payer: Self-pay | Admitting: Hematology and Oncology

## 2017-09-15 MED ORDER — ALBUTEROL SULFATE HFA 108 (90 BASE) MCG/ACT IN AERS: 2.0000 | INHALATION_SPRAY | Freq: Four times a day (QID) | RESPIRATORY_TRACT | 1 refills | Status: DC | PRN

## 2017-09-15 NOTE — Progress Notes (Signed)
[facility], [place] 09/15/17  Subjective:  Patient resting comfortably in bed. No new creatinine today.   Objective:  Vital signs in last 24 hours:  Temp:  [98.4 F (36.9 C)-99.2 F (37.3 C)] 98.4 F (36.9 C) (07/09 0837) Pulse Rate:  [68-86] 86 (07/09 0837) Resp:  [20] 20 (07/09 0837) BP: (121-139)/(58-76) 124/71 (07/09 0837) SpO2:  [97 %-100 %] 98 % (07/09 0837)  Weight change:  Filed Weights   09/11/17 1737  Weight: 70.1 kg (154 lb 9.6 oz)    Intake/Output:    Intake/Output Summary (Last 24 hours) at 09/15/2017 1115 Last data filed at 09/14/2017 1340 Gross per 24 hour  Intake 2939 ml  Output -  Net 2939 ml     Physical Exam: General:  Laying in the bed, no acute distress  HEENT  anicteric, moist oral mucous membranes  Neck  supple  Pulm/lungs  normal breathing effort, clear to auscultation bilaterally  CVS/Heart  regular rhythm, soft systolic murmur  Abdomen:   Soft, nontender  Extremities:  No peripheral edema  Neurologic:  Alert, oriented  Skin:  Normal turgor, no acute rashes          Basic Metabolic Panel:  Recent Labs  Lab 09/09/17 1427 09/11/17 1328 09/12/17 0448 09/13/17 0728 09/14/17 1010  NA 133* 131* 135 136 137  K 4.6 4.3 4.1 4.4 3.8  CL 97* 95* 102 106 103  CO2 25 22 25 25 27   GLUCOSE 146* 163* 105* 93 144*  BUN 36* 37* 29* 15 11  CREATININE 1.35* 1.87* 1.38* 1.11* 1.01*  CALCIUM 8.7* 9.1 7.9* 7.9* 8.2*  MG  --  2.6*  --   --   --      CBC: Recent Labs  Lab 09/09/17 1427 09/11/17 1328 09/12/17 0448 09/12/17 1612 09/13/17 0728 09/14/17 1010  WBC 1.6* 20.6* 17.0* 18.1* 14.3* 12.5*  NEUTROABS 0.8* 16.9*  --  14.6*  --   --   HGB 14.7 15.3 13.4 13.4 13.5 13.2  HCT 44.1 45.1 39.1 39.4 39.8 39.5  MCV 94.1 92.8 92.9 92.9 93.3 93.9  PLT 159 262 199 203 189 183     No results found for: HEPBSAG, HEPBSAB, HEPBIGM    Microbiology:  Recent Results (from the past 240 hour(s))  Gastrointestinal  Panel by PCR , Stool     Status: Abnormal   Collection Time: 09/09/17  2:26 PM  Result Value Ref Range Status   Campylobacter species NOT DETECTED NOT DETECTED Final   Plesimonas shigelloides NOT DETECTED NOT DETECTED Final   Salmonella species NOT DETECTED NOT DETECTED Final   Yersinia enterocolitica NOT DETECTED NOT DETECTED Final   Vibrio species NOT DETECTED NOT DETECTED Final   Vibrio cholerae NOT DETECTED NOT DETECTED Final   Enteroaggregative E coli (EAEC) NOT DETECTED NOT DETECTED Final   Enteropathogenic E coli (EPEC) DETECTED (A) NOT DETECTED Final    Comment: RESULT CALLED TO, READ BACK BY AND VERIFIED WITH: BRENDA ELLINGTON ON 09/11/17 AT 0953 QSD    Enterotoxigenic E coli (ETEC) NOT DETECTED NOT DETECTED Final   Shiga like toxin producing E coli (STEC) NOT DETECTED NOT DETECTED Final   Shigella/Enteroinvasive E coli (EIEC) NOT DETECTED NOT DETECTED Final   Cryptosporidium NOT DETECTED NOT DETECTED Final   Cyclospora cayetanensis NOT DETECTED NOT DETECTED Final   Entamoeba histolytica NOT DETECTED NOT DETECTED Final   Giardia lamblia NOT DETECTED NOT DETECTED Final   Adenovirus F40/41 NOT DETECTED NOT DETECTED Final   Astrovirus  NOT DETECTED NOT DETECTED Final   Norovirus GI/GII NOT DETECTED NOT DETECTED Final   Rotavirus A NOT DETECTED NOT DETECTED Final   Sapovirus (I, II, IV, and V) NOT DETECTED NOT DETECTED Final    Comment: Performed at [facility], [address], [place], [place] [address]  Urine Culture     Status: Abnormal   Collection Time: 09/09/17  2:26 PM  Result Value Ref Range Status   Specimen Description   Final    URINE, CLEAN CATCH Performed at [facility], [address], [place], [place] [address]    Special Requests   Final    NONE Performed at [facility], [address] [place], [place] [address]    Culture MULTIPLE SPECIES PRESENT, SUGGEST RECOLLECTION (A)  Final   Report Status 09/11/2017 FINAL  Final   C Difficile Quick Screen w PCR reflex     Status: None   Collection Time: 09/09/17  2:27 PM  Result Value Ref Range Status   C Diff antigen NEGATIVE NEGATIVE Final   C Diff toxin NEGATIVE NEGATIVE Final   C Diff interpretation No C. difficile detected.  Final    Comment: Performed at [facility], [address], [place], [place] [address]  Culture, blood (Routine X 2) w Reflex to ID Panel     Status: None   Collection Time: 09/09/17  2:27 PM  Result Value Ref Range Status   Specimen Description   Final    BLOOD Performed at [facility] [place], [address], [place], [place] [address]    Special Requests   Final    LEFT ANTECUBITAL Performed at [facility], [address] [place], [place] [address]    Culture   Final    NO GROWTH 5 DAYS Performed at [facility], [address] [place], [place] [address]    Report Status 09/14/2017 FINAL  Final  Culture, blood (Routine X 2) w Reflex to ID Panel     Status: None   Collection Time: 09/09/17  2:27 PM  Result Value Ref Range Status   Specimen Description   Final    BLOOD Performed at [facility] [place], [address], [place], [place] [address]    Special Requests   Final    RIGHT ANTECUBITAL Performed at [facility] [place], [address] [place], [place] [address]    Culture   Final    NO GROWTH 5 DAYS Performed at [facility], [place] [place],  [address]    Report Status 09/14/2017 FINAL  Final  Gastrointestinal Panel by PCR , Stool     Status: Abnormal   Collection Time: 09/11/17 10:08 PM  Result Value Ref Range Status   Campylobacter species NOT DETECTED NOT DETECTED Final   Plesimonas shigelloides NOT DETECTED NOT DETECTED Final   Salmonella species NOT DETECTED NOT DETECTED Final   Yersinia enterocolitica NOT DETECTED NOT DETECTED Final   Vibrio species NOT DETECTED NOT DETECTED Final   Vibrio cholerae NOT DETECTED NOT  DETECTED Final   Enteroaggregative E coli (EAEC) NOT DETECTED NOT DETECTED Final   Enteropathogenic E coli (EPEC) DETECTED (A) NOT DETECTED Final    Comment: RESULT CALLED TO, READ BACK BY AND VERIFIED WITH: JEANNE MUCESI ON 09/12/17 AT 0024 [facility]    Enterotoxigenic E coli (ETEC) NOT DETECTED NOT DETECTED Final   Shiga like toxin producing E coli (STEC) NOT DETECTED NOT DETECTED Final  Shigella/Enteroinvasive E coli (EIEC) NOT DETECTED NOT DETECTED Final   Cryptosporidium NOT DETECTED NOT DETECTED Final   Cyclospora cayetanensis NOT DETECTED NOT DETECTED Final   Entamoeba histolytica NOT DETECTED NOT DETECTED Final   Giardia lamblia NOT DETECTED NOT DETECTED Final   Adenovirus F40/41 NOT DETECTED NOT DETECTED Final   Astrovirus NOT DETECTED NOT DETECTED Final   Norovirus GI/GII NOT DETECTED NOT DETECTED Final   Rotavirus A NOT DETECTED NOT DETECTED Final   Sapovirus (I, II, IV, and V) NOT DETECTED NOT DETECTED Final    Comment: Performed at [facility], [address] [place], [place] [address]    Coagulation Studies: No results for input(s): LABPROT, INR in the last 72 hours.  Urinalysis: No results for input(s): COLORURINE, LABSPEC, PHURINE, GLUCOSEU, HGBUR, BILIRUBINUR, KETONESUR, PROTEINUR, UROBILINOGEN, NITRITE, LEUKOCYTESUR in the last 72 hours.  Invalid input(s): APPERANCEUR    Imaging: No results found.   Medications:    . albuterol  2.5 mg Nebulization BID  . atenolol  50 mg Oral Daily  . budesonide (PULMICORT) nebulizer solution  0.25 mg Nebulization BID  . cholecalciferol  1,000 Units Oral Daily  . citalopram  20 mg Oral Daily  . famotidine  20 mg Oral QHS  . heparin  5,000 Units Subcutaneous Q8H  . multivitamin with minerals  1 tablet Oral Daily  . ondansetron (ZOFRAN) IV  4 mg Intravenous Q6H  . pantoprazole  40 mg Oral Daily  . triamterene-hydrochlorothiazide  1 tablet Oral Daily  . vitamin B-12  1,000 mcg Oral Daily  . vitamin C  500 mg Oral  Daily   albuterol, LORazepam, ondansetron (ZOFRAN) IV  Assessment/ Plan:  67 y.o. Caucasian female with with medical problems of Stage 1A invasive ductal breast cancer treated with chemo (taxotere-cytoxan), Neulasta,, who was admitted to [facility] on 09/11/2017 for evaluation of diarrhea and is Dx with Enteropathogenic E Coli and AKI  1.  Acute renal failure.  Baseline creatinine 0.94 on September 03, 2017 2.  Acute gastroenteritis from enteropathogenic E. coli  -we advised the patient to maintain adequate fluid hydration status once she returns home.  We also device her to avoid NSAIDs including BC powder, or Goody powder, Aleve, Advil, ibuprofen, and naproxen.  She verbalized understanding of this.  She will need continued monitoring of her renal function as an outpatient.  She is to reinitiate chemotherapy as per Dr. Mike Gip.  Thanks for allowing [place] to participate.        LOS: 4 Caty Tessler 7/9/201911:15 AM  [place], [place]  Note: This note was prepared with Dragon dictation. Any transcription errors are unintentional

## 2017-09-15 NOTE — Discharge Summary (Signed)
Mariah Hogan, is a 67 y.o. female  DOB 25-Aug-1950  MRN [id number].  Admission date:  09/11/2017  Admitting Physician  Vaughan Basta, MD  Discharge Date:  09/15/2017   Primary MD  Marinda Elk, MD  Recommendations for primary care physician for things to follow:   Follow with PCP in 1 week Follow-up with the oncologist Dr. Mike Gip: As scheduled for her chemotherapy.   Admission Diagnosis  ecoli breast ca   Discharge Diagnosis  ecoli breast ca    Principal Problem:   Acute renal failure (ARF) ([place]) Active Problems:   Diarrhea      Past Medical History:  Diagnosis Date  . Arthritis   . Asthma   . Breast cancer ([place]) 08/04/2017   left breast INVASIVE DUCTAL CARCINOMA.  . Cancer ([place])    [phone] basal cell carcinoma  . GERD (gastroesophageal reflux disease)   . Hyperlipidemia   . Hypertension     Past Surgical History:  Procedure Laterality Date  . ABDOMINAL HYSTERECTOMY  1998  . bladder tack  [phone]  . BREAST BIOPSY Bilateral   . BREAST BIOPSY Left 1990  . BREAST BIOPSY Left 2018   core bx- neg  . 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  Marland Kitchen BREAST CYST EXCISION  x3  . BREAST SURGERY Left 02/13/14   Fibrocystic changes, pseudo-angiomatous stromal hyperplasia. No atypia or malignancy.  . CARPAL TUNNEL RELEASE  x2  . CHOLECYSTECTOMY  2008  . COLONOSCOPY WITH PROPOFOL N/A 01/12/2015   Procedure: COLONOSCOPY WITH PROPOFOL;  Surgeon: Manya Silvas, MD;  Location: [facility] ENDOSCOPY;  Service: Endoscopy;  Laterality: N/A;  . ESOPHAGOGASTRODUODENOSCOPY (EGD) WITH PROPOFOL N/A 01/12/2015   Procedure: ESOPHAGOGASTRODUODENOSCOPY (EGD) WITH PROPOFOL;  Surgeon: Manya Silvas, MD;  Location: [facility] ENDOSCOPY;  Service: Endoscopy;  Laterality: N/A;  . NASAL  RECONSTRUCTION  1983  . SAVORY DILATION N/A 01/12/2015   Procedure: SAVORY DILATION;  Surgeon: Manya Silvas, MD;  Location[facility] ENDOSCOPY;  Service: Endoscopy;  Laterality: N/A;  . TONSILLECTOMY AND ADENOIDECTOMY  1956       History of present illness and  Hospital Course:     Kindly see H&P for history of present illness and admission details, please review complete Labs, Consult reports and Test reports for all details in brief  HPI  from the history and physical done on the day of admission 67 year old female patient with stage Ia left breast cancer status post biopsy, on chemotherapy comes in because of acute diarrhea associated with nausea, sent from oncology clinic because of persistent diarrhea, found to have acute renal failure.   Hospital Course  #1 acute gastroenteritis with acute diarrhea, nausea the patient stool C. difficile has been negative but stool culture showed EPEC, patient has enteropathogenic E. coli diarrhea, patient received IV azithromycin 500 mg daily for 3 days.  Patient diarrhea is less now.  Patient received Zofran for nausea.  She has less nausea, less diarrhea and eager to go home.  Patient had initial white count of 20.6 due to active infection but decreased to 12.5.  2.  Acute renal failure secondary to acute diarrhea, seen by nephrology, patient received aggressive hydration, patient creatinine decreased from 1.87 to 1.01. Her BP medicine Maxide on admission secondary to acute renal failure but now resumed.,  Patient Mobic also is held due to acute renal failure.  3.  Cough likely due to allergies: Patient chest x-ray has been negative.  Patient is  given albuterol prescription, she also received Pulmicort nebs in the hospital.  Her cough is less and requesting albuterol inhaler. 4 essential hypertension: Controlled, continue her home dose BP medicine namely beta-blockers, Maxide. Spoke with patient's daughter who is a GI PA.  Discharge Condition:  Stable  Follow UP      Discharge Instructions  and  Discharge Medications     Allergies as of 09/15/2017      Reactions   Penicillin G Shortness Of Breath, Swelling, Rash   Has patient had a PCN reaction causing immediate rash, facial/tongue/throat swelling, SOB or lightheadedness with hypotension: Yes Has patient had a PCN reaction causing severe rash involving mucus membranes or skin necrosis: No Has patient had a PCN reaction that required hospitalization: No Has patient had a PCN reaction occurring within the last 10 years: No If all of the above answers are "NO", then may proceed with Cephalosporin use.   Fluocinonide    Other Other (See Comments)   Dodecyl gallate: Positive patch test  Elta MD UV Clear SPF 46: Positive patch test  fragrance dodocylgallate dodocylgallate   Parabens Other (See Comments)   Positive patch test    Shellac    Triamcinolone Acetonide    Latex Rash   rash   Triamcinolone Rash      Medication List    STOP taking these medications   meloxicam 15 MG tablet Commonly known as:  MOBIC     TAKE these medications   albuterol 108 (90 Base) MCG/ACT inhaler Commonly known as:  PROVENTIL HFA;VENTOLIN HFA Inhale 2 puffs into the lungs every 6 (six) hours as needed for wheezing or shortness of breath.   atenolol 50 MG tablet Commonly known as:  TENORMIN Take 50 mg by mouth daily.   Cholecalciferol 1000 units tablet Take 1,000 Units by mouth daily.   citalopram 20 MG tablet Commonly known as:  CELEXA Take 20 mg by mouth daily.   dexamethasone 4 MG tablet Commonly known as:  DECADRON Take 2 tablets (8 mg total) by mouth 2 (two) times daily. Start the day before Taxotere. Then again the day after chemo for 3 days.   loratadine 10 MG tablet Commonly known as:  CLARITIN Take 10 mg by mouth at bedtime.   LORazepam 0.5 MG tablet Commonly known as:  ATIVAN Take 1 tablet (0.5 mg total) by mouth every 6 (six) hours as needed (Nausea or  vomiting).   ondansetron 8 MG tablet Commonly known as:  ZOFRAN Take 1 tablet (8 mg total) by mouth 2 (two) times daily as needed for refractory nausea / vomiting. Start on day 3 after chemo.   pantoprazole 40 MG tablet Commonly known as:  PROTONIX Take 40 mg by mouth daily.   prednisoLONE 15 MG/5ML solution Commonly known as:  ORAPRED Take 5-10 mLs by mouth 4 (four) times daily.   ranitidine 300 MG tablet Commonly known as:  ZANTAC Take 1 tablet (300 mg total) by mouth at bedtime.   triamterene-hydrochlorothiazide 37.5-25 MG tablet Commonly known as:  MAXZIDE-25 Take 1 tablet by mouth daily.   valACYclovir 500 MG tablet Commonly known as:  VALTREX Take 500 mg by mouth daily.         Diet and Activity recommendation: See Discharge Instructions above   Consults obtained -nephrology  Major procedures and Radiology Reports - PLEASE review detailed and final reports for all details, in brief -      Dg Chest 2 View  Result Date: 09/09/2017 CLINICAL DATA:  Fever  and cough. EXAM: CHEST - 2 VIEW COMPARISON:  01/12/2015 and prior exams FINDINGS: The cardiomediastinal silhouette is unremarkable. There is no evidence of focal airspace disease, pulmonary edema, suspicious pulmonary nodule/mass, pleural effusion, or pneumothorax. No acute bony abnormalities are identified. IMPRESSION: No active cardiopulmonary disease. Electronically Signed   By: Margarette [place] M.D.   On: 09/09/2017 17:37   Ct Chest Wo Contrast  Result Date: 08/17/2017 CLINICAL DATA:  Followup lung nodule. EXAM: CT CHEST WITHOUT CONTRAST TECHNIQUE: Multidetector CT imaging of the chest was performed following the standard protocol without IV contrast. COMPARISON:  09/23/2016 FINDINGS: Cardiovascular: The heart size appears normal. Small pericardial effusion versus thickening is similar to previous exam. Aortic atherosclerosis. Mediastinum/Nodes: Normal appearance of the thyroid gland. The trachea appears patent and is  midline. Normal appearance of the esophagus. No supraclavicular or axillary adenopathy. Lungs/Pleura: No pleural effusion. No airspace consolidation, atelectasis or pneumothorax. Stable 4 mm nodule within the periphery of the right upper lobe, image 38/2. None no additional pulmonary nodules identified. Upper Abdomen: Mild hepatic steatosis. Previous cholecystectomy. No acute abnormality noted. Musculoskeletal: Left breast lesion containing biopsy clip measures 1.9 cm, image 51/6. 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 No aggressive lytic or sclerotic bone lesions. IMPRESSION: 1. Small 4 mm right upper lobe lung nodule is unchanged when compared with the previous exam. 2. Left breast lesion containing biopsy clip noted 3.  Aortic Atherosclerosis (ICD10-I70.0). Electronically Signed   By: Kerby Moors M.D.   On: 08/17/2017 13:57   Mr Breast Bilateral W Wo Contrast Inc Cad  Result Date: 08/20/2017 CLINICAL DATA:  Breast MRI for extent of disease. 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 Patient has had multiple surgical biopsies on both breasts (left Nov 1990, Dec 2015, Mar 2018 and right Mar 2008 patient states all were negative for cancer.) Family hx paternal grandmother not sure of age and sister age 68. Started taking a chemo pill 1 week ago. Patient presented to the [facility] on 04/10/2017 with reported spontaneous right nipple discharge. An etiology for the discharge was not found at diagnostic imaging. Breast MRI was suggested, but if not performed, short-term right breast diagnostic mammography and ultrasound was recommended. Patient underwent ultrasound-guided core needle biopsy of a left breast mass at the surgical office. Revealing invasive ductal carcinoma and DCIS. LABS:  Labs not drawn at time of imaging. EXAM: BILATERAL BREAST MRI WITH AND WITHOUT CONTRAST TECHNIQUE: Multiplanar, multisequence MR images  of both breasts were obtained prior to and following the intravenous administration of 13 ml of MultiHance. THREE-DIMENSIONAL MR IMAGE RENDERING ON INDEPENDENT WORKSTATION: Three-dimensional MR images were rendered by post-processing of the original MR data on an independent workstation. The three-dimensional MR images were interpreted, and findings are reported in the following complete MRI report for this study. Three dimensional images were evaluated at the independent Dyna[employer] workstation COMPARISON:  Previous exam(s). FINDINGS: Breast composition: c. Heterogeneous fibroglandular tissue. Background parenchymal enhancement: Moderate. Right breast: There is a 5 mm enhancing mass in the anterior, retroareolar right breast with slow to moderate wash-in and plateau washout kinetics. Enhancement is homogeneous. No other right breast masses or areas of abnormal enhancement. Left breast: There is an enhancing irregular mass in the upper outer left breast measuring 2.4 x 2.1 x 2.2 cm. Mass contains artifact from biopsy clip. This represents the recently diagnosed breast carcinoma. There are no other left breast masses or areas of abnormal enhancement. Lymph nodes:  There prominent left axillary lymph nodes with cortical thickness is between 3 and 4 mm. Nodes maintain normal overall morphology with central hilar fat. No prominent right axillary lymph nodes. Ancillary findings:  None. IMPRESSION: 1. 2.4 cm enhancing irregular mass in the outer left breast reflects the recently diagnosed breast carcinoma. 2. Several prominent left axillary lymph nodes with borderline thickened cortices between 3 and 4 mm, but normal morphology. Recommend follow-up left axillary ultrasound for further assessment. 3. 5 mm enhancing mass the anterior, retroareolar region of the right breast. Given the symptoms of clear right nipple discharge current history of left breast carcinoma, MRI guided core needle biopsy is recommended. RECOMMENDATION:  1. Left axillary ultrasound, if this has not already been performed, to assess the sonographic appearance of the prominent lymph nodes noted on MRI. 2. MRI guided biopsy of the small, 5 mm, enhancing retroareolar right breast mass. BI-RADS CATEGORY  4: Suspicious. Electronically Signed   By: Lajean Manes M.D.   On: 08/20/2017 12:50   Dg Bone Density  Result Date: 08/17/2017 EXAM: DUAL X-RAY ABSORPTIOMETRY (DXA) FOR BONE MINERAL DENSITY IMPRESSION: Dear Dr Honor Loh, Your patient Teka Chanda completed a BMD test on 08/17/2017 using the [place] (analysis version: 14.10) manufactured by [employer]. The following summarizes the results of our evaluation. PATIENT BIOGRAPHICAL: Name: Shermika, Balthaser Patient ID: [id number] Birth Date: 02/21/1951 Height: 66.0 in. Gender: Female Exam Date: 08/17/2017 Weight: 166.0 lbs. Indications: Advanced Age, arthritis, asthma, Bilateral Ovariectomy, Breast ca, Caucasian, high risk meds, Hysterectomy, POSTmenopausal, skin ca Fractures: Treatments: letrozole, multivitamin, Q-VAR ASSESSMENT: The BMD measured at Femur Neck Left is 0.833 g/cm2 with a T-score of -1.5. This patient is considered osteopenic according to [place] [facility]) criteria. Site Region Measured Measured WHO Young Adult BMD Date       Age      Classification T-score AP Spine L1-L4 08/17/2017 69.1 Normal -0.7 1.098 g/cm2 DualFemur Neck Left 08/17/2017 69.1 Osteopenia -1.5 0.833 g/cm2 World Health Organization [facility]) criteria for post-menopausal, Caucasian Women: Normal:       T-score at or above -1 SD Osteopenia:   T-score between -1 and -2.5 SD Osteoporosis: T-score at or below -2.5 SD RECOMMENDATIONS: 1. All patients should optimize calcium and vitamin D intake. 2. Consider FDA-approved medical therapies in postmenopausal women and men aged 62 years and older, based on the following: a. A hip or vertebral (clinical or morphometric) fracture b. T-score < -2.5 at the femoral  neck or spine after appropriate evaluation to exclude secondary causes c. Low bone mass (T-score between -1.0 and -2.5 at the femoral neck or spine) and a 10-year probability of a hip fracture > 3% or a 10-year probability of a major osteoporosis-related fracture > 20% based on the [place]-adapted WHO algorithm d. Clinician judgment and/or patient preferences may indicate treatment for people with 10-year fracture probabilities above or below these levels FOLLOW-UP: Patients with diagnosed cases of osteoporosis or at high risk for fracture should have regular bone mineral density tests. For patients eligible for Medicare, routine testing is allowed once every 2 years. The testing frequency can be increased to one year for patients who have rapidly progressing disease, those who are receiving or discontinuing medical therapy to restore bone mass, or have additional risk factors. I have reviewed this report, and agree with the above findings. [facility] Radiology Dear Dr Honor Loh, Your patient KAYLIEE ATIENZA completed a FRAX assessment on 08/17/2017 using the [place] (analysis  version: 14.10) manufactured by [employer]. The following summarizes the results of our evaluation. PATIENT BIOGRAPHICAL: Name: Collin, Hendley Patient ID: [id number] Birth Date: November 23, 1950 Height:    66.0 in. Gender:     [age]    [age]       Weight:    166.0 lbs. Ethnicity:  White                            Exam Date: 08/17/2017 FRAX* RESULTS:  (version: 3.5) 10-year Probability of Fracture1 Major Osteoporotic Fracture2 Hip Fracture 9.8% 1.3% Population: [place] (Caucasian) Risk Factors: None Based on Femur (Left) Neck BMD 1 -The 10-year probability of fracture may be lower than reported if the patient has received treatment. 2 -Major Osteoporotic Fracture: Clinical Spine, Forearm, Hip or Shoulder *FRAX is a Materials engineer of the [employer] for Metabolic Bone Disease, a [place] (WHO) [facility]. ASSESSMENT: The probability of a major osteoporotic fracture is 9.8% within the next ten years. The probability of a hip fracture is 1.3% within the next ten years. Electronically Signed   By: Dorise Bullion III M.D   On: 08/17/2017 16:34   US Breast Complete Uni Right Inc Axilla  Result Date: 08/27/2017 Second look ultrasound was planned to determine if an MRI biopsy could be avoided.  The retroareolar area shows several cysts measuring up to 7 mm.  There is a deep-seated hypoechoic/anechoic lesion which would not correspond with an enhancing nodule on imaging.  No clear-cut correlate to the mammogram or ultrasound findings.  No images, no charge.   Mm Clip Placement Right  Result Date: 09/02/2017 CLINICAL DATA:  Evaluate biopsy marker placement EXAM: DIAGNOSTIC RIGHT MAMMOGRAM POST MRI BIOPSY COMPARISON:  Previous exam(s). FINDINGS: Mammographic images were obtained following MRI guided biopsy of a retroareolar right breast mass. The dumbbell shaped clip migrated 2 cm medial to the biopsied mass. IMPRESSION: The dumbbell shaped biopsy clip migrated 2 cm medial to the biopsied mass. Final Assessment: Post Procedure Mammograms for Marker Placement Electronically Signed   By: Dorise Bullion III M.D   On: 09/02/2017 09:41   Mr Rt Breast Bx Johnella Moloney Dev 1st Lesion Image Bx Spec Mr Guide  Addendum Date: 09/03/2017   ADDENDUM REPORT: 09/03/2017 11:03 ADDENDUM: Pathology revealed FIBROCYSTIC CHANGE AND USUAL DUCTAL HYPERPLASIA, DILATED DUCTS, definitive papilloma is not seen of RIGHT breast, retroareolar. This was found to be concordant by Dr. Hassan Rowan. Pathology results were discussed with the patient by telephone. The patient reported doing well after the biopsy with tenderness at the site. Post biopsy instructions and care were reviewed and questions were answered. The patient was encouraged to call [facility] for any additional  concerns. The patient has a recent diagnosis of LEFT breast cancer and should follow her outlined treatment plan. Pathology results reported by Roselind Messier, RN on 09/03/2017. Electronically Signed   By: Margarette [place] M.D.   On: 09/03/2017 11:03   Result Date: 09/03/2017 CLINICAL DATA:  67 year old female for tissue sampling of 5 mm RETROAREOLAR RIGHT breast mass. EXAM: MRI GUIDED CORE NEEDLE BIOPSY OF THE RIGHT BREAST TECHNIQUE: Multiplanar, multisequence MR imaging of the RIGHT breast was performed both before and after administration of intravenous contrast. CONTRAST:  90m MULTIHANCE GADOBENATE DIMEGLUMINE 529 MG/ML IV SOLN COMPARISON:  Previous exams. FINDINGS: I met with the patient, and we discussed the procedure  of MRI guided biopsy, including risks, benefits, and alternatives. Specifically, we discussed the risks of infection, bleeding, tissue injury, clip migration, and inadequate sampling. Informed, written consent was given. The usual time out protocol was performed immediately prior to the procedure. Using sterile technique, 1% Lidocaine, MRI guidance, and a 9 gauge vacuum assisted device, biopsy was performed of the 5 mm enhancing mass in the RETROAREOLAR RIGHT breast using a LATERAL approach. At the conclusion of the procedure, a hourglass shaped tissue marker clip was deployed into the biopsy cavity. Follow-up 2-view mammogram was performed and dictated separately. IMPRESSION: MRI guided biopsy of RETROAREOLAR RIGHT breast mass. No apparent complications. Electronically Signed: By: Margarette [place] M.D. On: 09/02/2017 10:01    Micro Results     Recent Results (from the past 240 hour(s))  Gastrointestinal Panel by PCR , Stool     Status: Abnormal   Collection Time: 09/09/17  2:26 PM  Result Value Ref Range Status   Campylobacter species NOT DETECTED NOT DETECTED Final   Plesimonas shigelloides NOT DETECTED NOT DETECTED Final   Salmonella species NOT DETECTED NOT DETECTED Final   Yersinia  enterocolitica NOT DETECTED NOT DETECTED Final   Vibrio species NOT DETECTED NOT DETECTED Final   Vibrio cholerae NOT DETECTED NOT DETECTED Final   Enteroaggregative E coli (EAEC) NOT DETECTED NOT DETECTED Final   Enteropathogenic E coli (EPEC) DETECTED (A) NOT DETECTED Final    Comment: RESULT CALLED TO, READ BACK BY AND VERIFIED WITH: BRENDA ELLINGTON ON 09/11/17 AT 0953 QSD    Enterotoxigenic E coli (ETEC) NOT DETECTED NOT DETECTED Final   Shiga like toxin producing E coli (STEC) NOT DETECTED NOT DETECTED Final   Shigella/Enteroinvasive E coli (EIEC) NOT DETECTED NOT DETECTED Final   Cryptosporidium NOT DETECTED NOT DETECTED Final   Cyclospora cayetanensis NOT DETECTED NOT DETECTED Final   Entamoeba histolytica NOT DETECTED NOT DETECTED Final   Giardia lamblia NOT DETECTED NOT DETECTED Final   Adenovirus F40/41 NOT DETECTED NOT DETECTED Final   Astrovirus NOT DETECTED NOT DETECTED Final   Norovirus GI/GII NOT DETECTED NOT DETECTED Final   Rotavirus A NOT DETECTED NOT DETECTED Final   Sapovirus (I, II, IV, and V) NOT DETECTED NOT DETECTED Final    Comment: Performed at [facility], [address] [place], [place] [address]  Urine Culture     Status: Abnormal   Collection Time: 09/09/17  2:26 PM  Result Value Ref Range Status   Specimen Description   Final    URINE, CLEAN CATCH Performed at [facility], [address], [place], [place] [address]    Special Requests   Final    NONE Performed at [facility], [address] [place], [place] [address]    Culture MULTIPLE SPECIES PRESENT, SUGGEST RECOLLECTION (A)  Final   Report Status 09/11/2017 FINAL  Final  C Difficile Quick Screen w PCR reflex     Status: None   Collection Time: 09/09/17  2:27 PM  Result Value Ref Range Status   C Diff antigen NEGATIVE NEGATIVE Final   C Diff toxin NEGATIVE NEGATIVE Final   C Diff interpretation No C. difficile detected.  Final    Comment: Performed at  [facility], [address], [place], [place] [address]  Culture, blood (Routine X 2) w Reflex to ID Panel     Status: None   Collection Time: 09/09/17  2:27 PM  Result Value Ref Range Status   Specimen Description  Final    BLOOD Performed at [facility], [address], [place], [place] [address]    Special Requests   Final    LEFT ANTECUBITAL Performed at [facility], [address], [place], [place] [address]    Culture   Final    NO GROWTH 5 DAYS Performed at [facility], [address] [place], [place] [address]    Report Status 09/14/2017 FINAL  Final  Culture, blood (Routine X 2) w Reflex to ID Panel     Status: None   Collection Time: 09/09/17  2:27 PM  Result Value Ref Range Status   Specimen Description   Final    BLOOD Performed at [facility], [address] [place], [place] [address]    Special Requests   Final    RIGHT ANTECUBITAL Performed at [facility], [address], [place], [place] [address]    Culture   Final    NO GROWTH 5 DAYS Performed at [facility], [address] [place], [place] [address]    Report Status 09/14/2017 FINAL  Final  Gastrointestinal Panel by PCR , Stool     Status: Abnormal   Collection Time: 09/11/17 10:08 PM  Result Value Ref Range Status   Campylobacter species NOT DETECTED NOT DETECTED Final   Plesimonas shigelloides NOT DETECTED NOT DETECTED Final   Salmonella species NOT DETECTED NOT DETECTED Final   Yersinia enterocolitica NOT DETECTED NOT DETECTED Final   Vibrio species NOT DETECTED NOT DETECTED Final   Vibrio cholerae NOT DETECTED NOT DETECTED Final   Enteroaggregative E coli (EAEC) NOT DETECTED NOT DETECTED Final   Enteropathogenic E coli (EPEC) DETECTED (A) NOT DETECTED Final    Comment: RESULT CALLED TO, READ BACK BY AND VERIFIED WITH: JEANNE MUCESI ON 09/12/17 AT 0024 [facility]    Enterotoxigenic E coli (ETEC) NOT DETECTED NOT  DETECTED Final   Shiga like toxin producing E coli (STEC) NOT DETECTED NOT DETECTED Final   Shigella/Enteroinvasive E coli (EIEC) NOT DETECTED NOT DETECTED Final   Cryptosporidium NOT DETECTED NOT DETECTED Final   Cyclospora cayetanensis NOT DETECTED NOT DETECTED Final   Entamoeba histolytica NOT DETECTED NOT DETECTED Final   Giardia lamblia NOT DETECTED NOT DETECTED Final   Adenovirus F40/41 NOT DETECTED NOT DETECTED Final   Astrovirus NOT DETECTED NOT DETECTED Final   Norovirus GI/GII NOT DETECTED NOT DETECTED Final   Rotavirus A NOT DETECTED NOT DETECTED Final   Sapovirus (I, II, IV, and V) NOT DETECTED NOT DETECTED Final    Comment: Performed at [facility] [facility], [address], [place], [place] [address]       Today   Subjective:   Nelissa Bolduc today has no headache,no chest abdominal pain,no new weakness tingling or numbness, feels much better wants to go home today.   Objective:   Blood pressure 124/71, pulse 86, temperature 98.4 F (36.9 C), temperature source Oral, resp. rate 20, height _0  (1.651 m), weight 70.1 kg (154 lb 9.6 oz), SpO2 98 %.   Intake/Output Summary (Last 24 hours) at 09/15/2017 1001 Last data filed at 09/14/2017 1340 Gross per 24 hour  Intake 2939 ml  Output -  Net 2939 ml    Exam Awake Alert, Oriented x 3, No new F.N deficits, Normal affect .AT,PERRAL Supple Neck,No JVD, No cervical lymphadenopathy appriciated.  Symmetrical Chest wall movement, Good air movement bilaterally, CTAB RRR,No Gallops,Rubs or new Murmurs, No Parasternal Heave +ve B.Sounds, Abd Soft,  Non tender, No organomegaly appriciated, No rebound -guarding or rigidity. No Cyanosis, Clubbing or edema, No new Rash or bruise  Data Review   CBC w Diff:  Lab Results  Component Value Date   WBC 12.5 (H) 09/14/2017   HGB 13.2 09/14/2017   HCT 39.5 09/14/2017   PLT 183 09/14/2017   LYMPHOPCT 16 09/12/2017   BANDSPCT 2 09/12/2017   MONOPCT 2 09/12/2017   EOSPCT 0  09/12/2017   BASOPCT 1 09/12/2017    CMP:  Lab Results  Component Value Date   NA 137 09/14/2017   K 3.8 09/14/2017   CL 103 09/14/2017   CO2 27 09/14/2017   BUN 11 09/14/2017   CREATININE 1.01 (H) 09/14/2017   PROT 7.2 09/11/2017   ALBUMIN 3.7 09/11/2017   BILITOT 0.7 09/11/2017   ALKPHOS 112 09/11/2017   AST 24 09/11/2017   ALT 26 09/11/2017  .   Total Time in preparing paper work, data evaluation and todays exam - 35 minutes  Epifanio Lesches M.D on 09/15/2017 at 10:01 AM    Note: This dictation was prepared with Dragon dictation along with smaller phrase technology. Any transcriptional errors that result from this process are unintentional.

## 2017-09-15 NOTE — Plan of Care (Signed)
  Problem: Clinical Measurements: Goal: Ability to maintain clinical measurements within normal limits will improve Outcome: Progressing   Problem: Nutrition: Goal: Adequate nutrition will be maintained Outcome: Progressing   Problem: Elimination: Goal: Will not experience complications related to bowel motility Outcome: Progressing

## 2017-09-15 NOTE — Progress Notes (Signed)
Pt for discharge home. Alert/ no resp distress.  meds discussed with pt/ diet/ activity and f/u discussed. Verbalizes understsnding of all plans , sl d/cd earlier,

## 2017-09-18 ENCOUNTER — Ambulatory Visit: Admit: 2017-09-18 | Payer: Medicare Other | Admitting: General Surgery

## 2017-09-18 ENCOUNTER — Ambulatory Visit: Payer: Medicare Other

## 2017-09-18 SURGERY — MASTECTOMY WITH SENTINEL LYMPH NODE BIOPSY
Anesthesia: Choice | Laterality: Left

## 2017-09-21 ENCOUNTER — Telehealth: Payer: Self-pay | Admitting: *Deleted

## 2017-09-21 NOTE — Telephone Encounter (Signed)
Patient states that while she was in the hospital Dr. Mike Gip told her that her July 18th appointment would be canceled and a new chemo regimen ordered. She still has an appt on for tomorrow.

## 2017-09-21 NOTE — Telephone Encounter (Signed)
She needs a hospital follow up visit. I thought she was coming in on Friday last week to see [facility], but it doesn't look like she did. We need to follow up on her recovery and check routine labs to make sure that she is on the road to recovery.   She will not receive treatment when she comes in. I agree that that should be postponed this week. When she does start back, we are going to have to reduce her dose.   Honor Loh, MSN, APRN, FNP-C, CEN Oncology/Hematology Nurse Practitioner  [facility] 09/21/17, 4:23 PM

## 2017-09-23 ENCOUNTER — Other Ambulatory Visit: Payer: Medicare Other

## 2017-09-24 ENCOUNTER — Inpatient Hospital Stay (HOSPITAL_BASED_OUTPATIENT_CLINIC_OR_DEPARTMENT_OTHER): Payer: Medicare Other | Admitting: Urgent Care

## 2017-09-24 ENCOUNTER — Inpatient Hospital Stay: Payer: Medicare Other

## 2017-09-24 VITALS — BP 112/75 | HR 75 | Temp 97.3°F | Resp 18 | Wt 159.5 lb

## 2017-09-24 DIAGNOSIS — Z7189 Other specified counseling: Secondary | ICD-10-CM

## 2017-09-24 DIAGNOSIS — Z170 Estrogen receptor positive status [ER+]: Secondary | ICD-10-CM | POA: Diagnosis not present

## 2017-09-24 DIAGNOSIS — A09 Infectious gastroenteritis and colitis, unspecified: Secondary | ICD-10-CM

## 2017-09-24 DIAGNOSIS — M549 Dorsalgia, unspecified: Secondary | ICD-10-CM

## 2017-09-24 DIAGNOSIS — R5383 Other fatigue: Secondary | ICD-10-CM | POA: Diagnosis not present

## 2017-09-24 DIAGNOSIS — C50912 Malignant neoplasm of unspecified site of left female breast: Secondary | ICD-10-CM

## 2017-09-24 DIAGNOSIS — B370 Candidal stomatitis: Secondary | ICD-10-CM | POA: Diagnosis not present

## 2017-09-24 DIAGNOSIS — M8580 Other specified disorders of bone density and structure, unspecified site: Secondary | ICD-10-CM

## 2017-09-24 DIAGNOSIS — B9620 Unspecified Escherichia coli [E. coli] as the cause of diseases classified elsewhere: Secondary | ICD-10-CM | POA: Diagnosis not present

## 2017-09-24 DIAGNOSIS — N179 Acute kidney failure, unspecified: Secondary | ICD-10-CM | POA: Diagnosis not present

## 2017-09-24 DIAGNOSIS — Z5111 Encounter for antineoplastic chemotherapy: Secondary | ICD-10-CM | POA: Diagnosis not present

## 2017-09-24 DIAGNOSIS — C50412 Malignant neoplasm of upper-outer quadrant of left female breast: Secondary | ICD-10-CM

## 2017-09-24 DIAGNOSIS — T451X5A Adverse effect of antineoplastic and immunosuppressive drugs, initial encounter: Secondary | ICD-10-CM

## 2017-09-24 DIAGNOSIS — R509 Fever, unspecified: Secondary | ICD-10-CM | POA: Diagnosis not present

## 2017-09-24 DIAGNOSIS — R531 Weakness: Secondary | ICD-10-CM

## 2017-09-24 DIAGNOSIS — N6321 Unspecified lump in the left breast, upper outer quadrant: Secondary | ICD-10-CM

## 2017-09-24 DIAGNOSIS — R809 Proteinuria, unspecified: Secondary | ICD-10-CM

## 2017-09-24 DIAGNOSIS — R05 Cough: Secondary | ICD-10-CM | POA: Diagnosis not present

## 2017-09-24 DIAGNOSIS — E876 Hypokalemia: Secondary | ICD-10-CM

## 2017-09-24 DIAGNOSIS — L658 Other specified nonscarring hair loss: Secondary | ICD-10-CM

## 2017-09-24 DIAGNOSIS — R197 Diarrhea, unspecified: Secondary | ICD-10-CM

## 2017-09-24 LAB — CBC WITH DIFFERENTIAL/PLATELET
Basophils Absolute: 0 10*3/uL (ref 0–0.1)
Basophils Relative: 1 %
Eosinophils Absolute: 0.5 10*3/uL (ref 0–0.7)
Eosinophils Relative: 9 %
HCT: 33.4 % — ABNORMAL LOW (ref 35.0–47.0)
Hemoglobin: 11.7 g/dL — ABNORMAL LOW (ref 12.0–16.0)
Lymphocytes Relative: 23 %
Lymphs Abs: 1.2 10*3/uL (ref 1.0–3.6)
MCH: 32.1 pg (ref 26.0–34.0)
MCHC: 35.1 g/dL (ref 32.0–36.0)
MCV: 91.3 fL (ref 80.0–100.0)
Monocytes Absolute: 0.3 10*3/uL (ref 0.2–0.9)
Monocytes Relative: 6 %
Neutro Abs: 3.2 10*3/uL (ref 1.4–6.5)
Neutrophils Relative %: 61 %
Platelets: 213 10*3/uL (ref 150–440)
RBC: 3.66 MIL/uL — ABNORMAL LOW (ref 3.80–5.20)
RDW: 12.3 % (ref 11.5–14.5)
WBC: 5.3 10*3/uL (ref 3.6–11.0)

## 2017-09-24 LAB — COMPREHENSIVE METABOLIC PANEL
ALT: 19 U/L (ref 0–44)
AST: 21 U/L (ref 15–41)
Albumin: 3.3 g/dL — ABNORMAL LOW (ref 3.5–5.0)
Alkaline Phosphatase: 74 U/L (ref 38–126)
Anion gap: 10 (ref 5–15)
BUN: 16 mg/dL (ref 8–23)
CO2: 27 mmol/L (ref 22–32)
Calcium: 8.5 mg/dL — ABNORMAL LOW (ref 8.9–10.3)
Chloride: 103 mmol/L (ref 98–111)
Creatinine, Ser: 0.97 mg/dL (ref 0.44–1.00)
GFR calc Af Amer: >60 mL/min (ref >60)
GFR calc non Af Amer: 58 mL/min — ABNORMAL LOW (ref >60)
Glucose, Bld: 147 mg/dL — ABNORMAL HIGH (ref 70–99)
Potassium: 2.9 mmol/L — ABNORMAL LOW (ref 3.5–5.1)
Sodium: 140 mmol/L (ref 135–145)
Total Bilirubin: 0.5 mg/dL (ref 0.3–1.2)
Total Protein: 5.9 g/dL — ABNORMAL LOW (ref 6.5–8.1)

## 2017-09-24 LAB — MAGNESIUM: Magnesium: 2 mg/dL (ref 1.7–2.4)

## 2017-09-24 MED ORDER — POTASSIUM CHLORIDE CRYS ER 20 MEQ PO TBCR: EXTENDED_RELEASE_TABLET | ORAL | 0 refills | Status: DC

## 2017-09-24 NOTE — Progress Notes (Signed)
Patient offers no complaints other than being tired.

## 2017-09-24 NOTE — Progress Notes (Signed)
[facility] day:  09/24/2017  Chief Complaint: Mariah Hogan is a 67 y.o. female with clinical stage IA left breast cancer who is seen for 1 week week hospital follow up visit.   HPI:  The patient was last seen in the medical oncology clinic on 09/09/2017.  At that time, patient presented with complains of "feeling bad". She had experienced fatigue, nausea, diarrhea, and fever following her first treatment. She had poor oral intake; weight down 9 pounds. She complained of significant back pain. Patient was struggling with oral candidiasis; using Diflucan and magic mouthwash. Patient also had a non-productive cough. Additional labs were ordered for fever workup.   CBC on 09/26/2017 demonstrated a WBC of 1600 (ANC 800). Hemoglobin 14.7, hematocrit 44.1, MCV 94.1, and platelets 159,000. Sodium low at 133. BUN 36 and creatinine 1.35. UA was negative for infection. Stool studies were collected. Blood cultures x 2 were (-). Patient was discharged home with instructions to increase fluid intake. She was to call the clinic for worsening symptoms.   Stool studies resulted on 09/11/2017. C.diff (-). GI panel (+) for enteropathogenic E.coli. Patient was contacted. She described continued frequent diarrhea and poor oral intake. She was offered an appointment in the symptom management clinic, which was ultimately accepted. She was seen in clinic that same day by Mariah Resides, NP. Notes reviewed. WBC had increased to 20,600. Her creatinine was up to 1.87 (CrCl 28.3 mL/min). She was given 1L 0.9% NS bolus in clinic, which did little to improve the way that she felt overall. Dr. Mike Hogan was consulted and the recommendation was made for admission.   Patient admitted to the oncology unit at [facility] from 09/11/2017 - 09/15/2017. Notes reviewed. Patient was treated with a 3 day course of intravenous azithromycin. Nephrology was consulted during admission. Diarrhea resolved. Patient had  persistent for which she received ceftriaxone x 2 days. She was ultimately discharged home following an uncomplicated hospital course with instructions to follow up with oncology.   Patient was to be seen in the medical oncology clinic on 09/18/2017 for a hospital follow up assessment. She was unable to be contacted for an appointment. She contacted the clinic on 09/21/2017 wanting to cancel her scheduled appointment on 09/24/2017 citing that Dr. Mike Hogan advised her that she would not receive treatment. Patient was advised that she needed to be seen in order to follow up on her condition and to check her labs following her recent hospital admission. Patient agreed.   In the interim, patient notes that she is feeling "better". She remains markedly fatigued and notes that she is sleeping most of the day. Her fever and diarrhea has fully resolved. She continues to have an intermittent non-productive cough. Patient's hair has started to come out. She has already secured a wig, which she is wearing today in clinic.  Patient advises that she maintains an adequate appetite. She is eating better and trying to increase her fluid intake to prevent recurrent dehydration. Weight today is 159 lb 8 oz (72.3 kg), which compared to her last visit to the clinic, represents a 4 pound increase.   Patient denies pain in the clinic today.  Past Medical History:  Diagnosis Date  . Arthritis   . Asthma   . Breast cancer ([place]) 08/04/2017   left breast INVASIVE DUCTAL CARCINOMA.  . Cancer ([place])    [phone] basal cell carcinoma  . GERD (gastroesophageal reflux disease)   . Hyperlipidemia   . Hypertension  Past Surgical History:  Procedure Laterality Date  . ABDOMINAL HYSTERECTOMY  1998  . bladder tack  [id number]  . BREAST BIOPSY Bilateral   . BREAST BIOPSY Left 1990  . BREAST BIOPSY Left 2018   core bx- neg  . 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  Marland Kitchen BREAST  CYST EXCISION  x3  . BREAST SURGERY Left 02/13/14   Fibrocystic changes, pseudo-angiomatous stromal hyperplasia. No atypia or malignancy.  . CARPAL TUNNEL RELEASE  x2  . CHOLECYSTECTOMY  2008  . COLONOSCOPY WITH PROPOFOL N/A 01/12/2015   Procedure: COLONOSCOPY WITH PROPOFOL;  Surgeon: Mariah Silvas, MD;  Location: [facility] ENDOSCOPY;  Service: Endoscopy;  Laterality: N/A;  . ESOPHAGOGASTRODUODENOSCOPY (EGD) WITH PROPOFOL N/A 01/12/2015   Procedure: ESOPHAGOGASTRODUODENOSCOPY (EGD) WITH PROPOFOL;  Surgeon: Mariah Silvas, MD;  Location: [facility] ENDOSCOPY;  Service: Endoscopy;  Laterality: N/A;  . NASAL RECONSTRUCTION  1983  . SAVORY DILATION N/A 01/12/2015   Procedure: SAVORY DILATION;  Surgeon: Mariah Silvas, MD;  Location: [facility] ENDOSCOPY;  Service: Endoscopy;  Laterality: N/A;  . TONSILLECTOMY AND ADENOIDECTOMY  1956    Family History  Problem Relation Age of Onset  . Breast cancer Sister 5       currently 98  . Breast cancer Paternal Grandmother 73       bilateral; deceased 27s   . Breast cancer Cousin 57       daughter of an unaffected maternal aunt  . Breast cancer Cousin 4       kidney cancer also; daughter of an unaffected maternal aunt  . Other Mother        TAH/BSO prior to menopause  . Prostate cancer Paternal Grandfather        age at dx unknown  . Breast cancer Maternal Aunt        age at dx unknown  . Breast cancer Cousin 15       daughter of an unaffected maternal aunt    Social History:  reports that she has never smoked. She has never used smokeless tobacco. She reports that she does not drink alcohol or use drugs.  Her daughter is Mariah Hogan, [place]. She is a retired Customer service manager for 35 years. Patient denies known exposures to radiation on toxins. Her daughter's name is Mariah Hogan.  She is alone today.  Allergies:  Allergies  Allergen Reactions  . Penicillin G Shortness Of Breath, Swelling and Rash    Has patient had a PCN reaction causing immediate rash,  facial/tongue/throat swelling, SOB or lightheadedness with hypotension: Yes Has patient had a PCN reaction causing severe rash involving mucus membranes or skin necrosis: No Has patient had a PCN reaction that required hospitalization: No Has patient had a PCN reaction occurring within the last 10 years: No If all of the above answers are "NO", then may proceed with Cephalosporin use.  Marland Kitchen Fluocinonide   . Other Other (See Comments)    Dodecyl gallate: Positive patch test  Elta MD UV Clear SPF 46: Positive patch test  fragrance dodocylgallate dodocylgallate  . Parabens Other (See Comments)    Positive patch test   . Shellac   . Triamcinolone Acetonide   . Latex Rash    rash  . Triamcinolone Rash    Current Medications: Current Outpatient Medications  Medication Sig Dispense Refill  . albuterol (PROVENTIL HFA;VENTOLIN HFA) 108 (90 Base) MCG/ACT inhaler Inhale 2 puffs into the lungs every 6 (six) hours as needed for wheezing or  shortness of breath. 1 Inhaler 1  . atenolol (TENORMIN) 50 MG tablet Take 50 mg by mouth daily.    . Cholecalciferol 1000 UNITS tablet Take 1,000 Units by mouth daily.    . citalopram (CELEXA) 20 MG tablet Take 20 mg by mouth daily.    Marland Kitchen loratadine (CLARITIN) 10 MG tablet Take 10 mg by mouth at bedtime.     Marland Kitchen LORazepam (ATIVAN) 0.5 MG tablet Take 1 tablet (0.5 mg total) by mouth every 6 (six) hours as needed (Nausea or vomiting). 30 tablet 0  . ondansetron (ZOFRAN) 8 MG tablet Take 1 tablet (8 mg total) by mouth 2 (two) times daily as needed for refractory nausea / vomiting. Start on day 3 after chemo. 30 tablet 1  . pantoprazole (PROTONIX) 40 MG tablet Take 40 mg by mouth daily.    . prednisoLONE (ORAPRED) 15 MG/5ML solution Take 5-10 mLs by mouth 4 (four) times daily.    . ranitidine (ZANTAC) 300 MG tablet Take 1 tablet (300 mg total) by mouth at bedtime. 30 tablet 5  . triamterene-hydrochlorothiazide (MAXZIDE-25) 37.5-25 MG tablet Take 1 tablet by mouth daily.     . valACYclovir (VALTREX) 500 MG tablet Take 500 mg by mouth daily.    Marland Kitchen dexamethasone (DECADRON) 4 MG tablet Take 2 tablets (8 mg total) by mouth 2 (two) times daily. Start the day before Taxotere. Then again the day after chemo for 3 days. (Patient not taking: Reported on 09/11/2017) 30 tablet 1  . potassium chloride SA (K-DUR,KLOR-CON) 20 MEQ tablet 1 tab (20 mEq) daily x 3 days. Retain extra supply for use as directed by oncology. 6 tablet 0   No current facility-administered medications for this visit.     Review of Systems  Constitutional: Positive for malaise/fatigue. Negative for diaphoresis, fever and weight loss (weigh up 4 pounds since 09/09/2017).       "Im feeling better". Sleeping a lot.   HENT: Negative.   Eyes: Negative.   Respiratory: Positive for cough (chronic) and shortness of breath (exertional). Negative for hemoptysis and sputum production.   Cardiovascular: Positive for palpitations (intermittent). Negative for chest pain, orthopnea, leg swelling and PND.  Gastrointestinal: Positive for nausea (intermiitent - controlled with interventions). Negative for abdominal pain, blood in stool, constipation, diarrhea, melena and vomiting.  Genitourinary: Negative for dysuria, frequency, hematuria and urgency.  Musculoskeletal: Positive for back pain and joint pain (hands and feet). Negative for falls and myalgias.  Skin: Negative for itching and rash.       PMH (+) for BCC to face and leg.  Neurological: Positive for weakness (generalized). Negative for dizziness, tremors and headaches.  Endo/Heme/Allergies: Does not bruise/bleed easily.  Psychiatric/Behavioral: Negative for depression, memory loss and suicidal ideas. The patient is nervous/anxious. The patient does not have insomnia.   All other systems reviewed and are negative.  Performance status (ECOG): 1 - Symptomatic but completely ambulatory  Vital Signs BP 112/75 (BP Location: Left Arm, Patient Position: Sitting)    Pulse 75   Temp (!) 97.3 F (36.3 C) (Tympanic)   Resp 18   Wt 159 lb 8 oz (72.3 kg)   BMI 26.54 kg/m   Physical Exam  Constitutional: She is oriented to person, place, and time and well-developed, well-nourished, and in no distress.  HENT:  Head: Normocephalic and atraumatic. Hair is abnormal (chemotherapy induced alopecia. Wearing a wig).  Eyes: Pupils are equal, round, and reactive to light. EOM are normal. No scleral icterus.  Blue eyes  Neck:  Normal range of motion. Neck supple. No tracheal deviation present. No thyromegaly present.  Cardiovascular: Normal rate, regular rhythm and normal heart sounds. Exam reveals no gallop and no friction rub.  No murmur heard. Pulmonary/Chest: Effort normal and breath sounds normal. No respiratory distress. She has no wheezes. She has no rales.  Cough  Abdominal: Soft. Bowel sounds are normal. She exhibits no distension. There is no tenderness.  Musculoskeletal: Normal range of motion. She exhibits no edema or tenderness.  Lymphadenopathy:    She has no cervical adenopathy.    She has no axillary adenopathy.       Right: No inguinal and no supraclavicular adenopathy present.       Left: No inguinal and no supraclavicular adenopathy present.  Neurological: She is alert and oriented to person, place, and time.  Skin: Skin is warm and dry. No rash noted. No erythema.  Psychiatric: Mood, affect and judgment normal.  Nursing note and vitals reviewed.   Appointment on 09/24/2017  Component Date Value Ref Range Status  . Sodium 09/24/2017 140  135 - 145 mmol/L Final  . Potassium 09/24/2017 2.9* 3.5 - 5.1 mmol/L Final  . Chloride 09/24/2017 103  98 - 111 mmol/L Final   Please note change in reference range.  . CO2 09/24/2017 27  22 - 32 mmol/L Final  . Glucose, Bld 09/24/2017 147* 70 - 99 mg/dL Final   Please note change in reference range.  . BUN 09/24/2017 16  8 - 23 mg/dL Final   Please note change in reference range.  . Creatinine, Ser  09/24/2017 0.97  0.44 - 1.00 mg/dL Final  . Calcium 09/24/2017 8.5* 8.9 - 10.3 mg/dL Final  . Total Protein 09/24/2017 5.9* 6.5 - 8.1 g/dL Final  . Albumin 09/24/2017 3.3* 3.5 - 5.0 g/dL Final  . AST 09/24/2017 21  15 - 41 U/L Final  . ALT 09/24/2017 19  0 - 44 U/L Final   Please note change in reference range.  . Alkaline Phosphatase 09/24/2017 74  38 - 126 U/L Final  . Total Bilirubin 09/24/2017 0.5  0.3 - 1.2 mg/dL Final  . GFR calc non Af Amer 09/24/2017 58* >60 mL/min Final  . GFR calc Af Amer 09/24/2017 >60  >60 mL/min Final   Comment: (NOTE) The eGFR has been calculated using the CKD EPI equation. This calculation has not been validated in all clinical situations. eGFR's persistently <60 mL/min signify possible Chronic Kidney Disease.   Georgiann Hahn gap 09/24/2017 10  5 - 15 Final   Performed at [facility], [address] [place], [place] [address]  . WBC 09/24/2017 5.3  3.6 - 11.0 K/uL Final  . RBC 09/24/2017 3.66* 3.80 - 5.20 MIL/uL Final  . Hemoglobin 09/24/2017 11.7* 12.0 - 16.0 g/dL Final  . HCT 09/24/2017 33.4* 35.0 - 47.0 % Final  . MCV 09/24/2017 91.3  80.0 - 100.0 fL Final  . MCH 09/24/2017 32.1  26.0 - 34.0 pg Final  . MCHC 09/24/2017 35.1  32.0 - 36.0 g/dL Final  . RDW 09/24/2017 12.3  11.5 - 14.5 % Final  . Platelets 09/24/2017 213  150 - 440 K/uL Final  . Neutrophils Relative % 09/24/2017 61  % Final  . Neutro Abs 09/24/2017 3.2  1.4 - 6.5 K/uL Final  . Lymphocytes Relative 09/24/2017 23  % Final  . Lymphs Abs 09/24/2017 1.2  1.0 - 3.6 K/uL Final  . Monocytes Relative 09/24/2017 6  % Final  . Monocytes Absolute 09/24/2017  0.3  0.2 - 0.9 K/uL Final  . Eosinophils Relative 09/24/2017 9  % Final  . Eosinophils Absolute 09/24/2017 0.5  0 - 0.7 K/uL Final  . Basophils Relative 09/24/2017 1  % Final  . Basophils Absolute 09/24/2017 0.0  0 - 0.1 K/uL Final   Performed at [facility], [address] [place], [place] [address]  . Magnesium 09/24/2017  2.0  1.7 - 2.4 mg/dL Final   Performed at [facility], [address] [place], [place] [address]    Assessment:  Mariah Hogan is a 67 y.o. female with clinical stage IA left breast cancer s/p biopsy on 08/04/2017.  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  Tumor was ER + (95%), PR+ (20%), and Her 2 neu - by FISH.  Ki67 was 80%.  CA27.29 was 17.3 on 08/06/2017.  MammaPrint was high risk luminal-type B with a 10 year risk of recurrence untreated of 29% and 94.6% of distant metastasis free interval with chemotherapy + hormonal therapy.  Left breast ultrasound on 08/04/2017 revealed a 1.65 x 1.69 x 1.86 cm multi-lobulated hypoechoic mass in the upper outer quadrant of the left breast in the 2 o'clock position 7 cm from the nipple.  Bilateral breast MRI on 08/20/2017 revealed a 2.4 cm enhancing irregular mass in the outer left breast.  There were several prominent lymph nodes with borderline thicked cortices between 3-4 mm, but normal morphology.  There was a 5 mm enhancing mass in the anterior, retroareolar region of the right breast  RIGHT breast retroareolar biopsy on 09/02/2017 revealed no evidence of malignancy.  There was fibrocystic change and usual ductal hyperplasia.  Chest CT on 09/23/2016 revealed a 4 mm RUL subpleural nodule.  Chest CT on 08/17/2017 revealed a small 4 mm RUL nodule unchanged in size.  She received neoadjuvant Femara (08/12/2017 - 08/31/2017) secondary to planned a delay in surgery (mastectomy with immediate reconstruction).  Femara was discontinued after high risk Mammaprint returned.  She is s/p cycle #1 neoadjuvant Taxotere and Cytoxan (09/03/2017).  Bone density on 08/17/2017 revealed osteopenia with a T-score of -1.5 in the left femoral neck and -0.7 in the AP spine L1-L4.  She has a significant family history of breast cancer (paternal grandmother, sister, and 3 maternal cousins).   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  She was admitted to [facility] from 09/11/2017 - 09/15/2017 with enteropathogenic E.Coli (EPEC) and associated acute renal failure (creatinine 1.87; CrCl 28.3 ml/min). She was treated with intravenous azithromycin and ceftriaxone.   Symptomatically, patient is feeling better. Her energy remains low. She is sleeping a lot. No recurrent fever or diarrhea. Some nausea, but it is well controlled with the prescribed interventions. Patient losing hair. She is wearing a wig. Eating and drinking better. Weight up 4 pounds. Non-productive cough persists, but has improved overall. Exam is unremarkable. WBC 5300 ([place] 3200). Potassium 2.9. BUN 16 and creatinine 0.97 (Crcl  54.5 mL/min).   Plan: 1. Labs today: CBC with diff, CMP, Mg 2. Discuss interval admission. Stool (+) for EPEC. Treated with azithromycin and ceftriaxone. Feeling better.  3. Discuss plans for continued treatment. Planning to defer chemotherapy this week due to recent admission. Will plan on cycle #2 TC next week if patient has recovered. Discussed plans to dose reduce her therapy due to poor tolerance of her initial cycle.  4. Discuss low potassium. Potassium level 2.9 today. Prescription will  be sent in for 20 mEq oral potassium supplement (K-Dur) to be taken daily x 5 days.  5. Discuss symptom management.  Patient has antiemetics and pain medications at home to use on a PRN basis. Patient  advising that the prescribed interventions are adequate at this point. Continue all medications as previously prescribed.  6. Discuss hair prosthetic. Patient is asking for a Rx that will allow for her to be reimbursed for her recently purchased hair prosthesis. Rx provided. Additionally, nurse navigator asked to speak with patient in clinic regarding financial resources available to her.  7. RTC on 10/01/2017 for MD assessment, labs (CBC with diff, CMP, Mg) and cycle #2 TC  chemotherapy with Udencya support the following day.  Dose will bereduced due to severe side effects associated with initial cycle.   Honor Loh, NP  09/24/2017, 11:25 AM

## 2017-09-24 NOTE — Progress Notes (Signed)
  Oncology Nurse Navigator Documentation  Navigator Location: [facility] (09/24/17 1100)   )Navigator Encounter Type: Clinic/MDC (09/24/17 1100)                     Patient Visit Type: Follow-up (09/24/17 1100) Treatment Phase: Active Tx (09/24/17 1100) Barriers/Navigation Needs: Financial;Education (09/24/17 1100) Education: Concerns with Finances/ Eligibility (09/24/17 1100)                        Time Spent with Patient: 30 (09/24/17 1100)   Met with patient to discuss assistance with bills while she is receiving treatment.  She plans to bring mortgage bill, and will assist through [employer].  Patient also has a "cancer policy" that she needs assistance with.  To bring forms to navigator or Honor Loh, and will Ask Lula Olszewski to assist with completing forms.   Reviewed some of the programs/services offered to patients, and how to access.

## 2017-09-29 ENCOUNTER — Other Ambulatory Visit: Payer: Medicare Other

## 2017-09-29 ENCOUNTER — Ambulatory Visit: Payer: Medicare Other

## 2017-10-01 ENCOUNTER — Inpatient Hospital Stay: Payer: Medicare Other

## 2017-10-01 ENCOUNTER — Encounter: Payer: Self-pay | Admitting: *Deleted

## 2017-10-01 ENCOUNTER — Inpatient Hospital Stay (HOSPITAL_BASED_OUTPATIENT_CLINIC_OR_DEPARTMENT_OTHER): Payer: Medicare Other | Admitting: Hematology and Oncology

## 2017-10-01 ENCOUNTER — Encounter: Payer: Self-pay | Admitting: Genetic Counselor

## 2017-10-01 ENCOUNTER — Encounter: Payer: Self-pay | Admitting: Hematology and Oncology

## 2017-10-01 VITALS — BP 138/82 | HR 87 | Temp 96.9°F | Resp 18

## 2017-10-01 VITALS — BP 170/80 | HR 99 | Temp 99.0°F | Resp 18 | Wt 164.1 lb

## 2017-10-01 DIAGNOSIS — C50912 Malignant neoplasm of unspecified site of left female breast: Secondary | ICD-10-CM

## 2017-10-01 DIAGNOSIS — Z5111 Encounter for antineoplastic chemotherapy: Secondary | ICD-10-CM | POA: Diagnosis not present

## 2017-10-01 DIAGNOSIS — N6321 Unspecified lump in the left breast, upper outer quadrant: Secondary | ICD-10-CM

## 2017-10-01 DIAGNOSIS — E876 Hypokalemia: Secondary | ICD-10-CM

## 2017-10-01 DIAGNOSIS — E782 Mixed hyperlipidemia: Secondary | ICD-10-CM

## 2017-10-01 DIAGNOSIS — E538 Deficiency of other specified B group vitamins: Secondary | ICD-10-CM

## 2017-10-01 DIAGNOSIS — T50905A Adverse effect of unspecified drugs, medicaments and biological substances, initial encounter: Secondary | ICD-10-CM

## 2017-10-01 DIAGNOSIS — R5383 Other fatigue: Secondary | ICD-10-CM

## 2017-10-01 DIAGNOSIS — R739 Hyperglycemia, unspecified: Secondary | ICD-10-CM

## 2017-10-01 DIAGNOSIS — Z9221 Personal history of antineoplastic chemotherapy: Secondary | ICD-10-CM

## 2017-10-01 LAB — CBC WITH DIFFERENTIAL/PLATELET
Basophils Absolute: 0 10*3/uL (ref 0–0.1)
Basophils Relative: 0 %
Eosinophils Absolute: 0 10*3/uL (ref 0–0.7)
Eosinophils Relative: 0 %
HCT: 34.3 % — ABNORMAL LOW (ref 35.0–47.0)
Hemoglobin: 11.8 g/dL — ABNORMAL LOW (ref 12.0–16.0)
Lymphocytes Relative: 7 %
Lymphs Abs: 0.9 10*3/uL — ABNORMAL LOW (ref 1.0–3.6)
MCH: 31.5 pg (ref 26.0–34.0)
MCHC: 34.3 g/dL (ref 32.0–36.0)
MCV: 92 fL (ref 80.0–100.0)
Monocytes Absolute: 0.3 10*3/uL (ref 0.2–0.9)
Monocytes Relative: 2 %
Neutro Abs: 12.3 10*3/uL — ABNORMAL HIGH (ref 1.4–6.5)
Neutrophils Relative %: 91 %
Platelets: 264 10*3/uL (ref 150–440)
RBC: 3.73 MIL/uL — ABNORMAL LOW (ref 3.80–5.20)
RDW: 13.2 % (ref 11.5–14.5)
WBC: 13.5 10*3/uL — ABNORMAL HIGH (ref 3.6–11.0)

## 2017-10-01 LAB — COMPREHENSIVE METABOLIC PANEL
ALT: 21 U/L (ref 0–44)
AST: 27 U/L (ref 15–41)
Albumin: 3.8 g/dL (ref 3.5–5.0)
Alkaline Phosphatase: 85 U/L (ref 38–126)
Anion gap: 13 (ref 5–15)
BUN: 25 mg/dL — ABNORMAL HIGH (ref 8–23)
CO2: 21 mmol/L — ABNORMAL LOW (ref 22–32)
Calcium: 9.4 mg/dL (ref 8.9–10.3)
Chloride: 103 mmol/L (ref 98–111)
Creatinine, Ser: 0.92 mg/dL (ref 0.44–1.00)
GFR calc Af Amer: >60 mL/min (ref >60)
GFR calc non Af Amer: >60 mL/min (ref >60)
Glucose, Bld: 255 mg/dL — ABNORMAL HIGH (ref 70–99)
Potassium: 3.8 mmol/L (ref 3.5–5.1)
Sodium: 137 mmol/L (ref 135–145)
Total Bilirubin: 0.6 mg/dL (ref 0.3–1.2)
Total Protein: 6.8 g/dL (ref 6.5–8.1)

## 2017-10-01 LAB — LIPID PANEL
Cholesterol: 219 mg/dL — ABNORMAL HIGH (ref 0–200)
HDL: 48 mg/dL (ref >40)
LDL Cholesterol: 140 mg/dL — ABNORMAL HIGH (ref 0–99)
Total CHOL/HDL Ratio: 4.6 RATIO
Triglycerides: 156 mg/dL — ABNORMAL HIGH (ref <150)
VLDL: 31 mg/dL (ref 0–40)

## 2017-10-01 LAB — VITAMIN B12: Vitamin B-12: 1156 pg/mL — ABNORMAL HIGH (ref 180–914)

## 2017-10-01 LAB — MAGNESIUM: Magnesium: 2 mg/dL (ref 1.7–2.4)

## 2017-10-01 LAB — HEMOGLOBIN A1C
Hgb A1c MFr Bld: 6.2 % — ABNORMAL HIGH (ref 4.8–5.6)
Mean Plasma Glucose: 131.24 mg/dL

## 2017-10-01 MED ORDER — CYCLOPHOSPHAMIDE CHEMO INJECTION 1 GM
1000.0000 mg | Freq: Once | INTRAMUSCULAR | Status: DC
  Filled 2017-10-01: qty 50

## 2017-10-01 MED ORDER — SODIUM CHLORIDE 0.9 % IV SOLN
Freq: Once | INTRAVENOUS | Status: AC
  Administered 2017-10-01: 10:00:00 via INTRAVENOUS
  Filled 2017-10-01: qty 1000

## 2017-10-01 MED ORDER — SODIUM CHLORIDE 0.9 % IV SOLN
60.0000 mg/m2 | Freq: Once | INTRAVENOUS | Status: AC
  Administered 2017-10-01: 110 mg via INTRAVENOUS
  Filled 2017-10-01: qty 11

## 2017-10-01 MED ORDER — METHYLPREDNISOLONE SODIUM SUCC 125 MG IJ SOLR
125.0000 mg | Freq: Once | INTRAMUSCULAR | Status: AC | PRN
  Administered 2017-10-01: 125 mg via INTRAVENOUS

## 2017-10-01 MED ORDER — DIPHENHYDRAMINE HCL 50 MG/ML IJ SOLN
25.0000 mg | Freq: Once | INTRAMUSCULAR | Status: AC | PRN
  Administered 2017-10-01: 25 mg via INTRAVENOUS

## 2017-10-01 MED ORDER — DEXAMETHASONE SODIUM PHOSPHATE 10 MG/ML IJ SOLN
10.0000 mg | Freq: Once | INTRAMUSCULAR | Status: AC
  Administered 2017-10-01: 10 mg via INTRAVENOUS
  Filled 2017-10-01: qty 1

## 2017-10-01 MED ORDER — SODIUM CHLORIDE 0.9 % IV SOLN: 500.0000 mg/m2 | Freq: Once | INTRAVENOUS | Status: DC

## 2017-10-01 MED ORDER — FAMOTIDINE IN NACL 20-0.9 MG/50ML-% IV SOLN
20.0000 mg | Freq: Once | INTRAVENOUS | Status: AC | PRN
  Administered 2017-10-01: 20 mg via INTRAVENOUS

## 2017-10-01 MED ORDER — PALONOSETRON HCL INJECTION 0.25 MG/5ML
0.2500 mg | Freq: Once | INTRAVENOUS | Status: AC
  Administered 2017-10-01: 0.25 mg via INTRAVENOUS
  Filled 2017-10-01: qty 5

## 2017-10-01 MED ORDER — SODIUM CHLORIDE 0.9 % IV SOLN
Freq: Once | INTRAVENOUS | Status: AC | PRN
  Administered 2017-10-01: 11:00:00 via INTRAVENOUS
  Filled 2017-10-01: qty 1000

## 2017-10-01 MED ORDER — SODIUM CHLORIDE 0.9 % IV SOLN
500.0000 mg/m2 | Freq: Once | INTRAVENOUS | Status: DC
  Filled 2017-10-01: qty 46

## 2017-10-01 NOTE — Progress Notes (Signed)
  Oncology Nurse Navigator Documentation  Navigator Location: [facility] (10/01/17 1100)   )Navigator Encounter Type: Clinic/MDC (10/01/17 1100)                       Treatment Phase: Active Tx (10/01/17 1100) Barriers/Navigation Needs: Financial (10/01/17 1100)                          Time Spent with Patient: 15 (10/01/17 1100)   Met patient today prior to her chemotherapy treatment.  Needs financial assistance with a bill.  Turned bill in to [employer] for assistance.  She is to call with any questions or needs.

## 2017-10-01 NOTE — Progress Notes (Signed)
[place] [facility] day:  10/01/2017  Chief Complaint: Mariah Hogan is a 67 y.o. female with clinical stage IA left breast cancer who is seen for assessment prior to cycle #2 neoadjuvant TC.  HPI:  The patient was last seen in the medical oncology clinic on 09/24/2017 by Mariah Loh, NP.  At that time,she was feeling better after her recent hospitalization. Her energy remained low. She was sleeping a lot. No recurrent fever or diarrhea. Some nausea, but it was well controlled with the prescribed interventions. She was losing hair.  Eating and drinking better. Weight was up 4 pounds. Her chronic non-productive cough persisted, but has improved overall. Exam was unremarkable. WBC was 5300 ([place] 3200). Potassium was 2.9. BUN 16 and creatinine was 0.97 (CrCl  54.5 mL/min).   She was prescribed KDur 20 meq x 5 days.  Symptomatically, patient is doing well. She continues to improve following her recent hospital admission. Patient denies any nausea, vomiting, or changes to bowel habits. She has been seen by her PCP. Changes to diuretic therapy made (changed to Valsartan). She was taken off of meloxicam and "one of her acid reflux medications".   Patient is eating and drinking well. She has not experienced any B symptoms or recurrent infections. Patient advises that she maintains an adequate appetite. She is eating well. Weight today is 164 lb 1 oz (74.4 kg), which compared to her last visit to the clinic, represents a  5 pound weight increase.   Patient denies pain in the clinic today.   Past Medical History:  Diagnosis Date  . Arthritis   . Asthma   . Breast cancer ([place]) 08/04/2017   left breast INVASIVE DUCTAL CARCINOMA.  . Cancer ([place])    [phone] basal cell carcinoma  . GERD (gastroesophageal reflux disease)   . Hyperlipidemia   . Hypertension     Past Surgical History:  Procedure Laterality Date  . ABDOMINAL HYSTERECTOMY  1998  . bladder tack   [id number]  . BREAST BIOPSY Bilateral   . BREAST BIOPSY Left 1990  . BREAST BIOPSY Left 2018   core bx- neg  . 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  Marland Kitchen BREAST CYST EXCISION  x3  . BREAST SURGERY Left 02/13/14   Fibrocystic changes, pseudo-angiomatous stromal hyperplasia. No atypia or malignancy.  . CARPAL TUNNEL RELEASE  x2  . CHOLECYSTECTOMY  2008  . COLONOSCOPY WITH PROPOFOL N/A 01/12/2015   Procedure: COLONOSCOPY WITH PROPOFOL;  Surgeon: Mariah Silvas, MD;  Location[facility] ENDOSCOPY;  Service: Endoscopy;  Laterality: N/A;  . ESOPHAGOGASTRODUODENOSCOPY (EGD) WITH PROPOFOL N/A 01/12/2015   Procedure: ESOPHAGOGASTRODUODENOSCOPY (EGD) WITH PROPOFOL;  Surgeon: Mariah Silvas, MD;  Location: [facility] ENDOSCOPY;  Service: Endoscopy;  Laterality: N/A;  . NASAL RECONSTRUCTION  1983  . SAVORY DILATION N/A 01/12/2015   Procedure: SAVORY DILATION;  Surgeon: Mariah Silvas, MD;  Location: [facility] ENDOSCOPY;  Service: Endoscopy;  Laterality: N/A;  . TONSILLECTOMY AND ADENOIDECTOMY  1956    Family History  Problem Relation Age of Onset  . Breast cancer Sister 64       currently 71  . Breast cancer Paternal Grandmother 27       bilateral; deceased 72s   . Breast cancer Cousin 42       daughter of an unaffected maternal aunt  . Breast cancer Cousin 27       kidney cancer also; daughter of an unaffected maternal  aunt  . Other Mother        TAH/BSO prior to menopause  . Prostate cancer Paternal Grandfather        age at dx unknown  . Breast cancer Maternal Aunt        age at dx unknown  . Breast cancer Cousin 52       daughter of an unaffected maternal aunt    Social History:  reports that she has never smoked. She has never used smokeless tobacco. She reports that she does not drink alcohol or use drugs.  Her daughter is Mariah Hogan, [place]. She is a retired Customer service manager for 35 years. Patient denies known exposures to radiation on toxins. Her daughter's name is  Mariah Hogan.  She is alone today.  Allergies:  Allergies  Allergen Reactions  . Penicillin G Shortness Of Breath, Swelling and Rash    Has patient had a PCN reaction causing immediate rash, facial/tongue/throat swelling, SOB or lightheadedness with hypotension: Yes Has patient had a PCN reaction causing severe rash involving mucus membranes or skin necrosis: No Has patient had a PCN reaction that required hospitalization: No Has patient had a PCN reaction occurring within the last 10 years: No If all of the above answers are "NO", then may proceed with Cephalosporin use.  Marland Kitchen Fluocinonide   . Other Other (See Comments)    Dodecyl gallate: Positive patch test  Elta MD UV Clear SPF 46: Positive patch test  fragrance dodocylgallate dodocylgallate  . Parabens Other (See Comments)    Positive patch test   . Shellac   . Triamcinolone Acetonide   . Latex Rash    rash  . Triamcinolone Rash    Current Medications: Current Outpatient Medications  Medication Sig Dispense Refill  . albuterol (PROVENTIL HFA;VENTOLIN HFA) 108 (90 Base) MCG/ACT inhaler Inhale 2 puffs into the lungs every 6 (six) hours as needed for wheezing or shortness of breath. 1 Inhaler 1  . atenolol (TENORMIN) 50 MG tablet Take 50 mg by mouth daily.    . Cholecalciferol 1000 UNITS tablet Take 1,000 Units by mouth daily.    . citalopram (CELEXA) 20 MG tablet Take 20 mg by mouth daily.    Marland Kitchen dexamethasone (DECADRON) 4 MG tablet Take 2 tablets (8 mg total) by mouth 2 (two) times daily. Start the day before Taxotere. Then again the day after chemo for 3 days. 30 tablet 1  . pantoprazole (PROTONIX) 40 MG tablet Take 40 mg by mouth daily.    . prednisoLONE (ORAPRED) 15 MG/5ML solution Take 5-10 mLs by mouth 4 (four) times daily.    . valACYclovir (VALTREX) 500 MG tablet Take 500 mg by mouth daily.    . valsartan (DIOVAN) 80 MG tablet Take by mouth.    . loratadine (CLARITIN) 10 MG tablet Take 10 mg by mouth at bedtime.     Marland Kitchen  LORazepam (ATIVAN) 0.5 MG tablet Take 1 tablet (0.5 mg total) by mouth every 6 (six) hours as needed (Nausea or vomiting). (Patient not taking: Reported on 10/01/2017) 30 tablet 0  . ondansetron (ZOFRAN) 8 MG tablet Take 1 tablet (8 mg total) by mouth 2 (two) times daily as needed for refractory nausea / vomiting. Start on day 3 after chemo. (Patient not taking: Reported on 10/01/2017) 30 tablet 1   No current facility-administered medications for this visit.     Review of Systems  Constitutional: Positive for malaise/fatigue. Negative for diaphoresis, fever and weight loss (weight up 5 pounds).       "  Im feeling better". Sleeping a lot.   HENT: Negative.   Eyes: Negative.   Respiratory: Positive for cough (chronic - improved) and shortness of breath (exertional). Negative for hemoptysis and sputum production.   Cardiovascular: Positive for palpitations (intermittent). Negative for chest pain, orthopnea, leg swelling and PND.  Gastrointestinal: Positive for nausea (intermiitent - controlled with interventions). Negative for abdominal pain, blood in stool, constipation, diarrhea, melena and vomiting.  Genitourinary: Negative for dysuria, frequency, hematuria and urgency.  Musculoskeletal: Positive for back pain and joint pain (hands and feet). Negative for falls and myalgias.  Skin: Negative for itching and rash.       PMH (+) for BCC to face and leg.  Neurological: Positive for weakness (generalized). Negative for dizziness, tremors and headaches.  Endo/Heme/Allergies: Does not bruise/bleed easily.  Psychiatric/Behavioral: Negative for depression, memory loss and suicidal ideas. The patient is not nervous/anxious and does not have insomnia (+) HYPERsomnia.   All other systems reviewed and are negative.  Performance status (ECOG): 1 - Symptomatic but completely ambulatory  Vital Signs BP (!) 170/80 (BP Location: Left Arm, Patient Position: Sitting)   Pulse 99   Temp 99 F (37.2 C)  (Tympanic)   Resp 18   Wt 164 lb 1 oz (74.4 kg)   BMI 27.30 kg/m   Physical Exam  Constitutional: She is oriented to person, place, and time and well-developed, well-nourished, and in no distress.  HENT:  Head: Normocephalic and atraumatic.  Long brown wig.  Alopecia.  Eyes: Pupils are equal, round, and reactive to light. EOM are normal. No scleral icterus.  Blue eyes  Neck: Normal range of motion. Neck supple. No tracheal deviation present. No thyromegaly present.  Cardiovascular: Normal rate, regular rhythm and normal heart sounds. Exam reveals no gallop and no friction rub.  No murmur heard. Pulmonary/Chest: Effort normal and breath sounds normal. No respiratory distress. She has no wheezes. She has no rales.  Intermittent cough.  Abdominal: Soft. Bowel sounds are normal. She exhibits no distension. There is no tenderness.  Musculoskeletal: Normal range of motion. She exhibits no edema or tenderness.  Lymphadenopathy:    She has no cervical adenopathy.    She has no axillary adenopathy.       Right: No inguinal and no supraclavicular adenopathy present.       Left: No inguinal and no supraclavicular adenopathy present.  Neurological: She is alert and oriented to person, place, and time.  Skin: Skin is warm and dry. No rash noted. No erythema.  Psychiatric: Mood, affect and judgment normal.  Nursing note and vitals reviewed.   Appointment on 10/01/2017  Component Date Value Ref Range Status  . Sodium 10/01/2017 137  135 - 145 mmol/L Final  . Potassium 10/01/2017 3.8  3.5 - 5.1 mmol/L Final  . Chloride 10/01/2017 103  98 - 111 mmol/L Final  . CO2 10/01/2017 21* 22 - 32 mmol/L Final  . Glucose, Bld 10/01/2017 255* 70 - 99 mg/dL Final  . BUN 10/01/2017 25* 8 - 23 mg/dL Final  . Creatinine, Ser 10/01/2017 0.92  0.44 - 1.00 mg/dL Final  . Calcium 10/01/2017 9.4  8.9 - 10.3 mg/dL Final  . Total Protein 10/01/2017 6.8  6.5 - 8.1 g/dL Final  . Albumin 10/01/2017 3.8  3.5 - 5.0  g/dL Final  . AST 10/01/2017 27  15 - 41 U/L Final  . ALT 10/01/2017 21  0 - 44 U/L Final  . Alkaline Phosphatase 10/01/2017 85  38 - 126 U/L Final  .  Total Bilirubin 10/01/2017 0.6  0.3 - 1.2 mg/dL Final  . GFR calc non Af Amer 10/01/2017 >60  >60 mL/min Final  . GFR calc Af Amer 10/01/2017 >60  >60 mL/min Final   Comment: (NOTE) The eGFR has been calculated using the CKD EPI equation. This calculation has not been validated in all clinical situations. eGFR's persistently <60 mL/min signify possible Chronic Kidney Disease.   Georgiann Hahn gap 10/01/2017 13  5 - 15 Final   Performed at [facility], [address] [place], [place] [address]  . WBC 10/01/2017 13.5* 3.6 - 11.0 K/uL Final  . RBC 10/01/2017 3.73* 3.80 - 5.20 MIL/uL Final  . Hemoglobin 10/01/2017 11.8* 12.0 - 16.0 g/dL Final  . HCT 10/01/2017 34.3* 35.0 - 47.0 % Final  . MCV 10/01/2017 92.0  80.0 - 100.0 fL Final  . MCH 10/01/2017 31.5  26.0 - 34.0 pg Final  . MCHC 10/01/2017 34.3  32.0 - 36.0 g/dL Final  . RDW 10/01/2017 13.2  11.5 - 14.5 % Final  . Platelets 10/01/2017 264  150 - 440 K/uL Final  . Neutrophils Relative % 10/01/2017 91  % Final  . Neutro Abs 10/01/2017 12.3* 1.4 - 6.5 K/uL Final  . Lymphocytes Relative 10/01/2017 7  % Final  . Lymphs Abs 10/01/2017 0.9* 1.0 - 3.6 K/uL Final  . Monocytes Relative 10/01/2017 2  % Final  . Monocytes Absolute 10/01/2017 0.3  0.2 - 0.9 K/uL Final  . Eosinophils Relative 10/01/2017 0  % Final  . Eosinophils Absolute 10/01/2017 0.0  0 - 0.7 K/uL Final  . Basophils Relative 10/01/2017 0  % Final  . Basophils Absolute 10/01/2017 0.0  0 - 0.1 K/uL Final   Performed at [facility], [address] [place], [place] [address]  . Magnesium 10/01/2017 2.0  1.7 - 2.4 mg/dL Final   Performed at [facility], [address] [place], [place] [address]    Assessment:  Mariah Hogan is a 67 y.o. female with clinical stage IA left breast cancer s/p biopsy on  08/04/2017.  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  Tumor was ER + (95%), PR+ (20%), and Her 2 neu - by FISH.  Ki67 was 80%.  CA27.29 was 17.3 on 08/06/2017.  MammaPrint was high risk luminal-type B with a 10 year risk of recurrence untreated of 29% and 94.6% of distant metastasis free interval with chemotherapy + hormonal therapy.  Left breast ultrasound on 08/04/2017 revealed a 1.65 x 1.69 x 1.86 cm multi-lobulated hypoechoic mass in the upper outer quadrant of the left breast in the 2 o'clock position 7 cm from the nipple.  Bilateral breast MRI on 08/20/2017 revealed a 2.4 cm enhancing irregular mass in the outer left breast.  There were several prominent lymph nodes with borderline thicked cortices between 3-4 mm, but normal morphology.  There was a 5 mm enhancing mass in the anterior, retroareolar region of the right breast  RIGHT breast retroareolar biopsy on 09/02/2017 revealed no evidence of malignancy.  There was fibrocystic change and usual ductal hyperplasia.  Chest CT on 09/23/2016 revealed a 4 mm RUL subpleural nodule.  Chest CT on 08/17/2017 revealed a small 4 mm RUL nodule unchanged in size.  She received neoadjuvant Femara (08/12/2017 - 08/31/2017) secondary to planned a delay in surgery (mastectomy with immediate reconstruction).  Femara was discontinued after high risk Mammaprint returned.  She is s/p cycle #1 neoadjuvant Taxotere and Cytoxan (09/03/2017).  Bone  density on 08/17/2017 revealed osteopenia with a T-score of -1.5 in the left femoral neck and -0.7 in the AP spine L1-L4.  She has a significant family history of breast cancer (paternal grandmother, sister, and 3 maternal cousins).   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  She was admitted to [facility] from 09/11/2017 - 09/15/2017 with enteropathogenic E.Coli (EPEC) and associated acute renal failure  (creatinine 1.87; CrCl 28.3 ml/min). She was treated with intravenous azithromycin and ceftriaxone.   Symptomatically, her energy remains low. She is sleeping a lot. No recurrent fever or diarrhea. She has some nausea, but it is well controlled with the prescribed interventions. She is eating and drinking better. Weight up 5 pounds. Non-productive cough persists, but has improved overall. Exam is unremarkable. WBC 13,500 (ANC 12,300) post steroids. Potassium is 3.8. BUN 25 and creatinine 0.92 (Crcl  58.3 mL/min).   Plan: 1. Labs today: CBC with diff, CMP, Mg. 2. Breast cancer: Discuss plan for dose reduction of chemotherapy, close monitoring, IVF if needed, aggressive management of nausea. Cycle #2 TC today at 80% dosing.  Patient is in agreement.  RTC tomorrow for [employer]. 3.   RTC on 10/05/2017 for  +/- IVFs 4.   RTC on 10/08/2017 for MD assessment, labs (CBC with diff, CMP, Mg). 5.  RTC on 10/22/2017 for MD assessment, labs (CBC with diff, CMP, Mg) and cycle #3 TC chemotherapy with Udencya support the following day.    Addendum:  Early into the Taxotere infusion, patient developed back pain and spasms.  She then developed shortness of breath and shaking.  Taxotere was stopped.  Benadryl, Solumedrol, and Pepcid were given.  Infusion was discontinued permanently.  She did not receive any Cytoxan.  Udencya cancelled for 10/02/2017.   Mariah Loh, NP  10/01/2017, 9:35 AM   I saw and evaluated the patient, participating in the key portions of the service and reviewing pertinent diagnostic studies and records.  I reviewed the nurse practitioner's note and agree with the findings and the plan.  The assessment and plan were discussed with the patient.  Multiple questions were asked by the patient and answered.   Nolon Stalls, MD 10/01/2017,9:35 AM

## 2017-10-01 NOTE — Progress Notes (Signed)
1059- Patient states, "I am having back pain and back spasms that just started to get really bad. The spasms are causing me to shake. I am also feeling short of breath and having a hard time catching my breath." Taxotere stopped. 0.9% Sodium Chloride infusing. Vital signs stable, see flow sheet. NP, Beckey Rutter, notified and present at chair side by 1101. Benadryl, Solu-medrol, and Pepcid given, see MAR.   3403- Patient states, "I am feeling better. I only have a little back pain and spasms now. I am breathing normally now." NP, Beckey Rutter, notified Dr. Mike Gip of Taxotere reaction. Per Dr. Mike Gip order: hold Taxotere and Cytoxan treatment for today. Continue to monitor patient until all symptoms resolved.  1135- Patient states, "I feel much better. I do not have any further back pain or spasms. I feel almost back to my normal, but not completely yet." Will continue to monitor. Per MD, Dr. Mike Gip, order: hold/do not give Neulasta injection on 10/02/17. Also, cancel appt for Monday 10/05/17.   1315- Patient states, "I feel better." Patient does not have any further signs or symptoms of reaction. Dr. Mike Gip present at chair side. Per MD order: patient may be discharged to home at this time. Patient to return to clinic next week, 10/09/17. Vital signs stable prior to discharge.

## 2017-10-01 NOTE — Progress Notes (Signed)
Patient stated they had bad reaction to cytoxan and was hospitalized.  Doses reduced per MD.  Wants stated doses entered.

## 2017-10-01 NOTE — Progress Notes (Signed)
Nutrition Follow-up:  Patient with breast cancer currently undergoing neoadjuvant chemotherapy.  Chart reviewed and noted recent hospital admission (diarrhea +ecoli).    Met with patient during infusion today as patient had left message on RD voice mail.  When RD got to infusion patient requesting to have visit at another time due to just having a reaction to chemotherapy.  Reports that she is feeling better now and after recent hospital visit.    Medications: reviewed  Labs: reviewed, noted glucose, cholesterol, LDL  Anthropometrics:   Weight stable at 164 lb today.  Noted weight loss but now back to weight of 164 lb same as on 6/27 last RD visit   NUTRITION DIAGNOSIS: Food and nutrition knowledge deficit improved   MALNUTRITION DIAGNOSIS: none   INTERVENTION:  Will certainly honor patient's request and meet with her at a later date.  Patient has RD contact information and can contact me if needed    MONITORING, EVALUATION, GOAL: weight trends, intake   NEXT VISIT: August 15 during infusion  Mariah Hogan, [place], [place] Registered Dietitian [phone] (pager)

## 2017-10-01 NOTE — Progress Notes (Signed)
Pt in for follow up, reports 'doing well".  No diarrhea, thrush had cleared, medication changes per PCP updated in med list.

## 2017-10-02 ENCOUNTER — Inpatient Hospital Stay: Payer: Medicare Other

## 2017-10-05 ENCOUNTER — Inpatient Hospital Stay: Payer: Medicare Other

## 2017-10-08 NOTE — Progress Notes (Signed)
[facility] day:  10/09/2017  Chief Complaint: Mariah Hogan is a 67 y.o. female with clinical stage IA left breast cancer who is seen for reassessment following reaction to Taxotere and discussion regarding direction of therapy.  HPI:  The patient was last seen in the medical oncology clinic on 10/01/2017.  At that time, patient was doing well overall. He continued to improve following hospitalization for EPEC (+) diarrhea. Antihypertensive medication had been changed from HCTZ to Valsartan due to resulting HYPOkalemia. Eating well; weight up 5 pounds. She had a continued non-productive cough. No fevers. Exam was stable. WBC 5300 (Ruby 3200). Potassium had improved to 3.8 following 5 day course of oral replacement therapy. B12 level elevated at 1156 (180-914 pg/mL).  Patient experienced a reaction to her cycle #2 TC chemotherapy. Approximately 15 minutes after starting her docetaxel infusion, patient developed significant back pain, shortness of breath, palpitations, and began to shake. Medication was stopped and fluid bolus was administered. Patient was medicated with intravenous diphenhydramine 50 mg, methylprednisolone 125 mg, and famotidine 20 mg. Symptoms fully resolved within a 10 minute period. Vital signs monitored and reviewed as stable. Patient was observed in the infusion center for approximately 2 hours, during which time she was seen by primary oncologist and symptom management NP, prior to being discharged home in stable condition.   Cancer center RD attempted to meet with patient in infusion on 10/01/2017, however due to docetaxel reaction, the patient asked to defer visit until another time. RD reviewed documentation and noted stable weights. RD to continue to follow.   In the interim, patient continues to have chronic cough for over a year, however she notes that it has been worse over the last week or so. Cough is non-productive. She denies  fevers. Patient is fatigued. She denies nausea, vomiting, and significant bowel changes. Patient denies that she has experienced any B symptoms. She denies any interval infections.   Patient advises that she maintains an adequate appetite. She is eating well. Weight today is 163 lb 1 oz (74 kg), which compared to her last visit to the clinic, represents a 1 pound decrease.   Patient denies pain in the clinic today.   Past Medical History:  Diagnosis Date  . Arthritis   . Asthma   . Breast cancer ([place]) 08/04/2017   left breast INVASIVE DUCTAL CARCINOMA.  . Cancer ([place])    [id number] basal cell carcinoma  . GERD (gastroesophageal reflux disease)   . Hyperlipidemia   . Hypertension     Past Surgical History:  Procedure Laterality Date  . ABDOMINAL HYSTERECTOMY  1998  . bladder tack  [id number]  . BREAST BIOPSY Bilateral   . BREAST BIOPSY Left 1990  . BREAST BIOPSY Left 2018   core bx- neg  . 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  Marland Kitchen BREAST CYST EXCISION  x3  . BREAST SURGERY Left 02/13/14   Fibrocystic changes, pseudo-angiomatous stromal hyperplasia. No atypia or malignancy.  . CARPAL TUNNEL RELEASE  x2  . CHOLECYSTECTOMY  2008  . COLONOSCOPY WITH PROPOFOL N/A 01/12/2015   Procedure: COLONOSCOPY WITH PROPOFOL;  Surgeon: Manya Silvas, MD;  Location: [facility] ENDOSCOPY;  Service: Endoscopy;  Laterality: N/A;  . ESOPHAGOGASTRODUODENOSCOPY (EGD) WITH PROPOFOL N/A 01/12/2015   Procedure: ESOPHAGOGASTRODUODENOSCOPY (EGD) WITH PROPOFOL;  Surgeon: Manya Silvas, MD;  Location: [facility] ENDOSCOPY;  Service: Endoscopy;  Laterality: N/A;  . NASAL RECONSTRUCTION  1983  .  SAVORY DILATION N/A 01/12/2015   Procedure: SAVORY DILATION;  Surgeon: Manya Silvas, MD;  Location: [facility] ENDOSCOPY;  Service: Endoscopy;  Laterality: N/A;  . TONSILLECTOMY AND ADENOIDECTOMY  1956    Family History  Problem Relation Age of Onset  . Breast cancer Sister 45        currently 17  . Breast cancer Paternal Grandmother 60       bilateral; deceased 100s   . Breast cancer Cousin 56       daughter of an unaffected maternal aunt  . Breast cancer Cousin 60       kidney cancer also; daughter of an unaffected maternal aunt  . Other Mother        TAH/BSO prior to menopause  . Prostate cancer Paternal Grandfather        age at dx unknown  . Breast cancer Maternal Aunt        age at dx unknown  . Breast cancer Cousin 31       daughter of an unaffected maternal aunt    Social History:  reports that she has never smoked. She has never used smokeless tobacco. She reports that she does not drink alcohol or use drugs.  Her daughter, [name], is a gastroenterology PA. She is a retired Customer service manager for 35 years. Patient denies known exposures to radiation on toxins. She is alone today.  Allergies:  Allergies  Allergen Reactions  . Penicillin G Shortness Of Breath, Swelling and Rash    Has patient had a PCN reaction causing immediate rash, facial/tongue/throat swelling, SOB or lightheadedness with hypotension: Yes Has patient had a PCN reaction causing severe rash involving mucus membranes or skin necrosis: No Has patient had a PCN reaction that required hospitalization: No Has patient had a PCN reaction occurring within the last 10 years: No If all of the above answers are "NO", then may proceed with Cephalosporin use.  [name] [Docetaxel] Shortness Of Breath    Back pain Back Spasms  . Fluocinonide   . Other Other (See Comments)    Dodecyl gallate: Positive patch test  Elta MD UV Clear SPF 46: Positive patch test  fragrance dodocylgallate dodocylgallate  . Parabens Other (See Comments)    Positive patch test   . Shellac   . Triamcinolone Acetonide   . Latex Rash    rash  . Triamcinolone Rash    Current Medications: Current Outpatient Medications  Medication Sig Dispense Refill  . albuterol (PROVENTIL HFA;VENTOLIN HFA) 108 (90 Base) MCG/ACT  inhaler Inhale 2 puffs into the lungs every 6 (six) hours as needed for wheezing or shortness of breath. 1 Inhaler 1  . atenolol (TENORMIN) 50 MG tablet Take 50 mg by mouth daily.    . Cholecalciferol 1000 UNITS tablet Take 1,000 Units by mouth daily.    . citalopram (CELEXA) 20 MG tablet Take 20 mg by mouth daily.    Marland Kitchen dexamethasone (DECADRON) 4 MG tablet Take 2 tablets (8 mg total) by mouth 2 (two) times daily. Start the day before Taxotere. Then again the day after chemo for 3 days. 30 tablet 1  . pantoprazole (PROTONIX) 40 MG tablet Take 40 mg by mouth daily.    . prednisoLONE (ORAPRED) 15 MG/5ML solution Take 5-10 mLs by mouth 4 (four) times daily.    . valACYclovir (VALTREX) 500 MG tablet Take 500 mg by mouth daily.    . valsartan (DIOVAN) 80 MG tablet Take by mouth.    . loratadine (CLARITIN) 10  MG tablet Take 10 mg by mouth at bedtime.     Marland Kitchen LORazepam (ATIVAN) 0.5 MG tablet Take 1 tablet (0.5 mg total) by mouth every 6 (six) hours as needed (Nausea or vomiting). (Patient not taking: Reported on 10/01/2017) 30 tablet 0  . ondansetron (ZOFRAN) 8 MG tablet Take 1 tablet (8 mg total) by mouth 2 (two) times daily as needed for refractory nausea / vomiting. Start on day 3 after chemo. (Patient not taking: Reported on 10/01/2017) 30 tablet 1   No current facility-administered medications for this visit.     Review of Systems  Constitutional: Positive for malaise/fatigue and weight loss (down 1 pound). Negative for diaphoresis and fever.  HENT: Negative.   Eyes: Negative.   Respiratory: Positive for cough (chronic) and shortness of breath (exertional). Negative for hemoptysis and sputum production.   Cardiovascular: Positive for palpitations (intermittent). Negative for chest pain, orthopnea, leg swelling and PND.  Gastrointestinal: Positive for nausea (intermittent - controlled with currently prescribed interventions). Negative for abdominal pain, blood in stool, constipation, diarrhea, melena  and vomiting.  Genitourinary: Negative for dysuria, frequency, hematuria and urgency.  Musculoskeletal: Positive for back pain and joint pain (hands and feet). Negative for falls and myalgias.  Skin: Negative for itching and rash.  Neurological: Positive for weakness (generalized). Negative for dizziness, tremors and headaches.  Endo/Heme/Allergies: Does not bruise/bleed easily.  Psychiatric/Behavioral: Negative for depression, memory loss and suicidal ideas. The patient is not nervous/anxious and does not have insomnia.        (+) HYPERsomnia  All other systems reviewed and are negative.  Performance status (ECOG): 1 - Symptomatic but completely ambulatory  Vital Signs BP (!) 154/81 (BP Location: Left Arm, Patient Position: Sitting)   Pulse 71   Temp 99.2 F (37.3 C) (Tympanic)   Resp 18   Wt 163 lb 1 oz (74 kg)   BMI 27.14 kg/m   Physical Exam  Constitutional: She is oriented to person, place, and time and well-developed, well-nourished, and in no distress.  HENT:  Head: Normocephalic and atraumatic.  Shoulder length brown wig.  Alopecia.  Eyes: Pupils are equal, round, and reactive to light. EOM are normal. No scleral icterus.  Blue eyes.   Neck: Normal range of motion. Neck supple. No tracheal deviation present. No thyromegaly present.  Cardiovascular: Normal rate, regular rhythm and normal heart sounds. Exam reveals no gallop and no friction rub.  No murmur heard. Pulmonary/Chest: Effort normal and breath sounds normal. No respiratory distress. She has no wheezes. She has no rales.  Abdominal: Soft. She exhibits no distension. There is no tenderness.  Musculoskeletal: Normal range of motion. She exhibits no edema or tenderness.  Lymphadenopathy:    She has no cervical adenopathy.    She has no axillary adenopathy.       Right: No inguinal and no supraclavicular adenopathy present.       Left: No inguinal and no supraclavicular adenopathy present.  Neurological: She is  alert and oriented to person, place, and time. Gait normal.  Skin: Skin is warm. No rash noted. No erythema.  Psychiatric: Mood, affect and judgment normal.  Nursing note and vitals reviewed.   Orders Only on 10/09/2017  Component Date Value Ref Range Status  . Magnesium 10/09/2017 2.1  1.7 - 2.4 mg/dL Final   Performed at [facility], [address] [place], [place] [address]  . Sodium 10/09/2017 136  135 - 145 mmol/L Final  . Potassium 10/09/2017 4.2  3.5 - 5.1  mmol/L Final  . Chloride 10/09/2017 102  98 - 111 mmol/L Final  . CO2 10/09/2017 26  22 - 32 mmol/L Final  . Glucose, Bld 10/09/2017 116* 70 - 99 mg/dL Final  . BUN 10/09/2017 20  8 - 23 mg/dL Final  . Creatinine, Ser 10/09/2017 1.00  0.44 - 1.00 mg/dL Final  . Calcium 10/09/2017 9.0  8.9 - 10.3 mg/dL Final  . Total Protein 10/09/2017 6.3* 6.5 - 8.1 g/dL Final  . Albumin 10/09/2017 3.5  3.5 - 5.0 g/dL Final  . AST 10/09/2017 16  15 - 41 U/L Final  . ALT 10/09/2017 17  0 - 44 U/L Final  . Alkaline Phosphatase 10/09/2017 80  38 - 126 U/L Final  . Total Bilirubin 10/09/2017 0.9  0.3 - 1.2 mg/dL Final  . GFR calc non Af Amer 10/09/2017 56* >60 mL/min Final  . GFR calc Af Amer 10/09/2017 >60  >60 mL/min Final   Comment: (NOTE) The eGFR has been calculated using the CKD EPI equation. This calculation has not been validated in all clinical situations. eGFR's persistently <60 mL/min signify possible Chronic Kidney Disease.   Georgiann Hahn gap 10/09/2017 8  5 - 15 Final   Performed at [facility], [address] [place], [place] [address]  . WBC 10/09/2017 5.9  3.6 - 11.0 K/uL Final  . RBC 10/09/2017 3.83  3.80 - 5.20 MIL/uL Final  . Hemoglobin 10/09/2017 12.3  12.0 - 16.0 g/dL Final  . HCT 10/09/2017 35.9  35.0 - 47.0 % Final  . MCV 10/09/2017 93.8  80.0 - 100.0 fL Final  . MCH 10/09/2017 32.1  26.0 - 34.0 pg Final  . MCHC 10/09/2017 34.2  32.0 - 36.0 g/dL Final  . RDW 10/09/2017 13.5  11.5 - 14.5 % Final  .  Platelets 10/09/2017 191  150 - 440 K/uL Final  . Neutrophils Relative % 10/09/2017 67  % Final  . Neutro Abs 10/09/2017 4.0  1.4 - 6.5 K/uL Final  . Lymphocytes Relative 10/09/2017 19  % Final  . Lymphs Abs 10/09/2017 1.1  1.0 - 3.6 K/uL Final  . Monocytes Relative 10/09/2017 10  % Final  . Monocytes Absolute 10/09/2017 0.6  0.2 - 0.9 K/uL Final  . Eosinophils Relative 10/09/2017 3  % Final  . Eosinophils Absolute 10/09/2017 0.2  0 - 0.7 K/uL Final  . Basophils Relative 10/09/2017 1  % Final  . Basophils Absolute 10/09/2017 0.0  0 - 0.1 K/uL Final   Performed at [facility], [address] [place],  [address]    Assessment:  Mariah Hogan is a 67 y.o. female with clinical stage IA left breast cancer s/p biopsy on 08/04/2017.  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  Tumor was ER + (95%), PR+ (20%), and Her 2 neu - by FISH.  Ki67 was 80%.  CA27.29 was 17.3 on 08/06/2017.  MammaPrint was high risk luminal-type B with a 10 year risk of recurrence untreated of 29% and 94.6% of distant metastasis free interval with chemotherapy + hormonal therapy.  Left breast ultrasound on 08/04/2017 revealed a 1.65 x 1.69 x 1.86 cm multi-lobulated hypoechoic mass in the upper outer quadrant of the left breast in the 2 o'clock position 7 cm from the nipple.  Bilateral breast MRI on 08/20/2017 revealed a 2.4 cm enhancing irregular mass in the outer left breast.  There were several prominent lymph nodes with borderline thicked cortices between 3-4 mm, but normal  morphology.  There was a 5 mm enhancing mass in the anterior, retroareolar region of the right breast  RIGHT breast retroareolar biopsy on 09/02/2017 revealed no evidence of malignancy.  There was fibrocystic change and usual ductal hyperplasia.  Chest CT on 09/23/2016 revealed a 4 mm RUL subpleural nodule.  Chest CT on 08/17/2017 revealed a small 4 mm RUL nodule unchanged in size.  She received  neoadjuvant Femara (08/12/2017 - 08/31/2017) secondary to planned a delay in surgery (mastectomy with immediate reconstruction).  Femara was discontinued after high risk Mammaprint returned.  She is s/p cycle #1 neoadjuvant Taxotere and Cytoxan (09/03/2017). Cycle #2 on 10/01/2017 was truncated after approximately 15 minutes of Taxotere infusion due to a reaction (back pain, SOB, shaking).  Bone density on 08/17/2017 revealed osteopenia with a T-score of -1.5 in the left femoral neck and -0.7 in the AP spine L1-L4.  She has a significant family history of breast cancer (paternal grandmother, sister, and 3 maternal cousins).   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  She was admitted to [facility] from 09/11/2017 - 09/15/2017 with enteropathogenic E.Coli (EPEC) and associated acute renal failure (creatinine 1.87; CrCl 28.3 ml/min). She was treated with intravenous azithromycin and ceftriaxone.   Symptomatically, she is fatigued. She continues to experience a chronic cough, however notes that she feels as if it is worsening. No sputum production or fevers. Nausea and pain are well controlled with prescribed interventions. Appetite is "ok"; weight down 1 pound. Exam is unremarkable. WBC 5900 (Cameron 4000). Potassium 4.2. BUN 20 and creatinine 1.00 (Crcl  53.10m/min).   Plan: 1. Labs today: CBC with diff, CMP, Mg 2. LEFT breast cancer - treatment ongoing  Discuss docetaxel reaction. Patient developed back pain, shortness of breath, palpitations, and shaking following approximately 15 minutes of infusion. Medication discontinued.    [facility]. Side effects reviewed. Patient provided with printed information today on her AVS for review at home. Patient encouraged to make a list of questions and/or concerns for further discussion prior to beginning therapy using this  medication.   [facility]  [facility] declines.  Will contact [facility] for phone consultation.  Discuss need for pre-treatment echocardiogram to assess LV and valvular function prior to anticipated treatment with doxorubicin. Will proceed with scheduling.   Discuss need for venous access device (port) placement. She is an established patient with Dr. BBary Castilla Wishes to hold off until after speaking with her daughter.  Discuss symptom management.  Patient has antiemetics and pain medications at home to use on a PRN basis. Patient  advising that the  prescribed interventions are adequate at this point. Continue all medications as previously prescribed.  3. HYPOkalemia - stable  Potassium level is 4.2 today. Her hypokalemia has improved with the change in her antihypertensive to Valsartan. Will continue routine potassium monitoring. 4. Cough - chronic  Continues to have chronic non-productive cough. No pleuritic chest pain. (+) exertional shortness of breath. No fevers. Encouraged to follow up with PCP.  5. RTC will be based on patient's decision to proceed with recommended regimen. Patient to call for appointment after speaking with her daughter.    BHonor Loh NP  10/09/2017, 10:16 AM   I saw and evaluated the patient, participating in the key portions of the service and reviewing pertinent diagnostic studies and records.  I reviewed the  nurse practitioner's note and agree with the findings and the plan.  The assessment and plan were discussed with the patient.  Multiple questions were asked by the patient and answered.   Nolon Stalls, MD 10/09/2017,10:16 AM

## 2017-10-09 ENCOUNTER — Other Ambulatory Visit: Payer: Self-pay

## 2017-10-09 ENCOUNTER — Inpatient Hospital Stay: Payer: Medicare Other | Attending: Hematology and Oncology

## 2017-10-09 ENCOUNTER — Inpatient Hospital Stay (HOSPITAL_BASED_OUTPATIENT_CLINIC_OR_DEPARTMENT_OTHER): Payer: Medicare Other | Admitting: Hematology and Oncology

## 2017-10-09 VITALS — BP 154/81 | HR 71 | Temp 99.2°F | Resp 18 | Wt 163.1 lb

## 2017-10-09 DIAGNOSIS — Z5111 Encounter for antineoplastic chemotherapy: Secondary | ICD-10-CM | POA: Insufficient documentation

## 2017-10-09 DIAGNOSIS — C50912 Malignant neoplasm of unspecified site of left female breast: Secondary | ICD-10-CM

## 2017-10-09 DIAGNOSIS — E876 Hypokalemia: Secondary | ICD-10-CM

## 2017-10-09 DIAGNOSIS — Z5189 Encounter for other specified aftercare: Secondary | ICD-10-CM | POA: Insufficient documentation

## 2017-10-09 DIAGNOSIS — Z9221 Personal history of antineoplastic chemotherapy: Secondary | ICD-10-CM | POA: Insufficient documentation

## 2017-10-09 DIAGNOSIS — Z01818 Encounter for other preprocedural examination: Secondary | ICD-10-CM

## 2017-10-09 DIAGNOSIS — Z7189 Other specified counseling: Secondary | ICD-10-CM

## 2017-10-09 LAB — CBC WITH DIFFERENTIAL/PLATELET
Basophils Absolute: 0 10*3/uL (ref 0–0.1)
Basophils Relative: 1 %
Eosinophils Absolute: 0.2 10*3/uL (ref 0–0.7)
Eosinophils Relative: 3 %
HCT: 35.9 % (ref 35.0–47.0)
Hemoglobin: 12.3 g/dL (ref 12.0–16.0)
Lymphocytes Relative: 19 %
Lymphs Abs: 1.1 10*3/uL (ref 1.0–3.6)
MCH: 32.1 pg (ref 26.0–34.0)
MCHC: 34.2 g/dL (ref 32.0–36.0)
MCV: 93.8 fL (ref 80.0–100.0)
Monocytes Absolute: 0.6 10*3/uL (ref 0.2–0.9)
Monocytes Relative: 10 %
Neutro Abs: 4 10*3/uL (ref 1.4–6.5)
Neutrophils Relative %: 67 %
Platelets: 191 10*3/uL (ref 150–440)
RBC: 3.83 MIL/uL (ref 3.80–5.20)
RDW: 13.5 % (ref 11.5–14.5)
WBC: 5.9 10*3/uL (ref 3.6–11.0)

## 2017-10-09 LAB — COMPREHENSIVE METABOLIC PANEL
ALT: 17 U/L (ref 0–44)
AST: 16 U/L (ref 15–41)
Albumin: 3.5 g/dL (ref 3.5–5.0)
Alkaline Phosphatase: 80 U/L (ref 38–126)
Anion gap: 8 (ref 5–15)
BUN: 20 mg/dL (ref 8–23)
CO2: 26 mmol/L (ref 22–32)
Calcium: 9 mg/dL (ref 8.9–10.3)
Chloride: 102 mmol/L (ref 98–111)
Creatinine, Ser: 1 mg/dL (ref 0.44–1.00)
GFR calc Af Amer: >60 mL/min (ref >60)
GFR calc non Af Amer: 56 mL/min — ABNORMAL LOW (ref >60)
Glucose, Bld: 116 mg/dL — ABNORMAL HIGH (ref 70–99)
Potassium: 4.2 mmol/L (ref 3.5–5.1)
Sodium: 136 mmol/L (ref 135–145)
Total Bilirubin: 0.9 mg/dL (ref 0.3–1.2)
Total Protein: 6.3 g/dL — ABNORMAL LOW (ref 6.5–8.1)

## 2017-10-09 LAB — MAGNESIUM: Magnesium: 2.1 mg/dL (ref 1.7–2.4)

## 2017-10-09 NOTE — Progress Notes (Signed)
Patient states she has developed a cough.  Also having some hoarseness.  Otherwise, offers no complaints.

## 2017-10-09 NOTE — Patient Instructions (Signed)
Doxorubicin injection What is this medicine? DOXORUBICIN (dox oh ROO bi sin) is a chemotherapy drug. It is used to treat many kinds of cancer like leukemia, lymphoma, neuroblastoma, sarcoma, and Wilms' tumor. It is also used to treat bladder cancer, breast cancer, lung cancer, ovarian cancer, stomach cancer, and thyroid cancer. This medicine may be used for other purposes; ask your health care provider or pharmacist if you have questions. COMMON BRAND NAME(S): Adriamycin, Adriamycin PFS, Adriamycin RDF, Rubex What should I tell my health care provider before I take this medicine? They need to know if you have any of these conditions: -heart disease -history of low blood counts caused by a medicine -liver disease -recent or ongoing radiation therapy -an unusual or allergic reaction to doxorubicin, other chemotherapy agents, other medicines, foods, dyes, or preservatives -pregnant or trying to get pregnant -breast-feeding How should I use this medicine? This drug is given as an infusion into a vein. It is administered in a hospital or clinic by a specially trained health care professional. If you have pain, swelling, burning or any unusual feeling around the site of your injection, tell your health care professional right away. Talk to your pediatrician regarding the use of this medicine in children. Special care may be needed. Overdosage: If you think you have taken too much of this medicine contact a poison control center or emergency room at once. NOTE: This medicine is only for you. Do not share this medicine with others. What if I miss a dose? It is important not to miss your dose. Call your doctor or health care professional if you are unable to keep an appointment. What may interact with this medicine? This medicine may interact with the following medications: -6-mercaptopurine -paclitaxel -phenytoin -St. John's Wort -trastuzumab -verapamil This list may not describe all possible  interactions. Give your health care provider a list of all the medicines, herbs, non-prescription drugs, or dietary supplements you use. Also tell them if you smoke, drink alcohol, or use illegal drugs. Some items may interact with your medicine. What should I watch for while using this medicine? This drug may make you feel generally unwell. This is not uncommon, as chemotherapy can affect healthy cells as well as cancer cells. Report any side effects. Continue your course of treatment even though you feel ill unless your doctor tells you to stop. There is a maximum amount of this medicine you should receive throughout your life. The amount depends on the medical condition being treated and your overall health. Your doctor will watch how much of this medicine you receive in your lifetime. Tell your doctor if you have taken this medicine before. You may need blood work done while you are taking this medicine. Your urine may turn red for a few days after your dose. This is not blood. If your urine is dark or brown, call your doctor. In some cases, you may be given additional medicines to help with side effects. Follow all directions for their use. Call your doctor or health care professional for advice if you get a fever, chills or sore throat, or other symptoms of a cold or flu. Do not treat yourself. This drug decreases your body's ability to fight infections. Try to avoid being around people who are sick. This medicine may increase your risk to bruise or bleed. Call your doctor or health care professional if you notice any unusual bleeding. Talk to your doctor about your risk of cancer. You may be more at risk for certain  types of cancers if you take this medicine. Do not become pregnant while taking this medicine or for 6 months after stopping it. Women should inform their doctor if they wish to become pregnant or think they might be pregnant. Men should not father a child while taking this medicine and  for 6 months after stopping it. There is a potential for serious side effects to an unborn child. Talk to your health care professional or pharmacist for more information. Do not breast-feed an infant while taking this medicine. This medicine has caused ovarian failure in some women and reduced sperm counts in some men This medicine may interfere with the ability to have a child. Talk with your doctor or health care professional if you are concerned about your fertility. What side effects may I notice from receiving this medicine? Side effects that you should report to your doctor or health care professional as soon as possible: -allergic reactions like skin rash, itching or hives, swelling of the face, lips, or tongue -breathing problems -chest pain -fast or irregular heartbeat -low blood counts - this medicine may decrease the number of white blood cells, red blood cells and platelets. You may be at increased risk for infections and bleeding. -pain, redness, or irritation at site where injected -signs of infection - fever or chills, cough, sore throat, pain or difficulty passing urine -signs of decreased platelets or bleeding - bruising, pinpoint red spots on the skin, black, tarry stools, blood in the urine -swelling of the ankles, feet, hands -tiredness -weakness Side effects that usually do not require medical attention (report to your doctor or health care professional if they continue or are bothersome): -diarrhea -hair loss -mouth sores -nail discoloration or damage -nausea -red colored urine -vomiting This list may not describe all possible side effects. Call your doctor for medical advice about side effects. You may report side effects to FDA at 1-800-FDA-1088. Where should I keep my medicine? This drug is given in a hospital or clinic and will not be stored at home. NOTE: This sheet is a summary. It may not cover all possible information. If you have questions about this  medicine, talk to your doctor, pharmacist, or health care provider.  2018 Elsevier/Gold Standard (2015-04-23 11:28:51) Cyclophosphamide injection What is this medicine? CYCLOPHOSPHAMIDE (sye kloe FOSS fa mide) is a chemotherapy drug. It slows the growth of cancer cells. This medicine is used to treat many types of cancer like lymphoma, myeloma, leukemia, breast cancer, and ovarian cancer, to name a few. This medicine may be used for other purposes; ask your health care provider or pharmacist if you have questions. COMMON BRAND NAME(S): Cytoxan, Neosar What should I tell my health care provider before I take this medicine? They need to know if you have any of these conditions: -blood disorders -history of other chemotherapy -infection -kidney disease -liver disease -recent or ongoing radiation therapy -tumors in the bone marrow -an unusual or allergic reaction to cyclophosphamide, other chemotherapy, other medicines, foods, dyes, or preservatives -pregnant or trying to get pregnant -breast-feeding How should I use this medicine? This drug is usually given as an injection into a vein or muscle or by infusion into a vein. It is administered in a hospital or clinic by a specially trained health care professional. Talk to your pediatrician regarding the use of this medicine in children. Special care may be needed. Overdosage: If you think you have taken too much of this medicine contact a poison control center or emergency room at  once. NOTE: This medicine is only for you. Do not share this medicine with others. What if I miss a dose? It is important not to miss your dose. Call your doctor or health care professional if you are unable to keep an appointment. What may interact with this medicine? This medicine may interact with the following medications: -amiodarone -amphotericin B -azathioprine -certain antiviral medicines for HIV or AIDS such as protease inhibitors (e.g., indinavir,  ritonavir) and zidovudine -certain blood pressure medications such as benazepril, captopril, enalapril, fosinopril, lisinopril, moexipril, monopril, perindopril, quinapril, ramipril, trandolapril -certain cancer medications such as anthracyclines (e.g., daunorubicin, doxorubicin), busulfan, cytarabine, paclitaxel, pentostatin, tamoxifen, trastuzumab -certain diuretics such as chlorothiazide, chlorthalidone, hydrochlorothiazide, indapamide, metolazone -certain medicines that treat or prevent blood clots like warfarin -certain muscle relaxants such as succinylcholine -cyclosporine -etanercept -indomethacin -medicines to increase blood counts like filgrastim, pegfilgrastim, sargramostim -medicines used as general anesthesia -metronidazole -natalizumab This list may not describe all possible interactions. Give your health care provider a list of all the medicines, herbs, non-prescription drugs, or dietary supplements you use. Also tell them if you smoke, drink alcohol, or use illegal drugs. Some items may interact with your medicine. What should I watch for while using this medicine? Visit your doctor for checks on your progress. This drug may make you feel generally unwell. This is not uncommon, as chemotherapy can affect healthy cells as well as cancer cells. Report any side effects. Continue your course of treatment even though you feel ill unless your doctor tells you to stop. Drink water or other fluids as directed. Urinate often, even at night. In some cases, you may be given additional medicines to help with side effects. Follow all directions for their use. Call your doctor or health care professional for advice if you get a fever, chills or sore throat, or other symptoms of a cold or flu. Do not treat yourself. This drug decreases your body's ability to fight infections. Try to avoid being around people who are sick. This medicine may increase your risk to bruise or bleed. Call your doctor or  health care professional if you notice any unusual bleeding. Be careful brushing and flossing your teeth or using a toothpick because you may get an infection or bleed more easily. If you have any dental work done, tell your dentist you are receiving this medicine. You may get drowsy or dizzy. Do not drive, use machinery, or do anything that needs mental alertness until you know how this medicine affects you. Do not become pregnant while taking this medicine or for 1 year after stopping it. Women should inform their doctor if they wish to become pregnant or think they might be pregnant. Men should not father a child while taking this medicine and for 4 months after stopping it. There is a potential for serious side effects to an unborn child. Talk to your health care professional or pharmacist for more information. Do not breast-feed an infant while taking this medicine. This medicine may interfere with the ability to have a child. This medicine has caused ovarian failure in some women. This medicine has caused reduced sperm counts in some men. You should talk with your doctor or health care professional if you are concerned about your fertility. If you are going to have surgery, tell your doctor or health care professional that you have taken this medicine. What side effects may I notice from receiving this medicine? Side effects that you should report to your doctor or health care professional as  soon as possible: -allergic reactions like skin rash, itching or hives, swelling of the face, lips, or tongue -low blood counts - this medicine may decrease the number of white blood cells, red blood cells and platelets. You may be at increased risk for infections and bleeding. -signs of infection - fever or chills, cough, sore throat, pain or difficulty passing urine -signs of decreased platelets or bleeding - bruising, pinpoint red spots on the skin, black, tarry stools, blood in the urine -signs of  decreased red blood cells - unusually weak or tired, fainting spells, lightheadedness -breathing problems -dark urine -dizziness -palpitations -swelling of the ankles, feet, hands -trouble passing urine or change in the amount of urine -weight gain -yellowing of the eyes or skin Side effects that usually do not require medical attention (report to your doctor or health care professional if they continue or are bothersome): -changes in nail or skin color -hair loss -missed menstrual periods -mouth sores -nausea, vomiting This list may not describe all possible side effects. Call your doctor for medical advice about side effects. You may report side effects to FDA at 1-800-FDA-1088. Where should I keep my medicine? This drug is given in a hospital or clinic and will not be stored at home. NOTE: This sheet is a summary. It may not cover all possible information. If you have questions about this medicine, talk to your doctor, pharmacist, or health care provider.  2018 Elsevier/Gold Standard (2012-01-09 16:22:58)

## 2017-10-13 ENCOUNTER — Other Ambulatory Visit: Payer: Self-pay | Admitting: Hematology and Oncology

## 2017-10-13 ENCOUNTER — Encounter: Payer: Self-pay | Admitting: Urgent Care

## 2017-10-13 DIAGNOSIS — C50912 Malignant neoplasm of unspecified site of left female breast: Secondary | ICD-10-CM

## 2017-10-13 NOTE — Progress Notes (Signed)
DISCONTINUE ON PATHWAY REGIMEN - Breast     A cycle is every 21 days:     Docetaxel      Cyclophosphamide   **Always confirm dose/schedule in your pharmacy ordering system**  REASON: Toxicities / Adverse Event PRIOR TREATMENT: [id number]: TC - Docetaxel, Cyclophosphamide q21 Days x 4 Cycles TREATMENT RESPONSE: Unable to Evaluate  START ON PATHWAY REGIMEN - Breast   Paclitaxel 80 mg/m2 Weekly:   Administer weekly:     Paclitaxel   **Always confirm dose/schedule in your pharmacy ordering system**  Doxorubicin + Cyclophosphamide (AC):   A cycle is every 21 days:     Doxorubicin      Cyclophosphamide   **Always confirm dose/schedule in your pharmacy ordering system**  Patient Characteristics: Preoperative or Nonsurgical Candidate (Clinical Staging), Neoadjuvant Therapy followed by Surgery, Invasive Disease, Chemotherapy, HER2 Negative/Unknown/Equivocal, ER Positive Therapeutic Status: Preoperative or Nonsurgical Candidate (Clinical Staging) AJCC M Category: cM0 AJCC Grade: G2 Breast Surgical Plan: Neoadjuvant Therapy followed by Surgery ER Status: Positive (+) AJCC 8 Stage Grouping: IA HER2 Status: Negative (-) AJCC T Category: cT1c AJCC N Category: cN0 PR Status: Positive (+) Intent of Therapy: Curative Intent, Discussed with Patient

## 2017-10-14 ENCOUNTER — Ambulatory Visit
Admission: RE | Admit: 2017-10-14 | Discharge: 2017-10-14 | Disposition: A | Discharge status: Home / Self Care | Payer: Medicare Other | Source: Ambulatory Visit | Attending: Urgent Care | Admitting: Urgent Care

## 2017-10-14 DIAGNOSIS — C50912 Malignant neoplasm of unspecified site of left female breast: Secondary | ICD-10-CM | POA: Insufficient documentation

## 2017-10-14 DIAGNOSIS — E876 Hypokalemia: Secondary | ICD-10-CM | POA: Diagnosis not present

## 2017-10-14 DIAGNOSIS — E785 Hyperlipidemia, unspecified: Secondary | ICD-10-CM | POA: Diagnosis not present

## 2017-10-14 DIAGNOSIS — I10 Essential (primary) hypertension: Secondary | ICD-10-CM | POA: Diagnosis not present

## 2017-10-14 DIAGNOSIS — Z01818 Encounter for other preprocedural examination: Secondary | ICD-10-CM | POA: Diagnosis present

## 2017-10-14 NOTE — Progress Notes (Signed)
*  PRELIMINARY RESULTS* Echocardiogram 2D Echocardiogram has been performed.  Mariah Hogan 10/14/2017, 10:56 AM

## 2017-10-15 ENCOUNTER — Other Ambulatory Visit: Payer: Self-pay | Admitting: General Surgery

## 2017-10-15 ENCOUNTER — Telehealth: Payer: Self-pay | Admitting: *Deleted

## 2017-10-15 ENCOUNTER — Telehealth: Payer: Self-pay

## 2017-10-15 DIAGNOSIS — C50912 Malignant neoplasm of unspecified site of left female breast: Secondary | ICD-10-CM

## 2017-10-15 NOTE — Telephone Encounter (Signed)
-----   Message from Robert Bellow, MD sent at 10/14/2017  9:01 PM EDT ----- [place] before [place]th[place] Please contact her and work out a date to have it completed. She will be expecting your call.

## 2017-10-15 NOTE — Telephone Encounter (Signed)
Patient called and stated that Dr.Corcoran stated that she needs a port placed for Chemo that she will be starting on 10/27/17. Patient also stated that Dr Mike Gip will be getting in touch with you regarding this.

## 2017-10-15 NOTE — Telephone Encounter (Signed)
Call to patient to see about scheduling her port placement. She states that Dr Mike Gip would like her to have this at least one week prior to her scheduled chemotherapy date of 10/27/17.  The patient is scheduled for surgery at [facility] on 10/21/17. She will pre admit by phone. The surgery instructions have been reviewed and mailed to the patient. She is aware of date and instructions.

## 2017-10-20 ENCOUNTER — Other Ambulatory Visit: Payer: Self-pay

## 2017-10-20 ENCOUNTER — Encounter
Admission: RE | Admit: 2017-10-20 | Discharge: 2017-10-20 | Disposition: A | Discharge status: Home / Self Care | Payer: Medicare Other | Source: Ambulatory Visit | Attending: General Surgery | Admitting: General Surgery

## 2017-10-20 HISTORY — DX: Cardiac arrhythmia, unspecified: I49.9

## 2017-10-20 HISTORY — DX: Other specified postprocedural states: R11.2

## 2017-10-20 HISTORY — DX: Personal history of other diseases of the digestive system: Z87.19

## 2017-10-20 HISTORY — DX: Adverse effect of unspecified anesthetic, initial encounter: T41.45XA

## 2017-10-20 HISTORY — DX: Other complications of anesthesia, initial encounter: T88.59XA

## 2017-10-20 HISTORY — DX: Other specified postprocedural states: Z98.890

## 2017-10-20 HISTORY — DX: Anemia, unspecified: D64.9

## 2017-10-20 MED ORDER — CEFAZOLIN SODIUM-DEXTROSE 2-4 GM/100ML-% IV SOLN
2.0000 g | INTRAVENOUS | Status: AC
  Administered 2017-10-21: 2 g via INTRAVENOUS

## 2017-10-20 NOTE — Pre-Procedure Instructions (Signed)
[facility]  ECHO COMPLETE WO IMAGING ENHANCING AGENT  Order# [id number]  Reading physician: Nelva Bush, MD Ordering physician: Karen Kitchens, NP Study date: 10/14/17  Study Result   Result status: Final result                   *[place], [place] [address]                            [phone]  ------------------------------------------------------------------- Transthoracic Echocardiography  Patient:    Mariah Hogan, Mariah Hogan MR #:       [id number] Study Date: 10/14/2017 Gender:     F Age:        67 Height:     165.1 cm Weight:     74 kg BSA:        1.86 m^2 Pt. Status: Room:   PERFORMING   [facility]  REFERRING    Mclaughlin, Miriam K  ATTENDING    Winifred Olive E  REFERRING    Honor Loh E  SONOGRAPHER  Charmayne Sheer, RDCS  cc:  ------------------------------------------------------------------- LV EF: 55% -   60%  ------------------------------------------------------------------- Indications:      Pre-procedural examination; Invasive ductal carcinoma of left breast.  ------------------------------------------------------------------- History:   PMH:  HLD, Breast cancer.  Risk factors:  Hypertension.   ------------------------------------------------------------------- Study Conclusions  - Left ventricle: The cavity size was normal. There was mild focal   basal hypertrophy of the septum. Systolic function was normal.   The estimated ejection fraction was in the range of 55% to 60%.   Wall motion was normal; there were no regional wall motion   abnormalities. Doppler parameters are consistent with abnormal   left ventricular relaxation (grade 1 diastolic dysfunction). - Right ventricle: The cavity size was normal. Wall thickness was   normal. Systolic function was  normal.  ------------------------------------------------------------------- Study data:   Study status:  Routine.  Procedure:  The patient reported no pain pre or post test. Transthoracic echocardiography. Image quality was fair.          Transthoracic echocardiography. M-mode, complete 2D, spectral Doppler, and color Doppler. Birthdate:  Patient birthdate: 29-Oct-1950.  Age:  Patient is 67 yr old.  Sex:  Gender: female.    BMI: 27.1 kg/m^2.  Blood pressure:   154/81  Patient status:  Outpatient.  Study date:  Study date: 10/14/2017. Study time: 10:18 AM.  Location:  Echo laboratory.  -------------------------------------------------------------------  ------------------------------------------------------------------- Left ventricle:  The cavity size was normal. There was mild focal basal hypertrophy of the septum. Systolic function was normal. The estimated ejection fraction was in the range of 55% to 60%. Wall motion was normal; there were no regional wall motion abnormalities. Doppler parameters are consistent with abnormal left ventricular relaxation (grade 1 diastolic dysfunction).  ------------------------------------------------------------------- Aortic valve:   Probably trileaflet. Cusp separation was normal. Doppler:  Transvalvular velocity was within the normal range. There was no stenosis. There was no regurgitation.    VTI ratio of LVOT to aortic valve: 0.64. Valve area (VTI): 2.02 cm^2. Indexed valve area (VTI): 1.09 cm^2/m^2. Peak velocity ratio of LVOT  to aortic valve: 0.65. Valve area (Vmax): 2.03 cm^2. Indexed valve area (Vmax): 1.09 cm^2/m^2. Mean velocity ratio of LVOT to aortic valve: 0.58. Valve area (Vmean): 1.84 cm^2. Indexed valve area (Vmean): 0.99 cm^2/m^2.    Mean gradient (S): 5 mm Hg. Peak gradient (S): 9 mm Hg.  ------------------------------------------------------------------- Aorta:  Aortic root: The aortic root was normal in size. Ascending  aorta: The ascending aorta was normal in size.  ------------------------------------------------------------------- Mitral valve:   Structurally normal valve.   Leaflet separation was normal.  Doppler:  Transvalvular velocity was within the normal range. There was no evidence for stenosis. There was no regurgitation.    Valve area by pressure half-time: 3.55 cm^2. Indexed valve area by pressure half-time: 1.91 cm^2/m^2. Valve area by continuity equation (using LVOT flow): 2.93 cm^2. Indexed valve area by continuity equation (using LVOT flow): 1.58 cm^2/m^2. Mean gradient (D): 1 mm Hg.  ------------------------------------------------------------------- Left atrium:  The atrium was normal in size.  ------------------------------------------------------------------- Right ventricle:  The cavity size was normal. Wall thickness was normal. Systolic function was normal.  ------------------------------------------------------------------- Pulmonic valve:    Structurally normal valve.   Cusp separation was normal.  Doppler:  Transvalvular velocity was within the normal range. There was no regurgitation.  ------------------------------------------------------------------- Tricuspid valve:   Structurally normal valve.   Leaflet separation was normal.  Doppler:  Transvalvular velocity was within the normal range. There was no regurgitation.  ------------------------------------------------------------------- Pulmonary artery:   The main pulmonary artery was normal-sized. Systolic pressure could not be accurately estimated.  ------------------------------------------------------------------- Right atrium:  The atrium was normal in size.  ------------------------------------------------------------------- Pericardium:  There was no pericardial effusion.  ------------------------------------------------------------------- Systemic veins: Inferior vena cava: The vessel was normal  in size. The respirophasic diameter changes were in the normal range (>= 50%), consistent with normal central venous pressure.  ------------------------------------------------------------------- Measurements   Left ventricle                           Value          Reference  LV ID, ED, PLAX chordal                  46.4  mm       43 - 52  LV ID, ES, PLAX chordal                  27.1  mm       23 - 38  LV fx shortening, PLAX chordal           42    %        >=29  LV PW thickness, ED                      12.1  mm       ----------  IVS/LV PW ratio, ED                      0.79           <=1.3  Stroke volume, 2D                        62    ml       ----------  Stroke volume/bsa, 2D                    33    ml/m^2   ----------  LV  e&', lateral                           7.83  cm/s     ----------  LV E/e&', lateral                         7.8            ----------  LV e&', medial                            4.79  cm/s     ----------  LV E/e&', medial                          12.76          ----------  LV e&', average                           6.31  cm/s     ----------  LV E/e&', average                         9.68           ----------    Ventricular septum                       Value          Reference  IVS thickness, ED                        9.52  mm       ----------    LVOT                                     Value          Reference  LVOT ID, S                               20    mm       ----------  LVOT area                                3.14  cm^2     ----------  LVOT peak velocity, S                    96.8  cm/s     ----------  LVOT mean velocity, S                    62    cm/s     ----------  LVOT VTI, S                              19.6  cm       ----------    Aortic valve                             Value  Reference  Aortic valve peak velocity, S            150   cm/s     ----------  Aortic valve mean velocity, S            106   cm/s     ----------  Aortic  valve VTI, S                      30.5  cm       ----------  Aortic mean gradient, S                  5     mm Hg    ----------  Aortic peak gradient, S                  9     mm Hg    ----------  VTI ratio, LVOT/AV                       0.64           ----------  Aortic valve area, VTI                   2.02  cm^2     ----------  Aortic valve area/bsa, VTI               1.09  cm^2/m^2 ----------  Velocity ratio, peak, LVOT/AV            0.65           ----------  Aortic valve area, peak velocity         2.03  cm^2     ----------  Aortic valve area/bsa, peak              1.09  cm^2/m^2 ----------  velocity  Velocity ratio, mean, LVOT/AV            0.58           ----------  Aortic valve area, mean velocity         1.84  cm^2     ----------  Aortic valve area/bsa, mean              0.99  cm^2/m^2 ----------  velocity    Aorta                                    Value          Reference  Aortic root ID, ED                       33    mm       ----------    Left atrium                              Value          Reference  LA ID, A-P, ES                           34    mm       ----------  LA ID/bsa, A-P  1.83  cm/m^2   <=2.2  LA volume, S                             47.3  ml       ----------  LA volume/bsa, S                         25.4  ml/m^2   ----------  LA volume, ES, 1-p A4C                   37.8  ml       ----------  LA volume/bsa, ES, 1-p A4C               20.3  ml/m^2   ----------  LA volume, ES, 1-p A2C                   59.3  ml       ----------  LA volume/bsa, ES, 1-p A2C               31.9  ml/m^2   ----------    Mitral valve                             Value          Reference  Mitral E-wave peak velocity              61.1  cm/s     ----------  Mitral A-wave peak velocity              74.7  cm/s     ----------  Mitral mean velocity, D                  50.6  cm/s     ----------  Mitral deceleration time                 211   ms       150 - 230   Mitral pressure half-time                62    ms       ----------  Mitral mean gradient, D                  1     mm Hg    ----------  Mitral E/A ratio, peak                   0.8            ----------  Mitral valve area, PHT, DP               3.55  cm^2     ----------  Mitral valve area/bsa, PHT, DP           1.91  cm^2/m^2 ----------  Mitral valve area, LVOT                  2.93  cm^2     ----------  continuity  Mitral valve area/bsa, LVOT              1.58  cm^2/m^2 ----------  continuity  Mitral annulus VTI, D                    21    cm       ----------  Right atrium                             Value          Reference  RA ID, S-I, ES                           52.7  mm       ----------  RA ID, M-L, ES, A4C                      36.1  mm       30 - 46  RA ID, S-I, ES, A4C                      48.7  mm       34 - 49  RA area, ES, A4C                         13.1  cm^2     8.3 - 19.5  RA volume, ES, A/L                       29    ml       ----------  RA volume/bsa, ES, A/L                   15.6  ml/m^2   ----------    Right ventricle                          Value          Reference  RV ID, minor axis, ED, A4C base          38.1  mm       ----------  RV ID, minor axis, ED, A4C mid           29.3  mm       ----------  RV s&', lateral, S                        12.1  cm/s     ----------    Pulmonic valve                           Value          Reference  Pulmonic valve peak velocity, S          95.7  cm/s     ----------  Pulmonic acceleration time               88    ms       ----------  Legend: (L)  and  (H)  mark values outside specified reference range.  ------------------------------------------------------------------- Prepared and Electronically Authenticated by  Nelva Bush, MD 2019-08-07T17:48:12  [facility] Images   Show images for ECHOCARDIOGRAM COMPLETE  Patient Information   Patient Name Mariah Hogan, Mariah Hogan Sex Female DOB 06/03/1950 SSN [id number]   Reason for Exam  Priority: Routine  Dx: Pre-procedural examination [P82.423 (ICD-10-CM)]; Invasive ductal carcinoma of left breast ([place]) [C50.912 (ICD-10-CM)]; Hypokalemia [E87.6 (ICD-10-CM)]  Comments:   Surgical History   Surgical History   No past medical history on file.    Other Surgical History   Procedure Laterality  Date Comment Source  ABDOMINAL HYSTERECTOMY  1998  Provider  bladder tack  [phone]  Provider  BREAST BIOPSY Bilateral   Provider  BREAST BIOPSY Left 1990  Provider  BREAST BIOPSY Left 2018 core bx- neg Provider  BREAST BIOPSY Left 08/04/2017 left UOQ 2 oclock INVASIVE DUCTAL CARCINOMA. Provider  BREAST CYST EXCISION  x3  Provider  BREAST SURGERY Left 02/13/14 Fibrocystic changes, pseudo-angiomatous stromal hyperplasia. No atypia or malignancy. Provider  CARPAL TUNNEL RELEASE  x2  Provider  CHOLECYSTECTOMY  2008  Provider  COLONOSCOPY WITH PROPOFOL N/A 01/12/2015 Procedure: COLONOSCOPY WITH PROPOFOL; Surgeon: Manya Silvas, MD; Location: [facility] ENDOSCOPY; Service: Endoscopy; Laterality: N/A; Provider  ESOPHAGOGASTRODUODENOSCOPY (EGD) WITH PROPOFOL N/A 01/12/2015 Procedure: ESOPHAGOGASTRODUODENOSCOPY (EGD) WITH PROPOFOL; Surgeon: Manya Silvas, MD; Location: [facility] ENDOSCOPY; Service: Endoscopy; Laterality: N/A; Provider  NASAL RECONSTRUCTION  1983  Provider  SAVORY DILATION N/A 01/12/2015 Procedure: SAVORY DILATION; Surgeon: Manya Silvas, MD; Location: [facility] ENDOSCOPY; Service: Endoscopy; Laterality: N/A; Provider  TONSILLECTOMY AND ADENOIDECTOMY  1956  Provider    Performing Technologist/Nurse   Performing Technologist/Nurse: Deland Pretty                    Implants    No active implants to display in this view.  Order-Level Documents:   There are no order-level documents.  Encounter-Level Documents - 10/14/2017:   Electronic signature on 10/14/2017 10:02 AM - Signed      Signed   Electronically signed by Nelva Bush, MD  on 10/14/17 at 1748 EDT  Printable Result Report   Result Report   External Result Report   External Result Report

## 2017-10-20 NOTE — Patient Instructions (Signed)
Your procedure is scheduled on: 10-21-17 Report to Same Day Surgery 2nd floor medical mall [facility] Entrance-take elevator on left to 2nd floor.  Check in with surgery information desk.) To find out your arrival time please call [phone] between 1PM - 3PM on 10-20-17  Remember: Instructions that are not followed completely may result in serious medical risk, up to and including death, or upon the discretion of your surgeon and anesthesiologist your surgery may need to be rescheduled.    _x___ 1. Do not eat food after midnight the night before your procedure. You may drink clear liquids up to 2 hours before you are scheduled to arrive at the hospital for your procedure.  Do not drink clear liquids within 2 hours of your scheduled arrival to the hospital.  Clear liquids include  --Water or Apple juice without pulp  --Clear carbohydrate beverage such as ClearFast or Gatorade  --Black Coffee or Clear Tea (No milk, no creamers, do not add anything to the coffee or Tea   ____Ensure clear carbohydrate drink on the way to the hospital for bariatric patients  ____Ensure clear carbohydrate drink 3 hours before surgery for Dr Dwyane Luo patients if physician instructed.   No gum chewing or hard candies.     __x__ 2. No Alcohol for 24 hours before or after surgery.   __x__3. No Smoking or e-cigarettes for 24 prior to surgery.  Do not use any chewable tobacco products for at least 6 hour prior to surgery   ____  4. Bring all medications with you on the day of surgery if instructed.    __x__ 5. Notify your doctor if there is any change in your medical condition     (cold, fever, infections).    x___6. On the morning of surgery brush your teeth with toothpaste and water.  You may rinse your mouth with mouth wash if you wish.  Do not swallow any toothpaste or mouthwash.   Do not wear jewelry, make-up, hairpins, clips or nail polish.  Do not wear lotions, powders, or perfumes. You may wear  deodorant.  Do not shave 48 hours prior to surgery. Men may shave face and neck.  Do not bring valuables to the hospital.    [facility] is not responsible for any belongings or valuables.               Contacts, dentures or bridgework may not be worn into surgery.  Leave your suitcase in the car. After surgery it may be brought to your room.  For patients admitted to the hospital, discharge time is determined by your treatment team.  _  Patients discharged the day of surgery will not be allowed to drive home.  You will need someone to drive you home and stay with you the night of your procedure.    Please read over the following fact sheets that you were given:   [facility] Preparing for Surgery   _x___ TAKE THE FOLLOWING MEDICATION THE MORNING OF SURGERY WITH A SMALL SIP OF WATER. These include:  1. PROTONIX  2. ATENOLOL  3.  4.  5.  6.  ____Fleets enema or Magnesium Citrate as directed.   _x___ Use CHG Soap or sage wipes as directed on instruction sheet   _X___ Use inhalers on the day of surgery and bring to hospital day of surgery-USE YOUR ALBUTEROL INHALER AM [place]  ____ Stop Metformin and Janumet 2 days prior to surgery.  ____ Take 1/2 of usual insulin dose the night before surgery and none on the morning surgery.   ____ Follow recommendations from Cardiologist, Pulmonologist or PCP regarding stopping Aspirin, Coumadin, Plavix ,Eliquis, Effient, or Pradaxa, and Pletal.  ____Stop Anti-inflammatories such as Advil, Aleve, Ibuprofen, Motrin, Naproxen, Naprosyn, Goodies powders or aspirin products. OK to take Tylenol    ____ Stop supplements until after surgery.     ____ Bring C-Pap to the hospital.

## 2017-10-21 ENCOUNTER — Encounter: Payer: Self-pay | Admitting: Emergency Medicine

## 2017-10-21 ENCOUNTER — Ambulatory Visit: Payer: Medicare Other | Admitting: Anesthesiology

## 2017-10-21 ENCOUNTER — Ambulatory Visit
Admission: RE | Admit: 2017-10-21 | Discharge: 2017-10-21 | Disposition: A | Discharge status: Home / Self Care | Payer: Medicare Other | Source: Ambulatory Visit | Attending: General Surgery | Admitting: General Surgery

## 2017-10-21 ENCOUNTER — Encounter
Admission: RE | Disposition: A | Discharge status: Home / Self Care | Payer: Self-pay | Source: Ambulatory Visit | Attending: General Surgery

## 2017-10-21 ENCOUNTER — Ambulatory Visit: Payer: Medicare Other

## 2017-10-21 DIAGNOSIS — J45909 Unspecified asthma, uncomplicated: Secondary | ICD-10-CM | POA: Diagnosis not present

## 2017-10-21 DIAGNOSIS — K219 Gastro-esophageal reflux disease without esophagitis: Secondary | ICD-10-CM | POA: Diagnosis not present

## 2017-10-21 DIAGNOSIS — I10 Essential (primary) hypertension: Secondary | ICD-10-CM | POA: Insufficient documentation

## 2017-10-21 DIAGNOSIS — Z85828 Personal history of other malignant neoplasm of skin: Secondary | ICD-10-CM | POA: Insufficient documentation

## 2017-10-21 DIAGNOSIS — K449 Diaphragmatic hernia without obstruction or gangrene: Secondary | ICD-10-CM | POA: Diagnosis not present

## 2017-10-21 DIAGNOSIS — Z7951 Long term (current) use of inhaled steroids: Secondary | ICD-10-CM | POA: Diagnosis not present

## 2017-10-21 DIAGNOSIS — C50912 Malignant neoplasm of unspecified site of left female breast: Secondary | ICD-10-CM

## 2017-10-21 DIAGNOSIS — Z79899 Other long term (current) drug therapy: Secondary | ICD-10-CM | POA: Diagnosis not present

## 2017-10-21 DIAGNOSIS — C50412 Malignant neoplasm of upper-outer quadrant of left female breast: Secondary | ICD-10-CM | POA: Insufficient documentation

## 2017-10-21 DIAGNOSIS — Z880 Allergy status to penicillin: Secondary | ICD-10-CM | POA: Diagnosis not present

## 2017-10-21 DIAGNOSIS — Z95828 Presence of other vascular implants and grafts: Secondary | ICD-10-CM

## 2017-10-21 HISTORY — PX: PORTACATH PLACEMENT: SHX2246

## 2017-10-21 SURGERY — INSERTION, TUNNELED CENTRAL VENOUS DEVICE, WITH PORT
Anesthesia: General | Wound class: Clean

## 2017-10-21 MED ORDER — HYDROCODONE-ACETAMINOPHEN 5-325 MG PO TABS: 1.0000 | ORAL_TABLET | ORAL | 0 refills | Status: DC | PRN

## 2017-10-21 MED ORDER — ACETAMINOPHEN 160 MG/5ML PO SOLN: 325.0000 mg | ORAL | Status: DC | PRN

## 2017-10-21 MED ORDER — HYDROCODONE-ACETAMINOPHEN 5-325 MG PO TABS
1.0000 | ORAL_TABLET | ORAL | Status: DC | PRN
  Administered 2017-10-21: 1 via ORAL

## 2017-10-21 MED ORDER — LACTATED RINGERS IV SOLN
INTRAVENOUS | Status: DC
  Administered 2017-10-21 (×2): via INTRAVENOUS

## 2017-10-21 MED ORDER — PROPOFOL 500 MG/50ML IV EMUL
INTRAVENOUS | Status: DC | PRN
  Administered 2017-10-21: 125 ug/kg/min via INTRAVENOUS

## 2017-10-21 MED ORDER — PROMETHAZINE HCL 25 MG/ML IJ SOLN: 6.2500 mg | INTRAMUSCULAR | Status: DC | PRN

## 2017-10-21 MED ORDER — PROPOFOL 10 MG/ML IV BOLUS
INTRAVENOUS | Status: DC | PRN
  Administered 2017-10-21: 50 mg via INTRAVENOUS

## 2017-10-21 MED ORDER — MIDAZOLAM HCL 2 MG/2ML IJ SOLN
INTRAMUSCULAR | Status: AC
  Filled 2017-10-21: qty 2

## 2017-10-21 MED ORDER — MEPERIDINE HCL 50 MG/ML IJ SOLN: 6.2500 mg | INTRAMUSCULAR | Status: DC | PRN

## 2017-10-21 MED ORDER — LIDOCAINE HCL (PF) 1 % IJ SOLN
INTRAMUSCULAR | Status: DC | PRN
  Administered 2017-10-21: 10 mL via INTRADERMAL

## 2017-10-21 MED ORDER — PROPOFOL 500 MG/50ML IV EMUL
INTRAVENOUS | Status: AC
  Filled 2017-10-21: qty 50

## 2017-10-21 MED ORDER — HYDROCODONE-ACETAMINOPHEN 5-325 MG PO TABS
ORAL_TABLET | ORAL | Status: AC
  Administered 2017-10-21: 1 via ORAL
  Filled 2017-10-21: qty 1

## 2017-10-21 MED ORDER — MIDAZOLAM HCL 2 MG/2ML IJ SOLN
INTRAMUSCULAR | Status: DC | PRN
  Administered 2017-10-21: 2 mg via INTRAVENOUS

## 2017-10-21 MED ORDER — FENTANYL CITRATE (PF) 100 MCG/2ML IJ SOLN
INTRAMUSCULAR | Status: AC
  Filled 2017-10-21: qty 2

## 2017-10-21 MED ORDER — CEFAZOLIN SODIUM-DEXTROSE 2-4 GM/100ML-% IV SOLN
INTRAVENOUS | Status: AC
  Filled 2017-10-21: qty 100

## 2017-10-21 MED ORDER — ACETAMINOPHEN 325 MG PO TABS: 325.0000 mg | ORAL_TABLET | ORAL | Status: DC | PRN

## 2017-10-21 MED ORDER — LIDOCAINE HCL (PF) 1 % IJ SOLN
INTRAMUSCULAR | Status: AC
  Filled 2017-10-21: qty 30

## 2017-10-21 MED ORDER — PROPOFOL 10 MG/ML IV BOLUS
INTRAVENOUS | Status: AC
  Filled 2017-10-21: qty 20

## 2017-10-21 MED ORDER — HYDROCODONE-ACETAMINOPHEN 7.5-325 MG PO TABS: 1.0000 | ORAL_TABLET | Freq: Once | ORAL | Status: DC | PRN

## 2017-10-21 MED ORDER — FENTANYL CITRATE (PF) 100 MCG/2ML IJ SOLN: 25.0000 ug | INTRAMUSCULAR | Status: DC | PRN

## 2017-10-21 MED ORDER — SODIUM CHLORIDE 0.9 % IJ SOLN
INTRAMUSCULAR | Status: AC
  Filled 2017-10-21: qty 50

## 2017-10-21 SURGICAL SUPPLY — 29 items
BLADE SURG 15 STRL SS SAFETY (BLADE) ×3 IMPLANT
CHLORAPREP W/TINT 26ML (MISCELLANEOUS) ×3 IMPLANT
CLOSURE WOUND 1/2 X4 (GAUZE/BANDAGES/DRESSINGS) ×1
COVER LIGHT HANDLE STERIS (MISCELLANEOUS) ×6 IMPLANT
DECANTER SPIKE VIAL GLASS SM (MISCELLANEOUS) ×6 IMPLANT
DRAPE C-ARM XRAY 36X54 (DRAPES) ×3 IMPLANT
DRAPE LAPAROTOMY TRNSV 106X77 (MISCELLANEOUS) ×3 IMPLANT
DRSG TEGADERM 2-3/8X2-3/4 SM (GAUZE/BANDAGES/DRESSINGS) ×3 IMPLANT
DRSG TEGADERM 4X4.75 (GAUZE/BANDAGES/DRESSINGS) ×3 IMPLANT
DRSG TELFA 4X3 1S NADH ST (GAUZE/BANDAGES/DRESSINGS) ×3 IMPLANT
ELECT REM PT RETURN 9FT ADLT (ELECTROSURGICAL) ×3
ELECTRODE REM PT RTRN 9FT ADLT (ELECTROSURGICAL) ×1 IMPLANT
GLOVE BIO SURGEON STRL SZ7.5 (GLOVE) ×3 IMPLANT
GLOVE INDICATOR 8.0 STRL GRN (GLOVE) ×3 IMPLANT
GOWN STRL REUS W/ TWL LRG LVL3 (GOWN DISPOSABLE) ×2 IMPLANT
GOWN STRL REUS W/TWL LRG LVL3 (GOWN DISPOSABLE) ×4
KIT PORT POWER 8FR ISP CVUE (Port) ×3 IMPLANT
KIT TURNOVER KIT A (KITS) ×3 IMPLANT
LABEL OR SOLS (LABEL) ×3 IMPLANT
NS IRRIG 500ML POUR BTL (IV SOLUTION) ×3 IMPLANT
PACK PORT-A-CATH (MISCELLANEOUS) ×3 IMPLANT
STRIP CLOSURE SKIN 1/2X4 (GAUZE/BANDAGES/DRESSINGS) ×2 IMPLANT
SUT PROLENE 3 0 SH DA (SUTURE) ×3 IMPLANT
SUT VIC AB 3-0 SH 27 (SUTURE) ×2
SUT VIC AB 3-0 SH 27X BRD (SUTURE) ×1 IMPLANT
SUT VIC AB 4-0 FS2 27 (SUTURE) ×3 IMPLANT
SWABSTK COMLB BENZOIN TINCTURE (MISCELLANEOUS) ×3 IMPLANT
SYR 10ML LL (SYRINGE) ×3 IMPLANT
SYR 10ML SLIP (SYRINGE) ×3 IMPLANT

## 2017-10-21 NOTE — Transfer of Care (Signed)
Immediate Anesthesia Transfer of Care Note  Patient: Mariah Hogan  Procedure(s) Performed: INSERTION PORT-A-CATH (N/A )  Patient Location: PACU  Anesthesia Type:General  Level of Consciousness: sedated  Airway & Oxygen Therapy: Patient Spontanous Breathing and Patient connected to face mask oxygen  Post-op Assessment: Report given to RN and Post -op Vital signs reviewed and stable  Post vital signs: Reviewed and stable  Last Vitals:  Vitals Value Taken Time  BP 115/69 10/21/2017  2:26 PM  Temp    Pulse 67 10/21/2017  2:34 PM  Resp 18 10/21/2017  2:34 PM  SpO2 100 % 10/21/2017  2:34 PM  Vitals shown include unvalidated device data.  Last Pain:  Vitals:   10/21/17 1426  TempSrc:   PainSc: (P) 0-No pain         Complications: No apparent anesthesia complications

## 2017-10-21 NOTE — Anesthesia Procedure Notes (Signed)
Date/Time: 10/21/2017 1:54 PM Performed by: Nelda Marseille, CRNA Pre-anesthesia Checklist: Patient identified, Emergency Drugs available, Suction available, Patient being monitored and Timeout performed Oxygen Delivery Method: Simple face mask

## 2017-10-21 NOTE — Discharge Instructions (Signed)

## 2017-10-21 NOTE — Progress Notes (Signed)
Patient has allergy to pcn. Called Dr Bary Castilla ok to continue with Ancef 2g

## 2017-10-21 NOTE — Anesthesia Post-op Follow-up Note (Signed)
Anesthesia QCDR form completed.        

## 2017-10-21 NOTE — OR Nursing (Signed)
Discussed discharge instructions with pt and daughter. Both voice understanding.

## 2017-10-21 NOTE — H&P (Signed)
Mariah Hogan [id number] 11-12-1950     HPI: Patient receiving neo-adjuvant chemotherapy for breast cancer requires central venous access.   Medications Prior to Admission  Medication Sig Dispense Refill Last Dose  . albuterol (PROVENTIL HFA;VENTOLIN HFA) 108 (90 Base) MCG/ACT inhaler Inhale 2 puffs into the lungs every 6 (six) hours as needed for wheezing or shortness of breath. 1 Inhaler 1 10/21/2017 at Unknown time  . atenolol (TENORMIN) 50 MG tablet Take 50 mg by mouth every morning.    10/21/2017 at Unknown time  . calcium carbonate (TUMS - DOSED IN MG ELEMENTAL CALCIUM) 500 MG chewable tablet Chew 1 tablet by mouth daily as needed for indigestion or heartburn.     . Calcium Carbonate-Vitamin D (CALCIUM 600+D PO) Take 1 tablet by mouth daily.   Past Week at Unknown time  . Cholecalciferol (VITAMIN D3) 5000 units CAPS Take 5,000 Units by mouth daily.   Past Week at Unknown time  . citalopram (CELEXA) 20 MG tablet Take 20 mg by mouth at bedtime.    10/20/2017 at Unknown time  . fluticasone (FLONASE) 50 MCG/ACT nasal spray Place 1 spray into both nostrils daily as needed for allergies or rhinitis.   10/21/2017 at Unknown time  . pantoprazole (PROTONIX) 40 MG tablet Take 40 mg by mouth 2 (two) times daily.    10/21/2017 at Unknown time  . valACYclovir (VALTREX) 1000 MG tablet Take 1,000 mg by mouth daily. Take 1000 mg in the morning , may take a second 1000 mg dose as needed for fever blisters    10/20/2017 at Unknown time  . valsartan (DIOVAN) 80 MG tablet Take 80 mg by mouth every morning.    10/20/2017 at Unknown time   Allergies  Allergen Reactions  . Penicillin G Shortness Of Breath, Swelling and Rash    Has patient had a PCN reaction causing immediate rash, facial/tongue/throat swelling, SOB or lightheadedness with hypotension: Yes Has patient had a PCN reaction causing severe rash involving mucus membranes or skin necrosis: No Has patient had a PCN reaction that required hospitalization:  No Has patient had a PCN reaction occurring within the last 10 years: No If all of the above answers are "NO", then may proceed with Cephalosporin use.  [name] [Docetaxel] Shortness Of Breath and Palpitations    Back pain Back Spasms  . Other Other (See Comments)    Dodecyl gallate: Positive patch test  Elta MD UV Clear SPF 46: Positive patch test  fragrance dodocylgallate dodocylgallate  . Parabens Other (See Comments)    Positive patch test   . Shellac   . Fluocinonide Rash  . Latex Rash  . Triamcinolone Rash   Past Medical History:  Diagnosis Date  . Anemia   . Arthritis   . Asthma    H/O YEARS AGO-NOW HAS COUGHING SPELLS WHICH IS WHY SHE HAS HER ALBUTEROL INHALER  . Breast cancer ([place]) 08/04/2017   left breast INVASIVE DUCTAL CARCINOMA.  . Cancer ([place])    [phone] basal cell carcinoma  . Complication of anesthesia    HARD TIME WAKING UP  . Dysrhythmia    TACHYCARDIA  . GERD (gastroesophageal reflux disease)   . History of hiatal hernia    SMALL  . Hyperlipidemia   . Hypertension   . PONV (postoperative nausea and vomiting)    Past Surgical History:  Procedure Laterality Date  . ABDOMINAL HYSTERECTOMY  1998  . bladder tack  4097,[id number]  . BREAST BIOPSY Bilateral   . BREAST BIOPSY  Left 1990  . BREAST BIOPSY Left 2018   core bx- neg  . 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  Marland Kitchen BREAST CYST EXCISION  x3  . BREAST SURGERY Left 02/13/14   Fibrocystic changes, pseudo-angiomatous stromal hyperplasia. No atypia or malignancy.  . CARPAL TUNNEL RELEASE  x2  . CHOLECYSTECTOMY  2008  . COLONOSCOPY WITH PROPOFOL N/A 01/12/2015   Procedure: COLONOSCOPY WITH PROPOFOL;  Surgeon: Manya Silvas, MD;  Location[facility] ENDOSCOPY;  Service: Endoscopy;  Laterality: N/A;  . ESOPHAGOGASTRODUODENOSCOPY (EGD) WITH PROPOFOL N/A 01/12/2015   Procedure: ESOPHAGOGASTRODUODENOSCOPY (EGD) WITH PROPOFOL;  Surgeon: Manya Silvas, MD;  Location:  [facility] ENDOSCOPY;  Service: Endoscopy;  Laterality: N/A;  . NASAL RECONSTRUCTION  1983  . SAVORY DILATION N/A 01/12/2015   Procedure: SAVORY DILATION;  Surgeon: Manya Silvas, MD;  Location: [facility] ENDOSCOPY;  Service: Endoscopy;  Laterality: N/A;  . TONSILLECTOMY AND ADENOIDECTOMY  1956   Social History   Socioeconomic History  . Marital status: Single    Spouse name: Not on file  . Number of children: Not on file  . Years of education: Not on file  . Highest education level: Not on file  Occupational History  . Not on file  Social Needs  . Financial resource strain: Not on file  . Food insecurity:    Worry: Not on file    Inability: Not on file  . Transportation needs:    Medical: Not on file    Non-medical: Not on file  Tobacco Use  . Smoking status: Never Smoker  . Smokeless tobacco: Never Used  Substance and Sexual Activity  . Alcohol use: No    Alcohol/week: 0.0 standard drinks  . Drug use: No  . Sexual activity: Not on file  Lifestyle  . Physical activity:    Days per week: Not on file    Minutes per session: Not on file  . Stress: Not on file  Relationships  . Social connections:    Talks on phone: Not on file    Gets together: Not on file    Attends religious service: Not on file    Active member of club or organization: Not on file    Attends meetings of clubs or organizations: Not on file    Relationship status: Not on file  . Intimate partner violence:    Fear of current or ex partner: Not on file    Emotionally abused: Not on file    Physically abused: Not on file    Forced sexual activity: Not on file  Other Topics Concern  . Not on file  Social History Narrative  . Not on file   Social History   Social History Narrative  . Not on file     ROS: Negative.     PE: HEENT: Negative. Lungs: Clear. Cardio: RR.  Assessment/Plan: The risks associated with central venous access including arterial, pulmonary and venous injury were reviewed.  The possible need for additional treatment if pulmonary injury occurs (chest tube placement) was discussed. Proceed with planned right power port placement.   Forest Gleason [facility] 10/21/2017

## 2017-10-21 NOTE — Op Note (Signed)
Preoperative diagnosis: Left breast cancer, need for central venous access.  Postoperative diagnosis: Same.  Operative procedure: Right subclavian PowerPort placement with ultrasound and fluoroscopic guidance.  Operating Surgeon: Hervey Ard, MD.  Anesthesia: Attended local, 10 cc 1% plain Xylocaine.  Estimated blood loss: Less than 5 cc.  Clinical note: This 67 year old woman is undergoing neoadjuvant chemotherapy and central venous access was requested by her treating oncologist.  Operative note: The patient received Kefzol prior to the procedure without incident.  The right breast chest and neck was cleansed with ChloraPrep and draped.  Ultrasound was used to confirm patency of the right subclavian vein.  Local anesthesia was infiltrated in the vein cannulated on a single stick.  Guidewire was advanced followed by the dilator and the catheter.  Under fluoroscopy this was positioned at the junction of the SVC and right atrium.  The catheter was then tunneled to the right anterior chest pocket.  The port was anchored to the deep tissue with interrupted 3-0 Prolene sutures x2.  It easily irrigated and aspirated with the patient in the supine position.  The wound was closed with a running 3-0 Vicryl suture to the adipose layer.  Skin was closed with a running 4-0 Vicryl subcuticular suture for the port site and interrupted 4-0 Vicryl subarticular sutures for the insertion site.  (The wound here had been expanded to approximately 8 mm to allow unkinking of the catheter.)  The port was accessed and it was aspirated one additional time and then flushed with 10 cc of injectable saline.  Benzoin Steri-Strips followed by Telfa and Tegaderm dressings were applied.     Erect portable chest x-ray in the recovery room showed no evidence of pneumothorax and the catheter tip as described above.     The patient tolerated the procedure well.

## 2017-10-21 NOTE — Anesthesia Preprocedure Evaluation (Addendum)
Anesthesia Evaluation  Patient identified by MRN, date of birth, ID band Patient awake    Reviewed: Allergy & Precautions, H&P , NPO status , reviewed documented beta blocker date and time   History of Anesthesia Complications (+) PONV and history of anesthetic complications  Airway Mallampati: II  TM Distance: >3 FB Neck ROM: full    Dental  (+) Teeth Intact   Pulmonary asthma ,    Pulmonary exam normal        Cardiovascular hypertension, Normal cardiovascular exam+ dysrhythmias   ECHO Study Conclusions  - Left ventricle: The cavity size was normal. There was mild focal   basal hypertrophy of the septum. Systolic function was normal.   The estimated ejection fraction was in the range of 55% to 60%.   Wall motion was normal; there were no regional wall motion   abnormalities. Doppler parameters are consistent with abnormal   left ventricular relaxation (grade 1 diastolic dysfunction). - Right ventricle: The cavity size was normal. Wall thickness was   normal. Systolic function was normal.  ------------------------------------------------------------------- Study date: 10/14/2017   Neuro/Psych    GI/Hepatic hiatal hernia, GERD  Medicated and Controlled,  Endo/Other    Renal/GU Renal disease     Musculoskeletal  (+) Arthritis ,   Abdominal   Peds  Hematology  (+) anemia ,   Anesthesia Other Findings Past Medical History: No date: Anemia No date: Arthritis No date: Asthma     Comment:  H/O YEARS AGO-NOW HAS COUGHING SPELLS WHICH IS WHY SHE               HAS HER ALBUTEROL INHALER 08/04/2017: Breast cancer ([place])     Comment:  left breast INVASIVE DUCTAL CARCINOMA. No date: Cancer [facility])     Comment:  [phone] basal cell carcinoma No date: Complication of anesthesia     Comment:  HARD TIME WAKING UP No date: Dysrhythmia     Comment:  TACHYCARDIA No date: GERD (gastroesophageal reflux disease) No  date: History of hiatal hernia     Comment:  SMALL No date: Hyperlipidemia No date: Hypertension No date: PONV (postoperative nausea and vomiting)  Past Surgical History: 1998: ABDOMINAL HYSTERECTOMY 2002,2008: bladder tack No date: BREAST BIOPSY; Bilateral 1990: BREAST BIOPSY; Left 2018: BREAST BIOPSY; Left     Comment:  core bx- neg 08/04/2017: BREAST BIOPSY; Left     Comment:  left UOQ 2 oclock INVASIVE DUCTAL CARCINOMA. x3: BREAST CYST EXCISION 02/13/14: BREAST SURGERY; Left     Comment:  Fibrocystic changes, pseudo-angiomatous stromal               hyperplasia. No atypia or malignancy. x2: CARPAL TUNNEL RELEASE 2008: CHOLECYSTECTOMY 01/12/2015: COLONOSCOPY WITH PROPOFOL; N/A     Comment:  Procedure: COLONOSCOPY WITH PROPOFOL;  Surgeon: Manya Silvas, MD;  Location: [facility] ENDOSCOPY;  Service:               Endoscopy;  Laterality: N/A; 01/12/2015: ESOPHAGOGASTRODUODENOSCOPY (EGD) WITH PROPOFOL; N/A     Comment:  Procedure: ESOPHAGOGASTRODUODENOSCOPY (EGD) WITH               PROPOFOL;  Surgeon: Manya Silvas, MD;  Location: [facility]              ENDOSCOPY;  Service: Endoscopy;  Laterality: N/A; 1983: NASAL RECONSTRUCTION 01/12/2015: SAVORY DILATION; N/A     Comment:  Procedure: SAVORY DILATION;  Surgeon: Manya Silvas,  MD;  Location: [facility] ENDOSCOPY;  Service: Endoscopy;                Laterality: N/A; 1956: TONSILLECTOMY AND ADENOIDECTOMY     Reproductive/Obstetrics                            Anesthesia Physical Anesthesia Plan  ASA: II  Anesthesia Plan: General   Post-op Pain Management:    Induction: Intravenous  PONV Risk Score and Plan: 4 or greater and Treatment may vary due to age or medical condition, TIVA, Midazolam and Ondansetron  Airway Management Planned: Natural Airway and Nasal Cannula  Additional Equipment:   Intra-op Plan:   Post-operative Plan: Extubation in OR  Informed Consent: I have  reviewed the patients History and Physical, chart, labs and discussed the procedure including the risks, benefits and alternatives for the proposed anesthesia with the patient or authorized representative who has indicated his/her understanding and acceptance.   Dental Advisory Given  Plan Discussed with: CRNA  Anesthesia Plan Comments:         Anesthesia Quick Evaluation

## 2017-10-22 ENCOUNTER — Ambulatory Visit: Payer: Medicare Other | Admitting: Urgent Care

## 2017-10-22 ENCOUNTER — Telehealth: Payer: Self-pay

## 2017-10-22 ENCOUNTER — Inpatient Hospital Stay: Payer: Medicare Other

## 2017-10-22 ENCOUNTER — Ambulatory Visit: Payer: Medicare Other

## 2017-10-22 ENCOUNTER — Encounter: Payer: Self-pay | Admitting: General Surgery

## 2017-10-22 ENCOUNTER — Other Ambulatory Visit: Payer: Medicare Other

## 2017-10-22 NOTE — Telephone Encounter (Signed)
Nutrition  Planning to meet with patient during infusion but infusion appointment changed.  Patient left RD message.    Returned patient's phone call.  Patient reports appetite is good, eating well and has gained weight back.  Reports she is sore from having port placed yesterday.  Chemotherapy days will be on Tuesday per patient.    No nutrition impact symptoms reported per patient.    Weight noted 163 lb on 8/14 slight decrease from 164 lb on 7/25.  Lowest weight 154 lb on 7/5.    Intervention: Encouraged good sources of protein and well -balanced diet during treatment. Encouraged patient to reach out to RD if nutrition issues come up during treatment as RD not here on days when patient will have treatment. Patient verbalized understanding.  Next visit: as needed  Donaldson Richter B. Zenia Resides, [place], [place] Registered Dietitian [phone] (pager)

## 2017-10-23 ENCOUNTER — Encounter: Payer: Self-pay | Admitting: Urgent Care

## 2017-10-23 ENCOUNTER — Other Ambulatory Visit: Payer: Self-pay | Admitting: Urgent Care

## 2017-10-23 ENCOUNTER — Ambulatory Visit: Payer: Medicare Other

## 2017-10-23 DIAGNOSIS — C50912 Malignant neoplasm of unspecified site of left female breast: Secondary | ICD-10-CM

## 2017-10-23 MED ORDER — LIDOCAINE-PRILOCAINE 2.5-2.5 % EX CREA: TOPICAL_CREAM | CUTANEOUS | 3 refills | Status: DC

## 2017-10-23 MED ORDER — DEXAMETHASONE 4 MG PO TABS: ORAL_TABLET | ORAL | 1 refills | Status: DC

## 2017-10-23 MED ORDER — ONDANSETRON HCL 8 MG PO TABS: 8.0000 mg | ORAL_TABLET | Freq: Two times a day (BID) | ORAL | 1 refills | Status: DC | PRN

## 2017-10-26 ENCOUNTER — Other Ambulatory Visit: Payer: Self-pay | Admitting: *Deleted

## 2017-10-26 DIAGNOSIS — N6321 Unspecified lump in the left breast, upper outer quadrant: Secondary | ICD-10-CM

## 2017-10-27 ENCOUNTER — Encounter: Payer: Self-pay | Admitting: Hematology and Oncology

## 2017-10-27 ENCOUNTER — Other Ambulatory Visit: Payer: Self-pay | Admitting: Hematology and Oncology

## 2017-10-27 ENCOUNTER — Inpatient Hospital Stay (HOSPITAL_BASED_OUTPATIENT_CLINIC_OR_DEPARTMENT_OTHER): Payer: Medicare Other | Admitting: Hematology and Oncology

## 2017-10-27 ENCOUNTER — Other Ambulatory Visit: Payer: Self-pay

## 2017-10-27 ENCOUNTER — Inpatient Hospital Stay: Payer: Medicare Other

## 2017-10-27 ENCOUNTER — Encounter: Payer: Self-pay | Admitting: Urgent Care

## 2017-10-27 ENCOUNTER — Encounter: Payer: Self-pay | Admitting: *Deleted

## 2017-10-27 VITALS — BP 170/79 | HR 79 | Temp 98.6°F | Resp 18 | Wt 164.3 lb

## 2017-10-27 DIAGNOSIS — C50912 Malignant neoplasm of unspecified site of left female breast: Secondary | ICD-10-CM

## 2017-10-27 DIAGNOSIS — N6321 Unspecified lump in the left breast, upper outer quadrant: Secondary | ICD-10-CM

## 2017-10-27 DIAGNOSIS — E876 Hypokalemia: Secondary | ICD-10-CM

## 2017-10-27 DIAGNOSIS — Z5111 Encounter for antineoplastic chemotherapy: Secondary | ICD-10-CM | POA: Diagnosis not present

## 2017-10-27 LAB — COMPREHENSIVE METABOLIC PANEL
ALT: 20 U/L (ref 0–44)
AST: 27 U/L (ref 15–41)
Albumin: 4.1 g/dL (ref 3.5–5.0)
Alkaline Phosphatase: 92 U/L (ref 38–126)
Anion gap: 9 (ref 5–15)
BUN: 20 mg/dL (ref 8–23)
CO2: 22 mmol/L (ref 22–32)
Calcium: 9.3 mg/dL (ref 8.9–10.3)
Chloride: 104 mmol/L (ref 98–111)
Creatinine, Ser: 0.92 mg/dL (ref 0.44–1.00)
GFR calc Af Amer: >60 mL/min (ref >60)
GFR calc non Af Amer: >60 mL/min (ref >60)
Glucose, Bld: 227 mg/dL — ABNORMAL HIGH (ref 70–99)
Potassium: 3.7 mmol/L (ref 3.5–5.1)
Sodium: 135 mmol/L (ref 135–145)
Total Bilirubin: 0.5 mg/dL (ref 0.3–1.2)
Total Protein: 7 g/dL (ref 6.5–8.1)

## 2017-10-27 LAB — CBC WITH DIFFERENTIAL/PLATELET
Basophils Absolute: 0 10*3/uL (ref 0–0.1)
Basophils Relative: 0 %
Eosinophils Absolute: 0 10*3/uL (ref 0–0.7)
Eosinophils Relative: 0 %
HCT: 35.9 % (ref 35.0–47.0)
Hemoglobin: 12.2 g/dL (ref 12.0–16.0)
Lymphocytes Relative: 9 %
Lymphs Abs: 1 10*3/uL (ref 1.0–3.6)
MCH: 31.8 pg (ref 26.0–34.0)
MCHC: 34.1 g/dL (ref 32.0–36.0)
MCV: 93.3 fL (ref 80.0–100.0)
Monocytes Absolute: 0.2 10*3/uL (ref 0.2–0.9)
Monocytes Relative: 1 %
Neutro Abs: 10 10*3/uL — ABNORMAL HIGH (ref 1.4–6.5)
Neutrophils Relative %: 90 %
Platelets: 244 10*3/uL (ref 150–440)
RBC: 3.84 MIL/uL (ref 3.80–5.20)
RDW: 13.6 % (ref 11.5–14.5)
WBC: 11.1 10*3/uL — ABNORMAL HIGH (ref 3.6–11.0)

## 2017-10-27 LAB — MAGNESIUM: Magnesium: 1.8 mg/dL (ref 1.7–2.4)

## 2017-10-27 MED ORDER — SODIUM CHLORIDE 0.9 % IV SOLN
600.0000 mg/m2 | Freq: Once | INTRAVENOUS | Status: AC
  Administered 2017-10-27: 1100 mg via INTRAVENOUS
  Filled 2017-10-27: qty 50

## 2017-10-27 MED ORDER — HEPARIN SOD (PORK) LOCK FLUSH 100 UNIT/ML IV SOLN
500.0000 [IU] | Freq: Once | INTRAVENOUS | Status: AC | PRN
  Administered 2017-10-27: 500 [IU]
  Filled 2017-10-27 (×2): qty 5

## 2017-10-27 MED ORDER — DOXORUBICIN HCL CHEMO IV INJECTION 2 MG/ML
60.0000 mg/m2 | Freq: Once | INTRAVENOUS | Status: AC
  Administered 2017-10-27: 110 mg via INTRAVENOUS
  Filled 2017-10-27: qty 55

## 2017-10-27 MED ORDER — SODIUM CHLORIDE 0.9 % IV SOLN
Freq: Once | INTRAVENOUS | Status: AC
  Administered 2017-10-27: 10:00:00 via INTRAVENOUS
  Filled 2017-10-27: qty 1000

## 2017-10-27 MED ORDER — PALONOSETRON HCL INJECTION 0.25 MG/5ML
0.2500 mg | Freq: Once | INTRAVENOUS | Status: AC
  Administered 2017-10-27: 0.25 mg via INTRAVENOUS
  Filled 2017-10-27: qty 5

## 2017-10-27 MED ORDER — SODIUM CHLORIDE 0.9% FLUSH
10.0000 mL | INTRAVENOUS | Status: DC | PRN
  Filled 2017-10-27: qty 10

## 2017-10-27 MED ORDER — SODIUM CHLORIDE 0.9 % IV SOLN
Freq: Once | INTRAVENOUS | Status: AC
  Administered 2017-10-27: 10:00:00 via INTRAVENOUS
  Filled 2017-10-27: qty 5

## 2017-10-27 NOTE — Progress Notes (Signed)
  Oncology Nurse Navigator Documentation  Navigator Location: [facility] (10/27/17 1400)   )Navigator Encounter Type: Clinic/MDC (10/27/17 1400)                       Treatment Phase: Post-Tx Follow-up (10/27/17 1400)                            Time Spent with Patient: 30 (10/27/17 1400)   Met patient during her chemotherapy treatment.  Offered support.  No needs at this time.

## 2017-10-27 NOTE — Progress Notes (Signed)
Here for follow up. Stated overall tired and R chest are sore after port placement last wed. Stated r arm pain also -dull sore pain -both areas. In good spirits.

## 2017-10-27 NOTE — Progress Notes (Signed)
[facility] day:  10/27/2017  Chief Complaint: Mariah Hogan is a 67 y.o. female with clinical stage IA left breast cancer who is seen for assessment prior to cycle #1 AC.  HPI:  The patient was last seen in the medical oncology clinic on 10/09/2017.  At that time, she was still felt fatigued.  She had a chronic cough. Appetite was "ok"; weight down 1 pound. Exam was unremarkable. WBC was 5900 ([place] 4000). Potassium 4.2. BUN 20 and creatinine 1.00 (CrCl  53.31m/min).   At last visit, [facility]  She discussed this with her daughter after clinic.  [facility] recommendations were similar.  We discussed port placement.  Echocardiogram done on 10/14/2017 revealed an EF of 55-60%.  She underwent port-a-cath placement on 10/21/2017 by Dr. BBary Castilla  During the interim, she has done well.  She notes some mild port pain.  She notes an allergic reaction to the tape.   Past Medical History:  Diagnosis Date  . Anemia   . Arthritis   . Asthma    H/O YEARS AGO-NOW HAS COUGHING SPELLS WHICH IS WHY SHE HAS HER ALBUTEROL INHALER  . Breast [place] [place]   left breast INVASIVE DUCTAL CARCINOMA.  . Cancer ([facility]    [phone] cell carcinoma  . Complication of anesthesia    HARD TIME WAKING UP  . Dysrhythmia    TACHYCARDIA  . GERD (gastroesophageal reflux disease)   . History of hiatal hernia    SMALL  . Hyperlipidemia   . Hypertension   . PONV (postoperative nausea and vomiting)     Past Surgical History:  Procedure Laterality Date  . ABDOMINAL HYSTERECTOMY  1998  . bladder tack  28101,7510 . BREAST BIOPSY Bilateral   . BREAST BIOPSY Left 1990  . BREAST BIOPSY Left 2018   core bx- neg  . 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  .Marland KitchenBREAST CYST EXCISION  x3  . BREAST SURGERY Left 02/13/14   Fibrocystic changes,  pseudo-angiomatous stromal hyperplasia. No atypia or malignancy.  . CARPAL TUNNEL RELEASE  x2  . CHOLECYSTECTOMY  2008  . COLONOSCOPY WITH PROPOFOL N/A 01/12/2015   Procedure: COLONOSCOPY WITH PROPOFOL;  Surgeon: RManya Silvas MD;  [facility]  Service: Endoscopy;  Laterality: N/A;  . ESOPHAGOGASTRODUODENOSCOPY (EGD) WITH PROPOFOL N/A 01/12/2015   Procedure: ESOPHAGOGASTRODUODENOSCOPY (EGD) WITH PROPOFOL;  Surgeon: RManya Silvas MD;  [facility];  Service: Endoscopy;  Laterality: N/A;  . NASAL RECONSTRUCTION  1983  . PORTACATH PLACEMENT N/A 10/21/2017   Procedure: INSERTION PORT-A-CATH;  Surgeon: BRobert Bellow MD;  [facility];  Service: General;  Laterality: N/A;  . SAVORY DILATION N/A 01/12/2015   Procedure: SAzzie AlmasDILATION;  Surgeon: RManya Silvas MD;  [facility];  Service: Endoscopy;  Laterality: N/A;  . TONSILLECTOMY AND ADENOIDECTOMY  1956    Family History  Problem Relation Age of Onset  . Breast cancer Sister 587      currently 653 . Breast cancer Paternal Grandmother 725      bilateral; deceased 895s  . Breast cancer Cousin 576      daughter of an unaffected maternal aunt  . Breast cancer Cousin 537      kidney cancer also; daughter of an unaffected maternal aunt  . Other Mother  TAH/BSO prior to menopause  . Prostate cancer Paternal Grandfather        age at dx unknown  . Breast cancer Maternal Aunt        age at dx unknown  . Breast cancer Cousin 26       daughter of an unaffected maternal aunt    Social History:  reports that she has never smoked. She has never used smokeless tobacco. She reports that she does not drink alcohol or use drugs.  Her daughter, Mariah Hogan, is a gastroenterology PA. She is a retired Customer service manager for 35 years. Patient denies known exposures to radiation on toxins. She is alone today.  Allergies:  Allergies  Allergen Reactions  . Penicillin G Shortness Of Breath, Swelling  and Rash    Has patient had a PCN reaction causing immediate rash, facial/tongue/throat swelling, SOB or lightheadedness with hypotension: Yes Has patient had a PCN reaction causing severe rash involving mucus membranes or skin necrosis: No Has patient had a PCN reaction that required hospitalization: No Has patient had a PCN reaction occurring within the last 10 years: No If all of the above answers are "NO", then may proceed with Cephalosporin use.  [name] [Docetaxel] Shortness Of Breath and Palpitations    Back pain Back Spasms  . Other Other (See Comments)    Dodecyl gallate: Positive patch test  Elta MD UV Clear SPF 46: Positive patch test  fragrance dodocylgallate dodocylgallate  . Parabens Other (See Comments)    Positive patch test   . Shellac   . Fluocinonide Rash  . Latex Rash  . Tape Dermatitis  . Triamcinolone Rash    Current Medications: Current Outpatient Medications  Medication Sig Dispense Refill  . atenolol (TENORMIN) 50 MG tablet Take 50 mg by mouth every morning.     . beclomethasone (QVAR) 40 MCG/ACT inhaler Inhale into the lungs.    . calcium carbonate (TUMS - DOSED IN MG ELEMENTAL CALCIUM) 500 MG chewable tablet Chew 1 tablet by mouth daily as needed for indigestion or heartburn.    . Calcium Carbonate-Vitamin D (CALCIUM 600+D PO) Take 1 tablet by mouth daily.    . Cholecalciferol (VITAMIN D3) 5000 units CAPS Take 5,000 Units by mouth daily.    . citalopram (CELEXA) 20 MG tablet Take 20 mg by mouth at bedtime.     Marland Kitchen dexamethasone (DECADRON) 4 MG tablet Take 2 tablets by mouth once a day on the day after chemotherapy and then take 2 tablets two times a day for 2 days. Take with food. 30 tablet 1  . lidocaine-prilocaine (EMLA) cream Apply to affected area once 30 g 3  . Multiple Vitamin (MULTI-VITAMINS) TABS Take by mouth.    . pantoprazole (PROTONIX) 40 MG tablet Take 40 mg by mouth 2 (two) times daily.     . valACYclovir (VALTREX) 1000 MG tablet Take  1,000 mg by mouth daily. Take 1000 mg in the morning , may take a second 1000 mg dose as needed for fever blisters     . valsartan (DIOVAN) 80 MG tablet Take 80 mg by mouth every morning.     Marland Kitchen albuterol (PROVENTIL HFA;VENTOLIN HFA) 108 (90 Base) MCG/ACT inhaler Inhale 2 puffs into the lungs every 6 (six) hours as needed for wheezing or shortness of breath. (Patient not taking: Reported on 10/27/2017) 1 Inhaler 1  . Desoximetasone (TOPICORT) 0.25 % ointment Apply topically.    . fluticasone (FLONASE) 50 MCG/ACT nasal spray Place 1 spray into both nostrils  daily as needed for allergies or rhinitis.    Marland Kitchen HYDROcodone-acetaminophen (NORCO/VICODIN) 5-325 MG tablet Take 1 tablet by mouth every 4 (four) hours as needed for moderate pain. (Patient not taking: Reported on 10/27/2017) 10 tablet 0  . ondansetron (ZOFRAN) 8 MG tablet Take 1 tablet (8 mg total) by mouth 2 (two) times daily as needed. Start on the third day after chemotherapy. (Patient not taking: Reported on 10/27/2017) 30 tablet 1   No current facility-administered medications for this visit.     Review of Systems  Constitutional: Positive for malaise/fatigue. Negative for chills, diaphoresis, fever and weight loss (up 1 pound).  HENT: Negative.  Negative for congestion, ear discharge, ear pain, nosebleeds, sore throat and tinnitus.        Runny nose.  Eyes: Negative.  Negative for blurred vision, double vision, photophobia, pain and discharge.  Respiratory: Positive for cough (chronic) and shortness of breath (exertional). Negative for hemoptysis, sputum production and stridor.   Cardiovascular: Negative for chest pain, palpitations, orthopnea, leg swelling and PND.  Gastrointestinal: Negative.  Negative for abdominal pain, blood in stool, constipation, diarrhea, heartburn, melena, nausea and vomiting.  Genitourinary: Negative.  Negative for dysuria, frequency, hematuria and urgency.  Musculoskeletal: Positive for back pain and joint pain  (hands and feet). Negative for falls, myalgias and neck pain.  Skin: Negative.  Negative for itching and rash.  Neurological: Negative for dizziness, tingling, tremors, sensory change, speech change, focal weakness, weakness and headaches.  Endo/Heme/Allergies: Does not bruise/bleed easily.  Psychiatric/Behavioral: Negative for depression, memory loss and suicidal ideas. The patient is not nervous/anxious and does not have insomnia.        (+) HYPERsomnia  All other systems reviewed and are negative.  Performance status (ECOG): 1 - Symptomatic but completely ambulatory  Vital Signs BP (!) 170/79 (BP Location: Left Arm, Patient Position: Sitting)   Pulse 79   Temp 98.6 F (37 C) (Tympanic)   Resp 18   Wt 164 lb 4.8 oz (74.5 kg)   BMI 26.93 kg/m   Physical Exam  Constitutional: She is oriented to person, place, and time and well-developed, well-nourished, and in no distress.  HENT:  Head: Normocephalic and atraumatic.  Shoulder length brown wig.  Eyes: Pupils are equal, round, and reactive to light. EOM are normal. No scleral icterus.  Blue eyes.  Neck: Normal range of motion. Neck supple. No tracheal deviation present. No thyromegaly present.  Cardiovascular: Normal rate, regular rhythm and normal heart sounds. Exam reveals no gallop and no friction rub.  No murmur heard. Pulmonary/Chest: Effort normal and breath sounds normal. No respiratory distress. She has no wheezes. She has no rales.  Abdominal: Soft. Bowel sounds are normal. She exhibits no distension. There is no tenderness.  Musculoskeletal: Normal range of motion. She exhibits no edema or tenderness.  Lymphadenopathy:    She has no cervical adenopathy.    She has no axillary adenopathy.       Right: No inguinal and no supraclavicular adenopathy present.       Left: No inguinal and no supraclavicular adenopathy present.  Neurological: She is alert and oriented to person, place, and time.  Skin: Skin is warm and dry. No  rash noted. No erythema. No pallor.  Psychiatric: Mood, affect and judgment normal.  Nursing note and vitals reviewed.   Infusion on 10/27/2017  Component Date Value Ref Range Status  . Magnesium 10/27/2017 1.8  1.7 - 2.4 mg/dL Final   Performed at [facility], [address]  [address], [place], [place] [address]  . Sodium 10/27/2017 135  135 - 145 mmol/L Final  . Potassium 10/27/2017 3.7  3.5 - 5.1 mmol/L Final  . Chloride 10/27/2017 104  98 - 111 mmol/L Final  . CO2 10/27/2017 22  22 - 32 mmol/L Final  . Glucose, Bld 10/27/2017 227* 70 - 99 mg/dL Final  . BUN 10/27/2017 20  8 - 23 mg/dL Final  . Creatinine, Ser 10/27/2017 0.92  0.44 - 1.00 mg/dL Final  . Calcium 10/27/2017 9.3  8.9 - 10.3 mg/dL Final  . Total Protein 10/27/2017 7.0  6.5 - 8.1 g/dL Final  . Albumin 10/27/2017 4.1  3.5 - 5.0 g/dL Final  . AST 10/27/2017 27  15 - 41 U/L Final  . ALT 10/27/2017 20  0 - 44 U/L Final  . Alkaline Phosphatase 10/27/2017 92  38 - 126 U/L Final  . Total Bilirubin 10/27/2017 0.5  0.3 - 1.2 mg/dL Final  . GFR calc non Af Amer 10/27/2017 >60  >60 mL/min Final  . GFR calc Af Amer 10/27/2017 >60  >60 mL/min Final   Comment: (NOTE) The eGFR has been calculated using the CKD EPI equation. This calculation has not been validated in all clinical situations. eGFR's persistently <60 mL/min signify possible Chronic Kidney Disease.   Georgiann Hahn gap 10/27/2017 9  5 - 15 Final   Performed at [facility], [address] [place], [place] [address]  . WBC 10/27/2017 11.1* 3.6 - 11.0 K/uL Final  . RBC 10/27/2017 3.84  3.80 - 5.20 MIL/uL Final  . Hemoglobin 10/27/2017 12.2  12.0 - 16.0 g/dL Final  . HCT 10/27/2017 35.9  35.0 - 47.0 % Final  . MCV 10/27/2017 93.3  80.0 - 100.0 fL Final  . MCH 10/27/2017 31.8  26.0 - 34.0 pg Final  . MCHC 10/27/2017 34.1  32.0 - 36.0 g/dL Final  . RDW 10/27/2017 13.6  11.5 - 14.5 % Final  . Platelets 10/27/2017 244  150 - 440 K/uL Final  . Neutrophils Relative %  10/27/2017 90  % Final  . Neutro Abs 10/27/2017 10.0* 1.4 - 6.5 K/uL Final  . Lymphocytes Relative 10/27/2017 9  % Final  . Lymphs Abs 10/27/2017 1.0  1.0 - 3.6 K/uL Final  . Monocytes Relative 10/27/2017 1  % Final  . Monocytes Absolute 10/27/2017 0.2  0.2 - 0.9 K/uL Final  . Eosinophils Relative 10/27/2017 0  % Final  . Eosinophils Absolute 10/27/2017 0.0  0 - 0.7 K/uL Final  . Basophils Relative 10/27/2017 0  % Final  . Basophils Absolute 10/27/2017 0.0  0 - 0.1 K/uL Final   Performed at [facility], [address] [place], [place] [address]    Assessment:  Mariah Hogan is a 67 y.o. female with clinical stage IA left breast cancer s/p biopsy on 08/04/2017.  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  Tumor was ER + (95%), PR+ (20%), and Her 2 neu - by FISH.  Ki67 was 80%.  CA27.29 was 17.3 on 08/06/2017.  MammaPrint was high risk luminal-type B with a 10 year risk of recurrence untreated of 29% and 94.6% of distant metastasis free interval with chemotherapy + hormonal therapy.  Left breast ultrasound on 08/04/2017 revealed a 1.65 x 1.69 x 1.86 cm multi-lobulated hypoechoic mass in the upper outer quadrant of the left breast in the 2 o'clock position 7 cm from the nipple.  Bilateral breast MRI on 08/20/2017 revealed a 2.4  cm enhancing irregular mass in the outer left breast.  There were several prominent lymph nodes with borderline thicked cortices between 3-4 mm, but normal morphology.  There was a 5 mm enhancing mass in the anterior, retroareolar region of the right breast  RIGHT breast retroareolar biopsy on 09/02/2017 revealed no evidence of malignancy.  There was fibrocystic change and usual ductal hyperplasia.  Chest CT on 09/23/2016 revealed a 4 mm RUL subpleural nodule.  Chest CT on 08/17/2017 revealed a small 4 mm RUL nodule unchanged in size.  She received neoadjuvant Femara (08/12/2017 - 08/31/2017) secondary to planned a delay in  surgery (mastectomy with immediate reconstruction).  Femara was discontinued after high risk Mammaprint returned.  She received cycle #1 neoadjuvant Taxotere and Cytoxan (09/03/2017). Cycle #2 on 10/01/2017 was truncated after approximately 15 minutes of Taxotere infusion due to a reaction (back pain, SOB, shaking).  Echo on 10/14/2017 revealed an EF of 55-60%,  Bone density on 08/17/2017 revealed osteopenia with a T-score of -1.5 in the left femoral neck and -0.7 in the AP spine L1-L4.  She has a significant family history of breast cancer (paternal grandmother, sister, and 3 maternal cousins).   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  She was [facility] to [facility] from 09/11/[facility] - 09/15/2017 with enteropathogenic E.Coli (EPEC) and associated acute renal failure (creatinine 1.87; CrCl 28.3 ml/min). She was treated with intravenous azithromycin and ceftriaxone.   Symptomatically, she is doing well.  She has a chronic cough.  Exam is unremarkable. WBC 11,100 (ANC 10,000). Potassium 3.7. BUN 20 and creatinine 0.92.   Plan: 1. Labs today: CBC with diff, CMP, Mg. 2. LEFT breast cancer - treatment ongoing  Discuss recent pre-treatment echocardiogram. EF was normal.   Review plans for change in therapy from TC to [facility] chemotherapy. Patient will receive treatment every 14 - 21 days based on tolerability for 3 cycles. Sides effects reviewed in detail and questions fielded. Patient provides verbal consent to begin treatment today as planned.   Labs reviewed. Blood counts stable and adequate enough for treatment. Will proceed with cycle #1 AC chemotherapy.  Discuss symptom management.  Patient has antiemetics and pain medications at home to use on a PRN basis. Patient  advising that the  prescribed interventions are adequate at this point. Continue all medications as previously prescribed.  3. HYPOkalemia -  stable  Potassium level is 3.7 today. Hypokalemia has improved with the change in her antihypertensive medication to Valsartan. Will continue routine potassium monitoring.  4. Cough - chronic  Continues to have chronic non-productive cough. No pleuritic chest pain. (+) exertional SOB. No Fevers. Encouraged to follow up with PCP. 5. RTC tomorrow for [employer]. 6. RTC on 11/05/2017 for MD nadir assessment and labs (CBC with diff, BMP). 7. RTC on 11/10/2017 or 11/17/2017 for MD assessment, labs (CBC with diff, CMP, Mg) and cycle #2 AC.   Honor Loh, NP  10/27/2017, 9:34 AM   I saw and evaluated the patient, participating in the key portions of the service and reviewing pertinent diagnostic studies and records.  I reviewed the nurse practitioner's note and agree with the findings and the plan.  The assessment and plan were discussed with the patient.  Multiple questions were asked by the patient and answered.   Nolon Stalls, MD 10/27/2017,9:34 AM

## 2017-10-28 ENCOUNTER — Inpatient Hospital Stay: Payer: Medicare Other

## 2017-10-28 DIAGNOSIS — Z5111 Encounter for antineoplastic chemotherapy: Secondary | ICD-10-CM | POA: Diagnosis not present

## 2017-10-28 DIAGNOSIS — C50912 Malignant neoplasm of unspecified site of left female breast: Secondary | ICD-10-CM

## 2017-10-28 MED ORDER — PEGFILGRASTIM-CBQV 6 MG/0.6ML ~~LOC~~ SOSY
6.0000 mg | PREFILLED_SYRINGE | Freq: Once | SUBCUTANEOUS | Status: AC
  Administered 2017-10-28: 6 mg via SUBCUTANEOUS
  Filled 2017-10-28: qty 0.6

## 2017-10-28 NOTE — Anesthesia Postprocedure Evaluation (Signed)
Anesthesia Post Note  Patient: Mariah Hogan  Procedure(s) Performed: INSERTION PORT-A-CATH (N/A )  Patient location during evaluation: PACU Anesthesia Type: General Level of consciousness: awake and alert Pain management: pain level controlled Vital Signs Assessment: post-procedure vital signs reviewed and stable Respiratory status: spontaneous breathing, nonlabored ventilation and respiratory function stable Cardiovascular status: blood pressure returned to baseline and stable Postop Assessment: no apparent nausea or vomiting Anesthetic complications: no     Last Vitals:  Vitals:   10/21/17 1545 10/21/17 1603  BP: (!) 174/71 (!) 171/75  Pulse: (!) 59 66  Resp: 16 16  Temp: 36.9 C   SpO2: 98% 98%    Last Pain:  Vitals:   10/22/17 1104  TempSrc:   PainSc: 2                  Alphonsus Sias

## 2017-11-01 ENCOUNTER — Encounter: Payer: Self-pay | Admitting: Hematology and Oncology

## 2017-11-03 ENCOUNTER — Telehealth: Payer: Self-pay | Admitting: *Deleted

## 2017-11-03 MED ORDER — FLUCONAZOLE 100 MG PO TABS: ORAL_TABLET | ORAL | 0 refills | Status: DC

## 2017-11-03 NOTE — Telephone Encounter (Signed)
Per VO Dr Mike Gip, Fluconazole 200 mg on day one then 100 mg days 2 - 5. Prescription escribed. Patient informed of this

## 2017-11-03 NOTE — Telephone Encounter (Addendum)
Patient called reporting that she has thrush again after her chemotherapy treatment a week ago, she has been using mouthwash four times a day, but the thrush is getting worse. She states last time she was given a pill. In reviewing her chart, she was given Diflucan 200 mg tabs daily times five days September 07, 2017. Please advise.

## 2017-11-05 ENCOUNTER — Inpatient Hospital Stay: Payer: Medicare Other

## 2017-11-05 ENCOUNTER — Encounter: Payer: Self-pay | Admitting: Hematology and Oncology

## 2017-11-05 ENCOUNTER — Inpatient Hospital Stay (HOSPITAL_BASED_OUTPATIENT_CLINIC_OR_DEPARTMENT_OTHER): Payer: Medicare Other | Admitting: Hematology and Oncology

## 2017-11-05 ENCOUNTER — Other Ambulatory Visit: Payer: Self-pay

## 2017-11-05 VITALS — BP 115/76 | HR 83 | Temp 98.1°F | Resp 18 | Wt 157.4 lb

## 2017-11-05 DIAGNOSIS — E876 Hypokalemia: Secondary | ICD-10-CM | POA: Diagnosis not present

## 2017-11-05 DIAGNOSIS — M549 Dorsalgia, unspecified: Secondary | ICD-10-CM

## 2017-11-05 DIAGNOSIS — B370 Candidal stomatitis: Secondary | ICD-10-CM | POA: Insufficient documentation

## 2017-11-05 DIAGNOSIS — R197 Diarrhea, unspecified: Secondary | ICD-10-CM

## 2017-11-05 DIAGNOSIS — C50912 Malignant neoplasm of unspecified site of left female breast: Secondary | ICD-10-CM

## 2017-11-05 DIAGNOSIS — Z5111 Encounter for antineoplastic chemotherapy: Secondary | ICD-10-CM | POA: Diagnosis not present

## 2017-11-05 LAB — CBC WITH DIFFERENTIAL/PLATELET
Basophils Absolute: 0 10*3/uL (ref 0–0.1)
Basophils Relative: 1 %
Eosinophils Absolute: 0 10*3/uL (ref 0–0.7)
Eosinophils Relative: 1 %
HCT: 37.7 % (ref 35.0–47.0)
Hemoglobin: 12.8 g/dL (ref 12.0–16.0)
Lymphocytes Relative: 28 %
Lymphs Abs: 1.2 10*3/uL (ref 1.0–3.6)
MCH: 31.8 pg (ref 26.0–34.0)
MCHC: 33.9 g/dL (ref 32.0–36.0)
MCV: 94 fL (ref 80.0–100.0)
Monocytes Absolute: 0.2 10*3/uL (ref 0.2–0.9)
Monocytes Relative: 5 %
Neutro Abs: 2.9 10*3/uL (ref 1.4–6.5)
Neutrophils Relative %: 65 %
Platelets: 128 10*3/uL — ABNORMAL LOW (ref 150–440)
RBC: 4.02 MIL/uL (ref 3.80–5.20)
RDW: 13.3 % (ref 11.5–14.5)
WBC: 4.5 10*3/uL (ref 3.6–11.0)

## 2017-11-05 LAB — BASIC METABOLIC PANEL
Anion gap: 7 (ref 5–15)
BUN: 17 mg/dL (ref 8–23)
CO2: 27 mmol/L (ref 22–32)
Calcium: 8.8 mg/dL — ABNORMAL LOW (ref 8.9–10.3)
Chloride: 99 mmol/L (ref 98–111)
Creatinine, Ser: 1.03 mg/dL — ABNORMAL HIGH (ref 0.44–1.00)
GFR calc Af Amer: >60 mL/min (ref >60)
GFR calc non Af Amer: 54 mL/min — ABNORMAL LOW (ref >60)
Glucose, Bld: 135 mg/dL — ABNORMAL HIGH (ref 70–99)
Potassium: 4.2 mmol/L (ref 3.5–5.1)
Sodium: 133 mmol/L — ABNORMAL LOW (ref 135–145)

## 2017-11-05 MED ORDER — HYDROCODONE-ACETAMINOPHEN 5-325 MG PO TABS: 1.0000 | ORAL_TABLET | Freq: Four times a day (QID) | ORAL | 0 refills | Status: DC | PRN

## 2017-11-05 NOTE — Progress Notes (Signed)
[facility] day:  11/05/2017  Chief Complaint: Mariah Hogan is a 67 y.o. female with clinical stage IA left breast cancer who is seen for assessment on day 10 s/p cycle #1 AC.  HPI:  The patient was last seen in the medical oncology clinic on 10/27/2017.  At that time, she was doing well.  She noted some soreness after port placement.  She received cycle #1 AC with Udencya support.  She contacted the clinic on 11/03/2017 with thrush not improving with Nystatin/Magic mouthwash.  She was prescribed fluconazole.  During the interim, she felt good until 3 days ago when she developed back pain due to [place].   She has had diarrhea up to 10-15 bowel movements/day.  She has not used any Imodium.  She describes nausea but no vomiting.  She has lost 7 pounds.   Past Medical History:  Diagnosis Date  . Anemia   . Arthritis   . Asthma    H/O YEARS AGO-NOW HAS COUGHING SPELLS WHICH IS WHY SHE HAS HER ALBUTEROL INHALER  . Breast cancer ([place]) 08/04/2017   left breast INVASIVE DUCTAL CARCINOMA.  . Cancer ([place])    [phone] basal cell carcinoma  . Complication of anesthesia    HARD TIME WAKING UP  . Dysrhythmia    TACHYCARDIA  . GERD (gastroesophageal reflux disease)   . History of hiatal hernia    SMALL  . Hyperlipidemia   . Hypertension   . PONV (postoperative nausea and vomiting)     Past Surgical History:  Procedure Laterality Date  . ABDOMINAL HYSTERECTOMY  1998  . bladder tack  [id number]  . BREAST BIOPSY Bilateral   . BREAST BIOPSY Left 1990  . BREAST BIOPSY Left 2018   core bx- neg  . 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  Marland Kitchen BREAST CYST EXCISION  x3  . BREAST SURGERY Left 02/13/14   Fibrocystic changes, pseudo-angiomatous stromal hyperplasia. No atypia or malignancy.  . CARPAL TUNNEL RELEASE  x2  . CHOLECYSTECTOMY  2008  . COLONOSCOPY WITH PROPOFOL N/A 01/12/2015   Procedure:  COLONOSCOPY WITH PROPOFOL;  Surgeon: Manya Silvas, MD;  Location: [facility] ENDOSCOPY;  Service: Endoscopy;  Laterality: N/A;  . ESOPHAGOGASTRODUODENOSCOPY (EGD) WITH PROPOFOL N/A 01/12/2015   Procedure: ESOPHAGOGASTRODUODENOSCOPY (EGD) WITH PROPOFOL;  Surgeon: Manya Silvas, MD;  Location: [facility] ENDOSCOPY;  Service: Endoscopy;  Laterality: N/A;  . NASAL RECONSTRUCTION  1983  . PORTACATH PLACEMENT N/A 10/21/2017   Procedure: INSERTION PORT-A-CATH;  Surgeon: Robert Bellow, MD;  Location: [facility];  Service: General;  Laterality: N/A;  . SAVORY DILATION N/A 01/12/2015   Procedure: Azzie Almas DILATION;  Surgeon: Manya Silvas, MD;  Location: [facility] ENDOSCOPY;  Service: Endoscopy;  Laterality: N/A;  . TONSILLECTOMY AND ADENOIDECTOMY  1956    Family History  Problem Relation Age of Onset  . Breast cancer Sister 21       currently 4  . Breast cancer Paternal Grandmother 55       bilateral; deceased 30s   . Breast cancer Cousin 22       daughter of an unaffected maternal aunt  . Breast cancer Cousin 23       kidney cancer also; daughter of an unaffected maternal aunt  . Other Mother        TAH/BSO prior to menopause  . Prostate cancer Paternal Grandfather        age at dx unknown  .  Breast cancer Maternal Aunt        age at dx unknown  . Breast cancer Cousin 45       daughter of an unaffected maternal aunt    Social History:  reports that she has never smoked. She has never used smokeless tobacco. She reports that she does not drink alcohol or use drugs.  Her daughter, Mariah Hogan, is a gastroenterology PA. She is a retired Customer service manager for 35 years. Patient denies known exposures to radiation on toxins. She is accompanied by her daughter today.  Allergies:  Allergies  Allergen Reactions  . Penicillin G Shortness Of Breath, Swelling and Rash    Has patient had a PCN reaction causing immediate rash, facial/tongue/throat swelling, SOB or lightheadedness with hypotension: Yes Has patient  had a PCN reaction causing severe rash involving mucus membranes or skin necrosis: No Has patient had a PCN reaction that required hospitalization: No Has patient had a PCN reaction occurring within the last 10 years: No If all of the above answers are "NO", then may proceed with Cephalosporin use.  [name] [Docetaxel] Shortness Of Breath and Palpitations    Back pain Back Spasms  . Other Other (See Comments)    Dodecyl gallate: Positive patch test  Elta MD UV Clear SPF 46: Positive patch test  fragrance dodocylgallate dodocylgallate  . Parabens Other (See Comments)    Positive patch test   . Shellac   . Fluocinonide Rash  . Latex Rash  . Tape Dermatitis  . Triamcinolone Rash    Current Medications: Current Outpatient Medications  Medication Sig Dispense Refill  . atenolol (TENORMIN) 50 MG tablet Take 50 mg by mouth every morning.     . citalopram (CELEXA) 20 MG tablet Take 20 mg by mouth at bedtime.     . fluconazole (DIFLUCAN) 100 MG tablet Take 2 tablets (200 mg) on day 1 then take 1 tablet (100 mg) on days 2 - 5 6 tablet 0  . lidocaine-prilocaine (EMLA) cream Apply to affected area once 30 g 3  . loratadine (CLARITIN) 10 MG tablet Take 10 mg by mouth daily.    . ondansetron (ZOFRAN) 8 MG tablet Take 1 tablet (8 mg total) by mouth 2 (two) times daily as needed. Start on the third day after chemotherapy. 30 tablet 1  . pantoprazole (PROTONIX) 40 MG tablet Take 40 mg by mouth 2 (two) times daily.     . valsartan (DIOVAN) 80 MG tablet Take 80 mg by mouth every morning.     Marland Kitchen albuterol (PROVENTIL HFA;VENTOLIN HFA) 108 (90 Base) MCG/ACT inhaler Inhale 2 puffs into the lungs every 6 (six) hours as needed for wheezing or shortness of breath. (Patient not taking: Reported on 10/27/2017) 1 Inhaler 1  . beclomethasone (QVAR) 40 MCG/ACT inhaler Inhale into the lungs.    . calcium carbonate (TUMS - DOSED IN MG ELEMENTAL CALCIUM) 500 MG chewable tablet Chew 1 tablet by mouth daily as  needed for indigestion or heartburn.    . Calcium Carbonate-Vitamin D (CALCIUM 600+D PO) Take 1 tablet by mouth daily.    . Cholecalciferol (VITAMIN D3) 5000 units CAPS Take 5,000 Units by mouth daily.    . Desoximetasone (TOPICORT) 0.25 % ointment Apply topically.    . fluticasone (FLONASE) 50 MCG/ACT nasal spray Place 1 spray into both nostrils daily as needed for allergies or rhinitis.    Marland Kitchen HYDROcodone-acetaminophen (NORCO/VICODIN) 5-325 MG tablet Take 1 tablet by mouth every 4 (four) hours as needed for moderate  pain. (Patient not taking: Reported on 10/27/2017) 10 tablet 0  . Multiple Vitamin (MULTI-VITAMINS) TABS Take by mouth.    . valACYclovir (VALTREX) 1000 MG tablet Take 1,000 mg by mouth daily. Take 1000 mg in the morning , may take a second 1000 mg dose as needed for fever blisters      No current facility-administered medications for this visit.     Review of Systems  Constitutional: Positive for malaise/fatigue and weight loss (7 pounds). Negative for diaphoresis and fever.  HENT: Negative.  Negative for congestion, ear discharge, ear pain, nosebleeds, sinus pain, sore throat and tinnitus.   Eyes: Negative.  Negative for blurred vision, double vision, photophobia, pain and discharge.  Respiratory: Positive for shortness of breath (exertional). Negative for hemoptysis, sputum production and wheezing. Cough: chronic.   Cardiovascular: Negative for chest pain, palpitations (intermittent), orthopnea, claudication, leg swelling and PND.  Gastrointestinal: Positive for diarrhea and nausea. Negative for abdominal pain, blood in stool, constipation, heartburn, melena and vomiting.       Appetite 50%.  Genitourinary: Negative.  Negative for dysuria, frequency, hematuria and urgency.  Musculoskeletal: Positive for back pain (secondary to Udencya) and joint pain (hands and feet). Negative for falls, myalgias and neck pain.  Skin: Negative for itching and rash.  Neurological: Negative for  dizziness, tingling, tremors, sensory change, speech change, focal weakness, weakness (general) and headaches.  Endo/Heme/Allergies: Does not bruise/bleed easily.  Psychiatric/Behavioral: Negative for depression and memory loss. The patient is not nervous/anxious and does not have insomnia.        (+) HYPERsomnia  All other systems reviewed and are negative.  Performance status (ECOG): 1 - Symptomatic but completely ambulatory  Vital Signs BP 115/76 (BP Location: Right Arm, Patient Position: Sitting)   Pulse 83   Temp 98.1 F (36.7 C) (Tympanic)   Resp 18   Wt 157 lb 6 oz (71.4 kg)   BMI 25.79 kg/m   Physical Exam  Constitutional: She is oriented to person, place, and time and well-developed, well-nourished, and in no distress.  HENT:  Head: Normocephalic. Hair is abnormal (chemotherapy induced alopecia - wearing a wig).  Mouth/Throat: No oropharyngeal exudate.  No thrush.  Eyes: Pupils are equal, round, and reactive to light. Conjunctivae and EOM are normal. No scleral icterus.  Blue eyes  Neck: Normal range of motion. Neck supple. No JVD present.  Cardiovascular: Normal rate, regular rhythm and normal heart sounds. Exam reveals no gallop and no friction rub.  No murmur heard. Pulmonary/Chest: Effort normal and breath sounds normal. No respiratory distress. She has no wheezes. She has no rales.  Abdominal: Soft. Bowel sounds are normal. She exhibits no distension and no mass. There is no tenderness. There is no rebound and no guarding.  Musculoskeletal: Normal range of motion. She exhibits no edema or tenderness.  Lymphadenopathy:    She has no cervical adenopathy.    She has no axillary adenopathy.       Right: No inguinal and no supraclavicular adenopathy present.       Left: No inguinal and no supraclavicular adenopathy present.  Neurological: She is alert and oriented to person, place, and time.  Skin: Skin is warm and dry. No rash noted. No erythema.  Psychiatric: Mood,  affect and judgment normal.  Nursing note and vitals reviewed.   Orders Only on 11/05/2017  Component Date Value Ref Range Status  . Sodium 11/05/2017 133* 135 - 145 mmol/L Final  . Potassium 11/05/2017 4.2  3.5 - 5.1 mmol/L  Final  . Chloride 11/05/2017 99  98 - 111 mmol/L Final  . CO2 11/05/2017 27  22 - 32 mmol/L Final  . Glucose, Bld 11/05/2017 135* 70 - 99 mg/dL Final  . BUN 11/05/2017 17  8 - 23 mg/dL Final  . Creatinine, Ser 11/05/2017 1.03* 0.44 - 1.00 mg/dL Final  . Calcium 11/05/2017 8.8* 8.9 - 10.3 mg/dL Final  . GFR calc non Af Amer 11/05/2017 54* >60 mL/min Final  . GFR calc Af Amer 11/05/2017 >60  >60 mL/min Final   Comment: (NOTE) The eGFR has been calculated using the CKD EPI equation. This calculation has not been validated in all clinical situations. eGFR's persistently <60 mL/min signify possible Chronic Kidney Disease.   Georgiann Hahn gap 11/05/2017 7  5 - 15 Final   Performed at [facility], [address] [place], [place] [address]  . WBC 11/05/2017 4.5  3.6 - 11.0 K/uL Final  . RBC 11/05/2017 4.02  3.80 - 5.20 MIL/uL Final  . Hemoglobin 11/05/2017 12.8  12.0 - 16.0 g/dL Final  . HCT 11/05/2017 37.7  35.0 - 47.0 % Final  . MCV 11/05/2017 94.0  80.0 - 100.0 fL Final  . MCH 11/05/2017 31.8  26.0 - 34.0 pg Final  . MCHC 11/05/2017 33.9  32.0 - 36.0 g/dL Final  . RDW 11/05/2017 13.3  11.5 - 14.5 % Final  . Platelets 11/05/2017 128* 150 - 440 K/uL Final  . Neutrophils Relative % 11/05/2017 65  % Final  . Neutro Abs 11/05/2017 2.9  1.4 - 6.5 K/uL Final  . Lymphocytes Relative 11/05/2017 28  % Final  . Lymphs Abs 11/05/2017 1.2  1.0 - 3.6 K/uL Final  . Monocytes Relative 11/05/2017 5  % Final  . Monocytes Absolute 11/05/2017 0.2  0.2 - 0.9 K/uL Final  . Eosinophils Relative 11/05/2017 1  % Final  . Eosinophils Absolute 11/05/2017 0.0  0 - 0.7 K/uL Final  . Basophils Relative 11/05/2017 1  % Final  . Basophils Absolute 11/05/2017 0.0  0 - 0.1 K/uL Final    Performed at [facility], [address] [place], [place] [address]    Assessment:  DAJANAE BROPHY is a 67 y.o. female with clinical stage IA left breast cancer s/p biopsy on 08/04/2017.  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  Tumor was ER + (95%), PR+ (20%), and Her 2 neu - by FISH.  Ki67 was 80%.  CA27.29 was 17.3 on 08/06/2017.  MammaPrint was high risk luminal-type B with a 10 year risk of recurrence untreated of 29% and 94.6% of distant metastasis free interval with chemotherapy + hormonal therapy.  Left breast ultrasound on 08/04/2017 revealed a 1.65 x 1.69 x 1.86 cm multi-lobulated hypoechoic mass in the upper outer quadrant of the left breast in the 2 o'clock position 7 cm from the nipple.  Bilateral breast MRI on 08/20/2017 revealed a 2.4 cm enhancing irregular mass in the outer left breast.  There were several prominent lymph nodes with borderline thicked cortices between 3-4 mm, but normal morphology.  There was a 5 mm enhancing mass in the anterior, retroareolar region of the right breast  RIGHT breast retroareolar biopsy on 09/02/2017 revealed no evidence of malignancy.  There was fibrocystic change and usual ductal hyperplasia.  Chest CT on 09/23/2016 revealed a 4 mm RUL subpleural nodule.  Chest CT on 08/17/2017 revealed a small 4 mm RUL nodule unchanged in size.  She received neoadjuvant Femara (08/12/2017 -  08/31/2017) secondary to planned a delay in surgery (mastectomy with immediate reconstruction).  Femara was discontinued after high risk Mammaprint returned.  She received cycle #1 neoadjuvant Taxotere and Cytoxan (09/03/2017). Cycle #2 on 10/01/2017 was truncated after approximately 15 minutes of Taxotere infusion due to a reaction (back pain, SOB, shaking).  She is day 10 s/p cycle #1 AC (10/27/2017) with Udencya support.  She has had thrush treated with Nystatin and fluconazole.  Echo on 10/14/2017 revealed an EF of  55-60%,  Bone density on 08/17/2017 revealed osteopenia with a T-score of -1.5 in the left femoral neck and -0.7 in the AP spine L1-L4.  She has a significant family history of breast cancer (paternal grandmother, sister, and 3 maternal cousins).   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  She was [facility] to [facility] from 09/11/2017 - 09/15/2017 with enteropathogenic E.Coli (EPEC) and associated acute renal failure (creatinine 1.87; CrCl 28.3 ml/min). She was treated with intravenous azithromycin and ceftriaxone.   Symptomatically, she is fatigued.  She had had frequent episodes of diarrhea.  She has lost 7 pounds.  WBC is 4500 ([place] 2900).  Potassium is 4.2.  Plan: 1. Labs today:  CBC with diff, BMP. 2. LEFT breast cancer - treatment ongoing  Discuss interval side effects associated with cycle #1 AC.  Counts are good with Udencya support.  Discuss use of Claritin for Udencya induced bone/back pain.  Rx: Norco 5/325 1 tablet po q 6 hours prn pain not relived by Tylenol (dis: #10).  Discuss symptoms management.  Patient has anti-emetics at home to be use prn.  Medications are adequate.  Continue all medications as previously prescribed. 3. Diarrhea - new  Etiology unclear.  Not typically associated with [facility] chemotherapy.   Send stool for C diff and GI panel by PCR.  Discuss importance of fluids and use of Imodium. 4. Thrush - resolved  Etiology secondary to Decadron.  Discuss early management with future cycles.  5. RTC on 11/17/2017 for MD assessment, labs (CBC with diff, CMP, Mg), and cycle #2 AC.   Lequita Asal, MD  11/05/2017, 11:47 AM

## 2017-11-05 NOTE — Progress Notes (Signed)
Pt in for follow up, daughter with pt.  Pt reports last 3 days had diarrhea "10-15 a day" Pt did not take any antidiarrheal medication.  Reports also having nausea but no vomiting. States no diarrhea or nausea today.  Pt also reports being" achy" "probably due to neulasta".  Pt denies pain today but does states 'having back spasms frequently".

## 2017-11-13 ENCOUNTER — Encounter: Payer: Self-pay | Admitting: General Surgery

## 2017-11-17 ENCOUNTER — Encounter: Payer: Self-pay | Admitting: Hematology and Oncology

## 2017-11-17 ENCOUNTER — Inpatient Hospital Stay: Payer: Medicare Other | Attending: Hematology and Oncology

## 2017-11-17 ENCOUNTER — Inpatient Hospital Stay: Payer: Medicare Other

## 2017-11-17 ENCOUNTER — Inpatient Hospital Stay (HOSPITAL_BASED_OUTPATIENT_CLINIC_OR_DEPARTMENT_OTHER): Payer: Medicare Other | Admitting: Hematology and Oncology

## 2017-11-17 ENCOUNTER — Other Ambulatory Visit: Payer: Self-pay | Admitting: Hematology and Oncology

## 2017-11-17 VITALS — BP 162/87 | HR 74 | Temp 98.3°F | Resp 18 | Wt 161.5 lb

## 2017-11-17 DIAGNOSIS — C50912 Malignant neoplasm of unspecified site of left female breast: Secondary | ICD-10-CM

## 2017-11-17 DIAGNOSIS — Z5111 Encounter for antineoplastic chemotherapy: Secondary | ICD-10-CM | POA: Diagnosis present

## 2017-11-17 DIAGNOSIS — C50412 Malignant neoplasm of upper-outer quadrant of left female breast: Secondary | ICD-10-CM

## 2017-11-17 DIAGNOSIS — Z5189 Encounter for other specified aftercare: Secondary | ICD-10-CM | POA: Diagnosis not present

## 2017-11-17 DIAGNOSIS — E876 Hypokalemia: Secondary | ICD-10-CM

## 2017-11-17 LAB — COMPREHENSIVE METABOLIC PANEL
ALT: 18 U/L (ref 0–44)
AST: 26 U/L (ref 15–41)
Albumin: 3.9 g/dL (ref 3.5–5.0)
Alkaline Phosphatase: 72 U/L (ref 38–126)
Anion gap: 8 (ref 5–15)
BUN: 15 mg/dL (ref 8–23)
CO2: 26 mmol/L (ref 22–32)
Calcium: 9.1 mg/dL (ref 8.9–10.3)
Chloride: 104 mmol/L (ref 98–111)
Creatinine, Ser: 0.97 mg/dL (ref 0.44–1.00)
GFR calc Af Amer: >60 mL/min (ref >60)
GFR calc non Af Amer: 58 mL/min — ABNORMAL LOW (ref >60)
Glucose, Bld: 119 mg/dL — ABNORMAL HIGH (ref 70–99)
Potassium: 3.4 mmol/L — ABNORMAL LOW (ref 3.5–5.1)
Sodium: 138 mmol/L (ref 135–145)
Total Bilirubin: 0.7 mg/dL (ref 0.3–1.2)
Total Protein: 6.3 g/dL — ABNORMAL LOW (ref 6.5–8.1)

## 2017-11-17 LAB — CBC WITH DIFFERENTIAL/PLATELET
Basophils Absolute: 0 10*3/uL (ref 0–0.1)
Basophils Relative: 1 %
Eosinophils Absolute: 0 10*3/uL (ref 0–0.7)
Eosinophils Relative: 1 %
HCT: 35.2 % (ref 35.0–47.0)
Hemoglobin: 12 g/dL (ref 12.0–16.0)
Lymphocytes Relative: 19 %
Lymphs Abs: 1.3 10*3/uL (ref 1.0–3.6)
MCH: 31.6 pg (ref 26.0–34.0)
MCHC: 34 g/dL (ref 32.0–36.0)
MCV: 93 fL (ref 80.0–100.0)
Monocytes Absolute: 0.6 10*3/uL (ref 0.2–0.9)
Monocytes Relative: 8 %
Neutro Abs: 5 10*3/uL (ref 1.4–6.5)
Neutrophils Relative %: 71 %
Platelets: 241 10*3/uL (ref 150–440)
RBC: 3.78 MIL/uL — ABNORMAL LOW (ref 3.80–5.20)
RDW: 13.3 % (ref 11.5–14.5)
WBC: 7 10*3/uL (ref 3.6–11.0)

## 2017-11-17 LAB — MAGNESIUM: Magnesium: 1.8 mg/dL (ref 1.7–2.4)

## 2017-11-17 MED ORDER — PALONOSETRON HCL INJECTION 0.25 MG/5ML
0.2500 mg | Freq: Once | INTRAVENOUS | Status: AC
  Administered 2017-11-17: 0.25 mg via INTRAVENOUS
  Filled 2017-11-17: qty 5

## 2017-11-17 MED ORDER — SODIUM CHLORIDE 0.9 % IV SOLN
Freq: Once | INTRAVENOUS | Status: AC
  Administered 2017-11-17: 11:00:00 via INTRAVENOUS
  Filled 2017-11-17: qty 250

## 2017-11-17 MED ORDER — SODIUM CHLORIDE 0.9 % IV SOLN
Freq: Once | INTRAVENOUS | Status: AC
  Administered 2017-11-17: 11:00:00 via INTRAVENOUS
  Filled 2017-11-17: qty 5

## 2017-11-17 MED ORDER — DOXORUBICIN HCL CHEMO IV INJECTION 2 MG/ML
60.0000 mg/m2 | Freq: Once | INTRAVENOUS | Status: AC
  Administered 2017-11-17: 110 mg via INTRAVENOUS
  Filled 2017-11-17: qty 50

## 2017-11-17 MED ORDER — CLOTRIMAZOLE 10 MG MT LOZG: 10.0000 mg | LOZENGE | Freq: Four times a day (QID) | OROMUCOSAL | 1 refills | Status: DC

## 2017-11-17 MED ORDER — SODIUM CHLORIDE 0.9 % IV SOLN
600.0000 mg/m2 | Freq: Once | INTRAVENOUS | Status: AC
  Administered 2017-11-17: 1100 mg via INTRAVENOUS
  Filled 2017-11-17: qty 50

## 2017-11-17 MED ORDER — HEPARIN SOD (PORK) LOCK FLUSH 100 UNIT/ML IV SOLN
500.0000 [IU] | Freq: Once | INTRAVENOUS | Status: AC | PRN
  Administered 2017-11-17: 500 [IU]
  Filled 2017-11-17: qty 5

## 2017-11-17 MED ORDER — SODIUM CHLORIDE 0.9% FLUSH
10.0000 mL | INTRAVENOUS | Status: DC | PRN
  Filled 2017-11-17: qty 10

## 2017-11-17 MED ORDER — POTASSIUM CHLORIDE CRYS ER 20 MEQ PO TBCR: EXTENDED_RELEASE_TABLET | ORAL | 0 refills | Status: DC

## 2017-11-17 NOTE — Progress Notes (Signed)
Patient offers no complaints today. 

## 2017-11-17 NOTE — Progress Notes (Signed)
[facility] day:  11/17/2017  Chief Complaint: Mariah Hogan is a 67 y.o. female with clinical stage IA left breast cancer who is seen for assessment prior to cycle #2 neoadjuvant AC.  HPI:  The patient was last seen in the medical oncology clinic on 11/05/2017.  At that time, she was seen for nadir visit following cycle #1 AC.  She had lost 7 pounds due to decreased appetite and diarrhea.  She was on fluconazole for thrush.  She had Neulasta induced back pain.  CBC revealed a hematocrit of 37.7, hemoglobin 12.8, MCV 94, platelets 128,000, WBC 4500 with an ANC of 2900.  Stool studies were requested for C.diff and GI panel, but were not returned.  During the interim, patient is doing well overall. She notes that her diarrhea and thrush has resolved. Bone pain improved with loratadine. She notes that she did not require the prescribed Norco. She states, "after you called the pain medicine in, I started to feel better and decided to hold off". Patient denies that she has experienced any B symptoms. She denies any interval infections.   Patient advises that she maintains an adequate appetite. She is eating well. Weight today is 161 lb 8 oz (73.3 kg), which compared to her last visit to the clinic, represents a 4 pound increase.   Patient denies pain in the clinic today.   Past Medical History:  Diagnosis Date  . Anemia   . Arthritis   . Asthma    H/O YEARS AGO-NOW HAS COUGHING SPELLS WHICH IS WHY SHE HAS HER ALBUTEROL INHALER  . Breast cancer ([place] [facility]) 08/04/2017   left breast INVASIVE DUCTAL CARCINOMA.  . Cancer ([place])    [phone] basal cell carcinoma  . Complication of anesthesia    HARD TIME WAKING UP  . Dysrhythmia    TACHYCARDIA  . GERD (gastroesophageal reflux disease)   . History of hiatal hernia    SMALL  . Hyperlipidemia   . Hypertension   . PONV (postoperative nausea and vomiting)     Past Surgical History:  Procedure  Laterality Date  . ABDOMINAL HYSTERECTOMY  1998  . bladder tack  [id number]  . BREAST BIOPSY Bilateral   . BREAST BIOPSY Left 1990  . BREAST BIOPSY Left 2018   core bx- neg  . 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  Marland Kitchen BREAST CYST EXCISION  x3  . BREAST SURGERY Left 02/13/14   Fibrocystic changes, pseudo-angiomatous stromal hyperplasia. No atypia or malignancy.  . CARPAL TUNNEL RELEASE  x2  . CHOLECYSTECTOMY  2008  . COLONOSCOPY WITH PROPOFOL N/A 01/12/2015   Procedure: COLONOSCOPY WITH PROPOFOL;  Surgeon: Manya Silvas, MD;  Location[facility] ENDOSCOPY;  Service: Endoscopy;  Laterality: N/A;  . ESOPHAGOGASTRODUODENOSCOPY (EGD) WITH PROPOFOL N/A 01/12/2015   Procedure: ESOPHAGOGASTRODUODENOSCOPY (EGD) WITH PROPOFOL;  Surgeon: Manya Silvas, MD;  Location: [facility] ENDOSCOPY;  Service: Endoscopy;  Laterality: N/A;  . NASAL RECONSTRUCTION  1983  . PORTACATH PLACEMENT N/A 10/21/2017   Procedure: INSERTION PORT-A-CATH;  Surgeon: Robert Bellow, MD;  Location: [facility];  Service: General;  Laterality: N/A;  . SAVORY DILATION N/A 01/12/2015   Procedure: Azzie Almas DILATION;  Surgeon: Manya Silvas, MD;  Location: [facility] ENDOSCOPY;  Service: Endoscopy;  Laterality: N/A;  . TONSILLECTOMY AND ADENOIDECTOMY  1956    Family History  Problem Relation Age of Onset  . Breast cancer Sister 72  currently 64  . Breast cancer Paternal Grandmother 44       bilateral; deceased 81s   . Breast cancer Cousin 97       daughter of an unaffected maternal aunt  . Breast cancer Cousin 54       kidney cancer also; daughter of an unaffected maternal aunt  . Other Mother        TAH/BSO prior to menopause  . Prostate cancer Paternal Grandfather        age at dx unknown  . Breast cancer Maternal Aunt        age at dx unknown  . Breast cancer Cousin 47       daughter of an unaffected maternal aunt    Social History:  reports that she has never smoked. She has never  used smokeless tobacco. She reports that she does not drink alcohol or use drugs.  Her daughter, Mariah Hogan, is a gastroenterology PA. She is a retired Customer service manager for 35 years. Patient denies known exposures to radiation on toxins. She is alone today.  Allergies:  Allergies  Allergen Reactions  . Penicillin G Shortness Of Breath, Swelling and Rash    Has patient had a PCN reaction causing immediate rash, facial/tongue/throat swelling, SOB or lightheadedness with hypotension: Yes Has patient had a PCN reaction causing severe rash involving mucus membranes or skin necrosis: No Has patient had a PCN reaction that required hospitalization: No Has patient had a PCN reaction occurring within the last 10 years: No If all of the above answers are "NO", then may proceed with Cephalosporin use.  [name] [Docetaxel] Shortness Of Breath and Palpitations    Back pain Back Spasms  . Other Other (See Comments)    Dodecyl gallate: Positive patch test  Elta MD UV Clear SPF 46: Positive patch test  fragrance dodocylgallate dodocylgallate  . Parabens Other (See Comments)    Positive patch test   . Shellac   . Fluocinonide Rash  . Latex Rash  . Tape Dermatitis  . Triamcinolone Rash    Current Medications: Current Outpatient Medications  Medication Sig Dispense Refill  . atenolol (TENORMIN) 50 MG tablet Take 50 mg by mouth every morning.     . beclomethasone (QVAR) 40 MCG/ACT inhaler Inhale into the lungs.    . calcium carbonate (TUMS - DOSED IN MG ELEMENTAL CALCIUM) 500 MG chewable tablet Chew 1 tablet by mouth daily as needed for indigestion or heartburn.    . Calcium Carbonate-Vitamin D (CALCIUM 600+D PO) Take 1 tablet by mouth daily.    . Cholecalciferol (VITAMIN D3) 5000 units CAPS Take 5,000 Units by mouth daily.    . citalopram (CELEXA) 20 MG tablet Take 20 mg by mouth at bedtime.     . Desoximetasone (TOPICORT) 0.25 % ointment Apply topically.    . fluconazole (DIFLUCAN) 100 MG tablet  Take 2 tablets (200 mg) on day 1 then take 1 tablet (100 mg) on days 2 - 5 6 tablet 0  . fluticasone (FLONASE) 50 MCG/ACT nasal spray Place 1 spray into both nostrils daily as needed for allergies or rhinitis.    Marland Kitchen HYDROcodone-acetaminophen (NORCO/VICODIN) 5-325 MG tablet Take 1 tablet by mouth every 6 (six) hours as needed for moderate pain. 10 tablet 0  . lidocaine-prilocaine (EMLA) cream Apply to affected area once 30 g 3  . loratadine (CLARITIN) 10 MG tablet Take 10 mg by mouth daily.    . magic mouthwash SOLN Take 5 mLs by mouth.    Marland Kitchen  Multiple Vitamin (MULTI-VITAMINS) TABS Take by mouth.    . ondansetron (ZOFRAN) 8 MG tablet Take 1 tablet (8 mg total) by mouth 2 (two) times daily as needed. Start on the third day after chemotherapy. 30 tablet 1  . pantoprazole (PROTONIX) 40 MG tablet Take 40 mg by mouth 2 (two) times daily.     . valACYclovir (VALTREX) 1000 MG tablet Take 1,000 mg by mouth daily. Take 1000 mg in the morning , may take a second 1000 mg dose as needed for fever blisters     . valsartan (DIOVAN) 80 MG tablet Take 80 mg by mouth every morning.     Marland Kitchen albuterol (PROVENTIL HFA;VENTOLIN HFA) 108 (90 Base) MCG/ACT inhaler Inhale 2 puffs into the lungs every 6 (six) hours as needed for wheezing or shortness of breath. (Patient not taking: Reported on 10/27/2017) 1 Inhaler 1   No current facility-administered medications for this visit.     Review of Systems  Constitutional: Positive for malaise/fatigue. Negative for diaphoresis, fever and weight loss (weight up 4 pounds).       Feels "ok".  HENT: Negative.  Negative for congestion, ear discharge, ear pain, nosebleeds, sinus pain, sore throat and tinnitus.        Thrush resolved.  Eyes: Negative.   Respiratory: Positive for cough (chronic) and shortness of breath (exertional). Negative for hemoptysis and sputum production.   Cardiovascular: Positive for palpitations (intermittent). Negative for chest pain, orthopnea, leg swelling and  PND.  Gastrointestinal: Positive for nausea (controlled). Negative for abdominal pain, blood in stool, constipation, diarrhea (resolved), melena and vomiting.  Genitourinary: Negative for dysuria, frequency, hematuria and urgency.  Musculoskeletal: Positive for joint pain (hands and feet). Negative for back pain, falls and myalgias.  Skin: Negative for itching and rash.  Neurological: Negative for dizziness, tingling, tremors, sensory change, speech change, focal weakness, weakness and headaches.  Endo/Heme/Allergies: Does not bruise/bleed easily.  Psychiatric/Behavioral: Negative for depression and memory loss. The patient is not nervous/anxious and does not have insomnia (HYPERsomnia).   All other systems reviewed and are negative.  Performance status (ECOG): 1 - Symptomatic but completely ambulatory  Vital Signs BP (!) 162/87 (BP Location: Left Arm, Patient Position: Sitting)   Pulse 74   Temp 98.3 F (36.8 C) (Tympanic)   Resp 18   Wt 161 lb 8 oz (73.3 kg)   BMI 26.47 kg/m   Physical Exam  Constitutional: She is oriented to person, place, and time and well-developed, well-nourished, and in no distress.  HENT:  Head: Normocephalic and atraumatic.  Shoulder length brown wig.  No thrush.  Eyes: Pupils are equal, round, and reactive to light. EOM are normal. No scleral icterus.  Blue eyes.  Neck: Normal range of motion. Neck supple. No JVD present.  Cardiovascular: Normal rate and normal heart sounds. Exam reveals no gallop and no friction rub.  No murmur heard. Pulmonary/Chest: Effort normal and breath sounds normal. No respiratory distress. She has no wheezes. She has no rales.  Abdominal: Soft. Bowel sounds are normal. She exhibits no distension. There is no tenderness.  Musculoskeletal: Normal range of motion. She exhibits no edema or tenderness.  Lymphadenopathy:    She has no cervical adenopathy.    She has no axillary adenopathy.       Right: No inguinal and no  supraclavicular adenopathy present.       Left: No inguinal and no supraclavicular adenopathy present.  Neurological: She is alert and oriented to person, place, and time.  Skin:  Skin is warm. No rash noted. No erythema.  Psychiatric: Mood, affect and judgment normal.  Nursing note and vitals reviewed.   Infusion on 11/17/2017  Component Date Value Ref Range Status  . Sodium 11/17/2017 138  135 - 145 mmol/L Final  . Potassium 11/17/2017 3.4* 3.5 - 5.1 mmol/L Final  . Chloride 11/17/2017 104  98 - 111 mmol/L Final  . CO2 11/17/2017 26  22 - 32 mmol/L Final  . Glucose, Bld 11/17/2017 119* 70 - 99 mg/dL Final  . BUN 11/17/2017 15  8 - 23 mg/dL Final  . Creatinine, Ser 11/17/2017 0.97  0.44 - 1.00 mg/dL Final  . Calcium 11/17/2017 9.1  8.9 - 10.3 mg/dL Final  . Total Protein 11/17/2017 6.3* 6.5 - 8.1 g/dL Final  . Albumin 11/17/2017 3.9  3.5 - 5.0 g/dL Final  . AST 11/17/2017 26  15 - 41 U/L Final  . ALT 11/17/2017 18  0 - 44 U/L Final  . Alkaline Phosphatase 11/17/2017 72  38 - 126 U/L Final  . Total Bilirubin 11/17/2017 0.7  0.3 - 1.2 mg/dL Final  . GFR calc non Af Amer 11/17/2017 58* >60 mL/min Final  . GFR calc Af Amer 11/17/2017 >60  >60 mL/min Final   Comment: (NOTE) The eGFR has been calculated using the CKD EPI equation. This calculation has not been validated in all clinical situations. eGFR's persistently <60 mL/min signify possible Chronic Kidney Disease.   Georgiann Hahn gap 11/17/2017 8  5 - 15 Final   Performed at [facility], [address] [place], [place] [address]  . WBC 11/17/2017 7.0  3.6 - 11.0 K/uL Final  . RBC 11/17/2017 3.78* 3.80 - 5.20 MIL/uL Final  . Hemoglobin 11/17/2017 12.0  12.0 - 16.0 g/dL Final  . HCT 11/17/2017 35.2  35.0 - 47.0 % Final  . MCV 11/17/2017 93.0  80.0 - 100.0 fL Final  . MCH 11/17/2017 31.6  26.0 - 34.0 pg Final  . MCHC 11/17/2017 34.0  32.0 - 36.0 g/dL Final  . RDW 11/17/2017 13.3  11.5 - 14.5 % Final  . Platelets 11/17/2017 241   150 - 440 K/uL Final  . Neutrophils Relative % 11/17/2017 71  % Final  . Neutro Abs 11/17/2017 5.0  1.4 - 6.5 K/uL Final  . Lymphocytes Relative 11/17/2017 19  % Final  . Lymphs Abs 11/17/2017 1.3  1.0 - 3.6 K/uL Final  . Monocytes Relative 11/17/2017 8  % Final  . Monocytes Absolute 11/17/2017 0.6  0.2 - 0.9 K/uL Final  . Eosinophils Relative 11/17/2017 1  % Final  . Eosinophils Absolute 11/17/2017 0.0  0 - 0.7 K/uL Final  . Basophils Relative 11/17/2017 1  % Final  . Basophils Absolute 11/17/2017 0.0  0 - 0.1 K/uL Final   Performed at [facility], [address] [place], [place] [address]  . Magnesium 11/17/2017 1.8  1.7 - 2.4 mg/dL Final   Performed at [facility], [address] [place], [place] [address]    Assessment:  JANDY BRACKENS is a 67 y.o. female with clinical stage IA left breast cancer s/p biopsy on 08/04/2017.  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  Tumor was ER + (95%), PR+ (20%), and Her 2 neu - by FISH.  Ki67 was 80%.  CA27.29 was 17.3 on 08/06/2017.  MammaPrint was high risk luminal-type B with a 10 year risk of recurrence untreated of 29% and 94.6% of distant metastasis free  interval with chemotherapy + hormonal therapy.  Left breast ultrasound on 08/04/2017 revealed a 1.65 x 1.69 x 1.86 cm multi-lobulated hypoechoic mass in the upper outer quadrant of the left breast in the 2 o'clock position 7 cm from the nipple.  Bilateral breast MRI on 08/20/2017 revealed a 2.4 cm enhancing irregular mass in the outer left breast.  There were several prominent lymph nodes with borderline thicked cortices between 3-4 mm, but normal morphology.  There was a 5 mm enhancing mass in the anterior, retroareolar region of the right breast  RIGHT breast retroareolar biopsy on 09/02/2017 revealed no evidence of malignancy.  There was fibrocystic change and usual ductal hyperplasia.  Chest CT on 09/23/2016 revealed a 4 mm RUL subpleural  nodule.  Chest CT on 08/17/2017 revealed a small 4 mm RUL nodule unchanged in size.  She received neoadjuvant Femara (08/12/2017 - 08/31/2017) secondary to planned a delay in surgery (mastectomy with immediate reconstruction).  Femara was discontinued after high risk Mammaprint returned.  She received cycle #1 neoadjuvant Taxotere and Cytoxan (09/03/2017). Cycle #2 on 10/01/2017 was truncated after approximately 15 minutes of Taxotere infusion due to a reaction (back pain, SOB, shaking).  She is s/p cycle #1 AC (10/27/2017) with Udencya support.  Cycle #1 was complicated by thrush and diarrhea.  Echo on 10/14/2017 revealed an EF of 55-60%,  Bone density on 08/17/2017 revealed osteopenia with a T-score of -1.5 in the left femoral neck and -0.7 in the AP spine L1-L4.  She has a significant family history of breast cancer (paternal grandmother, sister, and 3 maternal cousins).   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  She was admitted to [facility] from 09/11/2017 - 09/15/2017 with enteropathogenic E.Coli (EPEC) and associated acute renal failure (creatinine 1.87; CrCl 28.3 ml/min). She was treated with intravenous azithromycin and ceftriaxone.   Symptomatically, patient is doing well overall. Cough and shortness of breath persist, however noted to be stable. Pain and nausea are well controlled. Appetite is stable; weight up 4 pounds. Exam is unremarkable. WBC 7000 ([place] 5000). Potassium is 3.4. BUN 15 and creatinine 0.97 (CrCl 55.5 mL/min).   Plan: 1. Labs today:  CBC with diff, CMP, Mg. 2. LEFT breast cancer  Tolerating treatment well overall. Minimal side effects.   Labs reviewed. Blood counts stable and adequate enough for treatment. Will proceed with cycle #2 AC today.  Discuss symptom management.  Patient has antiemetics and pain medications at home to use on a PRN basis. Patient  advising that the  prescribed  interventions are adequate at this point. Continue all medications as previously prescribed.  3. HYPOkalemia  Potassium low today at 3.4. Will replace with oral KCl 20 mEq daily x 3 days.   Encouraged K+ rich food intake.  4. RTC tomorrow for [employer]. 5. RTC in 3 weeks for MD assessment, labs (CBC with diff, CMP, Mg), and cycle #3 AC with Udencya support.   Honor Loh, NP  11/17/2017, 10:14 AM   I saw and evaluated the patient, participating in the key portions of the service and reviewing pertinent diagnostic studies and records.  I reviewed the nurse practitioner's note and agree with the findings and the plan.  The assessment and plan were discussed with the patient.  Several questions were asked by the patient and answered.   Nolon Stalls, MD 11/17/2017,10:14 AM

## 2017-11-18 ENCOUNTER — Telehealth: Payer: Self-pay | Admitting: General Surgery

## 2017-11-18 ENCOUNTER — Inpatient Hospital Stay: Payer: Medicare Other

## 2017-11-18 DIAGNOSIS — C50912 Malignant neoplasm of unspecified site of left female breast: Secondary | ICD-10-CM

## 2017-11-18 DIAGNOSIS — Z5111 Encounter for antineoplastic chemotherapy: Secondary | ICD-10-CM | POA: Diagnosis not present

## 2017-11-18 MED ORDER — PEGFILGRASTIM-CBQV 6 MG/0.6ML ~~LOC~~ SOSY
6.0000 mg | PREFILLED_SYRINGE | Freq: Once | SUBCUTANEOUS | Status: AC
  Administered 2017-11-18: 6 mg via SUBCUTANEOUS

## 2017-11-18 NOTE — Telephone Encounter (Signed)
Patient called stating she saw Dr Mike Gip and will complete her last chemo 12-18-17. She would like to know if she needs to have a scan to determine if the area has shrunk and then an appointment or does she just need an appointment scheduled? Please advice.

## 2017-11-23 NOTE — Telephone Encounter (Signed)
I L/M ASKING PATIENT TO CALL & SCHEDULE AN APPOINTMENT WITH DR Bary Castilla AND A  left breast ultrasound/PER DR BYRNETT.

## 2017-11-23 NOTE — Telephone Encounter (Signed)
I have l/m for patient to call & schedule an appointment with Dr Bary Castilla an a plan for left breast ultrasound.

## 2017-11-24 ENCOUNTER — Telehealth: Payer: Self-pay | Admitting: *Deleted

## 2017-11-24 MED ORDER — FLUCONAZOLE 100 MG PO TABS: ORAL_TABLET | ORAL | 0 refills | Status: DC

## 2017-11-24 NOTE — Telephone Encounter (Signed)
Patient informed of prescription sent to pharmacy, she thanked me and I asked that she call back if no improvement

## 2017-11-24 NOTE — Telephone Encounter (Signed)
She has clotrimazole and Magic mouthwash. Lets do Diflucan 200 mg x 1 day, then 100 mg x 6 days (7 days of therapy).

## 2017-11-24 NOTE — Telephone Encounter (Signed)
Patient called and states that her mouth is white coated and dore from thrush, She is using the Mycelex troches, but they are not helping. She requests that some other medicine be called in for her to use.Please advise

## 2017-12-08 ENCOUNTER — Encounter: Payer: Self-pay | Admitting: Hematology and Oncology

## 2017-12-08 ENCOUNTER — Other Ambulatory Visit: Payer: Self-pay

## 2017-12-08 ENCOUNTER — Inpatient Hospital Stay (HOSPITAL_BASED_OUTPATIENT_CLINIC_OR_DEPARTMENT_OTHER): Payer: Medicare Other | Admitting: Hematology and Oncology

## 2017-12-08 ENCOUNTER — Inpatient Hospital Stay: Payer: Medicare Other | Attending: Hematology and Oncology

## 2017-12-08 ENCOUNTER — Inpatient Hospital Stay: Payer: Medicare Other

## 2017-12-08 VITALS — BP 148/74 | HR 90 | Temp 98.3°F | Wt 164.1 lb

## 2017-12-08 DIAGNOSIS — C50412 Malignant neoplasm of upper-outer quadrant of left female breast: Secondary | ICD-10-CM

## 2017-12-08 DIAGNOSIS — M8580 Other specified disorders of bone density and structure, unspecified site: Secondary | ICD-10-CM | POA: Insufficient documentation

## 2017-12-08 DIAGNOSIS — Z170 Estrogen receptor positive status [ER+]: Secondary | ICD-10-CM

## 2017-12-08 DIAGNOSIS — E876 Hypokalemia: Secondary | ICD-10-CM | POA: Insufficient documentation

## 2017-12-08 DIAGNOSIS — Z5111 Encounter for antineoplastic chemotherapy: Secondary | ICD-10-CM

## 2017-12-08 DIAGNOSIS — C50912 Malignant neoplasm of unspecified site of left female breast: Secondary | ICD-10-CM

## 2017-12-08 DIAGNOSIS — Z5189 Encounter for other specified aftercare: Secondary | ICD-10-CM | POA: Diagnosis not present

## 2017-12-08 LAB — COMPREHENSIVE METABOLIC PANEL
ALT: 15 U/L (ref 0–44)
AST: 26 U/L (ref 15–41)
Albumin: 3.6 g/dL (ref 3.5–5.0)
Alkaline Phosphatase: 74 U/L (ref 38–126)
Anion gap: 12 (ref 5–15)
BUN: 16 mg/dL (ref 8–23)
CO2: 23 mmol/L (ref 22–32)
Calcium: 8.9 mg/dL (ref 8.9–10.3)
Chloride: 103 mmol/L (ref 98–111)
Creatinine, Ser: 0.85 mg/dL (ref 0.44–1.00)
GFR calc Af Amer: >60 mL/min (ref >60)
GFR calc non Af Amer: >60 mL/min (ref >60)
Glucose, Bld: 194 mg/dL — ABNORMAL HIGH (ref 70–99)
Potassium: 3.3 mmol/L — ABNORMAL LOW (ref 3.5–5.1)
Sodium: 138 mmol/L (ref 135–145)
Total Bilirubin: 0.3 mg/dL (ref 0.3–1.2)
Total Protein: 5.9 g/dL — ABNORMAL LOW (ref 6.5–8.1)

## 2017-12-08 LAB — CBC WITH DIFFERENTIAL/PLATELET
Basophils Absolute: 0 10*3/uL (ref 0–0.1)
Basophils Relative: 1 %
Eosinophils Absolute: 0.1 10*3/uL (ref 0–0.7)
Eosinophils Relative: 2 %
HCT: 32.3 % — ABNORMAL LOW (ref 35.0–47.0)
Hemoglobin: 11.4 g/dL — ABNORMAL LOW (ref 12.0–16.0)
Lymphocytes Relative: 16 %
Lymphs Abs: 1 10*3/uL (ref 1.0–3.6)
MCH: 33.1 pg (ref 26.0–34.0)
MCHC: 35.2 g/dL (ref 32.0–36.0)
MCV: 94.1 fL (ref 80.0–100.0)
Monocytes Absolute: 0.5 10*3/uL (ref 0.2–0.9)
Monocytes Relative: 8 %
Neutro Abs: 4.3 10*3/uL (ref 1.4–6.5)
Neutrophils Relative %: 73 %
Platelets: 216 10*3/uL (ref 150–440)
RBC: 3.43 MIL/uL — ABNORMAL LOW (ref 3.80–5.20)
RDW: 14.7 % — ABNORMAL HIGH (ref 11.5–14.5)
WBC: 6 10*3/uL (ref 3.6–11.0)

## 2017-12-08 LAB — MAGNESIUM: Magnesium: 1.8 mg/dL (ref 1.7–2.4)

## 2017-12-08 MED ORDER — SODIUM CHLORIDE 0.9 % IV SOLN
Freq: Once | INTRAVENOUS | Status: AC
  Administered 2017-12-08: 12:00:00 via INTRAVENOUS
  Filled 2017-12-08: qty 5

## 2017-12-08 MED ORDER — DOXORUBICIN HCL CHEMO IV INJECTION 2 MG/ML
60.0000 mg/m2 | Freq: Once | INTRAVENOUS | Status: AC
  Administered 2017-12-08: 110 mg via INTRAVENOUS
  Filled 2017-12-08: qty 50

## 2017-12-08 MED ORDER — FLUCONAZOLE 100 MG PO TABS: ORAL_TABLET | ORAL | 0 refills | Status: DC

## 2017-12-08 MED ORDER — HEPARIN SOD (PORK) LOCK FLUSH 100 UNIT/ML IV SOLN
500.0000 [IU] | Freq: Once | INTRAVENOUS | Status: AC
  Administered 2017-12-08: 500 [IU] via INTRAVENOUS
  Filled 2017-12-08: qty 5

## 2017-12-08 MED ORDER — SODIUM CHLORIDE 0.9% FLUSH
10.0000 mL | Freq: Once | INTRAVENOUS | Status: AC
  Administered 2017-12-08: 10 mL via INTRAVENOUS
  Filled 2017-12-08: qty 10

## 2017-12-08 MED ORDER — SODIUM CHLORIDE 0.9 % IV SOLN
600.0000 mg/m2 | Freq: Once | INTRAVENOUS | Status: AC
  Administered 2017-12-08: 1100 mg via INTRAVENOUS
  Filled 2017-12-08: qty 50

## 2017-12-08 MED ORDER — PALONOSETRON HCL INJECTION 0.25 MG/5ML
0.2500 mg | Freq: Once | INTRAVENOUS | Status: AC
  Administered 2017-12-08: 0.25 mg via INTRAVENOUS
  Filled 2017-12-08: qty 5

## 2017-12-08 MED ORDER — SODIUM CHLORIDE 0.9 % IV SOLN
Freq: Once | INTRAVENOUS | Status: AC
  Administered 2017-12-08: 11:00:00 via INTRAVENOUS
  Filled 2017-12-08: qty 250

## 2017-12-08 NOTE — Progress Notes (Signed)
[facility] day:  12/08/2017  Chief Complaint: Mariah Hogan is a 67 y.o. female with clinical stage IA left breast cancer who is seen for assessment prior to cycle #3 neoadjuvant AC.  HPI:  The patient was last seen in the medical oncology clinic on 11/17/2017.  At that time, she was doing well overall. She continued to experience fatigue. Cough and shortness of breath persisted, but stable. Pain and nausea were well controlled. Appetite was stable; weight up 4 pounds. Exam was unremarkable. WBC was 7000 ([place] 5000). Potassium was 3.4. BUN 15 and creatinine 0.97 (CrCl 55.5 mL/min).   She received cycle #2 AC with Udencya support.  During the interim, patient has been doing well overall. He has varying degrees of fatigue. She has a chronic cough and exertional shortness of breath. Symptoms stable with no acute exacerbations.  Patient feels generally well. Patient denies that she has experienced any B symptoms. She denies any interval infections.   Patient advises that she maintains an adequate appetite. She is eating well. Weight today is 164 lb 1.6 oz (74.4 kg), which compared to her last visit to the clinic, represents a 3 pound increase.   Patient denies pain in the clinic today.   Past Medical History:  Diagnosis Date  . Anemia   . Arthritis   . Asthma    H/O YEARS AGO-NOW HAS COUGHING SPELLS WHICH IS WHY SHE HAS HER ALBUTEROL INHALER  . Breast cancer ([place]) 08/04/2017   left breast INVASIVE DUCTAL CARCINOMA.  . Cancer ([place])    [phone] basal cell carcinoma  . Complication of anesthesia    HARD TIME WAKING UP  . Dysrhythmia    TACHYCARDIA  . GERD (gastroesophageal reflux disease)   . History of hiatal hernia    SMALL  . Hyperlipidemia   . Hypertension   . PONV (postoperative nausea and vomiting)     Past Surgical History:  Procedure Laterality Date  . ABDOMINAL HYSTERECTOMY  1998  . bladder tack  [id number]  . BREAST  BIOPSY Bilateral   . BREAST BIOPSY Left 1990  . BREAST BIOPSY Left 2018   core bx- neg  . 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  Marland Kitchen BREAST CYST EXCISION  x3  . BREAST SURGERY Left 02/13/14   Fibrocystic changes, pseudo-angiomatous stromal hyperplasia. No atypia or malignancy.  . CARPAL TUNNEL RELEASE  x2  . CHOLECYSTECTOMY  2008  . COLONOSCOPY WITH PROPOFOL N/A 01/12/2015   Procedure: COLONOSCOPY WITH PROPOFOL;  Surgeon: Manya Silvas, MD;  Location: [facility] ENDOSCOPY;  Service: Endoscopy;  Laterality: N/A;  . ESOPHAGOGASTRODUODENOSCOPY (EGD) WITH PROPOFOL N/A 01/12/2015   Procedure: ESOPHAGOGASTRODUODENOSCOPY (EGD) WITH PROPOFOL;  Surgeon: Manya Silvas, MD;  Location: [facility] ENDOSCOPY;  Service: Endoscopy;  Laterality: N/A;  . NASAL RECONSTRUCTION  1983  . PORTACATH PLACEMENT N/A 10/21/2017   Procedure: INSERTION PORT-A-CATH;  Surgeon: Robert Bellow, MD;  Location: [facility];  Service: General;  Laterality: N/A;  . SAVORY DILATION N/A 01/12/2015   Procedure: Azzie Almas DILATION;  Surgeon: Manya Silvas, MD;  Location: [facility] ENDOSCOPY;  Service: Endoscopy;  Laterality: N/A;  . TONSILLECTOMY AND ADENOIDECTOMY  1956    Family History  Problem Relation Age of Onset  . Breast cancer Sister 31       currently 23  . Breast cancer Paternal Grandmother 59       bilateral; deceased 56s   . Breast cancer Cousin  29       daughter of an unaffected maternal aunt  . Breast cancer Cousin 44       kidney cancer also; daughter of an unaffected maternal aunt  . Other Mother        TAH/BSO prior to menopause  . Prostate cancer Paternal Grandfather        age at dx unknown  . Breast cancer Maternal Aunt        age at dx unknown  . Breast cancer Cousin 90       daughter of an unaffected maternal aunt    Social History:  reports that she has never smoked. She has never used smokeless tobacco. She reports that she does not drink alcohol or use drugs.  Her  daughter, [name], is a gastroenterology PA. She is a retired Customer service manager for 35 years. Patient denies known exposures to radiation on toxins. She is alone today.  Allergies:  Allergies  Allergen Reactions  . Penicillin G Shortness Of Breath, Swelling and Rash    Has patient had a PCN reaction causing immediate rash, facial/tongue/throat swelling, SOB or lightheadedness with hypotension: Yes Has patient had a PCN reaction causing severe rash involving mucus membranes or skin necrosis: No Has patient had a PCN reaction that required hospitalization: No Has patient had a PCN reaction occurring within the last 10 years: No If all of the above answers are "NO", then may proceed with Cephalosporin use.  [name] [Docetaxel] Shortness Of Breath and Palpitations    Back pain Back Spasms  . Other Other (See Comments)    Dodecyl gallate: Positive patch test  Elta MD UV Clear SPF 46: Positive patch test  fragrance dodocylgallate dodocylgallate  . Parabens Other (See Comments)    Positive patch test   . Shellac   . Fluocinonide Rash  . Latex Rash  . Tape Dermatitis  . Triamcinolone Rash    Current Medications: Current Outpatient Medications  Medication Sig Dispense Refill  . atenolol (TENORMIN) 50 MG tablet Take 50 mg by mouth every morning.     . calcium carbonate (TUMS - DOSED IN MG ELEMENTAL CALCIUM) 500 MG chewable tablet Chew 1 tablet by mouth daily as needed for indigestion or heartburn.    . Calcium Carbonate-Vitamin D (CALCIUM 600+D PO) Take 1 tablet by mouth daily.    . Cholecalciferol (VITAMIN D3) 5000 units CAPS Take 5,000 Units by mouth daily.    . citalopram (CELEXA) 20 MG tablet Take 20 mg by mouth at bedtime.     . clotrimazole (MYCELEX) 10 MG troche Take 1 lozenge (10 mg total) by mouth 4 (four) times daily. 70 lozenge 1  . Desoximetasone (TOPICORT) 0.25 % ointment Apply topically.    . fluticasone (FLONASE) 50 MCG/ACT nasal spray Place 1 spray into both nostrils daily  as needed for allergies or rhinitis.    Marland Kitchen lidocaine-prilocaine (EMLA) cream Apply to affected area once 30 g 3  . loratadine (CLARITIN) 10 MG tablet Take 10 mg by mouth daily.    . magic mouthwash SOLN Take 5 mLs by mouth.    . Multiple Vitamin (MULTI-VITAMINS) TABS Take by mouth.    . ondansetron (ZOFRAN) 8 MG tablet Take 1 tablet (8 mg total) by mouth 2 (two) times daily as needed. Start on the third day after chemotherapy. 30 tablet 1  . pantoprazole (PROTONIX) 40 MG tablet Take 40 mg by mouth 2 (two) times daily.     . potassium chloride SA (K-DUR,KLOR-CON) 20  MEQ tablet Take 1 tablet (20 mEq) daily x 3 days. Hold additional supply for use as directed by oncology. 9 tablet 0  . valACYclovir (VALTREX) 1000 MG tablet Take 1,000 mg by mouth daily. Take 1000 mg in the morning , may take a second 1000 mg dose as needed for fever blisters     . valsartan (DIOVAN) 80 MG tablet Take 80 mg by mouth every morning.     Marland Kitchen albuterol (PROVENTIL HFA;VENTOLIN HFA) 108 (90 Base) MCG/ACT inhaler Inhale 2 puffs into the lungs every 6 (six) hours as needed for wheezing or shortness of breath. (Patient not taking: Reported on 12/08/2017) 1 Inhaler 1  . beclomethasone (QVAR) 40 MCG/ACT inhaler Inhale into the lungs.    . fluconazole (DIFLUCAN) 100 MG tablet Take 2 tablets (200 mg) on day 1 then take 1 tablet (100 mg) on days 2 - 5 6 tablet 0  . HYDROcodone-acetaminophen (NORCO/VICODIN) 5-325 MG tablet Take 1 tablet by mouth every 6 (six) hours as needed for moderate pain. (Patient not taking: Reported on 12/08/2017) 10 tablet 0   No current facility-administered medications for this visit.     Review of Systems  Constitutional: Positive for malaise/fatigue. Negative for diaphoresis, fever and weight loss (up 3 pounds).  HENT: Negative.   Eyes: Negative.   Respiratory: Positive for cough (chronic) and shortness of breath (exertional). Negative for hemoptysis and sputum production.   Cardiovascular: Positive for  palpitations (intermittent). Negative for chest pain, orthopnea, leg swelling and PND.  Gastrointestinal: Positive for nausea (well controlled). Negative for abdominal pain, blood in stool, constipation, diarrhea, melena and vomiting.  Genitourinary: Negative for dysuria, frequency, hematuria and urgency.  Musculoskeletal: Positive for back pain and joint pain (hands and feet). Negative for falls and myalgias.  Skin: Negative for itching and rash.  Neurological: Negative for dizziness, tremors, weakness and headaches.  Endo/Heme/Allergies: Does not bruise/bleed easily.  Psychiatric/Behavioral: Negative for depression, memory loss and suicidal ideas. The patient is not nervous/anxious and does not have insomnia (hypersomnia).   All other systems reviewed and are negative.  Performance status (ECOG): 1 - Symptomatic but completely ambulatory  Vital Signs BP (!) 148/74 (Patient Position: Sitting)   Pulse 90   Temp 98.3 F (36.8 C) (Tympanic)   Wt 164 lb 1.6 oz (74.4 kg)   BMI 26.89 kg/m   Physical Exam  Constitutional: She is oriented to person, place, and time and well-developed, well-nourished, and in no distress.  HENT:  Head: Normocephalic and atraumatic. Hair is abnormal (chemotherapy induced alopecia; wearing a wig).  Eyes: Pupils are equal, round, and reactive to light. EOM are normal. No scleral icterus.  Blue eyes  Neck: Normal range of motion. Neck supple. No tracheal deviation present. No thyromegaly present.  Cardiovascular: Normal rate, regular rhythm and normal heart sounds. Exam reveals no gallop and no friction rub.  No murmur heard. Pulmonary/Chest: Effort normal and breath sounds normal. No respiratory distress. She has no wheezes. She has no rales.  Abdominal: Soft. Bowel sounds are normal. She exhibits no distension. There is no tenderness.  Musculoskeletal: Normal range of motion. She exhibits no edema or tenderness.  Lymphadenopathy:    She has no cervical  adenopathy.    She has no axillary adenopathy.       Right: No inguinal and no supraclavicular adenopathy present.       Left: No inguinal and no supraclavicular adenopathy present.  Neurological: She is alert and oriented to person, place, and time.  Skin: Skin  is warm and dry. No rash noted. No erythema.  Psychiatric: Mood, affect and judgment normal.  Nursing note and vitals reviewed.   Infusion on 12/08/2017  Component Date Value Ref Range Status  . Magnesium 12/08/2017 1.8  1.7 - 2.4 mg/dL Final   Performed at [facility], [address], [place], [place] [address]  . Sodium 12/08/2017 138  135 - 145 mmol/L Final  . Potassium 12/08/2017 3.3* 3.5 - 5.1 mmol/L Final  . Chloride 12/08/2017 103  98 - 111 mmol/L Final  . CO2 12/08/2017 23  22 - 32 mmol/L Final  . Glucose, Bld 12/08/2017 194* 70 - 99 mg/dL Final  . BUN 12/08/2017 16  8 - 23 mg/dL Final  . Creatinine, Ser 12/08/2017 0.85  0.44 - 1.00 mg/dL Final  . Calcium 12/08/2017 8.9  8.9 - 10.3 mg/dL Final  . Total Protein 12/08/2017 5.9* 6.5 - 8.1 g/dL Final  . Albumin 12/08/2017 3.6  3.5 - 5.0 g/dL Final  . AST 12/08/2017 26  15 - 41 U/L Final  . ALT 12/08/2017 15  0 - 44 U/L Final  . Alkaline Phosphatase 12/08/2017 74  38 - 126 U/L Final  . Total Bilirubin 12/08/2017 0.3  0.3 - 1.2 mg/dL Final  . GFR calc non Af Amer 12/08/2017 >60  >60 mL/min Final  . GFR calc Af Amer 12/08/2017 >60  >60 mL/min Final   Comment: (NOTE) The eGFR has been calculated using the CKD EPI equation. This calculation has not been validated in all clinical situations. eGFR's persistently <60 mL/min signify possible Chronic Kidney Disease.   Georgiann Hahn gap 12/08/2017 12  5 - 15 Final   Performed at [facility], [address] [place], [place] [address]  . WBC 12/08/2017 6.0  3.6 - 11.0 K/uL Final  . RBC 12/08/2017 3.43* 3.80 - 5.20 MIL/uL Final  . Hemoglobin 12/08/2017 11.4* 12.0 - 16.0 g/dL Final  . HCT 12/08/2017 32.3* 35.0 - 47.0  % Final  . MCV 12/08/2017 94.1  80.0 - 100.0 fL Final  . MCH 12/08/2017 33.1  26.0 - 34.0 pg Final  . MCHC 12/08/2017 35.2  32.0 - 36.0 g/dL Final  . RDW 12/08/2017 14.7* 11.5 - 14.5 % Final  . Platelets 12/08/2017 216  150 - 440 K/uL Final  . Neutrophils Relative % 12/08/2017 73  % Final  . Neutro Abs 12/08/2017 4.3  1.4 - 6.5 K/uL Final  . Lymphocytes Relative 12/08/2017 16  % Final  . Lymphs Abs 12/08/2017 1.0  1.0 - 3.6 K/uL Final  . Monocytes Relative 12/08/2017 8  % Final  . Monocytes Absolute 12/08/2017 0.5  0.2 - 0.9 K/uL Final  . Eosinophils Relative 12/08/2017 2  % Final  . Eosinophils Absolute 12/08/2017 0.1  0 - 0.7 K/uL Final  . Basophils Relative 12/08/2017 1  % Final  . Basophils Absolute 12/08/2017 0.0  0 - 0.1 K/uL Final   Performed at [facility], [address] [place], [place] [address]    Assessment:  Mariah Hogan is a 67 y.o. female with clinical stage IA left breast cancer s/p biopsy on 08/04/2017.  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  Tumor was ER + (95%), PR+ (20%), and Her 2 neu - by FISH.  Ki67 was 80%.  CA27.29 was 17.3 on 08/06/2017.  MammaPrint was high risk luminal-type B with a 10 year risk of recurrence untreated of 29% and 94.6% of distant metastasis free interval  with chemotherapy + hormonal therapy.  Left breast ultrasound on 08/04/2017 revealed a 1.65 x 1.69 x 1.86 cm multi-lobulated hypoechoic mass in the upper outer quadrant of the left breast in the 2 o'clock position 7 cm from the nipple.  Bilateral breast MRI on 08/20/2017 revealed a 2.4 cm enhancing irregular mass in the outer left breast.  There were several prominent lymph nodes with borderline thicked cortices between 3-4 mm, but normal morphology.  There was a 5 mm enhancing mass in the anterior, retroareolar region of the right breast  RIGHT breast retroareolar biopsy on 09/02/2017 revealed no evidence of malignancy.  There was fibrocystic  change and usual ductal hyperplasia.  Chest CT on 09/23/2016 revealed a 4 mm RUL subpleural nodule.  Chest CT on 08/17/2017 revealed a small 4 mm RUL nodule unchanged in size.  She received neoadjuvant Femara (08/12/2017 - 08/31/2017) secondary to planned a delay in surgery (mastectomy with immediate reconstruction).  Femara was discontinued after high risk Mammaprint returned.  She received cycle #1 neoadjuvant Taxotere and Cytoxan (09/03/2017). Cycle #2 on 10/01/2017 was truncated after approximately 15 minutes of Taxotere infusion due to a reaction (back pain, SOB, shaking).  She s/p cycle #2 AC (10/27/2017 - 11/17/2017) with Udencya support.  Echo on 10/14/2017 revealed an EF of 55-60%,  Bone density on 08/17/2017 revealed osteopenia with a T-score of -1.5 in the left femoral neck and -0.7 in the AP spine L1-L4.  She has a significant family history of breast cancer (paternal grandmother, sister, and 3 maternal cousins).   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  She was admitted to [facility] from 09/11/2017 - 09/15/2017 with enteropathogenic E.Coli (EPEC) and associated acute renal failure (creatinine 1.87; CrCl 28.3 ml/min). She was treated with intravenous azithromycin and ceftriaxone.   Symptomatically, patient is doing well overall. She remains fatigued. Cough and shortness of breath persist, but noted to be stable. Pain and nausea well controlled. Appetite stable; weight up 3 pounds.  Exam is unremarkable.  WBC 6000 ([place] 4300).  Potassium low at 3.3.  Blood glucose elevated at 194.  Plan: 1. Labs today:  CBC with diff, CMP, Mg. 2. LEFT breast cancer Doing well overall. Tolerating treatments with minimal side effects. Discuss plans for surgery.  Patient scheduled to follow-up with Dr. Bary Castilla on 12/29/2017 for repeat breast ultrasound. Labs reviewed. Blood counts stable and adequate enough for treatment.  Will proceed with cycle #3 neoadjuvant AC (last cycle of planned chemotherapy) with Udenyca support.  Discuss symptom management.  Patient has antiemetics and pain medications at home to use on a PRN basis. Patient  advising that the  prescribed interventions are adequate at this point. Continue all medications as previously prescribed.  3. HYPOkalemia  Potassium remains low at 3.3.  Patient to take oral KCl 40 mEq today, then 20 mEq x 3 days.  Will recheck BMP on 12/14/2017. 4. RTC 2 weeks after surgery - patient will need to call for an appointment.   Honor Loh, NP  12/08/2017, 11:42 AM   I saw and evaluated the patient, participating in the key portions of the service and reviewing pertinent diagnostic studies and records.  I reviewed the nurse practitioner's note and agree with the findings and the plan.  The assessment and plan were discussed with the patient.  Several questions were asked by the patient and answered.   Nolon Stalls, MD 12/08/2017,11:44 AM

## 2017-12-09 ENCOUNTER — Inpatient Hospital Stay: Payer: Medicare Other

## 2017-12-09 DIAGNOSIS — C50912 Malignant neoplasm of unspecified site of left female breast: Secondary | ICD-10-CM

## 2017-12-09 DIAGNOSIS — Z5111 Encounter for antineoplastic chemotherapy: Secondary | ICD-10-CM | POA: Diagnosis not present

## 2017-12-09 MED ORDER — PEGFILGRASTIM-CBQV 6 MG/0.6ML ~~LOC~~ SOSY
6.0000 mg | PREFILLED_SYRINGE | Freq: Once | SUBCUTANEOUS | Status: AC
  Administered 2017-12-09: 6 mg via SUBCUTANEOUS

## 2017-12-14 ENCOUNTER — Inpatient Hospital Stay: Payer: Medicare Other

## 2017-12-14 DIAGNOSIS — C50912 Malignant neoplasm of unspecified site of left female breast: Secondary | ICD-10-CM

## 2017-12-14 DIAGNOSIS — Z5111 Encounter for antineoplastic chemotherapy: Secondary | ICD-10-CM | POA: Diagnosis not present

## 2017-12-14 LAB — BASIC METABOLIC PANEL
Anion gap: 9 (ref 5–15)
BUN: 20 mg/dL (ref 8–23)
CO2: 26 mmol/L (ref 22–32)
Calcium: 8.5 mg/dL — ABNORMAL LOW (ref 8.9–10.3)
Chloride: 99 mmol/L (ref 98–111)
Creatinine, Ser: 0.86 mg/dL (ref 0.44–1.00)
GFR calc Af Amer: >60 mL/min (ref >60)
GFR calc non Af Amer: >60 mL/min (ref >60)
Glucose, Bld: 198 mg/dL — ABNORMAL HIGH (ref 70–99)
Potassium: 3.5 mmol/L (ref 3.5–5.1)
Sodium: 134 mmol/L — ABNORMAL LOW (ref 135–145)

## 2017-12-29 ENCOUNTER — Ambulatory Visit (INDEPENDENT_AMBULATORY_CARE_PROVIDER_SITE_OTHER): Payer: Medicare Other | Admitting: General Surgery

## 2017-12-29 ENCOUNTER — Encounter: Payer: Self-pay | Admitting: General Surgery

## 2017-12-29 ENCOUNTER — Ambulatory Visit: Payer: Self-pay

## 2017-12-29 VITALS — BP 134/80 | HR 74 | Resp 13 | Ht 67.0 in | Wt 166.0 lb

## 2017-12-29 DIAGNOSIS — C50912 Malignant neoplasm of unspecified site of left female breast: Secondary | ICD-10-CM

## 2017-12-29 NOTE — Progress Notes (Signed)
Patient ID: Mariah Hogan, female   DOB: 06-08-1950, 67 y.o.   MRN: [id number]  Chief Complaint  Patient presents with  . Follow-up    HPI Mariah Hogan is a 67 y.o. female here today for her follow up breast cancer . Patient finished chemo on 12/08/2017.  Daughter, [name] PA  is present at visit.  HPI  Past Medical History:  Diagnosis Date  . Anemia   . Arthritis   . Asthma    H/O YEARS AGO-NOW HAS COUGHING SPELLS WHICH IS WHY SHE HAS HER ALBUTEROL INHALER  . Breast cancer ([place]) 08/04/2017   left breast INVASIVE DUCTAL CARCINOMA.  . Cancer ([place])    [phone] basal cell carcinoma  . Complication of anesthesia    HARD TIME WAKING UP  . Dysrhythmia    TACHYCARDIA  . GERD (gastroesophageal reflux disease)   . History of hiatal hernia    SMALL  . Hyperlipidemia   . Hypertension   . PONV (postoperative nausea and vomiting)     Past Surgical History:  Procedure Laterality Date  . ABDOMINAL HYSTERECTOMY  1998  . bladder tack  6301,6010  . BREAST BIOPSY Bilateral   . BREAST BIOPSY Left 1990  . BREAST BIOPSY Left 2018   core bx- neg  . 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  Marland Kitchen BREAST CYST EXCISION  x3  . BREAST SURGERY Left 02/13/14   Fibrocystic changes, pseudo-angiomatous stromal hyperplasia. No atypia or malignancy.  . CARPAL TUNNEL RELEASE  x2  . CHOLECYSTECTOMY  2008  . COLONOSCOPY WITH PROPOFOL N/A 01/12/2015   Procedure: COLONOSCOPY WITH PROPOFOL;  Surgeon: Manya Silvas, MD;  Location: [facility] ENDOSCOPY;  Service: Endoscopy;  Laterality: N/A;  . ESOPHAGOGASTRODUODENOSCOPY (EGD) WITH PROPOFOL N/A 01/12/2015   Procedure: ESOPHAGOGASTRODUODENOSCOPY (EGD) WITH PROPOFOL;  Surgeon: Manya Silvas, MD;  Location: [facility] ENDOSCOPY;  Service: Endoscopy;  Laterality: N/A;  . NASAL RECONSTRUCTION  1983  . PORTACATH PLACEMENT N/A 10/21/2017   Procedure: INSERTION PORT-A-CATH;  Surgeon: Robert Bellow, MD;  Location: [facility]  ORS;  Service: General;  Laterality: N/A;  . SAVORY DILATION N/A 01/12/2015   Procedure: Azzie Almas DILATION;  Surgeon: Manya Silvas, MD;  Location: [facility] ENDOSCOPY;  Service: Endoscopy;  Laterality: N/A;  . TONSILLECTOMY AND ADENOIDECTOMY  1956    Family History  Problem Relation Age of Onset  . Breast cancer Sister 48       currently 31  . Breast cancer Paternal Grandmother 105       bilateral; deceased 55s   . Breast cancer Cousin 54       daughter of an unaffected maternal aunt  . Breast cancer Cousin 3       kidney cancer also; daughter of an unaffected maternal aunt  . Other Mother        TAH/BSO prior to menopause  . Prostate cancer Paternal Grandfather        age at dx unknown  . Breast cancer Maternal Aunt        age at dx unknown  . Breast cancer Cousin 44       daughter of an unaffected maternal aunt    Social History Social History   Tobacco Use  . Smoking status: Never Smoker  . Smokeless tobacco: Never Used  Substance Use Topics  . Alcohol use: No    Alcohol/week: 0.0 standard drinks  . Drug use: No    Allergies  Allergen Reactions  .  Penicillin G Shortness Of Breath, Swelling and Rash    Has patient had a PCN reaction causing immediate rash, facial/tongue/throat swelling, SOB or lightheadedness with hypotension: Yes Has patient had a PCN reaction causing severe rash involving mucus membranes or skin necrosis: No Has patient had a PCN reaction that required hospitalization: No Has patient had a PCN reaction occurring within the last 10 years: No If all of the above answers are "NO", then may proceed with Cephalosporin use.  [name] [Docetaxel] Shortness Of Breath and Palpitations    Back pain Back Spasms  . Other Other (See Comments)    Dodecyl gallate: Positive patch test  Elta MD UV Clear SPF 46: Positive patch test  fragrance dodocylgallate dodocylgallate  . Parabens Other (See Comments)    Positive patch test  Positive patch test   .  Shellac Other (See Comments)    Positive patch test   . Fluocinonide Rash  . Latex Rash  . Tape Dermatitis  . Triamcinolone Rash    Current Outpatient Medications  Medication Sig Dispense Refill  . albuterol (PROVENTIL HFA;VENTOLIN HFA) 108 (90 Base) MCG/ACT inhaler Inhale 2 puffs into the lungs every 6 (six) hours as needed for wheezing or shortness of breath. 1 Inhaler 1  . atenolol (TENORMIN) 50 MG tablet Take 50 mg by mouth every morning.     . beclomethasone (QVAR) 40 MCG/ACT inhaler Inhale into the lungs.    . Cholecalciferol (VITAMIN D3) 5000 units CAPS Take 5,000 Units by mouth daily.    . citalopram (CELEXA) 20 MG tablet Take 20 mg by mouth at bedtime.     . clotrimazole (MYCELEX) 10 MG troche Take 1 lozenge (10 mg total) by mouth 4 (four) times daily. 70 lozenge 1  . fluconazole (DIFLUCAN) 100 MG tablet Take 2 tablets (200 mg) on day 1 then take 1 tablet (100 mg) on days 2 - 5 6 tablet 0  . fluticasone (FLONASE) 50 MCG/ACT nasal spray Place 1 spray into both nostrils daily as needed for allergies or rhinitis.    Marland Kitchen lidocaine-prilocaine (EMLA) cream Apply to affected area once 30 g 3  . loratadine (CLARITIN) 10 MG tablet Take 10 mg by mouth daily.    . magic mouthwash SOLN Take 5 mLs by mouth.    . ondansetron (ZOFRAN) 8 MG tablet Take 1 tablet (8 mg total) by mouth 2 (two) times daily as needed. Start on the third day after chemotherapy. 30 tablet 1  . pantoprazole (PROTONIX) 40 MG tablet Take 40 mg by mouth 2 (two) times daily.     . potassium chloride SA (K-DUR,KLOR-CON) 20 MEQ tablet Take 1 tablet (20 mEq) daily x 3 days. Hold additional supply for use as directed by oncology. 9 tablet 0  . valACYclovir (VALTREX) 1000 MG tablet Take 1,000 mg by mouth daily. Take 1000 mg in the morning , may take a second 1000 mg dose as needed for fever blisters     . valsartan (DIOVAN) 80 MG tablet Take 80 mg by mouth every morning.      No current facility-administered medications for this  visit.     Review of Systems Review of Systems  Constitutional: Negative.   Respiratory: Negative.   Cardiovascular: Negative.     Blood pressure 134/80, pulse 74, resp. rate 13, height 5' 7"  (1.702 m), weight 166 lb (75.3 kg).  Physical Exam Physical Exam  Constitutional: She is oriented to person, place, and time. She appears well-developed and well-nourished.  Eyes: Conjunctivae  are normal. No scleral icterus.  Neck: Normal range of motion.  Cardiovascular: Normal rate, regular rhythm and normal heart sounds.  Pulmonary/Chest: Effort normal and breath sounds normal.    Lymphadenopathy:    She has no cervical adenopathy.    She has no axillary adenopathy.       Left: No supraclavicular adenopathy present.  Neurological: She is alert and oriented to person, place, and time.  Skin: Skin is warm and dry.    Data Reviewed Ultrasound examination was undertaken to determine response to neoadjuvant chemotherapy.  The dominant mass in the 2 o'clock position of the left breast, 6 cm from the nipple shows a significant decrease in size with maximum diameter now of 0.67 x 0.74 x 0.98.  Examination of the axilla showed one node below the level of the subclavian vein measuring 0.56 x 0.84 cm.  Normal cortical thickening.  BI-RADS-6. The left upper outer quadrant lesion previously measured 1.65 x 1.69 x 1.86 cm.  Today's exam suggests a near 90% reduction in volume.  Assessment    Good response to neoadjuvant chemotherapy.    Plan  The patient and her daughter, [name], [place], had a long discussion regarding options for management.  She is felt to be best served by mastectomy, and she desires immediate reconstruction.  She had previously been evaluated by Dr. Audelia Hives, and is felt to be a candidate for immediate implant reconstruction.  In the past she had expressed an interest in bilateral reconstruction and mastectomy.  This had been discouraged in the past and once again  was discouraged.  At this time, she is comfortable with unilateral surgery.  Arrangements will be made for preop appointment with the plastic surgery service and then proceed with scheduling.    She has now out 3 weeks from her last treatment and surgery can be scheduled at any time.  Patient to be scheduled for  left mastectomy and reconstruction.   HPI, Physical Exam, Assessment and Plan have been scribed under the direction and in the presence of Hervey Ard, MD.  Gaspar Cola, CMA  I have completed the exam and reviewed the above documentation for accuracy and completeness.  I agree with the above.  Haematologist has been used and any errors in dictation or transcription are unintentional.  Hervey Ard, M.D., F.A.C.S.  Forest Gleason Adilynn Bessey 12/30/2017, 1:10 PM

## 2017-12-29 NOTE — Patient Instructions (Signed)
The patient is scheduled to see Dr Marla Roe on 01/07/18 at 10:00 am for a pre surgery visit. They will contact out office to scheduled the surgery with Dr Bary Castilla and Dr Marla Roe once she is seen.

## 2018-01-04 ENCOUNTER — Other Ambulatory Visit: Payer: Self-pay | Admitting: *Deleted

## 2018-01-04 NOTE — Progress Notes (Signed)
error 

## 2018-01-06 ENCOUNTER — Encounter: Payer: Self-pay | Admitting: *Deleted

## 2018-01-06 NOTE — Progress Notes (Signed)
Patient's surgery has been scheduled for 01-27-18 at [facility] with [place]. Surgery in coordination with Dr. Marla Roe who will be doing immediate breast reconstruction. ([place] at Dr. Eusebio Friendly office is aware of surgery date and will be contacting Leah in the O.R. Regarding their portion of the surgery).  The patient has been notified of arrival time day of surgery.   She is aware the Pre-admission Testing Department will be calling on 01-18-18 in the morning to complete phone interview.   Patient states she currently has a prescription for EMLA cream that was given to her from the [facility]. The patient has been instructed to apply EMLA cream to the areola one hour prior to arrival day of surgery. Patient to apply saran wrap over skin after application. She verbalizes understanding.   The patient reports the last time she had blood work done at the [facility] before chemo treatment her hemoglobin and potassium had been low. She states she is not scheduled to have any more blood work drawn at the [facility] prior to surgery. The patient was encouraged to eat potassium rich foods and aware this will most likely be rechecked by Pre-admission prior to surgery.   The patient is aware to call the office should they have further questions.

## 2018-01-07 ENCOUNTER — Encounter: Payer: Self-pay | Admitting: Plastic Surgery

## 2018-01-07 ENCOUNTER — Ambulatory Visit (INDEPENDENT_AMBULATORY_CARE_PROVIDER_SITE_OTHER): Payer: Self-pay | Admitting: Plastic Surgery

## 2018-01-07 VITALS — BP 152/90 | HR 94

## 2018-01-07 DIAGNOSIS — N6321 Unspecified lump in the left breast, upper outer quadrant: Secondary | ICD-10-CM

## 2018-01-07 DIAGNOSIS — C50912 Malignant neoplasm of unspecified site of left female breast: Secondary | ICD-10-CM

## 2018-01-07 NOTE — Progress Notes (Addendum)
Patient ID: Mariah Hogan, female    DOB: 1950-09-28, 67 y.o.   MRN: [id number]   Chief Complaint  Patient presents with  . Breast Problem    The patient is a 67 year old female here for her history and physical for left breast reconstruction.  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  She is now planning on having a left mastectomy with immediate reconstruction with expander placement and flex HD.  She inquired about keeping her nipple areola.  This is risky due to her previous surgeries and with her ptosis.  She is aware that it will most likely not be saved.  She is 5 feet 6 inches tall, weight is 160 pounds.  Preop bra is 36 C.  She has not had any recent illnesses and is otherwise doing well.   Review of Systems  Constitutional: Negative for activity change.  HENT: Negative.   Eyes: Negative.   Respiratory: Negative.   Cardiovascular: Negative.   Gastrointestinal: Negative.   Endocrine: Negative.   Genitourinary: Negative.   Musculoskeletal: Negative.  Negative for gait problem.  Skin: Negative.  Negative for color change.  Neurological: Negative.   Psychiatric/Behavioral: Negative.     Past Medical History:  Diagnosis Date  . Anemia   . Arthritis   . Asthma    H/O YEARS AGO-NOW HAS COUGHING SPELLS WHICH IS WHY SHE HAS HER ALBUTEROL INHALER  . Breast cancer ([place]) 08/04/2017   left breast INVASIVE DUCTAL CARCINOMA.  . Cancer ([place])    [phone] basal cell carcinoma  . Complication of anesthesia    HARD TIME WAKING UP  . Dysrhythmia    TACHYCARDIA  . GERD (gastroesophageal reflux disease)   . History of hiatal hernia    SMALL  . Hyperlipidemia   . Hypertension   . PONV (postoperative nausea and vomiting)     Past Surgical History:  Procedure Laterality Date  . ABDOMINAL HYSTERECTOMY  1998  . bladder tack  7096,[id number]  . BREAST BIOPSY Bilateral     . BREAST BIOPSY Left 1990  . BREAST BIOPSY Left 2018   core bx- neg  . 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  Marland Kitchen BREAST CYST EXCISION  x3  . BREAST SURGERY Left 02/13/14   Fibrocystic changes, pseudo-angiomatous stromal hyperplasia. No atypia or malignancy.  . CARPAL TUNNEL RELEASE  x2  . CHOLECYSTECTOMY  2008  . COLONOSCOPY WITH PROPOFOL N/A 01/12/2015   Procedure: COLONOSCOPY WITH PROPOFOL;  Surgeon: Manya Silvas, MD;  Location: [facility] ENDOSCOPY;  Service: Endoscopy;  Laterality: N/A;  . ESOPHAGOGASTRODUODENOSCOPY (EGD) WITH PROPOFOL N/A 01/12/2015   Procedure: ESOPHAGOGASTRODUODENOSCOPY (EGD) WITH PROPOFOL;  Surgeon: Manya Silvas, MD;  Location: [facility] ENDOSCOPY;  Service: Endoscopy;  Laterality: N/A;  . NASAL RECONSTRUCTION  1983  . PORTACATH PLACEMENT N/A 10/21/2017   Procedure: INSERTION PORT-A-CATH;  Surgeon: Robert Bellow, MD;  Location: [facility] ORS;  Service: General;  Laterality: N/A;  . SAVORY DILATION N/A 01/12/2015   Procedure: Azzie Almas DILATION;  Surgeon: Manya Silvas, MD;  Location: [facility] ENDOSCOPY;  Service: Endoscopy;  Laterality: N/A;  . TONSILLECTOMY AND ADENOIDECTOMY  1956      Current Outpatient Medications:  .  albuterol (PROVENTIL HFA;VENTOLIN HFA) 108 (90 Base) MCG/ACT inhaler, Inhale 2 puffs into the lungs every 6 (six) hours as needed for wheezing or shortness of breath., Disp: 1 Inhaler,  Rfl: 1 .  atenolol (TENORMIN) 50 MG tablet, Take 50 mg by mouth every morning. , Disp: , Rfl:  .  cetirizine (ZYRTEC) 10 MG tablet, Take 10 mg by mouth daily before lunch. , Disp: , Rfl:  .  citalopram (CELEXA) 20 MG tablet, Take 20 mg by mouth at bedtime. , Disp: , Rfl:  .  lidocaine-prilocaine (EMLA) cream, Apply to affected area once, Disp: 30 g, Rfl: 3 .  ondansetron (ZOFRAN) 8 MG tablet, Take 1 tablet (8 mg total) by mouth 2 (two) times daily as needed. Start on the third day after chemotherapy. (Patient not taking: Reported on  01/12/2018), Disp: 30 tablet, Rfl: 1 .  pantoprazole (PROTONIX) 40 MG tablet, Take 40 mg by mouth 2 (two) times daily. , Disp: , Rfl:  .  valACYclovir (VALTREX) 1000 MG tablet, Take 1,000 mg by mouth See admin instructions. Take 1000 mg in the morning , may take a second 1000 mg dose as needed for fever blisters , Disp: , Rfl:  .  valsartan (DIOVAN) 80 MG tablet, Take 80 mg by mouth every morning. , Disp: , Rfl:    Objective:   Vitals:   01/07/18 1010  BP: (!) 152/90  Pulse: 94    Physical Exam  Constitutional: She appears well-developed and well-nourished.  HENT:  Head: Normocephalic and atraumatic.  Eyes: Pupils are equal, round, and reactive to light. EOM are normal.  Neck: Normal range of motion.  Cardiovascular: Normal rate.  Pulmonary/Chest: Effort normal.  Abdominal: Soft.  Musculoskeletal: Normal range of motion.  Neurological: She is alert.  Skin: Skin is warm.  Psychiatric: She has a normal mood and affect. Her behavior is normal. Judgment and thought content normal.    Assessment & Plan:  Mass of upper outer quadrant of left breast  Invasive ductal carcinoma of left breast ([place])   Plan for immediate left breast reconstruction in [place] with Dr. Tollie Pizza with placement of expander and Flex HD.  The risks that can be encountered with and after placement of a breast expander placement were discussed and include the following but not limited to these: bleeding, infection, delayed healing, anesthesia risks, skin sensation changes, injury to structures including nerves, blood vessels, and muscles which may be temporary or permanent, allergies to tape, suture materials and glues, blood products, topical preparations or injected agents, skin contour irregularities, skin discoloration and swelling, deep vein thrombosis, cardiac and pulmonary complications, pain, which may persist, fluid accumulation, wrinkling of the skin over the expander, changes in nipple or breast sensation,  expander leakage or rupture, faulty position of the expander, persistent pain, formation of tight scar tissue around the expander (capsular contracture), possible need for revisional surgery or staged procedures.   [place], DO

## 2018-01-13 ENCOUNTER — Other Ambulatory Visit: Payer: Self-pay | Admitting: General Surgery

## 2018-01-13 DIAGNOSIS — C50912 Malignant neoplasm of unspecified site of left female breast: Secondary | ICD-10-CM

## 2018-01-18 ENCOUNTER — Telehealth: Payer: Self-pay | Admitting: Plastic Surgery

## 2018-01-18 ENCOUNTER — Encounter
Admission: RE | Admit: 2018-01-18 | Discharge: 2018-01-18 | Disposition: A | Discharge status: Home / Self Care | Payer: Medicare Other | Source: Ambulatory Visit | Attending: General Surgery | Admitting: General Surgery

## 2018-01-18 ENCOUNTER — Other Ambulatory Visit: Payer: Self-pay

## 2018-01-18 MED ORDER — DIAZEPAM 2 MG PO TABS: 2.0000 mg | ORAL_TABLET | Freq: Three times a day (TID) | ORAL | 0 refills | Status: DC | PRN

## 2018-01-18 MED ORDER — ONDANSETRON HCL 4 MG PO TABS: 4.0000 mg | ORAL_TABLET | Freq: Every day | ORAL | 1 refills | Status: DC | PRN

## 2018-01-18 MED ORDER — HYDROCODONE-ACETAMINOPHEN 5-325 MG PO TABS: 1.0000 | ORAL_TABLET | Freq: Four times a day (QID) | ORAL | 0 refills | Status: DC | PRN

## 2018-01-18 MED ORDER — CIPROFLOXACIN HCL 500 MG PO TABS: 500.0000 mg | ORAL_TABLET | Freq: Two times a day (BID) | ORAL | 0 refills | Status: AC

## 2018-01-18 NOTE — Addendum Note (Signed)
Addended by: Wallace Going on: 01/18/2018 07:47 PM   Modules accepted: Orders

## 2018-01-18 NOTE — Telephone Encounter (Signed)
Patient called stating that her pharmacy has not yet received orders for her prescriptions, and would like to be able to pick up medication before surgery if possible.

## 2018-01-18 NOTE — Patient Instructions (Addendum)
Your procedure is scheduled on: 01-27-18 [facility] Report to [place] @ 9 AM (2ND DESK ON RIGHT)  Remember: Instructions that are not followed completely may result in serious medical risk, up to and including death, or upon the discretion of your surgeon and anesthesiologist your surgery may need to be rescheduled.    _x___ 1. Do not eat food after midnight the night before your procedure. NO GUM OR CANDY AFTER MIDNIGHT.  You may drink clear liquids up to 2 hours before you are scheduled to arrive at the hospital for your procedure.  Do not drink clear liquids within 2 hours of your scheduled arrival to the hospital.  Clear liquids include  --Water or Apple juice without pulp  --Clear carbohydrate beverage such as ClearFast or Gatorade  --Black Coffee or Clear Tea (No milk, no creamers, do not add anything to the coffee or Tea   ____Ensure clear carbohydrate drink on the way to the hospital for bariatric patients  ____Ensure clear carbohydrate drink 3 hours before surgery for Dr Dwyane Luo patients if physician instructed.     __x__ 2. No Alcohol for 24 hours before or after surgery.   __x__3. No Smoking or e-cigarettes for 24 prior to surgery.  Do not use any chewable tobacco products for at least 6 hour prior to surgery   ____  4. Bring all medications with you on the day of surgery if instructed.    __x__ 5. Notify your doctor if there is any change in your medical condition     (cold, fever, infections).    x___6. On the morning of surgery brush your teeth with toothpaste and water.  You may rinse your mouth with mouth wash if you wish.  Do not swallow any toothpaste or mouthwash.   Do not wear jewelry, make-up, hairpins, clips or nail polish.  Do not wear lotions, powders, or perfumes. You may wear deodorant.  Do not shave 48 hours prior to surgery. Men may shave face and neck.  Do not bring valuables to the hospital.    [facility] is not responsible for  any belongings or valuables.               Contacts, dentures or bridgework may not be worn into surgery.  Leave your suitcase in the car. After surgery it may be brought to your room.  For patients admitted to the hospital, discharge time is determined by your treatment team.  _  Patients discharged the day of surgery will not be allowed to drive home.  You will need someone to drive you home and stay with you the night of your procedure.    Please read over the following fact sheets that you were given:   [facility] Preparing for Surgery  _x___ TAKE THE FOLLOWING MEDICATION THE MORNING OF SURGERY WITH A SMALL SIP OF WATER. These include:  1. ATENOLOL  2. PROTONIX   3. ZYRTEC  4.  5.  6.  ____Fleets enema or Magnesium Citrate as directed.   _x___ Use CHG Soap or sage wipes as directed on instruction sheet   _X___ Use inhalers on the day of surgery and bring to hospital day of surgery-USE YOUR ALBUTEROL INHALER DAY OF SURGERY AND [place]  ____ Stop Metformin and Janumet 2 days prior to surgery.    ____ Take 1/2 of usual insulin dose the night before surgery and none on the morning surgery.   ____ Follow recommendations from Cardiologist, Pulmonologist  or PCP regarding stopping Aspirin, Coumadin, Plavix ,Eliquis, Effient, or Pradaxa, and Pletal.  X____Stop Anti-inflammatories such as Advil, Aleve, Ibuprofen, Motrin, Naproxen, Naprosyn, Goodies powders or aspirin products NOW- OK to take Tylenol    ____ Stop supplements until after surgery.     ____ Bring C-Pap to the hospital.

## 2018-01-19 ENCOUNTER — Inpatient Hospital Stay: Admission: RE | Admit: 2018-01-19 | Payer: Medicare Other | Source: Ambulatory Visit

## 2018-01-20 ENCOUNTER — Encounter
Admission: RE | Admit: 2018-01-20 | Discharge: 2018-01-20 | Disposition: A | Discharge status: Home / Self Care | Payer: Medicare Other | Source: Ambulatory Visit | Attending: General Surgery | Admitting: General Surgery

## 2018-01-20 DIAGNOSIS — R9431 Abnormal electrocardiogram [ECG] [EKG]: Secondary | ICD-10-CM | POA: Diagnosis not present

## 2018-01-20 DIAGNOSIS — Z01818 Encounter for other preprocedural examination: Secondary | ICD-10-CM | POA: Diagnosis present

## 2018-01-20 DIAGNOSIS — I10 Essential (primary) hypertension: Secondary | ICD-10-CM | POA: Diagnosis not present

## 2018-01-20 LAB — POTASSIUM: Potassium: 3.4 mmol/L — ABNORMAL LOW (ref 3.5–5.1)

## 2018-01-20 NOTE — Pre-Procedure Instructions (Signed)
[facility]  ECHO COMPLETE WO IMAGING ENHANCING AGENT  Order# [id number]  Reading physician: Nelva Bush, MD Ordering physician: Karen Kitchens, NP Study date: 10/14/17  Study Result   Result status: Final result                   *[place], [place] [address]                            [phone]  ------------------------------------------------------------------- Transthoracic Echocardiography  Patient:    Mariah Hogan, Mariah Hogan MR #:       [id number] Study Date: 10/14/2017 Gender:     F Age:        67 Height:     165.1 cm Weight:     74 kg BSA:        1.86 m^2 Pt. Status: Room:   PERFORMING   [facility]  REFERRING    Mclaughlin, Miriam K  ATTENDING    Winifred Olive E  REFERRING    Honor Loh E  SONOGRAPHER  Charmayne Sheer, RDCS  cc:  ------------------------------------------------------------------- LV EF: 55% -   60%  ------------------------------------------------------------------- Indications:      Pre-procedural examination; Invasive ductal carcinoma of left breast.  ------------------------------------------------------------------- History:   PMH:  HLD, Breast cancer.  Risk factors:  Hypertension.   ------------------------------------------------------------------- Study Conclusions  - Left ventricle: The cavity size was normal. There was mild focal   basal hypertrophy of the septum. Systolic function was normal.   The estimated ejection fraction was in the range of 55% to 60%.   Wall motion was normal; there were no regional wall motion   abnormalities. Doppler parameters are consistent with abnormal   left ventricular relaxation (grade 1 diastolic dysfunction). - Right ventricle: The cavity size was normal. Wall thickness was   normal. Systolic function was  normal.  ------------------------------------------------------------------- Study data:   Study status:  Routine.  Procedure:  The patient reported no pain pre or post test. Transthoracic echocardiography. Image quality was fair.          Transthoracic echocardiography. M-mode, complete 2D, spectral Doppler, and color Doppler. Birthdate:  Patient birthdate: 1950/05/23.  Age:  Patient is 67 yr old.  Sex:  Gender: female.    BMI: 27.1 kg/m^2.  Blood pressure:   154/81  Patient status:  Outpatient.  Study date:  Study date: 10/14/2017. Study time: 10:18 AM.  Location:  Echo laboratory.  -------------------------------------------------------------------  ------------------------------------------------------------------- Left ventricle:  The cavity size was normal. There was mild focal basal hypertrophy of the septum. Systolic function was normal. The estimated ejection fraction was in the range of 55% to 60%. Wall motion was normal; there were no regional wall motion abnormalities. Doppler parameters are consistent with abnormal left ventricular relaxation (grade 1 diastolic dysfunction).  ------------------------------------------------------------------- Aortic valve:   Probably trileaflet. Cusp separation was normal. Doppler:  Transvalvular velocity was within the normal range. There was no stenosis. There was no regurgitation.    VTI ratio of LVOT to aortic valve: 0.64. Valve area (VTI): 2.02 cm^2. Indexed valve area (VTI): 1.09 cm^2/m^2. Peak velocity ratio of LVOT  to aortic valve: 0.65. Valve area (Vmax): 2.03 cm^2. Indexed valve area (Vmax): 1.09 cm^2/m^2. Mean velocity ratio of LVOT to aortic valve: 0.58. Valve area (Vmean): 1.84 cm^2. Indexed valve area (Vmean): 0.99 cm^2/m^2.    Mean gradient (S): 5 mm Hg. Peak gradient (S): 9 mm Hg.  ------------------------------------------------------------------- Aorta:  Aortic root: The aortic root was normal in size. Ascending  aorta: The ascending aorta was normal in size.  ------------------------------------------------------------------- Mitral valve:   Structurally normal valve.   Leaflet separation was normal.  Doppler:  Transvalvular velocity was within the normal range. There was no evidence for stenosis. There was no regurgitation.    Valve area by pressure half-time: 3.55 cm^2. Indexed valve area by pressure half-time: 1.91 cm^2/m^2. Valve area by continuity equation (using LVOT flow): 2.93 cm^2. Indexed valve area by continuity equation (using LVOT flow): 1.58 cm^2/m^2. Mean gradient (D): 1 mm Hg.  ------------------------------------------------------------------- Left atrium:  The atrium was normal in size.  ------------------------------------------------------------------- Right ventricle:  The cavity size was normal. Wall thickness was normal. Systolic function was normal.  ------------------------------------------------------------------- Pulmonic valve:    Structurally normal valve.   Cusp separation was normal.  Doppler:  Transvalvular velocity was within the normal range. There was no regurgitation.  ------------------------------------------------------------------- Tricuspid valve:   Structurally normal valve.   Leaflet separation was normal.  Doppler:  Transvalvular velocity was within the normal range. There was no regurgitation.  ------------------------------------------------------------------- Pulmonary artery:   The main pulmonary artery was normal-sized. Systolic pressure could not be accurately estimated.  ------------------------------------------------------------------- Right atrium:  The atrium was normal in size.  ------------------------------------------------------------------- Pericardium:  There was no pericardial effusion.  ------------------------------------------------------------------- Systemic veins: Inferior vena cava: The vessel was normal  in size. The respirophasic diameter changes were in the normal range (>= 50%), consistent with normal central venous pressure.  ------------------------------------------------------------------- Measurements   Left ventricle                           Value          Reference  LV ID, ED, PLAX chordal                  46.4  mm       43 - 52  LV ID, ES, PLAX chordal                  27.1  mm       23 - 38  LV fx shortening, PLAX chordal           42    %        >=29  LV PW thickness, ED                      12.1  mm       ----------  IVS/LV PW ratio, ED                      0.79           <=1.3  Stroke volume, 2D                        62    ml       ----------  Stroke volume/bsa, 2D                    33    ml/m^2   ----------  LV  e&', lateral                           7.83  cm/s     ----------  LV E/e&', lateral                         7.8            ----------  LV e&', medial                            4.79  cm/s     ----------  LV E/e&', medial                          12.76          ----------  LV e&', average                           6.31  cm/s     ----------  LV E/e&', average                         9.68           ----------    Ventricular septum                       Value          Reference  IVS thickness, ED                        9.52  mm       ----------    LVOT                                     Value          Reference  LVOT ID, S                               20    mm       ----------  LVOT area                                3.14  cm^2     ----------  LVOT peak velocity, S                    96.8  cm/s     ----------  LVOT mean velocity, S                    62    cm/s     ----------  LVOT VTI, S                              19.6  cm       ----------    Aortic valve                             Value  Reference  Aortic valve peak velocity, S            150   cm/s     ----------  Aortic valve mean velocity, S            106   cm/s     ----------  Aortic  valve VTI, S                      30.5  cm       ----------  Aortic mean gradient, S                  5     mm Hg    ----------  Aortic peak gradient, S                  9     mm Hg    ----------  VTI ratio, LVOT/AV                       0.64           ----------  Aortic valve area, VTI                   2.02  cm^2     ----------  Aortic valve area/bsa, VTI               1.09  cm^2/m^2 ----------  Velocity ratio, peak, LVOT/AV            0.65           ----------  Aortic valve area, peak velocity         2.03  cm^2     ----------  Aortic valve area/bsa, peak              1.09  cm^2/m^2 ----------  velocity  Velocity ratio, mean, LVOT/AV            0.58           ----------  Aortic valve area, mean velocity         1.84  cm^2     ----------  Aortic valve area/bsa, mean              0.99  cm^2/m^2 ----------  velocity    Aorta                                    Value          Reference  Aortic root ID, ED                       33    mm       ----------    Left atrium                              Value          Reference  LA ID, A-P, ES                           34    mm       ----------  LA ID/bsa, A-P  1.83  cm/m^2   <=2.2  LA volume, S                             47.3  ml       ----------  LA volume/bsa, S                         25.4  ml/m^2   ----------  LA volume, ES, 1-p A4C                   37.8  ml       ----------  LA volume/bsa, ES, 1-p A4C               20.3  ml/m^2   ----------  LA volume, ES, 1-p A2C                   59.3  ml       ----------  LA volume/bsa, ES, 1-p A2C               31.9  ml/m^2   ----------    Mitral valve                             Value          Reference  Mitral E-wave peak velocity              61.1  cm/s     ----------  Mitral A-wave peak velocity              74.7  cm/s     ----------  Mitral mean velocity, D                  50.6  cm/s     ----------  Mitral deceleration time                 211   ms       150 - 230   Mitral pressure half-time                62    ms       ----------  Mitral mean gradient, D                  1     mm Hg    ----------  Mitral E/A ratio, peak                   0.8            ----------  Mitral valve area, PHT, DP               3.55  cm^2     ----------  Mitral valve area/bsa, PHT, DP           1.91  cm^2/m^2 ----------  Mitral valve area, LVOT                  2.93  cm^2     ----------  continuity  Mitral valve area/bsa, LVOT              1.58  cm^2/m^2 ----------  continuity  Mitral annulus VTI, D                    21    cm       ----------  Right atrium                             Value          Reference  RA ID, S-I, ES                           52.7  mm       ----------  RA ID, M-L, ES, A4C                      36.1  mm       30 - 46  RA ID, S-I, ES, A4C                      48.7  mm       34 - 49  RA area, ES, A4C                         13.1  cm^2     8.3 - 19.5  RA volume, ES, A/L                       29    ml       ----------  RA volume/bsa, ES, A/L                   15.6  ml/m^2   ----------    Right ventricle                          Value          Reference  RV ID, minor axis, ED, A4C base          38.1  mm       ----------  RV ID, minor axis, ED, A4C mid           29.3  mm       ----------  RV s&', lateral, S                        12.1  cm/s     ----------    Pulmonic valve                           Value          Reference  Pulmonic valve peak velocity, S          95.7  cm/s     ----------  Pulmonic acceleration time               88    ms       ----------  Legend: (L)  and  (H)  mark values outside specified reference range.  ------------------------------------------------------------------- Prepared and Electronically Authenticated by  Nelva Bush, MD 2019-08-07T17:48:12  [facility] Images   Show images for ECHOCARDIOGRAM COMPLETE  Patient Information   Patient Name Jalah, Warmuth Sex Female DOB Jul 30, 1950 SSN [id number]   Reason for Exam  Priority: Routine  Dx: Pre-procedural examination [X41.287 (ICD-10-CM)]; Invasive ductal carcinoma of left breast ([place]) [C50.912 (ICD-10-CM)]; Hypokalemia [E87.6 (ICD-10-CM)]  Comments:   Surgical History   Surgical History   No past medical history on file.    Other Surgical History   Procedure Laterality  Date Comment Source  ABDOMINAL HYSTERECTOMY  1998  Provider  bladder tack  [phone]  Provider  BREAST BIOPSY Bilateral   Provider  BREAST BIOPSY Left 1990  Provider  BREAST BIOPSY Left 2018 core bx- neg Provider  BREAST BIOPSY Left 08/04/2017 left UOQ 2 oclock INVASIVE DUCTAL CARCINOMA. Provider  BREAST CYST EXCISION  x3  Provider  BREAST SURGERY Left 02/13/14 Fibrocystic changes, pseudo-angiomatous stromal hyperplasia. No atypia or malignancy. Provider  CARPAL TUNNEL RELEASE  x2  Provider  CHOLECYSTECTOMY  2008  Provider  COLONOSCOPY WITH PROPOFOL N/A 01/12/2015 Procedure: COLONOSCOPY WITH PROPOFOL; Surgeon: Manya Silvas, MD; Location: [facility] ENDOSCOPY; Service: Endoscopy; Laterality: N/A; Provider  ESOPHAGOGASTRODUODENOSCOPY (EGD) WITH PROPOFOL N/A 01/12/2015 Procedure: ESOPHAGOGASTRODUODENOSCOPY (EGD) WITH PROPOFOL; Surgeon: Manya Silvas, MD; Location: [facility] ENDOSCOPY; Service: Endoscopy; Laterality: N/A; Provider  NASAL RECONSTRUCTION  1983  Provider  PORTACATH PLACEMENT N/A 10/21/2017 Procedure: INSERTION PORT-A-CATH; Surgeon: Robert Bellow, MD; Location: [facility] ORS; Service: General; Laterality: N/A; Provider  SAVORY DILATION N/A 01/12/2015 Procedure: Azzie Almas DILATION; Surgeon: Manya Silvas, MD; Location: [facility] ENDOSCOPY; Service: Endoscopy; Laterality: N/A; Provider  TONSILLECTOMY AND ADENOIDECTOMY  1956  Provider    Performing Technologist/Nurse   Performing Technologist/Nurse: Deland Pretty                    Implants    No active implants to display in this view.  Order-Level Documents:   There are no order-level  documents.  Encounter-Level Documents - 10/14/2017:   Electronic signature on 10/14/2017 10:02 AM - Signed      Signed   Electronically signed by Nelva Bush, MD on 10/14/17 at 1748 EDT  Printable Result Report   Result Report   External Result Report   External Result Report

## 2018-01-21 NOTE — Telephone Encounter (Signed)
Patient has questions about when to start the antibiotic, before or after surgery?

## 2018-01-22 ENCOUNTER — Ambulatory Visit (INDEPENDENT_AMBULATORY_CARE_PROVIDER_SITE_OTHER): Payer: Medicare Other | Admitting: Plastic Surgery

## 2018-01-22 ENCOUNTER — Telehealth: Payer: Self-pay

## 2018-01-22 ENCOUNTER — Encounter: Payer: Self-pay | Admitting: Plastic Surgery

## 2018-01-22 VITALS — BP 128/76 | HR 76 | Ht 66.0 in | Wt 165.0 lb

## 2018-01-22 DIAGNOSIS — C50912 Malignant neoplasm of unspecified site of left female breast: Secondary | ICD-10-CM

## 2018-01-22 MED ORDER — POTASSIUM CHLORIDE CRYS ER 20 MEQ PO TBCR: 20.0000 meq | EXTENDED_RELEASE_TABLET | Freq: Two times a day (BID) | ORAL | 0 refills | Status: DC

## 2018-01-22 NOTE — H&P (View-Only) (Signed)
Patient ID: Mariah Hogan, female    DOB: 1950-10-07, 67 y.o.   MRN: [id number]   Chief Complaint  Patient presents with  . Breast Mass    The patient is a 67 yrs old wf here for follow up history and physical for left breast reconstruction next week.  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  Her preop bra is 36 C.    She is aware that we will not plan on NAC sparing mastectomy.     Review of Systems  Constitutional: Negative.   HENT: Negative.   Eyes: Negative.   Respiratory: Negative.   Gastrointestinal: Negative.   Endocrine: Negative.   Genitourinary: Negative.   Musculoskeletal: Negative.   Skin: Negative.   Allergic/Immunologic: Negative.   Neurological: Negative.   Psychiatric/Behavioral: Negative.     Past Medical History:  Diagnosis Date  . Anemia   . Arthritis   . Asthma    H/O YEARS AGO-NOW HAS COUGHING SPELLS WHICH IS WHY SHE HAS HER ALBUTEROL INHALER  . Breast cancer ([place]) 08/04/2017   left breast INVASIVE DUCTAL CARCINOMA.  . Cancer ([place])    [phone] basal cell carcinoma  . Complication of anesthesia    HARD TIME WAKING UP  . Dysrhythmia    TACHYCARDIA  . GERD (gastroesophageal reflux disease)   . History of hiatal hernia    SMALL  . Hyperlipidemia   . Hypertension   . PONV (postoperative nausea and vomiting)     Past Surgical History:  Procedure Laterality Date  . ABDOMINAL HYSTERECTOMY  1998  . bladder tack  [id number]  . BREAST BIOPSY Bilateral   . BREAST BIOPSY Left 1990  . BREAST BIOPSY Left 2018   core bx- neg  . 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  Marland Kitchen BREAST CYST EXCISION  x3  . BREAST SURGERY Left 02/13/14   Fibrocystic changes, pseudo-angiomatous stromal hyperplasia. No atypia or malignancy.  . CARPAL TUNNEL  RELEASE  x2  . CHOLECYSTECTOMY  2008  . COLONOSCOPY WITH PROPOFOL N/A 01/12/2015   Procedure: COLONOSCOPY WITH PROPOFOL;  Surgeon: Manya Silvas, MD;  Location[facility] ENDOSCOPY;  Service: Endoscopy;  Laterality: N/A;  . ESOPHAGOGASTRODUODENOSCOPY (EGD) WITH PROPOFOL N/A 01/12/2015   Procedure: ESOPHAGOGASTRODUODENOSCOPY (EGD) WITH PROPOFOL;  Surgeon: Manya Silvas, MD;  Location: [facility] ENDOSCOPY;  Service: Endoscopy;  Laterality: N/A;  . NASAL RECONSTRUCTION  1983  . PORTACATH PLACEMENT N/A 10/21/2017   Procedure: INSERTION PORT-A-CATH;  Surgeon: Robert Bellow, MD;  Location: [facility] ORS;  Service: General;  Laterality: N/A;  . SAVORY DILATION N/A 01/12/2015   Procedure: Azzie Almas DILATION;  Surgeon: Manya Silvas, MD;  Location: [facility] ENDOSCOPY;  Service: Endoscopy;  Laterality: N/A;  . TONSILLECTOMY AND ADENOIDECTOMY  1956      Current Outpatient Medications:  .  albuterol (PROVENTIL HFA;VENTOLIN HFA) 108 (90 Base) MCG/ACT inhaler, Inhale 2 puffs into the lungs every 6 (six) hours as needed for wheezing or shortness of breath., Disp: 1 Inhaler, Rfl: 1 .  atenolol (TENORMIN) 50 MG tablet, Take 50 mg by mouth every morning. , Disp: , Rfl:  .  cetirizine (ZYRTEC) 10 MG tablet, Take 10 mg by mouth daily before lunch. , Disp: , Rfl:  .  pantoprazole (PROTONIX) 40 MG tablet,  Take 40 mg by mouth 2 (two) times daily. , Disp: , Rfl:  .  triamterene-hydrochlorothiazide (MAXZIDE-25) 37.5-25 MG tablet, Take 1 tablet by mouth every morning., Disp: , Rfl:  .  ciprofloxacin (CIPRO) 500 MG tablet, Take 1 tablet (500 mg total) by mouth 2 (two) times daily for 5 days., Disp: 10 tablet, Rfl: 0 .  citalopram (CELEXA) 20 MG tablet, Take 20 mg by mouth at bedtime. , Disp: , Rfl:  .  diazepam (VALIUM) 2 MG tablet, Take 1 tablet (2 mg total) by mouth every 8 (eight) hours as needed for muscle spasms., Disp: 30 tablet, Rfl: 0 .  HYDROcodone-acetaminophen (NORCO) 5-325 MG tablet, Take 1 tablet by mouth every 6  (six) hours as needed for moderate pain., Disp: 30 tablet, Rfl: 0 .  lidocaine-prilocaine (EMLA) cream, Apply to affected area once, Disp: 30 g, Rfl: 3 .  ondansetron (ZOFRAN) 4 MG tablet, Take 1 tablet (4 mg total) by mouth daily as needed for nausea or vomiting., Disp: 10 tablet, Rfl: 1 .  ondansetron (ZOFRAN) 8 MG tablet, Take 1 tablet (8 mg total) by mouth 2 (two) times daily as needed. Start on the third day after chemotherapy. (Patient not taking: Reported on 01/12/2018), Disp: 30 tablet, Rfl: 1 .  potassium chloride SA (K-DUR,KLOR-CON) 20 MEQ tablet, Take 1 tablet (20 mEq total) by mouth 2 (two) times daily., Disp: 30 tablet, Rfl: 0 .  valACYclovir (VALTREX) 1000 MG tablet, Take 1,000 mg by mouth See admin instructions. Take 1000 mg in the morning , may take a second 1000 mg dose as needed for fever blisters , Disp: , Rfl:  .  valsartan (DIOVAN) 80 MG tablet, Take 80 mg by mouth every morning. , Disp: , Rfl:    Objective:   Vitals:   01/22/18 1018  BP: 128/76  Pulse: 76  SpO2: 98%    Physical Exam  Constitutional: She is oriented to person, place, and time. She appears well-developed and well-nourished.  HENT:  Head: Normocephalic and atraumatic.  Eyes: Pupils are equal, round, and reactive to light. EOM are normal.  Cardiovascular: Normal rate.  Pulmonary/Chest: Effort normal.  Abdominal: Soft. She exhibits no distension.  Neurological: She is alert and oriented to person, place, and time.  Skin: Skin is warm.  Psychiatric: She has a normal mood and affect. Her behavior is normal. Judgment and thought content normal.    Assessment & Plan:  Invasive ductal carcinoma of left breast ([place])  Plan for left breast reconstruction with expander and Flex HD placement.  She is aware that there is a second procedure for removal of the expander and placement of the implant.  The risks that can be encountered with and after placement of a breast expander placement were discussed and  include the following but not limited to these: bleeding, infection, delayed healing, anesthesia risks, skin sensation changes, injury to structures including nerves, blood vessels, and muscles which may be temporary or permanent, allergies to tape, suture materials and glues, blood products, topical preparations or injected agents, skin contour irregularities, skin discoloration and swelling, deep vein thrombosis, cardiac and pulmonary complications, pain, which may persist, fluid accumulation, wrinkling of the skin over the expander, changes in nipple or breast sensation, expander leakage or rupture, faulty position of the expander, persistent pain, formation of tight scar tissue around the expander (capsular contracture), possible need for revisional surgery or staged procedures.     [place], DO

## 2018-01-22 NOTE — Progress Notes (Signed)
   Patient ID: Mariah Hogan, female    DOB: 11/22/1950, 67 y.o.   MRN: [id number]   Chief Complaint  Patient presents with  . Breast Mass    The patient is a 67 yrs old wf here for follow up history and physical for left breast reconstruction next week.  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  Her preop bra is 36 C.    She is aware that we will not plan on NAC sparing mastectomy.     Review of Systems  Constitutional: Negative.   HENT: Negative.   Eyes: Negative.   Respiratory: Negative.   Gastrointestinal: Negative.   Endocrine: Negative.   Genitourinary: Negative.   Musculoskeletal: Negative.   Skin: Negative.   Allergic/Immunologic: Negative.   Neurological: Negative.   Psychiatric/Behavioral: Negative.     Past Medical History:  Diagnosis Date  . Anemia   . Arthritis   . Asthma    H/O YEARS AGO-NOW HAS COUGHING SPELLS WHICH IS WHY SHE HAS HER ALBUTEROL INHALER  . Breast cancer (HCC) 08/04/2017   left breast INVASIVE DUCTAL CARCINOMA.  . Cancer (HCC)    1993,2007,2014 basal cell carcinoma  . Complication of anesthesia    HARD TIME WAKING UP  . Dysrhythmia    TACHYCARDIA  . GERD (gastroesophageal reflux disease)   . History of hiatal hernia    SMALL  . Hyperlipidemia   . Hypertension   . PONV (postoperative nausea and vomiting)     Past Surgical History:  Procedure Laterality Date  . ABDOMINAL HYSTERECTOMY  1998  . bladder tack  2002,2008  . BREAST BIOPSY Bilateral   . BREAST BIOPSY Left 1990  . BREAST BIOPSY Left 2018   core bx- neg  . 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  . BREAST CYST EXCISION  x3  . BREAST SURGERY Left 02/13/14   Fibrocystic changes, pseudo-angiomatous stromal hyperplasia. No atypia or malignancy.  . CARPAL TUNNEL  RELEASE  x2  . CHOLECYSTECTOMY  2008  . COLONOSCOPY WITH PROPOFOL N/A 01/12/2015   Procedure: COLONOSCOPY WITH PROPOFOL;  Surgeon: Robert T Elliott, MD;  Location: [facility];  Service: Endoscopy;  Laterality: N/A;  . ESOPHAGOGASTRODUODENOSCOPY (EGD) WITH PROPOFOL N/A 01/12/2015   Procedure: ESOPHAGOGASTRODUODENOSCOPY (EGD) WITH PROPOFOL;  Surgeon: Robert T Elliott, MD;  Location: [facility];  Service: Endoscopy;  Laterality: N/A;  . NASAL RECONSTRUCTION  1983  . PORTACATH PLACEMENT N/A 10/21/2017   Procedure: INSERTION PORT-A-CATH;  Surgeon: Byrnett, Jeffrey W, MD;  Location: [facility];  Service: General;  Laterality: N/A;  . SAVORY DILATION N/A 01/12/2015   Procedure: SAVORY DILATION;  Surgeon: Robert T Elliott, MD;  Location: [facility];  Service: Endoscopy;  Laterality: N/A;  . TONSILLECTOMY AND ADENOIDECTOMY  1956      Current Outpatient Medications:  .  albuterol (PROVENTIL HFA;VENTOLIN HFA) 108 (90 Base) MCG/ACT inhaler, Inhale 2 puffs into the lungs every 6 (six) hours as needed for wheezing or shortness of breath., Disp: 1 Inhaler, Rfl: 1 .  atenolol (TENORMIN) 50 MG tablet, Take 50 mg by mouth every morning. , Disp: , Rfl:  .  cetirizine (ZYRTEC) 10 MG tablet, Take 10 mg by mouth daily before lunch. , Disp: , Rfl:  .  pantoprazole (PROTONIX) 40 MG tablet,   Take 40 mg by mouth 2 (two) times daily. , Disp: , Rfl:  .  triamterene-hydrochlorothiazide (MAXZIDE-25) 37.5-25 MG tablet, Take 1 tablet by mouth every morning., Disp: , Rfl:  .  ciprofloxacin (CIPRO) 500 MG tablet, Take 1 tablet (500 mg total) by mouth 2 (two) times daily for 5 days., Disp: 10 tablet, Rfl: 0 .  citalopram (CELEXA) 20 MG tablet, Take 20 mg by mouth at bedtime. , Disp: , Rfl:  .  diazepam (VALIUM) 2 MG tablet, Take 1 tablet (2 mg total) by mouth every 8 (eight) hours as needed for muscle spasms., Disp: 30 tablet, Rfl: 0 .  HYDROcodone-acetaminophen (NORCO) 5-325 MG tablet, Take 1 tablet by mouth every 6  (six) hours as needed for moderate pain., Disp: 30 tablet, Rfl: 0 .  lidocaine-prilocaine (EMLA) cream, Apply to affected area once, Disp: 30 g, Rfl: 3 .  ondansetron (ZOFRAN) 4 MG tablet, Take 1 tablet (4 mg total) by mouth daily as needed for nausea or vomiting., Disp: 10 tablet, Rfl: 1 .  ondansetron (ZOFRAN) 8 MG tablet, Take 1 tablet (8 mg total) by mouth 2 (two) times daily as needed. Start on the third day after chemotherapy. (Patient not taking: Reported on 01/12/2018), Disp: 30 tablet, Rfl: 1 .  potassium chloride SA (K-DUR,KLOR-CON) 20 MEQ tablet, Take 1 tablet (20 mEq total) by mouth 2 (two) times daily., Disp: 30 tablet, Rfl: 0 .  valACYclovir (VALTREX) 1000 MG tablet, Take 1,000 mg by mouth See admin instructions. Take 1000 mg in the morning , may take a second 1000 mg dose as needed for fever blisters , Disp: , Rfl:  .  valsartan (DIOVAN) 80 MG tablet, Take 80 mg by mouth every morning. , Disp: , Rfl:    Objective:   Vitals:   01/22/18 1018  BP: 128/76  Pulse: 76  SpO2: 98%    Physical Exam  Constitutional: She is oriented to person, place, and time. She appears well-developed and well-nourished.  HENT:  Head: Normocephalic and atraumatic.  Eyes: Pupils are equal, round, and reactive to light. EOM are normal.  Cardiovascular: Normal rate.  Pulmonary/Chest: Effort normal.  Abdominal: Soft. She exhibits no distension.  Neurological: She is alert and oriented to person, place, and time.  Skin: Skin is warm.  Psychiatric: She has a normal mood and affect. Her behavior is normal. Judgment and thought content normal.    Assessment & Plan:  Invasive ductal carcinoma of left breast (HCC)  Plan for left breast reconstruction with expander and Flex HD placement.  She is aware that there is a second procedure for removal of the expander and placement of the implant.  The risks that can be encountered with and after placement of a breast expander placement were discussed and  include the following but not limited to these: bleeding, infection, delayed healing, anesthesia risks, skin sensation changes, injury to structures including nerves, blood vessels, and muscles which may be temporary or permanent, allergies to tape, suture materials and glues, blood products, topical preparations or injected agents, skin contour irregularities, skin discoloration and swelling, deep vein thrombosis, cardiac and pulmonary complications, pain, which may persist, fluid accumulation, wrinkling of the skin over the expander, changes in nipple or breast sensation, expander leakage or rupture, faulty position of the expander, persistent pain, formation of tight scar tissue around the expander (capsular contracture), possible need for revisional surgery or staged procedures.     Hayze Gazda S Edee Nifong, DO 

## 2018-01-22 NOTE — Telephone Encounter (Signed)
-----   Message from Robert Bellow, MD sent at 01/22/2018  9:09 AM EST ----- Find out if patient has been taking any potassium supplements, if so, she needs to increase frequency by 1 tab/ day ( If taking QD, increase to BID, etc). If not taking any supplement, send in RX for KCL 20 mEq tablets, # 30, one po BID.  Thanks.   ----- Message ----- From: Buel Ream, [facility] [place] Sent: 01/20/2018   2:38 PM EST To: Robert Bellow, MD

## 2018-01-22 NOTE — Telephone Encounter (Signed)
Notified patient as instructed, patient pleased. She is not currently taking potassium and we will call this in for her at [employer].

## 2018-01-26 ENCOUNTER — Encounter: Payer: Self-pay | Admitting: *Deleted

## 2018-01-26 MED ORDER — CEFAZOLIN SODIUM-DEXTROSE 2-4 GM/100ML-% IV SOLN
2.0000 g | INTRAVENOUS | Status: AC
  Administered 2018-01-27: 2 g via INTRAVENOUS

## 2018-01-26 NOTE — Pre-Procedure Instructions (Signed)
Progress Notes - documented in this encounter  Mariah Hogan, McKenzie - 01/18/2018 3:30 PM EST Formatting of this note might be different from the original. Mariah Hogan is a 67 y.o. female here for acute patient visit to discuss: Chief Complaint  Patient presents with  . Follow-up  BP issues   History of Present Illness:  67 year old female with history of hypertension comes in with persistently elevated blood pressures. She prior was on Maxide but during chemotherapy had electrolyte issues and this was discontinued. She was then started on losartan hydrochlorothiazide. This was recalled and also some concern it was causing cough. She recently was started on valsartan but blood pressure has remained elevated in the 500-370 systolic range. She notes more swelling in her ankles. Denies chest pain, palpitations. Has had some neck pain today but thinks this was due to how she slept. She has breast cancer and is going to be having a left mastectomy and reconstruction on November 20.  Past Medical History:   Past Medical History:  Diagnosis Date  . Allergic state  . Arthritis  ?inflammatory, trigger nodule, plantar fasciitis  . Asthma without status asthmaticus, unspecified  . Breast cancer (CMS-HCC) 09/2017  . Carpal tunnel syndrome  . Chronic diarrhea, unspecified  . Colon polyp  . Esophageal reflux  . GERD (gastroesophageal reflux disease)  . Gross hematuria  Eval Dr. Jacqlyn Larsen with cystoscopy WNL except cystitis changes  . Headache  . History of pneumonia  . Hyperlipidemia  . Hypertension 05/2003  . Urinary incontinence  s/p procedures   Past Surgical History:   Past Surgical History:  Procedure Laterality Date  . Basal cell carcinoma removal  x 4  . Bladder surgery for incontinence July 2002, 2010, 2011  Dr. Jacqlyn Larsen  . Breast cyst x 3  . Carpal tunnel x 2  . CHOLECYSTECTOMY 02/2007  laparoscopic  . CHOLECYSTECTOMY 02/2007  . COLONOSCOPY 08/18/2005  FH Colon Polyps  (Father)  . COLONOSCOPY 09/18/2010  Adenomatous Polyp, FH Colon Polyps (Father): CBF 09/2015  . COLONOSCOPY 01/12/2015  Adenomatous Polyp, FH Colon Polyps (Father): CBF 01/2020  . Deviated septum 1983  . EGD 08/20/2011  09/18/2010, 06/12/2006  . EGD 01/12/2015  Candidal Esophagitis: No repeat per RTE  . HYSTERECTOMY 10/1996  TAH/BSO, Dr. Enzo Bi  . TONSILLECTOMY AND ADENOIDECTOMY 1956   Allergies:   Allergies  Allergen Reactions  . Penicillin Shortness Of Breath, Swelling and Rash  . Taxotere [Docetaxel] Shortness Of Breath  Patient had heart problems, shortness of breath, passed out  . Fluocinonide Rash  . Other Other (See Comments)  Dodecyl gallate: Positive patch test  Elta MD UV Clear SPF 46: Positive patch test  fragrance  . Paraben Other (See Comments)  Positive patch test  . Shellac Other (See Comments)  Positive patch test  . Triamcinolone Rash   Current Medications:   Prior to Admission medications  Medication Sig Taking? Last Dose  atenolol (TENORMIN) 50 MG tablet Take 1 tablet (50 mg total) by mouth once daily Yes Taking  cetirizine (ZYRTEC) 10 MG tablet Take 1 tablet (10 mg total) by mouth once daily Yes Taking  citalopram (CELEXA) 20 MG tablet Take 1.5 tablets (30 mg total) by mouth once daily Yes Taking  cyanocobalamin (VITAMIN B12) 1000 MCG tablet Take 1,000 mcg by mouth once daily. Yes Taking  desoximetasone (TOPICORT) 0.25 % ointment Apply topically 2 (two) times daily. Yes Taking  multivitamin tablet Take 1 tablet by mouth once daily. Yes Taking  pantoprazole (PROTONIX) 40 MG  DR tablet Take 1 tablet (40 mg total) by mouth once daily For stomach. Yes Taking  valACYclovir (VALTREX) 1000 MG tablet Yes Taking  VITAMIN A ORAL Take by mouth. Yes Taking  ascorbic acid, vitamin C, (VITAMIN C) 500 MG tablet Take 500 mg by mouth once daily. Not Taking  triamterene-hydrochlorothiazide (MAXZIDE-25) 37.5-25 mg tablet Take 1 tablet by mouth once daily   Family  History:   Family History  Problem Relation Age of Onset  . Stroke Mother  . Diabetes Mother  . Hypothyroidism Mother  . Allergies Mother  . Rheum arthritis Father  . Osteoporosis (Thinning of bones) Father  . Gout Father  . Stroke Father  . Ulcers Father  . Hyperthyroidism Father  . Colon polyps Father  . Asthma Father  . Allergies Father  . Crohn's disease Father  . Irritable bowel syndrome Father  . Breast cancer Sister   Social History:   Social History   Socioeconomic History  . Marital status: Divorced  Spouse name: Not on file  . Number of children: Not on file  . Years of education: Not on file  . Highest education level: Not on file  Occupational History  . Not on file  Social Needs  . Financial resource strain: Not on file  . Food insecurity:  Worry: Not on file  Inability: Not on file  . Transportation needs:  Medical: Not on file  Non-medical: Not on file  Tobacco Use  . Smoking status: Never Smoker  . Smokeless tobacco: Never Used  Substance and Sexual Activity  . Alcohol use: No  Alcohol/week: 0.0 standard drinks  . Drug use: No  . Sexual activity: Defer  Lifestyle  . Physical activity:  Days per week: Not on file  Minutes per session: Not on file  . Stress: Not on file  Relationships  . Social connections:  Talks on phone: Not on file  Gets together: Not on file  Attends religious service: Not on file  Active member of club or organization: Not on file  Attends meetings of clubs or organizations: Not on file  Relationship status: Not on file  Other Topics Concern  . Not on file  Social History Narrative  Lives alone, divorced. Retired. Has one daughter who is a PA here in the clinic. 1 grandchild.. Very seldom etoh.   Review of Systems:   Per HPI  Vitals:   Vitals:  01/18/18 1535  BP: 152/82  BP Location: Right upper arm  Patient Position: Sitting  BP Cuff Size: Small Adult  Pulse: 90  SpO2: 96%  Weight: 76.5 kg (168 lb  9.6 oz)  Height: 167.6 cm (5' 6" )   Body mass index is 27.21 kg/m.  Physical Exam:   Wt Readings from Last 3 Encounters:  01/18/18 76.5 kg (168 lb 9.6 oz)  12/29/17 74.7 kg (164 lb 9.6 oz)  09/30/17 73.6 kg (162 lb 3.2 oz)   Home blood pressures reviewed. Lowest blood pressure 140/90.  Assessment and Plan:   Persistent hypertension- was on Maxide and did very well with this. She wants to restart this. Will retry Maxide. Discontinue valsartan. If blood pressure rises again can restart the valsartan in conjunction with the Maxide. Continue atenolol as prior. We will follow-up after surgery is completed. She will call with blood pressure readings over the next week.  Paulita Cradle, PA-C  Patient received an After Visit Summary     Electronically signed by Mariah Nightingale, PA at 01/18/2018 4:02 PM EST  Plan of Treatment - documented as of this encounter  Upcoming Encounters Upcoming Encounters  Date Type Specialty Care Team Description  03/18/2018 Office Visit Internal Medicine Paulita Cradle [address]  [place], [place] [address]  [phone]  [phone] (Fax)    Goals - documented as of this encounter  Goal Patient Goal Type Associated Problems Recent Progress Patient-Stated? Author  Patient Goals  General   Yes Mae, Jackson, LPN  Note:   Not eating as many sweets   Visit Diagnoses - documented in this encounter  Diagnosis  Essential hypertension - Primary   Discontinued Medications - documented as of this encounter  Medication Sig Discontinue Reason Start Date End Date  azithromycin (ZITHROMAX) 250 MG tablet  Take 2 tablets (542m) by mouth on Day 1. Take 1 tablet (2554m by mouth on Days 2-5. Therapy completed 12/29/2017 01/18/2018  cholecalciferol (CHOLECALCIFEROL) 1,000 unit tablet  Take by mouth. Therapy completed  01/18/2018  fluticasone (FLONASE) 50 mcg/actuation nasal spray  Place 2  sprays into both nostrils once daily Therapy completed 04/24/2017 01/18/2018  ranitidine (ZANTAC) 300 MG tablet  Take 1 tablet (300 mg total) by mouth nightly Therapy completed 09/30/2017 01/18/2018  valsartan (DIOVAN) 80 MG tablet  Take 1 tablet (80 mg total) by mouth once daily Alternate therapy 12/23/2017 01/18/2018  Images Patient Contacts   Contact Name Contact Address Communication Relationship to Patient  JaLaurine Blazernknown [phone] Rouge La Endoscopy Asc LLCSon or Daughter, Emergency Contact  Document Information  Primary Care Provider Other Service Providers Document Coverage Dates  MiSheron NightingalePAUtahMay 21, 2015May 21, 2015 - Present) DM: [phone]) [phone]) 12WacoustaDHarrisburgePrisma Health Greenville Memorial HospitaleBuckshotNC [id number] Medicine [facility]Full[facility]u[place]Lean[place] [id number]. 11, 2019November 11, 2019 - Nov. 15, 2019November 15, 2019   [address] [address] Encounter Providers Encounter Date  MiSheron NightingalePAUtahAttending) DM: [phone]) [phone]) 12HartleyDThor CliniceGoodmanNC [id number] Medicine Nov. 11, 2019November 11, 2019 - Nov. 15, 2019November 15, 2019

## 2018-01-27 ENCOUNTER — Encounter
Admission: RE | Disposition: A | Discharge status: Home / Self Care | Payer: Self-pay | Source: Ambulatory Visit | Attending: General Surgery

## 2018-01-27 ENCOUNTER — Ambulatory Visit
Admission: RE | Admit: 2018-01-27 | Discharge: 2018-01-27 | Disposition: A | Discharge status: Home / Self Care | Payer: Medicare Other | Source: Ambulatory Visit | Attending: General Surgery | Admitting: General Surgery

## 2018-01-27 ENCOUNTER — Ambulatory Visit: Payer: Medicare Other | Admitting: Anesthesiology

## 2018-01-27 ENCOUNTER — Observation Stay
Admission: RE | Admit: 2018-01-27 | Discharge: 2018-01-28 | Disposition: A | Discharge status: Home / Self Care | Payer: Medicare Other | Source: Ambulatory Visit | Attending: General Surgery | Admitting: General Surgery

## 2018-01-27 ENCOUNTER — Other Ambulatory Visit: Payer: Self-pay

## 2018-01-27 ENCOUNTER — Encounter: Payer: Self-pay | Admitting: *Deleted

## 2018-01-27 DIAGNOSIS — N6321 Unspecified lump in the left breast, upper outer quadrant: Secondary | ICD-10-CM

## 2018-01-27 DIAGNOSIS — I10 Essential (primary) hypertension: Secondary | ICD-10-CM | POA: Diagnosis not present

## 2018-01-27 DIAGNOSIS — Z85828 Personal history of other malignant neoplasm of skin: Secondary | ICD-10-CM | POA: Insufficient documentation

## 2018-01-27 DIAGNOSIS — Z79899 Other long term (current) drug therapy: Secondary | ICD-10-CM | POA: Insufficient documentation

## 2018-01-27 DIAGNOSIS — I4891 Unspecified atrial fibrillation: Secondary | ICD-10-CM | POA: Insufficient documentation

## 2018-01-27 DIAGNOSIS — C50412 Malignant neoplasm of upper-outer quadrant of left female breast: Principal | ICD-10-CM | POA: Insufficient documentation

## 2018-01-27 DIAGNOSIS — J45909 Unspecified asthma, uncomplicated: Secondary | ICD-10-CM | POA: Diagnosis not present

## 2018-01-27 DIAGNOSIS — C50919 Malignant neoplasm of unspecified site of unspecified female breast: Secondary | ICD-10-CM | POA: Diagnosis present

## 2018-01-27 DIAGNOSIS — C50912 Malignant neoplasm of unspecified site of left female breast: Secondary | ICD-10-CM | POA: Diagnosis not present

## 2018-01-27 DIAGNOSIS — K219 Gastro-esophageal reflux disease without esophagitis: Secondary | ICD-10-CM | POA: Insufficient documentation

## 2018-01-27 HISTORY — PX: BREAST RECONSTRUCTION WITH PLACEMENT OF TISSUE EXPANDER AND FLEX HD (ACELLULAR HYDRATED DERMIS): SHX6295

## 2018-01-27 HISTORY — PX: MASTECTOMY W/ SENTINEL NODE BIOPSY: SHX2001

## 2018-01-27 LAB — GLUCOSE, CAPILLARY: Glucose-Capillary: 132 mg/dL — ABNORMAL HIGH (ref 70–99)

## 2018-01-27 SURGERY — MASTECTOMY WITH SENTINEL LYMPH NODE BIOPSY
Anesthesia: General | Laterality: Left

## 2018-01-27 MED ORDER — ATENOLOL 50 MG PO TABS
50.0000 mg | ORAL_TABLET | ORAL | Status: DC
  Filled 2018-01-27: qty 1

## 2018-01-27 MED ORDER — MORPHINE SULFATE (PF) 4 MG/ML IV SOLN: 2.0000 mg | INTRAVENOUS | Status: DC | PRN

## 2018-01-27 MED ORDER — METHYLENE BLUE 0.5 % INJ SOLN
INTRAVENOUS | Status: AC
  Filled 2018-01-27: qty 10

## 2018-01-27 MED ORDER — OXYCODONE HCL 5 MG PO TABS: 5.0000 mg | ORAL_TABLET | Freq: Once | ORAL | Status: DC | PRN

## 2018-01-27 MED ORDER — NEOMYCIN-POLYMYXIN B GU 40-200000 IR SOLN
Status: AC
  Filled 2018-01-27: qty 20

## 2018-01-27 MED ORDER — OXYCODONE HCL 5 MG/5ML PO SOLN: 5.0000 mg | Freq: Once | ORAL | Status: DC | PRN

## 2018-01-27 MED ORDER — METHYLENE BLUE 0.5 % INJ SOLN
INTRAVENOUS | Status: DC | PRN
  Administered 2018-01-27: 5 mL via SUBMUCOSAL

## 2018-01-27 MED ORDER — CITALOPRAM HYDROBROMIDE 20 MG PO TABS
20.0000 mg | ORAL_TABLET | Freq: Every day | ORAL | Status: DC
  Administered 2018-01-27: 20 mg via ORAL
  Filled 2018-01-27: qty 1

## 2018-01-27 MED ORDER — MIDAZOLAM HCL 2 MG/2ML IJ SOLN
INTRAMUSCULAR | Status: AC
  Filled 2018-01-27: qty 2

## 2018-01-27 MED ORDER — PROCHLORPERAZINE EDISYLATE 10 MG/2ML IJ SOLN
5.0000 mg | Freq: Four times a day (QID) | INTRAMUSCULAR | Status: DC | PRN
  Filled 2018-01-27: qty 2

## 2018-01-27 MED ORDER — KETOROLAC TROMETHAMINE 15 MG/ML IJ SOLN
INTRAMUSCULAR | Status: AC
  Administered 2018-01-27: 15 mg via INTRAVENOUS
  Filled 2018-01-27: qty 1

## 2018-01-27 MED ORDER — FENTANYL CITRATE (PF) 100 MCG/2ML IJ SOLN
INTRAMUSCULAR | Status: AC
  Administered 2018-01-27: 25 ug via INTRAVENOUS
  Filled 2018-01-27: qty 2

## 2018-01-27 MED ORDER — ONDANSETRON HCL 4 MG/2ML IJ SOLN
INTRAMUSCULAR | Status: AC
  Filled 2018-01-27: qty 2

## 2018-01-27 MED ORDER — LIDOCAINE HCL (PF) 2 % IJ SOLN
INTRAMUSCULAR | Status: AC
  Filled 2018-01-27: qty 10

## 2018-01-27 MED ORDER — KCL IN DEXTROSE-NACL 20-5-0.45 MEQ/L-%-% IV SOLN
INTRAVENOUS | Status: DC
  Administered 2018-01-27: 23:00:00 via INTRAVENOUS
  Filled 2018-01-27 (×5): qty 1000

## 2018-01-27 MED ORDER — PROCHLORPERAZINE MALEATE 10 MG PO TABS
10.0000 mg | ORAL_TABLET | Freq: Four times a day (QID) | ORAL | Status: DC | PRN
  Filled 2018-01-27: qty 1

## 2018-01-27 MED ORDER — SCOPOLAMINE 1 MG/3DAYS TD PT72
MEDICATED_PATCH | TRANSDERMAL | Status: AC
  Administered 2018-01-27: 1.5 mg
  Filled 2018-01-27: qty 1

## 2018-01-27 MED ORDER — PHENYLEPHRINE HCL 10 MG/ML IJ SOLN
INTRAMUSCULAR | Status: DC | PRN
  Administered 2018-01-27: 100 ug via INTRAVENOUS
  Administered 2018-01-27: 150 ug via INTRAVENOUS
  Administered 2018-01-27 (×3): 100 ug via INTRAVENOUS

## 2018-01-27 MED ORDER — MORPHINE SULFATE (PF) 2 MG/ML IV SOLN: 2.0000 mg | INTRAVENOUS | Status: DC | PRN

## 2018-01-27 MED ORDER — LIDOCAINE HCL (CARDIAC) PF 100 MG/5ML IV SOSY
PREFILLED_SYRINGE | INTRAVENOUS | Status: DC | PRN
  Administered 2018-01-27: 100 mg via INTRAVENOUS

## 2018-01-27 MED ORDER — EPHEDRINE SULFATE 50 MG/ML IJ SOLN
INTRAMUSCULAR | Status: AC
  Filled 2018-01-27: qty 1

## 2018-01-27 MED ORDER — CEFAZOLIN SODIUM-DEXTROSE 2-4 GM/100ML-% IV SOLN
INTRAVENOUS | Status: AC
  Filled 2018-01-27: qty 100

## 2018-01-27 MED ORDER — FENTANYL CITRATE (PF) 100 MCG/2ML IJ SOLN
INTRAMUSCULAR | Status: AC
  Administered 2018-01-27: 50 ug via INTRAVENOUS
  Filled 2018-01-27: qty 2

## 2018-01-27 MED ORDER — ONDANSETRON HCL 4 MG PO TABS: 4.0000 mg | ORAL_TABLET | Freq: Every day | ORAL | Status: DC | PRN

## 2018-01-27 MED ORDER — FENTANYL CITRATE (PF) 100 MCG/2ML IJ SOLN
INTRAMUSCULAR | Status: DC | PRN
  Administered 2018-01-27 (×2): 25 ug via INTRAVENOUS
  Administered 2018-01-27: 50 ug via INTRAVENOUS
  Administered 2018-01-27: 25 ug via INTRAVENOUS
  Administered 2018-01-27: 50 ug via INTRAVENOUS
  Administered 2018-01-27 (×3): 25 ug via INTRAVENOUS

## 2018-01-27 MED ORDER — KETOROLAC TROMETHAMINE 15 MG/ML IJ SOLN
15.0000 mg | Freq: Once | INTRAMUSCULAR | Status: AC
  Administered 2018-01-27: 15 mg via INTRAVENOUS

## 2018-01-27 MED ORDER — CIPROFLOXACIN IN D5W 400 MG/200ML IV SOLN
400.0000 mg | Freq: Two times a day (BID) | INTRAVENOUS | Status: DC
  Administered 2018-01-28: 400 mg via INTRAVENOUS
  Filled 2018-01-27 (×4): qty 200

## 2018-01-27 MED ORDER — SENNA 8.6 MG PO TABS
1.0000 | ORAL_TABLET | Freq: Two times a day (BID) | ORAL | Status: DC
  Administered 2018-01-28: 8.6 mg via ORAL
  Filled 2018-01-27 (×2): qty 1

## 2018-01-27 MED ORDER — EPHEDRINE SULFATE 50 MG/ML IJ SOLN
INTRAMUSCULAR | Status: DC | PRN
  Administered 2018-01-27: 5 mg via INTRAVENOUS

## 2018-01-27 MED ORDER — DIPHENHYDRAMINE HCL 12.5 MG/5ML PO ELIX
12.5000 mg | ORAL_SOLUTION | Freq: Four times a day (QID) | ORAL | Status: DC | PRN
  Filled 2018-01-27: qty 5

## 2018-01-27 MED ORDER — IBUPROFEN 400 MG PO TABS: 400.0000 mg | ORAL_TABLET | ORAL | Status: DC | PRN

## 2018-01-27 MED ORDER — CEFAZOLIN SODIUM-DEXTROSE 2-4 GM/100ML-% IV SOLN
2.0000 g | Freq: Three times a day (TID) | INTRAVENOUS | Status: DC
  Administered 2018-01-27 – 2018-01-28 (×2): 2 g via INTRAVENOUS
  Filled 2018-01-27 (×6): qty 100

## 2018-01-27 MED ORDER — SODIUM CHLORIDE 0.9 % IV SOLN
INTRAVENOUS | Status: DC | PRN
  Administered 2018-01-27 – 2018-01-28 (×3): 250 mL via INTRAVENOUS

## 2018-01-27 MED ORDER — PANTOPRAZOLE SODIUM 40 MG PO TBEC
40.0000 mg | DELAYED_RELEASE_TABLET | Freq: Two times a day (BID) | ORAL | Status: DC
  Administered 2018-01-27 – 2018-01-28 (×2): 40 mg via ORAL
  Filled 2018-01-27 (×2): qty 1

## 2018-01-27 MED ORDER — TRIAMTERENE-HCTZ 37.5-25 MG PO TABS
1.0000 | ORAL_TABLET | ORAL | Status: DC
  Filled 2018-01-27 (×2): qty 1

## 2018-01-27 MED ORDER — HYDROCODONE-ACETAMINOPHEN 5-325 MG PO TABS: 1.0000 | ORAL_TABLET | ORAL | Status: DC | PRN

## 2018-01-27 MED ORDER — PROPOFOL 10 MG/ML IV BOLUS
INTRAVENOUS | Status: AC
  Filled 2018-01-27: qty 40

## 2018-01-27 MED ORDER — HYDROCODONE-ACETAMINOPHEN 5-325 MG PO TABS
1.0000 | ORAL_TABLET | ORAL | Status: DC | PRN
  Administered 2018-01-28: 1 via ORAL
  Filled 2018-01-27: qty 1

## 2018-01-27 MED ORDER — DIAZEPAM 2 MG PO TABS: 2.0000 mg | ORAL_TABLET | Freq: Three times a day (TID) | ORAL | Status: DC | PRN

## 2018-01-27 MED ORDER — DEXMEDETOMIDINE HCL 200 MCG/2ML IV SOLN
INTRAVENOUS | Status: DC | PRN
  Administered 2018-01-27 (×2): 4 ug via INTRAVENOUS
  Administered 2018-01-27: 8 ug via INTRAVENOUS

## 2018-01-27 MED ORDER — ONDANSETRON HCL 4 MG/2ML IJ SOLN
INTRAMUSCULAR | Status: DC | PRN
  Administered 2018-01-27: 4 mg via INTRAVENOUS

## 2018-01-27 MED ORDER — DIPHENHYDRAMINE HCL 50 MG/ML IJ SOLN
INTRAMUSCULAR | Status: DC | PRN
  Administered 2018-01-27: 12.5 mg via INTRAVENOUS

## 2018-01-27 MED ORDER — SCOPOLAMINE 1 MG/3DAYS TD PT72: 1.0000 | MEDICATED_PATCH | Freq: Once | TRANSDERMAL | Status: DC

## 2018-01-27 MED ORDER — ENOXAPARIN SODIUM 40 MG/0.4ML ~~LOC~~ SOLN: 40.0000 mg | SUBCUTANEOUS | Status: DC

## 2018-01-27 MED ORDER — DIPHENHYDRAMINE HCL 50 MG/ML IJ SOLN: 12.5000 mg | Freq: Four times a day (QID) | INTRAMUSCULAR | Status: DC | PRN

## 2018-01-27 MED ORDER — PROPOFOL 10 MG/ML IV BOLUS
INTRAVENOUS | Status: DC | PRN
  Administered 2018-01-27: 150 mg via INTRAVENOUS
  Administered 2018-01-27: 50 mg via INTRAVENOUS

## 2018-01-27 MED ORDER — HYDROMORPHONE HCL 1 MG/ML IJ SOLN
INTRAMUSCULAR | Status: AC
  Administered 2018-01-27: 0.25 mg via INTRAVENOUS
  Filled 2018-01-27: qty 1

## 2018-01-27 MED ORDER — MIDAZOLAM HCL 2 MG/2ML IJ SOLN
INTRAMUSCULAR | Status: DC | PRN
  Administered 2018-01-27: 2 mg via INTRAVENOUS

## 2018-01-27 MED ORDER — FENTANYL CITRATE (PF) 250 MCG/5ML IJ SOLN
INTRAMUSCULAR | Status: AC
  Filled 2018-01-27: qty 5

## 2018-01-27 MED ORDER — PROMETHAZINE HCL 25 MG/ML IJ SOLN: 6.2500 mg | INTRAMUSCULAR | Status: DC | PRN

## 2018-01-27 MED ORDER — NAPROXEN 500 MG PO TABS
500.0000 mg | ORAL_TABLET | Freq: Two times a day (BID) | ORAL | Status: DC | PRN
  Filled 2018-01-27: qty 1

## 2018-01-27 MED ORDER — DIAZEPAM 2 MG PO TABS: 2.0000 mg | ORAL_TABLET | Freq: Two times a day (BID) | ORAL | Status: DC | PRN

## 2018-01-27 MED ORDER — TECHNETIUM TC 99M SULFUR COLLOID FILTERED
0.7520 | Freq: Once | INTRAVENOUS | Status: AC | PRN
  Administered 2018-01-27: 0.752 via INTRADERMAL

## 2018-01-27 MED ORDER — FENTANYL CITRATE (PF) 100 MCG/2ML IJ SOLN
25.0000 ug | INTRAMUSCULAR | Status: AC | PRN
  Administered 2018-01-27: 25 ug via INTRAVENOUS
  Administered 2018-01-27 (×2): 50 ug via INTRAVENOUS
  Administered 2018-01-27 (×3): 25 ug via INTRAVENOUS

## 2018-01-27 MED ORDER — GABAPENTIN 300 MG PO CAPS
300.0000 mg | ORAL_CAPSULE | ORAL | Status: AC
  Administered 2018-01-27: 300 mg via ORAL

## 2018-01-27 MED ORDER — GABAPENTIN 300 MG PO CAPS
ORAL_CAPSULE | ORAL | Status: AC
  Filled 2018-01-27: qty 1

## 2018-01-27 MED ORDER — HYDROMORPHONE HCL 1 MG/ML IJ SOLN
0.2500 mg | INTRAMUSCULAR | Status: DC | PRN
  Administered 2018-01-27 (×3): 0.25 mg via INTRAVENOUS

## 2018-01-27 MED ORDER — ACETAMINOPHEN 325 MG PO TABS
650.0000 mg | ORAL_TABLET | ORAL | Status: DC | PRN
  Administered 2018-01-28: 650 mg via ORAL
  Filled 2018-01-27: qty 2

## 2018-01-27 MED ORDER — LACTATED RINGERS IV SOLN
INTRAVENOUS | Status: DC
  Administered 2018-01-27 (×2): via INTRAVENOUS

## 2018-01-27 MED ORDER — NEOMYCIN-POLYMYXIN B GU 40-200000 IR SOLN
Status: DC | PRN
  Administered 2018-01-27: 4 mL

## 2018-01-27 MED ORDER — LORATADINE 10 MG PO TABS
10.0000 mg | ORAL_TABLET | Freq: Every day | ORAL | Status: DC
  Administered 2018-01-28: 10 mg via ORAL
  Filled 2018-01-27: qty 1

## 2018-01-27 MED ORDER — DIPHENHYDRAMINE HCL 50 MG/ML IJ SOLN
INTRAMUSCULAR | Status: AC
  Filled 2018-01-27: qty 1

## 2018-01-27 MED ORDER — ALBUTEROL SULFATE (2.5 MG/3ML) 0.083% IN NEBU: 2.5000 mg | INHALATION_SOLUTION | Freq: Four times a day (QID) | RESPIRATORY_TRACT | Status: DC | PRN

## 2018-01-27 MED ORDER — PROPOFOL 500 MG/50ML IV EMUL
INTRAVENOUS | Status: DC | PRN
  Administered 2018-01-27: 20 ug/kg/min via INTRAVENOUS

## 2018-01-27 SURGICAL SUPPLY — 108 items
APPLIER CLIP 11 MED OPEN (CLIP)
APPLIER CLIP 13 LRG OPEN (CLIP)
BAG DECANTER FOR FLEXI CONT (MISCELLANEOUS) ×1 IMPLANT
BINDER BREAST LRG (GAUZE/BANDAGES/DRESSINGS) IMPLANT
BINDER BREAST MEDIUM (GAUZE/BANDAGES/DRESSINGS) IMPLANT
BINDER BREAST XLRG (GAUZE/BANDAGES/DRESSINGS) ×2 IMPLANT
BINDER BREAST XXLRG (GAUZE/BANDAGES/DRESSINGS) IMPLANT
BIOPATCH WHT 1IN DISK W/4.0 H (GAUZE/BANDAGES/DRESSINGS) ×4 IMPLANT
BLADE BOVIE TIP EXT 4 (BLADE) ×1 IMPLANT
BLADE PHOTON ILLUMINATED (MISCELLANEOUS) ×2 IMPLANT
BLADE SURG 15 STRL LF DISP TIS (BLADE) ×1 IMPLANT
BLADE SURG 15 STRL SS (BLADE) ×2
BLADE SURG 15 STRL SS SAFETY (BLADE) ×3 IMPLANT
BNDG GAUZE 4.5X4.1 6PLY STRL (MISCELLANEOUS) ×2 IMPLANT
BULB RESERV EVAC DRAIN JP 100C (MISCELLANEOUS) ×4 IMPLANT
CANISTER SUCT 1200ML W/VALVE (MISCELLANEOUS) ×4 IMPLANT
CHLORAPREP W/TINT 26ML (MISCELLANEOUS) ×7 IMPLANT
CLIP APPLIE 11 MED OPEN (CLIP) IMPLANT
CLIP APPLIE 13 LRG OPEN (CLIP) IMPLANT
CLOSURE WOUND 1/2 X4 (GAUZE/BANDAGES/DRESSINGS)
CNTNR SPEC 2.5X3XGRAD LEK (MISCELLANEOUS)
CONT SPEC 4OZ STER OR WHT (MISCELLANEOUS)
CONTAINER SPEC 2.5X3XGRAD LEK (MISCELLANEOUS) ×3 IMPLANT
COVER WAND RF STERILE (DRAPES) ×1 IMPLANT
DECANTER SPIKE VIAL GLASS SM (MISCELLANEOUS) IMPLANT
DERMABOND ADVANCED (GAUZE/BANDAGES/DRESSINGS) ×4
DERMABOND ADVANCED .7 DNX12 (GAUZE/BANDAGES/DRESSINGS) ×3 IMPLANT
DRAIN CHANNEL 19F RND (DRAIN) ×2 IMPLANT
DRAIN CHANNEL JP 15F RND 16 (MISCELLANEOUS) ×2 IMPLANT
DRAPE LAPAROTOMY TRNSV 106X77 (MISCELLANEOUS) ×3 IMPLANT
DRSG GAUZE FLUFF 36X18 (GAUZE/BANDAGES/DRESSINGS) ×1 IMPLANT
DRSG TELFA 3X8 NADH (GAUZE/BANDAGES/DRESSINGS) ×3 IMPLANT
ELECT CAUTERY BLADE 6.4 (BLADE) ×3 IMPLANT
ELECT CAUTERY BLADE TIP 2.5 (TIP) ×6
ELECT REM PT RETURN 9FT ADLT (ELECTROSURGICAL) ×3
ELECTRODE CAUTERY BLDE TIP 2.5 (TIP) ×2 IMPLANT
ELECTRODE REM PT RTRN 9FT ADLT (ELECTROSURGICAL) ×1 IMPLANT
GAUZE SPONGE 4X4 12PLY STRL (GAUZE/BANDAGES/DRESSINGS) ×1 IMPLANT
GLOVE BIO SURGEON STRL SZ 6.5 (GLOVE) ×4 IMPLANT
GLOVE BIO SURGEON STRL SZ7.5 (GLOVE) ×1 IMPLANT
GLOVE BIO SURGEONS STRL SZ 6.5 (GLOVE)
GLOVE BIOGEL PI IND STRL 6.5 (GLOVE) IMPLANT
GLOVE BIOGEL PI IND STRL 7.5 (GLOVE) IMPLANT
GLOVE BIOGEL PI INDICATOR 6.5 (GLOVE) ×4
GLOVE BIOGEL PI INDICATOR 7.5 (GLOVE) ×2
GLOVE INDICATOR 8.0 STRL GRN (GLOVE) ×1 IMPLANT
GLOVE PROTEXIS LATEX SZ 7.5 (GLOVE) ×3 IMPLANT
GLOVE SKINSENSE NS SZ6.5 (GLOVE) ×2
GLOVE SKINSENSE NS SZ7.0 (GLOVE) ×2
GLOVE SKINSENSE NS SZ7.5 (GLOVE) ×2
GLOVE SKINSENSE STRL SZ6.5 (GLOVE) IMPLANT
GLOVE SKINSENSE STRL SZ7.0 (GLOVE) IMPLANT
GLOVE SKINSENSE STRL SZ7.5 (GLOVE) IMPLANT
GLOVE SURG LATEX 7.5 PF (GLOVE) IMPLANT
GOWN STRL REUS W/ TWL LRG LVL3 (GOWN DISPOSABLE) ×6 IMPLANT
GOWN STRL REUS W/TWL LRG LVL3 (GOWN DISPOSABLE) ×8
GRAFT FLEX HD 4X16 THICK (Tissue Mesh) ×4 IMPLANT
IMPL EXPANDER BREAST 455CC (Breast) IMPLANT
IMPLANT BREAST 455CC (Breast) ×2 IMPLANT
IMPLANT EXPANDER BREAST 455CC (Breast) ×1 IMPLANT
IV NS 1000ML (IV SOLUTION) ×2
IV NS 1000ML BAXH (IV SOLUTION) ×1 IMPLANT
IV NS 500ML (IV SOLUTION) ×2
IV NS 500ML BAXH (IV SOLUTION) ×1 IMPLANT
LABEL OR SOLS (LABEL) ×3 IMPLANT
NDL 21 GA WING INFUSION (NEEDLE) ×1 IMPLANT
NDL HYPO 25X1 1.5 SAFETY (NEEDLE) IMPLANT
NEEDLE 21 GA WING INFUSION (NEEDLE) ×3 IMPLANT
NEEDLE HYPO 25X1 1.5 SAFETY (NEEDLE) ×3 IMPLANT
PACK BASIN MAJOR ARMC (MISCELLANEOUS) ×3 IMPLANT
PACK BASIN MINOR ARMC (MISCELLANEOUS) ×3 IMPLANT
PACK UNIVERSAL (MISCELLANEOUS) ×1 IMPLANT
PAD ABD DERMACEA PRESS 5X9 (GAUZE/BANDAGES/DRESSINGS) ×8 IMPLANT
PAD DRESSING TELFA 3X8 NADH (GAUZE/BANDAGES/DRESSINGS) ×1 IMPLANT
PIN SAFETY STRL (MISCELLANEOUS) ×4 IMPLANT
RETRACTOR RING XSMALL (MISCELLANEOUS) ×1 IMPLANT
RTRCTR WOUND ALEXIS 13CM XS SH (MISCELLANEOUS)
SET ASEPTIC TRANSFER (MISCELLANEOUS) ×5 IMPLANT
SHEARS FOC LG CVD HARMONIC 17C (MISCELLANEOUS) IMPLANT
SLEVE PROBE SENORX GAMMA FIND (MISCELLANEOUS) ×3 IMPLANT
SPONGE LAP 18X18 RF (DISPOSABLE) ×12 IMPLANT
STRIP CLOSURE SKIN 1/2X4 (GAUZE/BANDAGES/DRESSINGS) ×2 IMPLANT
SUT ETHILON 3-0 FS-10 30 BLK (SUTURE)
SUT MNCRL 3-0 UNDYED SH (SUTURE) ×2 IMPLANT
SUT MNCRL AB 4-0 PS2 18 (SUTURE) ×6 IMPLANT
SUT MNCRL+ 5-0 UNDYED PC-3 (SUTURE) ×3 IMPLANT
SUT MONOCRYL 3-0 UNDYED (SUTURE) ×2
SUT MONOCRYL 5-0 (SUTURE) ×2
SUT PDS AB 1 CT1 27 (SUTURE) ×3 IMPLANT
SUT PDS AB 2-0 CT1 27 (SUTURE) ×6 IMPLANT
SUT PDS PLUS 2 (SUTURE)
SUT PDS PLUS AB 2-0 CT-1 (SUTURE) ×6 IMPLANT
SUT SILK 2 0 (SUTURE) ×2
SUT SILK 2-0 30XBRD TIE 12 (SUTURE) ×1 IMPLANT
SUT SILK 3 0 (SUTURE)
SUT SILK 3-0 18XBRD TIE 12 (SUTURE) ×1 IMPLANT
SUT SILK 4 0 SH (SUTURE) ×4 IMPLANT
SUT VIC AB 2-0 CT1 27 (SUTURE) ×2
SUT VIC AB 2-0 CT1 TAPERPNT 27 (SUTURE) ×4 IMPLANT
SUT VIC AB 3-0 SH 27 (SUTURE) ×2
SUT VIC AB 3-0 SH 27X BRD (SUTURE) ×2 IMPLANT
SUT VICRYL+ 3-0 144IN (SUTURE) ×3 IMPLANT
SUTURE EHLN 3-0 FS-10 30 BLK (SUTURE) ×1 IMPLANT
SWABSTK COMLB BENZOIN TINCTURE (MISCELLANEOUS) ×1 IMPLANT
SYR BULB IRRIG 60ML STRL (SYRINGE) ×3 IMPLANT
SYR CONTROL 10ML (SYRINGE) IMPLANT
TAPE TRANSPORE STRL 2 31045 (GAUZE/BANDAGES/DRESSINGS) ×3 IMPLANT
TOWEL OR 17X26 4PK STRL BLUE (TOWEL DISPOSABLE) ×3 IMPLANT

## 2018-01-27 NOTE — Interval H&P Note (Signed)
History and Physical Interval Note:  01/27/2018 10:59 AM  Mariah Hogan  has presented today for surgery, with the diagnosis of LEFT BREAST CANCER  The various methods of treatment have been discussed with the patient and family. After consideration of risks, benefits and other options for treatment, the patient has consented to  Procedure(s): MASTECTOMY WITH SENTINEL LYMPH NODE BIOPSY (Left) BREAST RECONSTRUCTION WITH PLACEMENT OF TISSUE EXPANDER AND FLEX HD (ACELLULAR HYDRATED DERMIS) (Left) as a surgical intervention .  The patient's history has been reviewed, patient examined, no change in status, stable for surgery.  I have reviewed the patient's chart and labs.  Questions were answered to the patient's satisfaction.     Loel Lofty Dillingham

## 2018-01-27 NOTE — Op Note (Signed)
Preoperative diagnosis: Left breast cancer.  Postoperative diagnosis: Same.  Operative procedure: Left simple mastectomy with sentinel node biopsy.  Operating Surgeon: Hervey Ard, MD.  Assistant: Arvilla Meres, RNFA  Anesthesia: General endotracheal by LMA.  Estimated blood loss: Less than 30 cc.  Clinical note: This 67 year old woman is undergoing neoadjuvant chemotherapy for adenocarcinoma of the left breast.  Preoperative imaging was negative for axillary adenopathy prior to treatment.  She is brought to the operative at this time for planned mastectomy and immediate reconstruction.  The second procedure will be dictated by Dr. Marla Roe  The patient had SCD stockings for DVT prevention.  She received Kefzol prior to the procedure.  Operative note: The patient had not been injected with technetium sulfur colloid prior to presenting to the operating theater.  After the induction of general anesthesia the area of the areola was cleansed with alcohol and 5 cc of 0.5% methylene blue was instilled.  The breast chest and axilla was then cleansed with ChloraPrep and draped.  An elliptical incision was used and encompassed to margin about 5 mm above and below the edge of the areola.  The skin was incised sharply and the remaining dissection completed with electrocautery.  Flaps were elevated to the sternum medially, 1 fingerbreadth below the clavicle superiorly, the inframammary fold and then the serratus fascia laterally.  Hemostasis was with 3-0 Vicryl ties and electrocautery.  The breast was elevated off the underlying pectoralis muscle taking the fascia of that muscle with the specimen.  The node seeker device was used and identified to nodes first with counts of 7000, dissection with counts of 500.  The first was both blue and "hot".  The second was "hot" only.  These were sent in formalin for routine histology.  Palpation to the axilla was otherwise unremarkable.  The smaller note correlated  with the preoperative ultrasound completed within the last few weeks showing an 8 mm node up near the apex of the axilla.  The wound was irrigated and good hemostasis noted.  At this point Dr.Dillingham was present and proceeded with the tissue expander implantation.  This procedure will be dictated by her.  Patient subsequently delivered to the recovery room in good condition.

## 2018-01-27 NOTE — Addendum Note (Signed)
Addendum  created 01/27/18 1706 by Durenda Hurt, MD   Order list changed

## 2018-01-27 NOTE — Transfer of Care (Signed)
Immediate Anesthesia Transfer of Care Note  Patient: Mariah Hogan  Procedure(s) Performed: MASTECTOMY WITH SENTINEL LYMPH NODE BIOPSY (Left ) BREAST RECONSTRUCTION WITH PLACEMENT OF TISSUE EXPANDER AND FLEX HD (ACELLULAR HYDRATED DERMIS) (Left )  Patient Location: PACU  Anesthesia Type:General  Level of Consciousness: drowsy  Airway & Oxygen Therapy: Patient Spontanous Breathing and Patient connected to face mask  Post-op Assessment: Report given to RN and Post -op Vital signs reviewed and stable  Post vital signs: stable  Last Vitals:  Vitals Value Taken Time  BP 161/83 01/27/2018  2:20 PM  Temp 37.6 C 01/27/2018  2:20 PM  Pulse 84 01/27/2018  2:28 PM  Resp 17 01/27/2018  2:28 PM  SpO2 99 % 01/27/2018  2:28 PM  Vitals shown include unvalidated device data.  Last Pain:  Vitals:   01/27/18 1420  TempSrc:   PainSc: Asleep         Complications: No apparent anesthesia complications

## 2018-01-27 NOTE — Anesthesia Post-op Follow-up Note (Signed)
Anesthesia QCDR form completed.        

## 2018-01-27 NOTE — Anesthesia Postprocedure Evaluation (Signed)
Anesthesia Post Note  Patient: Mariah Hogan  Procedure(s) Performed: MASTECTOMY WITH SENTINEL LYMPH NODE BIOPSY (Left ) BREAST RECONSTRUCTION WITH PLACEMENT OF TISSUE EXPANDER AND FLEX HD (ACELLULAR HYDRATED DERMIS) (Left )  Patient location during evaluation: PACU Anesthesia Type: General Level of consciousness: awake and alert Pain management: pain level controlled Vital Signs Assessment: post-procedure vital signs reviewed and stable Respiratory status: spontaneous breathing, nonlabored ventilation, respiratory function stable and patient connected to nasal cannula oxygen Cardiovascular status: blood pressure returned to baseline and stable Postop Assessment: no apparent nausea or vomiting Anesthetic complications: no     Last Vitals:  Vitals:   01/27/18 1550 01/27/18 1559  BP: (!) 148/79   Pulse: 81 75  Resp: 14 11  Temp:    SpO2: 96% 96%    Last Pain:  Vitals:   01/27/18 1559  TempSrc:   PainSc: 9                  [name]

## 2018-01-27 NOTE — Discharge Instructions (Signed)
INSTRUCTIONS FOR AFTER BREAST SURGERY   You are getting ready to undergo breast surgery.  You will likely have some questions about what to expect following your operation.  The following information will help you and your family understand what to expect when you are discharged from the hospital.  Following these guidelines will help ensure a smooth recovery and reduce risks of complications.   Postoperative instructions include information on: diet, wound care, medications and physical activity.  AFTER SURGERY Expect to go home after the procedure.  In some cases, you may need to spend one night in the hospital for observation.  DIET Breast surgery does not require a specific diet.  However, I have to mention that the healthier you eat the better your body can start healing. It is important to increasing your protein intake.  This means limiting the foods with sugar and carbohydrates.  Focus on vegetables and some meat.  If you have any liposuction during your procedure be sure to drink water.  If your urine is bright yellow, then it is concentrated, and you need to drink more water.  As a general rule after surgery, you should have 8 ounces of water every hour while awake.  If you find you are persistently nauseated or unable to take in liquids let us know.  NO TOBACCO USE or EXPOSURE.  This will slow your healing process and increase the risk of a wound.  WOUND CARE You can shower the day after surgery if you don't have a drain.  Use fragrance free soap.  Dial, [place] and [place] are usually mild on the skin. If you have a drain clean with baby wipes until the drain is removed.  If you have steri-strips / tape directly attached to your skin leave them in place. It is OK to get these wet.  No baths, pools or hot tubs for two weeks. We close your incision to leave the smallest and best-looking scar. No ointment or creams on your incisions until given the go ahead.  Especially not Neosporin (Too many skin  reactions with this one).  A few weeks after surgery you can use Mederma and start massaging the scar. We ask you to wear your binder or sports bra for the first 6 weeks around the clock, including while sleeping. This provides added comfort and helps reduce the fluid accumulation at the surgery site.  ACTIVITY No heavy lifting until cleared by the doctor.  This usually means no more than a half-gallon of milk.  It is OK to walk and climb stairs. In fact, moving your legs is very important to decrease your risk of a blood clot.  It will also help keep you from getting deconditioned.  Every 1 to 2 hours get up and walk for 5 minutes. This will help with a quicker recovery back to normal.  Let pain be your guide so you don't do too much.  NO, you cannot do the spring cleaning and don't plan on taking care of anyone else.  This is your time for TLC.  You will be more comfortable if you sleep and rest with your head elevated either with a few pillows under you or in a recliner.  No stomach sleeping for a few months.  WORK Everyone returns to work at different times. As a rough guide, most people take at least 1 - 2 weeks off prior to returning to work. If you need documentation for your job, bring the forms to your postoperative follow  up visit.  DRIVING Arrange for someone to bring you home from the hospital.  You may be able to drive a few days after surgery but not while taking any narcotics or valium.  BOWEL MOVEMENTS Constipation can occur after anesthesia and while taking pain medication.  It is important to stay ahead for your comfort.  We recommend taking Milk of Magnesia (2 tablespoons; twice a day) while taking the pain pills.  SEROMA This is fluid your body tried to put in the surgical site.  This is normal but if it creates tight skinny skin let us know.  It usually decreases in a few weeks.  WHEN TO CALL Call your surgeon's office if any of the following occur:  Fever 101 degrees F or  greater  Excessive bleeding or fluid from the incision site.  Pain that increases over time without aid from the medications  Redness, warmth, or pus draining from incision sites  Persistent nausea or inability to take in liquids  Severe misshapen area that underwent the operation.  Here are some resources:  1. Plastic surgery website: https://www.plasticsurgery.org/for-medical-professionals/education-and-resources/publications/breast-reconstruction-magazine 2. Breast Reconstruction Awareness Campaign:  HotelLives[employer]co[employer]nz 3. Plastic surgery Implant information:  https://www.plasticsurgery.org/patient-safety/breast-implant-safety

## 2018-01-27 NOTE — Anesthesia Procedure Notes (Signed)
Procedure Name: LMA Insertion Date/Time: 01/27/2018 12:04 PM Performed by: Lavone Orn, CRNA Pre-anesthesia Checklist: Patient identified, Emergency Drugs available, Suction available, Patient being monitored and Timeout performed Patient Re-evaluated:Patient Re-evaluated prior to induction Oxygen Delivery Method: Circle system utilized Preoxygenation: Pre-oxygenation with 100% oxygen Induction Type: IV induction Ventilation: Mask ventilation without difficulty LMA: LMA inserted LMA Size: 4.0 Number of attempts: 1 Placement Confirmation: positive ETCO2 and breath sounds checked- equal and bilateral Tube secured with: Tape Dental Injury: Teeth and Oropharynx as per pre-operative assessment

## 2018-01-27 NOTE — Op Note (Signed)
Op report    DATE OF OPERATION:  01/27/2018  LOCATION: [facility]  SURGICAL DIVISION: Plastic Surgery  PREOPERATIVE DIAGNOSES:  1. Left Breast cancer.    POSTOPERATIVE DIAGNOSES:  1. Left Breast cancer.   PROCEDURE:  1. Left immediate breast reconstruction with placement of Acellular Dermal Matrix and tissue expanders.  SURGEON: Claire Sanger Dillingham, DO  ANESTHESIA:  General.   COMPLICATIONS: None.   IMPLANTS: Left - Mentor 455 cc. Ref #[id number].  Serial Number [id number], 100 cc of injectable saline placed in the expander. Acellular Dermal Matrix 4 x 16 cm  INDICATIONS FOR PROCEDURE:  The patient, Mariah Hogan, is a 67 y.o. female born on 15-Aug-1950, is here for  immediate first stage breast reconstruction with placement of left tissue expander and Acellular dermal matrix. MRN: [id number]  CONSENT:  Informed consent was obtained directly from the patient. Risks, benefits and alternatives were fully discussed. Specific risks including but not limited to bleeding, infection, hematoma, seroma, scarring, pain, implant infection, implant extrusion, capsular contracture, asymmetry, wound healing problems, and need for further surgery were all discussed. The patient did have an ample opportunity to have her questions answered to her satisfaction.   DESCRIPTION OF PROCEDURE:  The patient was taken to the operating room by the general surgery team. SCDs were placed and IV antibiotics were given. The patient's chest was prepped and draped in a sterile fashion. A time out was performed and the implants to be used were identified.  Left mastectomy was performed.  Once the general surgery team had completed their portion of the case the patient was rendered to the plastic and reconstructive surgery team.  Left:  The pectoralis major muscle was lifted from the chest wall with release of the lateral edge and lateral inframammary fold.  The pocket was irrigated with  antibiotic solution and hemostasis was achieved with electrocautery.  The ADM was then prepared according to the manufacture guidelines and slits placed to help with postoperative fluid management.  The ADM was then sutured to the inferior and lateral edge of the inframammary fold with 2-0 PDS starting with an interrupted stitch and then a running stitch.  The lateral portion was sutured to with interrupted sutures after the expander was placed.  The expander was prepared according to the manufacture guidelines, the air evacuated and then it was placed under the ADM and pectoralis major muscle.  The inferior and lateral tabs were used to secure the expander to the chest wall with 2-0 PDS.  The drain was placed at the inframammary fold over the ADM and secured to the skin with 4-0 Silk.    The deep layers were closed with 3-0 Monocryl followed by 4-0 Monocryl.  The skin was closed with 5-0 Monocryl and then dermabond was applied.  The ABDs and breast binder were placed.  The patient tolerated the procedure well and there were no complications.  The patient was allowed to wake from anesthesia and taken to the recovery room in satisfactory condition.

## 2018-01-27 NOTE — Anesthesia Preprocedure Evaluation (Signed)
Anesthesia Evaluation  Patient identified by MRN, date of birth, ID band Patient awake    Reviewed: Allergy & Precautions, H&P , NPO status , Patient's Chart, lab work & pertinent test results  History of Anesthesia Complications (+) PONV and history of anesthetic complications  Airway Mallampati: III  TM Distance: <3 FB Neck ROM: full    Dental  (+) Chipped   Pulmonary asthma ,           Cardiovascular hypertension, + dysrhythmias Atrial Fibrillation      Neuro/Psych negative neurological ROS  negative psych ROS   GI/Hepatic Neg liver ROS, hiatal hernia, GERD  Medicated and Controlled,  Endo/Other  negative endocrine ROS  Renal/GU Renal disease     Musculoskeletal   Abdominal   Peds  Hematology negative hematology ROS (+)   Anesthesia Other Findings Past Medical History: No date: Anemia No date: Arthritis No date: Asthma     Comment:  H/O YEARS AGO-NOW HAS COUGHING SPELLS WHICH IS WHY SHE               HAS HER ALBUTEROL INHALER 08/04/2017: Breast cancer ([place])     Comment:  left breast INVASIVE DUCTAL CARCINOMA. No date: Cancer [facility])     Comment:  [phone] basal cell carcinoma No date: Complication of anesthesia     Comment:  HARD TIME WAKING UP No date: Dysrhythmia     Comment:  TACHYCARDIA No date: GERD (gastroesophageal reflux disease) No date: History of hiatal hernia     Comment:  SMALL No date: Hyperlipidemia No date: Hypertension     Comment:  PT STATES IN 01-18-18 PREOP PHONE INTERVIEW THAT HER BP               HAS BEEN ELEVATED SINCE STARTING ON VALSARTAN- PCP               SWITCHED PT BACK TO MAXIDE ON 01-18-18 AND PT STATES THAT              SHE HAS LOST 6-10 POUNDS OF FLUID SINCE RESTARTING               MAXIDE.   No date: PONV (postoperative nausea and vomiting)  Past Surgical History: 1998: ABDOMINAL HYSTERECTOMY 2002,2008: bladder tack No date: BREAST BIOPSY; Bilateral 1990:  BREAST BIOPSY; Left 2018: BREAST BIOPSY; Left     Comment:  core bx- neg 08/04/2017: BREAST BIOPSY; Left     Comment:  left UOQ 2 oclock INVASIVE DUCTAL CARCINOMA. x3: BREAST CYST EXCISION 02/13/14: BREAST SURGERY; Left     Comment:  Fibrocystic changes, pseudo-angiomatous stromal               hyperplasia. No atypia or malignancy. x2: CARPAL TUNNEL RELEASE 2008: CHOLECYSTECTOMY 01/12/2015: COLONOSCOPY WITH PROPOFOL; N/A     Comment:  Procedure: COLONOSCOPY WITH PROPOFOL;  Surgeon: Manya Silvas, MD;  Location: [facility] ENDOSCOPY;  Service:               Endoscopy;  Laterality: N/A; 01/12/2015: ESOPHAGOGASTRODUODENOSCOPY (EGD) WITH PROPOFOL; N/A     Comment:  Procedure: ESOPHAGOGASTRODUODENOSCOPY (EGD) WITH               PROPOFOL;  Surgeon: Manya Silvas, MD;  Location: [facility]              ENDOSCOPY;  Service: Endoscopy;  Laterality: N/A; 1983: NASAL RECONSTRUCTION 10/21/2017: PORTACATH PLACEMENT; N/A  Comment:  Procedure: INSERTION PORT-A-CATH;  Surgeon: Robert Bellow, MD;  Location: [facility];  Service: General;                Laterality: N/A; 01/12/2015: SAVORY DILATION; N/A     Comment:  Procedure: SAVORY DILATION;  Surgeon: Manya Silvas,               MD;  Location: [facility] ENDOSCOPY;  Service: Endoscopy;                Laterality: N/A; 1956: TONSILLECTOMY AND ADENOIDECTOMY  BMI    Body Mass Index:  26.62 kg/m      Reproductive/Obstetrics negative OB ROS                             Anesthesia Physical Anesthesia Plan  ASA: III  Anesthesia Plan: General LMA   Post-op Pain Management:    Induction: Intravenous  PONV Risk Score and Plan: Dexamethasone, Ondansetron, Midazolam, Treatment may vary due to age or medical condition and Scopolamine patch - Pre-op  Airway Management Planned: LMA  Additional Equipment:   Intra-op Plan:   Post-operative Plan: Extubation in OR  Informed Consent: I have reviewed  the patients History and Physical, chart, labs and discussed the procedure including the risks, benefits and alternatives for the proposed anesthesia with the patient or authorized representative who has indicated his/her understanding and acceptance.   Dental Advisory Given  Plan Discussed with: Anesthesiologist, CRNA and Surgeon  Anesthesia Plan Comments: (Patient consented for risks of anesthesia including but not limited to:  - adverse reactions to medications - damage to teeth, lips or other oral mucosa - sore throat or hoarseness - Damage to heart, brain, lungs or loss of life  Patient voiced understanding.)        Anesthesia Quick Evaluation

## 2018-01-27 NOTE — H&P (Signed)
Mariah Hogan [id number] 05/15/1950     HPI: 67 year old woman status post neoadjuvant chemotherapy for mastectomy and sentinel node biopsy.  No interval change in clinical history or exam.  Medications Prior to Admission  Medication Sig Dispense Refill Last Dose  . albuterol (PROVENTIL HFA;VENTOLIN HFA) 108 (90 Base) MCG/ACT inhaler Inhale 2 puffs into the lungs every 6 (six) hours as needed for wheezing or shortness of breath. 1 Inhaler 1 01/27/2018 at 0630  . atenolol (TENORMIN) 50 MG tablet Take 50 mg by mouth every morning.    01/27/2018 at 0630  . cetirizine (ZYRTEC) 10 MG tablet Take 10 mg by mouth daily before lunch.    01/27/2018 at 0630  . citalopram (CELEXA) 20 MG tablet Take 20 mg by mouth at bedtime.    01/26/2018 at Unknown time  . diazepam (VALIUM) 2 MG tablet Take 1 tablet (2 mg total) by mouth every 8 (eight) hours as needed for muscle spasms. 30 tablet 0 Past Month at Unknown time  . HYDROcodone-acetaminophen (NORCO) 5-325 MG tablet Take 1 tablet by mouth every 6 (six) hours as needed for moderate pain. 30 tablet 0 Past Month at Unknown time  . lidocaine-prilocaine (EMLA) cream Apply to affected area once 30 g 3 Taking  . ondansetron (ZOFRAN) 4 MG tablet Take 1 tablet (4 mg total) by mouth daily as needed for nausea or vomiting. 10 tablet 1 Past Month at Unknown time  . pantoprazole (PROTONIX) 40 MG tablet Take 40 mg by mouth 2 (two) times daily.    01/27/2018 at 0630  . potassium chloride SA (K-DUR,KLOR-CON) 20 MEQ tablet Take 1 tablet (20 mEq total) by mouth 2 (two) times daily. 30 tablet 0 01/26/2018 at Unknown time  . triamterene-hydrochlorothiazide (MAXZIDE-25) 37.5-25 MG tablet Take 1 tablet by mouth every morning.   01/26/2018 at Unknown time  . valACYclovir (VALTREX) 1000 MG tablet Take 1,000 mg by mouth See admin instructions. Take 1000 mg in the morning , may take a second 1000 mg dose as needed for fever blisters    Taking  . valsartan (DIOVAN) 80 MG tablet Take 80  mg by mouth every morning.    01/26/2018 at Unknown time  . ondansetron (ZOFRAN) 8 MG tablet Take 1 tablet (8 mg total) by mouth 2 (two) times daily as needed. Start on the third day after chemotherapy. (Patient not taking: Reported on 01/12/2018) 30 tablet 1 Completed Course at Unknown time   Allergies  Allergen Reactions  . Penicillin G Shortness Of Breath, Swelling, Rash and Other (See Comments)    Has patient had a PCN reaction causing immediate rash, facial/tongue/throat swelling, SOB or lightheadedness with hypotension: Yes Has patient had a PCN reaction causing severe rash involving mucus membranes or skin necrosis: No Has patient had a PCN reaction that required hospitalization: No Has patient had a PCN reaction occurring within the last 10 years: No If all of the above answers are "NO", then may proceed with Cephalosporin use.  [name] [Docetaxel] Shortness Of Breath, Palpitations and Other (See Comments)    Back pain, Back Spasms  . Other Other (See Comments)    Dodecyl gallate: Positive patch test  Elta MD UV Clear SPF 46: Positive patch test  fragrance dodocylgallate dodocylgallate  . Parabens Other (See Comments)    Positive patch test   . Shellac Other (See Comments)    Positive patch test   . Fluocinonide Rash  . Latex Rash  . Tape Dermatitis  . Triamcinolone Rash  Past Medical History:  Diagnosis Date  . Anemia   . Arthritis   . Asthma    H/O YEARS AGO-NOW HAS COUGHING SPELLS WHICH IS WHY SHE HAS HER ALBUTEROL INHALER  . Breast cancer ([place]) 08/04/2017   left breast INVASIVE DUCTAL CARCINOMA.  . Cancer ([place])    [phone] basal cell carcinoma  . Complication of anesthesia    HARD TIME WAKING UP  . Dysrhythmia    TACHYCARDIA  . GERD (gastroesophageal reflux disease)   . History of hiatal hernia    SMALL  . Hyperlipidemia   . Hypertension    PT STATES IN 01-18-18 PREOP PHONE INTERVIEW THAT HER BP HAS BEEN ELEVATED SINCE STARTING ON VALSARTAN- PCP  SWITCHED PT BACK TO MAXIDE ON 01-18-18 AND PT STATES THAT SHE HAS LOST 6-10 POUNDS OF FLUID SINCE RESTARTING MAXIDE.    Marland Kitchen PONV (postoperative nausea and vomiting)    Past Surgical History:  Procedure Laterality Date  . ABDOMINAL HYSTERECTOMY  1998  . bladder tack  [id number]  . BREAST BIOPSY Bilateral   . BREAST BIOPSY Left 1990  . BREAST BIOPSY Left 2018   core bx- neg  . 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  Marland Kitchen BREAST CYST EXCISION  x3  . BREAST SURGERY Left 02/13/14   Fibrocystic changes, pseudo-angiomatous stromal hyperplasia. No atypia or malignancy.  . CARPAL TUNNEL RELEASE  x2  . CHOLECYSTECTOMY  2008  . COLONOSCOPY WITH PROPOFOL N/A 01/12/2015   Procedure: COLONOSCOPY WITH PROPOFOL;  Surgeon: Manya Silvas, MD;  Location[facility] ENDOSCOPY;  Service: Endoscopy;  Laterality: N/A;  . ESOPHAGOGASTRODUODENOSCOPY (EGD) WITH PROPOFOL N/A 01/12/2015   Procedure: ESOPHAGOGASTRODUODENOSCOPY (EGD) WITH PROPOFOL;  Surgeon: Manya Silvas, MD;  Location: [facility] ENDOSCOPY;  Service: Endoscopy;  Laterality: N/A;  . NASAL RECONSTRUCTION  1983  . PORTACATH PLACEMENT N/A 10/21/2017   Procedure: INSERTION PORT-A-CATH;  Surgeon: Robert Bellow, MD;  Location: [facility];  Service: General;  Laterality: N/A;  . SAVORY DILATION N/A 01/12/2015   Procedure: Azzie Almas DILATION;  Surgeon: Manya Silvas, MD;  Location: [facility] ENDOSCOPY;  Service: Endoscopy;  Laterality: N/A;  . TONSILLECTOMY AND ADENOIDECTOMY  1956   Social History   Socioeconomic History  . Marital status: Single    Spouse name: Not on file  . Number of children: Not on file  . Years of education: Not on file  . Highest education level: Not on file  Occupational History  . Not on file  Social Needs  . Financial resource strain: Not on file  . Food insecurity:    Worry: Not on file    Inability: Not on file  . Transportation needs:    Medical: Not on file    Non-medical: Not on file   Tobacco Use  . Smoking status: Never Smoker  . Smokeless tobacco: Never Used  Substance and Sexual Activity  . Alcohol use: No    Alcohol/week: 0.0 standard drinks  . Drug use: No  . Sexual activity: Not on file  Lifestyle  . Physical activity:    Days per week: Not on file    Minutes per session: Not on file  . Stress: Not on file  Relationships  . Social connections:    Talks on phone: Not on file    Gets together: Not on file    Attends religious service: Not on file    Active member of club or organization: Not on file    Attends meetings  of clubs or organizations: Not on file    Relationship status: Not on file  . Intimate partner violence:    Fear of current or ex partner: Not on file    Emotionally abused: Not on file    Physically abused: Not on file    Forced sexual activity: Not on file  Other Topics Concern  . Not on file  Social History Narrative  . Not on file   Social History   Social History Narrative  . Not on file     ROS: Negative.     PE: HEENT: Negative. Lungs: Clear. Cardio: RR.   Assessment/Plan:  Proceed with planned left mastectomy with sentinel node biopsy with immediate reconstruction. Forest Gleason Angelyna Henderson 01/27/2018

## 2018-01-28 ENCOUNTER — Encounter: Payer: Self-pay | Admitting: General Surgery

## 2018-01-28 DIAGNOSIS — C50412 Malignant neoplasm of upper-outer quadrant of left female breast: Secondary | ICD-10-CM | POA: Diagnosis not present

## 2018-01-28 LAB — BASIC METABOLIC PANEL
Anion gap: 8 (ref 5–15)
BUN: 25 mg/dL — ABNORMAL HIGH (ref 8–23)
CO2: 28 mmol/L (ref 22–32)
Calcium: 8.5 mg/dL — ABNORMAL LOW (ref 8.9–10.3)
Chloride: 100 mmol/L (ref 98–111)
Creatinine, Ser: 1.01 mg/dL — ABNORMAL HIGH (ref 0.44–1.00)
GFR calc Af Amer: >60 mL/min (ref >60)
GFR calc non Af Amer: 55 mL/min — ABNORMAL LOW (ref >60)
Glucose, Bld: 121 mg/dL — ABNORMAL HIGH (ref 70–99)
Potassium: 4.5 mmol/L (ref 3.5–5.1)
Sodium: 136 mmol/L (ref 135–145)

## 2018-01-28 LAB — CBC
HCT: 33.5 % — ABNORMAL LOW (ref 36.0–46.0)
Hemoglobin: 10.6 g/dL — ABNORMAL LOW (ref 12.0–15.0)
MCH: 31.2 pg (ref 26.0–34.0)
MCHC: 31.6 g/dL (ref 30.0–36.0)
MCV: 98.5 fL (ref 80.0–100.0)
Platelets: 160 10*3/uL (ref 150–400)
RBC: 3.4 MIL/uL — ABNORMAL LOW (ref 3.87–5.11)
RDW: 12.7 % (ref 11.5–15.5)
WBC: 6 10*3/uL (ref 4.0–10.5)
nRBC: 0 % (ref 0.0–0.2)

## 2018-01-28 NOTE — Care Management Obs Status (Signed)
[facility] NOTIFICATION   Patient Details  Name: Mariah Hogan MRN: [id number] Date of Birth: 1950-09-30   Medicare Observation Status Notification Given:  No(admitted obs less than 24 hours)    Beverly Sessions, RN 01/28/2018, 9:49 AM

## 2018-01-28 NOTE — Progress Notes (Signed)
AVSS. Good pain control when she is willing to take medication. Reviewed medication usage. Flaps: Healthy. Drain: [employer]. Lungs: Clear. Reviewed activity restrictions. Patient has D/C meds and f/u appointments.

## 2018-01-28 NOTE — Progress Notes (Signed)
01/28/2018 [place] to be D/C'd Home per MD order.  Discussed prescriptions and follow up appointments with the patient. Prescriptions given to patient, medication list explained in detail. Pt verbalized understanding.  Allergies as of 01/28/2018      Reactions   Penicillin G Shortness Of Breath, Swelling, Rash, Other (See Comments)   Has patient had a PCN reaction causing immediate rash, facial/tongue/throat swelling, SOB or lightheadedness with hypotension: Yes Has patient had a PCN reaction causing severe rash involving mucus membranes or skin necrosis: No Has patient had a PCN reaction that required hospitalization: No Has patient had a PCN reaction occurring within the last 10 years: No If all of the above answers are "NO", then may proceed with Cephalosporin use.   Taxotere [docetaxel] Shortness Of Breath, Palpitations, Other (See Comments)   Back pain, Back Spasms   Other Other (See Comments)   Dodecyl gallate: Positive patch test  Elta MD UV Clear SPF 46: Positive patch test  fragrance dodocylgallate dodocylgallate   Parabens Other (See Comments)   Positive patch test    Shellac Other (See Comments)   Positive patch test    Fluocinonide Rash   Latex Rash   Tape Dermatitis   Triamcinolone Rash      Medication List    TAKE these medications   albuterol 108 (90 Base) MCG/ACT inhaler Commonly known as:  PROVENTIL HFA;VENTOLIN HFA Inhale 2 puffs into the lungs every 6 (six) hours as needed for wheezing or shortness of breath.   atenolol 50 MG tablet Commonly known as:  TENORMIN Take 50 mg by mouth every morning.   cetirizine 10 MG tablet Commonly known as:  ZYRTEC Take 10 mg by mouth daily before lunch.   citalopram 20 MG tablet Commonly known as:  CELEXA Take 20 mg by mouth at bedtime.   diazepam 2 MG tablet Commonly known as:  VALIUM Take 1 tablet (2 mg total) by mouth every 8 (eight) hours as needed for muscle spasms.    HYDROcodone-acetaminophen 5-325 MG tablet Commonly known as:  NORCO/VICODIN Take 1 tablet by mouth every 6 (six) hours as needed for moderate pain.   lidocaine-prilocaine cream Commonly known as:  EMLA Apply to affected area once   ondansetron 8 MG tablet Commonly known as:  ZOFRAN Take 1 tablet (8 mg total) by mouth 2 (two) times daily as needed. Start on the third day after chemotherapy.   ondansetron 4 MG tablet Commonly known as:  ZOFRAN Take 1 tablet (4 mg total) by mouth daily as needed for nausea or vomiting.   pantoprazole 40 MG tablet Commonly known as:  PROTONIX Take 40 mg by mouth 2 (two) times daily.   potassium chloride SA 20 MEQ tablet Commonly known as:  K-DUR,KLOR-CON Take 1 tablet (20 mEq total) by mouth 2 (two) times daily.   triamterene-hydrochlorothiazide 37.5-25 MG tablet Commonly known as:  MAXZIDE-25 Take 1 tablet by mouth every morning.   valACYclovir 1000 MG tablet Commonly known as:  VALTREX Take 1,000 mg by mouth See admin instructions. Take 1000 mg in the morning , may take a second 1000 mg dose as needed for fever blisters   valsartan 80 MG tablet Commonly known as:  DIOVAN Take 80 mg by mouth every morning.       Vitals:   01/28/18 0431 01/28/18 0814  BP: (!) 118/44 (!) 118/53  Pulse: 67 61  Resp: 18   Temp: 98.4 F (36.9 C)   SpO2: 97%  Skin clean, dry and intact without evidence of skin break down, no evidence of skin tears noted. IV catheter discontinued intact. Site without signs and symptoms of complications. Dressing and pressure applied. Pt denies pain at this time. No complaints noted.  An After Visit Summary was printed and given to the patient. Patient escorted via [place], and D/C home via private auto.  Dola Argyle

## 2018-01-29 ENCOUNTER — Telehealth: Payer: Self-pay | Admitting: *Deleted

## 2018-01-29 NOTE — Telephone Encounter (Signed)
   We need to set her up for an appointment about 2 weeks after her surgery.  Surgery looks like it was done on 01/27/2018.  M

## 2018-01-29 NOTE — Telephone Encounter (Signed)
Patient states she has not had her port flushed since 12/08/17 and needs an appointment for it. She also does not have any follow up appointments at the [facility]

## 2018-01-31 ENCOUNTER — Other Ambulatory Visit: Payer: Self-pay | Admitting: Anatomic Pathology & Clinical Pathology

## 2018-01-31 LAB — SURGICAL PATHOLOGY

## 2018-02-01 ENCOUNTER — Telehealth: Payer: Self-pay | Admitting: General Surgery

## 2018-02-01 NOTE — Telephone Encounter (Signed)
Patient is calling and is asking if her results are in for her lymph nodes. Please call patient and advise.

## 2018-02-01 NOTE — Telephone Encounter (Signed)
The patient was notified of the results from her recently completed mastectomy and sentinel node biopsy.  There was residual tumor.  Sentinel nodes were negative.  She has a follow-up appointment tomorrow with plastic surgery and in early December with Dr. Mike Gip for medical oncology.  She reports doing well.

## 2018-02-02 ENCOUNTER — Encounter: Payer: Self-pay | Admitting: Plastic Surgery

## 2018-02-02 ENCOUNTER — Ambulatory Visit (INDEPENDENT_AMBULATORY_CARE_PROVIDER_SITE_OTHER): Payer: Medicare Other | Admitting: Plastic Surgery

## 2018-02-02 VITALS — BP 112/70 | HR 80 | Ht 66.0 in | Wt 165.0 lb

## 2018-02-02 DIAGNOSIS — Z9012 Acquired absence of left breast and nipple: Secondary | ICD-10-CM

## 2018-02-02 DIAGNOSIS — C50912 Malignant neoplasm of unspecified site of left female breast: Secondary | ICD-10-CM

## 2018-02-02 NOTE — Progress Notes (Addendum)
   Subjective:    Patient ID: Mariah Hogan, female    DOB: [birth year], 67 y.o.   MRN: [id number]  The patient is a 67 year old wf here for follow-up after her left breast mastectomy and reconstruction 1 week ago.  Her expanders in place and draining as expected.  She had a little bit of fluid collection I was able to milk out of the drain and release.  No sign of infection.  Her pain seems well controlled.  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  She is 5 feet 6 inches tall, weight 160 pounds.  Preop bra 36 C.    Review of Systems  Constitutional: Negative.   HENT: Negative.   Eyes: Negative.   Respiratory: Negative.   Gastrointestinal: Negative.   Genitourinary: Negative.   Musculoskeletal: Negative.   Skin: Negative.  Negative for color change and wound.       Objective:   Physical Exam  Constitutional: She appears well-developed and well-nourished.  HENT:  Head: Normocephalic and atraumatic.  Cardiovascular: Normal rate.  Pulmonary/Chest: Effort normal.  Neurological: She is alert.  Skin: Skin is warm.       Assessment & Plan:  Invasive ductal carcinoma of left breast (HCC)  S/P mastectomy, left We placed injectable saline in the Expander using a sterile technique: Left: 70 cc for a total of 170 / 455 cc

## 2018-02-09 ENCOUNTER — Encounter: Payer: Self-pay | Admitting: Plastic Surgery

## 2018-02-09 ENCOUNTER — Ambulatory Visit (INDEPENDENT_AMBULATORY_CARE_PROVIDER_SITE_OTHER): Payer: Medicare Other | Admitting: Plastic Surgery

## 2018-02-09 ENCOUNTER — Inpatient Hospital Stay (HOSPITAL_BASED_OUTPATIENT_CLINIC_OR_DEPARTMENT_OTHER): Payer: Medicare Other | Admitting: Hematology and Oncology

## 2018-02-09 ENCOUNTER — Encounter: Payer: Self-pay | Admitting: Hematology and Oncology

## 2018-02-09 ENCOUNTER — Inpatient Hospital Stay: Payer: Medicare Other | Attending: Hematology and Oncology

## 2018-02-09 VITALS — BP 123/78 | HR 70 | Temp 97.6°F | Resp 18 | Ht 66.0 in | Wt 159.1 lb

## 2018-02-09 VITALS — BP 118/70 | HR 70 | Resp 16 | Ht 66.0 in | Wt 158.0 lb

## 2018-02-09 DIAGNOSIS — Z170 Estrogen receptor positive status [ER+]: Secondary | ICD-10-CM | POA: Diagnosis not present

## 2018-02-09 DIAGNOSIS — Z79811 Long term (current) use of aromatase inhibitors: Secondary | ICD-10-CM

## 2018-02-09 DIAGNOSIS — Z803 Family history of malignant neoplasm of breast: Secondary | ICD-10-CM | POA: Insufficient documentation

## 2018-02-09 DIAGNOSIS — C50812 Malignant neoplasm of overlapping sites of left female breast: Secondary | ICD-10-CM | POA: Insufficient documentation

## 2018-02-09 DIAGNOSIS — Z79899 Other long term (current) drug therapy: Secondary | ICD-10-CM | POA: Insufficient documentation

## 2018-02-09 DIAGNOSIS — Z9010 Acquired absence of unspecified breast and nipple: Secondary | ICD-10-CM | POA: Insufficient documentation

## 2018-02-09 DIAGNOSIS — Z9012 Acquired absence of left breast and nipple: Secondary | ICD-10-CM | POA: Insufficient documentation

## 2018-02-09 DIAGNOSIS — Z9221 Personal history of antineoplastic chemotherapy: Secondary | ICD-10-CM | POA: Diagnosis not present

## 2018-02-09 DIAGNOSIS — C50912 Malignant neoplasm of unspecified site of left female breast: Secondary | ICD-10-CM

## 2018-02-09 DIAGNOSIS — M8580 Other specified disorders of bone density and structure, unspecified site: Secondary | ICD-10-CM | POA: Diagnosis not present

## 2018-02-09 DIAGNOSIS — R918 Other nonspecific abnormal finding of lung field: Secondary | ICD-10-CM

## 2018-02-09 DIAGNOSIS — Z7189 Other specified counseling: Secondary | ICD-10-CM

## 2018-02-09 LAB — COMPREHENSIVE METABOLIC PANEL
ALT: 15 U/L (ref 0–44)
AST: 19 U/L (ref 15–41)
Albumin: 3.9 g/dL (ref 3.5–5.0)
Alkaline Phosphatase: 109 U/L (ref 38–126)
Anion gap: 9 (ref 5–15)
BUN: 22 mg/dL (ref 8–23)
CO2: 26 mmol/L (ref 22–32)
Calcium: 9.2 mg/dL (ref 8.9–10.3)
Chloride: 103 mmol/L (ref 98–111)
Creatinine, Ser: 1.02 mg/dL — ABNORMAL HIGH (ref 0.44–1.00)
GFR calc Af Amer: >60 mL/min (ref >60)
GFR calc non Af Amer: 56 mL/min — ABNORMAL LOW (ref >60)
Glucose, Bld: 116 mg/dL — ABNORMAL HIGH (ref 70–99)
Potassium: 3.4 mmol/L — ABNORMAL LOW (ref 3.5–5.1)
Sodium: 138 mmol/L (ref 135–145)
Total Bilirubin: 0.9 mg/dL (ref 0.3–1.2)
Total Protein: 6.7 g/dL (ref 6.5–8.1)

## 2018-02-09 LAB — CBC WITH DIFFERENTIAL/PLATELET
Abs Immature Granulocytes: 0.02 10*3/uL (ref 0.00–0.07)
Basophils Absolute: 0.1 10*3/uL (ref 0.0–0.1)
Basophils Relative: 1 %
Eosinophils Absolute: 0.6 10*3/uL — ABNORMAL HIGH (ref 0.0–0.5)
Eosinophils Relative: 11 %
HCT: 37.1 % (ref 36.0–46.0)
Hemoglobin: 12.2 g/dL (ref 12.0–15.0)
Immature Granulocytes: 0 %
Lymphocytes Relative: 21 %
Lymphs Abs: 1.1 10*3/uL (ref 0.7–4.0)
MCH: 30.6 pg (ref 26.0–34.0)
MCHC: 32.9 g/dL (ref 30.0–36.0)
MCV: 93 fL (ref 80.0–100.0)
Monocytes Absolute: 0.4 10*3/uL (ref 0.1–1.0)
Monocytes Relative: 7 %
Neutro Abs: 3.2 10*3/uL (ref 1.7–7.7)
Neutrophils Relative %: 60 %
Platelets: 213 10*3/uL (ref 150–400)
RBC: 3.99 MIL/uL (ref 3.87–5.11)
RDW: 11.6 % (ref 11.5–15.5)
WBC: 5.4 10*3/uL (ref 4.0–10.5)
nRBC: 0 % (ref 0.0–0.2)

## 2018-02-09 MED ORDER — HEPARIN SOD (PORK) LOCK FLUSH 100 UNIT/ML IV SOLN
500.0000 [IU] | Freq: Once | INTRAVENOUS | Status: AC
  Administered 2018-02-09: 500 [IU] via INTRAVENOUS
  Filled 2018-02-09: qty 5

## 2018-02-09 MED ORDER — SODIUM CHLORIDE 0.9% FLUSH
10.0000 mL | INTRAVENOUS | Status: DC | PRN
  Administered 2018-02-09: 10 mL via INTRAVENOUS
  Filled 2018-02-09: qty 10

## 2018-02-09 MED ORDER — LETROZOLE 2.5 MG PO TABS: 2.5000 mg | ORAL_TABLET | Freq: Every day | ORAL | 0 refills | Status: DC

## 2018-02-09 NOTE — Patient Instructions (Signed)
Letrozole tablets What is this medicine? LETROZOLE (LET roe zole) blocks the production of estrogen. It is used to treat breast cancer. This medicine may be used for other purposes; ask your health care provider or pharmacist if you have questions. COMMON BRAND NAME(S): Femara What should I tell my health care provider before I take this medicine? They need to know if you have any of these conditions: -high cholesterol -liver disease -osteoporosis (weak bones) -an unusual or allergic reaction to letrozole, other medicines, foods, dyes, or preservatives -pregnant or trying to get pregnant -breast-feeding How should I use this medicine? Take this medicine by mouth with a glass of water. You may take it with or without food. Follow the directions on the prescription label. Take your medicine at regular intervals. Do not take your medicine more often than directed. Do not stop taking except on your doctor's advice. Talk to your pediatrician regarding the use of this medicine in children. Special care may be needed. Overdosage: If you think you have taken too much of this medicine contact a poison control center or emergency room at once. NOTE: This medicine is only for you. Do not share this medicine with others. What if I miss a dose? If you miss a dose, take it as soon as you can. If it is almost time for your next dose, take only that dose. Do not take double or extra doses. What may interact with this medicine? Do not take this medicine with any of the following medications: -estrogens, like hormone replacement therapy or birth control pills This medicine may also interact with the following medications: -dietary supplements such as androstenedione or DHEA -prasterone -tamoxifen This list may not describe all possible interactions. Give your health care provider a list of all the medicines, herbs, non-prescription drugs, or dietary supplements you use. Also tell them if you smoke, drink  alcohol, or use illegal drugs. Some items may interact with your medicine. What should I watch for while using this medicine? Tell your doctor or healthcare professional if your symptoms do not start to get better or if they get worse. Do not become pregnant while taking this medicine or for 3 weeks after stopping it. Women should inform their doctor if they wish to become pregnant or think they might be pregnant. There is a potential for serious side effects to an unborn child. Talk to your health care professional or pharmacist for more information. Do not breast-feed while taking this medicine or for 3 weeks after stopping it. This medicine may interfere with the ability to have a child. Talk with your doctor or health care professional if you are concerned about your fertility. Using this medicine for a long time may increase your risk of low bone mass. Talk to your doctor about bone health. You may get drowsy or dizzy. Do not drive, use machinery, or do anything that needs mental alertness until you know how this medicine affects you. Do not stand or sit up quickly, especially if you are an older patient. This reduces the risk of dizzy or fainting spells. You may need blood work done while you are taking this medicine. What side effects may I notice from receiving this medicine? Side effects that you should report to your doctor or health care professional as soon as possible: -allergic reactions like skin rash, itching, or hives -bone fracture -chest pain -signs and symptoms of a blood clot such as breathing problems; changes in vision; chest pain; severe, sudden headache; pain, swelling,  warmth in the leg; trouble speaking; sudden numbness or weakness of the face, arm or leg -vaginal bleeding Side effects that usually do not require medical attention (report to your doctor or health care professional if they continue or are bothersome): -bone, back, joint, or muscle  pain -dizziness -fatigue -fluid retention -headache -hot flashes, night sweats -nausea -weight gain This list may not describe all possible side effects. Call your doctor for medical advice about side effects. You may report side effects to FDA at 1-800-FDA-1088. Where should I keep my medicine? Keep out of the reach of children. Store between 15 and 30 degrees C (59 and 86 degrees F). Throw away any unused medicine after the expiration date. NOTE: This sheet is a summary. It may not cover all possible information. If you have questions about this medicine, talk to your doctor, pharmacist, or health care provider.  2018 [employer]vier/Gold Standard (2015-10-01 11:10:41)

## 2018-02-09 NOTE — Progress Notes (Signed)
   Subjective:    Patient ID: Mariah Hogan, female    DOB: [birth year], 67 y.o.   MRN: [id number]  The patient is here for a follow-up on her left breast reconstruction.  She does not have any signs of infection.  No signs of seroma.  She is increasing her activities nicely.  She has not started driving.  The drain output is minimal and less than 30 cc in the last several days.   Review of Systems  Constitutional: Negative.   HENT: Negative.   Eyes: Negative.   Respiratory: Negative.   Gastrointestinal: Negative.   Endocrine: Negative.   Genitourinary: Negative.   Musculoskeletal: Negative.   Skin: Negative.   Neurological: Negative.   Psychiatric/Behavioral: Negative.        Objective:   Physical Exam  Constitutional: She appears well-developed and well-nourished.  HENT:  Head: Normocephalic and atraumatic.  Eyes: Pupils are equal, round, and reactive to light. EOM are normal.  Cardiovascular: Normal rate.  Pulmonary/Chest: Effort normal.  Neurological: She is alert.  Psychiatric: She has a normal mood and affect. Her behavior is normal.       Assessment & Plan:  S/P mastectomy, left  Acquired absence of left breast We placed injectable saline in the Expander using a sterile technique: Left: 50 cc for a total of 220 / 455 cc The drain was removed.

## 2018-02-09 NOTE — Progress Notes (Signed)
[facility] day:  02/09/2018  Chief Complaint: Mariah Hogan is a 67 y.o. female with clinical stage IA left breast cancer who is seen for assessment following interval mastectomy and discussion regarding direction of therapy.  HPI:  The patient was last seen in the medical oncology clinic on 12/08/2017.  At that time, she was doing well overall. She remained fatigued. Cough and shortness of breath persisted, but noted to be stable. Pain and nausea were well controlled. Appetite was stable; weight up 3 pounds.  Exam was unremarkable.  WBC 6000 ([place] 4300).  Potassium low at 3.3.  Blood glucose was 194.  She received cycle #3 neoadjuvant AC.  She underwent left mastectomy with sentinel lymph node biopsy and breast reconstruction with placement of a tissue expander on 01/27/2018 with Dr. Bary Castilla and Dr. Marla Roe.  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  There was no lymphovascular invasion.  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  Margins were negative.  There were 2 negative sentinel lymph nodes.  Pathologic stage was ypT1c ypN0. Patient scheduled to see Dr. Marla Roe today for post-operative follow up.  Anticipate drain removal.   Symptomatically, she is doing well.  She voices no concerns.   Past Medical History:  Diagnosis Date  . Anemia   . Arthritis   . Asthma    H/O YEARS AGO-NOW HAS COUGHING SPELLS WHICH IS WHY SHE HAS HER ALBUTEROL INHALER  . Breast cancer ([place]) 08/04/2017   left breast INVASIVE DUCTAL CARCINOMA.  . Cancer ([place])    [phone] basal cell carcinoma  . Complication of anesthesia    HARD TIME WAKING UP  . Dysrhythmia    TACHYCARDIA  . GERD (gastroesophageal reflux disease)   . History of hiatal hernia    SMALL  . Hyperlipidemia   . Hypertension    PT STATES IN 01-18-18 PREOP PHONE INTERVIEW THAT HER BP HAS BEEN ELEVATED SINCE STARTING ON VALSARTAN- PCP SWITCHED PT BACK TO MAXIDE ON  01-18-18 AND PT STATES THAT SHE HAS LOST 6-10 POUNDS OF FLUID SINCE RESTARTING MAXIDE.    Marland Kitchen PONV (postoperative nausea and vomiting)     Past Surgical History:  Procedure Laterality Date  . ABDOMINAL HYSTERECTOMY  1998  . bladder tack  [id number]  . BREAST BIOPSY Bilateral   . BREAST BIOPSY Left 1990  . BREAST BIOPSY Left 2018   core bx- neg  . 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  Marland Kitchen BREAST CYST EXCISION  x3  . BREAST RECONSTRUCTION WITH PLACEMENT OF TISSUE EXPANDER AND FLEX HD (ACELLULAR HYDRATED DERMIS) Left 01/27/2018   Procedure: BREAST RECONSTRUCTION WITH PLACEMENT OF TISSUE EXPANDER AND FLEX HD (ACELLULAR HYDRATED DERMIS);  Surgeon: Wallace Going, DO;  Location: [facility] ORS;  Service: Plastics;  Laterality: Left;  . BREAST SURGERY Left 02/13/14   Fibrocystic changes, pseudo-angiomatous stromal hyperplasia. No atypia or malignancy.  . CARPAL TUNNEL RELEASE  x2  . CHOLECYSTECTOMY  2008  . COLONOSCOPY WITH PROPOFOL N/A 01/12/2015   Procedure: COLONOSCOPY WITH PROPOFOL;  Surgeon: Manya Silvas, MD;  Location: [facility] ENDOSCOPY;  Service: Endoscopy;  Laterality: N/A;  . ESOPHAGOGASTRODUODENOSCOPY (EGD) WITH PROPOFOL N/A 01/12/2015   Procedure: ESOPHAGOGASTRODUODENOSCOPY (EGD) WITH PROPOFOL;  Surgeon: Manya Silvas, MD;  Location: [facility] ENDOSCOPY;  Service: Endoscopy;  Laterality: N/A;  . MASTECTOMY W/ SENTINEL NODE BIOPSY Left 01/27/2018   Procedure: MASTECTOMY WITH SENTINEL LYMPH NODE BIOPSY;  Surgeon: Robert Bellow, MD;  Location: [facility] ORS;  Service: General;  Laterality: Left;  . NASAL RECONSTRUCTION  1983  . PORTACATH PLACEMENT N/A 10/21/2017   Procedure: INSERTION PORT-A-CATH;  Surgeon: Robert Bellow, MD;  Location: [facility];  Service: General;  Laterality: N/A;  . SAVORY DILATION N/A 01/12/2015   Procedure: Azzie Almas DILATION;  Surgeon: Manya Silvas, MD;  Location: [facility] ENDOSCOPY;  Service: Endoscopy;  Laterality: N/A;  .  TONSILLECTOMY AND ADENOIDECTOMY  1956    Family History  Problem Relation Age of Onset  . Breast cancer Sister 5       currently 50  . Breast cancer Paternal Grandmother 60       bilateral; deceased 79s   . Breast cancer Cousin 37       daughter of an unaffected maternal aunt  . Breast cancer Cousin 33       kidney cancer also; daughter of an unaffected maternal aunt  . Other Mother        TAH/BSO prior to menopause  . Prostate cancer Paternal Grandfather        age at dx unknown  . Breast cancer Maternal Aunt        age at dx unknown  . Breast cancer Cousin 87       daughter of an unaffected maternal aunt    Social History:  reports that she has never smoked. She has never used smokeless tobacco. She reports that she does not drink alcohol or use drugs.  Her daughter, [name], is a gastroenterology PA. She is a retired Customer service manager for 35 years. Patient denies known exposures to radiation on toxins. She is alone today.  Allergies:  Allergies  Allergen Reactions  . Penicillin G Shortness Of Breath, Swelling, Rash and Other (See Comments)    Has patient had a PCN reaction causing immediate rash, facial/tongue/throat swelling, SOB or lightheadedness with hypotension: Yes Has patient had a PCN reaction causing severe rash involving mucus membranes or skin necrosis: No Has patient had a PCN reaction that required hospitalization: No Has patient had a PCN reaction occurring within the last 10 years: No If all of the above answers are "NO", then may proceed with Cephalosporin use.  [name] [Docetaxel] Shortness Of Breath, Palpitations and Other (See Comments)    Back pain, Back Spasms  . Other Other (See Comments)    Dodecyl gallate: Positive patch test  Elta MD UV Clear SPF 46: Positive patch test  fragrance dodocylgallate dodocylgallate  . Parabens Other (See Comments)    Positive patch test   . Shellac Other (See Comments)    Positive patch test   . Fluocinonide Rash   . Latex Rash  . Tape Dermatitis  . Triamcinolone Rash    Current Medications: Current Outpatient Medications  Medication Sig Dispense Refill  . atenolol (TENORMIN) 50 MG tablet Take 50 mg by mouth every morning.     . cetirizine (ZYRTEC) 10 MG tablet Take 10 mg by mouth daily before lunch.     . citalopram (CELEXA) 20 MG tablet Take 20 mg by mouth at bedtime.     . diazepam (VALIUM) 2 MG tablet Take 1 tablet (2 mg total) by mouth every 8 (eight) hours as needed for muscle spasms. 30 tablet 0  . HYDROcodone-acetaminophen (NORCO) 5-325 MG tablet Take 1 tablet by mouth every 6 (six) hours as needed for moderate pain. 30 tablet 0  . pantoprazole (PROTONIX) 40 MG tablet Take 40 mg by mouth  2 (two) times daily.     Marland Kitchen triamterene-hydrochlorothiazide (MAXZIDE-25) 37.5-25 MG tablet Take 1 tablet by mouth every morning.    . valsartan (DIOVAN) 80 MG tablet Take 80 mg by mouth every morning.     Marland Kitchen albuterol (PROVENTIL HFA;VENTOLIN HFA) 108 (90 Base) MCG/ACT inhaler Inhale 2 puffs into the lungs every 6 (six) hours as needed for wheezing or shortness of breath. (Patient not taking: Reported on 02/09/2018) 1 Inhaler 1  . lidocaine-prilocaine (EMLA) cream Apply to affected area once (Patient not taking: Reported on 02/09/2018) 30 g 3  . ondansetron (ZOFRAN) 4 MG tablet Take 1 tablet (4 mg total) by mouth daily as needed for nausea or vomiting. (Patient not taking: Reported on 02/09/2018) 10 tablet 1  . ondansetron (ZOFRAN) 8 MG tablet Take 1 tablet (8 mg total) by mouth 2 (two) times daily as needed. Start on the third day after chemotherapy. (Patient not taking: Reported on 01/12/2018) 30 tablet 1  . potassium chloride SA (K-DUR,KLOR-CON) 20 MEQ tablet Take 1 tablet (20 mEq total) by mouth 2 (two) times daily. (Patient not taking: Reported on 02/09/2018) 30 tablet 0  . valACYclovir (VALTREX) 1000 MG tablet Take 1,000 mg by mouth See admin instructions. Take 1000 mg in the morning , may take a second 1000 mg  dose as needed for fever blisters      No current facility-administered medications for this visit.     Review of Systems  Constitutional: Positive for weight loss (5 pounds). Negative for diaphoresis, fever and malaise/fatigue.       Feels good.  HENT: Negative.  Negative for congestion, ear discharge, ear pain, nosebleeds, sinus pain and sore throat.   Eyes: Negative.  Negative for blurred vision, double vision, photophobia and pain.  Respiratory: Positive for cough (chronic). Negative for hemoptysis, sputum production, shortness of breath and wheezing.   Cardiovascular: Negative.  Negative for chest pain, orthopnea, leg swelling and PND.  Gastrointestinal: Negative.  Negative for blood in stool, constipation, diarrhea, melena, nausea and vomiting.  Genitourinary: Negative.  Negative for frequency, hematuria and urgency.       Urine leakage.  Musculoskeletal: Negative.  Negative for back pain, falls, joint pain, myalgias and neck pain.  Skin: Negative.  Negative for itching and rash.  Neurological: Negative.  Negative for dizziness, tremors, sensory change, speech change, focal weakness, weakness and headaches.  Endo/Heme/Allergies: Negative.  Does not bruise/bleed easily.  Psychiatric/Behavioral: Negative.  Negative for depression and memory loss. The patient is not nervous/anxious and does not have insomnia.   All other systems reviewed and are negative.  Performance status (ECOG): 1  Vital Signs BP 123/78 (BP Location: Right Arm, Patient Position: Sitting)   Pulse 70   Temp 97.6 F (36.4 C) (Tympanic)   Resp 18   Ht 5' 6"  (1.676 m)   Wt 159 lb 1.6 oz (72.2 kg)   SpO2 97%   BMI 25.68 kg/m   Physical Exam  Constitutional: She is oriented to person, place, and time and well-developed, well-nourished, and in no distress. No distress.  HENT:  Head: Normocephalic and atraumatic. Hair is normal (chemotherapy induced alopecia).  Mouth/Throat: Oropharynx is clear and moist. No  oropharyngeal exudate.  Shoulder length brown wig.  Eyes: Pupils are equal, round, and reactive to light. Conjunctivae and EOM are normal. No scleral icterus.  Blue eyes.  Neck: Normal range of motion. Neck supple. No JVD present.  Cardiovascular: Normal rate, regular rhythm and normal heart sounds. Exam reveals no gallop and  no friction rub.  No murmur heard. Pulmonary/Chest: Effort normal and breath sounds normal. No respiratory distress. She has no wheezes. She has no rales. Right breast exhibits no skin change (fibrocystic changes laterally). Breasts are symmetrical (s/p left mastectomy with saline implant; drain in place).  Abdominal: Soft. Bowel sounds are normal. She exhibits no distension and no mass. There is no abdominal tenderness. There is no rebound and no guarding.  Musculoskeletal: Normal range of motion. She exhibits no edema or tenderness.  Lymphadenopathy:    She has no cervical adenopathy.    She has no axillary adenopathy.       Right: No supraclavicular adenopathy present.       Left: No supraclavicular adenopathy present.  Neurological: She is alert and oriented to person, place, and time. Gait normal.  Skin: Skin is dry. No rash noted. She is not diaphoretic. No erythema. No pallor.  Psychiatric: Mood, affect and judgment normal.  Nursing note and vitals reviewed.   Infusion on 02/09/2018  Component Date Value Ref Range Status  . Sodium 02/09/2018 138  135 - 145 mmol/L Final  . Potassium 02/09/2018 3.4* 3.5 - 5.1 mmol/L Final  . Chloride 02/09/2018 103  98 - 111 mmol/L Final  . CO2 02/09/2018 26  22 - 32 mmol/L Final  . Glucose, Bld 02/09/2018 116* 70 - 99 mg/dL Final  . BUN 02/09/2018 22  8 - 23 mg/dL Final  . Creatinine, Ser 02/09/2018 1.02* 0.44 - 1.00 mg/dL Final  . Calcium 02/09/2018 9.2  8.9 - 10.3 mg/dL Final  . Total Protein 02/09/2018 6.7  6.5 - 8.1 g/dL Final  . Albumin 02/09/2018 3.9  3.5 - 5.0 g/dL Final  . AST 02/09/2018 19  15 - 41 U/L Final  .  ALT 02/09/2018 15  0 - 44 U/L Final  . Alkaline Phosphatase 02/09/2018 109  38 - 126 U/L Final  . Total Bilirubin 02/09/2018 0.9  0.3 - 1.2 mg/dL Final  . GFR calc non Af Amer 02/09/2018 56* >60 mL/min Final  . GFR calc Af Amer 02/09/2018 >60  >60 mL/min Final  . Anion gap 02/09/2018 9  5 - 15 Final   Performed at [facility], [address] [place], [place] [address]  . WBC 02/09/2018 5.4  4.0 - 10.5 K/uL Final  . RBC 02/09/2018 3.99  3.87 - 5.11 MIL/uL Final  . Hemoglobin 02/09/2018 12.2  12.0 - 15.0 g/dL Final  . HCT 02/09/2018 37.1  36.0 - 46.0 % Final  . MCV 02/09/2018 93.0  80.0 - 100.0 fL Final  . MCH 02/09/2018 30.6  26.0 - 34.0 pg Final  . MCHC 02/09/2018 32.9  30.0 - 36.0 g/dL Final  . RDW 02/09/2018 11.6  11.5 - 15.5 % Final  . Platelets 02/09/2018 213  150 - 400 K/uL Final  . nRBC 02/09/2018 0.0  0.0 - 0.2 % Final  . Neutrophils Relative % 02/09/2018 60  % Final  . Neutro Abs 02/09/2018 3.2  1.7 - 7.7 K/uL Final  . Lymphocytes Relative 02/09/2018 21  % Final  . Lymphs Abs 02/09/2018 1.1  0.7 - 4.0 K/uL Final  . Monocytes Relative 02/09/2018 7  % Final  . Monocytes Absolute 02/09/2018 0.4  0.1 - 1.0 K/uL Final  . Eosinophils Relative 02/09/2018 11  % Final  . Eosinophils Absolute 02/09/2018 0.6* 0.0 - 0.5 K/uL Final  . Basophils Relative 02/09/2018 1  % Final  . Basophils Absolute 02/09/2018 0.1  0.0 - 0.1 K/uL Final  .  Immature Granulocytes 02/09/2018 0  % Final  . Abs Immature Granulocytes 02/09/2018 0.02  0.00 - 0.07 K/uL Final   Performed at [facility], [address] [place], [place] [address]    Assessment:  ORINE GOGA is a 67 y.o. female with clinical stage IA left breast cancer s/p neoadjuvant chemotherapy followed by mastectomy,sentinel lymph node biopsy, and breast reconstruction with placement of a tissue expander on 01/27/2018.  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  There was no  lymphovascular invasion.  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  Margins were negative.  There were 2 negative sentinel lymph nodes.  Pathologic stage was ypT1c ypN0.  Original breast biopsy on 08/04/2017 revealed at least grade II invasive ductal carcinoma with DCIS.  Tumor was ER + (95%), PR+ (20%), and Her 2 neu - by FISH.  Ki67 was 80%.  CA27.29 was 17.3 on 08/06/2017.  She received neoadjuvant Femara (08/12/2017 - 08/31/2017) secondary to planned  delay in surgery (mastectomy with immediate reconstruction).  Femara was discontinued after high risk Mammaprint returned.  MammaPrint was high risk luminal-type B with a 10 year risk of recurrence untreated of 29% and 94.6% of distant metastasis free interval with chemotherapy + hormonal therapy.  Bilateral breast MRI on 08/20/2017 revealed a 2.4 cm enhancing irregular mass in the outer left breast.  There were several prominent lymph nodes with borderline thicked cortices between 3-4 mm, but normal morphology.  There was a 5 mm enhancing mass in the anterior, retroareolar region of the right breast.  RIGHT breast retroareolar biopsy on 09/02/2017 revealed no evidence of malignancy.  There was fibrocystic change and usual ductal hyperplasia.  She received 1 cycle of neoadjuvant Taxotere and Cytoxan (09/03/2017). Cycle #2 on 10/01/2017 was truncated secondary to a Taxotere infusion reaction (back pain, SOB, shaking).  She received 3 cycles of neoadjuvant AC (10/27/2017 - 12/08/2017) with Udencya support.  Chest CT on 09/23/2016 revealed a 4 mm RUL subpleural nodule.  Chest CT on 08/17/2017 revealed a small 4 mm RUL nodule unchanged in size.  Echo on 10/14/2017 revealed an EF of 55-60%,  Bone density on 08/17/2017 revealed osteopenia with a T-score of -1.5 in the left femoral neck and -0.7 in the AP spine L1-L4.  She has a significant family history of breast cancer (paternal grandmother, sister, and 3 maternal cousins).   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  She was admitted to [facility] from 09/11/2017 - 09/15/2017 with enteropathogenic E.Coli (EPEC) and associated acute renal failure (creatinine 1.87; CrCl 28.3 ml/min). She was treated with intravenous azithromycin and ceftriaxone.   Symptomatically, she is recovering well from surgery.  Exam reveals post-operative changes.  Drain is in place.  Plan: 1. Labs today:  CBC with diff, CMP, CA27.29. 2. Stage IA left breast cancer Clinically doing well. Patient s/p neoadjuvant chemotherapy. Pathology reviewed from mastectomy- ypT1c ypN0. Discuss plans for adjuvant hormonal therapy.  Potential side effects of therapy reviewed.  Restart letrozole 2.5 mg a day. Discuss plans for BCI testing at 4.5 years to assess the benefit of extended adjuvant endocrine therapy. 3. Urinary leakage:  Discuss avoidance of Premarin secondary to hormone receptor + breast cancer. 4.   Osteopenia:  Calcium and vitamin D.  Consider Prolia. 5.   RTC in 1 month for MD assessment and labs (CMP).   Honor Loh, NP  02/09/2018, 10:07 AM   I saw and  evaluated the patient, participating in the key portions of the service and reviewing pertinent diagnostic studies and records.  I reviewed the nurse practitioner's note and agree with the findings and the plan.  The assessment and plan were discussed with the patient.  Multiple questions were asked by the patient and answered.   Nolon Stalls, MD 02/09/2018,10:07 AM

## 2018-02-11 ENCOUNTER — Ambulatory Visit: Payer: Medicare Other | Admitting: General Surgery

## 2018-02-11 ENCOUNTER — Other Ambulatory Visit: Payer: Self-pay

## 2018-02-11 ENCOUNTER — Ambulatory Visit (INDEPENDENT_AMBULATORY_CARE_PROVIDER_SITE_OTHER): Payer: Medicare Other | Admitting: General Surgery

## 2018-02-11 ENCOUNTER — Encounter: Payer: Self-pay | Admitting: General Surgery

## 2018-02-11 VITALS — BP 122/78 | HR 70 | Temp 97.8°F | Resp 14 | Ht 68.0 in | Wt 158.0 lb

## 2018-02-11 DIAGNOSIS — C50912 Malignant neoplasm of unspecified site of left female breast: Secondary | ICD-10-CM

## 2018-02-11 NOTE — Patient Instructions (Addendum)
Return in six months right screening mammogram   The patient is aware to use a heating pad as needed for comfort.

## 2018-02-11 NOTE — Progress Notes (Signed)
Patient ID: Mariah Hogan, female   DOB: 06-19-1950, 67 y.o.   MRN: [id number]  Chief Complaint  Patient presents with  . Routine Post Op    HPI Mariah Hogan is a 67 y.o. female here today for her follow up left mastectomy done on 12/27/2017. Patient states she is doing well.  HPI  Past Medical History:  Diagnosis Date  . Anemia   . Arthritis   . Asthma    H/O YEARS AGO-NOW HAS COUGHING SPELLS WHICH IS WHY SHE HAS HER ALBUTEROL INHALER  . 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  MammaPrint: High risk.  . Cancer ([place])    [phone] basal cell carcinoma  . Complication of anesthesia    HARD TIME WAKING UP  . Dysrhythmia    TACHYCARDIA  . GERD (gastroesophageal reflux disease)   . History of hiatal hernia    SMALL  . Hyperlipidemia   . Hypertension    PT STATES IN 01-18-18 PREOP PHONE INTERVIEW THAT HER BP HAS BEEN ELEVATED SINCE STARTING ON VALSARTAN- PCP SWITCHED PT BACK TO MAXIDE ON 01-18-18 AND PT STATES THAT SHE HAS LOST 6-10 POUNDS OF FLUID SINCE RESTARTING MAXIDE.    Marland Kitchen PONV (postoperative nausea and vomiting)     Past Surgical History:  Procedure Laterality Date  . ABDOMINAL HYSTERECTOMY  1998  . bladder tack  0712,1975  . BREAST BIOPSY Bilateral   . BREAST BIOPSY Left 1990  . BREAST BIOPSY Left 2018   core bx- neg  . 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  Marland Kitchen BREAST CYST EXCISION  x3  . BREAST RECONSTRUCTION WITH PLACEMENT OF TISSUE EXPANDER AND FLEX HD (ACELLULAR HYDRATED DERMIS) Left 01/27/2018   Procedure: BREAST RECONSTRUCTION WITH PLACEMENT OF TISSUE EXPANDER AND FLEX HD (ACELLULAR HYDRATED DERMIS);  Surgeon: Wallace Going, DO;  Location: [facility] ORS;  Service: Plastics;  Laterality: Left;  . BREAST SURGERY Left 02/13/14   Fibrocystic changes, pseudo-angiomatous stromal hyperplasia. No atypia or malignancy.  . CARPAL  TUNNEL RELEASE  x2  . CHOLECYSTECTOMY  2008  . COLONOSCOPY WITH PROPOFOL N/A 01/12/2015   Procedure: COLONOSCOPY WITH PROPOFOL;  Surgeon: Manya Silvas, MD;  Location: [facility] ENDOSCOPY;  Service: Endoscopy;  Laterality: N/A;  . ESOPHAGOGASTRODUODENOSCOPY (EGD) WITH PROPOFOL N/A 01/12/2015   Procedure: ESOPHAGOGASTRODUODENOSCOPY (EGD) WITH PROPOFOL;  Surgeon: Manya Silvas, MD;  Location: [facility] ENDOSCOPY;  Service: Endoscopy;  Laterality: N/A;  . MASTECTOMY W/ SENTINEL NODE BIOPSY Left 01/27/2018   ypT1c ypN0;  MASTECTOMY WITH SENTINEL LYMPH NODE BIOPSY;  Surgeon: Robert Bellow, MD;  Location: [facility] ORS;  Service: General;  Laterality: Left;  . NASAL RECONSTRUCTION  1983  . PORTACATH PLACEMENT N/A 10/21/2017   Procedure: INSERTION PORT-A-CATH;  Surgeon: Robert Bellow, MD;  Location: [place] [facility];  Service: General;  Laterality: N/A;  . SAVORY DILATION N/A 01/12/2015   Procedure: Azzie Almas DILATION;  Surgeon: Manya Silvas, MD;  Location: [facility] ENDOSCOPY;  Service: Endoscopy;  Laterality: N/A;  . TONSILLECTOMY AND ADENOIDECTOMY  1956    Family History  Problem Relation Age of Onset  . Breast cancer Sister 64       currently 56  . Breast cancer Paternal Grandmother 34       bilateral; deceased 2s   . Breast cancer Cousin 88       daughter of an unaffected maternal aunt  .  Breast cancer Cousin 51       kidney cancer also; daughter of an unaffected maternal aunt  . Other Mother        TAH/BSO prior to menopause  . Prostate cancer Paternal Grandfather        age at dx unknown  . Breast cancer Maternal Aunt        age at dx unknown  . Breast cancer Cousin 7       daughter of an unaffected maternal aunt    Social History Social History   Tobacco Use  . Smoking status: Never Smoker  . Smokeless tobacco: Never Used  Substance Use Topics  . Alcohol use: No    Alcohol/week: 0.0 standard drinks  . Drug use: No    Allergies  Allergen Reactions  . Penicillin G Shortness Of  Breath, Swelling, Rash and Other (See Comments)    Has patient had a PCN reaction causing immediate rash, facial/tongue/throat swelling, SOB or lightheadedness with hypotension: Yes Has patient had a PCN reaction causing severe rash involving mucus membranes or skin necrosis: No Has patient had a PCN reaction that required hospitalization: No Has patient had a PCN reaction occurring within the last 10 years: No If all of the above answers are "NO", then may proceed with Cephalosporin use.  [name] [Docetaxel] Shortness Of Breath, Palpitations and Other (See Comments)    Back pain, Back Spasms  . Other Other (See Comments)    Dodecyl gallate: Positive patch test  Elta MD UV Clear SPF 46: Positive patch test  fragrance dodocylgallate dodocylgallate  . Parabens Other (See Comments)    Positive patch test   . Shellac Other (See Comments)    Positive patch test   . Fluocinonide Rash  . Latex Rash  . Tape Dermatitis  . Triamcinolone Rash    Current Outpatient Medications  Medication Sig Dispense Refill  . albuterol (PROVENTIL HFA;VENTOLIN HFA) 108 (90 Base) MCG/ACT inhaler Inhale 2 puffs into the lungs every 6 (six) hours as needed for wheezing or shortness of breath. 1 Inhaler 1  . atenolol (TENORMIN) 50 MG tablet Take 50 mg by mouth every morning.     . cetirizine (ZYRTEC) 10 MG tablet Take 10 mg by mouth daily before lunch.     . citalopram (CELEXA) 20 MG tablet Take 20 mg by mouth at bedtime.     Marland Kitchen letrozole (FEMARA) 2.5 MG tablet Take 1 tablet (2.5 mg total) by mouth daily. Future refill will be AFTER MD visit and lab check. 30 tablet 0  . lidocaine-prilocaine (EMLA) cream Apply to affected area once 30 g 3  . ondansetron (ZOFRAN) 4 MG tablet Take 1 tablet (4 mg total) by mouth daily as needed for nausea or vomiting. 10 tablet 1  . pantoprazole (PROTONIX) 40 MG tablet Take 40 mg by mouth 2 (two) times daily.     Marland Kitchen triamterene-hydrochlorothiazide (MAXZIDE-25) 37.5-25 MG tablet Take  1 tablet by mouth every morning.    . valACYclovir (VALTREX) 1000 MG tablet Take 1,000 mg by mouth See admin instructions. Take 1000 mg in the morning , may take a second 1000 mg dose as needed for fever blisters      No current facility-administered medications for this visit.     Review of Systems Review of Systems  Constitutional: Negative.   Respiratory: Negative.   Cardiovascular: Negative.     Blood pressure 122/78, pulse 70, temperature 97.8 F (36.6 C), temperature source Skin, resp. rate 14, height 5'  8" (1.727 m), weight 158 lb (71.7 kg), SpO2 98 %.  Physical Exam Physical Exam  Constitutional: She is oriented to person, place, and time. She appears well-developed and well-nourished.  Pulmonary/Chest:    Neurological: She is alert and oriented to person, place, and time.  Skin: Skin is warm and dry.    Data Reviewed DIAGNOSIS:  A. BREAST, LEFT; MASTECTOMY:  - INVASIVE MAMMARY CARCINOMA, NO SPECIAL TYPE, SEE SUMMARY BELOW.  - DUCTAL CARCINOMA IN SITU.  - BIOPSY SITE SCAR AND MARKER CLIP PRESENT.   B. SENTINEL LYMPH NODES #1 AND #2, LEFT AXILLA; EXCISION:  - NEGATIVE FOR MALIGNANCY, TWO LYMPH NODES (0/2).   CANCER CASE SUMMARY: INVASIVE CARCINOMA OF THE BREAST  Procedure: Total mastectomy  Specimen Laterality: Left  Tumor Size: 11 mm  Histologic Type: Invasive carcinoma of no special type  Histologic Grade (Not[place] Histologic Score)            Glandular (Acinar)/Tubular Differentiation: Score 3            Nuclear Pleomorphism: Score 3            Mitotic Rate: Score 1            Overall Grade: Grade 2  Ductal Carcinoma In Situ (DCIS): Present, high-grade   Margins:            Invasive Carcinoma Margins: Uninvolved by invasive  carcinoma            Distance from closest margin: 20 mm            Specify closest margin: Upper outer quadrant  anterior             DCIS  Margins: Uninvolved by DCIS            Distance from closest margin: At least 20 mm            Specify closest margin: Upper outer quadrant  anterior   Regional Lymph Nodes: Uninvolved by tumor cells  Number of Lymph Nodes Examined: 2  Number of Sentinel Nodes Examined: 2   Treatment Effect in the Breast: Definite response to presurgical therapy  in the invasive carcinoma  Treatment Effect in the Lymph Nodes: No lymph node metastasis and no  prominent fibrous scarring  Residual Cancer Burden (RCB) from [facility] calculator (website  below):    Primary Tumor Bed       Primary tumor bed: 11 mm x 8 mm       Overall cancer cellularity: 40%       Percentage of cancer that is in situ: 1%    Lymph nodes       Number of lymph nodes positive for metastasis: 0       Diameter of largest metastasis: 0 mm    Residual Cancer Burden: 1.75    Residual Cancer Burden Class: RCB-II   [email or web address]    (accessed by Dr Dicie Beam on 01/31/2018)   Lymphovascular Invasion: Not identified   Pathologic Stage Classification (pTNM, AJCC 8th Edition): ypT1c ypN0  (sn)   At the time of initial diagnosis the tumor measured: 1.65 x 1.69 x 1.86 cm.  Assessment    Doing well status post left mastectomy with sentinel node biopsy post neoadjuvant chemotherapy with tissue expander placement.    Plan  Return in six months with a right screening mammogram    Continued scheduled follow-ups with plastic surgery.    The patient is aware to use a heating pad as needed for  comfort.The patient is aware to call back for any questions or concerns.  The patient has been asked to discuss with Dr. Mike Gip plans for port rem oval.  HPI, Physical Exam, Assessment and Plan have been scribed under the direction and in the presence of Hervey Ard, MD.  Gaspar Cola, CMA  Forest Gleason  Philopateer Strine 02/11/2018, 7:57 PM

## 2018-02-16 ENCOUNTER — Encounter: Payer: Self-pay | Admitting: Physician Assistant

## 2018-02-16 ENCOUNTER — Ambulatory Visit (INDEPENDENT_AMBULATORY_CARE_PROVIDER_SITE_OTHER): Payer: Medicare Other | Admitting: Physician Assistant

## 2018-02-16 ENCOUNTER — Ambulatory Visit: Payer: Medicare Other | Admitting: General Surgery

## 2018-02-16 VITALS — BP 124/70 | HR 91 | Resp 16 | Ht 66.0 in | Wt 158.0 lb

## 2018-02-16 DIAGNOSIS — Z9012 Acquired absence of left breast and nipple: Secondary | ICD-10-CM

## 2018-02-16 NOTE — Progress Notes (Signed)
  Subjective:     Patient ID: Mariah Hogan, female   DOB: [birth year], 67 y.o.   MRN: [id number]  HPI A pleasant 67 year old female patient presents the clinic status post mastectomy and expander placement on 01/27/18.  Patient still reports some discomfort of the anterior thorax below her breast.  She reports she especially notices it in the evenings.  Overall patient is healing well.  No signs of seroma.  Her drain was pulled at her last OV.  Review of Systems  Constitutional: Negative.   Respiratory: Negative.   Cardiovascular: Negative.   Gastrointestinal: Negative.   Skin: Positive for wound.  Psychiatric/Behavioral: Negative.        Objective:   Physical Exam  Constitutional: She is oriented to person, place, and time. She appears well-developed and well-nourished.  Neck: Normal range of motion.  Pulmonary/Chest: Effort normal.  Abdominal: Soft.  Neurological: She is alert and oriented to person, place, and time.  Skin: Skin is warm and dry.  Incision is healing well No edema, erythema or discharge     Assessment:    s/p Left mastectomy and expander placement    Plan:     We placed injectable saline in the Expander using a sterile technique:  Left: 50 cc for a total of 250 / 455 cc Ordered PT to help with residual soreness and ROM Pt will f/u in 2 weeks

## 2018-03-02 ENCOUNTER — Encounter: Payer: Self-pay | Admitting: Plastic Surgery

## 2018-03-02 ENCOUNTER — Ambulatory Visit (INDEPENDENT_AMBULATORY_CARE_PROVIDER_SITE_OTHER): Payer: Medicare Other | Admitting: Plastic Surgery

## 2018-03-02 VITALS — BP 115/78 | HR 92 | Ht 66.5 in | Wt 161.0 lb

## 2018-03-02 DIAGNOSIS — C50912 Malignant neoplasm of unspecified site of left female breast: Secondary | ICD-10-CM

## 2018-03-02 DIAGNOSIS — Z9012 Acquired absence of left breast and nipple: Secondary | ICD-10-CM

## 2018-03-02 NOTE — Progress Notes (Signed)
   Subjective:    Patient ID: Mariah Hogan, female    DOB: 01-27-1951, 67 y.o.   MRN: [id number]  The patient is a 67 year old white female here for follow-up on her left breast reconstruction.  She has an expander in place and is doing well with expansion.  There is no sign of seroma or hematoma.  She is feeling the expander in the axillary area and I was able to identify that it is the expander.  She is also having some point tenderness at the inframammary fold.  This is where the flex HD was sutured to the rectus fascia it does appear to be getting better.  She is not having any chest pain or numbness or tingling in her arms that would be concerning for heart issue.     Review of Systems  Constitutional: Negative.   HENT: Negative.   Respiratory: Negative.   Gastrointestinal: Negative.   Genitourinary: Negative.   Skin: Negative.        Objective:   Physical Exam Vitals signs and nursing note reviewed.  Constitutional:      Appearance: Normal appearance.  HENT:     Head: Normocephalic and atraumatic.  Cardiovascular:     Rate and Rhythm: Normal rate.  Pulmonary:     Effort: Pulmonary effort is normal. No respiratory distress.     Breath sounds: No wheezing.  Skin:    General: Skin is warm.  Neurological:     Mental Status: She is alert.  Psychiatric:        Mood and Affect: Mood normal.        Thought Content: Thought content normal.        Judgment: Judgment normal.        Assessment & Plan:  Invasive ductal carcinoma of left breast (HCC)  Acquired absence of left breast  S/P mastectomy, left  We placed injectable saline in the Expander using a sterile technique: Left: 50 cc for a total of 300 / 455 cc

## 2018-03-15 ENCOUNTER — Inpatient Hospital Stay: Payer: Medicare Other | Attending: Oncology

## 2018-03-15 ENCOUNTER — Inpatient Hospital Stay: Payer: Medicare Other | Attending: Hematology and Oncology

## 2018-03-15 ENCOUNTER — Inpatient Hospital Stay (HOSPITAL_BASED_OUTPATIENT_CLINIC_OR_DEPARTMENT_OTHER): Payer: Medicare Other | Admitting: Hematology and Oncology

## 2018-03-15 ENCOUNTER — Encounter: Payer: Self-pay | Admitting: Hematology and Oncology

## 2018-03-15 VITALS — BP 124/54 | HR 85 | Temp 97.0°F | Resp 18 | Ht 66.5 in | Wt 162.4 lb

## 2018-03-15 DIAGNOSIS — C50412 Malignant neoplasm of upper-outer quadrant of left female breast: Secondary | ICD-10-CM | POA: Diagnosis present

## 2018-03-15 DIAGNOSIS — Z79899 Other long term (current) drug therapy: Secondary | ICD-10-CM

## 2018-03-15 DIAGNOSIS — Z9221 Personal history of antineoplastic chemotherapy: Secondary | ICD-10-CM | POA: Insufficient documentation

## 2018-03-15 DIAGNOSIS — R911 Solitary pulmonary nodule: Secondary | ICD-10-CM

## 2018-03-15 DIAGNOSIS — Z8042 Family history of malignant neoplasm of prostate: Secondary | ICD-10-CM | POA: Insufficient documentation

## 2018-03-15 DIAGNOSIS — N6091 Unspecified benign mammary dysplasia of right breast: Secondary | ICD-10-CM | POA: Diagnosis not present

## 2018-03-15 DIAGNOSIS — Z803 Family history of malignant neoplasm of breast: Secondary | ICD-10-CM

## 2018-03-15 DIAGNOSIS — M8580 Other specified disorders of bone density and structure, unspecified site: Secondary | ICD-10-CM | POA: Diagnosis not present

## 2018-03-15 DIAGNOSIS — Z90710 Acquired absence of both cervix and uterus: Secondary | ICD-10-CM | POA: Diagnosis not present

## 2018-03-15 DIAGNOSIS — M85852 Other specified disorders of bone density and structure, left thigh: Secondary | ICD-10-CM

## 2018-03-15 DIAGNOSIS — Z79811 Long term (current) use of aromatase inhibitors: Secondary | ICD-10-CM | POA: Diagnosis not present

## 2018-03-15 DIAGNOSIS — Z9882 Breast implant status: Secondary | ICD-10-CM | POA: Diagnosis not present

## 2018-03-15 DIAGNOSIS — Z9049 Acquired absence of other specified parts of digestive tract: Secondary | ICD-10-CM | POA: Insufficient documentation

## 2018-03-15 DIAGNOSIS — Z8051 Family history of malignant neoplasm of kidney: Secondary | ICD-10-CM | POA: Insufficient documentation

## 2018-03-15 DIAGNOSIS — Z170 Estrogen receptor positive status [ER+]: Secondary | ICD-10-CM | POA: Diagnosis not present

## 2018-03-15 DIAGNOSIS — C50912 Malignant neoplasm of unspecified site of left female breast: Secondary | ICD-10-CM

## 2018-03-15 DIAGNOSIS — Z9012 Acquired absence of left breast and nipple: Secondary | ICD-10-CM

## 2018-03-15 DIAGNOSIS — M8589 Other specified disorders of bone density and structure, multiple sites: Secondary | ICD-10-CM

## 2018-03-15 LAB — COMPREHENSIVE METABOLIC PANEL
ALT: 24 U/L (ref 0–44)
AST: 25 U/L (ref 15–41)
Albumin: 4.2 g/dL (ref 3.5–5.0)
Alkaline Phosphatase: 106 U/L (ref 38–126)
Anion gap: 10 (ref 5–15)
BUN: 22 mg/dL (ref 8–23)
CO2: 27 mmol/L (ref 22–32)
Calcium: 9.5 mg/dL (ref 8.9–10.3)
Chloride: 100 mmol/L (ref 98–111)
Creatinine, Ser: 0.93 mg/dL (ref 0.44–1.00)
GFR calc Af Amer: >60 mL/min (ref >60)
GFR calc non Af Amer: >60 mL/min (ref >60)
Glucose, Bld: 192 mg/dL — ABNORMAL HIGH (ref 70–99)
Potassium: 3.6 mmol/L (ref 3.5–5.1)
Sodium: 137 mmol/L (ref 135–145)
Total Bilirubin: 1 mg/dL (ref 0.3–1.2)
Total Protein: 7.1 g/dL (ref 6.5–8.1)

## 2018-03-15 LAB — CBC WITH DIFFERENTIAL/PLATELET
Abs Immature Granulocytes: 0.02 10*3/uL (ref 0.00–0.07)
Basophils Absolute: 0 10*3/uL (ref 0.0–0.1)
Basophils Relative: 1 %
Eosinophils Absolute: 0.3 10*3/uL (ref 0.0–0.5)
Eosinophils Relative: 6 %
HCT: 37.1 % (ref 36.0–46.0)
Hemoglobin: 12.8 g/dL (ref 12.0–15.0)
Immature Granulocytes: 0 %
Lymphocytes Relative: 26 %
Lymphs Abs: 1.3 10*3/uL (ref 0.7–4.0)
MCH: 30.7 pg (ref 26.0–34.0)
MCHC: 34.5 g/dL (ref 30.0–36.0)
MCV: 89 fL (ref 80.0–100.0)
Monocytes Absolute: 0.4 10*3/uL (ref 0.1–1.0)
Monocytes Relative: 7 %
Neutro Abs: 3 10*3/uL (ref 1.7–7.7)
Neutrophils Relative %: 60 %
Platelets: 199 10*3/uL (ref 150–400)
RBC: 4.17 MIL/uL (ref 3.87–5.11)
RDW: 11.8 % (ref 11.5–15.5)
WBC: 5 10*3/uL (ref 4.0–10.5)
nRBC: 0 % (ref 0.0–0.2)

## 2018-03-15 MED ORDER — LETROZOLE 2.5 MG PO TABS: 2.5000 mg | ORAL_TABLET | Freq: Every day | ORAL | 3 refills | Status: DC

## 2018-03-15 NOTE — Progress Notes (Signed)
Patient c/o increase pain noted at left breast site with lying down at night

## 2018-03-15 NOTE — Progress Notes (Addendum)
[facility] day:  03/15/2018  Chief Complaint: Mariah Hogan is a 68 y.o. female with clinical stage IA left breast cancer s/p mastectomy who is seen for 1 month assessment on Femara.  HPI:  The patient was last seen in the medical oncology clinic on 02/09/2018.  At that time, she was recovering well from surgery.  Exam revealed post-operative changes.  Drain was in place.  We discussed adjuvant Femara.  During the interim, patient states "I am here and I am alive".  She states that she started Femara the same day that was prescribed.  She is requesting a refill.  She notes a little nausea and queasiness in the morning.   She notes stiffness in her left fourth digit.  She has gone to see Dr. Marla Roe 3 times for saline injections into her breast implant.  She describes some pain under her left breast which can wake her up at night.   Past Medical History:  Diagnosis Date  . Anemia   . Arthritis   . Asthma    H/O YEARS AGO-NOW HAS COUGHING SPELLS WHICH IS WHY SHE HAS HER ALBUTEROL INHALER  . BRCA gene mutation negative 08/12/2017   Variant of unknown significance at [facility] only abnormality.  . Breast cancer ([place]) 08/04/2017   1.8 cm ER 90%, PR 20%, HER-2/neu negative invasive mammary carcinoma of the left upper outer quadrant.  MammaPrint: High risk.  . Cancer ([place])    [phone] basal cell carcinoma  . Complication of anesthesia    HARD TIME WAKING UP  . Dysrhythmia    TACHYCARDIA  . GERD (gastroesophageal reflux disease)   . History of hiatal hernia    SMALL  . Hyperlipidemia   . Hypertension    PT STATES IN 01-18-18 PREOP PHONE INTERVIEW THAT HER BP HAS BEEN ELEVATED SINCE STARTING ON VALSARTAN- PCP SWITCHED PT BACK TO MAXIDE ON 01-18-18 AND PT STATES THAT SHE HAS LOST 6-10 POUNDS OF FLUID SINCE RESTARTING MAXIDE.    Marland Kitchen PONV (postoperative nausea and vomiting)     Past Surgical History:  Procedure Laterality Date  . ABDOMINAL  HYSTERECTOMY  1998  . bladder tack  [id number]  . BREAST BIOPSY Bilateral   . BREAST BIOPSY Left 1990  . BREAST BIOPSY Left 2018   core bx- neg  . 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  Marland Kitchen BREAST CYST EXCISION  x3  . BREAST RECONSTRUCTION WITH PLACEMENT OF TISSUE EXPANDER AND FLEX HD (ACELLULAR HYDRATED DERMIS) Left 01/27/2018   Procedure: BREAST RECONSTRUCTION WITH PLACEMENT OF TISSUE EXPANDER AND FLEX HD (ACELLULAR HYDRATED DERMIS);  Surgeon: Wallace Going, DO;  Location: [facility] ORS;  Service: Plastics;  Laterality: Left;  . BREAST SURGERY Left 02/13/14   Fibrocystic changes, pseudo-angiomatous stromal hyperplasia. No atypia or malignancy.  . CARPAL TUNNEL RELEASE  x2  . CHOLECYSTECTOMY  2008  . COLONOSCOPY WITH PROPOFOL N/A 01/12/2015   Procedure: COLONOSCOPY WITH PROPOFOL;  Surgeon: Manya Silvas, MD;  Location: [facility] ENDOSCOPY;  Service: Endoscopy;  Laterality: N/A;  . ESOPHAGOGASTRODUODENOSCOPY (EGD) WITH PROPOFOL N/A 01/12/2015   Procedure: ESOPHAGOGASTRODUODENOSCOPY (EGD) WITH PROPOFOL;  Surgeon: Manya Silvas, MD;  Location: [facility] ENDOSCOPY;  Service: Endoscopy;  Laterality: N/A;  . MASTECTOMY W/ SENTINEL NODE BIOPSY Left 01/27/2018   ypT1c ypN0; ER 90%, PR 20%, HER-2/neu not overexpressed.  MASTECTOMY WITH SENTINEL LYMPH NODE BIOPSY;  Surgeon: Robert Bellow, MD;  Location: [facility] ORS;  Service:  General;  Laterality: Left;  . MINOR BREAST BIOPSY Right 09/02/2017   Radiology perform procedure, fibrocystic changes right retroareolar area.  Marland Kitchen NASAL RECONSTRUCTION  1983  . PORTACATH PLACEMENT N/A 10/21/2017   Procedure: INSERTION PORT-A-CATH;  Surgeon: Robert Bellow, MD;  Location: [facility] ORS;  Service: General;  Laterality: N/A;  . REMOVAL OF TISSUE EXPANDER AND PLACEMENT OF IMPLANT Left 05/13/2018   Procedure: REMOVAL OF TISSUE EXPANDER AND PLACEMENT OF IMPLANT;  Surgeon: Wallace Going, DO;  Location: [facility] ORS;  Service: Plastics;   Laterality: Left;  total surgery time should be 3 hours, per provider  . SAVORY DILATION N/A 01/12/2015   Procedure: SAVORY DILATION;  Surgeon: Manya Silvas, MD;  Location: [facility] ENDOSCOPY;  Service: Endoscopy;  Laterality: N/A;  . TONSILLECTOMY AND ADENOIDECTOMY  1956    Family History  Problem Relation Age of Onset  . Breast cancer Sister 77       currently 52  . Breast cancer Paternal Grandmother 22       bilateral; deceased 26s   . Breast cancer Cousin 31       daughter of an unaffected maternal aunt  . Breast cancer Cousin 42       kidney cancer also; daughter of an unaffected maternal aunt  . Other Mother        TAH/BSO prior to menopause  . Prostate cancer Paternal Grandfather        age at dx unknown  . Breast cancer Maternal Aunt        age at dx unknown  . Breast cancer Cousin 29       daughter of an unaffected maternal aunt    Social History:  reports that she has never smoked. She has never used smokeless tobacco. She reports that she does not drink alcohol or use drugs.  Her daughter, [name], is a gastroenterology PA. She is a retired Customer service manager for 35 years. Patient denies known exposures to radiation on toxins. She is alone today.  Allergies:  Allergies  Allergen Reactions  . Penicillin G Shortness Of Breath, Swelling, Rash and Other (See Comments)    Has patient had a PCN reaction causing immediate rash, facial/tongue/throat swelling, SOB or lightheadedness with hypotension: Yes Has patient had a PCN reaction causing severe rash involving mucus membranes or skin necrosis: No Has patient had a PCN reaction that required hospitalization: No Has patient had a PCN reaction occurring within the last 10 years: No If all of the above answers are "NO", then may proceed with Cephalosporin use.  [name] [Docetaxel] Shortness Of Breath, Palpitations and Other (See Comments)    Back pain, Back Spasms  . Other Other (See Comments)    Dodecyl gallate: Positive  patch test  Elta MD UV Clear SPF 46: Positive patch test  fragrance dodocylgallate dodocylgallate  . Parabens Other (See Comments)    Positive patch test   . Shellac Other (See Comments)    Positive patch test   . Fluocinonide Rash  . Latex Rash  . Tape Dermatitis  . Triamcinolone Rash    Current Medications: Current Outpatient Medications  Medication Sig Dispense Refill  . atenolol (TENORMIN) 50 MG tablet Take 50 mg by mouth every morning.     . cetirizine (ZYRTEC) 10 MG tablet Take 10 mg by mouth daily before lunch.     . citalopram (CELEXA) 20 MG tablet Take 20 mg by mouth at bedtime.     Marland Kitchen letrozole (FEMARA) 2.5 MG tablet Take  1 tablet (2.5 mg total) by mouth daily. Future refill will be AFTER MD visit and lab check. 90 tablet 3  . pantoprazole (PROTONIX) 40 MG tablet Take 40 mg by mouth 2 (two) times daily as needed (heartburn).     . triamterene-hydrochlorothiazide (MAXZIDE-25) 37.5-25 MG tablet Take 1 tablet by mouth every morning.    . valACYclovir (VALTREX) 1000 MG tablet Take 1,000 mg by mouth See admin instructions. Take 1000 mg in the morning , may take a second 1000 mg dose as needed for fever blisters     . albuterol (PROVENTIL HFA;VENTOLIN HFA) 108 (90 Base) MCG/ACT inhaler Inhale 2 puffs into the lungs every 6 (six) hours as needed for wheezing or shortness of breath. 1 Inhaler 1   No current facility-administered medications for this visit.     Review of Systems  Constitutional: Negative for chills, diaphoresis, fever, malaise/fatigue and weight loss (up 3 pounds).       "I'm here".  HENT: Negative.  Negative for congestion, ear discharge, ear pain, nosebleeds, sinus pain, sore throat and tinnitus.   Eyes: Negative.  Negative for blurred vision, double vision, photophobia and pain.  Respiratory: Negative for cough, hemoptysis, sputum production, shortness of breath and wheezing.   Cardiovascular: Negative.  Negative for chest pain, palpitations, orthopnea, leg  swelling and PND.  Gastrointestinal: Positive for nausea (little). Negative for abdominal pain, blood in stool, constipation, diarrhea, melena and vomiting.  Genitourinary: Negative.  Negative for dysuria, frequency, hematuria and urgency.       Urine leakage.  Musculoskeletal: Negative.  Negative for back pain, falls, joint pain, myalgias and neck pain.       Left 4th digit a little stiff.  Skin: Negative.  Negative for itching and rash.  Neurological: Negative.  Negative for dizziness, tremors, sensory change, speech change, focal weakness, weakness and headaches.  Endo/Heme/Allergies: Negative.  Does not bruise/bleed easily.  Psychiatric/Behavioral: Negative.  Negative for depression and memory loss. The patient is not nervous/anxious and does not have insomnia.   All other systems reviewed and are negative.  Performance status (ECOG): 1  Vital Signs BP (!) 124/54 (BP Location: Left Arm, Patient Position: Sitting)   Pulse 85   Temp (!) 97 F (36.1 C) (Tympanic)   Resp 18   Ht 5' 6.5" (1.689 m)   Wt 162 lb 5.9 oz (73.6 kg)   SpO2 98%   BMI 25.81 kg/m   Physical Exam  Constitutional: She is oriented to person, place, and time and well-developed, well-nourished, and in no distress. No distress.  HENT:  Head: Normocephalic and atraumatic.  Mouth/Throat: Oropharynx is clear and moist. No oropharyngeal exudate.  Long strawberry blonde wig.  Eyes: Pupils are equal, round, and reactive to light. Conjunctivae and EOM are normal. No scleral icterus.  Blue eyes.  Neck: Normal range of motion. Neck supple. No JVD present.  Cardiovascular: Normal rate, regular rhythm and normal heart sounds. Exam reveals no gallop and no friction rub.  No murmur heard. Pulmonary/Chest: Effort normal and breath sounds normal. No respiratory distress. She has no wheezes. She has no rales. Breasts are symmetrical (s/p left mastectomy with saline implant; skin tight).  Abdominal: Soft. Bowel sounds are  normal. She exhibits no distension and no mass. There is no abdominal tenderness. There is no rebound.  Musculoskeletal: Normal range of motion.        General: No tenderness or edema.  Lymphadenopathy:    She has no cervical adenopathy.    She has no  axillary adenopathy.       Right: No supraclavicular adenopathy present.       Left: No supraclavicular adenopathy present.  Neurological: She is alert and oriented to person, place, and time. Gait normal.  Skin: Skin is warm and dry. No rash noted. She is not diaphoretic. No erythema. No pallor.  Psychiatric: Mood, affect and judgment normal.  Nursing note and vitals reviewed.   Appointment on 03/15/2018  Component Date Value Ref Range Status  . Sodium 03/15/2018 137  135 - 145 mmol/L Final  . Potassium 03/15/2018 3.6  3.5 - 5.1 mmol/L Final  . Chloride 03/15/2018 100  98 - 111 mmol/L Final  . CO2 03/15/2018 27  22 - 32 mmol/L Final  . Glucose, Bld 03/15/2018 192* 70 - 99 mg/dL Final  . BUN 03/15/2018 22  8 - 23 mg/dL Final  . Creatinine, Ser 03/15/2018 0.93  0.44 - 1.00 mg/dL Final  . Calcium 03/15/2018 9.5  8.9 - 10.3 mg/dL Final  . Total Protein 03/15/2018 7.1  6.5 - 8.1 g/dL Final  . Albumin 03/15/2018 4.2  3.5 - 5.0 g/dL Final  . AST 03/15/2018 25  15 - 41 U/L Final  . ALT 03/15/2018 24  0 - 44 U/L Final  . Alkaline Phosphatase 03/15/2018 106  38 - 126 U/L Final  . Total Bilirubin 03/15/2018 1.0  0.3 - 1.2 mg/dL Final  . GFR calc non Af Amer 03/15/2018 >60  >60 mL/min Final  . GFR calc Af Amer 03/15/2018 >60  >60 mL/min Final  . Anion gap 03/15/2018 10  5 - 15 Final   Performed at [facility], [address] [place], [place] [address]  . WBC 03/15/2018 5.0  4.0 - 10.5 K/uL Final  . RBC 03/15/2018 4.17  3.87 - 5.11 MIL/uL Final  . Hemoglobin 03/15/2018 12.8  12.0 - 15.0 g/dL Final  . HCT 03/15/2018 37.1  36.0 - 46.0 % Final  . MCV 03/15/2018 89.0  80.0 - 100.0 fL Final  . MCH 03/15/2018 30.7  26.0 - 34.0 pg  Final  . MCHC 03/15/2018 34.5  30.0 - 36.0 g/dL Final  . RDW 03/15/2018 11.8  11.5 - 15.5 % Final  . Platelets 03/15/2018 199  150 - 400 K/uL Final  . nRBC 03/15/2018 0.0  0.0 - 0.2 % Final  . Neutrophils Relative % 03/15/2018 60  % Final  . Neutro Abs 03/15/2018 3.0  1.7 - 7.7 K/uL Final  . Lymphocytes Relative 03/15/2018 26  % Final  . Lymphs Abs 03/15/2018 1.3  0.7 - 4.0 K/uL Final  . Monocytes Relative 03/15/2018 7  % Final  . Monocytes Absolute 03/15/2018 0.4  0.1 - 1.0 K/uL Final  . Eosinophils Relative 03/15/2018 6  % Final  . Eosinophils Absolute 03/15/2018 0.3  0.0 - 0.5 K/uL Final  . Basophils Relative 03/15/2018 1  % Final  . Basophils Absolute 03/15/2018 0.0  0.0 - 0.1 K/uL Final  . Immature Granulocytes 03/15/2018 0  % Final  . Abs Immature Granulocytes 03/15/2018 0.02  0.00 - 0.07 K/uL Final   Performed at [facility], [address], [place], [place] [address]    Assessment:  Mariah Hogan is a 68 y.o. female with clinical stage IA left breast cancer s/p neoadjuvant chemotherapy followed by mastectomy,sentinel lymph node biopsy, and breast reconstruction with placement of a tissue expander on 01/27/2018.  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  There was no lymphovascular invasion.  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  Margins were negative.  There were 2 negative sentinel lymph nodes.  Pathologic stage was ypT1c ypN0.  Original breast biopsy on 08/04/2017 revealed at least grade II invasive ductal carcinoma with DCIS.  Tumor was ER + (95%), PR+ (20%), and Her 2 neu - by FISH.  Ki67 was 80%.  CA27.29 was 17.3 on 08/06/2017.  She received neoadjuvant Femara (08/12/2017 - 08/31/2017) secondary to planned  delay in surgery (mastectomy with immediate reconstruction).  Femara was discontinued after high risk Mammaprint returned.  She received 1 cycle of neoadjuvant Taxotere and Cytoxan (09/03/2017). Cycle #2 on 10/01/2017 was  truncated secondary to a Taxotere infusion reaction (back pain, SOB, shaking).  She received 3 cycles of neoadjuvant AC (10/27/2017 - 12/08/2017) with Udencya support.  She began Femara on 02/09/2018.  She is tolerating it well.  MammaPrint was high risk luminal-type B with a 10 year risk of recurrence untreated of 29% and 94.6% of distant metastasis free interval with chemotherapy + hormonal therapy.  Bilateral breast MRI on 08/20/2017 revealed a 2.4 cm enhancing irregular mass in the outer left breast.  There were several prominent lymph nodes with borderline thicked cortices between 3-4 mm, but normal morphology.  There was a 5 mm enhancing mass in the anterior, retroareolar region of the right breast.  RIGHT breast retroareolar biopsy on 09/02/2017 revealed no evidence of malignancy.  There was fibrocystic change and usual ductal hyperplasia.  Chest CT on 09/23/2016 revealed a 4 mm RUL subpleural nodule.  Chest CT on 08/17/2017 revealed a small 4 mm RUL nodule unchanged in size.  Echo on 10/14/2017 revealed an EF of 55-60%,  Bone density on 08/17/2017 revealed osteopenia with a T-score of -1.5 in the left femoral neck and -0.7 in the AP spine L1-L4.  She has a significant family history of breast cancer (paternal grandmother, sister, and 3 maternal cousins).   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  She was [facility] to [facility] from 09/11/2017 - 09/15/2017 with enteropathogenic E.Coli (EPEC) and associated acute renal failure (creatinine 1.87; CrCl 28.3 ml/min). She was treated with intravenous azithromycin and ceftriaxone.   Symptomatically, she is doing well.  She is tolerating Femara well.  Exam is unremarkable.  Plan: 1. Labs today:  CBC with diff, CMP. 2. Stage IA left breast cancer Patient is doing well. She is s/p neoadjuvant chemotherapy followed by mastectomy.   She has completed 1 month of letrozole  and is tolerating it well. She is following up with Dr.Dillingham of plastic surgery Continue letrozole. 3.   Osteopenia  Patient on calcium and vitamin D.  She notes a recent dental check.  Information on Prolia today.  Anticipate initiation of Prolia after clearance from dentist (Dr Delice Bison). 4.   Port-a-cath  Discuss port-a-cath removal.  Order sent for port-a-cath removal. 5.  RTC in 3 months for MD assessment, labs (CBC with differential, CMP, CA 27-29), and +/- Prolia.   Lequita Asal, MD  03/15/2018, 4:35 PM

## 2018-03-15 NOTE — Progress Notes (Unsigned)
Survivorship Care Plan visit completed.  Treatment summary reviewed and given to patient.  [facility]CO answers booklet reviewed and given to patient.  CARE program and Cancer Transitions discussed with patient along with other resources cancer center offers to patients and caregivers.  Patient verbalized understanding.    

## 2018-03-15 NOTE — Patient Instructions (Signed)

## 2018-03-16 ENCOUNTER — Ambulatory Visit (INDEPENDENT_AMBULATORY_CARE_PROVIDER_SITE_OTHER): Payer: Medicare Other | Admitting: Plastic Surgery

## 2018-03-16 ENCOUNTER — Encounter: Payer: Self-pay | Admitting: Physical Therapy

## 2018-03-16 ENCOUNTER — Encounter: Payer: Self-pay | Admitting: Plastic Surgery

## 2018-03-16 ENCOUNTER — Ambulatory Visit: Payer: Medicare Other | Attending: Physician Assistant | Admitting: Physical Therapy

## 2018-03-16 VITALS — BP 132/66 | HR 79 | Temp 99.2°F | Ht 66.5 in | Wt 161.0 lb

## 2018-03-16 DIAGNOSIS — Z9012 Acquired absence of left breast and nipple: Secondary | ICD-10-CM

## 2018-03-16 DIAGNOSIS — M25512 Pain in left shoulder: Secondary | ICD-10-CM | POA: Insufficient documentation

## 2018-03-16 DIAGNOSIS — C50912 Malignant neoplasm of unspecified site of left female breast: Secondary | ICD-10-CM

## 2018-03-16 NOTE — Therapy (Signed)
[facility] [address] [place], [address] Phone: [phone]   Fax:  [phone]  Physical Therapy Evaluation  Patient Details  Name: Mariah Hogan MRN: [id number] Date of Birth: 19-Sep-1950 Referring Provider (PT): D[place]   Encounter Date: 03/16/2018  PT End of Session - 03/16/18 1622    Visit Number  1    Number of Visits  17    Date for PT Re-Evaluation  05/11/18    PT Start Time  0300    PT Stop Time  0400    PT Time Calculation (min)  60 min    Activity Tolerance  Patient tolerated treatment well    Behavior During Therapy  [facility] for tasks assessed/performed       Past Medical History:  Diagnosis Date  . Anemia   . Arthritis   . Asthma    H/O YEARS AGO-NOW HAS COUGHING SPELLS WHICH IS WHY SHE HAS HER ALBUTEROL INHALER  . Breast cancer ([place]) 08/04/2017   1.8 cm ER 90%, PR 20%, HER-2/neu negative invasive mammary carcinoma of the left upper outer quadrant.  MammaPrint: High risk.  . Cancer ([place])    [phone] basal cell carcinoma  . Complication of anesthesia    HARD TIME WAKING UP  . Dysrhythmia    TACHYCARDIA  . GERD (gastroesophageal reflux disease)   . History of hiatal hernia    SMALL  . Hyperlipidemia   . Hypertension    PT STATES IN 01-18-18 PREOP PHONE INTERVIEW THAT HER BP HAS BEEN ELEVATED SINCE STARTING ON VALSARTAN- PCP SWITCHED PT BACK TO MAXIDE ON 01-18-18 AND PT STATES THAT SHE HAS LOST 6-10 POUNDS OF FLUID SINCE RESTARTING MAXIDE.    Marland Kitchen PONV (postoperative nausea and vomiting)     Past Surgical History:  Procedure Laterality Date  . ABDOMINAL HYSTERECTOMY  1998  . bladder tack  [id number]  . BREAST BIOPSY Bilateral   . BREAST BIOPSY Left 1990  . BREAST BIOPSY Left 2018   core bx- neg  . 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  Marland Kitchen BREAST CYST EXCISION  x3  . BREAST RECONSTRUCTION WITH PLACEMENT OF TISSUE EXPANDER AND FLEX HD  (ACELLULAR HYDRATED DERMIS) Left 01/27/2018   Procedure: BREAST RECONSTRUCTION WITH PLACEMENT OF TISSUE EXPANDER AND FLEX HD (ACELLULAR HYDRATED DERMIS);  Surgeon: Wallace Going, DO;  Location: [facility] ORS;  Service: Plastics;  Laterality: Left;  . BREAST SURGERY Left 02/13/14   Fibrocystic changes, pseudo-angiomatous stromal hyperplasia. No atypia or malignancy.  . CARPAL TUNNEL RELEASE  x2  . CHOLECYSTECTOMY  2008  . COLONOSCOPY WITH PROPOFOL N/A 01/12/2015   Procedure: COLONOSCOPY WITH PROPOFOL;  Surgeon: Manya Silvas, MD;  Location: [facility] ENDOSCOPY;  Service: Endoscopy;  Laterality: N/A;  . ESOPHAGOGASTRODUODENOSCOPY (EGD) WITH PROPOFOL N/A 01/12/2015   Procedure: ESOPHAGOGASTRODUODENOSCOPY (EGD) WITH PROPOFOL;  Surgeon: Manya Silvas, MD;  Location: [facility] ENDOSCOPY;  Service: Endoscopy;  Laterality: N/A;  . MASTECTOMY W/ SENTINEL NODE BIOPSY Left 01/27/2018   ypT1c ypN0;  MASTECTOMY WITH SENTINEL LYMPH NODE BIOPSY;  Surgeon: Robert Bellow, MD;  Location: [facility] ORS;  Service: General;  Laterality: Left;  . NASAL RECONSTRUCTION  1983  . PORTACATH PLACEMENT N/A 10/21/2017   Procedure: INSERTION PORT-A-CATH;  Surgeon: Robert Bellow, MD;  Location: [facility] ORS;  Service: General;  Laterality: N/A;  . SAVORY DILATION N/A 01/12/2015   Procedure: Azzie Almas DILATION;  Surgeon: Manya Silvas, MD;  Location: [facility]  ENDOSCOPY;  Service: Endoscopy;  Laterality: N/A;  . TONSILLECTOMY AND ADENOIDECTOMY  1956    There were no vitals filed for this visit.   Subjective Assessment - 03/16/18 1507    Subjective  Decreased L shoulder ROM post- masectomy    Pertinent History  Patient is a 68 year old fmale s/p masectomy with expander placement 01/27/18 following stage 1 breast cancer diagnosis 08/05/17. Patient underwent chemo treatment for 4 and a half sessions from June-October, and had a very difficult time with chemo treatment. Patient reports no scheduled R masectomy at this time or plans to.  Patient has had 3 injections so far into expander, and had 1 scheduled today, but was unable to complete this d/t increased tension under the breast, causing pain. Patient reports the plan at this time is to have injection in 2 weeks and continue injections until the end of Feb/beginning of March, when she will have implant surgery. Patient reports pain with overhead motion at the L ant thorax, and pain at night. Patient reports she has lifting restrictions to 5lb. Patient reports some nausea from oral chemotherapy, that she is coursed to continue for 5 years. Patient is retired, but reports she enjoys walking on [employer], and enjoys spending time with her grandson (41month old).  Patient reports worst pain over past week 8/10, best 2/10.     Limitations  Lifting;House hold activities    Diagnostic tests  none to date for this issue    Patient Stated Goals  Increase ROM and be able to hold and play with her grandchildren    Currently in Pain?  Yes    Pain Score  2     Pain Location  Shoulder    Pain Orientation  Left    Pain Descriptors / Indicators  Dull;Nagging;Tightness    Pain Type  Surgical pain    Pain Onset  1 to 4 weeks ago    Pain Frequency  Constant    Aggravating Factors   sleeping on back, overhead motion, shoulder rotation    Pain Relieving Factors  Movement    Multiple Pain Sites  No           OBJECTIVE  MUSCULOSKELETAL: Tremor: Normal Bulk: Normal Tone: Normal    Cervical Screen AROM: WFL and painless with overpressure in all planes; EXCEPT sidebending 20d Spurlings negative ULTT Median: negative bilat ULTT Ulnar: negative bilat ULTT Radial:  negative bilat  Elbow Screen Elbow AROM:WNL  Palpation TTP at L obliques/intercostals directly under breast, near rib 5-7  Posture: forward head, rounded shoulders, thoracic kyphosis  Strength R/L 4-/4- Shoulder flexion (anterior deltoid/pec major/coracobrachialis, axillary n. (C5-6) and musculocutaneous n.  (C5-7)) 4+/4+ Shoulder abduction (deltoid/supraspinatus, axillary/suprascapular n, C5) 4+/4 Shoulder external rotation (infraspinatus/teres minor) 4+/4 Shoulder internal rotation (subcapularis/lats/pec major) 5/5 Shoulder extension (posterior deltoid, lats, teres major, axillary/thoracodorsal n.) 5/5 Elbow flexion (biceps brachii, brachialis, brachioradialis, musculoskeletal n, C5-6) 4+/4+ Elbow extension (triceps, radial n, C7)  AROM R/L 180/135 Shoulder flexion 180/142* Shoulder abduction T1/C7 Shoulder external rotation T2 bilat Shoulder internal rotation 60/60 Shoulder extension *Indicates pain, overpressure performed unless otherwise indicated  PROM R/L 180/150 Shoulder flexion 180/160 Shoulder abduction 90/88 Shoulder external rotation 90/90 Shoulder internal rotation *Indicates pain, overpressure performed unless otherwise indicated  Accessory Motions/Glides Glenohumeral: Posterior: wnl bilat Inferior: wnl bilat  Acromioclavicular:  Posterior: wnl bilat  Sternoclavicular: Posterior:wnl bilat  NEUROLOGICAL:  Mental Status Patient is oriented to person, place and time.  Recent memory is intact.  Remote memory is intact.  Attention span and concentration are intact.  Expressive speech is intact.  Patient's fund of knowledge is within normal limits for educational level.  Sensation Grossly intact to light touch bilateral UE and thorax as determined by testing dermatomes C2-T2 Proprioception and hot/cold testing deferred on this date  SPECIAL TESTS  Subacromial Impingement Hawkins-Kennedy: positive on LUE Neer (Block scapula, PROM flexion): Painful Arc (Pain from 60 to 120 degrees scaption): Positive LUE Empty Can: negative bilat External Rotation Resistance: Can: negative bilat Scapular Assist: Can: negative bilat  Ther-Ex - Sidelying thoracic/rib cage stretch with LUE overhead 30sec hold (HEP) - Standing at door halfmoon stretch 30sec hold (HEP) -  Education on self soft tissue massage to decrease muscle tension and decrease sensitivity, and importance of deep breathing and posture for full rib cage expansion, with neutral posture review.           Objective measurements completed on examination: See above findings.              PT Education - 03/16/18 1619    Education Details  Patient was educated on diagnosis, anatomy and pathology involved, prognosis, role of PT, and was given an HEP, demonstrating exercise with proper form following verbal and tactile cues, and was given a paper hand out to continue exercise at home. Pt was educated on and agreed to plan of care.    Person(s) Educated  Patient    Methods  Explanation;Demonstration;Tactile cues;Verbal cues;Handout    Comprehension  Verbalized understanding;Returned demonstration;Verbal cues required;Tactile cues required       PT Short Term Goals - 03/17/18 1537      PT SHORT TERM GOAL #1   Title  Pt will be independent with HEP in order to improve strength and decrease pain in order to improve pain-free function at home and work.        PT Long Term Goals - 03/17/18 1537      PT LONG TERM GOAL #1   Title  Pt will decrease worst pain as reported on NPRS by at least 3 points in order to demonstrate clinically significant reduction in pain.    Baseline  03/16/18 4/10    Time  8    Period  Weeks    Status  New      PT LONG TERM GOAL #2   Title  Patient will increase FOTO score to 67 to demonstrate predicted increase in functional mobility to complete ADLs    Baseline  03/16/18 50    Time  8    Period  Weeks    Status  New      PT LONG TERM GOAL #3   Title  Patient will demonstrate full active shoulder ROM in order to return to PLOF and complete ADLs    Baseline  03/16/18 see eval    Time  8    Period  Weeks    Status  New             Plan - 03/17/18 1542    Clinical Impression Statement  Patient is a 68 year old female presenting with L  shoulder/thorax pain post mastectomy with expander placement 01/28/19 (with subsequent injections). Patient with current limitations in shoulder/trunk mobility and ROM, strength, pain, and postural impairments. Patient is currently unable to complete overhead or behind the back motion, or sleep without pain; inhibiting her participation in ADLs. Would benefit from skilled PT to address above deficits and promote optimal return to [facility]    Clinical Presentation  Evolving    Clinical Presentation due to:  2 personal factors/comorbidities, 3 or more body systems/activity limitations/participation restrictions      Clinical Decision Making  Moderate    Rehab Potential  Good    Clinical Impairments Affecting Rehab Potential  (+) motivation, social support (-) age, active cancer, other comorbidities    PT Frequency  2x / week    PT Duration  8 weeks    PT Treatment/Interventions  ADLs/Self Care Home Management;Electrical Stimulation;Neuromuscular re-education;Functional mobility training;Therapeutic exercise;Therapeutic activities;Moist Heat;Traction;Ultrasound;Manual lymph drainage;Patient/family education;Passive range of motion;Joint Manipulations;Dry needling;Manual techniques;Scar mobilization;Taping    PT Next Visit Plan  decrease muscle tension, review HEP    PT Home Exercise Plan  Sidelying thoracic stretch, doorway half moon stretch, posture    Consulted and Agree with Plan of Care  Patient       Patient will benefit from skilled therapeutic intervention in order to improve the following deficits and impairments:  Decreased endurance, Decreased mobility, Hypomobility, Improper body mechanics, Decreased range of motion, Decreased scar mobility, Postural dysfunction, Pain, Impaired UE functional use, Impaired flexibility, Increased fascial restricitons, Decreased activity tolerance, Decreased strength  Visit Diagnosis: Acute pain of left shoulder     Problem List Patient Active Problem List    Diagnosis Date Noted  . Acquired absence of breast 02/09/2018  . S/P mastectomy, left 02/02/2018  . Breast cancer ([place]) 01/27/2018  . Thrush 11/05/2017  . Goals of care, counseling/discussion 10/09/2017  . Hypokalemia 10/09/2017  . Acute renal failure (ARF) ([place]) 09/11/2017  . Diarrhea 09/11/2017  . Encounter for antineoplastic chemotherapy 09/03/2017  . Nodule of upper lobe of right lung 08/24/2017  . Osteopenia 08/24/2017  . Malignant neoplasm of left female breast ([place]) 08/07/2017  . 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 Incomplete emptying of bladder 09/03/2016  . SUI (stress urinary incontinence, female) 09/03/2016  . Mass of right breast 02/14/2014  . Mass of upper outer quadrant of left breast 01/20/2014  . Arthritis 12/28/2013  . Asthma without status asthmaticus 12/28/2013  . Esophageal reflux 12/28/2013  . Hyperlipidemia 12/28/2013  . Hypertension 05/09/2003   Shelton Silvas PT, DPT Shelton Silvas 03/17/2018, 4:09 PM  [facility] [address], [place], [address] Phone: [phone]   Fax:  [phone]  Name: RONYA GILCREST MRN: [id number] Date of Birth: 04-06-1950

## 2018-03-16 NOTE — Progress Notes (Signed)
   Subjective:    Patient ID: Mariah Hogan, female    DOB: 1950-04-15, 68 y.o.   MRN: [id number]  Mrs. Lavallie is a 68 year old white female here for her breast reconstruction follow-up.  The left breast expander is in place and expanded to 300 cc.  She is tolerating the expansion well.  There is no sign of hematoma or seroma.  The incision is closed andl healing well.  She still has pain at the inframammary fold area.  It is intermittent and controlled with pain medication.   Review of Systems  Constitutional: Negative.   HENT: Negative.   Eyes: Negative.   Respiratory: Negative.   Gastrointestinal: Negative.   Endocrine: Negative.   Genitourinary: Negative.   Musculoskeletal: Negative.   Skin: Negative.       Objective:   Physical Exam Vitals signs and nursing note reviewed.  Constitutional:      Appearance: Normal appearance.  HENT:     Head: Normocephalic and atraumatic.     Mouth/Throat:     Mouth: Mucous membranes are moist.  Eyes:     Extraocular Movements: Extraocular movements intact.  Cardiovascular:     Rate and Rhythm: Normal rate.  Skin:    General: Skin is warm.  Neurological:     Mental Status: She is alert.  Psychiatric:        Mood and Affect: Mood normal.        Thought Content: Thought content normal.        Judgment: Judgment normal.       Assessment & Plan:  Invasive ductal carcinoma of left breast (HCC)  S/P mastectomy, left  Acquired absence of left breast  And at her next visit and wait on expansion for today.  She has had physical therapy today for the first time.  She is in agreement with the 2 weeks.

## 2018-03-19 ENCOUNTER — Encounter: Payer: Self-pay | Admitting: Physical Therapy

## 2018-03-19 ENCOUNTER — Ambulatory Visit: Payer: Medicare Other

## 2018-03-19 DIAGNOSIS — M25512 Pain in left shoulder: Secondary | ICD-10-CM | POA: Diagnosis not present

## 2018-03-19 NOTE — Therapy (Signed)
[place] [facility] [address] [place], [address] Phone: [phone]   Fax:  [phone]  Physical Therapy Treatment  Patient Details  Name: Mariah Hogan MRN: [id number] Date of Birth: [birth year] Referring Provider (PT): Dillingham   Encounter Date: 03/19/2018  PT End of Session - 03/19/18 0915    Visit Number  2    Number of Visits  17    Date for PT Re-Evaluation  05/11/18    PT Start Time  0915    PT Stop Time  1002    PT Time Calculation (min)  47 min    Activity Tolerance  Patient tolerated treatment well    Behavior During Therapy  [facility] for tasks assessed/performed       Past Medical History:  Diagnosis Date  . Anemia   . Arthritis   . Asthma    H/O YEARS AGO-NOW HAS COUGHING SPELLS WHICH IS WHY SHE HAS HER ALBUTEROL INHALER  . Breast cancer ([place]) 08/04/2017   1.8 cm ER 90%, PR 20%, HER-2/neu negative invasive mammary carcinoma of the left upper outer quadrant.  MammaPrint: High risk.  . Cancer ([place])    [phone] basal cell carcinoma  . Complication of anesthesia    HARD TIME WAKING UP  . Dysrhythmia    TACHYCARDIA  . GERD (gastroesophageal reflux disease)   . History of hiatal hernia    SMALL  . Hyperlipidemia   . Hypertension    PT STATES IN 01-18-18 PREOP PHONE INTERVIEW THAT HER BP HAS BEEN ELEVATED SINCE STARTING ON VALSARTAN- PCP SWITCHED PT BACK TO MAXIDE ON 01-18-18 AND PT STATES THAT SHE HAS LOST 6-10 POUNDS OF FLUID SINCE RESTARTING MAXIDE.    Marland Kitchen PONV (postoperative nausea and vomiting)     Past Surgical History:  Procedure Laterality Date  . ABDOMINAL HYSTERECTOMY  1998  . bladder tack  8367,2550  . BREAST BIOPSY Bilateral   . BREAST BIOPSY Left 1990  . BREAST BIOPSY Left 2018   core bx- neg  . 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  Marland Kitchen BREAST CYST EXCISION  x3  . BREAST RECONSTRUCTION WITH PLACEMENT OF TISSUE EXPANDER AND FLEX HD  (ACELLULAR HYDRATED DERMIS) Left 01/27/2018   Procedure: BREAST RECONSTRUCTION WITH PLACEMENT OF TISSUE EXPANDER AND FLEX HD (ACELLULAR HYDRATED DERMIS);  Surgeon: Wallace Going, DO;  Location: [facility] ORS;  Service: Plastics;  Laterality: Left;  . BREAST SURGERY Left 02/13/14   Fibrocystic changes, pseudo-angiomatous stromal hyperplasia. No atypia or malignancy.  . CARPAL TUNNEL RELEASE  x2  . CHOLECYSTECTOMY  2008  . COLONOSCOPY WITH PROPOFOL N/A 01/12/2015   Procedure: COLONOSCOPY WITH PROPOFOL;  Surgeon: Manya Silvas, MD;  Location: [facility] ENDOSCOPY;  Service: Endoscopy;  Laterality: N/A;  . ESOPHAGOGASTRODUODENOSCOPY (EGD) WITH PROPOFOL N/A 01/12/2015   Procedure: ESOPHAGOGASTRODUODENOSCOPY (EGD) WITH PROPOFOL;  Surgeon: Manya Silvas, MD;  Location: [facility] ENDOSCOPY;  Service: Endoscopy;  Laterality: N/A;  . MASTECTOMY W/ SENTINEL NODE BIOPSY Left 01/27/2018   ypT1c ypN0;  MASTECTOMY WITH SENTINEL LYMPH NODE BIOPSY;  Surgeon: Robert Bellow, MD;  Location: [facility] ORS;  Service: General;  Laterality: Left;  . NASAL RECONSTRUCTION  1983  . PORTACATH PLACEMENT N/A 10/21/2017   Procedure: INSERTION PORT-A-CATH;  Surgeon: Robert Bellow, MD;  Location: [facility] ORS;  Service: General;  Laterality: N/A;  . SAVORY DILATION N/A 01/12/2015   Procedure: Azzie Almas DILATION;  Surgeon: Manya Silvas, MD;  Location: [facility]  ENDOSCOPY;  Service: Endoscopy;  Laterality: N/A;  . TONSILLECTOMY AND ADENOIDECTOMY  1956    There were no vitals filed for this visit.  Subjective Assessment - 03/19/18 1008    Subjective  Pt with complaints of 9/10 pain in L anterior/lateral rib cage last night. Currently pain is manageable. Good compliance with HEP.    Pertinent History  Patient is a 68 year old fmale s/p masectomy with expander placement 01/27/18 following stage 1 breast cancer diagnosis 08/05/17. Patient underwent chemo treatment for 4 and a half sessions from June-October, and had a very difficult time with  chemo treatment. Patient reports no scheduled R masectomy at this time or plans to. Patient has had 3 injections so far into expander, and had 1 scheduled today, but was unable to complete this d/t increased tension under the breast, causing pain. Patient reports the plan at this time is to have injection in 2 weeks and continue injections until the end of Feb/beginning of March, when she will have implant surgery. Patient reports pain with overhead motion at the L ant thorax, and pain at night. Patient reports she has lifting restrictions to 5lb. Patient reports some nausea from oral chemotherapy, that she is coursed to continue for 5 years. Patient is retired, but reports she enjoys walking on [employer], and enjoys spending time with her grandson (39month old).  Patient reports worst pain over past week 8/10, best 2/10.     Limitations  Lifting;House hold activities    Diagnostic tests  none to date for this issue    Patient Stated Goals  Increase ROM and be able to hold and play with her grandchildren    Currently in Pain?  Yes    Pain Score  2     Pain Location  Shoulder    Pain Orientation  Left    Pain Descriptors / Indicators  Tender;Sore       TREATMENT:  Therapeutic exercise: Performed with verbal/tactile and visual cues with good carry over with repetition; goal: increase mobility, shoulder ROM, decrease muscle tension/accessory muscle use  diaphragmatic breathing in supine hand on chest/abdomen to assess muscle use X15 reps , verbal and tactile cues for pursed lip breathing technique and to relax shoulders Supine shoulder flexion AAROM with dowel 10x3 sec holds, one instance of increased pain 7-8/10 Supine shoulder abduction AAROM with dowel 10x3sec hold 2 instances of increased pain (unable to elevate past 90deg) Seated therapy ball roll outs 10x5sec hold forward flexion Seated therapy ball roll outs into R thoracic lateral flexion 10x5sec holds   Manual therapy:  x187ms Superficial and deep technique utilized, including intermittent trigger pointing to L latissimus dorsi, serratus anterior, rib intercostal musculature. Pt with significant TTP, mild-mod improvement s/p STM. Muscle tension noted throughout.  Response to treatment: Pt very eager to participate with therapy. PT and pt spent time reviewing condition, expectations of POC, and exercise modification to address increased pain after evaluation. HEP modified and administered, reviewed.     PT Education - 03/19/18 1009    Education Details  HEP, exercise technique/form, exercise modification    Person(s) Educated  Patient    Methods  Explanation;Demonstration;Tactile cues;Verbal cues;Handout    Comprehension  Verbalized understanding;Returned demonstration;Verbal cues required;Tactile cues required;Need further instruction       PT Short Term Goals - 03/17/18 1537      PT SHORT TERM GOAL #1   Title  Pt will be independent with HEP in order to improve strength and decrease pain in  order to improve pain-free function at home and work.        PT Long Term Goals - 03/17/18 1537      PT LONG TERM GOAL #1   Title  Pt will decrease worst pain as reported on NPRS by at least 3 points in order to demonstrate clinically significant reduction in pain.    Baseline  03/16/18 4/10    Time  8    Period  Weeks    Status  New      PT LONG TERM GOAL #2   Title  Patient will increase FOTO score to 67 to demonstrate predicted increase in functional mobility to complete ADLs    Baseline  03/16/18 50    Time  8    Period  Weeks    Status  New      PT LONG TERM GOAL #3   Title  Patient will demonstrate full active shoulder ROM in order to return to PLOF and complete ADLs    Baseline  03/16/18 see eval    Time  8    Period  Weeks    Status  New            Plan - 03/19/18 1009    Clinical Impression Statement  Patient with increased L sided rib cage pain with supine AAROM exercises, instructed in  proper modification with multimodal cues in movement with good carryover, good tolerance to seated AAROM activities. Pt TTP along  L lat, serratus anterior,  and rib intercostals with mild improvement post STM/trigger pointing. HEP modified to address increased pain. The patient would benefit from further skilled PT to assess response to treatment and to continue to address functional limitations.     Rehab Potential  Good    Clinical Impairments Affecting Rehab Potential  (+) motivation, social support (-) age, active cancer, other comorbidities    PT Frequency  2x / week    PT Duration  8 weeks    PT Treatment/Interventions  ADLs/Self Care Home Management;Electrical Stimulation;Neuromuscular re-education;Functional mobility training;Therapeutic exercise;Therapeutic activities;Moist Heat;Traction;Ultrasound;Manual lymph drainage;Patient/family education;Passive range of motion;Joint Manipulations;Dry needling;Manual techniques;Scar mobilization;Taping    PT Next Visit Plan  decrease muscle tension, review HEP    PT Home Exercise Plan  [id number] on medbridge    Consulted and Agree with Plan of Care  Patient       Patient will benefit from skilled therapeutic intervention in order to improve the following deficits and impairments:  Decreased endurance, Decreased mobility, Hypomobility, Improper body mechanics, Decreased range of motion, Decreased scar mobility, Postural dysfunction, Pain, Impaired UE functional use, Impaired flexibility, Increased fascial restricitons, Decreased activity tolerance, Decreased strength  Visit Diagnosis: Acute pain of left shoulder     Problem List Patient Active Problem List   Diagnosis Date Noted  . Acquired absence of breast 02/09/2018  . S/P mastectomy, left 02/02/2018  . Breast cancer ([place]) 01/27/2018  . Thrush 11/05/2017  . Goals of care, counseling/discussion 10/09/2017  . Hypokalemia 10/09/2017  . Acute renal failure (ARF) ([place]) 09/11/2017  .  Diarrhea 09/11/2017  . Encounter for antineoplastic chemotherapy 09/03/2017  . Nodule of upper lobe of right lung 08/24/2017  . Osteopenia 08/24/2017  . Malignant neoplasm of left female breast ([place]) 08/07/2017  . 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 Incomplete emptying of bladder 09/03/2016  . SUI (stress urinary incontinence, female) 09/03/2016  . Mass of right breast 02/14/2014  . Mass of upper outer quadrant of left breast 01/20/2014  .  Arthritis 12/28/2013  . Asthma without status asthmaticus 12/28/2013  . Esophageal reflux 12/28/2013  . Hyperlipidemia 12/28/2013  . Hypertension 05/09/2003    Lieutenant Diego PT, DPT 10:21 AM,03/19/18 [place] [facility] [address], [place], [address] Phone: [phone]   Fax:  [phone]  Name: Mariah Hogan MRN: [id number] Date of Birth: [birth year]

## 2018-03-22 ENCOUNTER — Ambulatory Visit: Payer: Medicare Other | Admitting: Physical Therapy

## 2018-03-22 ENCOUNTER — Encounter: Payer: Self-pay | Admitting: Physical Therapy

## 2018-03-22 DIAGNOSIS — M25512 Pain in left shoulder: Secondary | ICD-10-CM

## 2018-03-22 NOTE — Therapy (Signed)
[place] [facility] [address] [place], [address] Phone: [phone]   Fax:  [phone]  Physical Therapy Treatment  Patient Details  Name: Mariah Hogan MRN: [id number] Date of Birth: [birth year] Referring Provider (PT): Dillingham   Encounter Date: 03/22/2018  PT End of Session - 03/22/18 1452    Visit Number  3    Number of Visits  17    Date for PT Re-Evaluation  05/11/18    PT Start Time  0145    PT Stop Time  0230    PT Time Calculation (min)  45 min    Activity Tolerance  Patient tolerated treatment well    Behavior During Therapy  [facility] for tasks assessed/performed       Past Medical History:  Diagnosis Date  . Anemia   . Arthritis   . Asthma    H/O YEARS AGO-NOW HAS COUGHING SPELLS WHICH IS WHY SHE HAS HER ALBUTEROL INHALER  . Breast cancer ([place]) 08/04/2017   1.8 cm ER 90%, PR 20%, HER-2/neu negative invasive mammary carcinoma of the left upper outer quadrant.  MammaPrint: High risk.  . Cancer ([place])    [phone] basal cell carcinoma  . Complication of anesthesia    HARD TIME WAKING UP  . Dysrhythmia    TACHYCARDIA  . GERD (gastroesophageal reflux disease)   . History of hiatal hernia    SMALL  . Hyperlipidemia   . Hypertension    PT STATES IN 01-18-18 PREOP PHONE INTERVIEW THAT HER BP HAS BEEN ELEVATED SINCE STARTING ON VALSARTAN- PCP SWITCHED PT BACK TO MAXIDE ON 01-18-18 AND PT STATES THAT SHE HAS LOST 6-10 POUNDS OF FLUID SINCE RESTARTING MAXIDE.    Marland Kitchen PONV (postoperative nausea and vomiting)     Past Surgical History:  Procedure Laterality Date  . ABDOMINAL HYSTERECTOMY  1998  . bladder tack  4401,0272  . BREAST BIOPSY Bilateral   . BREAST BIOPSY Left 1990  . BREAST BIOPSY Left 2018   core bx- neg  . 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  Marland Kitchen BREAST CYST EXCISION  x3  . BREAST RECONSTRUCTION WITH PLACEMENT OF TISSUE EXPANDER AND FLEX HD  (ACELLULAR HYDRATED DERMIS) Left 01/27/2018   Procedure: BREAST RECONSTRUCTION WITH PLACEMENT OF TISSUE EXPANDER AND FLEX HD (ACELLULAR HYDRATED DERMIS);  Surgeon: [place] Going, DO;  Location: [facility] ORS;  Service: Plastics;  Laterality: Left;  . BREAST SURGERY Left 02/13/14   Fibrocystic changes, pseudo-angiomatous stromal hyperplasia. No atypia or malignancy.  . CARPAL TUNNEL RELEASE  x2  . CHOLECYSTECTOMY  2008  . COLONOSCOPY WITH PROPOFOL N/A 01/12/2015   Procedure: COLONOSCOPY WITH PROPOFOL;  Surgeon: Manya Silvas, MD;  Location: [facility] ENDOSCOPY;  Service: Endoscopy;  Laterality: N/A;  . ESOPHAGOGASTRODUODENOSCOPY (EGD) WITH PROPOFOL N/A 01/12/2015   Procedure: ESOPHAGOGASTRODUODENOSCOPY (EGD) WITH PROPOFOL;  Surgeon: Manya Silvas, MD;  Location: [facility] ENDOSCOPY;  Service: Endoscopy;  Laterality: N/A;  . MASTECTOMY W/ SENTINEL NODE BIOPSY Left 01/27/2018   ypT1c ypN0;  MASTECTOMY WITH SENTINEL LYMPH NODE BIOPSY;  Surgeon: Robert Bellow, MD;  Location: [facility] ORS;  Service: General;  Laterality: Left;  . NASAL RECONSTRUCTION  1983  . PORTACATH PLACEMENT N/A 10/21/2017   Procedure: INSERTION PORT-A-CATH;  Surgeon: Robert Bellow, MD;  Location: [facility] ORS;  Service: General;  Laterality: N/A;  . SAVORY DILATION N/A 01/12/2015   Procedure: Azzie Almas DILATION;  Surgeon: Manya Silvas, MD;  Location: [facility]  ENDOSCOPY;  Service: Endoscopy;  Laterality: N/A;  . TONSILLECTOMY AND ADENOIDECTOMY  1956    There were no vitals filed for this visit.  Subjective Assessment - 03/22/18 1350    Subjective  Patient reports 1/10 pain today. Patient reports compliance with HEP. Reports good pain relief following last session, which she is very pleased with.     Pertinent History  Patient is a 68 year old fmale s/p masectomy with expander placement 01/27/18 following stage 1 breast cancer diagnosis 08/05/17. Patient underwent chemo treatment for 4 and a half sessions from June-October, and had a  very difficult time with chemo treatment. Patient reports no scheduled R masectomy at this time or plans to. Patient has had 3 injections so far into expander, and had 1 scheduled today, but was unable to complete this d/t increased tension under the breast, causing pain. Patient reports the plan at this time is to have injection in 2 weeks and continue injections until the end of Feb/beginning of March, when she will have implant surgery. Patient reports pain with overhead motion at the L ant thorax, and pain at night. Patient reports she has lifting restrictions to 5lb. Patient reports some nausea from oral chemotherapy, that she is coursed to continue for 5 years. Patient is retired, but reports she enjoys walking on [employer], and enjoys spending time with her grandson (32month old).  Patient reports worst pain over past week 8/10, best 2/10.     Limitations  Lifting;House hold activities    Diagnostic tests  none to date for this issue    Patient Stated Goals  Increase ROM and be able to hold and play with her grandchildren    Pain Onset  1 to 4 weeks ago         TREATMENT:  Therapeutic exercise - Pulleys AAROM  abd and flex x15  Each with 3 sec hold before slow lowering with VC following demo for proper form with true AAROM and education for painfree ROM. Educated on addition to HEP with good demonstrate and verbal carry over following - Supine shoulder flexion AAROM with dowel 10 x3 sec holds; min cuing for maintained elbow ext with good carry over from last session - Supine shoulder abduction AAROM with dowel 10x3sec with min cuing initially for proper set up -Diaphragmatic breathing in supine with patient hand on belly for TC x15 with good carry over following cuing on decreased accessory muscle use to prevent compensation - Education on continuing diaphragmatic breathing as often as possible, with PT explaining to patient that rib cage expansion is a great way not only to stretch  intercostal muscles, but to prevent muscle tension from breath holding or shallow breathing. Education to continue shoulder motion as often as possible in pain free range to decrease sensitivity. Pt verbalized understanding of all provided education.    Manual therapy: - Superficial and deep technique utilized, including intermittent trigger pointing to L latissimus dorsi, serratus anterior, rib intercostal musculature. Pt with significant TTP, mild-mod improvement s/p STM. Muscle tension noted throughout. - grade I-II GHJ AP mobs with patient in 90d abdution to decrease pain 30sec bouts 6 bouts; moving into grade III for increased ROM for 6 bouts - Multiple bouts of PROM with focus on abd and flex with 10sec hold and increasing ROM per pt tolerance                        PT Education - 03/22/18 1451  Education Details  Exercise form    Person(s) Educated  Patient    Methods  Explanation;Demonstration;Verbal cues    Comprehension  Returned demonstration;Verbalized understanding;Verbal cues required       PT Short Term Goals - 03/17/18 1537      PT SHORT TERM GOAL #1   Title  Pt will be independent with HEP in order to improve strength and decrease pain in order to improve pain-free function at home and work.        PT Long Term Goals - 03/17/18 1537      PT LONG TERM GOAL #1   Title  Pt will decrease worst pain as reported on NPRS by at least 3 points in order to demonstrate clinically significant reduction in pain.    Baseline  03/16/18 4/10    Time  8    Period  Weeks    Status  New      PT LONG TERM GOAL #2   Title  Patient will increase FOTO score to 67 to demonstrate predicted increase in functional mobility to complete ADLs    Baseline  03/16/18 50    Time  8    Period  Weeks    Status  New      PT LONG TERM GOAL #3   Title  Patient will demonstrate full active shoulder ROM in order to return to PLOF and complete ADLs    Baseline  03/16/18 see eval     Time  8    Period  Weeks    Status  New            Plan - 03/22/18 1528    Clinical Impression Statement  Patient reports she ceased doorway and sidelying stretching from evaluation to substitue for supine AAROM flex and abd, whoich she has been able to complete with decreased pain, demonstrating increased ROM today, and good arry over from last session. PT continued to utilize manual techniques to decrease soft tissue tension, which patient reports is helpful and decresae pain. PT reviewed all HEP recommendations, where patient is able to demonstrate axcuracy with therex following some cuing for set up and initial reps. Patient will continue to benefit from skilled PT to continue to address pain and ROM deficits    Rehab Potential  Good    Clinical Impairments Affecting Rehab Potential  (+) motivation, social support (-) age, active cancer, other comorbidities    PT Frequency  2x / week    PT Duration  8 weeks    PT Treatment/Interventions  ADLs/Self Care Home Management;Electrical Stimulation;Neuromuscular re-education;Functional mobility training;Therapeutic exercise;Therapeutic activities;Moist Heat;Traction;Ultrasound;Manual lymph drainage;Patient/family education;Passive range of motion;Joint Manipulations;Dry needling;Manual techniques;Scar mobilization;Taping    PT Next Visit Plan  decrease muscle tension, review HEP    PT Home Exercise Plan  [id number] on medbridge    Consulted and Agree with Plan of Care  Patient       Patient will benefit from skilled therapeutic intervention in order to improve the following deficits and impairments:  Decreased endurance, Decreased mobility, Hypomobility, Improper body mechanics, Decreased range of motion, Decreased scar mobility, Postural dysfunction, Pain, Impaired UE functional use, Impaired flexibility, Increased fascial restricitons, Decreased activity tolerance, Decreased strength  Visit Diagnosis: Acute pain of left  shoulder     Problem List Patient Active Problem List   Diagnosis Date Noted  . Acquired absence of breast 02/09/2018  . S/P mastectomy, left 02/02/2018  . Breast cancer ([place]) 01/27/2018  . Thrush 11/05/2017  . Goals of  care, counseling/discussion 10/09/2017  . Hypokalemia 10/09/2017  . Acute renal failure (ARF) ([place]) 09/11/2017  . Diarrhea 09/11/2017  . Encounter for antineoplastic chemotherapy 09/03/2017  . Nodule of upper lobe of right lung 08/24/2017  . Osteopenia 08/24/2017  . Malignant neoplasm of left female breast ([place]) 08/07/2017  . 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 Incomplete emptying of bladder 09/03/2016  . SUI (stress urinary incontinence, female) 09/03/2016  . Mass of right breast 02/14/2014  . Mass of upper outer quadrant of left breast 01/20/2014  . Arthritis 12/28/2013  . Asthma without status asthmaticus 12/28/2013  . Esophageal reflux 12/28/2013  . Hyperlipidemia 12/28/2013  . Hypertension 05/09/2003   Shelton Silvas PT, DPT Shelton Silvas 03/22/2018, 3:39 PM  [place] [facility] [address], [place], [address] Phone: [phone]   Fax:  [phone]  Name: DOROTHEE NAPIERKOWSKI MRN: [id number] Date of Birth: Nov 09, 1950

## 2018-03-24 ENCOUNTER — Ambulatory Visit: Payer: Medicare Other | Admitting: Physical Therapy

## 2018-03-29 ENCOUNTER — Encounter: Payer: Self-pay | Admitting: Physical Therapy

## 2018-03-29 ENCOUNTER — Ambulatory Visit: Payer: Medicare Other | Admitting: Physical Therapy

## 2018-03-29 DIAGNOSIS — M25512 Pain in left shoulder: Secondary | ICD-10-CM | POA: Diagnosis not present

## 2018-03-29 NOTE — Therapy (Signed)
[facility] [address] [place], [address] Phone: [phone]   Fax:  [phone]  Physical Therapy Treatment  Patient Details  Name: Mariah Hogan MRN: [id number] Date of Birth: April 24, 1950 Referring Provider (PT): Dillingham   Encounter Date: 03/29/2018    Past Medical History:  Diagnosis Date  . Anemia   . Arthritis   . Asthma    H/O YEARS AGO-NOW HAS COUGHING SPELLS WHICH IS WHY SHE HAS HER ALBUTEROL INHALER  . Breast cancer ([place]) 08/04/2017   1.8 cm ER 90%, PR 20%, HER-2/neu negative invasive mammary carcinoma of the left upper outer quadrant.  MammaPrint: High risk.  . Cancer ([place])    [phone] basal cell carcinoma  . Complication of anesthesia    HARD TIME WAKING UP  . Dysrhythmia    TACHYCARDIA  . GERD (gastroesophageal reflux disease)   . History of hiatal hernia    SMALL  . Hyperlipidemia   . Hypertension    PT STATES IN 01-18-18 PREOP PHONE INTERVIEW THAT HER BP HAS BEEN ELEVATED SINCE STARTING ON VALSARTAN- PCP SWITCHED PT BACK TO MAXIDE ON 01-18-18 AND PT STATES THAT SHE HAS LOST 6-10 POUNDS OF FLUID SINCE RESTARTING MAXIDE.    Marland Kitchen PONV (postoperative nausea and vomiting)     Past Surgical History:  Procedure Laterality Date  . ABDOMINAL HYSTERECTOMY  1998  . bladder tack  7026,[id number]  . BREAST BIOPSY Bilateral   . BREAST BIOPSY Left 1990  . BREAST BIOPSY Left 2018   core bx- neg  . 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  Marland Kitchen BREAST CYST EXCISION  x3  . BREAST RECONSTRUCTION WITH PLACEMENT OF TISSUE EXPANDER AND FLEX HD (ACELLULAR HYDRATED DERMIS) Left 01/27/2018   Procedure: BREAST RECONSTRUCTION WITH PLACEMENT OF TISSUE EXPANDER AND FLEX HD (ACELLULAR HYDRATED DERMIS);  Surgeon: Wallace Going, DO;  Location: [facility] ORS;  Service: Plastics;  Laterality: Left;  . BREAST SURGERY Left 02/13/14   Fibrocystic changes, pseudo-angiomatous  stromal hyperplasia. No atypia or malignancy.  . CARPAL TUNNEL RELEASE  x2  . CHOLECYSTECTOMY  2008  . COLONOSCOPY WITH PROPOFOL N/A 01/12/2015   Procedure: COLONOSCOPY WITH PROPOFOL;  Surgeon: Manya Silvas, MD;  Location: [facility] ENDOSCOPY;  Service: Endoscopy;  Laterality: N/A;  . ESOPHAGOGASTRODUODENOSCOPY (EGD) WITH PROPOFOL N/A 01/12/2015   Procedure: ESOPHAGOGASTRODUODENOSCOPY (EGD) WITH PROPOFOL;  Surgeon: Manya Silvas, MD;  Location: [facility] ENDOSCOPY;  Service: Endoscopy;  Laterality: N/A;  . MASTECTOMY W/ SENTINEL NODE BIOPSY Left 01/27/2018   ypT1c ypN0;  MASTECTOMY WITH SENTINEL LYMPH NODE BIOPSY;  Surgeon: Robert Bellow, MD;  Location: [facility] ORS;  Service: General;  Laterality: Left;  . NASAL RECONSTRUCTION  1983  . PORTACATH PLACEMENT N/A 10/21/2017   Procedure: INSERTION PORT-A-CATH;  Surgeon: Robert Bellow, MD;  Location: [facility] ORS;  Service: General;  Laterality: N/A;  . SAVORY DILATION N/A 01/12/2015   Procedure: Azzie Almas DILATION;  Surgeon: Manya Silvas, MD;  Location: [facility] ENDOSCOPY;  Service: Endoscopy;  Laterality: N/A;  . TONSILLECTOMY AND ADENOIDECTOMY  1956    There were no vitals filed for this visit.       Therapeutic exercise - Pulleys AAROM  abd and flex x15  Each with 3 sec hold before slow lowering with VC following demo for proper form with true AAROM and education for painfree ROM. Educated on addition to HEP with good demonstrate and verbal carry over following - Seated rows with  GTB 3x 10 with cuing for scapular retraction with good carry over following  - Education on continuing diaphragmatic breathing as often as possible, with PT explaining to patient that rib cage expansion is a great way not only to stretch intercostal muscles, but to prevent muscle tension from breath holding or shallow breathing. Education to continue shoulder motion as often as possible in pain free range to decrease sensitivity. Pt verbalized understanding of all provided  education.    Manual therapy: - Superficial and deep technique utilized, including intermittent trigger pointing to L latissimus dorsi, serratus anterior, rib intercostal musculature. Pt with significant TTP, mild-mod improvement s/p STM. Muscle tension noted throughout. - grade I-II GHJ AP mobs with patient in 90d abdution to decrease pain 30sec bouts 6 bouts; moving into grade III for increased ROM for 6 bouts - Multiple bouts of PROM with focus on abd and flex with 10sec hold and increasing ROM per pt tolerance                        PT Short Term Goals - 03/17/18 1537      PT SHORT TERM GOAL #1   Title  Pt will be independent with HEP in order to improve strength and decrease pain in order to improve pain-free function at home and work.        PT Long Term Goals - 03/17/18 1537      PT LONG TERM GOAL #1   Title  Pt will decrease worst pain as reported on NPRS by at least 3 points in order to demonstrate clinically significant reduction in pain.    Baseline  03/16/18 4/10    Time  8    Period  Weeks    Status  New      PT LONG TERM GOAL #2   Title  Patient will increase FOTO score to 67 to demonstrate predicted increase in functional mobility to complete ADLs    Baseline  03/16/18 50    Time  8    Period  Weeks    Status  New      PT LONG TERM GOAL #3   Title  Patient will demonstrate full active shoulder ROM in order to return to PLOF and complete ADLs    Baseline  03/16/18 see eval    Time  8    Period  Weeks    Status  New            Plan - 04/06/18 1215    Clinical Impression Statement  Pt is continuing to respond well to manual tehcniques, allowing for increased therex progression. Patient is able to complete all therex with accuracy following PT cuing. PT will continue progression as able.     Rehab Potential  Good    Clinical Impairments Affecting Rehab Potential  (+) motivation, social support (-) age, active cancer, other comorbidities     PT Frequency  2x / week    PT Duration  8 weeks    PT Treatment/Interventions  ADLs/Self Care Home Management;Electrical Stimulation;Neuromuscular re-education;Functional mobility training;Therapeutic exercise;Therapeutic activities;Moist Heat;Traction;Ultrasound;Manual lymph drainage;Patient/family education;Passive range of motion;Joint Manipulations;Dry needling;Manual techniques;Scar mobilization;Taping    PT Next Visit Plan  decrease muscle tension, periscapular strengthening     PT Home Exercise Plan  [id number] on medbridge    Consulted and Agree with Plan of Care  Patient       Patient will benefit from skilled therapeutic intervention in order to improve the following  deficits and impairments:  Decreased endurance, Decreased mobility, Hypomobility, Improper body mechanics, Decreased range of motion, Decreased scar mobility, Postural dysfunction, Pain, Impaired UE functional use, Impaired flexibility, Increased fascial restricitons, Decreased activity tolerance, Decreased strength  Visit Diagnosis: Acute pain of left shoulder     Problem List Patient Active Problem List   Diagnosis Date Noted  . Acquired absence of breast 02/09/2018  . S/P mastectomy, left 02/02/2018  . Breast cancer ([place]) 01/27/2018  . Thrush 11/05/2017  . Goals of care, counseling/discussion 10/09/2017  . Hypokalemia 10/09/2017  . Acute renal failure (ARF) ([place]) 09/11/2017  . Diarrhea 09/11/2017  . Encounter for antineoplastic chemotherapy 09/03/2017  . Nodule of upper lobe of right lung 08/24/2017  . Osteopenia 08/24/2017  . Malignant neoplasm of left female breast ([place]) 08/07/2017  . 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 Incomplete emptying of bladder 09/03/2016  . SUI (stress urinary incontinence, female) 09/03/2016  . Mass of right breast 02/14/2014  . Mass of upper outer quadrant of left breast 01/20/2014  . Arthritis 12/28/2013  . Asthma without status asthmaticus  12/28/2013  . Esophageal reflux 12/28/2013  . Hyperlipidemia 12/28/2013  . Hypertension 05/09/2003   Shelton Silvas PT, DPT Shelton Silvas 04/06/2018, 12:18 PM  [facility] [address] [place], [address] Phone: [phone]   Fax:  [phone]  Name: BURNETT LIEBER MRN: [id number] Date of Birth: February 14, 1951

## 2018-03-30 ENCOUNTER — Ambulatory Visit (INDEPENDENT_AMBULATORY_CARE_PROVIDER_SITE_OTHER): Payer: Medicare Other | Admitting: Plastic Surgery

## 2018-03-30 ENCOUNTER — Encounter: Payer: Self-pay | Admitting: Plastic Surgery

## 2018-03-30 VITALS — BP 121/73 | HR 79 | Temp 99.0°F | Ht 66.5 in | Wt 161.0 lb

## 2018-03-30 DIAGNOSIS — Z9012 Acquired absence of left breast and nipple: Secondary | ICD-10-CM

## 2018-03-30 NOTE — Progress Notes (Signed)
   Subjective:    Patient ID: Mariah Hogan, female    DOB: Nov 07, 1950, 68 y.o.   MRN: [id number]  The patient is a 68 yrs old wf here for follow up on her left breast reconstruction.  She is doing well overall and happy with her progress.  No sign of infection and she has pretty good symmetry at this point.  We discussed the option for better symmetry with a mastopexy on the right side.   Review of Systems  Constitutional: Negative.   HENT: Negative.   Eyes: Negative.   Respiratory: Negative.   Cardiovascular: Negative.   Gastrointestinal: Negative.   Endocrine: Negative.   Genitourinary: Negative.   Musculoskeletal: Negative.   Skin: Negative.        Objective:   Physical Exam Vitals signs and nursing note reviewed.  Constitutional:      Appearance: Normal appearance.  HENT:     Head: Normocephalic and atraumatic.  Cardiovascular:     Rate and Rhythm: Normal rate.  Pulmonary:     Effort: Pulmonary effort is normal.  Neurological:     General: No focal deficit present.     Mental Status: She is alert.  Psychiatric:        Mood and Affect: Mood normal.        Thought Content: Thought content normal.        Judgment: Judgment normal.       Assessment & Plan:  Acquired absence of left breast  S/P mastectomy, left  We placed injectable saline in the Expander using a sterile technique:  Left: 50 cc for a total of 350 / 455 cc  Plan for left breast expander removal and placement of an implant with right breast mastopexy.

## 2018-03-31 ENCOUNTER — Ambulatory Visit: Payer: Medicare Other | Admitting: Physical Therapy

## 2018-03-31 ENCOUNTER — Encounter: Payer: Self-pay | Admitting: Physical Therapy

## 2018-03-31 DIAGNOSIS — M25512 Pain in left shoulder: Secondary | ICD-10-CM | POA: Diagnosis not present

## 2018-03-31 NOTE — Therapy (Signed)
[facility] [address] [place], [address] Phone: [phone]   Fax:  [phone]  Physical Therapy Treatment  Patient Details  Name: Mariah Hogan MRN: [id number] Date of Birth: May 04, 1950 Referring Provider (PT): Dillingham   Encounter Date: 03/31/2018  PT End of Session - 03/31/18 1425    Visit Number  5    Number of Visits  17    Date for PT Re-Evaluation  05/11/18    PT Start Time  0150    PT Stop Time  0230    PT Time Calculation (min)  40 min    Activity Tolerance  Patient tolerated treatment well    Behavior During Therapy  [facility] for tasks assessed/performed       Past Medical History:  Diagnosis Date  . Anemia   . Arthritis   . Asthma    H/O YEARS AGO-NOW HAS COUGHING SPELLS WHICH IS WHY SHE HAS HER ALBUTEROL INHALER  . Breast cancer ([place]) 08/04/2017   1.8 cm ER 90%, PR 20%, HER-2/neu negative invasive mammary carcinoma of the left upper outer quadrant.  MammaPrint: High risk.  . Cancer ([place])    [phone] basal cell carcinoma  . Complication of anesthesia    HARD TIME WAKING UP  . Dysrhythmia    TACHYCARDIA  . GERD (gastroesophageal reflux disease)   . History of hiatal hernia    SMALL  . Hyperlipidemia   . Hypertension    PT STATES IN 01-18-18 PREOP PHONE INTERVIEW THAT HER BP HAS BEEN ELEVATED SINCE STARTING ON VALSARTAN- PCP SWITCHED PT BACK TO MAXIDE ON 01-18-18 AND PT STATES THAT SHE HAS LOST 6-10 POUNDS OF FLUID SINCE RESTARTING MAXIDE.    Marland Kitchen PONV (postoperative nausea and vomiting)     Past Surgical History:  Procedure Laterality Date  . ABDOMINAL HYSTERECTOMY  1998  . bladder tack  2119,4174  . BREAST BIOPSY Bilateral   . BREAST BIOPSY Left 1990  . BREAST BIOPSY Left 2018   core bx- neg  . 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  Marland Kitchen BREAST CYST EXCISION  x3  . BREAST RECONSTRUCTION WITH PLACEMENT OF TISSUE EXPANDER AND FLEX HD  (ACELLULAR HYDRATED DERMIS) Left 01/27/2018   Procedure: BREAST RECONSTRUCTION WITH PLACEMENT OF TISSUE EXPANDER AND FLEX HD (ACELLULAR HYDRATED DERMIS);  Surgeon: Wallace Going, DO;  Location: [facility] ORS;  Service: Plastics;  Laterality: Left;  . BREAST SURGERY Left 02/13/14   Fibrocystic changes, pseudo-angiomatous stromal hyperplasia. No atypia or malignancy.  . CARPAL TUNNEL RELEASE  x2  . CHOLECYSTECTOMY  2008  . COLONOSCOPY WITH PROPOFOL N/A 01/12/2015   Procedure: COLONOSCOPY WITH PROPOFOL;  Surgeon: Manya Silvas, MD;  Location: [facility] ENDOSCOPY;  Service: Endoscopy;  Laterality: N/A;  . ESOPHAGOGASTRODUODENOSCOPY (EGD) WITH PROPOFOL N/A 01/12/2015   Procedure: ESOPHAGOGASTRODUODENOSCOPY (EGD) WITH PROPOFOL;  Surgeon: Manya Silvas, MD;  Location: [facility] ENDOSCOPY;  Service: Endoscopy;  Laterality: N/A;  . MASTECTOMY W/ SENTINEL NODE BIOPSY Left 01/27/2018   ypT1c ypN0;  MASTECTOMY WITH SENTINEL LYMPH NODE BIOPSY;  Surgeon: Robert Bellow, MD;  Location: [facility] ORS;  Service: General;  Laterality: Left;  . NASAL RECONSTRUCTION  1983  . PORTACATH PLACEMENT N/A 10/21/2017   Procedure: INSERTION PORT-A-CATH;  Surgeon: Robert Bellow, MD;  Location: [facility] ORS;  Service: General;  Laterality: N/A;  . SAVORY DILATION N/A 01/12/2015   Procedure: Azzie Almas DILATION;  Surgeon: Manya Silvas, MD;  Location: [facility]  ENDOSCOPY;  Service: Endoscopy;  Laterality: N/A;  . TONSILLECTOMY AND ADENOIDECTOMY  1956    There were no vitals filed for this visit.  Subjective Assessment - 03/31/18 1354    Subjective  Patient reports having increased tension today following saline expansion yesterday. Patient reports compliance with HEP with no questions or concer.s Pain level today 3/10, iwth increased tension    Pertinent History  Patient is a 67 year old fmale s/p masectomy with expander placement 01/27/18 following stage 1 breast cancer diagnosis 08/05/17. Patient underwent chemo treatment for 4 and a  half sessions from June-October, and had a very difficult time with chemo treatment. Patient reports no scheduled R masectomy at this time or plans to. Patient has had 3 injections so far into expander, and had 1 scheduled today, but was unable to complete this d/t increased tension under the breast, causing pain. Patient reports the plan at this time is to have injection in 2 weeks and continue injections until the end of Feb/beginning of March, when she will have implant surgery. Patient reports pain with overhead motion at the L ant thorax, and pain at night. Patient reports she has lifting restrictions to 5lb. Patient reports some nausea from oral chemotherapy, that she is coursed to continue for 5 years. Patient is retired, but reports she enjoys walking on [employer], and enjoys spending time with her grandson (48month old).  Patient reports worst pain over past week 8/10, best 2/10.     Limitations  Lifting;House hold activities    Diagnostic tests  none to date for this issue    Patient Stated Goals  Increase ROM and be able to hold and play with her grandchildren    Pain Onset  1 to 4 weeks ago         Therapeutic exercise - Pulleys AAROM abd and flex x15 Each with 3 sec hold before slow lowering; good carry over from previous session -Seated AAROM IR with towel x15 with 3sec hold with demo initially with good carry over following -Seated AAROM ER with dowel x15 with 3sec hold good carry over following demo  Manual therapy: -Superficial and deep technique utilized, including intermittent trigger pointing to L latissimus dorsi, rib intercostal musculature, and L pec minor (increased time spent here). Pt with significant TTP, mild-mod improvement s/p STM. Muscle tension noted throughout. - grade III GHJ AP mobs with patient iin supine moving into abd 3x 30sec  - Multiple bouts of PROM with focus on abd and flex with 10sec hold and increasing ROM per pt  tolerance                       PT Education - 03/31/18 1423    Education Details  Exericse form    Person(s) Educated  Patient    Methods  Explanation;Verbal cues    Comprehension  Verbalized understanding;Verbal cues required       PT Short Term Goals - 03/17/18 1537      PT SHORT TERM GOAL #1   Title  Pt will be independent with HEP in order to improve strength and decrease pain in order to improve pain-free function at home and work.        PT Long Term Goals - 03/17/18 1537      PT LONG TERM GOAL #1   Title  Pt will decrease worst pain as reported on NPRS by at least 3 points in order to demonstrate clinically significant reduction in pain.  Baseline  03/16/18 4/10    Time  8    Period  Weeks    Status  New      PT LONG TERM GOAL #2   Title  Patient will increase FOTO score to 67 to demonstrate predicted increase in functional mobility to complete ADLs    Baseline  03/16/18 50    Time  8    Period  Weeks    Status  New      PT LONG TERM GOAL #3   Title  Patient will demonstrate full active shoulder ROM in order to return to PLOF and complete ADLs    Baseline  03/16/18 see eval    Time  8    Period  Weeks    Status  New            Plan - 03/31/18 1429    Clinical Impression Statement  Patient with increased tension this session following appointment yesterday, requiring deeper pressure with manual techniques and increased grimence throughout. PT utilized self assisted ROM to allow patient to utilize range obtained through manual techniques at self selected range. Patient is able to complete all therex with accuracy following cuing from PT.     Rehab Potential  Good    Clinical Impairments Affecting Rehab Potential  (+) motivation, social support (-) age, active cancer, other comorbidities    PT Frequency  2x / week    PT Duration  8 weeks    PT Treatment/Interventions  ADLs/Self Care Home Management;Electrical Stimulation;Neuromuscular  re-education;Functional mobility training;Therapeutic exercise;Therapeutic activities;Moist Heat;Traction;Ultrasound;Manual lymph drainage;Patient/family education;Passive range of motion;Joint Manipulations;Dry needling;Manual techniques;Scar mobilization;Taping    PT Next Visit Plan  decrease muscle tension, periscapular strengthening     PT Home Exercise Plan  [id number] on medbridge    Consulted and Agree with Plan of Care  Patient       Patient will benefit from skilled therapeutic intervention in order to improve the following deficits and impairments:  Decreased endurance, Decreased mobility, Hypomobility, Improper body mechanics, Decreased range of motion, Decreased scar mobility, Postural dysfunction, Pain, Impaired UE functional use, Impaired flexibility, Increased fascial restricitons, Decreased activity tolerance, Decreased strength  Visit Diagnosis: Acute pain of left shoulder     Problem List Patient Active Problem List   Diagnosis Date Noted  . Acquired absence of breast 02/09/2018  . S/P mastectomy, left 02/02/2018  . Breast cancer ([place]) 01/27/2018  . Thrush 11/05/2017  . Goals of care, counseling/discussion 10/09/2017  . Hypokalemia 10/09/2017  . Acute renal failure (ARF) ([place]) 09/11/2017  . Diarrhea 09/11/2017  . Encounter for antineoplastic chemotherapy 09/03/2017  . Nodule of upper lobe of right lung 08/24/2017  . Osteopenia 08/24/2017  . Malignant neoplasm of left female breast ([place]) 08/07/2017  . Invasive ductal carcinoma of left breast ([place]) 08/05/2017  . Incomplete emptying of bladder 09/03/2016  . SUI (stress urinary incontinence, female) 09/03/2016  . Mass of right breast 02/14/2014  . Mass of upper outer quadrant of left breast 01/20/2014  . Arthritis 12/28/2013  . Asthma without status asthmaticus 12/28/2013  . Esophageal reflux 12/28/2013  . Hyperlipidemia 12/28/2013  . Hypertension 05/09/2003   Shelton Silvas PT, DPT Shelton Silvas 03/31/2018,  2:39 PM  [facility] [address], [place], [address] Phone: [phone]   Fax:  [phone]  Name: Mariah Hogan MRN: [id number] Date of Birth: [birth year]

## 2018-04-05 ENCOUNTER — Encounter: Payer: Self-pay | Admitting: Physical Therapy

## 2018-04-05 ENCOUNTER — Ambulatory Visit: Payer: Medicare Other | Admitting: Physical Therapy

## 2018-04-05 DIAGNOSIS — M25512 Pain in left shoulder: Secondary | ICD-10-CM

## 2018-04-05 NOTE — Therapy (Signed)
[place] [facility] [address], [place], [address] Phone: [phone]   Fax:  [phone]  Physical Therapy Treatment  Patient Details  Name: Mariah Hogan MRN: [id number] Date of Birth: 1950-04-17 Referring Provider (PT): Dillingham   Encounter Date: 04/05/2018  PT End of Session - 04/05/18 1416    Visit Number  6    Number of Visits  17    Date for PT Re-Evaluation  05/11/18    PT Start Time  0145    PT Stop Time  0230    PT Time Calculation (min)  45 min    Activity Tolerance  Patient tolerated treatment well    Behavior During Therapy  [facility] for tasks assessed/performed       Past Medical History:  Diagnosis Date  . Anemia   . Arthritis   . Asthma    H/O YEARS AGO-NOW HAS COUGHING SPELLS WHICH IS WHY SHE HAS HER ALBUTEROL INHALER  . Breast cancer ([place]) 08/04/2017   1.8 cm ER 90%, PR 20%, HER-2/neu negative invasive mammary carcinoma of the left upper outer quadrant.  MammaPrint: High risk.  . Cancer ([place])    [phone] basal cell carcinoma  . Complication of anesthesia    HARD TIME WAKING UP  . Dysrhythmia    TACHYCARDIA  . GERD (gastroesophageal reflux disease)   . History of hiatal hernia    SMALL  . Hyperlipidemia   . Hypertension    PT STATES IN 01-18-18 PREOP PHONE INTERVIEW THAT HER BP HAS BEEN ELEVATED SINCE STARTING ON VALSARTAN- PCP SWITCHED PT BACK TO MAXIDE ON 01-18-18 AND PT STATES THAT SHE HAS LOST 6-10 POUNDS OF FLUID SINCE RESTARTING MAXIDE.    Marland Kitchen PONV (postoperative nausea and vomiting)     Past Surgical History:  Procedure Laterality Date  . ABDOMINAL HYSTERECTOMY  1998  . bladder tack  3790,[id number]  . BREAST BIOPSY Bilateral   . BREAST BIOPSY Left 1990  . BREAST BIOPSY Left 2018   core bx- neg  . 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  Marland Kitchen BREAST CYST EXCISION  x3  . BREAST RECONSTRUCTION WITH PLACEMENT OF TISSUE EXPANDER AND FLEX HD  (ACELLULAR HYDRATED DERMIS) Left 01/27/2018   Procedure: BREAST RECONSTRUCTION WITH PLACEMENT OF TISSUE EXPANDER AND FLEX HD (ACELLULAR HYDRATED DERMIS);  Surgeon: Wallace Going, DO;  Location: [facility] ORS;  Service: Plastics;  Laterality: Left;  . BREAST SURGERY Left 02/13/14   Fibrocystic changes, pseudo-angiomatous stromal hyperplasia. No atypia or malignancy.  . CARPAL TUNNEL RELEASE  x2  . CHOLECYSTECTOMY  2008  . COLONOSCOPY WITH PROPOFOL N/A 01/12/2015   Procedure: COLONOSCOPY WITH PROPOFOL;  Surgeon: Manya Silvas, MD;  Location: [facility] ENDOSCOPY;  Service: Endoscopy;  Laterality: N/A;  . ESOPHAGOGASTRODUODENOSCOPY (EGD) WITH PROPOFOL N/A 01/12/2015   Procedure: ESOPHAGOGASTRODUODENOSCOPY (EGD) WITH PROPOFOL;  Surgeon: Manya Silvas, MD;  Location: [facility] ENDOSCOPY;  Service: Endoscopy;  Laterality: N/A;  . MASTECTOMY W/ SENTINEL NODE BIOPSY Left 01/27/2018   ypT1c ypN0;  MASTECTOMY WITH SENTINEL LYMPH NODE BIOPSY;  Surgeon: Robert Bellow, MD;  Location: [facility] ORS;  Service: General;  Laterality: Left;  . NASAL RECONSTRUCTION  1983  . PORTACATH PLACEMENT N/A 10/21/2017   Procedure: INSERTION PORT-A-CATH;  Surgeon: Robert Bellow, MD;  Location: [facility] ORS;  Service: General;  Laterality: N/A;  . SAVORY DILATION N/A 01/12/2015   Procedure: Azzie Almas DILATION;  Surgeon: Manya Silvas, MD;  Location: [facility]  ENDOSCOPY;  Service: Endoscopy;  Laterality: N/A;  . TONSILLECTOMY AND ADENOIDECTOMY  1956    There were no vitals filed for this visit.  Subjective Assessment - 04/05/18 1351    Subjective  Patient reports soreness following last session that has persisted over the weekend. Patient reports 3/10 pain, more superior to breast and "in the armpit" with less pain inferior to breast.     Pertinent History  Patient is a 68 year old fmale s/p masectomy with expander placement 01/27/18 following stage 1 breast cancer diagnosis 08/05/17. Patient underwent chemo treatment for 4 and a half  sessions from June-October, and had a very difficult time with chemo treatment. Patient reports no scheduled R masectomy at this time or plans to. Patient has had 3 injections so far into expander, and had 1 scheduled today, but was unable to complete this d/t increased tension under the breast, causing pain. Patient reports the plan at this time is to have injection in 2 weeks and continue injections until the end of Feb/beginning of March, when she will have implant surgery. Patient reports pain with overhead motion at the L ant thorax, and pain at night. Patient reports she has lifting restrictions to 5lb. Patient reports some nausea from oral chemotherapy, that she is coursed to continue for 5 years. Patient is retired, but reports she enjoys walking on [employer], and enjoys spending time with her grandson (62month old).  Patient reports worst pain over past week 8/10, best 2/10.     Limitations  Lifting;House hold activities    Diagnostic tests  none to date for this issue    Patient Stated Goals  Increase ROM and be able to hold and play with her grandchildren    Pain Onset  1 to 4 weeks ago         Therapeutic exercise - Pulleys AAROM abd and flex x15 Each with 3 sec hold before slow lowering; good carry over from previous session -Seated scapular retractions x15 with cuing for proper scapular contraction 3sec hold -Seated rows 2x 10 RTB with min cuing to prevent shoulder hiking - Doorway pec stretch 2x 30sec hold (90/90) position - Education on self pec massage with hand and tennis ball at wall with demonstration and return demo from patient  Manual therapy: -Superficial and deep technique utilized, including intermittent trigger pointing to L latissimus dorsi, rib intercostal musculature, and L pec minor/major (increased time spent at pec minor/major). Pt with significant TTP, mild-mod improvement s/p STM. Muscle tension noted throughout. - grade III GHJ AP mobs with patient iin  supine moving into abd 3x 30sec  - Multiple bouts of PROM with focus on abd and flex with 10sec hold and increasing ROM per pt tolerance. Full ROM following, with tension/pain at end range flex                       PT Education - 04/05/18 1415    Education Details  Exercise form    Person(s) Educated  Patient    Methods  Explanation;Demonstration;Verbal cues    Comprehension  Verbalized understanding;Returned demonstration;Verbal cues required       PT Short Term Goals - 03/17/18 1537      PT SHORT TERM GOAL #1   Title  Pt will be independent with HEP in order to improve strength and decrease pain in order to improve pain-free function at home and work.        PT Long Term Goals - 03/17/18  [place] #1   Title  Pt will decrease worst pain as reported on NPRS by at least 3 points in order to demonstrate clinically significant reduction in pain.    Baseline  03/16/18 4/10    Time  8    Period  Weeks    Status  New      PT LONG TERM GOAL #2   Title  Patient will increase FOTO score to 67 to demonstrate predicted increase in functional mobility to complete ADLs    Baseline  03/16/18 50    Time  8    Period  Weeks    Status  New      PT LONG TERM GOAL #3   Title  Patient will demonstrate full active shoulder ROM in order to return to PLOF and complete ADLs    Baseline  03/16/18 see eval    Time  8    Period  Weeks    Status  New            Plan - 04/05/18 1602    Clinical Impression Statement  Patient continues to have some increased tension following increasing fluid injection, with less tension at rib intercostals. PT and patient disucessed stretching to tolerance and self massage to increase carry over from manual techniques, to hopefully allow for more strength training next sesison.     Rehab Potential  Good    Clinical Impairments Affecting Rehab Potential  (+) motivation, social support (-) age, active cancer, other comorbidities     PT Frequency  2x / week    PT Duration  8 weeks    PT Treatment/Interventions  ADLs/Self Care Home Management;Electrical Stimulation;Neuromuscular re-education;Functional mobility training;Therapeutic exercise;Therapeutic activities;Moist Heat;Traction;Ultrasound;Manual lymph drainage;Patient/family education;Passive range of motion;Joint Manipulations;Dry needling;Manual techniques;Scar mobilization;Taping    PT Next Visit Plan  decrease muscle tension, periscapular strengthening     PT Home Exercise Plan  [id number] on medbridge    Consulted and Agree with Plan of Care  Patient       Patient will benefit from skilled therapeutic intervention in order to improve the following deficits and impairments:  Decreased endurance, Decreased mobility, Hypomobility, Improper body mechanics, Decreased range of motion, Decreased scar mobility, Postural dysfunction, Pain, Impaired UE functional use, Impaired flexibility, Increased fascial restricitons, Decreased activity tolerance, Decreased strength  Visit Diagnosis: Acute pain of left shoulder     Problem List Patient Active Problem List   Diagnosis Date Noted  . Acquired absence of breast 02/09/2018  . S/P mastectomy, left 02/02/2018  . Breast cancer ([place]) 01/27/2018  . Thrush 11/05/2017  . Goals of care, counseling/discussion 10/09/2017  . Hypokalemia 10/09/2017  . Acute renal failure (ARF) ([place]) 09/11/2017  . Diarrhea 09/11/2017  . Encounter for antineoplastic chemotherapy 09/03/2017  . Nodule of upper lobe of right lung 08/24/2017  . Osteopenia 08/24/2017  . Malignant neoplasm of left female breast ([place]) 08/07/2017  . 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 Incomplete emptying of bladder 09/03/2016  . SUI (stress urinary incontinence, female) 09/03/2016  . Mass of right breast 02/14/2014  . Mass of upper outer quadrant of left breast 01/20/2014  . Arthritis 12/28/2013  . Asthma without status asthmaticus  12/28/2013  . Esophageal reflux 12/28/2013  . Hyperlipidemia 12/28/2013  . Hypertension 05/09/2003   Shelton Silvas PT, DPT Shelton Silvas 04/05/2018, 5:11 PM  [place] [facility] [address] [place], [place],  [place] Phone: [phone]   Fax:  [phone]  Name: ALAJA GOLDINGER MRN: [id number] Date of Birth: [birth year]

## 2018-04-06 ENCOUNTER — Encounter: Payer: Self-pay | Admitting: Physical Therapy

## 2018-04-07 ENCOUNTER — Ambulatory Visit: Payer: Medicare Other | Admitting: Physical Therapy

## 2018-04-07 ENCOUNTER — Encounter: Payer: Self-pay | Admitting: Physical Therapy

## 2018-04-07 DIAGNOSIS — M25512 Pain in left shoulder: Secondary | ICD-10-CM | POA: Diagnosis not present

## 2018-04-07 NOTE — Therapy (Signed)
[place] [facility] [address], [place], [address] Phone: [phone]   Fax:  [phone]  Physical Therapy Treatment  Patient Details  Name: Mariah Hogan MRN: [id number] Date of Birth: 1950-08-18 Referring Provider (PT): Dillingham   Encounter Date: 04/07/2018  PT End of Session - 04/07/18 1410    Visit Number  7    Number of Visits  17    Date for PT Re-Evaluation  05/11/18    PT Start Time  0250    PT Stop Time  0330    PT Time Calculation (min)  40 min    Activity Tolerance  Patient tolerated treatment well    Behavior During Therapy  [facility] for tasks assessed/performed       Past Medical History:  Diagnosis Date  . Anemia   . Arthritis   . Asthma    H/O YEARS AGO-NOW HAS COUGHING SPELLS WHICH IS WHY SHE HAS HER ALBUTEROL INHALER  . Breast cancer ([place]) 08/04/2017   1.8 cm ER 90%, PR 20%, HER-2/neu negative invasive mammary carcinoma of the left upper outer quadrant.  MammaPrint: High risk.  . Cancer ([place])    [phone] basal cell carcinoma  . Complication of anesthesia    HARD TIME WAKING UP  . Dysrhythmia    TACHYCARDIA  . GERD (gastroesophageal reflux disease)   . History of hiatal hernia    SMALL  . Hyperlipidemia   . Hypertension    PT STATES IN 01-18-18 PREOP PHONE INTERVIEW THAT HER BP HAS BEEN ELEVATED SINCE STARTING ON VALSARTAN- PCP SWITCHED PT BACK TO MAXIDE ON 01-18-18 AND PT STATES THAT SHE HAS LOST 6-10 POUNDS OF FLUID SINCE RESTARTING MAXIDE.    Marland Kitchen PONV (postoperative nausea and vomiting)     Past Surgical History:  Procedure Laterality Date  . ABDOMINAL HYSTERECTOMY  1998  . bladder tack  [id number]  . BREAST BIOPSY Bilateral   . BREAST BIOPSY Left 1990  . BREAST BIOPSY Left 2018   core bx- neg  . 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  Marland Kitchen BREAST CYST EXCISION  x3  . BREAST RECONSTRUCTION WITH PLACEMENT OF TISSUE EXPANDER AND FLEX HD  (ACELLULAR HYDRATED DERMIS) Left 01/27/2018   Procedure: BREAST RECONSTRUCTION WITH PLACEMENT OF TISSUE EXPANDER AND FLEX HD (ACELLULAR HYDRATED DERMIS);  Surgeon: Wallace Going, DO;  Location: [facility] ORS;  Service: Plastics;  Laterality: Left;  . BREAST SURGERY Left 02/13/14   Fibrocystic changes, pseudo-angiomatous stromal hyperplasia. No atypia or malignancy.  . CARPAL TUNNEL RELEASE  x2  . CHOLECYSTECTOMY  2008  . COLONOSCOPY WITH PROPOFOL N/A 01/12/2015   Procedure: COLONOSCOPY WITH PROPOFOL;  Surgeon: Manya Silvas, MD;  Location[facility] ENDOSCOPY;  Service: Endoscopy;  Laterality: N/A;  . ESOPHAGOGASTRODUODENOSCOPY (EGD) WITH PROPOFOL N/A 01/12/2015   Procedure: ESOPHAGOGASTRODUODENOSCOPY (EGD) WITH PROPOFOL;  Surgeon: Manya Silvas, MD;  Location: [facility] ENDOSCOPY;  Service: Endoscopy;  Laterality: N/A;  . MASTECTOMY W/ SENTINEL NODE BIOPSY Left 01/27/2018   ypT1c ypN0;  MASTECTOMY WITH SENTINEL LYMPH NODE BIOPSY;  Surgeon: Robert Bellow, MD;  Location: [facility] ORS;  Service: General;  Laterality: Left;  . NASAL RECONSTRUCTION  1983  . PORTACATH PLACEMENT N/A 10/21/2017   Procedure: INSERTION PORT-A-CATH;  Surgeon: Robert Bellow, MD;  Location: [facility] ORS;  Service: General;  Laterality: N/A;  . SAVORY DILATION N/A 01/12/2015   Procedure: Azzie Almas DILATION;  Surgeon: Manya Silvas, MD;  [facility]  ENDOSCOPY;  Service: Endoscopy;  Laterality: N/A;  . TONSILLECTOMY AND ADENOIDECTOMY  1956    There were no vitals filed for this visit.  Subjective Assessment - 04/07/18 1353    Subjective  Patient reports her pain is better than it has been, and is 1/10 today. Patient reports compliance with HEP with no questions or concerns.     Pertinent History  Patient is a 68 year old fmale s/p masectomy with expander placement 01/27/18 following stage 1 breast cancer diagnosis 08/05/17. Patient underwent chemo treatment for 4 and a half sessions from June-October, and had a very difficult  time with chemo treatment. Patient reports no scheduled R masectomy at this time or plans to. Patient has had 3 injections so far into expander, and had 1 scheduled today, but was unable to complete this d/t increased tension under the breast, causing pain. Patient reports the plan at this time is to have injection in 2 weeks and continue injections until the end of Feb/beginning of March, when she will have implant surgery. Patient reports pain with overhead motion at the L ant thorax, and pain at night. Patient reports she has lifting restrictions to 5lb. Patient reports some nausea from oral chemotherapy, that she is coursed to continue for 5 years. Patient is retired, but reports she enjoys walking on [employer], and enjoys spending time with her grandson (30month old).  Patient reports worst pain over past week 8/10, best 2/10.     Limitations  Lifting;House hold activities    Diagnostic tests  none to date for this issue    Patient Stated Goals  Increase ROM and be able to hold and play with her grandchildren    Pain Onset  1 to 4 weeks ago         Therapeutic exercise - Pulleys AAROM abd and flex x15 Each with 3 sec hold before slow lowering; good carry over from previous session -Standing rows 5# 3x 10 with TC initially for proper posture and scapular retraction with good carry over following - Standing IR GTB and ER GTB 3x 10 each position with cuing for set up and eccentric control with good carry over following - addition of rows with GTB to HEP with handout given and education on strengthening parameters   Manual therapy: -Superficial and deep technique utilized, including intermittent trigger pointing to L latissimus dorsi/teres minor, L bicep  and L pec minor/major (decreased time spent at rib intercostals with d/t decreased tension . Pt with significant TTP, mild-mod improvement s/p STM. Muscle tension noted throughout. - gradeIIIGHJ AP mobs with patient iin supine moving into  abd 3x 30sec - Multiple bouts of PROM with focus on abd and flex with 10sec hold and increasing ROM per pt tolerance. Full ROM following, with tension/pain at end range flex                       PT Education - 04/07/18 1354    Education Details  exercise form    Person(s) Educated  Patient    Methods  Explanation;Demonstration;Verbal cues;Tactile cues    Comprehension  Verbalized understanding;Returned demonstration;Verbal cues required       PT Short Term Goals - 03/17/18 1537      PT SHORT TERM GOAL #1   Title  Pt will be independent with HEP in order to improve strength and decrease pain in order to improve pain-free function at home and work.        PT  Long Term Goals - 03/17/18 1537      PT LONG TERM GOAL #1   Title  Pt will decrease worst pain as reported on NPRS by at least 3 points in order to demonstrate clinically significant reduction in pain.    Baseline  03/16/18 4/10    Time  8    Period  Weeks    Status  New      PT LONG TERM GOAL #2   Title  Patient will increase FOTO score to 67 to demonstrate predicted increase in functional mobility to complete ADLs    Baseline  03/16/18 50    Time  8    Period  Weeks    Status  New      PT LONG TERM GOAL #3   Title  Patient will demonstrate full active shoulder ROM in order to return to PLOF and complete ADLs    Baseline  03/16/18 see eval    Time  8    Period  Weeks    Status  New            Plan - 04/07/18 1733    Clinical Impression Statement  Patient is responding well to manual techniques, with full active ROM following allowing for more therex progression.  Patient demonstrating good carry over of proper posture, with accuracy following PT cuing with therex.     Rehab Potential  Good    Clinical Impairments Affecting Rehab Potential  (+) motivation, social support (-) age, active cancer, other comorbidities    PT Frequency  2x / week    PT Duration  8 weeks    PT Treatment/Interventions   ADLs/Self Care Home Management;Electrical Stimulation;Neuromuscular re-education;Functional mobility training;Therapeutic exercise;Therapeutic activities;Moist Heat;Traction;Ultrasound;Manual lymph drainage;Patient/family education;Passive range of motion;Joint Manipulations;Dry needling;Manual techniques;Scar mobilization;Taping    PT Next Visit Plan  decrease muscle tension, periscapular strengthening     PT Home Exercise Plan  [id number] on medbridge    Consulted and Agree with Plan of Care  Patient       Patient will benefit from skilled therapeutic intervention in order to improve the following deficits and impairments:  Decreased endurance, Decreased mobility, Hypomobility, Improper body mechanics, Decreased range of motion, Decreased scar mobility, Postural dysfunction, Pain, Impaired UE functional use, Impaired flexibility, Increased fascial restricitons, Decreased activity tolerance, Decreased strength  Visit Diagnosis: Acute pain of left shoulder     Problem List Patient Active Problem List   Diagnosis Date Noted  . Acquired absence of breast 02/09/2018  . S/P mastectomy, left 02/02/2018  . Breast cancer ([place]) 01/27/2018  . Thrush 11/05/2017  . Goals of care, counseling/discussion 10/09/2017  . Hypokalemia 10/09/2017  . Acute renal failure (ARF) ([place]) 09/11/2017  . Diarrhea 09/11/2017  . Encounter for antineoplastic chemotherapy 09/03/2017  . Nodule of upper lobe of right lung 08/24/2017  . Osteopenia 08/24/2017  . Malignant neoplasm of left female breast ([place]) 08/07/2017  . Invasive ductal carcinoma of left breast ([place]) 08/05/2017  . Incomplete emptying of bladder 09/03/2016  . SUI (stress urinary incontinence, female) 09/03/2016  . Mass of right breast 02/14/2014  . Mass of upper outer quadrant of left breast 01/20/2014  . Arthritis 12/28/2013  . Asthma without status asthmaticus 12/28/2013  . Esophageal reflux 12/28/2013  . Hyperlipidemia 12/28/2013  . Hypertension  05/09/2003   Shelton Silvas PT, DPT Shelton Silvas 04/07/2018, 5:34 PM  [place] [facility] [facility] [address], [place], [address] Phone: [phone]  Fax:  [phone]  Name: KAO BERKHEIMER MRN: [id number] Date of Birth: 01-Jan-1951

## 2018-04-13 ENCOUNTER — Ambulatory Visit: Payer: Medicare Other | Admitting: Plastic Surgery

## 2018-04-13 ENCOUNTER — Ambulatory Visit: Payer: Medicare Other | Admitting: Physical Therapy

## 2018-04-16 ENCOUNTER — Ambulatory Visit: Payer: Medicare Other | Attending: Physician Assistant | Admitting: Physical Therapy

## 2018-04-16 ENCOUNTER — Encounter: Payer: Self-pay | Admitting: Physical Therapy

## 2018-04-16 DIAGNOSIS — M25512 Pain in left shoulder: Secondary | ICD-10-CM | POA: Diagnosis not present

## 2018-04-16 NOTE — Therapy (Signed)
[facility] [address] [place], [address] Phone: [phone]   Fax:  [phone]  Physical Therapy Treatment  Patient Details  Name: Mariah Hogan MRN: [id number] Date of Birth: [birth year] Referring Provider (PT): Dillingham   Encounter Date: 04/16/2018  PT End of Session - 04/16/18 1006    Visit Number  8    Number of Visits  17    Date for PT Re-Evaluation  05/11/18    PT Start Time  1000    PT Stop Time  1045    PT Time Calculation (min)  45 min    Activity Tolerance  Patient tolerated treatment well    Behavior During Therapy  [facility] for tasks assessed/performed       Past Medical History:  Diagnosis Date  . Anemia   . Arthritis   . Asthma    H/O YEARS AGO-NOW HAS COUGHING SPELLS WHICH IS WHY SHE HAS HER ALBUTEROL INHALER  . Breast cancer ([place]) 08/04/2017   1.8 cm ER 90%, PR 20%, HER-2/neu negative invasive mammary carcinoma of the left upper outer quadrant.  MammaPrint: High risk.  . Cancer ([place])    [phone] basal cell carcinoma  . Complication of anesthesia    HARD TIME WAKING UP  . Dysrhythmia    TACHYCARDIA  . GERD (gastroesophageal reflux disease)   . History of hiatal hernia    SMALL  . Hyperlipidemia   . Hypertension    PT STATES IN 01-18-18 PREOP PHONE INTERVIEW THAT HER BP HAS BEEN ELEVATED SINCE STARTING ON VALSARTAN- PCP SWITCHED PT BACK TO MAXIDE ON 01-18-18 AND PT STATES THAT SHE HAS LOST 6-10 POUNDS OF FLUID SINCE RESTARTING MAXIDE.    Marland Kitchen PONV (postoperative nausea and vomiting)     Past Surgical History:  Procedure Laterality Date  . ABDOMINAL HYSTERECTOMY  1998  . bladder tack  [id number]  . BREAST BIOPSY Bilateral   . BREAST BIOPSY Left 1990  . BREAST BIOPSY Left 2018   core bx- neg  . 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  Marland Kitchen BREAST CYST EXCISION  x3  . BREAST RECONSTRUCTION WITH PLACEMENT OF TISSUE EXPANDER AND FLEX HD  (ACELLULAR HYDRATED DERMIS) Left 01/27/2018   Procedure: BREAST RECONSTRUCTION WITH PLACEMENT OF TISSUE EXPANDER AND FLEX HD (ACELLULAR HYDRATED DERMIS);  Surgeon: Wallace Going, DO;  Location: [facility] ORS;  Service: Plastics;  Laterality: Left;  . BREAST SURGERY Left 02/13/14   Fibrocystic changes, pseudo-angiomatous stromal hyperplasia. No atypia or malignancy.  . CARPAL TUNNEL RELEASE  x2  . CHOLECYSTECTOMY  2008  . COLONOSCOPY WITH PROPOFOL N/A 01/12/2015   Procedure: COLONOSCOPY WITH PROPOFOL;  Surgeon: Manya Silvas, MD;  Location: [facility] ENDOSCOPY;  Service: Endoscopy;  Laterality: N/A;  . ESOPHAGOGASTRODUODENOSCOPY (EGD) WITH PROPOFOL N/A 01/12/2015   Procedure: ESOPHAGOGASTRODUODENOSCOPY (EGD) WITH PROPOFOL;  Surgeon: Manya Silvas, MD;  Location: [facility] ENDOSCOPY;  Service: Endoscopy;  Laterality: N/A;  . MASTECTOMY W/ SENTINEL NODE BIOPSY Left 01/27/2018   ypT1c ypN0;  MASTECTOMY WITH SENTINEL LYMPH NODE BIOPSY;  Surgeon: Robert Bellow, MD;  Location: [facility] ORS;  Service: General;  Laterality: Left;  . NASAL RECONSTRUCTION  1983  . PORTACATH PLACEMENT N/A 10/21/2017   Procedure: INSERTION PORT-A-CATH;  Surgeon: Robert Bellow, MD;  Location: [facility] ORS;  Service: General;  Laterality: N/A;  . SAVORY DILATION N/A 01/12/2015   Procedure: Azzie Almas DILATION;  Surgeon: Manya Silvas, MD;  Location: [facility]  ENDOSCOPY;  Service: Endoscopy;  Laterality: N/A;  . TONSILLECTOMY AND ADENOIDECTOMY  1956    There were no vitals filed for this visit.  Subjective Assessment - 04/16/18 1005    Subjective  Patient reports she is having minimal pain, some stiffness with overhead ROM. Patient reports compliance with HEP with no questions or concerns.     Pertinent History  Patient is a 68 year old fmale s/p masectomy with expander placement 01/27/18 following stage 1 breast cancer diagnosis 08/05/17. Patient underwent chemo treatment for 4 and a half sessions from June-October, and had a very  difficult time with chemo treatment. Patient reports no scheduled R masectomy at this time or plans to. Patient has had 3 injections so far into expander, and had 1 scheduled today, but was unable to complete this d/t increased tension under the breast, causing pain. Patient reports the plan at this time is to have injection in 2 weeks and continue injections until the end of Feb/beginning of March, when she will have implant surgery. Patient reports pain with overhead motion at the L ant thorax, and pain at night. Patient reports she has lifting restrictions to 5lb. Patient reports some nausea from oral chemotherapy, that she is coursed to continue for 5 years. Patient is retired, but reports she enjoys walking on [employer], and enjoys spending time with her grandson (35month old).  Patient reports worst pain over past week 8/10, best 2/10.     Limitations  Lifting;House hold activities    Diagnostic tests  none to date for this issue    Patient Stated Goals  Increase ROM and be able to hold and play with her grandchildren    Pain Onset  1 to 4 weeks ago       Therapeutic exercise - Pulleys AAROM abd and flex x15 Each with 3 sec hold before slow lowering; good carry over from previous session -Standing rows 10# 3x 10 with TC initially for proper posture and scapular retraction with good carry over following - Standing high rows 5# 3x 10 (attempted 10# unable with proper form) demo and max cuing initially for proper form with good carry over following  Manual therapy: -Superficial and deep technique utilized, including intermittent trigger pointing to L latissimus dorsi/teres minor, L bicep  and L pec minor/major(decreased time spent at rib intercostals with d/t decreased tension . Pt with significant TTP, mild-mod improvement s/p STM. Increased time spent at L bicep and latissimus as this is greatly prohibiting abd ROM according to patient  - gradeIIIGHJ AP mobs with patient iin supine  moving into abd 3x 30sec - Multiple bouts of PROM with focus on abd and flex with 10sec hold and increasing ROM per pt tolerance. Focus on abd. Full ROM following, with tension/pain at end range flex                        PT Education - 04/16/18 1006    Education Details  Exercise form    Person(s) Educated  Patient    Methods  Explanation;Verbal cues    Comprehension  Verbalized understanding;Verbal cues required       PT Short Term Goals - 03/17/18 1537      PT SHORT TERM GOAL #1   Title  Pt will be independent with HEP in order to improve strength and decrease pain in order to improve pain-free function at home and work.        PT Long Term Goals -  03/17/18 1537      PT LONG TERM GOAL #1   Title  Pt will decrease worst pain as reported on NPRS by at least 3 points in order to demonstrate clinically significant reduction in pain.    Baseline  03/16/18 4/10    Time  8    Period  Weeks    Status  New      PT LONG TERM GOAL #2   Title  Patient will increase FOTO score to 67 to demonstrate predicted increase in functional mobility to complete ADLs    Baseline  03/16/18 50    Time  8    Period  Weeks    Status  New      PT LONG TERM GOAL #3   Title  Patient will demonstrate full active shoulder ROM in order to return to PLOF and complete ADLs    Baseline  03/16/18 see eval    Time  8    Period  Weeks    Status  New            Plan - 04/16/18 1029    Clinical Impression Statement  Patient with increased ROM restriction in abd this session with increased tension at L bicep and latissimus/teres minor. Following manual techniques patient demonstrates full ROM with decreased tension. PT continued therex for strengthening maintainence, in patient's lifting precautions (10lbs total). PT will continue progression as able.     Rehab Potential  Good    Clinical Impairments Affecting Rehab Potential  (+) motivation, social support (-) age, active cancer, other  comorbidities    PT Frequency  2x / week    PT Duration  8 weeks    PT Treatment/Interventions  ADLs/Self Care Home Management;Electrical Stimulation;Neuromuscular re-education;Functional mobility training;Therapeutic exercise;Therapeutic activities;Moist Heat;Traction;Ultrasound;Manual lymph drainage;Patient/family education;Passive range of motion;Joint Manipulations;Dry needling;Manual techniques;Scar mobilization;Taping    PT Next Visit Plan  decrease muscle tension, periscapular strengthening     PT Home Exercise Plan  [id number] on medbridge    Consulted and Agree with Plan of Care  Patient       Patient will benefit from skilled therapeutic intervention in order to improve the following deficits and impairments:  Decreased endurance, Decreased mobility, Hypomobility, Improper body mechanics, Decreased range of motion, Decreased scar mobility, Postural dysfunction, Pain, Impaired UE functional use, Impaired flexibility, Increased fascial restricitons, Decreased activity tolerance, Decreased strength  Visit Diagnosis: Acute pain of left shoulder     Problem List Patient Active Problem List   Diagnosis Date Noted  . Acquired absence of breast 02/09/2018  . S/P mastectomy, left 02/02/2018  . Breast cancer ([place]) 01/27/2018  . Thrush 11/05/2017  . Goals of care, counseling/discussion 10/09/2017  . Hypokalemia 10/09/2017  . Acute renal failure (ARF) ([place]) 09/11/2017  . Diarrhea 09/11/2017  . Encounter for antineoplastic chemotherapy 09/03/2017  . Nodule of upper lobe of right lung 08/24/2017  . Osteopenia 08/24/2017  . Malignant neoplasm of left female breast ([place]) 08/07/2017  . 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 Incomplete emptying of bladder 09/03/2016  . SUI (stress urinary incontinence, female) 09/03/2016  . Mass of right breast 02/14/2014  . Mass of upper outer quadrant of left breast 01/20/2014  . Arthritis 12/28/2013  . Asthma without status  asthmaticus 12/28/2013  . Esophageal reflux 12/28/2013  . Hyperlipidemia 12/28/2013  . Hypertension 05/09/2003   Shelton Silvas PT, DPT Shelton Silvas 04/16/2018, 10:45 AM  [facility] [address] [place], [address] Phone: [phone]   Fax:  [phone]  Name: Mariah Hogan MRN: [id number] Date of Birth: August 16, 1950

## 2018-04-19 ENCOUNTER — Ambulatory Visit: Payer: Medicare Other | Admitting: Physical Therapy

## 2018-04-19 ENCOUNTER — Encounter: Payer: Self-pay | Admitting: Physical Therapy

## 2018-04-19 ENCOUNTER — Telehealth: Payer: Self-pay | Admitting: *Deleted

## 2018-04-19 DIAGNOSIS — M25512 Pain in left shoulder: Secondary | ICD-10-CM | POA: Diagnosis not present

## 2018-04-19 NOTE — Telephone Encounter (Signed)
Patient called and has some questions regarding on how things were coded back in 20-Feb-2018 when she had surgery

## 2018-04-19 NOTE — Therapy (Signed)
[facility] [address] [place], [address] Phone: [phone]   Fax:  [phone]  Physical Therapy Treatment  Patient Details  Name: Mariah Hogan MRN: [id number] Date of Birth: 1950/09/30 Referring Provider (PT): Dillingham   Encounter Date: 04/19/2018  PT End of Session - 04/19/18 0905    Visit Number  9    Number of Visits  17    Date for PT Re-Evaluation  05/11/18    PT Start Time  0900    PT Stop Time  0940    PT Time Calculation (min)  40 min    Activity Tolerance  Patient tolerated treatment well    Behavior During Therapy  [facility] for tasks assessed/performed       Past Medical History:  Diagnosis Date  . Anemia   . Arthritis   . Asthma    H/O YEARS AGO-NOW HAS COUGHING SPELLS WHICH IS WHY SHE HAS HER ALBUTEROL INHALER  . Breast cancer ([place]) 08/04/2017   1.8 cm ER 90%, PR 20%, HER-2/neu negative invasive mammary carcinoma of the left upper outer quadrant.  MammaPrint: High risk.  . Cancer ([place])    [phone] basal cell carcinoma  . Complication of anesthesia    HARD TIME WAKING UP  . Dysrhythmia    TACHYCARDIA  . GERD (gastroesophageal reflux disease)   . History of hiatal hernia    SMALL  . Hyperlipidemia   . Hypertension    PT STATES IN 01-18-18 PREOP PHONE INTERVIEW THAT HER BP HAS BEEN ELEVATED SINCE STARTING ON VALSARTAN- PCP SWITCHED PT BACK TO MAXIDE ON 01-18-18 AND PT STATES THAT SHE HAS LOST 6-10 POUNDS OF FLUID SINCE RESTARTING MAXIDE.    Marland Kitchen PONV (postoperative nausea and vomiting)     Past Surgical History:  Procedure Laterality Date  . ABDOMINAL HYSTERECTOMY  1998  . bladder tack  [id number]  . BREAST BIOPSY Bilateral   . BREAST BIOPSY Left 1990  . BREAST BIOPSY Left 2018   core bx- neg  . 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  Marland Kitchen BREAST CYST EXCISION  x3  . BREAST RECONSTRUCTION WITH PLACEMENT OF TISSUE EXPANDER AND FLEX HD  (ACELLULAR HYDRATED DERMIS) Left 01/27/2018   Procedure: BREAST RECONSTRUCTION WITH PLACEMENT OF TISSUE EXPANDER AND FLEX HD (ACELLULAR HYDRATED DERMIS);  Surgeon: Wallace Going, DO;  Location: [facility] ORS;  Service: Plastics;  Laterality: Left;  . BREAST SURGERY Left 02/13/14   Fibrocystic changes, pseudo-angiomatous stromal hyperplasia. No atypia or malignancy.  . CARPAL TUNNEL RELEASE  x2  . CHOLECYSTECTOMY  2008  . COLONOSCOPY WITH PROPOFOL N/A 01/12/2015   Procedure: COLONOSCOPY WITH PROPOFOL;  Surgeon: Manya Silvas, MD;  Location: [facility] ENDOSCOPY;  Service: Endoscopy;  Laterality: N/A;  . ESOPHAGOGASTRODUODENOSCOPY (EGD) WITH PROPOFOL N/A 01/12/2015   Procedure: ESOPHAGOGASTRODUODENOSCOPY (EGD) WITH PROPOFOL;  Surgeon: Manya Silvas, MD;  Location: [facility] ENDOSCOPY;  Service: Endoscopy;  Laterality: N/A;  . MASTECTOMY W/ SENTINEL NODE BIOPSY Left 01/27/2018   ypT1c ypN0;  MASTECTOMY WITH SENTINEL LYMPH NODE BIOPSY;  Surgeon: Robert Bellow, MD;  Location: [facility] ORS;  Service: General;  Laterality: Left;  . NASAL RECONSTRUCTION  1983  . PORTACATH PLACEMENT N/A 10/21/2017   Procedure: INSERTION PORT-A-CATH;  Surgeon: Robert Bellow, MD;  Location: [facility] ORS;  Service: General;  Laterality: N/A;  . SAVORY DILATION N/A 01/12/2015   Procedure: Azzie Almas DILATION;  Surgeon: Manya Silvas, MD;  Location: [facility]  ENDOSCOPY;  Service: Endoscopy;  Laterality: N/A;  . TONSILLECTOMY AND ADENOIDECTOMY  1956    There were no vitals filed for this visit.  Subjective Assessment - 04/19/18 0904    Subjective  Patient reports no pain this am. Patient reports her motion has been good over the weekend.     Pertinent History  Patient is a 68 year old fmale s/p masectomy with expander placement 01/27/18 following stage 1 breast cancer diagnosis 08/05/17. Patient underwent chemo treatment for 4 and a half sessions from June-October, and had a very difficult time with chemo treatment. Patient reports no  scheduled R masectomy at this time or plans to. Patient has had 3 injections so far into expander, and had 1 scheduled today, but was unable to complete this d/t increased tension under the breast, causing pain. Patient reports the plan at this time is to have injection in 2 weeks and continue injections until the end of Feb/beginning of March, when she will have implant surgery. Patient reports pain with overhead motion at the L ant thorax, and pain at night. Patient reports she has lifting restrictions to 5lb. Patient reports some nausea from oral chemotherapy, that she is coursed to continue for 5 years. Patient is retired, but reports she enjoys walking on [employer], and enjoys spending time with her grandson (11month old).  Patient reports worst pain over past week 8/10, best 2/10.     Limitations  Lifting;House hold activities    Diagnostic tests  none to date for this issue    Patient Stated Goals  Increase ROM and be able to hold and play with her grandchildren    Pain Onset  1 to 4 weeks ago         Therapeutic exercise - Pulleys AAROM abd and flex x15 Each with 3 sec hold before slow lowering; good carry over from previous session - Standing ER and IR with RTB 3x 10 (attempted with 5#, unable) with TC initially for set up with good carry over following - Scaption raise 1# DB (attempted 2# unable with proper form) 3x 10 with max TC initially for scapular control and proper form with good carry over following -Standing rows 5# (attempted 10#, unable with proper form) 3x 10 with TC initially for proper posture and scapular retraction with good carry over following   Manual therapy: -Superficial and deep technique utilized, including intermittent trigger pointing to L latissimus dorsi/teres minor, L bicepand L pec minor/major(decreased time spent at rib intercostals with d/t decreased tension, increased time spent at latissimus/teres minor). Pt with significant TTP, mild-mod  improvement s/p STM. Increased time spent at L bicep and latissimus as this is greatly prohibiting abd ROM according to patient, especially in end range flexion - Multiple bouts of PROM with focus on abd and flex with 10sec hold and increasing ROM per pt tolerance. Focus on flex and ER. Full ROM following, with tension/pain at end range flex                       PT Education - 04/19/18 0905    Education Details  Exercise form    Person(s) Educated  Patient    Methods  Explanation;Demonstration;Verbal cues    Comprehension  Verbalized understanding;Returned demonstration;Verbal cues required       PT Short Term Goals - 03/17/18 1537      PT SHORT TERM GOAL #1   Title  Pt will be independent with HEP in order to improve  strength and decrease pain in order to improve pain-free function at home and work.        PT Long Term Goals - 03/17/18 1537      PT LONG TERM GOAL #1   Title  Pt will decrease worst pain as reported on NPRS by at least 3 points in order to demonstrate clinically significant reduction in pain.    Baseline  03/16/18 4/10    Time  8    Period  Weeks    Status  New      PT LONG TERM GOAL #2   Title  Patient will increase FOTO score to 67 to demonstrate predicted increase in functional mobility to complete ADLs    Baseline  03/16/18 50    Time  8    Period  Weeks    Status  New      PT LONG TERM GOAL #3   Title  Patient will demonstrate full active shoulder ROM in order to return to PLOF and complete ADLs    Baseline  03/16/18 see eval    Time  8    Period  Weeks    Status  New            Plan - 04/19/18 1145    Clinical Impression Statement  PT continued progression within precautions for increased ROM and strengthening. patient is scheduled for last injection tomorrow before expansion removal 3/5. PT encouraged patient to continue motion at home for maintainence of PT session gains. PT will follow up with patient post injection.      Rehab Potential  Good    Clinical Impairments Affecting Rehab Potential  (+) motivation, social support (-) age, active cancer, other comorbidities    PT Frequency  2x / week    PT Duration  8 weeks    PT Treatment/Interventions  ADLs/Self Care Home Management;Electrical Stimulation;Neuromuscular re-education;Functional mobility training;Therapeutic exercise;Therapeutic activities;Moist Heat;Traction;Ultrasound;Manual lymph drainage;Patient/family education;Passive range of motion;Joint Manipulations;Dry needling;Manual techniques;Scar mobilization;Taping    PT Next Visit Plan  decrease muscle tension, periscapular strengthening     PT Home Exercise Plan  [id number] on medbridge    Consulted and Agree with Plan of Care  Patient       Patient will benefit from skilled therapeutic intervention in order to improve the following deficits and impairments:  Decreased endurance, Decreased mobility, Hypomobility, Improper body mechanics, Decreased range of motion, Decreased scar mobility, Postural dysfunction, Pain, Impaired UE functional use, Impaired flexibility, Increased fascial restricitons, Decreased activity tolerance, Decreased strength  Visit Diagnosis: Acute pain of left shoulder     Problem List Patient Active Problem List   Diagnosis Date Noted  . Acquired absence of breast 02/09/2018  . S/P mastectomy, left 02/02/2018  . Breast cancer ([place]) 01/27/2018  . Thrush 11/05/2017  . Goals of care, counseling/discussion 10/09/2017  . Hypokalemia 10/09/2017  . Acute renal failure (ARF) ([place]) 09/11/2017  . Diarrhea 09/11/2017  . Encounter for antineoplastic chemotherapy 09/03/2017  . Nodule of upper lobe of right lung 08/24/2017  . Osteopenia 08/24/2017  . Malignant neoplasm of left female breast ([place]) 08/07/2017  . Invasive ductal carcinoma of left breast ([place]) 08/05/2017  . Incomplete emptying of bladder 09/03/2016  . SUI (stress urinary incontinence, female) 09/03/2016  . Mass of  right breast 02/14/2014  . Mass of upper outer quadrant of left breast 01/20/2014  . Arthritis 12/28/2013  . Asthma without status asthmaticus 12/28/2013  . Esophageal reflux 12/28/2013  . Hyperlipidemia 12/28/2013  . Hypertension 05/09/2003  Shelton Silvas PT, DPT Shelton Silvas 04/19/2018, 11:47 AM  [facility] [address] [place], [address] Phone: [phone]   Fax:  [phone]  Name: Mariah Hogan MRN: [id number] Date of Birth: 09-30-1950

## 2018-04-20 ENCOUNTER — Ambulatory Visit (INDEPENDENT_AMBULATORY_CARE_PROVIDER_SITE_OTHER): Payer: Medicare Other | Admitting: Physician Assistant

## 2018-04-20 ENCOUNTER — Encounter: Payer: Medicare Other | Admitting: Physical Therapy

## 2018-04-20 ENCOUNTER — Encounter: Payer: Self-pay | Admitting: Physician Assistant

## 2018-04-20 VITALS — BP 122/78 | HR 76 | Ht 66.0 in | Wt 160.0 lb

## 2018-04-20 DIAGNOSIS — Z9012 Acquired absence of left breast and nipple: Secondary | ICD-10-CM

## 2018-04-20 MED ORDER — CIPROFLOXACIN HCL 500 MG PO TABS: 500.0000 mg | ORAL_TABLET | Freq: Two times a day (BID) | ORAL | 0 refills | Status: AC

## 2018-04-20 MED ORDER — ONDANSETRON HCL 4 MG PO TABS: 4.0000 mg | ORAL_TABLET | Freq: Three times a day (TID) | ORAL | 0 refills | Status: AC | PRN

## 2018-04-20 MED ORDER — HYDROCODONE-ACETAMINOPHEN 5-325 MG PO TABS: 1.0000 | ORAL_TABLET | Freq: Four times a day (QID) | ORAL | 0 refills | Status: AC | PRN

## 2018-04-20 MED ORDER — DIAZEPAM 2 MG PO TABS: 2.0000 mg | ORAL_TABLET | Freq: Three times a day (TID) | ORAL | 0 refills | Status: AC | PRN

## 2018-04-20 NOTE — Progress Notes (Signed)
  Subjective:     Patient ID: Mariah Hogan, female   DOB: [birth year], 68 y.o.   MRN: [id number]  HPI Very pleasant 68 year old female pt presents to the clinic for her final fill of the left breast expander before the exchange.  After discussion she and Dr. Marla Roe do not feel like she needs a lift on the right.  The pts goal is to be able to fit into her size 36C victorias secret bras.  The pt reports overall a hx of good cardiovascular and pulmonary health.  She denies DM.     Review of Systems  Constitutional: Negative.   Respiratory: Negative.   Cardiovascular: Negative.   Gastrointestinal: Negative.   Skin: Negative.   Psychiatric/Behavioral: Negative.        Objective:   Physical Exam Constitutional:      Appearance: Normal appearance.  Cardiovascular:     Rate and Rhythm: Normal rate and regular rhythm.     Heart sounds: Normal heart sounds.  Pulmonary:     Effort: Pulmonary effort is normal.     Breath sounds: Normal breath sounds.  Abdominal:     General: Abdomen is flat.     Palpations: Abdomen is soft.  Skin:    General: Skin is warm and dry.  Neurological:     Mental Status: She is alert and oriented to person, place, and time.  Psychiatric:        Mood and Affect: Mood normal.        Behavior: Behavior normal.        Thought Content: Thought content normal.        Judgment: Judgment normal.        Assessment:     S/p mastectomy and expander placement H & P for exchange of Left expander for Implant    Plan:     We placed injectable saline in the Expander using a sterile technique:  Left: 50 cc for a total of 400 / 450 cc  The consent was obtained with risks and complications reviewed which included bleeding, pain, scar, infection and the risk of anesthesia.  The patients questions were answered to the patients expressed satisfaction.

## 2018-04-21 ENCOUNTER — Encounter: Payer: Self-pay | Admitting: Physical Therapy

## 2018-04-21 ENCOUNTER — Ambulatory Visit: Payer: Medicare Other | Admitting: Physical Therapy

## 2018-04-21 DIAGNOSIS — M25512 Pain in left shoulder: Secondary | ICD-10-CM

## 2018-04-21 NOTE — Telephone Encounter (Signed)
I have called patient back. Patient's questions were answered. She wanted to know if she was billed as observation or inpatient. She was billed as observation. Patient was unaware that if the stay was less than 23 hours that the billing would be observation and not inpatient. Also there was no order for patient to be admitted and the case was also posted as ambulatory services.   Patient is requesting her medical records for the surgery date of 01/27/18 as well as her pathology report. She has a visit with Dr Bary Castilla tomorrow. I did inform her that she would have to sign a medical release when she picked them up.

## 2018-04-21 NOTE — Therapy (Signed)
[place]  [facility] [address] [place], [place], [address] Phone: [phone]   Fax:  [phone]  Physical Therapy Treatment  Patient Details  Name: Mariah Hogan MRN: [id number] Date of Birth: 04/04/1950 Referring Provider (PT): Dillingham   Encounter Date: 04/21/2018  PT End of Session - 04/21/18 1417    Visit Number  10    Number of Visits  17    Date for PT Re-Evaluation  05/11/18    PT Start Time  0145    PT Stop Time  0230    PT Time Calculation (min)  45 min    Activity Tolerance  Patient tolerated treatment well    Behavior During Therapy  WFL for tasks assessed/performed       Past Medical History:  Diagnosis Date  . Anemia   . Arthritis   . Asthma    H/O YEARS AGO-NOW HAS COUGHING SPELLS WHICH IS WHY SHE HAS HER ALBUTEROL INHALER  . Breast cancer (HCC) 08/04/2017   1.8 cm ER 90%, PR 20%, HER-2/neu negative invasive mammary carcinoma of the left upper outer quadrant.  MammaPrint: High risk.  . Cancer (HCC)    1993,2007,2014 basal cell carcinoma  . Complication of anesthesia    HARD TIME WAKING UP  . Dysrhythmia    TACHYCARDIA  . GERD (gastroesophageal reflux disease)   . History of hiatal hernia    SMALL  . Hyperlipidemia   . Hypertension    PT STATES IN 01-18-18 PREOP PHONE INTERVIEW THAT HER BP HAS BEEN ELEVATED SINCE STARTING ON VALSARTAN- PCP SWITCHED PT BACK TO MAXIDE ON 01-18-18 AND PT STATES THAT SHE HAS LOST 6-10 POUNDS OF FLUID SINCE RESTARTING MAXIDE.    . PONV (postoperative nausea and vomiting)     Past Surgical History:  Procedure Laterality Date  . ABDOMINAL HYSTERECTOMY  1998  . bladder tack  2002,2008  . BREAST BIOPSY Bilateral   . BREAST BIOPSY Left 1990  . BREAST BIOPSY Left 2018   core bx- neg  . 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  . BREAST CYST EXCISION  x3  . BREAST RECONSTRUCTION WITH PLACEMENT OF TISSUE EXPANDER AND FLEX HD  (ACELLULAR HYDRATED DERMIS) Left 01/27/2018   Procedure: BREAST RECONSTRUCTION WITH PLACEMENT OF TISSUE EXPANDER AND FLEX HD (ACELLULAR HYDRATED DERMIS);  Surgeon: Dillingham, Claire S, DO;  Location: [facility] ORS;  Service: Plastics;  Laterality: Left;  . BREAST SURGERY Left 02/13/14   Fibrocystic changes, pseudo-angiomatous stromal hyperplasia. No atypia or malignancy.  . CARPAL TUNNEL RELEASE  x2  . CHOLECYSTECTOMY  2008  . COLONOSCOPY WITH PROPOFOL N/A 01/12/2015   Procedure: COLONOSCOPY WITH PROPOFOL;  Surgeon: Robert T Elliott, MD;  Location: [facility] ENDOSCOPY;  Service: Endoscopy;  Laterality: N/A;  . ESOPHAGOGASTRODUODENOSCOPY (EGD) WITH PROPOFOL N/A 01/12/2015   Procedure: ESOPHAGOGASTRODUODENOSCOPY (EGD) WITH PROPOFOL;  Surgeon: Robert T Elliott, MD;  Location: [facility] END[facility]COPY;  Service: Endoscopy;  Laterality: N/A;  . MASTECTOMY W/ SENTINEL NODE BIOPSY Left 01/27/2018   ypT1c ypN0;  MASTECTOMY WITH SENTINEL LYMPH NODE BIOPSY;  Surgeon: Byrnett, Jeffrey W, MD;  Location: [facility];  Service: General;  Laterality: Left;  . NASAL RECONSTRUCTION  1983  . PORTACATH PLACEMENT N/A 10/21/2017   Procedure: INSERTION PORT-A-CATH;  Surgeon: Byrnett, Jeffrey W, MD;  Location: [facility];  Service: General;  Laterality: N/A;  . SAVORY DILATION N/A 01/12/2015   Procedure: SAVORY DILATION;  Surgeon: Robert T Elliott, MD;  Location: [facility]   ENDOSCOPY;  Service: Endoscopy;  Laterality: N/A;  . TONSILLECTOMY AND ADENOIDECTOMY  1956    There were no vitals filed for this visit.  Subjective Assessment - 04/21/18 1355    Subjective  Patient reports 2/10 pain with increased tension following last injection yesterday. Reports 50cc injection that has made her feel much more "tight" at chest and shoulder" with some "spasm in the armpit"    Pertinent History  Patient is a 68 year old fmale s/p masectomy with expander placement 01/27/18 following stage 1 breast cancer diagnosis 08/05/17. Patient underwent chemo treatment for 4  and a half sessions from June-October, and had a very difficult time with chemo treatment. Patient reports no scheduled R masectomy at this time or plans to. Patient has had 3 injections so far into expander, and had 1 scheduled today, but was unable to complete this d/t increased tension under the breast, causing pain. Patient reports the plan at this time is to have injection in 2 weeks and continue injections until the end of Feb/beginning of March, when she will have implant surgery. Patient reports pain with overhead motion at the L ant thorax, and pain at night. Patient reports she has lifting restrictions to 5lb. Patient reports some nausea from oral chemotherapy, that she is coursed to continue for 5 years. Patient is retired, but reports she enjoys walking on [employer], and enjoys spending time with her grandson (48month old).  Patient reports worst pain over past week 8/10, best 2/10.     Limitations  Lifting;House hold activities    Diagnostic tests  none to date for this issue    Patient Stated Goals  Increase ROM and be able to hold and play with her grandchildren    Pain Onset  1 to 4 weeks ago           Manual therapy: -Superficial and deep technique utilized, including intermittent trigger pointing to L latissimus dorsi/teres minor, L bicepL rib intercostals, and L pec minor/major(increased time spent at pec minor/major and L bicep). Pt with significant TTP, mild-mod improvement s/p STM. - Multiple bouts of PROM with focus on abd and flex with 10sec hold and increasing ROM per pt tolerance.Focus on flex and ER.Full ROM following, with tension/pain at end range flex - GHJ post grade III mob with movement into abd 30sec bouts 8 bouts with end range achieved    Ther-Ex Pulleys abd x20 and flex x20 with 3 sec holds in neach position                    PT Education - 04/21/18 1415    Education Details  Continued HEP encouragement to maintain PT session gains     Person(s) Educated  Patient    Methods  Explanation    Comprehension  Verbalized understanding       PT Short Term Goals - 03/17/18 1537      PT SHORT TERM GOAL #1   Title  Pt will be independent with HEP in order to improve strength and decrease pain in order to improve pain-free function at home and work.        PT Long Term Goals - 03/17/18 1537      PT LONG TERM GOAL #1   Title  Pt will decrease worst pain as reported on NPRS by at least 3 points in order to demonstrate clinically significant reduction in pain.    Baseline  03/16/18 4/10    Time  8  Period  Weeks    Status  New      PT LONG TERM GOAL #2   Title  Patient will increase FOTO score to 67 to demonstrate predicted increase in functional mobility to complete ADLs    Baseline  03/16/18 50    Time  8    Period  Weeks    Status  New      PT LONG TERM GOAL #3   Title  Patient will demonstrate full active shoulder ROM in order to return to PLOF and complete ADLs    Baseline  03/16/18 see eval    Time  8    Period  Weeks    Status  New            Plan - 04/21/18 1453    Clinical Impression Statement  Patient with increased tension and decreased ROM this session following injection yesterday, requiring increased manual techniques to achieve full shoulder ROM. Patient with full ROM at the end of session    Rehab Potential  Good    Clinical Impairments Affecting Rehab Potential  (+) motivation, social support (-) age, active cancer, other comorbidities    PT Frequency  2x / week    PT Duration  8 weeks    PT Treatment/Interventions  ADLs/Self Care Home Management;Electrical Stimulation;Neuromuscular re-education;Functional mobility training;Therapeutic exercise;Therapeutic activities;Moist Heat;Traction;Ultrasound;Manual lymph drainage;Patient/family education;Passive range of motion;Joint Manipulations;Dry needling;Manual techniques;Scar mobilization;Taping    PT Next Visit Plan  decrease muscle tension,  periscapular strengthening     PT Home Exercise Plan  [id number] on medbridge    Consulted and Agree with Plan of Care  Patient       Patient will benefit from skilled therapeutic intervention in order to improve the following deficits and impairments:  Decreased endurance, Decreased mobility, Hypomobility, Improper body mechanics, Decreased range of motion, Decreased scar mobility, Postural dysfunction, Pain, Impaired UE functional use, Impaired flexibility, Increased fascial restricitons, Decreased activity tolerance, Decreased strength  Visit Diagnosis: Acute pain of left shoulder     Problem List Patient Active Problem List   Diagnosis Date Noted  . Acquired absence of breast 02/09/2018  . S/P mastectomy, left 02/02/2018  . Breast cancer (HCC) 01/27/2018  . Thrush 11/05/2017  . Goals of care, counseling/discussion 10/09/2017  . Hypokalemia 10/09/2017  . Acute renal failure (ARF) (HCC) 09/11/2017  . Diarrhea 09/11/2017  . Encounter for antineoplastic chemotherapy 09/03/2017  . Nodule of upper lobe of right lung 08/24/2017  . Osteopenia 08/24/2017  . Malignant neoplasm of left female breast (HCC) 08/07/2017  . 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  . Incomplete emptying of bladder 09/03/2016  . SUI (stress urinary incontinence, female) 09/03/2016  . Mass of right breast 02/14/2014  . Mass of upper outer quadrant of left breast 01/20/2014  . Arthritis 12/28/2013  . Asthma without status asthmaticus 12/28/2013  . Esophageal reflux 12/28/2013  . Hyperlipidemia 12/28/2013  . Hypertension 05/09/2003   Chelsea Miller PT, DPT Chelsea Miller 04/21/2018, 3:06 PM  [facility] [address] [place], [place], [address] Phone: [phone]   Fax:  [phone]  Name: Hugh A Rodriguez MRN: [id number] Date of Birth: 08/09/1950   

## 2018-04-22 ENCOUNTER — Encounter: Payer: Self-pay | Admitting: General Surgery

## 2018-04-22 ENCOUNTER — Other Ambulatory Visit: Payer: Self-pay

## 2018-04-22 ENCOUNTER — Ambulatory Visit (INDEPENDENT_AMBULATORY_CARE_PROVIDER_SITE_OTHER): Payer: Medicare Other

## 2018-04-22 ENCOUNTER — Ambulatory Visit (INDEPENDENT_AMBULATORY_CARE_PROVIDER_SITE_OTHER): Payer: Medicare Other | Admitting: General Surgery

## 2018-04-22 VITALS — BP 114/62 | HR 100 | Resp 16 | Ht 66.0 in | Wt 162.0 lb

## 2018-04-22 DIAGNOSIS — N6314 Unspecified lump in the right breast, lower inner quadrant: Secondary | ICD-10-CM

## 2018-04-22 DIAGNOSIS — N6310 Unspecified lump in the right breast, unspecified quadrant: Secondary | ICD-10-CM | POA: Diagnosis not present

## 2018-04-22 NOTE — Patient Instructions (Addendum)
The patient is aware to call back for any questions or new concerns.  May shower The surgical glue will gradually peal off May use an Ice pack as needed for comfort

## 2018-04-22 NOTE — Progress Notes (Signed)
Patient ID: Mariah Hogan, female   DOB: [birth year], 68 y.o.   MRN: [id number]  Chief Complaint  Patient presents with  . Procedure    port removal    HPI Mariah Hogan is a 68 y.o. female.  Here for port removal. She felt a knot just above the inframammary fold of the right breast about 2-3 weeks ago, mild tenderness..  The patient reports that on some mornings she awakens with her left hand feeling a little swollen which she attributed to her sentinel node biopsy.  She is been approved for port removal.  She has upcoming surgery with Dr Marla Roe for removal of the expander and placement of a permanent left breast implant.  HPI  Past Medical History:  Diagnosis Date  . Anemia   . Arthritis   . Asthma    H/O YEARS AGO-NOW HAS COUGHING SPELLS WHICH IS WHY SHE HAS HER ALBUTEROL INHALER  . BRCA gene mutation negative 08/12/2017   Variant of unknown significance at [facility] only abnormality.  . 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  MammaPrint: High risk.  . Cancer ([place])    [phone] basal cell carcinoma  . Complication of anesthesia    HARD TIME WAKING UP  . Dysrhythmia    TACHYCARDIA  . GERD (gastroesophageal reflux disease)   . History of hiatal hernia    SMALL  . Hyperlipidemia   . Hypertension    PT STATES IN 01-18-18 PREOP PHONE INTERVIEW THAT HER BP HAS BEEN ELEVATED SINCE STARTING ON VALSARTAN- PCP SWITCHED PT BACK TO MAXIDE ON 01-18-18 AND PT STATES THAT SHE HAS LOST 6-10 POUNDS OF FLUID SINCE RESTARTING MAXIDE.    Marland Kitchen PONV (postoperative nausea and vomiting)     Past Surgical History:  Procedure Laterality Date  . ABDOMINAL HYSTERECTOMY  1998  . bladder tack  8841,6606  . BREAST BIOPSY Bilateral   . BREAST BIOPSY Left 1990  . BREAST BIOPSY Left 2018   core bx- neg  . 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  Marland Kitchen BREAST CYST  EXCISION  x3  . BREAST RECONSTRUCTION WITH PLACEMENT OF TISSUE EXPANDER AND FLEX HD (ACELLULAR HYDRATED DERMIS) Left 01/27/2018   Procedure: BREAST RECONSTRUCTION WITH PLACEMENT OF TISSUE EXPANDER AND FLEX HD (ACELLULAR HYDRATED DERMIS);  Surgeon: Wallace Going, DO;  Location: [facility];  Service: Plastics;  Laterality: Left;  . BREAST SURGERY Left 02/13/14   Fibrocystic changes, pseudo-angiomatous stromal hyperplasia. No atypia or malignancy.  . CARPAL TUNNEL RELEASE  x2  . CHOLECYSTECTOMY  2008  . COLONOSCOPY WITH PROPOFOL N/A 01/12/2015   Procedure: COLONOSCOPY WITH PROPOFOL;  Surgeon: Manya Silvas, MD;  Location: [facility] END[facility]COPY;  Service: Endoscopy;  Laterality: N/A;  . ESOPHAGOGASTRODUODENOSCOPY (EGD) WITH PROPOFOL N/A 01/12/2015   Procedure: ESOPHAGOGASTRODUODENOSCOPY (EGD) WITH PROPOFOL;  Surgeon: Manya Silvas, MD;  Location: [facility] ENDOSCOPY;  Service: Endoscopy;  Laterality: N/A;  . MASTECTOMY W/ SENTINEL NODE BIOPSY Left 01/27/2018   ypT1c ypN0; ER 90%, PR 20%, HER-2/neu not overexpressed.  MASTECTOMY WITH SENTINEL LYMPH NODE BIOPSY;  Surgeon: Robert Bellow, MD;  Location: [facility];  Service: General;  Laterality: Left;  . MINOR BREAST BIOPSY Right 09/02/2017   Radiology perform procedure, fibrocystic changes right retroareolar area.  Marland Kitchen NASAL RECONSTRUCTION  1983  . PORTACATH PLACEMENT N/A 10/21/2017   Procedure: INSERTION PORT-A-CATH;  Surgeon: Robert Bellow, MD;  Location: [facility] ORS;  Service: General;  Laterality: N/A;  . SAVORY DILATION N/A 01/12/2015   Procedure: SAVORY DILATION;  Surgeon: Manya Silvas, MD;  Location: [facility] ENDOSCOPY;  Service: Endoscopy;  Laterality: N/A;  . TONSILLECTOMY AND ADENOIDECTOMY  1956    Family History  Problem Relation Age of Onset  . Breast cancer Sister 63       currently 10  . Breast cancer Paternal Grandmother 55       bilateral; deceased 18s   . Breast cancer Cousin 76       daughter of an unaffected maternal aunt  .  Breast cancer Cousin 71       kidney cancer also; daughter of an unaffected maternal aunt  . Other Mother        TAH/BSO prior to menopause  . Prostate cancer Paternal Grandfather        age at dx unknown  . Breast cancer Maternal Aunt        age at dx unknown  . Breast cancer Cousin 23       daughter of an unaffected maternal aunt    Social History Social History   Tobacco Use  . Smoking status: Never Smoker  . Smokeless tobacco: Never Used  Substance Use Topics  . Alcohol use: No    Alcohol/week: 0.0 standard drinks  . Drug use: No    Allergies  Allergen Reactions  . Penicillin G Shortness Of Breath, Swelling, Rash and Other (See Comments)    Has patient had a PCN reaction causing immediate rash, facial/tongue/throat swelling, SOB or lightheadedness with hypotension: Yes Has patient had a PCN reaction causing severe rash involving mucus membranes or skin necrosis: No Has patient had a PCN reaction that required hospitalization: No Has patient had a PCN reaction occurring within the last 10 years: No If all of the above answers are "NO", then may proceed with Cephalosporin use.  [name] [Docetaxel] Shortness Of Breath, Palpitations and Other (See Comments)    Back pain, Back Spasms  . Other Other (See Comments)    Dodecyl gallate: Positive patch test  Elta MD UV Clear SPF 46: Positive patch test  fragrance dodocylgallate dodocylgallate  . Parabens Other (See Comments)    Positive patch test   . Shellac Other (See Comments)    Positive patch test   . Fluocinonide Rash  . Latex Rash  . Tape Dermatitis  . Triamcinolone Rash    Current Outpatient Medications  Medication Sig Dispense Refill  . albuterol (PROVENTIL HFA;VENTOLIN HFA) 108 (90 Base) MCG/ACT inhaler Inhale 2 puffs into the lungs every 6 (six) hours as needed for wheezing or shortness of breath. 1 Inhaler 1  . atenolol (TENORMIN) 50 MG tablet Take 50 mg by mouth every morning.     . cetirizine (ZYRTEC)  10 MG tablet Take 10 mg by mouth daily before lunch.     . citalopram (CELEXA) 20 MG tablet Take 20 mg by mouth at bedtime.     . diazepam (VALIUM) 2 MG tablet Take 1 tablet (2 mg total) by mouth every 8 (eight) hours as needed for up to 5 days for anxiety or muscle spasms. 15 tablet 0  . HYDROcodone-acetaminophen (NORCO) 5-325 MG tablet Take 1 tablet by mouth every 6 (six) hours as needed for up to 5 days for moderate pain. 20 tablet 0  . letrozole (FEMARA) 2.5 MG tablet Take 1 tablet (2.5 mg total) by mouth daily. Future refill will be AFTER MD visit  and lab check. 90 tablet 3  . ondansetron (ZOFRAN) 4 MG tablet Take 1 tablet (4 mg total) by mouth every 8 (eight) hours as needed for up to 5 days for nausea or vomiting. 15 tablet 0  . pantoprazole (PROTONIX) 40 MG tablet Take 40 mg by mouth 2 (two) times daily as needed (heartburn).     . triamterene-hydrochlorothiazide (MAXZIDE-25) 37.5-25 MG tablet Take 1 tablet by mouth every morning.    . valACYclovir (VALTREX) 1000 MG tablet Take 1,000 mg by mouth See admin instructions. Take 1000 mg in the morning , may take a second 1000 mg dose as needed for fever blisters     . ciprofloxacin (CIPRO) 500 MG tablet Take 1 tablet (500 mg total) by mouth 2 (two) times daily for 5 days. (Patient not taking: Reported on 04/22/2018) 10 tablet 0   No current facility-administered medications for this visit.     Review of Systems Review of Systems  Constitutional: Negative.   Respiratory: Negative.   Cardiovascular: Negative.     Blood pressure 114/62, pulse 100, resp. rate 16, height 5' 6"  (1.676 m), weight 162 lb (73.5 kg), SpO2 99 %.  Physical Exam Physical Exam Exam conducted with a chaperone present.  Constitutional:      Appearance: Normal appearance. She is well-developed.  Eyes:     General: No scleral icterus.    Conjunctiva/sclera: Conjunctivae normal.  Neck:     Musculoskeletal: Neck supple.  Cardiovascular:     Rate and Rhythm: Normal  rate and regular rhythm.     Pulses: Normal pulses.     Heart sounds: Normal heart sounds.  Pulmonary:     Effort: Pulmonary effort is normal.     Breath sounds: Normal breath sounds.  Chest:     Breasts:        Right: No inverted nipple, mass, nipple discharge, skin change or tenderness.        Left: No inverted nipple, mass, nipple discharge, skin change or tenderness.    Lymphadenopathy:     Cervical: No cervical adenopathy.     Upper Body:     Right upper body: No supraclavicular or axillary adenopathy.     Left upper body: No supraclavicular or axillary adenopathy.  Skin:    General: Skin is warm and dry.  Neurological:     General: No focal deficit present.     Mental Status: She is alert and oriented to person, place, and time.  Psychiatric:        Behavior: Behavior normal.   Visual inspection of the hands at the time of her early afternoon appointment showed no asymmetry.  Measurements of the upper extremities were completed at a location 15 cm above as well as 10 and 20 cm below the olecranon process.  April 22, 2018: Right: 29.5, 25, 18.5 cm. Left: 30.5, 24.5, 18 cm.   Data Reviewed Limited ultrasound examination of the lower inner quadrant of the right breast was completed to assess the area where the patient is recently noted some focal thickening and tenderness.  There is a smoothly marginated with a single soft lobulation hypoechoic nodule measuring 0.2 x 0.22 x 0.23 cm in the 6 o'clock position about 5 cm from the nipple.  No internal vascular flow, no significant acoustic enhancement.  Observation warranted.  BI-RADS-3. Scanning in the medial aspect of the breast above the level inframammary fold shows no sick evidence of cystic or solid lesions or architectural distortion.  BI-RADS-2.  Port removal  was reviewed with the patient and she was amenable to proceed.  10 cc of 0.5% Xylocaine with 0.25% Marcaine with 1-200,000 notes of epinephrine was utilized after  skin cleansing with alcohol.  The area was then cleansed with ChloraPrep and draped.  The original incision was opened and the port extracted with the previously placed Prolene sutures intact.  The catheter tip was removed intact.  No bleeding was noted.  The pocket was closed with a running 3-0 Vicryl suture to the adipose layer.  The skin was closed with a running 4-0 Vicryl subcuticular suture.  Dermabond applied due to previous allergic wants to Tegaderm. Ice pack provided.  Wound care reviewed.  Assessment    Doing well status post neoadjuvant chemotherapy and subsequent left mastectomy.  Pending final reconstruction.      Plan    The patient will notify the office if she has any concerns regarding wound healing.    The patient had bilateral breast MRI completed in June 2019.  We will arrange for a right breast screening mammogram in June of 200,020 with office visit to follow.   HPI, assessment, plan and physical exam has been scribed under the direction and in the presence of Robert Bellow, MD. Karie Fetch, RN   Forest Gleason Yonas Bunda 04/22/2018, 9:39 PM

## 2018-04-23 ENCOUNTER — Encounter: Payer: Medicare Other | Admitting: Physical Therapy

## 2018-04-27 ENCOUNTER — Ambulatory Visit: Payer: Medicare Other | Admitting: Physical Therapy

## 2018-04-29 ENCOUNTER — Ambulatory Visit: Payer: Medicare Other | Admitting: Physical Therapy

## 2018-04-29 ENCOUNTER — Encounter
Admission: RE | Admit: 2018-04-29 | Discharge: 2018-04-29 | Disposition: A | Discharge status: Home / Self Care | Payer: Medicare Other | Source: Ambulatory Visit | Attending: Plastic Surgery | Admitting: Plastic Surgery

## 2018-04-29 ENCOUNTER — Other Ambulatory Visit: Payer: Self-pay

## 2018-04-29 DIAGNOSIS — Z01812 Encounter for preprocedural laboratory examination: Secondary | ICD-10-CM | POA: Insufficient documentation

## 2018-04-29 LAB — BASIC METABOLIC PANEL
Anion gap: 10 (ref 5–15)
BUN: 27 mg/dL — ABNORMAL HIGH (ref 8–23)
CO2: 25 mmol/L (ref 22–32)
Calcium: 9.8 mg/dL (ref 8.9–10.3)
Chloride: 100 mmol/L (ref 98–111)
Creatinine, Ser: 1.09 mg/dL — ABNORMAL HIGH (ref 0.44–1.00)
GFR calc Af Amer: 60 mL/min — ABNORMAL LOW (ref >60)
GFR calc non Af Amer: 52 mL/min — ABNORMAL LOW (ref >60)
Glucose, Bld: 112 mg/dL — ABNORMAL HIGH (ref 70–99)
Potassium: 3.7 mmol/L (ref 3.5–5.1)
Sodium: 135 mmol/L (ref 135–145)

## 2018-04-29 NOTE — Patient Instructions (Signed)
  Your procedure is scheduled on: Thursday May 13, 2018 Report to Same Day Surgery 2nd floor [facility] Entrance-take elevator on left to 2nd floor.  Check in with surgery information desk.) To find out your arrival time, call [phone] 1:00-3:00 PM on Wednesday May 12, 2018  Remember: Instructions that are not followed completely may result in serious medical risk, up to and including death, or upon the discretion of your surgeon and anesthesiologist your surgery may need to be rescheduled.    __x__ 1. Do not eat food (including mints, candies, chewing gum) after midnight the night before your procedure. You may drink clear liquids up to 2 hours before you are scheduled to arrive at the hospital for your procedure.  Do not drink anything within 2 hours of your scheduled arrival to the hospital.  Approved clear liquids:  --Water or Apple juice without pulp  --Clear carbohydrate beverage such as Gatorade or Powerade  --Black Coffee or Clear Tea (No milk, no creamers, do not add anything to the coffee or tea)    __x__ 2. No Alcohol for 24 hours before or after surgery.   __x__ 3. No Smoking or e-cigarettes for 24 hours before surgery.  Do not use any chewable tobacco products for at least 6 hours before surgery.   __x__ 4. Notify your doctor if there is any change in your medical condition (cold, fever, infections).   __x__ 5. On the morning of surgery brush your teeth with toothpaste and water.  You may rinse your mouth with mouthwash if you wish.  Do not swallow any toothpaste or mouthwash.  Please read over the following fact sheets that you were given:   [facility] Preparing for Surgery and/or MRSA Information    __x__ Use CHG Soap or Sage wipes as directed on instruction sheet    Do not wear jewelry, make-up, hairpins, clips or nail polish on the day of surgery.  Do not wear lotions, powders, deodorant, or perfumes.   Do not shave below the face/neck 48 hours  prior to surgery.   Do not bring valuables to the hospital.    [facility] is not responsible for any belongings or valuables.               Contacts, dentures or bridgework may not be worn into surgery.  For patients discharged on the day of surgery, you will NOT be permitted to drive yourself home.  You must have a responsible adult with you for 24 hours after surgery.  __x__ Take these medicines on the morning of surgery with a small sip of water:  1. Atenolol  2. Pantoprazole   Skip your Maxzide on the morning of surgery.  __x__ Use inhalers on the day of surgery and bring them with you to the hospital.   ____ Follow recommendations from Cardiologist, Pulmonologist or PCP regarding stopping Aspirin, Coumadin, Plavix, Eliquis, Effient, Pradaxa, and Pletal.  ____ Avoid Anti-inflammatories such as Advil, Ibuprofen, Motrin, Aleve, Naproxen, Naprosyn, BC/Goodies powders or aspirin products. You may continue to take Tylenol and Celebrex.   ____ Avoid Stop supplements until after surgery. You may continue to take Vitamin D, Vitamin B, and multivitamin.

## 2018-05-03 ENCOUNTER — Ambulatory Visit: Payer: Medicare Other | Admitting: Physical Therapy

## 2018-05-04 ENCOUNTER — Encounter: Payer: Self-pay | Admitting: Plastic Surgery

## 2018-05-04 ENCOUNTER — Ambulatory Visit (INDEPENDENT_AMBULATORY_CARE_PROVIDER_SITE_OTHER): Payer: Medicare Other | Admitting: Plastic Surgery

## 2018-05-04 VITALS — BP 140/70 | HR 70 | Temp 98.5°F | Ht 66.0 in | Wt 160.0 lb

## 2018-05-04 DIAGNOSIS — Z9012 Acquired absence of left breast and nipple: Secondary | ICD-10-CM

## 2018-05-04 DIAGNOSIS — C50912 Malignant neoplasm of unspecified site of left female breast: Secondary | ICD-10-CM

## 2018-05-04 NOTE — Progress Notes (Addendum)
   Subjective:    Patient ID: Mariah Hogan, female    DOB: May 17, 1950, 68 y.o.   MRN: [id number]  The patient is a 68 year old female here for follow-up on her breast reconstruction.  She wanted to be sure that she had every chance of becoming a C cup as possible.  She wants to be able to fit into a 36 C [employer] bra.  She has many of these in different colors and does not want to have to buy anymore.  Her incision is intact.  Her skin is lax.  We are able to fill more.      Review of Systems  Constitutional: Negative.   HENT: Negative.   Eyes: Negative.   Respiratory: Negative.   Cardiovascular: Negative.   Gastrointestinal: Negative.   Endocrine: Negative.   Genitourinary: Negative.   Musculoskeletal: Negative.        Objective:   Physical Exam Vitals signs and nursing note reviewed.  Constitutional:      Appearance: Normal appearance.  HENT:     Head: Normocephalic.  Eyes:     Extraocular Movements: Extraocular movements intact.  Cardiovascular:     Rate and Rhythm: Normal rate.  Pulmonary:     Effort: Pulmonary effort is normal.  Abdominal:     General: Abdomen is flat.  Neurological:     General: No focal deficit present.     Mental Status: She is alert.  Psychiatric:        Mood and Affect: Mood normal.        Thought Content: Thought content normal.        Judgment: Judgment normal.        .Assessment & Plan:  Invasive ductal carcinoma of left breast (HCC)  Acquired absence of left breast We placed injectable saline in the Expander using a sterile technique: Left: 50 cc for a total of 450 / 450 cc  The implant order was sent.  Risks and complications were reviewed again.

## 2018-05-04 NOTE — H&P (View-Only) (Signed)
   Subjective:    Patient ID: Mariah Hogan, female    DOB: [birth year], 68 y.o.   MRN: [id number]  The patient is a 68 year old female here for follow-up on her breast reconstruction.  She wanted to be sure that she had every chance of becoming a C cup as possible.  She wants to be able to fit into a 36 C [employer]'s Secret bra.  She has many of these in different colors and does not want to have to buy anymore.  Her incision is intact.  Her skin is lax.  We are able to fill more.      Review of Systems  Constitutional: Negative.   HENT: Negative.   Eyes: Negative.   Respiratory: Negative.   Cardiovascular: Negative.   Gastrointestinal: Negative.   Endocrine: Negative.   Genitourinary: Negative.   Musculoskeletal: Negative.        Objective:   Physical Exam Vitals signs and nursing note reviewed.  Constitutional:      Appearance: Normal appearance.  HENT:     Head: Normocephalic.  Eyes:     Extraocular Movements: Extraocular movements intact.  Cardiovascular:     Rate and Rhythm: Normal rate.  Pulmonary:     Effort: Pulmonary effort is normal.  Abdominal:     General: Abdomen is flat.  Neurological:     General: No focal deficit present.     Mental Status: She is alert.  Psychiatric:        Mood and Affect: Mood normal.        Thought Content: Thought content normal.        Judgment: Judgment normal.        .Assessment & Plan:  Invasive ductal carcinoma of left breast (HCC)  Acquired absence of left breast We placed injectable saline in the Expander using a sterile technique: Left: 50 cc for a total of 450 / 450 cc  The implant order was sent.  Risks and complications were reviewed again.

## 2018-05-06 ENCOUNTER — Encounter: Payer: Medicare Other | Admitting: Physical Therapy

## 2018-05-10 ENCOUNTER — Encounter: Payer: Medicare Other | Admitting: Physical Therapy

## 2018-05-12 MED ORDER — CIPROFLOXACIN IN D5W 400 MG/200ML IV SOLN
400.0000 mg | INTRAVENOUS | Status: AC
  Administered 2018-05-13: 400 mg via INTRAVENOUS

## 2018-05-13 ENCOUNTER — Other Ambulatory Visit: Payer: Self-pay

## 2018-05-13 ENCOUNTER — Encounter
Admission: RE | Disposition: A | Discharge status: Home / Self Care | Payer: Self-pay | Source: Ambulatory Visit | Attending: Plastic Surgery

## 2018-05-13 ENCOUNTER — Ambulatory Visit
Admission: RE | Admit: 2018-05-13 | Discharge: 2018-05-13 | Disposition: A | Discharge status: Home / Self Care | Payer: Medicare Other | Source: Ambulatory Visit | Attending: Plastic Surgery | Admitting: Plastic Surgery

## 2018-05-13 ENCOUNTER — Encounter: Payer: Medicare Other | Admitting: Physical Therapy

## 2018-05-13 ENCOUNTER — Encounter: Payer: Self-pay | Admitting: Emergency Medicine

## 2018-05-13 ENCOUNTER — Ambulatory Visit: Payer: Medicare Other | Admitting: Certified Registered Nurse Anesthetist

## 2018-05-13 DIAGNOSIS — M1990 Unspecified osteoarthritis, unspecified site: Secondary | ICD-10-CM | POA: Insufficient documentation

## 2018-05-13 DIAGNOSIS — K449 Diaphragmatic hernia without obstruction or gangrene: Secondary | ICD-10-CM | POA: Diagnosis not present

## 2018-05-13 DIAGNOSIS — E785 Hyperlipidemia, unspecified: Secondary | ICD-10-CM | POA: Insufficient documentation

## 2018-05-13 DIAGNOSIS — Z421 Encounter for breast reconstruction following mastectomy: Secondary | ICD-10-CM | POA: Diagnosis not present

## 2018-05-13 DIAGNOSIS — J45909 Unspecified asthma, uncomplicated: Secondary | ICD-10-CM | POA: Insufficient documentation

## 2018-05-13 DIAGNOSIS — Z853 Personal history of malignant neoplasm of breast: Secondary | ICD-10-CM | POA: Insufficient documentation

## 2018-05-13 DIAGNOSIS — K219 Gastro-esophageal reflux disease without esophagitis: Secondary | ICD-10-CM | POA: Diagnosis not present

## 2018-05-13 DIAGNOSIS — I10 Essential (primary) hypertension: Secondary | ICD-10-CM | POA: Diagnosis not present

## 2018-05-13 DIAGNOSIS — C50912 Malignant neoplasm of unspecified site of left female breast: Secondary | ICD-10-CM | POA: Diagnosis present

## 2018-05-13 DIAGNOSIS — Z9012 Acquired absence of left breast and nipple: Secondary | ICD-10-CM | POA: Diagnosis not present

## 2018-05-13 HISTORY — PX: REMOVAL OF TISSUE EXPANDER AND PLACEMENT OF IMPLANT: SHX6457

## 2018-05-13 SURGERY — REMOVAL, TISSUE EXPANDER, BREAST, WITH IMPLANT INSERTION
Anesthesia: General | Site: Breast | Laterality: Right

## 2018-05-13 MED ORDER — SODIUM CHLORIDE 0.9% FLUSH: 3.0000 mL | Freq: Two times a day (BID) | INTRAVENOUS | Status: DC

## 2018-05-13 MED ORDER — ACETAMINOPHEN 325 MG PO TABS
ORAL_TABLET | ORAL | Status: AC
  Filled 2018-05-13: qty 2

## 2018-05-13 MED ORDER — ONDANSETRON HCL 4 MG/2ML IJ SOLN
INTRAMUSCULAR | Status: AC
  Filled 2018-05-13: qty 2

## 2018-05-13 MED ORDER — EPHEDRINE SULFATE 50 MG/ML IJ SOLN
INTRAMUSCULAR | Status: AC
  Filled 2018-05-13: qty 1

## 2018-05-13 MED ORDER — BUPIVACAINE-EPINEPHRINE (PF) 0.25% -1:200000 IJ SOLN
INTRAMUSCULAR | Status: AC
  Filled 2018-05-13: qty 30

## 2018-05-13 MED ORDER — SCOPOLAMINE 1 MG/3DAYS TD PT72
MEDICATED_PATCH | TRANSDERMAL | Status: AC
  Filled 2018-05-13: qty 1

## 2018-05-13 MED ORDER — CIPROFLOXACIN IN D5W 400 MG/200ML IV SOLN
INTRAVENOUS | Status: AC
  Filled 2018-05-13: qty 200

## 2018-05-13 MED ORDER — SODIUM CHLORIDE 0.9 % IV SOLN
INTRAVENOUS | Status: DC | PRN
  Administered 2018-05-13: 400 mL

## 2018-05-13 MED ORDER — MEPERIDINE HCL 50 MG/ML IJ SOLN: 6.2500 mg | INTRAMUSCULAR | Status: DC | PRN

## 2018-05-13 MED ORDER — SCOPOLAMINE 1 MG/3DAYS TD PT72
1.0000 | MEDICATED_PATCH | TRANSDERMAL | Status: DC
  Administered 2018-05-13: 1.5 mg via TRANSDERMAL

## 2018-05-13 MED ORDER — EPHEDRINE SULFATE 50 MG/ML IJ SOLN
INTRAMUSCULAR | Status: DC | PRN
  Administered 2018-05-13: 5 mg via INTRAVENOUS

## 2018-05-13 MED ORDER — SODIUM CHLORIDE 0.9% FLUSH: 3.0000 mL | INTRAVENOUS | Status: DC | PRN

## 2018-05-13 MED ORDER — ONDANSETRON HCL 4 MG/2ML IJ SOLN
INTRAMUSCULAR | Status: DC | PRN
  Administered 2018-05-13: 4 mg via INTRAVENOUS

## 2018-05-13 MED ORDER — PROPOFOL 10 MG/ML IV BOLUS
INTRAVENOUS | Status: DC | PRN
  Administered 2018-05-13: 30 mg via INTRAVENOUS
  Administered 2018-05-13: 120 mg via INTRAVENOUS

## 2018-05-13 MED ORDER — PROMETHAZINE HCL 25 MG/ML IJ SOLN: 6.2500 mg | INTRAMUSCULAR | Status: DC | PRN

## 2018-05-13 MED ORDER — MIDAZOLAM HCL 2 MG/2ML IJ SOLN
INTRAMUSCULAR | Status: AC
  Filled 2018-05-13: qty 2

## 2018-05-13 MED ORDER — SODIUM CHLORIDE 0.9 % IV SOLN: 250.0000 mL | INTRAVENOUS | Status: DC | PRN

## 2018-05-13 MED ORDER — BACITRACIN 50000 UNITS IM SOLR
INTRAMUSCULAR | Status: AC
  Filled 2018-05-13: qty 1

## 2018-05-13 MED ORDER — LACTATED RINGERS IV SOLN
INTRAVENOUS | Status: DC
  Administered 2018-05-13: 08:00:00 via INTRAVENOUS

## 2018-05-13 MED ORDER — FENTANYL CITRATE (PF) 100 MCG/2ML IJ SOLN
INTRAMUSCULAR | Status: AC
  Filled 2018-05-13: qty 2

## 2018-05-13 MED ORDER — ACETAMINOPHEN 650 MG RE SUPP: 650.0000 mg | RECTAL | Status: DC | PRN

## 2018-05-13 MED ORDER — PROPOFOL 10 MG/ML IV BOLUS
INTRAVENOUS | Status: AC
  Filled 2018-05-13: qty 20

## 2018-05-13 MED ORDER — MIDAZOLAM HCL 2 MG/2ML IJ SOLN
INTRAMUSCULAR | Status: DC | PRN
  Administered 2018-05-13: 2 mg via INTRAVENOUS

## 2018-05-13 MED ORDER — LIDOCAINE HCL (PF) 2 % IJ SOLN
INTRAMUSCULAR | Status: AC
  Filled 2018-05-13: qty 10

## 2018-05-13 MED ORDER — ROCURONIUM BROMIDE 100 MG/10ML IV SOLN
INTRAVENOUS | Status: DC | PRN
  Administered 2018-05-13: 50 mg via INTRAVENOUS

## 2018-05-13 MED ORDER — LIDOCAINE HCL (CARDIAC) PF 100 MG/5ML IV SOSY
PREFILLED_SYRINGE | INTRAVENOUS | Status: DC | PRN
  Administered 2018-05-13: 100 mg via INTRATRACHEAL

## 2018-05-13 MED ORDER — FENTANYL CITRATE (PF) 100 MCG/2ML IJ SOLN
INTRAMUSCULAR | Status: DC | PRN
  Administered 2018-05-13 (×2): 50 ug via INTRAVENOUS

## 2018-05-13 MED ORDER — DEXAMETHASONE SODIUM PHOSPHATE 10 MG/ML IJ SOLN
INTRAMUSCULAR | Status: DC | PRN
  Administered 2018-05-13: 4 mg via INTRAVENOUS

## 2018-05-13 MED ORDER — GLYCOPYRROLATE 0.2 MG/ML IJ SOLN
INTRAMUSCULAR | Status: DC | PRN
  Administered 2018-05-13: 0.3 mg via INTRAVENOUS

## 2018-05-13 MED ORDER — NEOSTIGMINE METHYLSULFATE 10 MG/10ML IV SOLN
INTRAVENOUS | Status: DC | PRN
  Administered 2018-05-13: 1.5 mg via INTRAVENOUS

## 2018-05-13 MED ORDER — BUPIVACAINE-EPINEPHRINE 0.25% -1:200000 IJ SOLN
INTRAMUSCULAR | Status: DC | PRN
  Administered 2018-05-13: 20 mL

## 2018-05-13 MED ORDER — OXYCODONE HCL 5 MG PO TABS: 5.0000 mg | ORAL_TABLET | ORAL | Status: DC | PRN

## 2018-05-13 MED ORDER — OXYCODONE HCL 5 MG/5ML PO SOLN: 5.0000 mg | Freq: Once | ORAL | Status: DC | PRN

## 2018-05-13 MED ORDER — OXYCODONE HCL 5 MG PO TABS: 5.0000 mg | ORAL_TABLET | Freq: Once | ORAL | Status: DC | PRN

## 2018-05-13 MED ORDER — ACETAMINOPHEN 325 MG PO TABS
650.0000 mg | ORAL_TABLET | ORAL | Status: DC | PRN
  Administered 2018-05-13: 650 mg via ORAL

## 2018-05-13 MED ORDER — FENTANYL CITRATE (PF) 100 MCG/2ML IJ SOLN
25.0000 ug | INTRAMUSCULAR | Status: DC | PRN
  Administered 2018-05-13: 25 ug via INTRAVENOUS

## 2018-05-13 SURGICAL SUPPLY — 41 items
BAG DECANTER FOR FLEXI CONT (MISCELLANEOUS) ×4 IMPLANT
BINDER BREAST LRG (GAUZE/BANDAGES/DRESSINGS) IMPLANT
BINDER BREAST MEDIUM (GAUZE/BANDAGES/DRESSINGS) IMPLANT
BINDER BREAST XLRG (GAUZE/BANDAGES/DRESSINGS) ×4 IMPLANT
BINDER BREAST XXLRG (GAUZE/BANDAGES/DRESSINGS) IMPLANT
BLADE BOVIE TIP EXT 4 (BLADE) ×8 IMPLANT
BLADE SURG 15 STRL LF DISP TIS (BLADE) ×2 IMPLANT
BLADE SURG 15 STRL SS (BLADE) ×2
CANISTER SUCT 3000ML PPV (MISCELLANEOUS) ×4 IMPLANT
CHLORAPREP W/TINT 26ML (MISCELLANEOUS) ×8 IMPLANT
COVER WAND RF STERILE (DRAPES) IMPLANT
DERMABOND ADVANCED (GAUZE/BANDAGES/DRESSINGS) ×2
DERMABOND ADVANCED .7 DNX12 (GAUZE/BANDAGES/DRESSINGS) ×2 IMPLANT
DRAPE CHEST BREAST 15X10 FENES (DRAPES) ×4 IMPLANT
ELECT CAUTERY BLADE TIP 2.5 (TIP) ×4
ELECT REM PT RETURN 9FT ADLT (ELECTROSURGICAL) ×4
ELECTRODE CAUTERY BLDE TIP 2.5 (TIP) ×2 IMPLANT
ELECTRODE REM PT RTRN 9FT ADLT (ELECTROSURGICAL) ×2 IMPLANT
GAUZE SPONGE 4X4 12PLY STRL (GAUZE/BANDAGES/DRESSINGS) IMPLANT
GOWN STRL REUS W/ TWL LRG LVL3 (GOWN DISPOSABLE) ×4 IMPLANT
GOWN STRL REUS W/TWL LRG LVL3 (GOWN DISPOSABLE) ×4
IMPL SILICONE 480CC (Breast) ×2 IMPLANT
IMPLANT SILICONE 480CC (Breast) ×4 IMPLANT
KIT TURNOVER KIT A (KITS) ×4 IMPLANT
NDL HPO THNWL 1X22GA REG BVL (NEEDLE) ×2 IMPLANT
NDL SAFETY ECLIPSE 18X1.5 (NEEDLE) ×2 IMPLANT
NEEDLE HYPO 18GX1.5 SHARP (NEEDLE) ×2
NEEDLE SAFETY 22GX1 (NEEDLE) ×2
NS IRRIG 1000ML POUR BTL (IV SOLUTION) ×4 IMPLANT
PACK BASIN MAJOR ARMC (MISCELLANEOUS) ×4 IMPLANT
PAD ABD DERMACEA PRESS 5X9 (GAUZE/BANDAGES/DRESSINGS) ×8 IMPLANT
SUT MNCRL 3-0 UNDYED SH (SUTURE) ×4 IMPLANT
SUT MNCRL 4-0 (SUTURE) ×4
SUT MNCRL 4-0 27XMFL (SUTURE) ×4
SUT MNCRL+ 5-0 UNDYED PC-3 (SUTURE) ×4 IMPLANT
SUT MONOCRYL 3-0 UNDYED (SUTURE) ×4
SUT MONOCRYL 5-0 (SUTURE) ×4
SUTURE MNCRL 4-0 27XMF (SUTURE) ×4 IMPLANT
SYR 10ML LL (SYRINGE) ×4 IMPLANT
SYR BULB IRRIG 60ML STRL (SYRINGE) ×4 IMPLANT
TOWEL OR 17X26 4PK STRL BLUE (TOWEL DISPOSABLE) IMPLANT

## 2018-05-13 NOTE — Anesthesia Postprocedure Evaluation (Signed)
Anesthesia Post Note  Patient: SHONDREA STEINERT  Procedure(s) Performed: REMOVAL OF TISSUE EXPANDER AND PLACEMENT OF IMPLANT (Left Breast)  Patient location during evaluation: PACU Anesthesia Type: General Level of consciousness: awake and alert and oriented Pain management: pain level controlled Vital Signs Assessment: post-procedure vital signs reviewed and stable Respiratory status: spontaneous breathing, nonlabored ventilation and respiratory function stable Cardiovascular status: blood pressure returned to baseline and stable Postop Assessment: no signs of nausea or vomiting Anesthetic complications: no     Last Vitals:  Vitals:   05/13/18 1135 05/13/18 1229  BP: 135/62 (!) 128/59  Pulse: 68 71  Resp: 16   Temp: (!) 36.1 C   SpO2: 93% 95%    Last Pain:  Vitals:   05/13/18 1229  TempSrc:   PainSc: 6                  Arryanna Holquin

## 2018-05-13 NOTE — Anesthesia Preprocedure Evaluation (Signed)
Anesthesia Evaluation  Patient identified by MRN, date of birth, ID band Patient awake    Reviewed: Allergy & Precautions, NPO status , Patient's Chart, lab work & pertinent test results  History of Anesthesia Complications (+) PONV and history of anesthetic complications  Airway Mallampati: II  TM Distance: >3 FB Neck ROM: Full    Dental no notable dental hx.    Pulmonary asthma , neg sleep apnea,    breath sounds clear to auscultation- rhonchi (-) wheezing      Cardiovascular hypertension, Pt. on medications (-) CAD, (-) Past MI, (-) Cardiac Stents and (-) CABG + dysrhythmias Atrial Fibrillation  Rhythm:Regular Rate:Normal - Systolic murmurs and - Diastolic murmurs    Neuro/Psych neg Seizures negative neurological ROS  negative psych ROS   GI/Hepatic Neg liver ROS, hiatal hernia, GERD  ,  Endo/Other  negative endocrine ROSneg diabetes  Renal/GU Renal InsufficiencyRenal disease     Musculoskeletal  (+) Arthritis ,   Abdominal (+) - obese,   Peds  Hematology  (+) anemia ,   Anesthesia Other Findings Past Medical History: No date: Anemia No date: Arthritis No date: Asthma     Comment:  H/O YEARS AGO-NOW HAS COUGHING SPELLS WHICH IS WHY SHE               HAS HER ALBUTEROL INHALER 08/12/2017: BRCA gene mutation negative     Comment:  Variant of unknown significance at [facility] only abnormality. 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                MammaPrint: High risk. No date: Cancer [facility])     Comment:  [phone] basal cell carcinoma No date: Complication of anesthesia     Comment:  HARD TIME WAKING UP No date: Dysrhythmia     Comment:  TACHYCARDIA No date: GERD (gastroesophageal reflux disease) No date: History of hiatal hernia     Comment:  SMALL No date: Hyperlipidemia No date:  Hypertension     Comment:  PT STATES IN 01-18-18 PREOP PHONE INTERVIEW THAT HER BP               HAS BEEN ELEVATED SINCE STARTING ON VALSARTAN- PCP               SWITCHED PT BACK TO MAXIDE ON 01-18-18 AND PT STATES THAT              SHE HAS LOST 6-10 POUNDS OF FLUID SINCE RESTARTING               MAXIDE.   No date: PONV (postoperative nausea and vomiting)   Reproductive/Obstetrics                             Anesthesia Physical Anesthesia Plan  ASA: III  Anesthesia Plan: General   Post-op Pain Management:    Induction: Intravenous  PONV Risk Score and Plan: 2 and Ondansetron, Dexamethasone, Midazolam and Scopolamine patch - Pre-op  Airway Management Planned: Oral ETT  Additional Equipment:   Intra-op Plan:   Post-operative Plan: Extubation in OR  Informed Consent: I have reviewed the patients History and Physical, chart, labs and discussed the procedure including the risks, benefits and alternatives for the proposed anesthesia with the patient  or authorized representative who has indicated his/her understanding and acceptance.     Dental advisory given  Plan Discussed with: CRNA and Anesthesiologist  Anesthesia Plan Comments:         Anesthesia Quick Evaluation

## 2018-05-13 NOTE — Anesthesia Post-op Follow-up Note (Signed)
Anesthesia QCDR form completed.        

## 2018-05-13 NOTE — Anesthesia Procedure Notes (Signed)
Procedure Name: Intubation Date/Time: 05/13/2018 8:59 AM Performed by: Babs Sciara, CRNA Pre-anesthesia Checklist: Patient identified, Emergency Drugs available, Suction available, Patient being monitored and Timeout performed Patient Re-evaluated:Patient Re-evaluated prior to induction Oxygen Delivery Method: Circle system utilized Preoxygenation: Pre-oxygenation with 100% oxygen Induction Type: IV induction Ventilation: Mask ventilation without difficulty Laryngoscope Size: McGraph and 3 Tube type: Oral Tube size: 7.0 mm Number of attempts: 1 Airway Equipment and Method: Stylet and Video-laryngoscopy Placement Confirmation: positive ETCO2 and breath sounds checked- equal and bilateral Secured at: 22 (lip) cm Tube secured with: Tape Dental Injury: Teeth and Oropharynx as per pre-operative assessment  Difficulty Due To: Difficulty was unanticipated

## 2018-05-13 NOTE — Interval H&P Note (Signed)
History and Physical Interval Note:  05/13/2018 7:58 AM  Mariah Hogan  has presented today for surgery, with the diagnosis of Malignant neoplasm of left female breast, acquired absence of breast  The various methods of treatment have been discussed with the patient and family. After consideration of risks, benefits and other options for treatment, the patient has consented to  Procedure(s) with comments: REMOVAL OF TISSUE EXPANDER AND PLACEMENT OF IMPLANT (Left) - total surgery time should be 3 hours, per provider MASTOPEXY (Right) as a surgical intervention .  The patient's history has been reviewed, patient examined, no change in status, stable for surgery.  I have reviewed the patient's chart and labs.  Questions were answered to the patient's satisfaction.  Patient and I have decided to hold on the right breast.  This will be left breast only today.   Loel Lofty [place] Pelto

## 2018-05-13 NOTE — Progress Notes (Signed)
Pt stating pain is now a 7, but tolerable, ice pack applied , sats dropping at times , pt encouraged to deep breathe, and cough.

## 2018-05-13 NOTE — Op Note (Signed)
Op report Bilateral Exchange   DATE OF OPERATION: 05/13/2018  LOCATION:  [facility] Main OR Outpatient  SURGICAL DIVISION: Plastic Surgery  PREOPERATIVE DIAGNOSES:  1.Left History of breast cancer.  2. Acquired absence of left breast.   POSTOPERATIVE DIAGNOSES:  1. Left History of breast cancer.  2. Acquired absence of left breast.   PROCEDURE:  1. Exchange of left tissue expander for implant.  2. Capsulotomies for implant respositioning.  SURGEON: Anshu Wehner Sanger Nyko Gell, DO  ANESTHESIA:  General.   COMPLICATIONS: None.   IMPLANTS: Left - Mentor Smooth Round High Profile Gel 480cc. Ref #[id number].  Serial Number [id number]  INDICATIONS FOR PROCEDURE:  The patient, Mariah Hogan, is a 68 y.o. female born on 1950-12-18, is here for treatment after a left mastectomy.  She had tissue expanders placed at the time of mastectomies. She now presents for exchange of her expander for an implant.  She requires capsulotomies to better position the implants. MRN: [id number]  CONSENT:  Informed consent was obtained directly from the patient. Risks, benefits and alternatives were fully discussed. Specific risks including but not limited to bleeding, infection, hematoma, seroma, scarring, pain, implant infection, implant extrusion, capsular contracture, asymmetry, wound healing problems, and need for further surgery were all discussed. The patient did have an ample opportunity to have her questions answered to her satisfaction.   DESCRIPTION OF PROCEDURE:  The patient was taken to the operating room. SCDs were placed and IV antibiotics were given. The patient's chest was prepped and draped in a sterile fashion. A time out was performed and the implants to be used were identified.    Left breast: The old mastectomy scar was excised.  The mastectomy flaps from the superior and inferior flaps were raised over the pectoralis major muscle for several centimeters to minimize tension  for the closure. The pectoralis was split inferior to the skin incision to expose and remove the tissue expander.  Inspection of the pocket showed a normal healthy capsule and good integration of the biologic matrix.   Circumferential capsulotomies were performed to allow for breast pocket expansion.  Measurements were made and a sizer utilized to confirm adequate pocket size for the implant dimensions.  Hemostasis was ensured with the electrocautery.  New gloves were applied. The implant was soaked in antibiotic solution and placed in the pocket and oriented appropriately. There was a small area of 3 x 4 cm bulge of fat on the lateral superficial aspect of the breast.  This was excised for an improved contour.  The tissue was sent to pathology.  It was not suspicious looking.   The pectoralis major muscle and capsule on the anterior surface were re-closed with a 3-0 Monocryl suture. The remaining skin was closed with 4-0 Monocryl deep dermal and 5-0 Monocryl subcuticular stitches.  Dermabond was applied to the incision site. A breast binder and ABDs were placed.  The patient was allowed to wake from anesthesia and taken to the recovery room in satisfactory condition.

## 2018-05-13 NOTE — Discharge Instructions (Addendum)
AMBULATORY SURGERY  DISCHARGE INSTRUCTIONS   1) The drugs that you were given will stay in your system until tomorrow so for the next 24 hours you should not:  A) Drive an automobile B) Make any legal decisions C) Drink any alcoholic beverage   2) You may resume regular meals tomorrow.  Today it is better to start with liquids and gradually work up to solid foods.  You may eat anything you prefer, but it is better to start with liquids, then soup and crackers, and gradually work up to solid foods.   3) Please notify your doctor immediately if you have any unusual bleeding, trouble breathing, redness and pain at the surgery site, drainage, fever, or pain not relieved by medication.    4) Additional Instructions:        Please contact your physician with any problems or Same Day Surgery at [phone], Monday through Friday 6 am to 4 pm, or [place] at [facility] number at [phone].INSTRUCTIONS FOR AFTER BREAST SURGERY   The following information will help you and your family understand what to expect when you are discharged from the hospital.  Following these guidelines will help ensure a smooth recovery and reduce risks of complications.   Postoperative instructions include information on: diet, wound care, medications and physical activity.  AFTER SURGERY Expect to go home after the procedure.  In some cases, you may need to spend one night in the hospital for observation.  DIET Breast surgery does not require a specific diet.  However, I have to mention that the healthier you eat the better your body can start healing. It is important to increasing your protein intake.  This means limiting the foods with sugar and carbohydrates.  Focus on vegetables and some meat.  If you have any liposuction during your procedure be sure to drink water.  If your urine is bright yellow, then it is concentrated, and you need to drink more water.  As a general rule after surgery, you  should have 8 ounces of water every hour while awake.  If you find you are persistently nauseated or unable to take in liquids let us know.  NO TOBACCO USE or EXPOSURE.  This will slow your healing process and increase the risk of a wound.  WOUND CARE You can shower the day after surgery.  Use fragrance free soap.  Dial, [place] and [place] are usually mild on the skin. If you have a drain clean with baby wipes until the drain is removed.  If you have steri-strips / tape directly attached to your skin leave them in place. It is OK to get these wet.  No baths, pools or hot tubs for two weeks. We close your incision to leave the smallest and best-looking scar. No ointment or creams on your incisions until given the go ahead.  Especially not Neosporin (Too many skin reactions with this one).  A few weeks after surgery you can use Mederma and start massaging the scar. We ask you to wear your binder or sports bra for the first 6 weeks around the clock, including while sleeping. This provides added comfort and helps reduce the fluid accumulation at the surgery site.  ACTIVITY No heavy lifting until cleared by the doctor.  This usually means no more than a half-gallon of milk.  It is OK to walk and climb stairs. In fact, moving your legs is very important to decrease your risk of a blood clot.  It will also help  keep you from getting deconditioned.  Every 1 to 2 hours get up and walk for 5 minutes. This will help with a quicker recovery back to normal.  Let pain be your guide so you don't do too much.  NO, you cannot do the spring cleaning and don't plan on taking care of anyone else.  This is your time for TLC.  You will be more comfortable if you sleep and rest with your head elevated either with a few pillows under you or in a recliner.  No stomach sleeping for a few months.  WORK Everyone returns to work at different times. As a rough guide, most people take at least 1 - 2 weeks off prior to returning to work.  If you need documentation for your job, bring the forms to your postoperative follow up visit.  DRIVING Arrange for someone to bring you home from the hospital.  You may be able to drive a few days after surgery but not while taking any narcotics or valium.  BOWEL MOVEMENTS Constipation can occur after anesthesia and while taking pain medication.  It is important to stay ahead for your comfort.  We recommend taking Milk of Magnesia (2 tablespoons; twice a day) while taking the pain pills.  SEROMA This is fluid your body tried to put in the surgical site.  This is normal but if it creates tight skinny skin let us know.  It usually decreases in a few weeks.  WHEN TO CALL Call your surgeon's office if any of the following occur:  Fever 101 degrees F or greater  Excessive bleeding or fluid from the incision site.  Pain that increases over time without aid from the medications  Redness, warmth, or pus draining from incision sites  Persistent nausea or inability to take in liquids  Severe misshapen area that underwent the operation.  Here are some resources:  1. Plastic surgery website: https://www.plasticsurgery.org/for-medical-professionals/education-and-resources/publications/breast-reconstruction-magazine 2. Breast Reconstruction Awareness Campaign:  [employer]nz 3. Plastic surgery Implant information:  https://www.plasticsurgery.org/patient-safety/breast-implant-safety

## 2018-05-13 NOTE — Transfer of Care (Signed)
Immediate Anesthesia Transfer of Care Note  Patient: Mariah Hogan  Procedure(s) Performed: REMOVAL OF TISSUE EXPANDER AND PLACEMENT OF IMPLANT (Left Breast)  Patient Location: PACU  Anesthesia Type:General  Level of Consciousness: awake and alert   Airway & Oxygen Therapy: Patient Spontanous Breathing and Patient connected to face mask oxygen  Post-op Assessment: Report given to RN and Post -op Vital signs reviewed and stable  Post vital signs: Reviewed and stable  Last Vitals:  Vitals Value Taken Time  BP 162/72 05/13/2018 10:29 AM  Temp 36.5 C 05/13/2018 10:29 AM  Pulse 70 05/13/2018 10:32 AM  Resp 14 05/13/2018 10:32 AM  SpO2 99 % 05/13/2018 10:32 AM  Vitals shown include unvalidated device data.  Last Pain:  Vitals:   05/13/18 1026  TempSrc:   PainSc: Asleep         Complications: No apparent anesthesia complications

## 2018-05-14 LAB — SURGICAL PATHOLOGY

## 2018-05-21 ENCOUNTER — Encounter: Payer: Self-pay | Admitting: Plastic Surgery

## 2018-05-21 ENCOUNTER — Ambulatory Visit (INDEPENDENT_AMBULATORY_CARE_PROVIDER_SITE_OTHER): Payer: Medicare Other | Admitting: Plastic Surgery

## 2018-05-21 ENCOUNTER — Other Ambulatory Visit: Payer: Self-pay

## 2018-05-21 VITALS — BP 134/73 | HR 71 | Temp 98.8°F | Ht 66.0 in | Wt 160.0 lb

## 2018-05-21 DIAGNOSIS — Z9012 Acquired absence of left breast and nipple: Secondary | ICD-10-CM

## 2018-05-21 NOTE — Progress Notes (Signed)
   Subjective:    Patient ID: Mariah Hogan, female    DOB: [birth year], 68 y.o.   MRN: [id number]  The patient is a 68 year old female here for follow-up after undergoing second stage surgery for her left breast.  She had her expander removed and the implant placed.  She had a Mentor smooth round high profile gel 480 cc implant placed.  She did well for the surgery and is doing well at home.  No sign of infection.  Bruising as expected.  There is a little bit of fluid in the flap area.  But nothing of concern.  She is very pleased with the results at this time.  She is still using her sports bra.    Review of Systems  Constitutional: Negative.   HENT: Negative.   Respiratory: Negative.   Cardiovascular: Negative.   Musculoskeletal: Negative.   Skin: Negative.   Psychiatric/Behavioral: Negative.        Objective:   Physical Exam Vitals signs and nursing note reviewed.  Constitutional:      Appearance: Normal appearance.  HENT:     Head: Normocephalic and atraumatic.  Cardiovascular:     Rate and Rhythm: Normal rate.  Pulmonary:     Effort: Pulmonary effort is normal.  Neurological:     General: No focal deficit present.     Mental Status: She is alert.  Psychiatric:        Mood and Affect: Mood normal.        Thought Content: Thought content normal.        Judgment: Judgment normal.        Assessment & Plan:  Acquired absence of left breast  S/P mastectomy, left  Continue with arm range of motion exercises.  Still no heavy lifting.  Continue with the sports bra or a bra without an underwire.  I would like to see her back in 1 month.

## 2018-06-09 ENCOUNTER — Encounter: Payer: Self-pay | Admitting: Hematology and Oncology

## 2018-06-09 ENCOUNTER — Other Ambulatory Visit: Payer: Self-pay

## 2018-06-09 ENCOUNTER — Inpatient Hospital Stay (HOSPITAL_BASED_OUTPATIENT_CLINIC_OR_DEPARTMENT_OTHER): Payer: Medicare Other | Admitting: Hematology and Oncology

## 2018-06-09 ENCOUNTER — Telehealth: Payer: Self-pay

## 2018-06-09 ENCOUNTER — Inpatient Hospital Stay: Payer: Medicare Other | Attending: Hematology and Oncology

## 2018-06-09 VITALS — BP 116/72 | HR 75 | Temp 98.5°F | Resp 18 | Ht 66.0 in | Wt 162.4 lb

## 2018-06-09 DIAGNOSIS — Z79811 Long term (current) use of aromatase inhibitors: Secondary | ICD-10-CM | POA: Insufficient documentation

## 2018-06-09 DIAGNOSIS — Z170 Estrogen receptor positive status [ER+]: Secondary | ICD-10-CM

## 2018-06-09 DIAGNOSIS — R5383 Other fatigue: Secondary | ICD-10-CM | POA: Diagnosis not present

## 2018-06-09 DIAGNOSIS — C50912 Malignant neoplasm of unspecified site of left female breast: Secondary | ICD-10-CM | POA: Insufficient documentation

## 2018-06-09 DIAGNOSIS — M8580 Other specified disorders of bone density and structure, unspecified site: Secondary | ICD-10-CM

## 2018-06-09 DIAGNOSIS — N941 Unspecified dyspareunia: Secondary | ICD-10-CM | POA: Diagnosis not present

## 2018-06-09 DIAGNOSIS — M85852 Other specified disorders of bone density and structure, left thigh: Secondary | ICD-10-CM

## 2018-06-09 LAB — CBC WITH DIFFERENTIAL/PLATELET
Abs Immature Granulocytes: 0.02 10*3/uL (ref 0.00–0.07)
Basophils Absolute: 0 10*3/uL (ref 0.0–0.1)
Basophils Relative: 1 %
Eosinophils Absolute: 0.4 10*3/uL (ref 0.0–0.5)
Eosinophils Relative: 8 %
HCT: 37.3 % (ref 36.0–46.0)
Hemoglobin: 12.9 g/dL (ref 12.0–15.0)
Immature Granulocytes: 0 %
Lymphocytes Relative: 23 %
Lymphs Abs: 1.2 10*3/uL (ref 0.7–4.0)
MCH: 31.4 pg (ref 26.0–34.0)
MCHC: 34.6 g/dL (ref 30.0–36.0)
MCV: 90.8 fL (ref 80.0–100.0)
Monocytes Absolute: 0.5 10*3/uL (ref 0.1–1.0)
Monocytes Relative: 9 %
Neutro Abs: 3 10*3/uL (ref 1.7–7.7)
Neutrophils Relative %: 59 %
Platelets: 203 10*3/uL (ref 150–400)
RBC: 4.11 MIL/uL (ref 3.87–5.11)
RDW: 12.8 % (ref 11.5–15.5)
WBC: 5.1 10*3/uL (ref 4.0–10.5)
nRBC: 0 % (ref 0.0–0.2)

## 2018-06-09 LAB — COMPREHENSIVE METABOLIC PANEL
ALT: 21 U/L (ref 0–44)
AST: 21 U/L (ref 15–41)
Albumin: 4 g/dL (ref 3.5–5.0)
Alkaline Phosphatase: 101 U/L (ref 38–126)
Anion gap: 8 (ref 5–15)
BUN: 23 mg/dL (ref 8–23)
CO2: 25 mmol/L (ref 22–32)
Calcium: 9.1 mg/dL (ref 8.9–10.3)
Chloride: 102 mmol/L (ref 98–111)
Creatinine, Ser: 0.9 mg/dL (ref 0.44–1.00)
GFR calc Af Amer: >60 mL/min (ref >60)
GFR calc non Af Amer: >60 mL/min (ref >60)
Glucose, Bld: 129 mg/dL — ABNORMAL HIGH (ref 70–99)
Potassium: 3.4 mmol/L — ABNORMAL LOW (ref 3.5–5.1)
Sodium: 135 mmol/L (ref 135–145)
Total Bilirubin: 0.5 mg/dL (ref 0.3–1.2)
Total Protein: 6.9 g/dL (ref 6.5–8.1)

## 2018-06-09 LAB — TSH: TSH: 3.201 u[IU]/mL (ref 0.350–4.500)

## 2018-06-09 NOTE — Telephone Encounter (Signed)
-----   Message from Lequita Asal, MD sent at 06/09/2018 12:25 PM EDT ----- Regarding: Please call patient  Review labs.  Potassium a little low.  Review potassium rich foods.  M ----- Message ----- From: Buel Ream, Lab In [place] Sent: 06/09/2018  11:26 AM EDT To: Lequita Asal, MD

## 2018-06-09 NOTE — Progress Notes (Signed)
Uncomfortable to left abdomen / no pain noted today

## 2018-06-09 NOTE — Telephone Encounter (Signed)
Informed patient of low potassium result and educated her on multiple potassium rich foods. Patient verbalizes understanding.

## 2018-06-09 NOTE — Progress Notes (Signed)
[facility]     [address]     [place], [place] [address]     Phone: [phone]      Fax: [phone]        Clinic day:  06/09/2018   Referring physician:  Marinda Elk, MD   Chief Complaint: Mariah Hogan is a 68 y.o. female with clinical stage IA left breast cancer s/p mastectomy who is seen for 3 month assessment on Femara.  HPI:  The patient was last seen in the medical oncology clinic on 03/15/2018.  At that time, she was doing well.  She was tolerating Femara well.  Exam was unremarkable.  She underwent port-a-cath removal by Dr. Bary Castilla on 04/22/2018.  She underwent exchange of tissue expander for implant and capsulotomies for implant repositioning on 05/13/2018 by Dr. Marla Roe.  Follow-up on 06/15/2018.  During the interim, she has done well.  She denies any pain.  She continues to take her Femara.  She saw Dr. Nehemiah Massed for rash at the base of her neck and head.  She was prescribed a steroid cream.  She has an appointment with Dr. Leafy Ro secondary to painful intercourse.  She denies any breast concerns.  She has some joint stiffness in her left hand; her fourth finger sometimes locks up.   Past Medical History:  Diagnosis Date   Anemia    Arthritis    Asthma    H/O YEARS AGO-NOW HAS COUGHING SPELLS WHICH IS WHY SHE HAS HER ALBUTEROL INHALER   BRCA gene mutation negative 08/12/2017   Variant of unknown significance at [facility] only abnormality.   Breast cancer ([place]) 08/04/2017   1.8 cm ER 90%, PR 20%, HER-2/neu negative invasive mammary carcinoma of the left upper outer quadrant.  MammaPrint: High risk.   Cancer ([place])    [phone] basal cell carcinoma   Complication of anesthesia    HARD TIME WAKING UP   Dysrhythmia    TACHYCARDIA   GERD (gastroesophageal reflux disease)    History of hiatal hernia    SMALL   Hyperlipidemia    Hypertension    PT STATES IN 01-18-18 PREOP PHONE INTERVIEW THAT HER BP  HAS BEEN ELEVATED SINCE STARTING ON VALSARTAN- PCP SWITCHED PT BACK TO [place] ON 01-18-18 AND PT STATES THAT SHE HAS LOST 6-10 POUNDS OF FLUID SINCE RESTARTING MAXIDE.     PONV (postoperative nausea and vomiting)     Past Surgical History:  Procedure Laterality Date   ABDOMINAL HYSTERECTOMY  1998   bladder tack  2002,2008   BREAST BIOPSY Bilateral    BREAST BIOPSY Left 1990   BREAST BIOPSY Left 2018   core bx- neg   BREAST BIOPSY Left 08/04/2017   left UOQ 2 oclock INVASIVE DUCTAL CARCINOMA.   BREAST CYST EXCISION  x3   BREAST RECONSTRUCTION WITH PLACEMENT OF TISSUE EXPANDER AND FLEX HD (ACELLULAR HYDRATED DERMIS) Left 01/27/2018   Procedure: BREAST RECONSTRUCTION WITH PLACEMENT OF TISSUE EXPANDER AND FLEX HD (ACELLULAR HYDRATED DERMIS);  Surgeon: Wallace Going, DO;  Location: [facility] ORS;  Service: Plastics;  Laterality: Left;   BREAST SURGERY Left 02/13/14   Fibrocystic changes, pseudo-angiomatous stromal hyperplasia. No atypia or malignancy.   CARPAL TUNNEL RELEASE  x2   CHOLECYSTECTOMY  2008   COLONOSCOPY WITH PROPOFOL N/A 01/12/2015   Procedure: COLONOSCOPY WITH PROPOFOL;  Surgeon: Manya Silvas, MD;  Location: [facility] ENDOSCOPY;  Service: Endoscopy;  Laterality: N/A;   ESOPHAGOGASTRODUODENOSCOPY (EGD) WITH PROPOFOL N/A 01/12/2015  Procedure: ESOPHAGOGASTRODUODENOSCOPY (EGD) WITH PROPOFOL;  Surgeon: Manya Silvas, MD;  Location: [facility] ENDOSCOPY;  Service: Endoscopy;  Laterality: N/A;   MASTECTOMY W/ SENTINEL NODE BIOPSY Left 01/27/2018   ypT1c ypN0; ER 90%, PR 20%, HER-2/neu not overexpressed.  MASTECTOMY WITH SENTINEL LYMPH NODE BIOPSY;  Surgeon: Robert Bellow, MD;  Location: [facility] ORS;  Service: General;  Laterality: Left;   MINOR BREAST BIOPSY Right 09/02/2017   Radiology perform procedure, fibrocystic changes right retroareolar area.   NASAL RECONSTRUCTION  1983   PORTACATH PLACEMENT N/A 10/21/2017   Procedure: INSERTION PORT-A-CATH;  Surgeon: Robert Bellow, MD;  Location: [facility] ORS;  Service: General;  Laterality: N/A;   REMOVAL OF TISSUE EXPANDER AND PLACEMENT OF IMPLANT Left 05/13/2018   Procedure: REMOVAL OF TISSUE EXPANDER AND PLACEMENT OF IMPLANT;  Surgeon: Wallace Going, DO;  Location: [facility] ORS;  Service: Plastics;  Laterality: Left;  total surgery time should be 3 hours, per provider   SAVORY DILATION N/A 01/12/2015   Procedure: SAVORY DILATION;  Surgeon: Manya Silvas, MD;  Location: [facility] ENDOSCOPY;  Service: Endoscopy;  Laterality: N/A;   TONSILLECTOMY AND ADENOIDECTOMY  1956    Family History  Problem Relation Age of Onset   Breast cancer Sister 19       currently 33   Breast cancer Paternal Grandmother 75       bilateral; deceased 89s    Breast cancer Cousin 23       daughter of an unaffected maternal aunt   Breast cancer Cousin 82       kidney cancer also; daughter of an unaffected maternal aunt   Other Mother        TAH/BSO prior to menopause   Prostate cancer Paternal Grandfather        age at dx unknown   Breast cancer Maternal Aunt        age at dx unknown   Breast cancer Cousin 5       daughter of an unaffected maternal aunt    Social History:  reports that she has never smoked. She has never used smokeless tobacco. She reports that she does not drink alcohol or use drugs.  Her daughter, [name], is a gastroenterology PA. She is a retired Customer service manager for 35 years. Patient denies known exposures to radiation on toxins. She is alone today.  Allergies:  Allergies  Allergen Reactions   Penicillin G Shortness Of Breath, Swelling, Rash and Other (See Comments)    Has patient had a PCN reaction causing immediate rash, facial/tongue/throat swelling, SOB or lightheadedness with hypotension: Yes Has patient had a PCN reaction causing severe rash involving mucus membranes or skin necrosis: No Has patient had a PCN reaction that required hospitalization: No Has patient had a PCN reaction  occurring within the last 10 years: No If all of the above answers are "NO", then may proceed with Cephalosporin use.   Taxotere [Docetaxel] Shortness Of Breath, Palpitations and Other (See Comments)    Back pain, Back Spasms   Other Other (See Comments)    Dodecyl gallate: Positive patch test  Elta MD UV Clear SPF 46: Positive patch test  fragrance dodocylgallate dodocylgallate   Parabens Other (See Comments)    Positive patch test    Shellac Other (See Comments)    Positive patch test    Fluocinonide Rash   Latex Rash   Tape Dermatitis   Triamcinolone Rash    Current Medications: Current Outpatient Medications  Medication Sig Dispense Refill  albuterol (PROVENTIL HFA;VENTOLIN HFA) 108 (90 Base) MCG/ACT inhaler Inhale 2 puffs into the lungs every 6 (six) hours as needed for wheezing or shortness of breath. 1 Inhaler 1   atenolol (TENORMIN) 50 MG tablet Take 50 mg by mouth every morning.      cetirizine (ZYRTEC) 10 MG tablet Take 10 mg by mouth daily before lunch.      citalopram (CELEXA) 20 MG tablet Take 20 mg by mouth at bedtime.      Desoximetasone (TOPICORT) 0.25 % ointment      letrozole (FEMARA) 2.5 MG tablet Take 1 tablet (2.5 mg total) by mouth daily. Future refill will be AFTER MD visit and lab check. 90 tablet 3   pantoprazole (PROTONIX) 40 MG tablet Take 40 mg by mouth 2 (two) times daily as needed (heartburn).      triamterene-hydrochlorothiazide (MAXZIDE-25) 37.5-25 MG tablet Take 1 tablet by mouth every morning.     valACYclovir (VALTREX) 1000 MG tablet Take 1,000 mg by mouth See admin instructions. Take 1000 mg in the morning , may take a second 1000 mg dose as needed for fever blisters      No current facility-administered medications for this visit.     Review of Systems  Constitutional: Negative for chills, diaphoresis, fever, malaise/fatigue and weight loss (stable).       Feels "tired".  "No energy".  HENT: Negative.  Negative for  congestion, ear discharge, ear pain, nosebleeds, sinus pain, sore throat and tinnitus.   Eyes: Negative.  Negative for blurred vision, double vision, photophobia and pain.  Respiratory: Positive for shortness of breath (with exertion). Negative for cough, hemoptysis, sputum production and wheezing.   Cardiovascular: Negative.  Negative for chest pain, palpitations, orthopnea, leg swelling and PND.  Gastrointestinal: Positive for nausea (little in the morning; crackers help). Negative for abdominal pain, blood in stool, constipation, diarrhea (loose stools), heartburn, melena and vomiting.  Genitourinary: Negative.  Negative for dysuria, frequency, hematuria and urgency.       Urine leakage.  Painful intercourse.  Musculoskeletal: Negative.  Negative for back pain, falls, joint pain, myalgias and neck pain.       Left 4th digit a little stiff and can get stuck.  Skin: Negative.  Negative for itching and rash.  Neurological: Negative.  Negative for dizziness, tremors, sensory change, speech change, focal weakness, weakness and headaches.  Endo/Heme/Allergies: Negative.  Does not bruise/bleed easily.  Psychiatric/Behavioral: Negative.  Negative for depression and memory loss. The patient is not nervous/anxious and does not have insomnia.   All other systems reviewed and are negative.  Performance status (ECOG): 1  Vital Signs BP 116/72 (BP Location: Right Arm, Patient Position: Sitting)    Pulse 75    Temp 98.5 F (36.9 C) (Temporal)    Resp 18    Ht _0  (1.676 m)    Wt 162 lb 5.9 oz (73.6 kg)    SpO2 98%    BMI 26.21 kg/m   Physical Exam  Constitutional: She is oriented to person, place, and time and well-developed, well-nourished, and in no distress. No distress.  HENT:  Head: Normocephalic and atraumatic.  Mouth/Throat: Oropharynx is clear and moist. No oropharyngeal exudate.  Long brown wig.  Eyes: Pupils are equal, round, and reactive to light. Conjunctivae and EOM are normal. No  scleral icterus.  Blue eyes.  Neck: Normal range of motion. Neck supple. No JVD present.  Cardiovascular: Normal rate, regular rhythm and normal heart sounds. Exam reveals no gallop and no  friction rub.  No murmur heard. Pulmonary/Chest: Effort normal and breath sounds normal. No respiratory distress. She has no wheezes. She has no rales. Left breast exhibits skin change (slight inferior edema) and tenderness. Breasts are symmetrical (s/p left mastectomy with saline implant; skin tight).  Abdominal: Soft. Bowel sounds are normal. She exhibits no distension and no mass. There is no abdominal tenderness. There is no rebound and no guarding.  Musculoskeletal: Normal range of motion.        General: No tenderness or edema.  Lymphadenopathy:    She has no cervical adenopathy.    She has no axillary adenopathy.       Right: No supraclavicular adenopathy present.       Left: No supraclavicular adenopathy present.  Neurological: She is alert and oriented to person, place, and time. Gait normal.  Skin: Skin is warm and dry. No rash noted. She is not diaphoretic. No erythema. No pallor.  Psychiatric: Mood, affect and judgment normal.  Nursing note and vitals reviewed.   No visits with results within 3 Day(s) from this visit.  Latest known visit with results is:  Admission on 05/13/2018, Discharged on 05/13/2018  Component Date Value Ref Range Status   SURGICAL PATHOLOGY 05/13/2018    Final                   Value:Surgical Pathology CASE: [id number] PATIENT: Mariah Hogan Surgical Pathology Report     SPECIMEN SUBMITTED: A. Breast tissue, lateral, left  CLINICAL HISTORY: None provided  PRE-OPERATIVE DIAGNOSIS: None provided  POST-OPERATIVE DIAGNOSIS: None provided.     DIAGNOSIS: A. BREAST TISSUE, LEFT LATERAL; MAMMOPLASTY: - FIBROSIS AND FOREIGN BODY TYPE GIANT CELL REACTION, CONSISTENT WITH BREAST CAPSULE. - BENIGN FIBROADIPOSE TISSUE. - NEGATIVE FOR ATYPICAL  PROLIFERATIVE BREAST DISEASE.  GROSS DESCRIPTION: A. Labeled: Left lateral breast tissue Received: Formalin Tissue fragment(s): 3; designated as fragments #1-3 Size: Fragment #1 - 0.6 x 0.4 x 0.2 cm.  Fragment #2 - 1.2 x 0.7 x 0.3 cm.  Fragment #3 - 2.0 x 1.5 x 0.7 cm. Description: Received are irregular, unoriented fragments of tan-yellow fibroadipose tissue.  The surgical resection margins are inked as follows: fragment #1 - green,  fragment #2 - blue, and fragment #3 - black.  Sectioning each f                         ragment displays tan-yellow, lobulated, grossly unremarkable breast parenchyma.  No abnormalities or mass lesions are grossly identified.  Time/date in fixative: Collected and placed into formalin at 10:05 AM on 05/13/2018 Cold ischemic time: Less than 1 hour Total fixation time: 8.5 hours  The specimen is submitted entirely as follows: 1 - entire, intact fragment #1 2 - entire, bisected fragment #2 3-4 - entire, serially sectioned fragment #3        Final Diagnosis performed by Allena Napoleon, MD.   Electronically signed 05/14/2018 12:36:24PM The electronic signature indicates that the named Attending Pathologist has evaluated the specimen  Technical component performed at [facility], [address], [place], [place] [address] Lab: [phone] Dir: Rush Farmer, MD, MMM  Professional component performed at [facility], [facility], [place], [place],  [address] Lab: [phone] Dir: Dellia Nims. Rubinas, MD     Assessment:  Mariah Hogan is a 68 y.o. female with clinical stage IA left breast cancer s/p neoadjuvant chemotherapy followed by mastectomy,sentinel lymph node biopsy, and breast reconstruction with placement of a tissue expander  on 01/27/2018.  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  There was no lymphovascular invasion.  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  Margins were negative.  There were 2  negative sentinel lymph nodes.  Pathologic stage was ypT1c ypN0.  Original breast biopsy on 08/04/2017 revealed at least grade II invasive ductal carcinoma with DCIS.  Tumor was ER + (95%), PR+ (20%), and Her 2 neu - by FISH.  Ki67 was 80%.  CA27.29 was 17.3 on 08/06/2017.  She received neoadjuvant Femara (08/12/2017 - 08/31/2017) secondary to planned  delay in surgery (mastectomy with immediate reconstruction).  Femara was discontinued after high risk Mammaprint returned.  She received 1 cycle of neoadjuvant Taxotere and Cytoxan (09/03/2017). Cycle #2 on 10/01/2017 was truncated secondary to a Taxotere infusion reaction (back pain, SOB, shaking).  She received 3 cycles of neoadjuvant AC (10/27/2017 - 12/08/2017) with Udencya support.  She began Femara on 02/09/2018.  She is tolerating it well.  MammaPrint was high risk luminal-type B with a 10 year risk of recurrence untreated of 29% and 94.6% of distant metastasis free interval with chemotherapy + hormonal therapy.  Bilateral breast MRI on 08/20/2017 revealed a 2.4 cm enhancing irregular mass in the outer left breast.  There were several prominent lymph nodes with borderline thicked cortices between 3-4 mm, but normal morphology.  There was a 5 mm enhancing mass in the anterior, retroareolar region of the right breast.  RIGHT breast retroareolar biopsy on 09/02/2017 revealed no evidence of malignancy.  There was fibrocystic change and usual ductal hyperplasia.  Chest CT on 09/23/2016 revealed a 4 mm RUL subpleural nodule.  Chest CT on 08/17/2017 revealed a small 4 mm RUL nodule unchanged in size.  Echo on 10/14/2017 revealed an EF of 55-60%,  Bone density on 08/17/2017 revealed osteopenia with a T-score of -1.5 in the left femoral neck and -0.7 in the AP spine L1-L4.  She has a significant family history of breast cancer (paternal grandmother, sister, and 3 maternal cousins).   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  She was admitted to [facility] from 09/11/2017 - 09/15/2017 with enteropathogenic E.Coli (EPEC) and associated acute renal failure (creatinine 1.87; CrCl 28.3 ml/min). She was treated with intravenous azithromycin and ceftriaxone.   Symptomatically, she feels fatigued.  She has some joint stiffness.  Exam is unremarkable.  Plan: 1. Labs today:  CBC with diff, CMP, CA27.29. 2. Stage IA left breast cancer Clinically, she is doing well. She is s/p neoadjuvant chemotherapy followed by mastectomy.   She has completed 4 months of letrozole.  Tolerating well with minor joint complaints. Continue letrozole. 3.   Osteopenia  Patient on calcium and vitamin D.  She saw her Dr Delice Bison, her dentist in 02/2018.  Obtain clearance.  Discuss plan to start Prolia 4.    Painful intercourse  Suspect vaginal dryness due to lack of estrogen.  Discuss risk versus benefit of topical estrogens.   Topical estrogens have some systemic absorption.   Tumor is hormone receptor +.  Patient has appointment to see Dr Leafy Ro, gynecologist. 5.   RTC in 3 months for MD assessment, labs (CBC with diff, CMP, CA 27.29) and +/- Prolia.  I discussed the assessment and treatment plan with the patient.  The patient was provided an opportunity to ask questions and all were answered.  The patient agreed with the plan and demonstrated an understanding of the instructions.  The patient was advised to call back or seek an in person evaluation if the symptoms worsen or if the condition fails to improve as anticipated.    Lequita Asal, MD, PhD  06/09/2018, 11:14 AM

## 2018-06-10 LAB — CANCER ANTIGEN 27.29: CA 27.29: 17 U/mL (ref 0.0–38.6)

## 2018-06-14 ENCOUNTER — Telehealth: Payer: Self-pay | Admitting: Plastic Surgery

## 2018-06-14 NOTE — Telephone Encounter (Signed)
Called patient to confirm appointment scheduled for tomorrow. Patient answered the following questions: 1.Has the patient traveled outside of the state of [place] at all within the past 6 weeks? No 2.Does the patient have a fever or cough at all? No 3.Has the patient been tested for COVID? Had a positive COVID test? No 4. Has the patient been in contact with anyone who has tested positive? No

## 2018-06-15 ENCOUNTER — Ambulatory Visit: Payer: Medicare Other | Admitting: Hematology and Oncology

## 2018-06-15 ENCOUNTER — Ambulatory Visit (INDEPENDENT_AMBULATORY_CARE_PROVIDER_SITE_OTHER): Payer: Medicare Other | Admitting: Plastic Surgery

## 2018-06-15 ENCOUNTER — Encounter: Payer: Self-pay | Admitting: Plastic Surgery

## 2018-06-15 ENCOUNTER — Other Ambulatory Visit: Payer: Medicare Other

## 2018-06-15 ENCOUNTER — Other Ambulatory Visit: Payer: Self-pay

## 2018-06-15 VITALS — BP 122/61 | HR 77 | Temp 99.2°F | Ht 66.0 in | Wt 161.0 lb

## 2018-06-15 DIAGNOSIS — Z9012 Acquired absence of left breast and nipple: Secondary | ICD-10-CM

## 2018-06-15 NOTE — Progress Notes (Signed)
   Subjective:    Patient ID: Mariah Hogan, female    DOB: Mar 01, 1951, 68 y.o.   MRN: [id number]  The patient is a 68 year old female here for follow-up on her breast reconstruction.  She had a left mastectomy with reconstruction with implant.  The incision is healing well there is no sign of infection or seroma.  She is pleased with the results and the size.  She is not likely interested in a tattoo.  She does have some volume loss in the upper medial pole.  This would benefit from fat grafting    Review of Systems  Constitutional: Negative.   HENT: Negative.   Eyes: Negative.   Respiratory: Negative.   Cardiovascular: Negative.   Genitourinary: Negative.   Musculoskeletal: Negative.        Objective:   Physical Exam Vitals signs and nursing note reviewed.  Constitutional:      Appearance: Normal appearance.  HENT:     Head: Normocephalic.     Nose: Nose normal.     Mouth/Throat:     Mouth: Mucous membranes are moist.  Cardiovascular:     Rate and Rhythm: Normal rate.  Pulmonary:     Effort: Pulmonary effort is normal.  Neurological:     General: No focal deficit present.     Mental Status: She is alert.  Psychiatric:        Mood and Affect: Mood normal.        Behavior: Behavior normal.        Assessment & Plan:  Acquired absence of left breast  S/P mastectomy, left  Follow up in one month for evaluation for fat grafting to left breast. Picture taken with patient permission and placed in epic.

## 2018-06-16 ENCOUNTER — Other Ambulatory Visit: Payer: Medicare Other

## 2018-06-16 ENCOUNTER — Ambulatory Visit: Payer: Medicare Other | Admitting: Hematology and Oncology

## 2018-06-22 ENCOUNTER — Ambulatory Visit: Payer: Medicare Other | Admitting: Plastic Surgery

## 2018-07-21 ENCOUNTER — Ambulatory Visit (INDEPENDENT_AMBULATORY_CARE_PROVIDER_SITE_OTHER): Payer: Medicare Other | Admitting: Podiatry

## 2018-07-21 ENCOUNTER — Encounter: Payer: Self-pay | Admitting: Podiatry

## 2018-07-21 ENCOUNTER — Other Ambulatory Visit: Payer: Self-pay

## 2018-07-21 VITALS — Temp 97.7°F

## 2018-07-21 DIAGNOSIS — L600 Ingrowing nail: Secondary | ICD-10-CM

## 2018-07-21 MED ORDER — DOXYCYCLINE HYCLATE 100 MG PO TABS: 100.0000 mg | ORAL_TABLET | Freq: Two times a day (BID) | ORAL | 0 refills | Status: DC

## 2018-07-21 MED ORDER — NEOMYCIN-POLYMYXIN-HC 1 % OT SOLN: OTIC | 1 refills | Status: DC

## 2018-07-21 NOTE — Patient Instructions (Signed)

## 2018-07-21 NOTE — Progress Notes (Signed)
She presents today chief complaint of ingrown toenail to the fibular border fourth toe right foot.  States is tender red and swollen there is some pus coming out of it.  Objective: Vital signs temperature alert oriented x3 pulses are palpable.  Sharp incurvated nail margin along the fibular border the fourth toe right foot.  Assessment: Ingrown nail paronychia abscess fourth toe right fibular border.  Plan: After 3 cc of a 50-50 Marcaine plain lidocaine plain was infiltrated chemical matricectomy was performed to the fibular border.  She tolerated procedure well without complications.  She is given both oral and written home-going structures as well as a prescription of Cortisporin otic.  And I also prescribed her doxycycline.  Follow-up with her in 2 to 3 weeks.

## 2018-08-04 ENCOUNTER — Ambulatory Visit (INDEPENDENT_AMBULATORY_CARE_PROVIDER_SITE_OTHER): Payer: Medicare Other | Admitting: Podiatry

## 2018-08-04 ENCOUNTER — Encounter: Payer: Self-pay | Admitting: Podiatry

## 2018-08-04 ENCOUNTER — Other Ambulatory Visit: Payer: Self-pay

## 2018-08-04 VITALS — Temp 97.1°F

## 2018-08-04 DIAGNOSIS — L600 Ingrowing nail: Secondary | ICD-10-CM

## 2018-08-04 DIAGNOSIS — Z9889 Other specified postprocedural states: Secondary | ICD-10-CM

## 2018-08-04 NOTE — Progress Notes (Signed)
She presents today for follow-up of her matrixectomy lateral aspect fourth toe right foot.  States that is doing fine.  Objective: Vital signs are stable no erythema edema cellulitis drainage odor small eschar is present there is no signs of infection.  Nontender on palpation.  Assessment: Well-healing surgical foot.  Plan: Discontinue Betadine and warm water soaks she can and may continue the Cortisporin Otic drops and cover during the daytime.  Follow-up with me as needed

## 2018-08-30 ENCOUNTER — Telehealth: Payer: Self-pay | Admitting: Plastic Surgery

## 2018-08-30 ENCOUNTER — Other Ambulatory Visit: Payer: Self-pay

## 2018-08-30 NOTE — Progress Notes (Signed)
[facility]  [address] [place], [place] [address] Phone: [phone]  Fax: [phone]   Clinic Day:  08/31/2018  Referring physician: Marinda Elk, MD  Chief Complaint: Mariah Hogan is a 68 y.o. female with clinical stage IA left breast cancer on Femara who is seen for 5 month assessment.   HPI: The patient was last seen in the medical oncology clinic on 06/09/2018. At that time, she felt fatigued.  She had some joint stiffness.  Exam was unremarkable.  Labs were normal.  She continued Femara.  She saw Dr. Marla Roe, plastic surgeon, on 06/15/2018.  She will have a follow up in 1 month for evaluation for fat grafting to left breast.   She saw Dr. Milinda Pointer for an ingrown nail paronychia abscess fourth toe right fibular border on 07/21/2018. She underwent chemical matrixectomy. She was prescribed doxycycline.  She was doing well on her 2 week assessment on 08/04/2018.  Labs on 06/09/2018:  WBC 5,100, ANC 3,000, hemoglobin 12.9, hematocrit 37.3, platelets 203,000, creatinine 0.90, TSH 3.201.  CA 27.29 was 17.0.   During the interim, the patient is doing okay. She has low energy which she attributes to breast cancer, and is fatigued. She has shortness of breath with excertion, and occasional nausea.  She experiences ocassional diarrhea that is more like "loose stool."  Her weight is down 3 lbs.  She is not interested in getting a Prolia shot today.  She will have a follow up with Dr. Marla Roe today.    Past Medical History:  Diagnosis Date  . Anemia   . Arthritis   . Asthma    H/O YEARS AGO-NOW HAS COUGHING SPELLS WHICH IS WHY SHE HAS HER ALBUTEROL INHALER  . BRCA gene mutation negative 08/12/2017   Variant of unknown significance at [facility] only abnormality.  . Breast cancer ([place]) 08/04/2017   1.8 cm ER 90%, PR 20%, HER-2/neu negative invasive mammary carcinoma of the left upper outer quadrant.  MammaPrint: High risk.  . Cancer ([place])     [phone] basal cell carcinoma  . Complication of anesthesia    HARD TIME WAKING UP  . Dysrhythmia    TACHYCARDIA  . GERD (gastroesophageal reflux disease)   . History of hiatal hernia    SMALL  . Hyperlipidemia   . Hypertension    PT STATES IN 01-18-18 PREOP PHONE INTERVIEW THAT HER BP HAS BEEN ELEVATED SINCE STARTING ON VALSARTAN- PCP SWITCHED PT BACK TO MAXIDE ON 01-18-18 AND PT STATES THAT SHE HAS LOST 6-10 POUNDS OF FLUID SINCE RESTARTING MAXIDE.    Marland Kitchen PONV (postoperative nausea and vomiting)     Past Surgical History:  Procedure Laterality Date  . ABDOMINAL HYSTERECTOMY  1998  . bladder tack  3220,2542  . BREAST BIOPSY Bilateral   . BREAST BIOPSY Left 1990  . BREAST BIOPSY Left 2018   core bx- neg  . 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  Marland Kitchen BREAST CYST EXCISION  x3  . BREAST RECONSTRUCTION WITH PLACEMENT OF TISSUE EXPANDER AND FLEX HD (ACELLULAR HYDRATED DERMIS) Left 01/27/2018   Procedure: BREAST RECONSTRUCTION WITH PLACEMENT OF TISSUE EXPANDER AND FLEX HD (ACELLULAR HYDRATED DERMIS);  Surgeon: Wallace Going, DO;  Location: [facility] ORS;  Service: Plastics;  Laterality: Left;  . BREAST SURGERY Left 02/13/14   Fibrocystic changes, pseudo-angiomatous stromal hyperplasia. No atypia or malignancy.  . CARPAL TUNNEL RELEASE  x2  . CHOLECYSTECTOMY  2008  . COLONOSCOPY WITH  PROPOFOL N/A 01/12/2015   Procedure: COLONOSCOPY WITH PROPOFOL;  Surgeon: Manya Silvas, MD;  Location: [facility] ENDOSCOPY;  Service: Endoscopy;  Laterality: N/A;  . ESOPHAGOGASTRODUODENOSCOPY (EGD) WITH PROPOFOL N/A 01/12/2015   Procedure: ESOPHAGOGASTRODUODENOSCOPY (EGD) WITH PROPOFOL;  Surgeon: Manya Silvas, MD;  Location: [facility] ENDOSCOPY;  Service: Endoscopy;  Laterality: N/A;  . MASTECTOMY W/ SENTINEL NODE BIOPSY Left 01/27/2018   ypT1c ypN0; ER 90%, PR 20%, HER-2/neu not overexpressed.  MASTECTOMY WITH SENTINEL LYMPH NODE BIOPSY;  Surgeon: Robert Bellow,  MD;  Location: [facility] ORS;  Service: General;  Laterality: Left;  . MINOR BREAST BIOPSY Right 09/02/2017   Radiology perform procedure, fibrocystic changes right retroareolar area.  Marland Kitchen NASAL RECONSTRUCTION  1983  . PORTACATH PLACEMENT N/A 10/21/2017   Procedure: INSERTION PORT-A-CATH;  Surgeon: Robert Bellow, MD;  Location: [facility] ORS;  Service: General;  Laterality: N/A;  . REMOVAL OF TISSUE EXPANDER AND PLACEMENT OF IMPLANT Left 05/13/2018   Procedure: REMOVAL OF TISSUE EXPANDER AND PLACEMENT OF IMPLANT;  Surgeon: Wallace Going, DO;  Location: [facility] ORS;  Service: Plastics;  Laterality: Left;  total surgery time should be 3 hours, per provider  . SAVORY DILATION N/A 01/12/2015   Procedure: SAVORY DILATION;  Surgeon: Manya Silvas, MD;  Location: [facility] ENDOSCOPY;  Service: Endoscopy;  Laterality: N/A;  . TONSILLECTOMY AND ADENOIDECTOMY  1956    Family History  Problem Relation Age of Onset  . Breast cancer Sister 43       currently 5  . Breast cancer Paternal Grandmother 17       bilateral; deceased 53s   . Breast cancer Cousin 89       daughter of an unaffected maternal aunt  . Breast cancer Cousin 24       kidney cancer also; daughter of an unaffected maternal aunt  . Other Mother        TAH/BSO prior to menopause  . Prostate cancer Paternal Grandfather        age at dx unknown  . Breast cancer Maternal Aunt        age at dx unknown  . Breast cancer Cousin 73       daughter of an unaffected maternal aunt    Social History:  reports that she has never smoked. She has never used smokeless tobacco. She reports that she does not drink alcohol or use drugs.Her daughter, Mariah Hogan, is a gastroenterology PA. She is a retired Customer service manager for 35 years. Patient denies known exposures to radiation on toxins. The patient is alone today.  Allergies:  Allergies  Allergen Reactions  . Penicillin G Shortness Of Breath, Swelling, Rash and Other (See Comments)    Has patient had a PCN  reaction causing immediate rash, facial/tongue/throat swelling, SOB or lightheadedness with hypotension: Yes Has patient had a PCN reaction causing severe rash involving mucus membranes or skin necrosis: No Has patient had a PCN reaction that required hospitalization: No Has patient had a PCN reaction occurring within the last 10 years: No If all of the above answers are "NO", then may proceed with Cephalosporin use.  [name] [Docetaxel] Shortness Of Breath, Palpitations and Other (See Comments)    Back pain, Back Spasms  . Other Other (See Comments)    Dodecyl gallate: Positive patch test  Elta MD UV Clear SPF 46: Positive patch test  fragrance dodocylgallate dodocylgallate  . Parabens Other (See Comments)    Positive patch test   . Shellac Other (See Comments)  Positive patch test   . Fluocinonide Rash  . Latex Rash  . Tape Dermatitis  . Triamcinolone Rash    Current Medications: Current Outpatient Medications  Medication Sig Dispense Refill  . atenolol (TENORMIN) 50 MG tablet Take 50 mg by mouth every morning.     . cetirizine (ZYRTEC) 10 MG tablet Take 10 mg by mouth daily before lunch.     . citalopram (CELEXA) 20 MG tablet Take 20 mg by mouth at bedtime.     . Desoximetasone (TOPICORT) 0.25 % ointment     . letrozole (FEMARA) 2.5 MG tablet Take 1 tablet (2.5 mg total) by mouth daily. Future refill will be AFTER MD visit and lab check. 90 tablet 3  . NEOMYCIN-POLYMYXIN-HYDROCORTISONE (CORTISPORIN) 1 % SOLN OTIC solution Apply 1-2 drops to toe BID after soaking 10 mL 1  . triamterene-hydrochlorothiazide (MAXZIDE-25) 37.5-25 MG tablet Take 1 tablet by mouth every morning.    . valACYclovir (VALTREX) 1000 MG tablet Take 1,000 mg by mouth See admin instructions. Take 1000 mg in the morning , may take a second 1000 mg dose as needed for fever blisters     . albuterol (PROVENTIL HFA;VENTOLIN HFA) 108 (90 Base) MCG/ACT inhaler Inhale 2 puffs into the lungs every 6 (six) hours as  needed for wheezing or shortness of breath. (Patient not taking: Reported on 08/31/2018) 1 Inhaler 1  . pantoprazole (PROTONIX) 40 MG tablet Take 40 mg by mouth 2 (two) times daily as needed (heartburn).     Marland Kitchen PREMARIN vaginal cream      No current facility-administered medications for this visit.     Review of Systems  Constitutional: Positive for weight loss (down 3 lbs.). Negative for chills, diaphoresis, fever and malaise/fatigue.       Doing okay.  HENT: Negative.  Negative for congestion, ear discharge, ear pain, nosebleeds, sinus pain, sore throat and tinnitus.   Eyes: Negative.  Negative for blurred vision, double vision, photophobia and pain.  Respiratory: Positive for shortness of breath (with exertion). Negative for cough, hemoptysis, sputum production and wheezing.   Cardiovascular: Negative.  Negative for chest pain, palpitations, orthopnea, leg swelling and PND.  Gastrointestinal: Positive for diarrhea (loose stools) and nausea (little in the morning; crackers help). Negative for abdominal pain, blood in stool, constipation, heartburn, melena and vomiting.  Genitourinary: Negative.  Negative for dysuria, frequency, hematuria and urgency.       Urine leakage.  Painful intercourse.  Musculoskeletal: Negative.  Negative for back pain, falls, joint pain, myalgias and neck pain.       Left 4th digit a little stiff and can get stuck.  Skin: Negative.  Negative for itching and rash.  Neurological: Negative.  Negative for dizziness, tremors, sensory change, speech change, focal weakness, weakness and headaches.  Endo/Heme/Allergies: Negative.  Does not bruise/bleed easily.  Psychiatric/Behavioral: Negative.  Negative for depression and memory loss. The patient is not nervous/anxious and does not have insomnia.   All other systems reviewed and are negative.  Performance status (ECOG): 1  Blood pressure 115/67, pulse 82, temperature 97.8 F (36.6 C), temperature source Tympanic, resp.  rate 18, height _0  (1.676 m), weight 159 lb 13.3 oz (72.5 kg), SpO2 99 %.  Physical Exam  Constitutional: She is oriented to person, place, and time. She appears well-developed and well-nourished. No distress.  HENT:  Head: Normocephalic and atraumatic.  Mouth/Throat: No oropharyngeal exudate.  Long brown wig.  Mask.  Eyes: Pupils are equal, round, and reactive to light. Conjunctivae  and EOM are normal. No scleral icterus.  Blue eyes.  Neck: Normal range of motion. Neck supple. No JVD present.  Cardiovascular: Normal rate, regular rhythm and normal heart sounds.  No murmur heard. Pulmonary/Chest: Effort normal and breath sounds normal. No respiratory distress. She has no wheezes. She has no rales. Left breast exhibits no skin change (slight inferior edema) and no tenderness.  s/p left mastectomy with saline implant; skin tight.  Abdominal: Soft. Bowel sounds are normal. She exhibits no mass. There is no abdominal tenderness. There is no rebound and no guarding.  Musculoskeletal: Normal range of motion.        General: No edema.  Lymphadenopathy:    She has no cervical adenopathy.    She has no axillary adenopathy.       Right: No supraclavicular adenopathy present.       Left: No supraclavicular adenopathy present.  Neurological: She is alert and oriented to person, place, and time. She has normal reflexes.  Skin: Skin is warm and dry. No rash noted. She is not diaphoretic. No erythema. No pallor.  Psychiatric: She has a normal mood and affect. Her behavior is normal. Judgment and thought content normal.  Nursing note and vitals reviewed.    Appointment on 08/31/2018  Component Date Value Ref Range Status  . WBC 08/31/2018 6.2  4.0 - 10.5 K/uL Final  . RBC 08/31/2018 4.58  3.87 - 5.11 MIL/uL Final  . Hemoglobin 08/31/2018 14.4  12.0 - 15.0 g/dL Final  . HCT 08/31/2018 41.6  36.0 - 46.0 % Final  . MCV 08/31/2018 90.8  80.0 - 100.0 fL Final  . MCH 08/31/2018 31.4  26.0 - 34.0 pg  Final  . MCHC 08/31/2018 34.6  30.0 - 36.0 g/dL Final  . RDW 08/31/2018 12.1  11.5 - 15.5 % Final  . Platelets 08/31/2018 239  150 - 400 K/uL Final  . nRBC 08/31/2018 0.0  0.0 - 0.2 % Final  . Neutrophils Relative % 08/31/2018 64  % Final  . Neutro Abs 08/31/2018 3.9  1.7 - 7.7 K/uL Final  . Lymphocytes Relative 08/31/2018 24  % Final  . Lymphs Abs 08/31/2018 1.5  0.7 - 4.0 K/uL Final  . Monocytes Relative 08/31/2018 6  % Final  . Monocytes Absolute 08/31/2018 0.4  0.1 - 1.0 K/uL Final  . Eosinophils Relative 08/31/2018 5  % Final  . Eosinophils Absolute 08/31/2018 0.3  0.0 - 0.5 K/uL Final  . Basophils Relative 08/31/2018 1  % Final  . Basophils Absolute 08/31/2018 0.0  0.0 - 0.1 K/uL Final  . Immature Granulocytes 08/31/2018 0  % Final  . Abs Immature Granulocytes 08/31/2018 0.02  0.00 - 0.07 K/uL Final   Performed at [facility], [address], [place], [place] [address]    Assessment:  Mariah Hogan is a 68 y.o. female with clinical stage IA left breast cancer s/p neoadjuvant chemotherapy followed by mastectomy,sentinel lymph node biopsy, and breast reconstruction with placement of a tissue expander on 01/27/2018.  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  There was no lymphovascular invasion.  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  Margins were negative.  There were 2 negative sentinel lymph nodes.  Pathologic stage was ypT1c ypN0.  Original breast biopsy on 08/04/2017 revealed at least grade II invasive ductal carcinoma with DCIS.  Tumor was ER + (95%), PR+ (20%), and Her 2 neu - by FISH.  Ki67 was 80%.  MammaPrint was high risk luminal-type B with a 10 year risk of recurrence untreated of 29% and 94.6% of distant metastasis free interval with chemotherapy + hormonal therapy.  She received neoadjuvant Femara (08/12/2017 - 08/31/2017) secondary to planned  delay in surgery (mastectomy with immediate reconstruction).  Femara was  discontinued after high risk Mammaprint returned.  She received 1 cycle of neoadjuvant Taxotere and Cytoxan (09/03/2017). Cycle #2 on 10/01/2017 was truncated secondary to a Taxotere infusion reaction (back pain, SOB, shaking).  She received 3 cycles of neoadjuvant AC (10/27/2017 - 12/08/2017) with Udencya support.  She began Femara on 02/09/2018.  She is tolerating it well.  CA27.29 has been followed:  17.3 on 08/06/2017 and 17.0 on 06/09/2018.  Bilateral breast MRI on 08/20/2017 revealed a 2.4 cm enhancing irregular mass in the outer left breast.  There were several prominent lymph nodes with borderline thicked cortices between 3-4 mm, but normal morphology.  There was a 5 mm enhancing mass in the anterior, retroareolar region of the right breast.  RIGHT breast retroareolar biopsy on 09/02/2017 revealed no evidence of malignancy.  There was fibrocystic change and usual ductal hyperplasia.  Chest CT on 09/23/2016 revealed a 4 mm RUL subpleural nodule.  Chest CT on 08/17/2017 revealed a small 4 mm RUL nodule unchanged in size.  Echo on 10/14/2017 revealed an EF of 55-60%,  Bone density on 08/17/2017 revealed osteopenia with a T-score of -1.5 in the left femoral neck and -0.7 in the AP spine L1-L4.  She has a significant family history of breast cancer (paternal grandmother, sister, and 3 maternal cousins).   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  She was admitted to [facility] from 09/11/2017 - 09/15/2017 with enteropathogenic E.Coli (EPEC) and associated acute renal failure (creatinine 1.87; CrCl 28.3 ml/min). She was treated with intravenous azithromycin and ceftriaxone.   Symptomatically, she is fatigued.  she has occasional nausea and diarrhea.  She has shortness of breath with walking.  She denies any breast concerns.  Plan: 1.   Labs today:  CBC with diff, CMP, CA27.29. 2.   Stage IA left breast cancer  Clinically she is doing well.  She is status post neoadjuvant chemotherapy followed by mastectomy.  She is tolerating letrozole well. Anticipate mammogram on 10/14/2018. 3.   Osteopenia             She is on calcium and vitamin D.              She saw her Dr Delice Bison, her dentist in 02/2018.             She declines Prolia today. 4.     RTC in 3 months for MD assessment, labs (CBC with differential, CMP, CA27.29).  I discussed the assessment and treatment plan with the patient.  The patient was provided an opportunity to ask questions and all were answered.  The patient agreed with the plan and demonstrated an understanding of the instructions.  The patient was advised to call back if the symptoms worsen or if the condition fails to improve as anticipated.   Lequita Asal, MD, PhD    08/31/2018, 8:44 AM  I, Selena Batten, am acting as scribe for [employer]. Mike Gip, MD, PhD.  I, Melissa C. Mike Gip, MD, have reviewed the above documentation for accuracy and completeness, and I agree with the above.

## 2018-08-30 NOTE — Telephone Encounter (Signed)

## 2018-08-31 ENCOUNTER — Encounter: Payer: Self-pay | Admitting: Plastic Surgery

## 2018-08-31 ENCOUNTER — Inpatient Hospital Stay: Payer: Medicare Other

## 2018-08-31 ENCOUNTER — Ambulatory Visit (INDEPENDENT_AMBULATORY_CARE_PROVIDER_SITE_OTHER): Payer: Medicare Other | Admitting: Plastic Surgery

## 2018-08-31 ENCOUNTER — Inpatient Hospital Stay: Payer: Medicare Other | Attending: Hematology and Oncology | Admitting: Hematology and Oncology

## 2018-08-31 ENCOUNTER — Encounter: Payer: Self-pay | Admitting: Hematology and Oncology

## 2018-08-31 VITALS — BP 115/67 | HR 82 | Temp 97.8°F | Resp 18 | Ht 66.0 in | Wt 159.8 lb

## 2018-08-31 VITALS — BP 111/69 | HR 85 | Temp 98.9°F | Ht 66.5 in | Wt 160.2 lb

## 2018-08-31 DIAGNOSIS — M1990 Unspecified osteoarthritis, unspecified site: Secondary | ICD-10-CM | POA: Diagnosis not present

## 2018-08-31 DIAGNOSIS — Z170 Estrogen receptor positive status [ER+]: Secondary | ICD-10-CM | POA: Diagnosis not present

## 2018-08-31 DIAGNOSIS — K449 Diaphragmatic hernia without obstruction or gangrene: Secondary | ICD-10-CM | POA: Insufficient documentation

## 2018-08-31 DIAGNOSIS — Z9012 Acquired absence of left breast and nipple: Secondary | ICD-10-CM

## 2018-08-31 DIAGNOSIS — Z79811 Long term (current) use of aromatase inhibitors: Secondary | ICD-10-CM

## 2018-08-31 DIAGNOSIS — N651 Disproportion of reconstructed breast: Secondary | ICD-10-CM

## 2018-08-31 DIAGNOSIS — M8580 Other specified disorders of bone density and structure, unspecified site: Secondary | ICD-10-CM | POA: Insufficient documentation

## 2018-08-31 DIAGNOSIS — K219 Gastro-esophageal reflux disease without esophagitis: Secondary | ICD-10-CM | POA: Insufficient documentation

## 2018-08-31 DIAGNOSIS — C50912 Malignant neoplasm of unspecified site of left female breast: Secondary | ICD-10-CM

## 2018-08-31 DIAGNOSIS — J45909 Unspecified asthma, uncomplicated: Secondary | ICD-10-CM | POA: Insufficient documentation

## 2018-08-31 DIAGNOSIS — Z9221 Personal history of antineoplastic chemotherapy: Secondary | ICD-10-CM | POA: Insufficient documentation

## 2018-08-31 DIAGNOSIS — I10 Essential (primary) hypertension: Secondary | ICD-10-CM | POA: Insufficient documentation

## 2018-08-31 DIAGNOSIS — Z79899 Other long term (current) drug therapy: Secondary | ICD-10-CM | POA: Diagnosis not present

## 2018-08-31 DIAGNOSIS — M85852 Other specified disorders of bone density and structure, left thigh: Secondary | ICD-10-CM

## 2018-08-31 DIAGNOSIS — M810 Age-related osteoporosis without current pathological fracture: Secondary | ICD-10-CM | POA: Diagnosis not present

## 2018-08-31 DIAGNOSIS — E785 Hyperlipidemia, unspecified: Secondary | ICD-10-CM | POA: Insufficient documentation

## 2018-08-31 DIAGNOSIS — Z803 Family history of malignant neoplasm of breast: Secondary | ICD-10-CM | POA: Diagnosis not present

## 2018-08-31 LAB — CBC WITH DIFFERENTIAL/PLATELET
Abs Immature Granulocytes: 0.02 10*3/uL (ref 0.00–0.07)
Basophils Absolute: 0 10*3/uL (ref 0.0–0.1)
Basophils Relative: 1 %
Eosinophils Absolute: 0.3 10*3/uL (ref 0.0–0.5)
Eosinophils Relative: 5 %
HCT: 41.6 % (ref 36.0–46.0)
Hemoglobin: 14.4 g/dL (ref 12.0–15.0)
Immature Granulocytes: 0 %
Lymphocytes Relative: 24 %
Lymphs Abs: 1.5 10*3/uL (ref 0.7–4.0)
MCH: 31.4 pg (ref 26.0–34.0)
MCHC: 34.6 g/dL (ref 30.0–36.0)
MCV: 90.8 fL (ref 80.0–100.0)
Monocytes Absolute: 0.4 10*3/uL (ref 0.1–1.0)
Monocytes Relative: 6 %
Neutro Abs: 3.9 10*3/uL (ref 1.7–7.7)
Neutrophils Relative %: 64 %
Platelets: 239 10*3/uL (ref 150–400)
RBC: 4.58 MIL/uL (ref 3.87–5.11)
RDW: 12.1 % (ref 11.5–15.5)
WBC: 6.2 10*3/uL (ref 4.0–10.5)
nRBC: 0 % (ref 0.0–0.2)

## 2018-08-31 LAB — COMPREHENSIVE METABOLIC PANEL
ALT: 19 U/L (ref 0–44)
AST: 17 U/L (ref 15–41)
Albumin: 4 g/dL (ref 3.5–5.0)
Alkaline Phosphatase: 108 U/L (ref 38–126)
Anion gap: 11 (ref 5–15)
BUN: 21 mg/dL (ref 8–23)
CO2: 26 mmol/L (ref 22–32)
Calcium: 9.4 mg/dL (ref 8.9–10.3)
Chloride: 101 mmol/L (ref 98–111)
Creatinine, Ser: 1.09 mg/dL — ABNORMAL HIGH (ref 0.44–1.00)
GFR calc Af Amer: 60 mL/min — ABNORMAL LOW (ref >60)
GFR calc non Af Amer: 51 mL/min — ABNORMAL LOW (ref >60)
Glucose, Bld: 120 mg/dL — ABNORMAL HIGH (ref 70–99)
Potassium: 4 mmol/L (ref 3.5–5.1)
Sodium: 138 mmol/L (ref 135–145)
Total Bilirubin: 0.8 mg/dL (ref 0.3–1.2)
Total Protein: 6.9 g/dL (ref 6.5–8.1)

## 2018-08-31 NOTE — Progress Notes (Signed)
No new changes noted today 

## 2018-08-31 NOTE — Progress Notes (Signed)
   Subjective:    Patient ID: Mariah Hogan, female    DOB: [birth year], 68 y.o.   MRN: [id number]  The patient is a 68 yrs old wf here for evaluation and follow up of her breast reconstruction.  She has noticed the left implant moving laterally.  On exam she looks very good and healthy.  There is mild asymmetry particularly in the upper pole of the left breast.  She has lost fullness.  This is typical for the atrophy that can occur after implant placement.  The implant does have some movement but is normal healthy movement.  I explained to the patient it is the lack of a capsular contracture which is good.  She would like to have better contour and a better appearance in her shirts.  The sunken upper pole is visible with a modest shirt.  All incisions are healing very well and overall she is doing extremely well.   Review of Systems  Constitutional: Negative for activity change and appetite change.  HENT: Negative.   Eyes: Negative.   Respiratory: Negative.   Cardiovascular: Negative for leg swelling.  Gastrointestinal: Negative.  Negative for abdominal distention.  Genitourinary: Negative.   Musculoskeletal: Negative for joint swelling.  Psychiatric/Behavioral: Negative.        Objective:   Physical Exam Vitals signs and nursing note reviewed.  Constitutional:      Appearance: Normal appearance.  HENT:     Head: Normocephalic and atraumatic.  Cardiovascular:     Rate and Rhythm: Normal rate.     Pulses: Normal pulses.  Pulmonary:     Effort: Pulmonary effort is normal.  Abdominal:     General: Abdomen is flat. There is no distension.     Tenderness: There is no abdominal tenderness.  Skin:    General: Skin is warm.  Neurological:     General: No focal deficit present.     Mental Status: She is alert and oriented to person, place, and time.  Psychiatric:        Mood and Affect: Mood normal.        Behavior: Behavior normal.        Assessment & Plan:     ICD-10-CM    1. Acquired absence of left breast  Z90.12   2. Breast asymmetry following reconstructive surgery  N65.1   3. Invasive ductal carcinoma of left breast ([place])  C50.912      Recommend left breast reconstruction with fat filling.  Pictures were obtained of the patient and placed in the chart with the patient's or guardian's permission.

## 2018-09-01 LAB — CANCER ANTIGEN 27.29: CA 27.29: 25.2 U/mL (ref 0.0–38.6)

## 2018-09-06 ENCOUNTER — Other Ambulatory Visit: Payer: Self-pay | Admitting: *Deleted

## 2018-09-06 ENCOUNTER — Telehealth: Payer: Self-pay | Admitting: *Deleted

## 2018-09-06 DIAGNOSIS — R928 Other abnormal and inconclusive findings on diagnostic imaging of breast: Secondary | ICD-10-CM

## 2018-09-06 DIAGNOSIS — Z1231 Encounter for screening mammogram for malignant neoplasm of breast: Secondary | ICD-10-CM

## 2018-09-06 DIAGNOSIS — N6314 Unspecified lump in the right breast, lower inner quadrant: Secondary | ICD-10-CM

## 2018-09-06 NOTE — Telephone Encounter (Signed)
Patient called and stated that she wants to be scheduled for her mammogram and to come back to see Dr.Byrnett as soon as she can, I told her that things have been pushed back to due to the virus and that we will get her scheduled as soon as we can

## 2018-09-07 ENCOUNTER — Ambulatory Visit: Payer: Medicare Other | Admitting: Plastic Surgery

## 2018-09-08 ENCOUNTER — Other Ambulatory Visit: Payer: Medicare Other

## 2018-09-08 ENCOUNTER — Ambulatory Visit: Payer: Medicare Other | Admitting: Hematology and Oncology

## 2018-09-08 ENCOUNTER — Ambulatory Visit: Payer: Medicare Other

## 2018-10-14 ENCOUNTER — Ambulatory Visit
Admission: RE | Admit: 2018-10-14 | Discharge: 2018-10-14 | Disposition: A | Discharge status: Home / Self Care | Payer: Medicare Other | Source: Ambulatory Visit | Attending: General Surgery | Admitting: General Surgery

## 2018-10-14 ENCOUNTER — Other Ambulatory Visit: Payer: Self-pay | Admitting: *Deleted

## 2018-10-14 ENCOUNTER — Encounter: Payer: Self-pay | Admitting: *Deleted

## 2018-10-14 DIAGNOSIS — C50912 Malignant neoplasm of unspecified site of left female breast: Secondary | ICD-10-CM

## 2018-10-14 DIAGNOSIS — Z853 Personal history of malignant neoplasm of breast: Secondary | ICD-10-CM

## 2018-10-14 DIAGNOSIS — N6310 Unspecified lump in the right breast, unspecified quadrant: Secondary | ICD-10-CM

## 2018-10-14 DIAGNOSIS — Z1231 Encounter for screening mammogram for malignant neoplasm of breast: Secondary | ICD-10-CM | POA: Insufficient documentation

## 2018-10-14 HISTORY — DX: Personal history of antineoplastic chemotherapy: Z92.21

## 2018-10-14 NOTE — Progress Notes (Signed)
Patient came by the office and states she was scheduled for a uni right screening mammo for today at [facility].   The patient reported a right breast mass to the staff who then did not complete mammogram as patient needed to have a diagnostic mammogram. The patient had a left mastectomy done previously. Patient was told they did not have time to do diagnostic mammogram today.   Georgina Peer contacted Dr. Dahlia Byes who states it is okay to order diagnostic mammogram.   Order placed in Epic for diagnostic mammogram.   I did try and call [facility] to get this scheduled for the patient but had to leave a message.   Patient aware that they will be calling her directly to arrange. She states she can go anytime.   The patient is aware that if mammogram is not completed prior to office visit scheduled with Dr. Dahlia Byes for next week that she will need to reschedule.

## 2018-10-18 ENCOUNTER — Ambulatory Visit
Admission: RE | Admit: 2018-10-18 | Discharge: 2018-10-18 | Disposition: A | Discharge status: Home / Self Care | Payer: Medicare Other | Source: Ambulatory Visit | Attending: Surgery | Admitting: Surgery

## 2018-10-18 ENCOUNTER — Other Ambulatory Visit: Payer: Self-pay

## 2018-10-18 DIAGNOSIS — Z853 Personal history of malignant neoplasm of breast: Secondary | ICD-10-CM

## 2018-10-18 DIAGNOSIS — N6310 Unspecified lump in the right breast, unspecified quadrant: Secondary | ICD-10-CM | POA: Diagnosis present

## 2018-10-18 DIAGNOSIS — C50912 Malignant neoplasm of unspecified site of left female breast: Secondary | ICD-10-CM | POA: Insufficient documentation

## 2018-10-20 ENCOUNTER — Ambulatory Visit: Payer: Medicare Other | Admitting: Surgery

## 2018-10-21 ENCOUNTER — Ambulatory Visit: Payer: Medicare Other | Admitting: General Surgery

## 2018-10-25 ENCOUNTER — Other Ambulatory Visit: Payer: Medicare Other

## 2018-10-26 ENCOUNTER — Ambulatory Visit: Payer: Medicare Other | Admitting: General Surgery

## 2018-10-27 ENCOUNTER — Other Ambulatory Visit: Payer: Self-pay

## 2018-10-27 ENCOUNTER — Encounter: Payer: Self-pay | Admitting: Surgery

## 2018-10-27 ENCOUNTER — Ambulatory Visit (INDEPENDENT_AMBULATORY_CARE_PROVIDER_SITE_OTHER): Payer: Medicare Other | Admitting: Surgery

## 2018-10-27 VITALS — BP 124/70 | HR 72 | Temp 97.7°F | Ht 66.0 in | Wt 161.0 lb

## 2018-10-27 DIAGNOSIS — N6310 Unspecified lump in the right breast, unspecified quadrant: Secondary | ICD-10-CM

## 2018-10-27 DIAGNOSIS — C50912 Malignant neoplasm of unspecified site of left female breast: Secondary | ICD-10-CM

## 2018-10-27 NOTE — Patient Instructions (Addendum)
We will set you up with a breast MRI  Follow up in 3 weeks.

## 2018-10-28 NOTE — Progress Notes (Signed)
Outpatient Surgical Follow Up  10/28/2018  Mariah Hogan is an 68 y.o. female.   Chief Complaint  Patient presents with  . Follow-up    HPI: 68 year old female status post left mastectomy for invasive breast cancer and reconstruction.  She now comes for a new nodule on the right breast that is located at 5:00.  I have personally reviewed the imaging studies including a mammogram and ultrasound and this has failed to reveal any discrete lesions.  She complains of some left-sided tenderness that is intermittent that is mild to moderate intensity.  There is no evidence of fevers chills or any other complications related to her implant.  Past Medical History:  Diagnosis Date  . Anemia   . Arthritis   . Asthma    H/O YEARS AGO-NOW HAS COUGHING SPELLS WHICH IS WHY SHE HAS HER ALBUTEROL INHALER  . BRCA gene mutation negative 08/12/2017   Variant of unknown significance at [facility] only abnormality.  . Breast cancer ([place]) 08/04/2017   1.8 cm ER 90%, PR 20%, HER-2/neu negative invasive mammary carcinoma of the left upper outer quadrant.  MammaPrint: High risk.  . Cancer ([place])    [phone] basal cell carcinoma  . Complication of anesthesia    HARD TIME WAKING UP  . Dysrhythmia    TACHYCARDIA  . GERD (gastroesophageal reflux disease)   . History of hiatal hernia    SMALL  . Hyperlipidemia   . Hypertension    PT STATES IN 01-18-18 PREOP PHONE INTERVIEW THAT HER BP HAS BEEN ELEVATED SINCE STARTING ON VALSARTAN- PCP SWITCHED PT BACK TO MAXIDE ON 01-18-18 AND PT STATES THAT SHE HAS LOST 6-10 POUNDS OF FLUID SINCE RESTARTING MAXIDE.    Marland Kitchen Personal history of chemotherapy   . PONV (postoperative nausea and vomiting)     Past Surgical History:  Procedure Laterality Date  . ABDOMINAL HYSTERECTOMY  1998  . bladder tack  5284,[id number]  . BREAST BIOPSY Left 1990  . BREAST BIOPSY Left 2018   core bx- neg  . 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  Marland Kitchen BREAST  BIOPSY Right 08/2017   MRI biopsy, FIBROCYSTIC CHANGE AND USUAL DUCTAL HYPERPLASIA,, clip placed  . BREAST CYST EXCISION  x3  . BREAST EXCISIONAL BIOPSY Bilateral   . BREAST RECONSTRUCTION WITH PLACEMENT OF TISSUE EXPANDER AND FLEX HD (ACELLULAR HYDRATED DERMIS) Left 01/27/2018   Procedure: BREAST RECONSTRUCTION WITH PLACEMENT OF TISSUE EXPANDER AND FLEX HD (ACELLULAR HYDRATED DERMIS);  Surgeon: Wallace Going, DO;  Location: [facility] ORS;  Service: Plastics;  Laterality: Left;  . BREAST SURGERY Left 02/13/14   Fibrocystic changes, pseudo-angiomatous stromal hyperplasia. No atypia or malignancy.  . CARPAL TUNNEL RELEASE  x2  . CHOLECYSTECTOMY  2008  . COLONOSCOPY WITH PROPOFOL N/A 01/12/2015   Procedure: COLONOSCOPY WITH PROPOFOL;  Surgeon: Manya Silvas, MD;  Location: [facility] ENDOSCOPY;  Service: Endoscopy;  Laterality: N/A;  . ESOPHAGOGASTRODUODENOSCOPY (EGD) WITH PROPOFOL N/A 01/12/2015   Procedure: ESOPHAGOGASTRODUODENOSCOPY (EGD) WITH PROPOFOL;  Surgeon: Manya Silvas, MD;  Location: [facility] ENDOSCOPY;  Service: Endoscopy;  Laterality: N/A;  . MASTECTOMY Left 2019   [place]  . MASTECTOMY W/ SENTINEL NODE BIOPSY Left 01/27/2018   ypT1c ypN0; ER 90%, PR 20%, HER-2/neu not overexpressed.  MASTECTOMY WITH SENTINEL LYMPH NODE BIOPSY;  Surgeon: Robert Bellow, MD;  Location: [facility] ORS;  Service: General;  Laterality: Left;  . MINOR BREAST BIOPSY Right 09/02/2017   Radiology perform procedure, fibrocystic changes right retroareolar area.  Marland Kitchen  NASAL RECONSTRUCTION  1983  . OOPHORECTOMY    . PORTACATH PLACEMENT N/A 10/21/2017   Procedure: INSERTION PORT-A-CATH;  Surgeon: Robert Bellow, MD;  Location: [facility] ORS;  Service: General;  Laterality: N/A;  . REMOVAL OF TISSUE EXPANDER AND PLACEMENT OF IMPLANT Left 05/13/2018   Procedure: REMOVAL OF TISSUE EXPANDER AND PLACEMENT OF IMPLANT;  Surgeon: Wallace Going, DO;  Location: [facility] ORS;  Service: Plastics;  Laterality: Left;  total surgery time  should be 3 hours, per provider  . SAVORY DILATION N/A 01/12/2015   Procedure: SAVORY DILATION;  Surgeon: Manya Silvas, MD;  Location: [facility] ENDOSCOPY;  Service: Endoscopy;  Laterality: N/A;  . TONSILLECTOMY AND ADENOIDECTOMY  1956    Family History  Problem Relation Age of Onset  . Breast cancer Sister 75       currently 90  . Breast cancer Paternal Grandmother 70       bilateral; deceased 62s   . Breast cancer Cousin 4       daughter of an unaffected maternal aunt  . Breast cancer Cousin 64       kidney cancer also; daughter of an unaffected maternal aunt  . Other Mother        TAH/BSO prior to menopause  . Prostate cancer Paternal Grandfather        age at dx unknown  . Breast cancer Maternal Aunt        age at dx unknown  . Breast cancer Cousin 27       daughter of an unaffected maternal aunt    Social History:  reports that she has never smoked. She has never used smokeless tobacco. She reports that she does not drink alcohol or use drugs.  Allergies:  Allergies  Allergen Reactions  . Penicillin G Shortness Of Breath, Swelling, Rash and Other (See Comments)    Has patient had a PCN reaction causing immediate rash, facial/tongue/throat swelling, SOB or lightheadedness with hypotension: Yes Has patient had a PCN reaction causing severe rash involving mucus membranes or skin necrosis: No Has patient had a PCN reaction that required hospitalization: No Has patient had a PCN reaction occurring within the last 10 years: No If all of the above answers are "NO", then may proceed with Cephalosporin use.  [name] [Docetaxel] Shortness Of Breath, Palpitations and Other (See Comments)    Back pain, Back Spasms  . Other Other (See Comments)    Dodecyl gallate: Positive patch test  Elta MD UV Clear SPF 46: Positive patch test  fragrance dodocylgallate dodocylgallate  . Parabens Other (See Comments)    Positive patch test   . Shellac Other (See Comments)    Positive patch  test   . Fluocinonide Rash  . Latex Rash  . Tape Dermatitis  . Triamcinolone Rash    Medications reviewed.    ROS Full ROS performed and is otherwise negative other than what is stated in HPI   BP 124/70   Pulse 72   Temp 97.7 F (36.5 C) (Skin)   Ht 5' 6"  (1.676 m)   Wt 161 lb (73 kg)   SpO2 98%   BMI 25.99 kg/m   Physical Exam Vitals signs and nursing note reviewed. Exam conducted with a chaperone present.  Constitutional:      General: She is not in acute distress.    Appearance: Normal appearance. She is normal weight.  Eyes:     General:        Right eye: No discharge.  Left eye: No discharge.  Neck:     Musculoskeletal: Normal range of motion and neck supple. No neck rigidity or muscular tenderness.  Cardiovascular:     Rate and Rhythm: Normal rate and regular rhythm.     Heart sounds: No murmur.  Pulmonary:     Effort: Pulmonary effort is normal. No respiratory distress.     Breath sounds: Normal breath sounds. No stridor.     Comments: BREAST; there is evidence of a previous mastectomy on the left side with immediate reconstruction.  There is no evidence of any infection related to the implant or any complications.  There is no lymphedema on the left side.  The right side she reports and she feels a nodule approximately a 5:00 superficial measuring about 5 mm.  On my physical exam there is some thickening on the area that seems to be within the subcutaneous tissue.  There is no massive mass Abdominal:     General: Abdomen is flat. There is no distension.     Palpations: There is no mass.     Tenderness: There is no abdominal tenderness. There is no guarding or rebound.     Hernia: No hernia is present.  Musculoskeletal: Normal range of motion.  Skin:    General: Skin is warm and dry.  Neurological:     General: No focal deficit present.     Mental Status: She is alert and oriented to person, place, and time.  Psychiatric:        Mood and Affect:  Mood normal.        Behavior: Behavior normal.        Thought Content: Thought content normal.        Judgment: Judgment normal.         Assessment/Plan: 68 year old with a history of left breast cancer status post mastectomy for invasive carcinoma.  Now with questionable right sided breast nodule.  Given the discrepancy in imaging and physical finding I had a lengthy discussion with the patient.  The next step will be to obtain an MRI.  I discussed with the patient in detail about my thought process and she is appreciative of my assessment.  Given uncertainty and mismatch in imaging and physical findings I do think that the next step would be to obtain the MRI.  I will see her back once the study is completed   Greater than 50% of the 40 minutes  visit was spent in counseling/coordination of care   Caroleen Hamman, MD [place] Surgeon

## 2018-11-01 ENCOUNTER — Ambulatory Visit: Payer: Medicare Other | Admitting: Surgery

## 2018-11-09 ENCOUNTER — Ambulatory Visit
Admission: RE | Admit: 2018-11-09 | Discharge: 2018-11-09 | Disposition: A | Discharge status: Home / Self Care | Payer: Medicare Other | Source: Ambulatory Visit | Attending: Surgery | Admitting: Surgery

## 2018-11-09 ENCOUNTER — Telehealth: Payer: Self-pay

## 2018-11-09 ENCOUNTER — Other Ambulatory Visit: Payer: Self-pay

## 2018-11-09 DIAGNOSIS — C50912 Malignant neoplasm of unspecified site of left female breast: Secondary | ICD-10-CM | POA: Insufficient documentation

## 2018-11-09 DIAGNOSIS — N6310 Unspecified lump in the right breast, unspecified quadrant: Secondary | ICD-10-CM | POA: Diagnosis present

## 2018-11-09 MED ORDER — GADOBUTROL 1 MMOL/ML IV SOLN
10.0000 mL | Freq: Once | INTRAVENOUS | Status: AC | PRN
  Administered 2018-11-09: 11:00:00 7.5 mL via INTRAVENOUS

## 2018-11-09 NOTE — Telephone Encounter (Signed)
Patient notified per Dr Dahlia Byes that the Breast MRI did not show any concerning lesions. She will follow up as scheduled.

## 2018-11-22 ENCOUNTER — Ambulatory Visit: Payer: Medicare Other | Admitting: Surgery

## 2018-11-24 ENCOUNTER — Other Ambulatory Visit: Payer: Self-pay

## 2018-11-24 ENCOUNTER — Encounter: Payer: Self-pay | Admitting: Surgery

## 2018-11-24 ENCOUNTER — Ambulatory Visit (INDEPENDENT_AMBULATORY_CARE_PROVIDER_SITE_OTHER): Payer: Medicare Other | Admitting: Surgery

## 2018-11-24 VITALS — BP 128/72 | HR 74 | Temp 97.8°F | Ht 66.0 in | Wt 163.0 lb

## 2018-11-24 DIAGNOSIS — C50912 Malignant neoplasm of unspecified site of left female breast: Secondary | ICD-10-CM | POA: Diagnosis not present

## 2018-11-24 NOTE — Progress Notes (Signed)
Outpatient Surgical Follow Up  11/24/2018  Mariah Hogan is an 68 y.o. female.   Chief Complaint  Patient presents with  . Follow-up    MRI    HPI: 68 year old female status post left mastectomy for invasive breast cancer er pr + hER2 NEGATIVE 01/2018.  and reconstruction.  SHE IS FOLLOWING UP AFTER mri.  I have personally reviewed the imaging studies including a mammogram. mri and ultrasound and this has failed to reveal any discrete lesions.  I actually share the images with the patient and explained to them in detail.  Of note I do see the clip on the mammogram but I do not see a discrete clip on the MRI.  There is no evidence of fevers chills or any other complications related to her implant.  Past Medical History:  Diagnosis Date  . Anemia   . Arthritis   . Asthma    H/O YEARS AGO-NOW HAS COUGHING SPELLS WHICH IS WHY SHE HAS HER ALBUTEROL INHALER  . BRCA gene mutation negative 08/12/2017   Variant of unknown significance at [facility] only abnormality.  . Breast cancer ([place]) 08/04/2017   1.8 cm ER 90%, PR 20%, HER-2/neu negative invasive mammary carcinoma of the left upper outer quadrant.  MammaPrint: High risk.  . Cancer ([place])    [phone] basal cell carcinoma  . Complication of anesthesia    HARD TIME WAKING UP  . Dysrhythmia    TACHYCARDIA  . GERD (gastroesophageal reflux disease)   . History of hiatal hernia    SMALL  . Hyperlipidemia   . Hypertension    PT STATES IN 01-18-18 PREOP PHONE INTERVIEW THAT HER BP HAS BEEN ELEVATED SINCE STARTING ON VALSARTAN- PCP SWITCHED PT BACK TO MAXIDE ON 01-18-18 AND PT STATES THAT SHE HAS LOST 6-10 POUNDS OF FLUID SINCE RESTARTING MAXIDE.    Marland Kitchen Personal history of chemotherapy   . PONV (postoperative nausea and vomiting)     Past Surgical History:  Procedure Laterality Date  . ABDOMINAL HYSTERECTOMY  1998  . bladder tack  [id number]  . BREAST BIOPSY Left 1990  . BREAST BIOPSY Left 2018   core bx- neg  . BREAST BIOPSY Left  08/04/2017   left UOQ 2 oclock INVASIVE DUCTAL CARCINOMA.  Marland Kitchen BREAST BIOPSY Right 08/2017   MRI biopsy, FIBROCYSTIC CHANGE AND USUAL DUCTAL HYPERPLASIA,, clip placed  . BREAST CYST EXCISION  x3  . BREAST EXCISIONAL BIOPSY Bilateral   . BREAST RECONSTRUCTION WITH PLACEMENT OF TISSUE EXPANDER AND FLEX HD (ACELLULAR HYDRATED DERMIS) Left 01/27/2018   Procedure: BREAST RECONSTRUCTION WITH PLACEMENT OF TISSUE EXPANDER AND FLEX HD (ACELLULAR HYDRATED DERMIS);  Surgeon: Wallace Going, DO;  Location: [facility] ORS;  Service: Plastics;  Laterality: Left;  . BREAST SURGERY Left 02/13/14   Fibrocystic changes, pseudo-angiomatous stromal hyperplasia. No atypia or malignancy.  . CARPAL TUNNEL RELEASE  x2  . CHOLECYSTECTOMY  2008  . COLONOSCOPY WITH PROPOFOL N/A 01/12/2015   Procedure: COLONOSCOPY WITH PROPOFOL;  Surgeon: Manya Silvas, MD;  Location: [facility] ENDOSCOPY;  Service: Endoscopy;  Laterality: N/A;  . ESOPHAGOGASTRODUODENOSCOPY (EGD) WITH PROPOFOL N/A 01/12/2015   Procedure: ESOPHAGOGASTRODUODENOSCOPY (EGD) WITH PROPOFOL;  Surgeon: Manya Silvas, MD;  Location: [facility] ENDOSCOPY;  Service: Endoscopy;  Laterality: N/A;  . MASTECTOMY Left 2019   [place]  . MASTECTOMY W/ SENTINEL NODE BIOPSY Left 01/27/2018   ypT1c ypN0; ER 90%, PR 20%, HER-2/neu not overexpressed.  MASTECTOMY WITH SENTINEL LYMPH NODE BIOPSY;  Surgeon: Robert Bellow, MD;  Location: [facility] ORS;  Service: General;  Laterality: Left;  . MINOR BREAST BIOPSY Right 09/02/2017   Radiology perform procedure, fibrocystic changes right retroareolar area.  Marland Kitchen NASAL RECONSTRUCTION  1983  . OOPHORECTOMY    . PORTACATH PLACEMENT N/A 10/21/2017   Procedure: INSERTION PORT-A-CATH;  Surgeon: Robert Bellow, MD;  Location: [facility] ORS;  Service: General;  Laterality: N/A;  . REMOVAL OF TISSUE EXPANDER AND PLACEMENT OF IMPLANT Left 05/13/2018   Procedure: REMOVAL OF TISSUE EXPANDER AND PLACEMENT OF IMPLANT;  Surgeon: Wallace Going, DO;  Location:  [facility] ORS;  Service: Plastics;  Laterality: Left;  total surgery time should be 3 hours, per provider  . SAVORY DILATION N/A 01/12/2015   Procedure: SAVORY DILATION;  Surgeon: Manya Silvas, MD;  Location: [facility] ENDOSCOPY;  Service: Endoscopy;  Laterality: N/A;  . TONSILLECTOMY AND ADENOIDECTOMY  1956    Family History  Problem Relation Age of Onset  . Breast cancer Sister 36       currently 48  . Breast cancer Paternal Grandmother 46       bilateral; deceased 36s   . Breast cancer Cousin 70       daughter of an unaffected maternal aunt  . Breast cancer Cousin 58       kidney cancer also; daughter of an unaffected maternal aunt  . Other Mother        TAH/BSO prior to menopause  . Prostate cancer Paternal Grandfather        age at dx unknown  . Breast cancer Maternal Aunt        age at dx unknown  . Breast cancer Cousin 29       daughter of an unaffected maternal aunt    Social History:  reports that she has never smoked. She has never used smokeless tobacco. She reports that she does not drink alcohol or use drugs.  Allergies:  Allergies  Allergen Reactions  . Penicillin G Shortness Of Breath, Swelling, Rash and Other (See Comments)    Has patient had a PCN reaction causing immediate rash, facial/tongue/throat swelling, SOB or lightheadedness with hypotension: Yes Has patient had a PCN reaction causing severe rash involving mucus membranes or skin necrosis: No Has patient had a PCN reaction that required hospitalization: No Has patient had a PCN reaction occurring within the last 10 years: No If all of the above answers are "NO", then may proceed with Cephalosporin use.  [name] [Docetaxel] Shortness Of Breath, Palpitations and Other (See Comments)    Back pain, Back Spasms  . Other Other (See Comments)    Dodecyl gallate: Positive patch test  Elta MD UV Clear SPF 46: Positive patch test  fragrance dodocylgallate dodocylgallate  . Parabens Other (See Comments)     Positive patch test   . Shellac Other (See Comments)    Positive patch test   . Fluocinonide Rash  . Latex Rash  . Tape Dermatitis  . Triamcinolone Rash    Medications reviewed.    ROS Full ROS performed and is otherwise negative other than what is stated in HPI   BP 128/72   Pulse 74   Temp 97.8 F (36.6 C) (Skin)   Ht 5' 6"  (1.676 m)   Wt 163 lb (73.9 kg)   SpO2 98%   BMI 26.31 kg/m   Physical Exam Vitals signs and nursing note reviewed.  Constitutional:      Appearance: Normal appearance.  Pulmonary:     Comments: BREAST:  PT wishes not to have  a breast exam. Neurological:     General: No focal deficit present.     Mental Status: She is alert and oriented to person, place, and time.  Psychiatric:        Mood and Affect: Mood normal.        Behavior: Behavior normal.        Thought Content: Thought content normal.        Judgment: Judgment normal.     Assessment/Plan:  68 year old female status post left mastectomy for invasive breast cancer with no evidence of recurrence.  I will see her back in 1 year with mammogram and physical exam. Greater than 50% of the 25 minutes  visit was spent in counseling/coordination of care   Caroleen Hamman, MD [place] Surgeon

## 2018-11-24 NOTE — Patient Instructions (Signed)
The patient has been asked to return to the office in one year with a bilateral diagnostic mammogram.

## 2018-12-02 ENCOUNTER — Telehealth: Payer: Self-pay

## 2018-12-02 NOTE — Telephone Encounter (Signed)

## 2018-12-03 ENCOUNTER — Other Ambulatory Visit: Payer: Self-pay

## 2018-12-03 ENCOUNTER — Ambulatory Visit (INDEPENDENT_AMBULATORY_CARE_PROVIDER_SITE_OTHER): Payer: Medicare Other | Admitting: Plastic Surgery

## 2018-12-03 ENCOUNTER — Encounter: Payer: Self-pay | Admitting: Plastic Surgery

## 2018-12-03 VITALS — BP 134/82 | HR 74 | Temp 97.3°F | Ht 66.0 in | Wt 164.2 lb

## 2018-12-03 DIAGNOSIS — N651 Disproportion of reconstructed breast: Secondary | ICD-10-CM

## 2018-12-03 DIAGNOSIS — Z719 Counseling, unspecified: Secondary | ICD-10-CM

## 2018-12-03 MED ORDER — HYDROCODONE-ACETAMINOPHEN 5-325 MG PO TABS: 1.0000 | ORAL_TABLET | Freq: Two times a day (BID) | ORAL | 0 refills | Status: AC | PRN

## 2018-12-03 MED ORDER — DIAZEPAM 2 MG PO TABS: 2.0000 mg | ORAL_TABLET | Freq: Four times a day (QID) | ORAL | 0 refills | Status: DC | PRN

## 2018-12-03 MED ORDER — CIPROFLOXACIN HCL 500 MG PO TABS: 500.0000 mg | ORAL_TABLET | Freq: Two times a day (BID) | ORAL | 0 refills | Status: AC

## 2018-12-03 MED ORDER — ONDANSETRON HCL 4 MG PO TABS: 4.0000 mg | ORAL_TABLET | Freq: Three times a day (TID) | ORAL | 0 refills | Status: AC | PRN

## 2018-12-03 NOTE — H&P (View-Only) (Signed)
Patient ID: Mariah Hogan, female    DOB: 23-Jul-1950, 68 y.o.   MRN: [id number]   Chief Complaint  Patient presents with  . Pre-op Exam    for (L) breast reconstruction w/ fat filling    The patient is a 68 year old white female here for her preoperative history and physical for continued breast reconstruction.  She has done extremely well with her previous surgeries.  She now has some asymmetry with loss of volume of the upper pole.  The implant has some movement but again it does appear to be good movement.  The incisions are nicely healed there is no sign of hematoma, seroma or infection.   Review of Systems  Constitutional: Negative for activity change and appetite change.  HENT: Negative.   Eyes: Negative.   Respiratory: Negative.   Cardiovascular: Negative.   Gastrointestinal: Negative.   Endocrine: Negative.   Genitourinary: Negative.   Musculoskeletal: Negative.   Skin: Negative.  Negative for color change and wound.  Psychiatric/Behavioral: Negative.     Past Medical History:  Diagnosis Date  . Anemia   . Arthritis   . Asthma    H/O YEARS AGO-NOW HAS COUGHING SPELLS WHICH IS WHY SHE HAS HER ALBUTEROL INHALER  . BRCA gene mutation negative 08/12/2017   Variant of unknown significance at [facility] only abnormality.  . 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  MammaPrint: High risk.  . Cancer ([place])    [phone] basal cell carcinoma  . Complication of anesthesia    HARD TIME WAKING UP  . Dysrhythmia    TACHYCARDIA  . GERD (gastroesophageal reflux disease)   . History of hiatal hernia    SMALL  . Hyperlipidemia   . Hypertension    PT STATES IN 01-18-18 PREOP PHONE INTERVIEW THAT HER BP HAS BEEN ELEVATED SINCE STARTING ON VALSARTAN- PCP SWITCHED PT BACK TO MAXIDE ON 01-18-18 AND PT STATES THAT SHE HAS LOST 6-10 POUNDS OF FLUID SINCE RESTARTING MAXIDE.    Marland Kitchen Personal  history of chemotherapy   . PONV (postoperative nausea and vomiting)     Past Surgical History:  Procedure Laterality Date  . ABDOMINAL HYSTERECTOMY  1998  . bladder tack  [id number]  . BREAST BIOPSY Left 1990  . BREAST BIOPSY Left 2018   core bx- neg  . 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  Marland Kitchen BREAST BIOPSY Right 08/2017   MRI biopsy, FIBROCYSTIC CHANGE AND USUAL DUCTAL HYPERPLASIA,, clip placed  . BREAST CYST EXCISION  x3  . BREAST EXCISIONAL BIOPSY Bilateral   . BREAST RECONSTRUCTION WITH PLACEMENT OF TISSUE EXPANDER AND FLEX HD (ACELLULAR HYDRATED DERMIS) Left 01/27/2018   Procedure: BREAST RECONSTRUCTION WITH PLACEMENT OF TISSUE EXPANDER AND FLEX HD (ACELLULAR HYDRATED DERMIS);  Surgeon: Wallace Going, DO;  Location: [facility] ORS;  Service: Plastics;  Laterality: Left;  . BREAST SURGERY Left 02/13/14   Fibrocystic changes, pseudo-angiomatous stromal hyperplasia. No atypia or malignancy.  . CARPAL TUNNEL RELEASE  x2  . CHOLECYSTECTOMY  2008  . COLONOSCOPY WITH PROPOFOL N/A 01/12/2015   Procedure: COLONOSCOPY WITH PROPOFOL;  Surgeon: Manya Silvas, MD;  [facility] ENDOSCOPY;  Service: Endoscopy;  Laterality: N/A;  . ESOPHAGOGASTRODUODENOSCOPY (EGD) WITH PROPOFOL N/A 01/12/2015   Procedure: ESOPHAGOGASTRODUODENOSCOPY (EGD) WITH PROPOFOL;  Surgeon: Manya Silvas, MD;  Location: [facility] ENDOSCOPY;  Service: Endoscopy;  Laterality: N/A;  .  MASTECTOMY Left 2019   [place]  . MASTECTOMY W/ SENTINEL NODE BIOPSY Left 01/27/2018   ypT1c ypN0; ER 90%, PR 20%, HER-2/neu not overexpressed.  MASTECTOMY WITH SENTINEL LYMPH NODE BIOPSY;  Surgeon: Robert Bellow, MD;  Location: [facility] ORS;  Service: General;  Laterality: Left;  . MINOR BREAST BIOPSY Right 09/02/2017   Radiology perform procedure, fibrocystic changes right retroareolar area.  Marland Kitchen NASAL RECONSTRUCTION  1983  . OOPHORECTOMY    . PORTACATH PLACEMENT N/A 10/21/2017   Procedure: INSERTION  PORT-A-CATH;  Surgeon: Robert Bellow, MD;  Location: [facility] ORS;  Service: General;  Laterality: N/A;  . REMOVAL OF TISSUE EXPANDER AND PLACEMENT OF IMPLANT Left 05/13/2018   Procedure: REMOVAL OF TISSUE EXPANDER AND PLACEMENT OF IMPLANT;  Surgeon: Wallace Going, DO;  Location: [facility] ORS;  Service: Plastics;  Laterality: Left;  total surgery time should be 3 hours, per provider  . SAVORY DILATION N/A 01/12/2015   Procedure: SAVORY DILATION;  Surgeon: Manya Silvas, MD;  Location: [facility] ENDOSCOPY;  Service: Endoscopy;  Laterality: N/A;  . TONSILLECTOMY AND ADENOIDECTOMY  1956      Current Outpatient Medications:  .  albuterol (PROVENTIL HFA;VENTOLIN HFA) 108 (90 Base) MCG/ACT inhaler, Inhale 2 puffs into the lungs every 6 (six) hours as needed for wheezing or shortness of breath., Disp: 1 Inhaler, Rfl: 1 .  atenolol (TENORMIN) 50 MG tablet, Take 50 mg by mouth every morning. , Disp: , Rfl:  .  cetirizine (ZYRTEC) 10 MG tablet, Take 10 mg by mouth daily before lunch. , Disp: , Rfl:  .  citalopram (CELEXA) 20 MG tablet, Take 20 mg by mouth at bedtime. , Disp: , Rfl:  .  Desoximetasone (TOPICORT) 0.25 % ointment, , Disp: , Rfl:  .  letrozole (FEMARA) 2.5 MG tablet, Take 1 tablet (2.5 mg total) by mouth daily. Future refill will be AFTER MD visit and lab check., Disp: 90 tablet, Rfl: 3 .  pantoprazole (PROTONIX) 40 MG tablet, Take 40 mg by mouth 2 (two) times daily as needed (heartburn). , Disp: , Rfl:  .  PREMARIN vaginal cream, , Disp: , Rfl:  .  triamterene-hydrochlorothiazide (MAXZIDE-25) 37.5-25 MG tablet, Take 1 tablet by mouth every morning. 1/2 tab, Disp: , Rfl:  .  valACYclovir (VALTREX) 1000 MG tablet, Take 1,000 mg by mouth See admin instructions. Take 1000 mg in the morning , may take a second 1000 mg dose as needed for fever blisters , Disp: , Rfl:  .  ciprofloxacin (CIPRO) 500 MG tablet, Take 1 tablet (500 mg total) by mouth 2 (two) times daily for 3 days., Disp: 6 tablet, Rfl: 0  .  diazepam (VALIUM) 2 MG tablet, Take 1 tablet (2 mg total) by mouth every 6 (six) hours as needed for anxiety., Disp: 30 tablet, Rfl: 0 .  HYDROcodone-acetaminophen (NORCO) 5-325 MG tablet, Take 1 tablet by mouth every 12 (twelve) hours as needed for up to 7 days for moderate pain., Disp: 14 tablet, Rfl: 0 .  ondansetron (ZOFRAN) 4 MG tablet, Take 1 tablet (4 mg total) by mouth every 8 (eight) hours as needed for up to 5 days., Disp: 15 tablet, Rfl: 0   Objective:   Vitals:   12/03/18 1603  BP: 134/82  Pulse: 74  Temp: (!) 97.3 F (36.3 C)  SpO2: 96%    Physical Exam Vitals signs and nursing note reviewed.  Constitutional:      Appearance: Normal appearance.  Cardiovascular:     Rate and Rhythm: Normal  rate.     Pulses: Normal pulses.  Pulmonary:     Effort: Pulmonary effort is normal.  Abdominal:     General: Abdomen is flat. There is no distension.     Tenderness: There is no abdominal tenderness.  Neurological:     General: No focal deficit present.     Mental Status: She is alert. Mental status is at baseline.  Psychiatric:        Mood and Affect: Mood normal.        Thought Content: Thought content normal.        Judgment: Judgment normal.     Assessment & Plan:  Breast asymmetry following reconstructive surgery Plan for Lipo filling of left breast for improved symmetry.  We will plan to get this from the abdomen.  [place], DO

## 2018-12-03 NOTE — Progress Notes (Signed)
Patient ID: Mariah Hogan, female    DOB: 23-Jul-1950, 68 y.o.   MRN: [id number]   Chief Complaint  Patient presents with  . Pre-op Exam    for (L) breast reconstruction w/ fat filling    The patient is a 68 year old white female here for her preoperative history and physical for continued breast reconstruction.  She has done extremely well with her previous surgeries.  She now has some asymmetry with loss of volume of the upper pole.  The implant has some movement but again it does appear to be good movement.  The incisions are nicely healed there is no sign of hematoma, seroma or infection.   Review of Systems  Constitutional: Negative for activity change and appetite change.  HENT: Negative.   Eyes: Negative.   Respiratory: Negative.   Cardiovascular: Negative.   Gastrointestinal: Negative.   Endocrine: Negative.   Genitourinary: Negative.   Musculoskeletal: Negative.   Skin: Negative.  Negative for color change and wound.  Psychiatric/Behavioral: Negative.     Past Medical History:  Diagnosis Date  . Anemia   . Arthritis   . Asthma    H/O YEARS AGO-NOW HAS COUGHING SPELLS WHICH IS WHY SHE HAS HER ALBUTEROL INHALER  . BRCA gene mutation negative 08/12/2017   Variant of unknown significance at [facility] only abnormality.  . 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  MammaPrint: High risk.  . Cancer ([place])    [phone] basal cell carcinoma  . Complication of anesthesia    HARD TIME WAKING UP  . Dysrhythmia    TACHYCARDIA  . GERD (gastroesophageal reflux disease)   . History of hiatal hernia    SMALL  . Hyperlipidemia   . Hypertension    PT STATES IN 01-18-18 PREOP PHONE INTERVIEW THAT HER BP HAS BEEN ELEVATED SINCE STARTING ON VALSARTAN- PCP SWITCHED PT BACK TO MAXIDE ON 01-18-18 AND PT STATES THAT SHE HAS LOST 6-10 POUNDS OF FLUID SINCE RESTARTING MAXIDE.    Marland Kitchen Personal  history of chemotherapy   . PONV (postoperative nausea and vomiting)     Past Surgical History:  Procedure Laterality Date  . ABDOMINAL HYSTERECTOMY  1998  . bladder tack  [id number]  . BREAST BIOPSY Left 1990  . BREAST BIOPSY Left 2018   core bx- neg  . 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  Marland Kitchen BREAST BIOPSY Right 08/2017   MRI biopsy, FIBROCYSTIC CHANGE AND USUAL DUCTAL HYPERPLASIA,, clip placed  . BREAST CYST EXCISION  x3  . BREAST EXCISIONAL BIOPSY Bilateral   . BREAST RECONSTRUCTION WITH PLACEMENT OF TISSUE EXPANDER AND FLEX HD (ACELLULAR HYDRATED DERMIS) Left 01/27/2018   Procedure: BREAST RECONSTRUCTION WITH PLACEMENT OF TISSUE EXPANDER AND FLEX HD (ACELLULAR HYDRATED DERMIS);  Surgeon: Wallace Going, DO;  Location: [facility] ORS;  Service: Plastics;  Laterality: Left;  . BREAST SURGERY Left 02/13/14   Fibrocystic changes, pseudo-angiomatous stromal hyperplasia. No atypia or malignancy.  . CARPAL TUNNEL RELEASE  x2  . CHOLECYSTECTOMY  2008  . COLONOSCOPY WITH PROPOFOL N/A 01/12/2015   Procedure: COLONOSCOPY WITH PROPOFOL;  Surgeon: Manya Silvas, MD;  Location[facility];  Service: Endoscopy;  Laterality: N/A;  . ESOPHAGOGASTRODUODENOSCOPY (EGD) WITH PROPOFOL N/A 01/12/2015   Procedure: ESOPHAGOGASTRODUODENOSCOPY (EGD) WITH PROPOFOL;  Surgeon: Manya Silvas, MD;  Location: [facility] ENDOSCOPY;  Service: Endoscopy;  Laterality: N/A;  .  MASTECTOMY Left 2019   [place]  . MASTECTOMY W/ SENTINEL NODE BIOPSY Left 01/27/2018   ypT1c ypN0; ER 90%, PR 20%, HER-2/neu not overexpressed.  MASTECTOMY WITH SENTINEL LYMPH NODE BIOPSY;  Surgeon: Robert Bellow, MD;  Location: [facility] ORS;  Service: General;  Laterality: Left;  . MINOR BREAST BIOPSY Right 09/02/2017   Radiology perform procedure, fibrocystic changes right retroareolar area.  Marland Kitchen NASAL RECONSTRUCTION  1983  . OOPHORECTOMY    . PORTACATH PLACEMENT N/A 10/21/2017   Procedure: INSERTION  PORT-A-CATH;  Surgeon: Robert Bellow, MD;  Location: [facility] ORS;  Service: General;  Laterality: N/A;  . REMOVAL OF TISSUE EXPANDER AND PLACEMENT OF IMPLANT Left 05/13/2018   Procedure: REMOVAL OF TISSUE EXPANDER AND PLACEMENT OF IMPLANT;  Surgeon: Wallace Going, DO;  Location: [facility] ORS;  Service: Plastics;  Laterality: Left;  total surgery time should be 3 hours, per provider  . SAVORY DILATION N/A 01/12/2015   Procedure: SAVORY DILATION;  Surgeon: Manya Silvas, MD;  Location: [facility] ENDOSCOPY;  Service: Endoscopy;  Laterality: N/A;  . TONSILLECTOMY AND ADENOIDECTOMY  1956      Current Outpatient Medications:  .  albuterol (PROVENTIL HFA;VENTOLIN HFA) 108 (90 Base) MCG/ACT inhaler, Inhale 2 puffs into the lungs every 6 (six) hours as needed for wheezing or shortness of breath., Disp: 1 Inhaler, Rfl: 1 .  atenolol (TENORMIN) 50 MG tablet, Take 50 mg by mouth every morning. , Disp: , Rfl:  .  cetirizine (ZYRTEC) 10 MG tablet, Take 10 mg by mouth daily before lunch. , Disp: , Rfl:  .  citalopram (CELEXA) 20 MG tablet, Take 20 mg by mouth at bedtime. , Disp: , Rfl:  .  Desoximetasone (TOPICORT) 0.25 % ointment, , Disp: , Rfl:  .  letrozole (FEMARA) 2.5 MG tablet, Take 1 tablet (2.5 mg total) by mouth daily. Future refill will be AFTER MD visit and lab check., Disp: 90 tablet, Rfl: 3 .  pantoprazole (PROTONIX) 40 MG tablet, Take 40 mg by mouth 2 (two) times daily as needed (heartburn). , Disp: , Rfl:  .  PREMARIN vaginal cream, , Disp: , Rfl:  .  triamterene-hydrochlorothiazide (MAXZIDE-25) 37.5-25 MG tablet, Take 1 tablet by mouth every morning. 1/2 tab, Disp: , Rfl:  .  valACYclovir (VALTREX) 1000 MG tablet, Take 1,000 mg by mouth See admin instructions. Take 1000 mg in the morning , may take a second 1000 mg dose as needed for fever blisters , Disp: , Rfl:  .  ciprofloxacin (CIPRO) 500 MG tablet, Take 1 tablet (500 mg total) by mouth 2 (two) times daily for 3 days., Disp: 6 tablet, Rfl: 0  .  diazepam (VALIUM) 2 MG tablet, Take 1 tablet (2 mg total) by mouth every 6 (six) hours as needed for anxiety., Disp: 30 tablet, Rfl: 0 .  HYDROcodone-acetaminophen (NORCO) 5-325 MG tablet, Take 1 tablet by mouth every 12 (twelve) hours as needed for up to 7 days for moderate pain., Disp: 14 tablet, Rfl: 0 .  ondansetron (ZOFRAN) 4 MG tablet, Take 1 tablet (4 mg total) by mouth every 8 (eight) hours as needed for up to 5 days., Disp: 15 tablet, Rfl: 0   Objective:   Vitals:   12/03/18 1603  BP: 134/82  Pulse: 74  Temp: (!) 97.3 F (36.3 C)  SpO2: 96%    Physical Exam Vitals signs and nursing note reviewed.  Constitutional:      Appearance: Normal appearance.  Cardiovascular:     Rate and Rhythm: Normal  rate.     Pulses: Normal pulses.  Pulmonary:     Effort: Pulmonary effort is normal.  Abdominal:     General: Abdomen is flat. There is no distension.     Tenderness: There is no abdominal tenderness.  Neurological:     General: No focal deficit present.     Mental Status: She is alert. Mental status is at baseline.  Psychiatric:        Mood and Affect: Mood normal.        Thought Content: Thought content normal.        Judgment: Judgment normal.     Assessment & Plan:  Breast asymmetry following reconstructive surgery Plan for Lipo filling of left breast for improved symmetry.  We will plan to get this from the abdomen.  [place], DO

## 2018-12-05 ENCOUNTER — Other Ambulatory Visit: Payer: Self-pay

## 2018-12-06 ENCOUNTER — Inpatient Hospital Stay: Payer: Medicare Other

## 2018-12-06 ENCOUNTER — Inpatient Hospital Stay: Payer: Medicare Other | Attending: Hematology and Oncology | Admitting: Hematology and Oncology

## 2018-12-06 ENCOUNTER — Encounter: Payer: Self-pay | Admitting: Hematology and Oncology

## 2018-12-06 VITALS — BP 138/62 | HR 74 | Temp 98.6°F | Resp 16 | Wt 163.8 lb

## 2018-12-06 DIAGNOSIS — M8580 Other specified disorders of bone density and structure, unspecified site: Secondary | ICD-10-CM | POA: Insufficient documentation

## 2018-12-06 DIAGNOSIS — M85852 Other specified disorders of bone density and structure, left thigh: Secondary | ICD-10-CM | POA: Diagnosis not present

## 2018-12-06 DIAGNOSIS — Z170 Estrogen receptor positive status [ER+]: Secondary | ICD-10-CM | POA: Diagnosis not present

## 2018-12-06 DIAGNOSIS — C50912 Malignant neoplasm of unspecified site of left female breast: Secondary | ICD-10-CM | POA: Diagnosis not present

## 2018-12-06 DIAGNOSIS — Z79811 Long term (current) use of aromatase inhibitors: Secondary | ICD-10-CM | POA: Insufficient documentation

## 2018-12-06 LAB — COMPREHENSIVE METABOLIC PANEL
ALT: 17 U/L (ref 0–44)
AST: 16 U/L (ref 15–41)
Albumin: 3.6 g/dL (ref 3.5–5.0)
Alkaline Phosphatase: 96 U/L (ref 38–126)
Anion gap: 8 (ref 5–15)
BUN: 18 mg/dL (ref 8–23)
CO2: 25 mmol/L (ref 22–32)
Calcium: 8.7 mg/dL — ABNORMAL LOW (ref 8.9–10.3)
Chloride: 103 mmol/L (ref 98–111)
Creatinine, Ser: 1.08 mg/dL — ABNORMAL HIGH (ref 0.44–1.00)
GFR calc Af Amer: >60 mL/min (ref >60)
GFR calc non Af Amer: 52 mL/min — ABNORMAL LOW (ref >60)
Glucose, Bld: 115 mg/dL — ABNORMAL HIGH (ref 70–99)
Potassium: 3.7 mmol/L (ref 3.5–5.1)
Sodium: 136 mmol/L (ref 135–145)
Total Bilirubin: 0.6 mg/dL (ref 0.3–1.2)
Total Protein: 6.5 g/dL (ref 6.5–8.1)

## 2018-12-06 LAB — CBC WITH DIFFERENTIAL/PLATELET
Abs Immature Granulocytes: 0.03 10*3/uL (ref 0.00–0.07)
Basophils Absolute: 0 10*3/uL (ref 0.0–0.1)
Basophils Relative: 0 %
Eosinophils Absolute: 0.2 10*3/uL (ref 0.0–0.5)
Eosinophils Relative: 4 %
HCT: 38.7 % (ref 36.0–46.0)
Hemoglobin: 13.5 g/dL (ref 12.0–15.0)
Immature Granulocytes: 1 %
Lymphocytes Relative: 22 %
Lymphs Abs: 1.1 10*3/uL (ref 0.7–4.0)
MCH: 31.5 pg (ref 26.0–34.0)
MCHC: 34.9 g/dL (ref 30.0–36.0)
MCV: 90.2 fL (ref 80.0–100.0)
Monocytes Absolute: 0.3 10*3/uL (ref 0.1–1.0)
Monocytes Relative: 7 %
Neutro Abs: 3.3 10*3/uL (ref 1.7–7.7)
Neutrophils Relative %: 66 %
Platelets: 184 10*3/uL (ref 150–400)
RBC: 4.29 MIL/uL (ref 3.87–5.11)
RDW: 11.9 % (ref 11.5–15.5)
WBC: 5 10*3/uL (ref 4.0–10.5)
nRBC: 0 % (ref 0.0–0.2)

## 2018-12-06 NOTE — Progress Notes (Signed)
Patient here for follow up. Reports she is fatigued and has no energy since receiving chemo. Denies any other concerns.

## 2018-12-06 NOTE — Progress Notes (Signed)
[facility]  [address] [place], [place] [address] Phone: [phone]  Fax: [phone]   Clinic Day:  12/06/2018  Referring physician: Marinda Elk, MD  Chief Complaint: Mariah Hogan is a 68 y.o. female with clinical stage IA left breast cancer on Femara who is seen for 3 month assessment   HPI: The patient was last seen in the medical oncology clinic on 08/31/2018. At that time, she was fatigued. She had occasional nausea and diarrhea.  She had shortness of breath with walking.  She denied any breast concerns.  Diagnostic right mammogram and ultrasound of the right breast on 10/18/2018 revealed no suspicious mass, malignant type microcalcifications or distortion detected in the right breast. Spot tangential view of the area of clinical concern in the right breast shows normal fibroglandular tissue. On physical exam, a discrete mass in the area of clinical concern at 3:30 6 cm from the nipple was not palpated.  Targeted ultrasound was performed, showing normal tissue in the of clinical concern at 3:30 6 cm from the nipple. There was no solid or cystic mass, abnormal shadowing or distortion visualized.  Bilateral breast MRI on 11/09/2018 revealed left mastectomy with implant reconstruction.  There was no suspicious enhancement.  Right breast was negative. Specifically, in the area of concern, the medial aspect of the RIGHT breast is unremarkable.   She met with Dr. Marla Roe on 12/03/2018. She is scheduled for surgery on 12/23/2018 to remove some adipose from her abdomen and move it to her breast.  During the interim, she feels ok, but she has some fatigue. She said she has not regained her energy post cancer treatment.  She has no prohibitive toxicities from continuing letrozole at this time.    Past Medical History:  Diagnosis Date   Anemia    Arthritis    Asthma    H/O YEARS AGO-NOW HAS COUGHING SPELLS WHICH IS WHY SHE HAS HER  ALBUTEROL INHALER   BRCA gene mutation negative 08/12/2017   Variant of unknown significance at [facility] only abnormality.   Breast cancer ([place]) 08/04/2017   1.8 cm ER 90%, PR 20%, HER-2/neu negative invasive mammary carcinoma of the left upper outer quadrant.  MammaPrint: High risk.   Cancer ([place])    [phone] basal cell carcinoma   Complication of anesthesia    HARD TIME WAKING UP   Dysrhythmia    TACHYCARDIA   GERD (gastroesophageal reflux disease)    History of hiatal hernia    SMALL   Hyperlipidemia    Hypertension    PT STATES IN 01-18-18 PREOP PHONE INTERVIEW THAT HER BP HAS BEEN ELEVATED SINCE STARTING ON VALSARTAN- PCP SWITCHED PT BACK TO [place] ON 01-18-18 AND PT STATES THAT SHE HAS LOST 6-10 POUNDS OF FLUID SINCE RESTARTING MAXIDE.     Personal history of chemotherapy    PONV (postoperative nausea and vomiting)     Past Surgical History:  Procedure Laterality Date   ABDOMINAL HYSTERECTOMY  1998   bladder tack  2002,2008   BREAST BIOPSY Left 1990   BREAST BIOPSY Left 2018   core bx- neg   BREAST BIOPSY Left 08/04/2017   left UOQ 2 oclock INVASIVE DUCTAL CARCINOMA.   BREAST BIOPSY Right 08/2017   MRI biopsy, FIBROCYSTIC CHANGE AND USUAL DUCTAL HYPERPLASIA,, clip placed   BREAST CYST EXCISION  x3   BREAST EXCISIONAL BIOPSY Bilateral    BREAST RECONSTRUCTION WITH PLACEMENT OF TISSUE EXPANDER AND FLEX HD (ACELLULAR HYDRATED DERMIS) Left 01/27/2018  Procedure: BREAST RECONSTRUCTION WITH PLACEMENT OF TISSUE EXPANDER AND FLEX HD (ACELLULAR HYDRATED DERMIS);  Surgeon: Wallace Going, DO;  Location: [facility] ORS;  Service: Plastics;  Laterality: Left;   BREAST SURGERY Left 02/13/14   Fibrocystic changes, pseudo-angiomatous stromal hyperplasia. No atypia or malignancy.   CARPAL TUNNEL RELEASE  x2   CHOLECYSTECTOMY  2008   COLONOSCOPY WITH PROPOFOL N/A 01/12/2015   Procedure: COLONOSCOPY WITH PROPOFOL;  Surgeon: Manya Silvas, MD;  Location: [facility]  ENDOSCOPY;  Service: Endoscopy;  Laterality: N/A;   ESOPHAGOGASTRODUODENOSCOPY (EGD) WITH PROPOFOL N/A 01/12/2015   Procedure: ESOPHAGOGASTRODUODENOSCOPY (EGD) WITH PROPOFOL;  Surgeon: Manya Silvas, MD;  Location: [facility] ENDOSCOPY;  Service: Endoscopy;  Laterality: N/A;   MASTECTOMY Left 2019   [place]   MASTECTOMY W/ SENTINEL NODE BIOPSY Left 01/27/2018   ypT1c ypN0; ER 90%, PR 20%, HER-2/neu not overexpressed.  MASTECTOMY WITH SENTINEL LYMPH NODE BIOPSY;  Surgeon: Robert Bellow, MD;  Location: [facility] ORS;  Service: General;  Laterality: Left;   MINOR BREAST BIOPSY Right 09/02/2017   Radiology perform procedure, fibrocystic changes right retroareolar area.   NASAL RECONSTRUCTION  1983   OOPHORECTOMY     PORTACATH PLACEMENT N/A 10/21/2017   Procedure: INSERTION PORT-A-CATH;  Surgeon: Robert Bellow, MD;  Location: [facility] ORS;  Service: General;  Laterality: N/A;   REMOVAL OF TISSUE EXPANDER AND PLACEMENT OF IMPLANT Left 05/13/2018   Procedure: REMOVAL OF TISSUE EXPANDER AND PLACEMENT OF IMPLANT;  Surgeon: Wallace Going, DO;  Location: [facility] ORS;  Service: Plastics;  Laterality: Left;  total surgery time should be 3 hours, per provider   SAVORY DILATION N/A 01/12/2015   Procedure: SAVORY DILATION;  Surgeon: Manya Silvas, MD;  Location: [facility] ENDOSCOPY;  Service: Endoscopy;  Laterality: N/A;   TONSILLECTOMY AND ADENOIDECTOMY  1956    Family History  Problem Relation Age of Onset   Breast cancer Sister 53       currently 62   Breast cancer Paternal Grandmother 47       bilateral; deceased 53s    Breast cancer Cousin 46       daughter of an unaffected maternal aunt   Breast cancer Cousin 62       kidney cancer also; daughter of an unaffected maternal aunt   Other Mother        TAH/BSO prior to menopause   Prostate cancer Paternal Grandfather        age at dx unknown   Breast cancer Maternal Aunt        age at dx unknown   Breast cancer Cousin 55        daughter of an unaffected maternal aunt    Social History:  reports that she has never smoked. She has never used smokeless tobacco. She reports that she does not drink alcohol or use drugs. Her daughter, [name], is a gastroenterology PA. She is a retired Customer service manager for 35 years. Patient denies known exposures to radiation on toxins. She has a grandchild on the way. The patient is alone today.  Allergies:  Allergies  Allergen Reactions   Penicillin G Shortness Of Breath, Swelling, Rash and Other (See Comments)    Has patient had a PCN reaction causing immediate rash, facial/tongue/throat swelling, SOB or lightheadedness with hypotension: Yes Has patient had a PCN reaction causing severe rash involving mucus membranes or skin necrosis: No Has patient had a PCN reaction that required hospitalization: No Has patient had a PCN reaction occurring within the last 10  years: No If all of the above answers are "NO", then may proceed with Cephalosporin use.   Taxotere [Docetaxel] Shortness Of Breath, Palpitations and Other (See Comments)    Back pain, Back Spasms   Other Other (See Comments)    Dodecyl gallate: Positive patch test  Elta MD UV Clear SPF 46: Positive patch test  fragrance dodocylgallate dodocylgallate   Parabens Other (See Comments)    Positive patch test    Shellac Other (See Comments)    Positive patch test    Fluocinonide Rash   Latex Rash   Tape Dermatitis   Triamcinolone Rash    Current Medications: Current Outpatient Medications  Medication Sig Dispense Refill   atenolol (TENORMIN) 50 MG tablet Take 50 mg by mouth every morning.      cetirizine (ZYRTEC) 10 MG tablet Take 10 mg by mouth daily before lunch.      citalopram (CELEXA) 20 MG tablet Take 20 mg by mouth at bedtime.      letrozole (FEMARA) 2.5 MG tablet Take 1 tablet (2.5 mg total) by mouth daily. Future refill will be AFTER MD visit and lab check. 90 tablet 3   pantoprazole (PROTONIX) 40 MG  tablet Take 40 mg by mouth 2 (two) times daily as needed (heartburn).      PREMARIN vaginal cream      triamterene-hydrochlorothiazide (MAXZIDE-25) 37.5-25 MG tablet Take 1 tablet by mouth every morning. 1/2 tab     valACYclovir (VALTREX) 1000 MG tablet Take 1,000 mg by mouth See admin instructions. Take 1000 mg in the morning , may take a second 1000 mg dose as needed for fever blisters      albuterol (PROVENTIL HFA;VENTOLIN HFA) 108 (90 Base) MCG/ACT inhaler Inhale 2 puffs into the lungs every 6 (six) hours as needed for wheezing or shortness of breath. (Patient not taking: Reported on 12/05/2018) 1 Inhaler 1   ciprofloxacin (CIPRO) 500 MG tablet Take 1 tablet (500 mg total) by mouth 2 (two) times daily for 3 days. (Patient not taking: Reported on 12/05/2018) 6 tablet 0   Desoximetasone (TOPICORT) 0.25 % ointment      diazepam (VALIUM) 2 MG tablet Take 1 tablet (2 mg total) by mouth every 6 (six) hours as needed for anxiety. (Patient not taking: Reported on 12/05/2018) 30 tablet 0   HYDROcodone-acetaminophen (NORCO) 5-325 MG tablet Take 1 tablet by mouth every 12 (twelve) hours as needed for up to 7 days for moderate pain. (Patient not taking: Reported on 12/05/2018) 14 tablet 0   ondansetron (ZOFRAN) 4 MG tablet Take 1 tablet (4 mg total) by mouth every 8 (eight) hours as needed for up to 5 days. (Patient not taking: Reported on 12/05/2018) 15 tablet 0   No current facility-administered medications for this visit.     Review of Systems  Constitutional: Negative.  Negative for chills, diaphoresis, fever, malaise/fatigue and weight loss (up 4 lbs).       Feels "ok".  Not back to baseline energy level post treatment.  HENT: Negative.  Negative for congestion, ear pain, nosebleeds, sinus pain, sore throat and tinnitus.   Eyes: Negative.  Negative for blurred vision, double vision, photophobia and pain.  Respiratory: Negative.  Negative for cough, hemoptysis, sputum production, shortness of  breath and wheezing.   Cardiovascular: Negative.  Negative for chest pain, palpitations, orthopnea, leg swelling and PND.  Gastrointestinal: Positive for diarrhea (loose stools). Negative for abdominal pain, blood in stool, constipation, heartburn, melena, nausea and vomiting.  Genitourinary: Negative.  Negative for dysuria, frequency, hematuria and urgency.       Urine leakage.    Musculoskeletal: Negative.  Negative for back pain, falls, joint pain, myalgias and neck pain.       Left 4th digit a little stiff and can get stuck.  Skin: Negative.  Negative for itching and rash.  Neurological: Negative.  Negative for dizziness, tremors, sensory change, speech change, focal weakness, weakness and headaches.  Endo/Heme/Allergies: Negative.  Does not bruise/bleed easily.  Psychiatric/Behavioral: Negative.  Negative for depression and memory loss. The patient is not nervous/anxious and does not have insomnia.   All other systems reviewed and are negative.  Performance status (ECOG):  1  Vitals Blood pressure 138/62, pulse 74, temperature 98.6 F (37 C), temperature source Tympanic, resp. rate 16, weight 163 lb 12.8 oz (74.3 kg), SpO2 98 %.   Physical Exam  Constitutional: She is oriented to person, place, and time. She appears well-developed and well-nourished. No distress.  HENT:  Head: Normocephalic and atraumatic.  Mouth/Throat: No oropharyngeal exudate.  Short gray hair.  Mask.  Eyes: Pupils are equal, round, and reactive to light. Conjunctivae and EOM are normal. No scleral icterus.  Blue eyes.  Neck: Normal range of motion. Neck supple. No JVD present.  Cardiovascular: Normal rate, regular rhythm and normal heart sounds.  No murmur heard. Pulmonary/Chest: Effort normal and breath sounds normal. No respiratory distress. She has no wheezes. She has no rales. Left breast exhibits no skin change (slight inferior edema) and no tenderness.  s/p left mastectomy with saline implant. Right  breast with fibrocystic changes in outer quadrant.   Abdominal: Soft. Bowel sounds are normal. She exhibits no mass. There is no abdominal tenderness. There is no rebound and no guarding.  Musculoskeletal: Normal range of motion.        General: No edema.  Lymphadenopathy:    She has no cervical adenopathy.    She has no axillary adenopathy.       Right: No supraclavicular adenopathy present.       Left: No supraclavicular adenopathy present.  Neurological: She is alert and oriented to person, place, and time. She has normal reflexes.  Skin: Skin is warm and dry. No rash noted. She is not diaphoretic. No erythema. No pallor.  Psychiatric: She has a normal mood and affect. Her behavior is normal. Judgment and thought content normal.  Nursing note and vitals reviewed.   Appointment on 12/06/2018  Component Date Value Ref Range Status   Sodium 12/06/2018 136  135 - 145 mmol/L Final   Potassium 12/06/2018 3.7  3.5 - 5.1 mmol/L Final   Chloride 12/06/2018 103  98 - 111 mmol/L Final   CO2 12/06/2018 25  22 - 32 mmol/L Final   Glucose, Bld 12/06/2018 115* 70 - 99 mg/dL Final   BUN 12/06/2018 18  8 - 23 mg/dL Final   Creatinine, Ser 12/06/2018 1.08* 0.44 - 1.00 mg/dL Final   Calcium 12/06/2018 8.7* 8.9 - 10.3 mg/dL Final   Total Protein 12/06/2018 6.5  6.5 - 8.1 g/dL Final   Albumin 12/06/2018 3.6  3.5 - 5.0 g/dL Final   AST 12/06/2018 16  15 - 41 U/L Final   ALT 12/06/2018 17  0 - 44 U/L Final   Alkaline Phosphatase 12/06/2018 96  38 - 126 U/L Final   Total Bilirubin 12/06/2018 0.6  0.3 - 1.2 mg/dL Final   GFR calc non Af Amer 12/06/2018 52* >60 mL/min Final   GFR calc Af Wyvonnia Lora  12/06/2018 >60  >60 mL/min Final   Anion gap 12/06/2018 8  5 - 15 Final   Performed at [facility], [address], [place], [place] [address]   WBC 12/06/2018 5.0  4.0 - 10.5 K/uL Final   RBC 12/06/2018 4.29  3.87 - 5.11 MIL/uL Final   Hemoglobin 12/06/2018 13.5  12.0 - 15.0 g/dL  Final   HCT 12/06/2018 38.7  36.0 - 46.0 % Final   MCV 12/06/2018 90.2  80.0 - 100.0 fL Final   MCH 12/06/2018 31.5  26.0 - 34.0 pg Final   MCHC 12/06/2018 34.9  30.0 - 36.0 g/dL Final   RDW 12/06/2018 11.9  11.5 - 15.5 % Final   Platelets 12/06/2018 184  150 - 400 K/uL Final   nRBC 12/06/2018 0.0  0.0 - 0.2 % Final   Neutrophils Relative % 12/06/2018 66  % Final   Neutro Abs 12/06/2018 3.3  1.7 - 7.7 K/uL Final   Lymphocytes Relative 12/06/2018 22  % Final   Lymphs Abs 12/06/2018 1.1  0.7 - 4.0 K/uL Final   Monocytes Relative 12/06/2018 7  % Final   Monocytes Absolute 12/06/2018 0.3  0.1 - 1.0 K/uL Final   Eosinophils Relative 12/06/2018 4  % Final   Eosinophils Absolute 12/06/2018 0.2  0.0 - 0.5 K/uL Final   Basophils Relative 12/06/2018 0  % Final   Basophils Absolute 12/06/2018 0.0  0.0 - 0.1 K/uL Final   Immature Granulocytes 12/06/2018 1  % Final   Abs Immature Granulocytes 12/06/2018 0.03  0.00 - 0.07 K/uL Final   Performed at [facility], [address], [place], [place] [address]    Assessment:  Mariah Hogan is a 68 y.o. female with clinical stage IA left breast cancers/p neoadjuvant chemotherapy followed by mastectomy,sentinel lymph node biopsy, and breast reconstruction with placement of a tissue expander on 01/27/2018. Pathologyrevealed a 1.1 cm grade II invasive mammary carcinoma of no special type. There was no lymphovascular invasion. 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 Margins were negative. There were 2 negative sentinel lymph nodes. Pathologic stagewas ypT1c ypN0.  Original breast biopsy on 08/04/2017 revealed at least grade II invasive ductal carcinoma with DCIS. Tumor was ER + (95%), PR+ (20%), and Her 2 neu - by FISH. Ki67 was 80%.   MammaPrintwas high risk luminal-type B with a 10 year risk of recurrence untreated of 29% and 94.6% of distant metastasis free interval with chemotherapy + hormonal therapy.  She  receivedneoadjuvant Femara(08/12/2017 - 08/31/2017)secondary to planned delay in surgery (mastectomy with immediate reconstruction). Femara was discontinued after high risk Mammaprint returned. She received 1 cycle of neoadjuvant Taxotere and Cytoxan(09/03/2017). Cycle #2 on 10/01/2017 was truncated secondary to a Taxotere infusion reaction (back pain, SOB, shaking). She received 3 cycles ofneoadjuvant AC (10/27/2017 - 12/08/2017) with Udencya support.  She began [facility] 02/09/2018. She is tolerating it well.  CA27.29 has been followed:  17.3 on 08/06/2017, 17.0 on 06/09/2018, 25.2 on 08/31/2018, and 18.4 on 12/06/2018.  Bilateral breast MRIon 08/20/2017 revealed a 2.4 cm enhancing irregular mass in the outer left breast. There were several prominent lymph nodes with borderline thicked cortices between 3-4 mm, but normal morphology. There was a 5 mm enhancing mass in the anterior, retroareolar region of the right breast. RIGHT breast retroareolar biopsyon 09/02/2017 revealed no evidence of malignancy. There was fibrocystic change and usual ductal hyperplasia.  Diagnostic right mammogram and ultrasound on 10/18/2018 revealed no suspicious mass, malignant type microcalcifications or distortion detected in  the right breast. Spot tangential view of the area of clinical concern in the right breast showed normal fibroglandular tissue. There was no solid or cystic mass, abnormal shadowing or distortion visualized.  Bilateral breast MRI on 11/09/2018 revealed left mastectomy with implant reconstruction.  There was no suspicious enhancement.  Right breast was negative.  Chest CTon 09/23/2016 revealed a 4 mm RUL subpleural nodule. Chest CTon 08/17/2017 revealed a small 4 mm RUL nodule unchanged in size.  [place] 10/14/2017 revealed an EF of 55-60%,  Bone densityon 08/17/2017 revealed osteopeniawith a T-score of -1.5 in the left femoral neck and -0.7 in the AP spine L1-L4.  She has  a significant family history of breast cancer(paternal grandmother, sister, and 3 maternal cousins). 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  She was admitted to [facility] 09/11/2017 - 09/15/2017 with enteropathogenic E.Coli (EPEC)and associated acute renal failure(creatinine 1.87; CrCl 28.3 ml/min). She was treated with intravenous azithromycin and ceftriaxone.   Symptomatically, she remains fatigued.  Exam is unremarkable.  Plan: 1.   Labs today: CBC with diff, CMP, CA27.29. 2.   Stage IA left breast cancer Clinically, she is doing well.  She is s/p neoadjuvant chemotherapy followed by mastectomy.  Review interval mammogram and breast MRI. Reconstructive surgery planned for 12/23/2018. Continue Femara. 3. Osteopenia She is on calcium and vitamin D.  Review plan for bone density in 08/2019.  Plan for Prolia if bone density declines. 4.  RTC in 4 months for MD assessment and labs (CBC with diff, CMP, CA 27.29).  I discussed the assessment and treatment plan with the patient.  The patient was provided an opportunity to ask questions and all were answered.  The patient agreed with the plan and demonstrated an understanding of the instructions.  The patient was advised to call back if the symptoms worsen or if the condition fails to improve as anticipated.   Lequita Asal, MD, PhD    12/06/2018, 12:07 PM  I, Jacqualyn Posey, am acting as Education administrator for [employer]. Mike Gip, MD, PhD.  I, Aubrei Bouchie C. Mike Gip, MD, have reviewed the above documentation for accuracy and completeness, and I agree with the above.

## 2018-12-07 LAB — CANCER ANTIGEN 27.29: CA 27.29: 18.4 U/mL (ref 0.0–38.6)

## 2018-12-17 ENCOUNTER — Other Ambulatory Visit: Payer: Self-pay

## 2018-12-17 ENCOUNTER — Encounter (HOSPITAL_BASED_OUTPATIENT_CLINIC_OR_DEPARTMENT_OTHER): Payer: Self-pay

## 2018-12-20 ENCOUNTER — Other Ambulatory Visit
Admission: RE | Admit: 2018-12-20 | Discharge: 2018-12-20 | Disposition: A | Discharge status: Home / Self Care | Payer: Medicare Other | Source: Ambulatory Visit | Attending: Plastic Surgery | Admitting: Plastic Surgery

## 2018-12-20 ENCOUNTER — Other Ambulatory Visit (HOSPITAL_COMMUNITY): Payer: Medicare Other

## 2018-12-20 DIAGNOSIS — Z01812 Encounter for preprocedural laboratory examination: Secondary | ICD-10-CM | POA: Diagnosis present

## 2018-12-20 DIAGNOSIS — Z20828 Contact with and (suspected) exposure to other viral communicable diseases: Secondary | ICD-10-CM | POA: Diagnosis not present

## 2018-12-21 LAB — SARS CORONAVIRUS 2 (TAT 6-24 HRS): SARS Coronavirus 2: NEGATIVE

## 2018-12-23 ENCOUNTER — Ambulatory Visit (HOSPITAL_BASED_OUTPATIENT_CLINIC_OR_DEPARTMENT_OTHER): Payer: Medicare Other | Admitting: Certified Registered"

## 2018-12-23 ENCOUNTER — Encounter (HOSPITAL_BASED_OUTPATIENT_CLINIC_OR_DEPARTMENT_OTHER): Payer: Self-pay | Admitting: Certified Registered"

## 2018-12-23 ENCOUNTER — Other Ambulatory Visit: Payer: Self-pay

## 2018-12-23 ENCOUNTER — Encounter (HOSPITAL_BASED_OUTPATIENT_CLINIC_OR_DEPARTMENT_OTHER)
Admission: RE | Disposition: A | Discharge status: Home / Self Care | Payer: Self-pay | Source: Home / Self Care | Attending: Plastic Surgery

## 2018-12-23 ENCOUNTER — Ambulatory Visit (HOSPITAL_BASED_OUTPATIENT_CLINIC_OR_DEPARTMENT_OTHER)
Admission: RE | Admit: 2018-12-23 | Discharge: 2018-12-24 | Disposition: A | Discharge status: Home / Self Care | Payer: Medicare Other | Attending: Plastic Surgery | Admitting: Plastic Surgery

## 2018-12-23 DIAGNOSIS — Z79899 Other long term (current) drug therapy: Secondary | ICD-10-CM | POA: Insufficient documentation

## 2018-12-23 DIAGNOSIS — Z9221 Personal history of antineoplastic chemotherapy: Secondary | ICD-10-CM | POA: Diagnosis not present

## 2018-12-23 DIAGNOSIS — Z79811 Long term (current) use of aromatase inhibitors: Secondary | ICD-10-CM | POA: Insufficient documentation

## 2018-12-23 DIAGNOSIS — C50912 Malignant neoplasm of unspecified site of left female breast: Secondary | ICD-10-CM

## 2018-12-23 DIAGNOSIS — J45909 Unspecified asthma, uncomplicated: Secondary | ICD-10-CM | POA: Insufficient documentation

## 2018-12-23 DIAGNOSIS — I10 Essential (primary) hypertension: Secondary | ICD-10-CM | POA: Insufficient documentation

## 2018-12-23 DIAGNOSIS — K219 Gastro-esophageal reflux disease without esophagitis: Secondary | ICD-10-CM | POA: Diagnosis not present

## 2018-12-23 DIAGNOSIS — Z85828 Personal history of other malignant neoplasm of skin: Secondary | ICD-10-CM | POA: Insufficient documentation

## 2018-12-23 DIAGNOSIS — N651 Disproportion of reconstructed breast: Secondary | ICD-10-CM | POA: Insufficient documentation

## 2018-12-23 DIAGNOSIS — Z170 Estrogen receptor positive status [ER+]: Secondary | ICD-10-CM | POA: Insufficient documentation

## 2018-12-23 DIAGNOSIS — C50412 Malignant neoplasm of upper-outer quadrant of left female breast: Secondary | ICD-10-CM | POA: Insufficient documentation

## 2018-12-23 DIAGNOSIS — Z9012 Acquired absence of left breast and nipple: Secondary | ICD-10-CM

## 2018-12-23 HISTORY — PX: LIPOSUCTION WITH LIPOFILLING: SHX6436

## 2018-12-23 SURGERY — LIPOSUCTION, WITH FAT TRANSFER
Anesthesia: General | Site: Breast | Laterality: Left

## 2018-12-23 MED ORDER — MORPHINE SULFATE (PF) 4 MG/ML IV SOLN: 2.0000 mg | INTRAVENOUS | Status: DC | PRN

## 2018-12-23 MED ORDER — OXYCODONE HCL 5 MG/5ML PO SOLN: 5.0000 mg | Freq: Once | ORAL | Status: DC | PRN

## 2018-12-23 MED ORDER — SENNA 8.6 MG PO TABS: 1.0000 | ORAL_TABLET | Freq: Two times a day (BID) | ORAL | Status: DC

## 2018-12-23 MED ORDER — PROPOFOL 10 MG/ML IV BOLUS
INTRAVENOUS | Status: AC
  Filled 2018-12-23: qty 20

## 2018-12-23 MED ORDER — ONDANSETRON HCL 4 MG/2ML IJ SOLN
INTRAMUSCULAR | Status: AC
  Filled 2018-12-23: qty 2

## 2018-12-23 MED ORDER — DEXAMETHASONE SODIUM PHOSPHATE 10 MG/ML IJ SOLN
INTRAMUSCULAR | Status: DC | PRN
  Administered 2018-12-23: 5 mg via INTRAVENOUS

## 2018-12-23 MED ORDER — OXYCODONE HCL 5 MG PO TABS: 5.0000 mg | ORAL_TABLET | Freq: Once | ORAL | Status: DC | PRN

## 2018-12-23 MED ORDER — LIDOCAINE HCL 1 % IJ SOLN
INTRAVENOUS | Status: DC | PRN
  Administered 2018-12-23: 1000 mL

## 2018-12-23 MED ORDER — PROPOFOL 500 MG/50ML IV EMUL
INTRAVENOUS | Status: DC | PRN
  Administered 2018-12-23: 120 ug/kg/min via INTRAVENOUS

## 2018-12-23 MED ORDER — FENTANYL CITRATE (PF) 250 MCG/5ML IJ SOLN
INTRAMUSCULAR | Status: DC | PRN
  Administered 2018-12-23 (×6): 25 ug via INTRAVENOUS

## 2018-12-23 MED ORDER — MIDAZOLAM HCL 2 MG/2ML IJ SOLN: 1.0000 mg | INTRAMUSCULAR | Status: DC | PRN

## 2018-12-23 MED ORDER — CIPROFLOXACIN IN D5W 400 MG/200ML IV SOLN
400.0000 mg | Freq: Two times a day (BID) | INTRAVENOUS | Status: DC
  Administered 2018-12-23: 400 mg via INTRAVENOUS
  Filled 2018-12-23: qty 200

## 2018-12-23 MED ORDER — DIPHENHYDRAMINE HCL 50 MG/ML IJ SOLN
12.5000 mg | Freq: Four times a day (QID) | INTRAMUSCULAR | Status: DC | PRN
  Administered 2018-12-23: 12.5 mg via INTRAVENOUS
  Filled 2018-12-23: qty 1

## 2018-12-23 MED ORDER — CHLORHEXIDINE GLUCONATE CLOTH 2 % EX PADS: 6.0000 | MEDICATED_PAD | Freq: Once | CUTANEOUS | Status: DC

## 2018-12-23 MED ORDER — LIDOCAINE-EPINEPHRINE 1 %-1:100000 IJ SOLN
INTRAMUSCULAR | Status: DC | PRN
  Administered 2018-12-23: 6 mL

## 2018-12-23 MED ORDER — CIPROFLOXACIN IN D5W 400 MG/200ML IV SOLN
INTRAVENOUS | Status: AC
  Filled 2018-12-23: qty 200

## 2018-12-23 MED ORDER — FENTANYL CITRATE (PF) 100 MCG/2ML IJ SOLN
25.0000 ug | INTRAMUSCULAR | Status: DC | PRN
  Administered 2018-12-23: 50 ug via INTRAVENOUS

## 2018-12-23 MED ORDER — ACETAMINOPHEN 325 MG PO TABS: 325.0000 mg | ORAL_TABLET | ORAL | Status: DC | PRN

## 2018-12-23 MED ORDER — PROPOFOL 10 MG/ML IV BOLUS
INTRAVENOUS | Status: DC | PRN
  Administered 2018-12-23: 200 mg via INTRAVENOUS

## 2018-12-23 MED ORDER — DIAZEPAM 2 MG PO TABS: 2.0000 mg | ORAL_TABLET | Freq: Two times a day (BID) | ORAL | Status: DC | PRN

## 2018-12-23 MED ORDER — CIPROFLOXACIN IN D5W 400 MG/200ML IV SOLN
400.0000 mg | INTRAVENOUS | Status: AC
  Administered 2018-12-23: 400 mg via INTRAVENOUS

## 2018-12-23 MED ORDER — ONDANSETRON HCL 4 MG/2ML IJ SOLN
INTRAMUSCULAR | Status: DC | PRN
  Administered 2018-12-23: 4 mg via INTRAVENOUS

## 2018-12-23 MED ORDER — MEPERIDINE HCL 25 MG/ML IJ SOLN: 6.2500 mg | INTRAMUSCULAR | Status: DC | PRN

## 2018-12-23 MED ORDER — DIPHENHYDRAMINE HCL 12.5 MG/5ML PO ELIX: 12.5000 mg | ORAL_SOLUTION | Freq: Four times a day (QID) | ORAL | Status: DC | PRN

## 2018-12-23 MED ORDER — HYDROCODONE-ACETAMINOPHEN 5-325 MG PO TABS
1.0000 | ORAL_TABLET | ORAL | Status: DC | PRN
  Administered 2018-12-23: 1 via ORAL
  Administered 2018-12-23 – 2018-12-24 (×2): 2 via ORAL
  Filled 2018-12-23: qty 2
  Filled 2018-12-23: qty 1
  Filled 2018-12-23: qty 2

## 2018-12-23 MED ORDER — KCL IN DEXTROSE-NACL 20-5-0.45 MEQ/L-%-% IV SOLN
INTRAVENOUS | Status: DC
  Administered 2018-12-23: 14:00:00 via INTRAVENOUS
  Filled 2018-12-23: qty 1000

## 2018-12-23 MED ORDER — FENTANYL CITRATE (PF) 100 MCG/2ML IJ SOLN: 50.0000 ug | INTRAMUSCULAR | Status: DC | PRN

## 2018-12-23 MED ORDER — LIDOCAINE 2% (20 MG/ML) 5 ML SYRINGE
INTRAMUSCULAR | Status: DC | PRN
  Administered 2018-12-23: 80 mg via INTRAVENOUS

## 2018-12-23 MED ORDER — LACTATED RINGERS IV SOLN
INTRAVENOUS | Status: DC
  Administered 2018-12-23: 09:00:00 via INTRAVENOUS

## 2018-12-23 MED ORDER — FENTANYL CITRATE (PF) 100 MCG/2ML IJ SOLN
INTRAMUSCULAR | Status: AC
  Filled 2018-12-23: qty 2

## 2018-12-23 MED ORDER — ACETAMINOPHEN 160 MG/5ML PO SOLN: 325.0000 mg | ORAL | Status: DC | PRN

## 2018-12-23 MED ORDER — ONDANSETRON HCL 4 MG/2ML IJ SOLN: 4.0000 mg | Freq: Once | INTRAMUSCULAR | Status: DC | PRN

## 2018-12-23 MED ORDER — PROPOFOL 10 MG/ML IV BOLUS
INTRAVENOUS | Status: AC
  Filled 2018-12-23: qty 40

## 2018-12-23 MED ORDER — LIDOCAINE 2% (20 MG/ML) 5 ML SYRINGE
INTRAMUSCULAR | Status: AC
  Filled 2018-12-23: qty 5

## 2018-12-23 MED ORDER — NAPROXEN 500 MG PO TABS: 500.0000 mg | ORAL_TABLET | Freq: Two times a day (BID) | ORAL | Status: DC | PRN

## 2018-12-23 SURGICAL SUPPLY — 51 items
BINDER ABDOMINAL  9 SM 30-45 (SOFTGOODS)
BINDER ABDOMINAL 10 UNV 27-48 (MISCELLANEOUS) IMPLANT
BINDER ABDOMINAL 12 SM 30-45 (SOFTGOODS) ×3 IMPLANT
BINDER ABDOMINAL 9 SM 30-45 (SOFTGOODS) IMPLANT
BINDER BREAST LRG (GAUZE/BANDAGES/DRESSINGS) IMPLANT
BINDER BREAST MEDIUM (GAUZE/BANDAGES/DRESSINGS) IMPLANT
BINDER BREAST XLRG (GAUZE/BANDAGES/DRESSINGS) ×3 IMPLANT
BINDER BREAST XXLRG (GAUZE/BANDAGES/DRESSINGS) IMPLANT
BLADE HEX COATED 2.75 (ELECTRODE) IMPLANT
BLADE SURG 15 STRL LF DISP TIS (BLADE) ×1 IMPLANT
BLADE SURG 15 STRL SS (BLADE) ×2
BNDG GAUZE ELAST 4 BULKY (GAUZE/BANDAGES/DRESSINGS) IMPLANT
CHLORAPREP W/TINT 26 (MISCELLANEOUS) ×6 IMPLANT
COVER BACK TABLE REUSABLE LG (DRAPES) ×3 IMPLANT
COVER MAYO STAND REUSABLE (DRAPES) ×3 IMPLANT
COVER WAND RF STERILE (DRAPES) IMPLANT
DECANTER SPIKE VIAL GLASS SM (MISCELLANEOUS) IMPLANT
DERMABOND ADVANCED (GAUZE/BANDAGES/DRESSINGS) ×2
DERMABOND ADVANCED .7 DNX12 (GAUZE/BANDAGES/DRESSINGS) ×1 IMPLANT
DRAPE LAPAROSCOPIC ABDOMINAL (DRAPES) ×3 IMPLANT
DRSG PAD ABDOMINAL 8X10 ST (GAUZE/BANDAGES/DRESSINGS) ×6 IMPLANT
ELECT REM PT RETURN 9FT ADLT (ELECTROSURGICAL) ×3
ELECTRODE REM PT RTRN 9FT ADLT (ELECTROSURGICAL) ×1 IMPLANT
EXTRACTOR CANIST REVOLVE STRL (CANNISTER) ×3 IMPLANT
GLOVE BIO SURGEON STRL SZ 6.5 (GLOVE) IMPLANT
GLOVE BIO SURGEONS STRL SZ 6.5 (GLOVE)
GLOVE SURG SS PI 6.5 STRL IVOR (GLOVE) ×9 IMPLANT
GLOVE SURG SS PI 7.0 STRL IVOR (GLOVE) ×3 IMPLANT
GOWN STRL REUS W/ TWL LRG LVL3 (GOWN DISPOSABLE) ×3 IMPLANT
GOWN STRL REUS W/TWL LRG LVL3 (GOWN DISPOSABLE) ×6
IV LACTATED RINGERS 1000ML (IV SOLUTION) ×9 IMPLANT
LINER CANISTER 1000CC FLEX (MISCELLANEOUS) ×9 IMPLANT
NDL SAFETY ECLIPSE 18X1.5 (NEEDLE) ×1 IMPLANT
NEEDLE HYPO 18GX1.5 SHARP (NEEDLE) ×2
NEEDLE HYPO 25X1 1.5 SAFETY (NEEDLE) ×3 IMPLANT
PACK BASIN DAY SURGERY FS (CUSTOM PROCEDURE TRAY) ×3 IMPLANT
PAD ALCOHOL SWAB (MISCELLANEOUS) ×3 IMPLANT
PENCIL BUTTON HOLSTER BLD 10FT (ELECTRODE) ×3 IMPLANT
SLEEVE SCD COMPRESS KNEE MED (MISCELLANEOUS) ×3 IMPLANT
SPONGE LAP 18X18 RF (DISPOSABLE) ×3 IMPLANT
SUT MNCRL AB 4-0 PS2 18 (SUTURE) IMPLANT
SUT MON AB 5-0 PS2 18 (SUTURE) ×6 IMPLANT
SYR 10ML LL (SYRINGE) ×12 IMPLANT
SYR 3ML 18GX1 1/2 (SYRINGE) IMPLANT
SYR 50ML LL SCALE MARK (SYRINGE) ×6 IMPLANT
SYR CONTROL 10ML LL (SYRINGE) ×3 IMPLANT
SYR TOOMEY 50ML (SYRINGE) ×3 IMPLANT
TOWEL GREEN STERILE FF (TOWEL DISPOSABLE) ×6 IMPLANT
TUBING INFILTRATION IT-10001 (TUBING) ×3 IMPLANT
TUBING SET GRADUATE ASPIR 12FT (MISCELLANEOUS) ×6 IMPLANT
UNDERPAD 30X36 HEAVY ABSORB (UNDERPADS AND DIAPERS) ×6 IMPLANT

## 2018-12-23 NOTE — Op Note (Addendum)
DATE OF OPERATION: 12/23/2018  LOCATION: [facility]  PREOPERATIVE DIAGNOSIS: Breast asymmetry after breast reconstruction for cancer  POSTOPERATIVE DIAGNOSIS: Same  PROCEDURE: left breast lipofilling for symmetry  SURGEON: Lyndee Leo Sanger Otto Felkins, DO  ASSISTANT: Roetta Sessions, PA  EBL: 10 cc  CONDITION: Stable  COMPLICATIONS: None  INDICATION: The patient, Mariah Hogan, is a 68 y.o. female born on 07-Nov-1950, is here for treatment of breast asymmetry after breast reconstruction for cancer.   PROCEDURE DETAILS:  The patient was seen prior to surgery and marked.  The IV antibiotics were given. The patient was taken to the operating room and given a general anesthetic. A standard time out was performed and all information was confirmed by those in the room. SCDs were placed.   The chest and abdomen were prepped and draped.  Local with epinepherine was injected at the periumbilical 5 mm site. The tumescent was infused into the abdominal fat.  We waiting several minutes for the local to take effect.  The liposuction machine was then used to obtain the fat for transfer.  This was collected and cleaned by the manufactured guidelines for the Revolve. A 5 mm incision was made at the lateral aspect of the left breast previous incision.  The 110 cc of fat was injected at the superior, medial and lateral aspect of the breast with a cannula.  It was placed in the fat layer. The incision were closed with the 5-0 Monocryl. The patient was placed in a breast binder and abdominal binder. The patient was allowed to wake up and taken to recovery room in stable condition at the end of the case. The family was notified at the end of the case.   The advanced practice practitioner (APP) assisted throughout the case.  The APP was essential in retraction and counter traction when needed to make the case progress smoothly.  This retraction and assistance made it possible to see the tissue plans  for the procedure.  The assistance was needed for blood control, tissue re-approximation and assisted with closure of the incision site.

## 2018-12-23 NOTE — Anesthesia Preprocedure Evaluation (Addendum)
Anesthesia Evaluation  Patient identified by MRN, date of birth, ID band Patient awake    Reviewed: Allergy & Precautions, NPO status , Patient's Chart, lab work & pertinent test results  History of Anesthesia Complications (+) PONV, PROLONGED EMERGENCE and history of anesthetic complications  Airway Mallampati: II  TM Distance: >3 FB Neck ROM: Full    Dental no notable dental hx.    Pulmonary asthma , neg sleep apnea,    breath sounds clear to auscultation- rhonchi (-) wheezing      Cardiovascular hypertension, Pt. on medications (-) CAD, (-) Past MI, (-) Cardiac Stents and (-) CABG + dysrhythmias Atrial Fibrillation  Rhythm:Regular Rate:Normal - Systolic murmurs and - Diastolic murmurs Normal sinus rhythm Nonspecific ST and T wave abnormality Prolonged QT    Neuro/Psych neg Seizures negative neurological ROS  negative psych ROS   GI/Hepatic Neg liver ROS, hiatal hernia, GERD  Medicated and Controlled,  Endo/Other  negative endocrine ROSneg diabetes  Renal/GU Renal InsufficiencyRenal disease     Musculoskeletal  (+) Arthritis , Osteoarthritis,    Abdominal (+) - obese,   Peds  Hematology  (+) Blood dyscrasia, anemia ,   Anesthesia Other Findings     Reproductive/Obstetrics                            Anesthesia Physical  Anesthesia Plan  ASA: III  Anesthesia Plan: General   Post-op Pain Management:    Induction: Intravenous  PONV Risk Score and Plan: 2 and Ondansetron, Dexamethasone, Midazolam and TIVA  Airway Management Planned: LMA  Additional Equipment:   Intra-op Plan:   Post-operative Plan: Extubation in OR  Informed Consent: I have reviewed the patients History and Physical, chart, labs and discussed the procedure including the risks, benefits and alternatives for the proposed anesthesia with the patient or authorized representative who has indicated his/her  understanding and acceptance.     Dental advisory given  Plan Discussed with: CRNA and Anesthesiologist  Anesthesia Plan Comments:         Anesthesia Quick Evaluation

## 2018-12-23 NOTE — Transfer of Care (Signed)
Immediate Anesthesia Transfer of Care Note  Patient: Mariah Hogan  Procedure(s) Performed: LIPOSUCTION WITH LIPOFILLING FROM ABDOMEN TO LEFT BREAST (Left Breast)  Patient Location: PACU  Anesthesia Type:General  Level of Consciousness: awake, alert , oriented and patient cooperative  Airway & Oxygen Therapy: Patient Spontanous Breathing and Patient connected to face mask oxygen  Post-op Assessment: Report given to RN, Post -op Vital signs reviewed and stable and Patient moving all extremities  Post vital signs: Reviewed and stable  Last Vitals:  Vitals Value Taken Time  BP 151/80 12/23/18 1019  Temp    Pulse 81 12/23/18 1020  Resp 14 12/23/18 1020  SpO2 96 % 12/23/18 1020  Vitals shown include unvalidated device data.  Last Pain:  Vitals:   12/23/18 0817  TempSrc: Oral  PainSc: 0-No pain      Patients Stated Pain Goal: 3 (89/16/94 5038)  Complications: No apparent anesthesia complications

## 2018-12-23 NOTE — Interval H&P Note (Signed)
History and Physical Interval Note:  12/23/2018 8:22 AM  Mariah Hogan  has presented today for surgery, with the diagnosis of Acquired Absence Of Left Breast; Breast asymmetry following reconstructive surgery; Invasive ductal carcinoma of left breast.  The various methods of treatment have been discussed with the patient and family. After consideration of risks, benefits and other options for treatment, the patient has consented to  Procedure(s) with comments: LIPOSUCTION WITH LIPOFILLING (Left) - 90 min, please as a surgical intervention.  The patient's history has been reviewed, patient examined, no change in status, stable for surgery.  I have reviewed the patient's chart and labs.  Questions were answered to the patient's satisfaction.     Loel Lofty Risha Barretta

## 2018-12-23 NOTE — Discharge Instructions (Signed)
INSTRUCTIONS FOR AFTER SURGERY   You are having surgery.  You will likely have some questions about what to expect following your operation.  The following information will help you and your family understand what to expect when you are discharged from the hospital.  Following these guidelines will help ensure a smooth recovery and reduce risks of complications.   Postoperative instructions include information on: diet, wound care, medications and physical activity.  AFTER SURGERY Expect to go home after the procedure.  In some cases, you may need to spend one night in the hospital for observation.  DIET This surgery does not require a specific diet.  However, I have to mention that the healthier you eat the better your body can start healing. It is important to increasing your protein intake.  This means limiting the foods with sugar and carbohydrates.  Focus on vegetables and some meat.  If you have any liposuction during your procedure be sure to drink water.  If your urine is bright yellow, then it is concentrated, and you need to drink more water.  As a general rule after surgery, you should have 8 ounces of water every hour while awake.  If you find you are persistently nauseated or unable to take in liquids let us know.  NO TOBACCO USE or EXPOSURE.  This will slow your healing process and increase the risk of a wound.  WOUND CARE If you don't have a drain: You can shower the day after surgery if you don't have a drain.  Use fragrance free soap.  Dial, [place] and [place] are usually mild on the skin.  If you have steri-strips / tape directly attached to your skin leave them in place. It is OK to get these wet.  No baths, pools or hot tubs for two weeks. We close your incision to leave the smallest and best-looking scar. No ointment or creams on your incisions until given the go ahead.  Especially not Neosporin (Too many skin reactions with this one).  A few weeks after surgery you can use Mederma  and start massaging the scar. We ask you to wear your binder or sports bra for the first 6 weeks around the clock, including while sleeping. This provides added comfort and helps reduce the fluid accumulation at the surgery site.  ACTIVITY No heavy lifting until cleared by the doctor.  It is OK to walk and climb stairs. In fact, moving your legs is very important to decrease your risk of a blood clot.  It will also help keep you from getting deconditioned.  Every 1 to 2 hours get up and walk for 5 minutes. This will help with a quicker recovery back to normal.  Let pain be your guide so you don't do too much.  NO, you cannot do the spring cleaning and don't plan on taking care of anyone else.  This is your time for TLC.  You will be more comfortable if you sleep and rest with your head elevated either with a few pillows under you or in a recliner.  No stomach sleeping for a few months.  WORK Everyone returns to work at different times. As a rough guide, most people take at least 1 - 2 weeks off prior to returning to work. If you need documentation for your job, bring the forms to your postoperative follow up visit.  DRIVING Arrange for someone to bring you home from the hospital.  You may be able to drive a few days  after surgery but not while taking any narcotics or valium.  BOWEL MOVEMENTS Constipation can occur after anesthesia and while taking pain medication.  It is important to stay ahead for your comfort.  We recommend taking Milk of Magnesia (2 tablespoons; twice a day) while taking the pain pills.  SEROMA This is fluid your body tried to put in the surgical site.  This is normal but if it creates tight skinny skin let us know.  It usually decreases in a few weeks.  WHEN TO CALL Call your surgeon's office if any of the following occur:  Fever 101 degrees F or greater  Excessive bleeding or fluid from the incision site.  Pain that increases over time without aid from the  medications  Redness, warmth, or pus draining from incision sites  Persistent nausea or inability to take in liquids  Severe misshapen area that underwent the operation.

## 2018-12-23 NOTE — Anesthesia Procedure Notes (Signed)
Procedure Name: LMA Insertion Date/Time: 12/23/2018 9:05 AM Performed by: Myna Bright, CRNA Pre-anesthesia Checklist: Patient identified, Emergency Drugs available, Suction available and Patient being monitored Patient Re-evaluated:Patient Re-evaluated prior to induction Oxygen Delivery Method: Circle system utilized Preoxygenation: Pre-oxygenation with 100% oxygen Induction Type: IV induction Ventilation: Mask ventilation without difficulty LMA: LMA inserted LMA Size: 4.0 Tube type: Oral Number of attempts: 1 Placement Confirmation: positive ETCO2 and breath sounds checked- equal and bilateral Tube secured with: Tape Dental Injury: Teeth and Oropharynx as per pre-operative assessment

## 2018-12-23 NOTE — Anesthesia Postprocedure Evaluation (Signed)
Anesthesia Post Note  Patient: Mariah Hogan  Procedure(s) Performed: LIPOSUCTION WITH LIPOFILLING FROM ABDOMEN TO LEFT BREAST (Left Breast)     Patient location during evaluation: PACU Anesthesia Type: General Level of consciousness: awake and alert Pain management: pain level controlled Vital Signs Assessment: post-procedure vital signs reviewed and stable Respiratory status: spontaneous breathing, nonlabored ventilation, respiratory function stable and patient connected to nasal cannula oxygen Cardiovascular status: blood pressure returned to baseline and stable Postop Assessment: no apparent nausea or vomiting Anesthetic complications: no    Last Vitals:  Vitals:   12/23/18 1245 12/23/18 1300  BP: 118/62 130/72  Pulse: 70 76  Resp: 12 15  Temp:    SpO2: 98% 96%    Last Pain:  Vitals:   12/23/18 1300  TempSrc:   PainSc: 3                  [name]

## 2018-12-24 ENCOUNTER — Encounter (HOSPITAL_BASED_OUTPATIENT_CLINIC_OR_DEPARTMENT_OTHER): Payer: Self-pay | Admitting: Plastic Surgery

## 2018-12-24 DIAGNOSIS — N651 Disproportion of reconstructed breast: Secondary | ICD-10-CM | POA: Diagnosis not present

## 2018-12-24 NOTE — Discharge Summary (Signed)
Physician Discharge Summary  Patient ID: JACKSON FETTERS MRN: [id number] DOB/AGE: [birth year] 68 y.o.  Admit date: 12/23/2018 Discharge date: 12/24/2018  Admission Diagnoses: Breast asymmetry after breast reconstruction for cancer, acquired absence of left breast  Discharge Diagnoses:  Active Problems:   Acquired absence of left breast   Discharged Condition: good  Hospital Course: Mrs. Merkley is a 68 year old female who presented to the [facility] outpatient operating room for left breast lipofilling for symmetry on 12/23/2018.   No acute overnight events other than reaction to cipro infusion. Her pain was controlled on p.o. Vicodin.  She had a reaction on her right wrist/arm to ciprofloxacin infusion.  According to nursing it was at the end of the infusion and she developed itching and red streaks along her veins.  On evaluation this morning she is doing better, there is no rash, streaking, itching.  She reports that she overall feels well and other than pain has no complaints.  No fevers, chills, n/v overnight. Denies SOB, dysuria, CP, dizziness, weakness or fatigue.  Consults: None  Significant Diagnostic Studies: none  Treatments: IV hydration, antibiotics: Cipro and analgesia: Vicodin  Discharge Exam: Blood pressure 131/63, pulse 62, temperature 97.6 F (36.4 C), resp. rate 18, height 5' 6"  (1.676 m), weight 74.8 kg, SpO2 95 %. General appearance: alert, cooperative, no distress and resting in bed Head: Normocephalic, without obvious abnormality, atraumatic Resp: clear to auscultation bilaterally Breasts: normal right breast. left breast with mastectomy scar, implant in place, small incision laterally from lipofilling. Incision c/d/i. Cardio: regular rate and rhythm, S1, S2 normal, no murmur, click, rub or gallop Abdomen: normal BS, TTP. No distention noted. Mild swelling. Extremities: extremities normal, atraumatic, no cyanosis or edema. Right arm where IV in place  without erythema, swelling or rash. Pulses: 2+ and symmetric Skin: Skin color, texture, turgor normal. No rashes or lesions Neurologic: Grossly normal Incision/Wound: Incisions on abdomen C/d/i. Mild bruising noted over abdomen, diffuse. TTP.   Disposition: Discharge disposition: 01-Home or Self Care       Discharge Instructions    Call MD for:  difficulty breathing, headache or visual disturbances   Complete by: As directed    Call MD for:  extreme fatigue   Complete by: As directed    Call MD for:  hives   Complete by: As directed    Call MD for:  persistant dizziness or light-headedness   Complete by: As directed    Call MD for:  persistant nausea and vomiting   Complete by: As directed    Call MD for:  redness, tenderness, or signs of infection (pain, swelling, redness, odor or green/yellow discharge around incision site)   Complete by: As directed    Call MD for:  severe uncontrolled pain   Complete by: As directed    Call MD for:  temperature >100.4   Complete by: As directed    Diet - low sodium heart healthy   Complete by: As directed    Increase activity slowly   Complete by: As directed      Allergies as of 12/24/2018      Reactions   Penicillin G Shortness Of Breath, Swelling, Rash, Other (See Comments)   Has patient had a PCN reaction causing immediate rash, facial/tongue/throat swelling, SOB or lightheadedness with hypotension: Yes Has patient had a PCN reaction causing severe rash involving mucus membranes or skin necrosis: No Has patient had a PCN reaction that required hospitalization: No Has patient had a PCN reaction occurring within  the last 10 years: No If all of the above answers are "NO", then may proceed with Cephalosporin use.   Taxotere [docetaxel] Shortness Of Breath, Palpitations, Other (See Comments)   Back pain, Back Spasms   Ciprofloxacin Rash   Other Other (See Comments)   Dodecyl gallate: Positive patch test  Elta MD UV Clear SPF 46:  Positive patch test  fragrance dodocylgallate dodocylgallate   Parabens Other (See Comments)   Positive patch test    Shellac Other (See Comments)   Positive patch test    Fluocinonide Rash   Latex Rash   Tape Dermatitis   Triamcinolone Rash      Medication List    TAKE these medications   albuterol 108 (90 Base) MCG/ACT inhaler Commonly known as: VENTOLIN HFA Inhale 2 puffs into the lungs every 6 (six) hours as needed for wheezing or shortness of breath.   atenolol 50 MG tablet Commonly known as: TENORMIN Take 50 mg by mouth every morning.   cetirizine 10 MG tablet Commonly known as: ZYRTEC Take 10 mg by mouth daily before lunch.   citalopram 20 MG tablet Commonly known as: CELEXA Take 20 mg by mouth at bedtime.   Desoximetasone 0.25 % ointment Commonly known as: TOPICORT   diazepam 2 MG tablet Commonly known as: Valium Take 1 tablet (2 mg total) by mouth every 6 (six) hours as needed for anxiety.   letrozole 2.5 MG tablet Commonly known as: FEMARA Take 1 tablet (2.5 mg total) by mouth daily. Future refill will be AFTER MD visit and lab check.   pantoprazole 40 MG tablet Commonly known as: PROTONIX Take 40 mg by mouth 2 (two) times daily as needed (heartburn).   Premarin vaginal cream Generic drug: conjugated estrogens   triamterene-hydrochlorothiazide 37.5-25 MG tablet Commonly known as: MAXZIDE-25 Take 1 tablet by mouth every morning. 1/2 tab   valACYclovir 1000 MG tablet Commonly known as: VALTREX Take 1,000 mg by mouth See admin instructions. Take 1000 mg in the morning , may take a second 1000 mg dose as needed for fever blisters      Follow-up Information    Dillingham, Loel Lofty, DO In 1 week.   Specialty: Plastic Surgery Contact information: [address] [place] [address] [phone]          Patient being picked up by fiance. She is feeling well other than abdominal pain controlled by PO pain medications.  Return  precautions and reasons to call the office or go to ER explained.  F/u in 1 week for post-op. Continue to take benadryl at home for a few days.   Signed: Carola Rhine Arnez Stoneking 12/24/2018, 7:43 AM

## 2018-12-30 ENCOUNTER — Telehealth: Payer: Self-pay | Admitting: Plastic Surgery

## 2018-12-30 NOTE — Telephone Encounter (Signed)

## 2018-12-31 ENCOUNTER — Ambulatory Visit (INDEPENDENT_AMBULATORY_CARE_PROVIDER_SITE_OTHER): Payer: Medicare Other | Admitting: Plastic Surgery

## 2018-12-31 ENCOUNTER — Other Ambulatory Visit: Payer: Self-pay

## 2018-12-31 VITALS — BP 125/67 | HR 82 | Temp 98.7°F | Ht 66.5 in | Wt 164.0 lb

## 2018-12-31 DIAGNOSIS — N651 Disproportion of reconstructed breast: Secondary | ICD-10-CM

## 2018-12-31 NOTE — Progress Notes (Signed)
The patient is a 68 year old female here for follow-up after going for her left breast reconstruction.  Overall she is very happy.  She is sore as we would expect.  She has bruising of the abdomen as expected.  No sign of infection. Continue binder and spanx.  Be sure to walk and drink a lot of water.  Call with any concerns.  Follow up in 2 weeks.

## 2019-01-14 ENCOUNTER — Other Ambulatory Visit: Payer: Self-pay

## 2019-01-14 ENCOUNTER — Ambulatory Visit (INDEPENDENT_AMBULATORY_CARE_PROVIDER_SITE_OTHER): Payer: Medicare Other | Admitting: Plastic Surgery

## 2019-01-14 ENCOUNTER — Encounter: Payer: Self-pay | Admitting: Plastic Surgery

## 2019-01-14 VITALS — BP 151/75 | HR 96 | Temp 97.3°F | Ht 66.5 in

## 2019-01-14 DIAGNOSIS — N651 Disproportion of reconstructed breast: Secondary | ICD-10-CM

## 2019-01-14 NOTE — Progress Notes (Signed)
The patient is a 68 year old female here for follow-up after surgery.  She underwent lipoma filling of the left breast for improved symmetry.  Her abdomen is doing very well.  The bruising has resolved.  She still has a little bit of swelling.  The breast area looks very good with great improvement in the contour.  There is no sign of infection.  All incision sites are healing nicely.  She is very pleased with the results.  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  We did the NDI to help minimize these.  I would like to see her back in 3 months.  She is to call if she has any questions or concerns.

## 2019-04-02 NOTE — Progress Notes (Signed)
[facility]  [address], [address] [place], [place] [address] Phone: [phone]  Fax: [phone]   Clinic Day:  04/04/2019  Referring physician: Marinda Elk, MD  Chief Complaint: Mariah Hogan is a 69 y.o. female with clinical stage IA left breast canceron Femara who is seen for a 4 month assessment.   HPI: The patient was last seen in the medical oncology clinic on 12/06/2018. At that time, she remained fatigued. Exam was unremarkable. CBC was normal. Creatinine was 1.08. Calcium was 8.7. CA 27.29 was 18.4.  She continued on Femara.  She was scheduled for reconstruction surgery on 12/23/2018.    The patient was admitted to [facility] on 12/23/2018 - 12/24/2018. She underwent left breast lipofilling for symmetry by Dr. Marla Roe.  Her hospitalization was notable for a rash secondary to ciprofloxacin.  During the interim, she felt "pretty good". She notes feeling tired. She is sleeping more during the day. She reports her acid reflux is worse than usual. She has loose stool. Her finger does not get stuck like it used to but it remains stiff.  She is happy with results from her surgery with Dr. Marla Roe. The patient denies any breast concerns. Patient examines her breast regularly. She continues to take calcium and vitamin D but not on a regular basis.    She is interested in getting the COVID-19 vaccine.    Past Medical History:  Diagnosis Date  . Anemia   . Arthritis   . Asthma    H/O YEARS AGO-NOW HAS COUGHING SPELLS WHICH IS WHY SHE HAS HER ALBUTEROL INHALER  . BRCA gene mutation negative 08/12/2017   Variant of unknown significance at [facility] only abnormality.  . Breast cancer ([place]) 08/04/2017   1.8 cm ER 90%, PR 20%, HER-2/neu negative invasive mammary carcinoma of the left upper outer quadrant.  MammaPrint: High risk.  . Cancer ([place])    [phone] basal cell carcinoma  . Complication of anesthesia    HARD TIME WAKING UP  . Dysrhythmia     TACHYCARDIA  . GERD (gastroesophageal reflux disease)   . History of hiatal hernia    SMALL  . Hyperlipidemia   . Hypertension    PT STATES IN 01-18-18 PREOP PHONE INTERVIEW THAT HER BP HAS BEEN ELEVATED SINCE STARTING ON VALSARTAN- PCP SWITCHED PT BACK TO MAXIDE ON 01-18-18 AND PT STATES THAT SHE HAS LOST 6-10 POUNDS OF FLUID SINCE RESTARTING MAXIDE.    Marland Kitchen Personal history of chemotherapy   . PONV (postoperative nausea and vomiting)     Past Surgical History:  Procedure Laterality Date  . ABDOMINAL HYSTERECTOMY  1998  . bladder tack  [id number]  . BREAST BIOPSY Left 1990  . BREAST BIOPSY Left 2018   core bx- neg  . 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  Marland Kitchen BREAST BIOPSY Right 08/2017   MRI biopsy, FIBROCYSTIC CHANGE AND USUAL DUCTAL HYPERPLASIA,, clip placed  . BREAST CYST EXCISION  x3  . BREAST EXCISIONAL BIOPSY Bilateral   . BREAST RECONSTRUCTION WITH PLACEMENT OF TISSUE EXPANDER AND FLEX HD (ACELLULAR HYDRATED DERMIS) Left 01/27/2018   Procedure: BREAST RECONSTRUCTION WITH PLACEMENT OF TISSUE EXPANDER AND FLEX HD (ACELLULAR HYDRATED DERMIS);  Surgeon: Wallace Going, DO;  Location: [facility] ORS;  Service: Plastics;  Laterality: Left;  . BREAST SURGERY Left 02/13/14   Fibrocystic changes, pseudo-angiomatous stromal hyperplasia. No atypia or malignancy.  . CARPAL TUNNEL RELEASE  x2  . CHOLECYSTECTOMY  2008  .  COLONOSCOPY WITH PROPOFOL N/A 01/12/2015   Procedure: COLONOSCOPY WITH PROPOFOL;  Surgeon: Manya Silvas, MD;  Location: [facility] ENDOSCOPY;  Service: Endoscopy;  Laterality: N/A;  . ESOPHAGOGASTRODUODENOSCOPY (EGD) WITH PROPOFOL N/A 01/12/2015   Procedure: ESOPHAGOGASTRODUODENOSCOPY (EGD) WITH PROPOFOL;  Surgeon: Manya Silvas, MD;  Location: [facility] ENDOSCOPY;  Service: Endoscopy;  Laterality: N/A;  . LIPOSUCTION WITH LIPOFILLING Left 12/23/2018   Procedure: LIPOSUCTION WITH LIPOFILLING FROM ABDOMEN TO LEFT BREAST;  Surgeon:  Wallace Going, DO;  Location: [facility];  Service: Plastics;  Laterality: Left;  90 min, please  . MASTECTOMY Left 2019   [place]  . MASTECTOMY W/ SENTINEL NODE BIOPSY Left 01/27/2018   ypT1c ypN0; ER 90%, PR 20%, HER-2/neu not overexpressed.  MASTECTOMY WITH SENTINEL LYMPH NODE BIOPSY;  Surgeon: Robert Bellow, MD;  Location: [facility] ORS;  Service: General;  Laterality: Left;  . MINOR BREAST BIOPSY Right 09/02/2017   Radiology perform procedure, fibrocystic changes right retroareolar area.  Marland Kitchen NASAL RECONSTRUCTION  1983  . OOPHORECTOMY    . PORTACATH PLACEMENT N/A 10/21/2017   Procedure: INSERTION PORT-A-CATH;  Surgeon: Robert Bellow, MD;  Location: [facility] ORS;  Service: General;  Laterality: N/A;  . REMOVAL OF TISSUE EXPANDER AND PLACEMENT OF IMPLANT Left 05/13/2018   Procedure: REMOVAL OF TISSUE EXPANDER AND PLACEMENT OF IMPLANT;  Surgeon: Wallace Going, DO;  Location: [facility] ORS;  Service: Plastics;  Laterality: Left;  total surgery time should be 3 hours, per provider  . SAVORY DILATION N/A 01/12/2015   Procedure: SAVORY DILATION;  Surgeon: Manya Silvas, MD;  Location: [facility] ENDOSCOPY;  Service: Endoscopy;  Laterality: N/A;  . TONSILLECTOMY AND ADENOIDECTOMY  1956    Family History  Problem Relation Age of Onset  . Breast cancer Sister 14       currently 35  . Breast cancer Paternal Grandmother 63       bilateral; deceased 70s   . Breast cancer Cousin 39       daughter of an unaffected maternal aunt  . Breast cancer Cousin 24       kidney cancer also; daughter of an unaffected maternal aunt  . Other Mother        TAH/BSO prior to menopause  . Prostate cancer Paternal Grandfather        age at dx unknown  . Breast cancer Maternal Aunt        age at dx unknown  . Breast cancer Cousin 42       daughter of an unaffected maternal aunt    Social History:  reports that she has never smoked. She has never used smokeless tobacco. She reports that she does  not drink alcohol or use drugs. Her daughter, [name], is a gastroenterology PA. She is a retired Customer service manager for 35 years. Patient denies known exposures to radiation on toxins. She has a grandchild on the way. The patient is alone today.  Allergies:  Allergies  Allergen Reactions  . Penicillin G Shortness Of Breath, Swelling, Rash and Other (See Comments)    Has patient had a PCN reaction causing immediate rash, facial/tongue/throat swelling, SOB or lightheadedness with hypotension: Yes Has patient had a PCN reaction causing severe rash involving mucus membranes or skin necrosis: No Has patient had a PCN reaction that required hospitalization: No Has patient had a PCN reaction occurring within the last 10 years: No If all of the above answers are "NO", then may proceed with Cephalosporin use.  [name] [Docetaxel] Shortness  Of Breath, Palpitations and Other (See Comments)    Back pain, Back Spasms  . Ciprofloxacin Rash  . Other Other (See Comments)    Dodecyl gallate: Positive patch test  Elta MD UV Clear SPF 46: Positive patch test  fragrance dodocylgallate dodocylgallate  . Parabens Other (See Comments)    Positive patch test   . Shellac Other (See Comments)    Positive patch test   . Fluocinonide Rash  . Latex Rash  . Tape Dermatitis  . Triamcinolone Rash    Current Medications: Current Outpatient Medications  Medication Sig Dispense Refill  . atenolol (TENORMIN) 50 MG tablet Take 50 mg by mouth every morning.     . cetirizine (ZYRTEC) 10 MG tablet Take 10 mg by mouth daily before lunch.     . citalopram (CELEXA) 20 MG tablet Take 20 mg by mouth at bedtime.     Marland Kitchen desoximetasone (TOPICORT) 0.25 % cream Apply to aa's rash BID    . diazepam (VALIUM) 2 MG tablet Take 1 tablet (2 mg total) by mouth every 6 (six) hours as needed for anxiety. 30 tablet 0  . letrozole (FEMARA) 2.5 MG tablet Take 1 tablet (2.5 mg total) by mouth daily. Future refill will be AFTER MD visit and  lab check. 90 tablet 3  . pantoprazole (PROTONIX) 40 MG tablet Take 40 mg by mouth 2 (two) times daily as needed (heartburn).     Marland Kitchen PREMARIN vaginal cream Place 1 Applicatorful vaginally 2 (two) times a week.     . triamterene-hydrochlorothiazide (MAXZIDE-25) 37.5-25 MG tablet Take 1 tablet by mouth every morning. 1/2 tab    . valACYclovir (VALTREX) 1000 MG tablet Take 1,000 mg by mouth See admin instructions. Take 1000 mg in the morning , may take a second 1000 mg dose as needed for fever blisters     . albuterol (PROVENTIL HFA;VENTOLIN HFA) 108 (90 Base) MCG/ACT inhaler Inhale 2 puffs into the lungs every 6 (six) hours as needed for wheezing or shortness of breath. (Patient not taking: Reported on 04/04/2019) 1 Inhaler 1   No current facility-administered medications for this visit.    Review of Systems  Constitutional: Negative for chills, diaphoresis, fever, malaise/fatigue and weight loss (up 3 pounds since 12/06/2018).       Feels "pretty good".  Sleeps more.  HENT: Negative.  Negative for congestion, ear pain, hearing loss, nosebleeds, sinus pain, sore throat and tinnitus.   Eyes: Negative.  Negative for blurred vision, double vision and photophobia.  Respiratory: Negative.  Negative for cough, hemoptysis, sputum production, shortness of breath and wheezing.   Cardiovascular: Negative.  Negative for chest pain, palpitations, orthopnea, leg swelling and PND.  Gastrointestinal: Positive for heartburn. Negative for abdominal pain, blood in stool, constipation, diarrhea (loose stools), melena, nausea and vomiting.  Genitourinary: Negative.  Negative for dysuria, frequency, hematuria and urgency.  Musculoskeletal: Negative.  Negative for back pain, falls, joint pain, myalgias and neck pain.       Left 4th digit a little stiff.  Skin: Negative.  Negative for itching and rash.  Neurological: Negative.  Negative for dizziness, tremors, sensory change, speech change, focal weakness, weakness and  headaches.  Endo/Heme/Allergies: Negative.  Does not bruise/bleed easily.  Psychiatric/Behavioral: Negative.  Negative for depression and memory loss. The patient is not nervous/anxious and does not have insomnia.   All other systems reviewed and are negative.  Performance status (ECOG): 0-1  Vitals Blood pressure 121/65, pulse 76, temperature 99 F (37.2 C), temperature source  Tympanic, resp. rate 16, weight 166 lb 3.6 oz (75.4 kg), SpO2 97 %.   Physical Exam  Constitutional: She is oriented to person, place, and time. She appears well-developed and well-nourished. No distress.  HENT:  Head: Normocephalic and atraumatic.  Mouth/Throat: No oropharyngeal exudate.  Short gray hair.  Mask.  Eyes: Pupils are equal, round, and reactive to light. Conjunctivae and EOM are normal. No scleral icterus.  Blue eyes.  Neck: No JVD present.  Cardiovascular: Normal rate, regular rhythm and normal heart sounds.  No murmur heard. Pulmonary/Chest: Effort normal and breath sounds normal. No respiratory distress. She has no wheezes. She has no rales. Left breast exhibits tenderness.  s/p left mastectomy with saline implant.  Abdominal: Soft. Bowel sounds are normal. She exhibits no mass. There is no abdominal tenderness. There is no rebound and no guarding.  Musculoskeletal:        General: No edema. Normal range of motion.     Cervical back: Normal range of motion and neck supple.  Lymphadenopathy:       Head (right side): No preauricular, no posterior auricular and no occipital adenopathy present.       Head (left side): No preauricular, no posterior auricular and no occipital adenopathy present.    She has no cervical adenopathy.    She has no axillary adenopathy.       Right: No inguinal and no supraclavicular adenopathy present.       Left: No inguinal and no supraclavicular adenopathy present.  Neurological: She is alert and oriented to person, place, and time. She has normal reflexes.  Skin:  Skin is warm and dry. No rash noted. She is not diaphoretic. No erythema. No pallor.  Psychiatric: She has a normal mood and affect. Her behavior is normal. Judgment and thought content normal.  Nursing note and vitals reviewed.   Appointment on 04/04/2019  Component Date Value Ref Range Status  . Sodium 04/04/2019 136  135 - 145 mmol/L Final  . Potassium 04/04/2019 3.7  3.5 - 5.1 mmol/L Final  . Chloride 04/04/2019 101  98 - 111 mmol/L Final  . CO2 04/04/2019 25  22 - 32 mmol/L Final  . Glucose, Bld 04/04/2019 137* 70 - 99 mg/dL Final  . BUN 04/04/2019 13  8 - 23 mg/dL Final  . Creatinine, Ser 04/04/2019 0.98  0.44 - 1.00 mg/dL Final  . Calcium 04/04/2019 8.8* 8.9 - 10.3 mg/dL Final  . Total Protein 04/04/2019 6.5  6.5 - 8.1 g/dL Final  . Albumin 04/04/2019 3.8  3.5 - 5.0 g/dL Final  . AST 04/04/2019 18  15 - 41 U/L Final  . ALT 04/04/2019 18  0 - 44 U/L Final  . Alkaline Phosphatase 04/04/2019 119  38 - 126 U/L Final  . Total Bilirubin 04/04/2019 0.8  0.3 - 1.2 mg/dL Final  . GFR calc non Af Amer 04/04/2019 58* >60 mL/min Final  . GFR calc Af Amer 04/04/2019 >60  >60 mL/min Final  . Anion gap 04/04/2019 10  5 - 15 Final   Performed at [facility] [place], [address], [place],  [address]  . WBC 04/04/2019 4.4  4.0 - 10.5 K/uL Final  . RBC 04/04/2019 4.38  3.87 - 5.11 MIL/uL Final  . Hemoglobin 04/04/2019 13.6  12.0 - 15.0 g/dL Final  . HCT 04/04/2019 39.9  36.0 - 46.0 % Final  . MCV 04/04/2019 91.1  80.0 - 100.0 fL Final  . MCH 04/04/2019 31.1  26.0 - 34.0  pg Final  . MCHC 04/04/2019 34.1  30.0 - 36.0 g/dL Final  . RDW 04/04/2019 12.0  11.5 - 15.5 % Final  . Platelets 04/04/2019 189  150 - 400 K/uL Final  . nRBC 04/04/2019 0.0  0.0 - 0.2 % Final  . Neutrophils Relative % 04/04/2019 62  % Final  . Neutro Abs 04/04/2019 2.8  1.7 - 7.7 K/uL Final  . Lymphocytes Relative 04/04/2019 25  % Final  . Lymphs Abs 04/04/2019 1.1  0.7 - 4.0 K/uL Final  . Monocytes  Relative 04/04/2019 6  % Final  . Monocytes Absolute 04/04/2019 0.3  0.1 - 1.0 K/uL Final  . Eosinophils Relative 04/04/2019 6  % Final  . Eosinophils Absolute 04/04/2019 0.2  0.0 - 0.5 K/uL Final  . Basophils Relative 04/04/2019 1  % Final  . Basophils Absolute 04/04/2019 0.0  0.0 - 0.1 K/uL Final  . Immature Granulocytes 04/04/2019 0  % Final  . Abs Immature Granulocytes 04/04/2019 0.00  0.00 - 0.07 K/uL Final   Performed at [facility], [address], [place], [place] [address]    Assessment:  Mariah Hogan is a 69 y.o. female with clinical stage IA left breast cancers/p neoadjuvant chemotherapy followed by mastectomy,sentinel lymph node biopsy, and breast reconstruction with placement of a tissue expander on 01/27/2018. Pathologyrevealed a 1.1 cm grade II invasive mammary carcinoma of no special type. There was no lymphovascular invasion. 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 Margins were negative. There were 2 negative sentinel lymph nodes. Pathologic stagewas ypT1c ypN0.  Original breast biopsy on 08/04/2017 revealed at least grade II invasive ductal carcinoma with DCIS. Tumor was ER + (95%), PR+ (20%), and Her 2 neu - by FISH. Ki67 was 80%.   MammaPrintwas high risk luminal-type B with a 10 year risk of recurrence untreated of 29% and 94.6% of distant metastasis free interval with chemotherapy + hormonal therapy.  She receivedneoadjuvant Femara(08/12/2017 - 08/31/2017)secondary to planned delay in surgery (mastectomy with immediate reconstruction). Femara was discontinued after high risk Mammaprint returned. She received 1 cycle of neoadjuvant Taxotere and Cytoxan(09/03/2017). Cycle #2 on 10/01/2017 was truncated secondary to a Taxotere infusion reaction (back pain, SOB, shaking). She received 3 cycles ofneoadjuvant AC (10/27/2017 - 12/08/2017) with Udencya support.  She underwent left breast lipofilling for symmetry on 12/23/2018.  She began [facility]  02/09/2018. She is tolerating it well.  CA27.29 has been followed: 17.3 on 08/06/2017, 17.0 on 06/09/2018, 25.2 on 08/31/2018, 18.4 on 12/06/2018, and 21.4 on 04/04/2019.  Bilateral breast MRIon 08/20/2017 revealed a 2.4 cm enhancing irregular mass in the outer left breast. There were several prominent lymph nodes with borderline thicked cortices between 3-4 mm, but normal morphology. There was a 5 mm enhancing mass in the anterior, retroareolar region of the right breast. RIGHT breast retroareolar biopsyon 09/02/2017 revealed no evidence of malignancy. There was fibrocystic change and usual ductal hyperplasia.  Diagnostic right mammogram and ultrasound on 10/18/2018 revealed no suspicious mass, malignant type microcalcifications or distortion detected in the right breast. Spot tangential view of the area of clinical concern in the right breast showed normal fibroglandular tissue. There was no solid or cystic mass, abnormal shadowing or distortion visualized.  Bilateral breast MRI on 11/09/2018 revealed left mastectomy with implant reconstruction.  There was no suspicious enhancement.  Right breast was negative.  She underwent left breast lipofilling for symmetry on 12/23/2018.    Chest CTon 09/23/2016 revealed a 4 mm RUL subpleural nodule. Chest CTon 08/17/2017 revealed a small  4 mm RUL nodule unchanged in size.  [place] 10/14/2017 revealed an EF of 55-60%,  Bone densityon 08/17/2017 revealed osteopeniawith a T-score of -1.5 in the left femoral neck and -0.7 in the AP spine L1-L4.  She has a significant family history of breast cancer(paternal grandmother, sister, and 3 maternal cousins). 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  She was admitted to [facility] 09/11/2017 - 09/15/2017 with enteropathogenic E.Coli (EPEC)and associated acute renal failure(creatinine 1.87; CrCl 28.3 ml/min). She  was treated with intravenous azithromycin and ceftriaxone.  Symptomatically, she is doing well.  Exam is unremarkable.  Plan: 1.   Labs today: CBC with diff, CMP, CA 27.29.  2.Stage IA left breast cancer Clinically, she continues to do well. She is s/p neoadjuvant chemotherapy followed by mastectomy. Mammogram on 10/18/2018 and breast MRI on 11/09/2018 revealed no evidence of disease. She is s/p left breast lipofilling on 12/23/2018. Continue Femara. 3. Osteopenia She remains on calcium and vitamin D.  Schedule bone density on 08/18/2019.             Review plan for Prolia if bone density declines. 4.   RTC in 4 months for MD assessment and labs (CBC with diff, CMP, CA 27.29).  I discussed the assessment and treatment plan with the patient.  The patient was provided an opportunity to ask questions and all were answered.  The patient agreed with the plan and demonstrated an understanding of the instructions.  The patient was advised to call back if the symptoms worsen or if the condition fails to improve as anticipated.   Lequita Asal, MD, PhD    04/04/2019, 11:41 AM  I, Selena Batten, am acting as scribe for [employer]. Mike Gip, MD, PhD.  I, Leiana Rund C. Mike Gip, MD, have reviewed the above documentation for accuracy and completeness, and I agree with the above.

## 2019-04-04 ENCOUNTER — Inpatient Hospital Stay: Payer: Medicare Other | Attending: Hematology and Oncology | Admitting: Hematology and Oncology

## 2019-04-04 ENCOUNTER — Other Ambulatory Visit: Payer: Self-pay

## 2019-04-04 ENCOUNTER — Inpatient Hospital Stay: Payer: Medicare Other

## 2019-04-04 ENCOUNTER — Encounter: Payer: Self-pay | Admitting: Hematology and Oncology

## 2019-04-04 VITALS — BP 121/65 | HR 76 | Temp 99.0°F | Resp 16 | Wt 166.2 lb

## 2019-04-04 DIAGNOSIS — C50311 Malignant neoplasm of lower-inner quadrant of right female breast: Secondary | ICD-10-CM | POA: Insufficient documentation

## 2019-04-04 DIAGNOSIS — C50912 Malignant neoplasm of unspecified site of left female breast: Secondary | ICD-10-CM | POA: Diagnosis not present

## 2019-04-04 DIAGNOSIS — M85852 Other specified disorders of bone density and structure, left thigh: Secondary | ICD-10-CM

## 2019-04-04 LAB — COMPREHENSIVE METABOLIC PANEL
ALT: 18 U/L (ref 0–44)
AST: 18 U/L (ref 15–41)
Albumin: 3.8 g/dL (ref 3.5–5.0)
Alkaline Phosphatase: 119 U/L (ref 38–126)
Anion gap: 10 (ref 5–15)
BUN: 13 mg/dL (ref 8–23)
CO2: 25 mmol/L (ref 22–32)
Calcium: 8.8 mg/dL — ABNORMAL LOW (ref 8.9–10.3)
Chloride: 101 mmol/L (ref 98–111)
Creatinine, Ser: 0.98 mg/dL (ref 0.44–1.00)
GFR calc Af Amer: >60 mL/min (ref >60)
GFR calc non Af Amer: 58 mL/min — ABNORMAL LOW (ref >60)
Glucose, Bld: 137 mg/dL — ABNORMAL HIGH (ref 70–99)
Potassium: 3.7 mmol/L (ref 3.5–5.1)
Sodium: 136 mmol/L (ref 135–145)
Total Bilirubin: 0.8 mg/dL (ref 0.3–1.2)
Total Protein: 6.5 g/dL (ref 6.5–8.1)

## 2019-04-04 LAB — CBC WITH DIFFERENTIAL/PLATELET
Abs Immature Granulocytes: 0 10*3/uL (ref 0.00–0.07)
Basophils Absolute: 0 10*3/uL (ref 0.0–0.1)
Basophils Relative: 1 %
Eosinophils Absolute: 0.2 10*3/uL (ref 0.0–0.5)
Eosinophils Relative: 6 %
HCT: 39.9 % (ref 36.0–46.0)
Hemoglobin: 13.6 g/dL (ref 12.0–15.0)
Immature Granulocytes: 0 %
Lymphocytes Relative: 25 %
Lymphs Abs: 1.1 10*3/uL (ref 0.7–4.0)
MCH: 31.1 pg (ref 26.0–34.0)
MCHC: 34.1 g/dL (ref 30.0–36.0)
MCV: 91.1 fL (ref 80.0–100.0)
Monocytes Absolute: 0.3 10*3/uL (ref 0.1–1.0)
Monocytes Relative: 6 %
Neutro Abs: 2.8 10*3/uL (ref 1.7–7.7)
Neutrophils Relative %: 62 %
Platelets: 189 10*3/uL (ref 150–400)
RBC: 4.38 MIL/uL (ref 3.87–5.11)
RDW: 12 % (ref 11.5–15.5)
WBC: 4.4 10*3/uL (ref 4.0–10.5)
nRBC: 0 % (ref 0.0–0.2)

## 2019-04-04 MED ORDER — LETROZOLE 2.5 MG PO TABS: 2.5000 mg | ORAL_TABLET | Freq: Every day | ORAL | 3 refills | Status: DC

## 2019-04-04 NOTE — Progress Notes (Signed)
Patient here for follow up. Reports she still does not have much energy and is sleeping more than usual. Denies any other concerns.

## 2019-04-06 LAB — CANCER ANTIGEN 27.29: CA 27.29: 21.4 U/mL (ref 0.0–38.6)

## 2019-04-14 ENCOUNTER — Telehealth: Payer: Self-pay | Admitting: Plastic Surgery

## 2019-04-14 NOTE — Telephone Encounter (Signed)

## 2019-04-15 ENCOUNTER — Encounter: Payer: Self-pay | Admitting: Plastic Surgery

## 2019-04-15 ENCOUNTER — Ambulatory Visit (INDEPENDENT_AMBULATORY_CARE_PROVIDER_SITE_OTHER): Payer: Medicare Other | Admitting: Plastic Surgery

## 2019-04-15 ENCOUNTER — Other Ambulatory Visit: Payer: Self-pay

## 2019-04-15 VITALS — BP 129/74 | HR 82 | Temp 97.7°F | Ht 66.0 in | Wt 168.0 lb

## 2019-04-15 DIAGNOSIS — Z9889 Other specified postprocedural states: Secondary | ICD-10-CM

## 2019-04-15 DIAGNOSIS — Z9012 Acquired absence of left breast and nipple: Secondary | ICD-10-CM

## 2019-04-15 NOTE — Progress Notes (Signed)
   Subjective:    Patient ID: Mariah Hogan, female    DOB: 1950/07/07, 69 y.o.   MRN: [id number]  The patient is a 69 year old white female here for follow-up on her breast reconstruction.  She had left breast cancer and underwent a mastectomy.  She has completed her reconstruction with implant in place.  She is not interested in nipple areola reconstruction at this time.  She does not think she will be in the future either.  She has noticed a little bit of tenderness at the lateral and superior aspect of the left breast.  She has great range of motion.  She has not been doing much or any massage to this time.  The implant feels nice and soft.  The incision has healed very nicely and there is no sign of seroma or infection.     Review of Systems  Constitutional: Negative.   HENT: Negative.   Eyes: Negative.   Respiratory: Negative.   Gastrointestinal: Negative.   Endocrine: Negative.   Genitourinary: Negative.   Musculoskeletal: Negative.   Skin: Negative.        Objective:   Physical Exam Vitals and nursing note reviewed.  Constitutional:      Appearance: Normal appearance.  HENT:     Head: Normocephalic and atraumatic.  Eyes:     Extraocular Movements: Extraocular movements intact.  Cardiovascular:     Rate and Rhythm: Normal rate.  Pulmonary:     Effort: Pulmonary effort is normal.  Abdominal:     General: Abdomen is flat.  Neurological:     General: No focal deficit present.     Mental Status: She is alert.  Psychiatric:        Mood and Affect: Mood normal.        Thought Content: Thought content normal.        Assessment & Plan:     ICD-10-CM   1. S/P mastectomy, left  Z90.12   2. Acquired absence of left breast  Z90.12   3. S/P breast reconstruction, left  Z98.890     Follow up in one year.  Continue implant massage to breasts.  Call with any changes and we can do PT.   The [place] was signed into law in 2016 which includes the topic of  electronic health records.  This provides immediate access to information in MyChart.  This includes consultation notes, operative notes, office notes, lab results and pathology reports.  If you have any questions about what you read please let us know at your next visit or call us at the office.  We are right here with you.

## 2019-07-07 ENCOUNTER — Other Ambulatory Visit: Payer: Self-pay | Admitting: Orthopedic Surgery

## 2019-07-07 DIAGNOSIS — M1712 Unilateral primary osteoarthritis, left knee: Secondary | ICD-10-CM

## 2019-07-07 DIAGNOSIS — G8929 Other chronic pain: Secondary | ICD-10-CM

## 2019-07-07 DIAGNOSIS — M2392 Unspecified internal derangement of left knee: Secondary | ICD-10-CM

## 2019-07-07 DIAGNOSIS — M25362 Other instability, left knee: Secondary | ICD-10-CM

## 2019-07-07 DIAGNOSIS — M25562 Pain in left knee: Secondary | ICD-10-CM

## 2019-07-12 ENCOUNTER — Ambulatory Visit
Admission: RE | Admit: 2019-07-12 | Discharge: 2019-07-12 | Disposition: A | Discharge status: Home / Self Care | Payer: Medicare Other | Source: Ambulatory Visit | Attending: Orthopedic Surgery | Admitting: Orthopedic Surgery

## 2019-07-12 ENCOUNTER — Other Ambulatory Visit: Payer: Self-pay

## 2019-07-12 DIAGNOSIS — M25562 Pain in left knee: Secondary | ICD-10-CM | POA: Diagnosis present

## 2019-07-12 DIAGNOSIS — M1712 Unilateral primary osteoarthritis, left knee: Secondary | ICD-10-CM | POA: Diagnosis present

## 2019-07-12 DIAGNOSIS — M2392 Unspecified internal derangement of left knee: Secondary | ICD-10-CM | POA: Insufficient documentation

## 2019-07-12 DIAGNOSIS — G8929 Other chronic pain: Secondary | ICD-10-CM | POA: Diagnosis present

## 2019-07-12 DIAGNOSIS — M25362 Other instability, left knee: Secondary | ICD-10-CM | POA: Insufficient documentation

## 2019-07-20 ENCOUNTER — Ambulatory Visit: Payer: Medicare Other

## 2019-08-02 ENCOUNTER — Inpatient Hospital Stay: Payer: Medicare Other | Admitting: Hematology and Oncology

## 2019-08-02 ENCOUNTER — Other Ambulatory Visit: Payer: Medicare Other

## 2019-08-02 NOTE — Progress Notes (Deleted)
[facility]  [address] [place], [place] [address] Phone: [phone]  Fax: [phone]   Clinic Day:  08/02/2019  Referring physician: Marinda Elk, MD  Chief Complaint: Mariah Hogan is a 69 y.o. female with clinical stage IA left breast canceron [name] is seen for a 4 month assessment.   HPI: The patient was last seen in the medical oncology clinic on 04/04/2019. At that time, she was doing well. Exam was unremarkable. CBC was normal. Calcium was 8.8. CA 27.29 was 21.4. She continued on Femara.   She saw Dr. Marla Roe on 04/15/2019. She was not interested in nipple areola reconstruction. She noticed a little bit of tenderness at the lateral and superior aspect of the left breast. She had great range of motion.  She had not been doing much or any massages.  The implant felt soft. The incision had healed very nicely and there was no sign of seroma or infection. Patient will follow up in 1 year.   She has an initial consultation with Hampton Abbot, PA in the orthopedics clinic on 07/07/2019. She had left knee pain and swelling. She had early arthritis. Her activity was decreased due to pain. She felt unstable with walking. MRI was ordered. She was advised to remain on Mobic, use a knee brace and ice.   Left knee MRI on 07/12/2019 revealed small focal tear of the posterior horn of the medial meniscus at the posteromedial corner. There was extensive full-thickness cartilage loss at the apex of the patella. There was focal tiny areas of full-thickness cartilage loss in the medial and lateral compartments.  During the interim, ***   Past Medical History:  Diagnosis Date  . Anemia   . Arthritis   . Asthma    H/O YEARS AGO-NOW HAS COUGHING SPELLS WHICH IS WHY SHE HAS HER ALBUTEROL INHALER  . BRCA gene mutation negative 08/12/2017   Variant of unknown significance at [facility] only abnormality.  . Breast cancer ([place]) 08/04/2017   1.8 cm ER  90%, PR 20%, HER-2/neu negative invasive mammary carcinoma of the left upper outer quadrant.  MammaPrint: High risk.  . Cancer ([place])    [phone] basal cell carcinoma  . Complication of anesthesia    HARD TIME WAKING UP  . Dysrhythmia    TACHYCARDIA  . GERD (gastroesophageal reflux disease)   . History of hiatal hernia    SMALL  . Hyperlipidemia   . Hypertension    PT STATES IN 01-18-18 PREOP PHONE INTERVIEW THAT HER BP HAS BEEN ELEVATED SINCE STARTING ON VALSARTAN- PCP SWITCHED PT BACK TO MAXIDE ON 01-18-18 AND PT STATES THAT SHE HAS LOST 6-10 POUNDS OF FLUID SINCE RESTARTING MAXIDE.    Marland Kitchen Personal history of chemotherapy   . PONV (postoperative nausea and vomiting)     Past Surgical History:  Procedure Laterality Date  . ABDOMINAL HYSTERECTOMY  1998  . bladder tack  [id number]  . BREAST BIOPSY Left 1990  . BREAST BIOPSY Left 2018   core bx- neg  . 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  Marland Kitchen BREAST BIOPSY Right 08/2017   MRI biopsy, FIBROCYSTIC CHANGE AND USUAL DUCTAL HYPERPLASIA,, clip placed  . BREAST CYST EXCISION  x3  . BREAST EXCISIONAL BIOPSY Bilateral   . BREAST RECONSTRUCTION WITH PLACEMENT OF TISSUE EXPANDER AND FLEX HD (ACELLULAR HYDRATED DERMIS) Left 01/27/2018   Procedure: BREAST RECONSTRUCTION WITH PLACEMENT OF TISSUE EXPANDER AND FLEX HD (ACELLULAR HYDRATED DERMIS);  Surgeon: Wallace Going, DO;  Location: [facility] ORS;  Service: Plastics;  Laterality: Left;  . BREAST SURGERY Left 02/13/14   Fibrocystic changes, pseudo-angiomatous stromal hyperplasia. No atypia or malignancy.  . CARPAL TUNNEL RELEASE  x2  . CHOLECYSTECTOMY  2008  . COLONOSCOPY WITH PROPOFOL N/A 01/12/2015   Procedure: COLONOSCOPY WITH PROPOFOL;  Surgeon: Manya Silvas, MD;  Location: [facility] ENDOSCOPY;  Service: Endoscopy;  Laterality: N/A;  . ESOPHAGOGASTRODUODENOSCOPY (EGD) WITH PROPOFOL N/A 01/12/2015   Procedure: ESOPHAGOGASTRODUODENOSCOPY (EGD) WITH  PROPOFOL;  Surgeon: Manya Silvas, MD;  Location: [facility] END[facility];  Service: Endoscopy;  Laterality: N/A;  . LIPOSUCTION WITH LIPOFILLING Left 12/23/2018   Procedure: LIPOSUCTION WITH LIPOFILLING FROM ABDOMEN TO LEFT BREAST;  Surgeon: Wallace Going, DO;  Location: [facility];  Service: Plastics;  Laterality: Left;  90 min, please  . MASTECTOMY Left 2019   [place]  . MASTECTOMY W/ SENTINEL NODE BIOPSY Left 01/27/2018   ypT1c ypN0; ER 90%, PR 20%, HER-2/neu not overexpressed.  MASTECTOMY WITH SENTINEL LYMPH NODE BIOPSY;  Surgeon: Robert Bellow, MD;  Location: [facility] ORS;  Service: General;  Laterality: Left;  . MINOR BREAST BIOPSY Right 09/02/2017   Radiology perform procedure, fibrocystic changes right retroareolar area.  Marland Kitchen NASAL RECONSTRUCTION  1983  . OOPHORECTOMY    . PORTACATH PLACEMENT N/A 10/21/2017   Procedure: INSERTION PORT-A-CATH;  Surgeon: Robert Bellow, MD;  Location: [facility] ORS;  Service: General;  Laterality: N/A;  . REMOVAL OF TISSUE EXPANDER AND PLACEMENT OF IMPLANT Left 05/13/2018   Procedure: REMOVAL OF TISSUE EXPANDER AND PLACEMENT OF IMPLANT;  Surgeon: Wallace Going, DO;  Location: [facility] ORS;  Service: Plastics;  Laterality: Left;  total surgery time should be 3 hours, per provider  . SAVORY DILATION N/A 01/12/2015   Procedure: SAVORY DILATION;  Surgeon: Manya Silvas, MD;  Location: [facility] ENDOSCOPY;  Service: Endoscopy;  Laterality: N/A;  . TONSILLECTOMY AND ADENOIDECTOMY  1956    Family History  Problem Relation Age of Onset  . Breast cancer Sister 72       currently 34  . Breast cancer Paternal Grandmother 48       bilateral; deceased 5s   . Breast cancer Cousin 39       daughter of an unaffected maternal aunt  . Breast cancer Cousin 23       kidney cancer also; daughter of an unaffected maternal aunt  . Other Mother        TAH/BSO prior to menopause  . Prostate cancer Paternal Grandfather        age at dx unknown  . Breast cancer  Maternal Aunt        age at dx unknown  . Breast cancer Cousin 52       daughter of an unaffected maternal aunt    Social History:  reports that she has never smoked. She has never used smokeless tobacco. She reports that she does not drink alcohol or use drugs. Her daughter, [name], is a gastroenterology PA. She is a retired Customer service manager for 35 years. Patient denies known exposures to radiation on toxins. She has a grandchild on the way. The patient is alone today.  Allergies:  Allergies  Allergen Reactions  . Penicillin G Shortness Of Breath, Swelling, Rash and Other (See Comments)    Has patient had a PCN reaction causing immediate rash, facial/tongue/throat swelling, SOB or lightheadedness with hypotension: Yes Has patient had a PCN reaction causing severe rash involving mucus membranes  or skin necrosis: No Has patient had a PCN reaction that required hospitalization: No Has patient had a PCN reaction occurring within the last 10 years: No If all of the above answers are "NO", then may proceed with Cephalosporin use.  [name] [Docetaxel] Shortness Of Breath, Palpitations and Other (See Comments)    Back pain, Back Spasms  . Ciprofloxacin Rash  . Other Other (See Comments)    Dodecyl gallate: Positive patch test  Elta MD UV Clear SPF 46: Positive patch test  fragrance dodocylgallate dodocylgallate  . Parabens Other (See Comments)    Positive patch test   . Shellac Other (See Comments)    Positive patch test   . Fluocinonide Rash  . Latex Rash  . Tape Dermatitis  . Triamcinolone Rash    Current Medications: Current Outpatient Medications  Medication Sig Dispense Refill  . albuterol (PROVENTIL HFA;VENTOLIN HFA) 108 (90 Base) MCG/ACT inhaler Inhale 2 puffs into the lungs every 6 (six) hours as needed for wheezing or shortness of breath. 1 Inhaler 1  . atenolol (TENORMIN) 50 MG tablet Take 50 mg by mouth every morning.     . cetirizine (ZYRTEC) 10 MG tablet Take 10 mg by  mouth daily before lunch.     . citalopram (CELEXA) 20 MG tablet Take 20 mg by mouth at bedtime.     Marland Kitchen desoximetasone (TOPICORT) 0.25 % cream Apply to aa's rash BID    . diazepam (VALIUM) 2 MG tablet Take 1 tablet (2 mg total) by mouth every 6 (six) hours as needed for anxiety. 30 tablet 0  . letrozole (FEMARA) 2.5 MG tablet Take 1 tablet (2.5 mg total) by mouth daily. Future refill will be AFTER MD visit and lab check. 90 tablet 3  . pantoprazole (PROTONIX) 40 MG tablet Take 40 mg by mouth 2 (two) times daily as needed (heartburn).     Marland Kitchen PREMARIN vaginal cream Place 1 Applicatorful vaginally 2 (two) times a week.     . triamterene-hydrochlorothiazide (MAXZIDE-25) 37.5-25 MG tablet Take 1 tablet by mouth every morning. 1/2 tab    . valACYclovir (VALTREX) 1000 MG tablet Take 1,000 mg by mouth See admin instructions. Take 1000 mg in the morning , may take a second 1000 mg dose as needed for fever blisters      No current facility-administered medications for this visit.    Review of Systems  Constitutional: Negative for chills, diaphoresis, fever, malaise/fatigue and weight loss (up 3 pounds since 12/06/2018).       Feels "pretty good".  Sleeps more.  HENT: Negative.  Negative for congestion, ear pain, hearing loss, nosebleeds, sinus pain, sore throat and tinnitus.   Eyes: Negative.  Negative for blurred vision, double vision and photophobia.  Respiratory: Negative.  Negative for cough, hemoptysis, sputum production, shortness of breath and wheezing.   Cardiovascular: Negative.  Negative for chest pain, palpitations, orthopnea, leg swelling and PND.  Gastrointestinal: Positive for heartburn. Negative for abdominal pain, blood in stool, constipation, diarrhea (loose stools), melena, nausea and vomiting.  Genitourinary: Negative.  Negative for dysuria, frequency, hematuria and urgency.  Musculoskeletal: Negative.  Negative for back pain, falls, joint pain, myalgias and neck pain.       Left 4th  digit a little stiff.  Skin: Negative.  Negative for itching and rash.  Neurological: Negative.  Negative for dizziness, tremors, sensory change, speech change, focal weakness, weakness and headaches.  Endo/Heme/Allergies: Negative.  Does not bruise/bleed easily.  Psychiatric/Behavioral: Negative.  Negative for depression and  memory loss. The patient is not nervous/anxious and does not have insomnia.   All other systems reviewed and are negative.  Performance status (ECOG): 0-1  Vitals There were no vitals taken for this visit.   Physical Exam  Constitutional: She is oriented to person, place, and time. She appears well-developed and well-nourished. No distress.  HENT:  Head: Normocephalic and atraumatic.  Mouth/Throat: No oropharyngeal exudate.  Short gray hair.  Mask.  Eyes: Pupils are equal, round, and reactive to light. Conjunctivae and EOM are normal. No scleral icterus.  Blue eyes.  Neck: No JVD present.  Cardiovascular: Normal rate, regular rhythm and normal heart sounds.  No murmur heard. Pulmonary/Chest: Effort normal and breath sounds normal. No respiratory distress. She has no wheezes. She has no rales. Left breast exhibits tenderness.  s/p left mastectomy with saline implant.  Abdominal: Soft. Bowel sounds are normal. She exhibits no mass. There is no abdominal tenderness. There is no rebound and no guarding.  Musculoskeletal:        General: No edema. Normal range of motion.     Cervical back: Normal range of motion and neck supple.  Lymphadenopathy:       Head (right side): No preauricular, no posterior auricular and no occipital adenopathy present.       Head (left side): No preauricular, no posterior auricular and no occipital adenopathy present.    She has no cervical adenopathy.    She has no axillary adenopathy.       Right: No supraclavicular adenopathy present.       Left: No supraclavicular adenopathy present.  Neurological: She is alert and oriented to  person, place, and time. She has normal reflexes.  Skin: Skin is warm and dry. No rash noted. She is not diaphoretic. No erythema. No pallor.  Psychiatric: She has a normal mood and affect. Her behavior is normal. Judgment and thought content normal.  Nursing note and vitals reviewed.   No visits with results within 3 Day(s) from this visit.  Latest known visit with results is:  Appointment on 04/04/2019  Component Date Value Ref Range Status  . Sodium 04/04/2019 136  135 - 145 mmol/L Final  . Potassium 04/04/2019 3.7  3.5 - 5.1 mmol/L Final  . Chloride 04/04/2019 101  98 - 111 mmol/L Final  . CO2 04/04/2019 25  22 - 32 mmol/L Final  . Glucose, Bld 04/04/2019 137* 70 - 99 mg/dL Final  . BUN 04/04/2019 13  8 - 23 mg/dL Final  . Creatinine, Ser 04/04/2019 0.98  0.44 - 1.00 mg/dL Final  . Calcium 04/04/2019 8.8* 8.9 - 10.3 mg/dL Final  . Total Protein 04/04/2019 6.5  6.5 - 8.1 g/dL Final  . Albumin 04/04/2019 3.8  3.5 - 5.0 g/dL Final  . AST 04/04/2019 18  15 - 41 U/L Final  . ALT 04/04/2019 18  0 - 44 U/L Final  . Alkaline Phosphatase 04/04/2019 119  38 - 126 U/L Final  . Total Bilirubin 04/04/2019 0.8  0.3 - 1.2 mg/dL Final  . GFR calc non Af Amer 04/04/2019 58* >60 mL/min Final  . GFR calc Af Amer 04/04/2019 >60  >60 mL/min Final  . Anion gap 04/04/2019 10  5 - 15 Final   Performed at [facility], [address], [place], [place] [address]  . WBC 04/04/2019 4.4  4.0 - 10.5 K/uL Final  . RBC 04/04/2019 4.38  3.87 - 5.11 MIL/uL Final  . Hemoglobin 04/04/2019 13.6  12.0 -  15.0 g/dL Final  . HCT 04/04/2019 39.9  36.0 - 46.0 % Final  . MCV 04/04/2019 91.1  80.0 - 100.0 fL Final  . MCH 04/04/2019 31.1  26.0 - 34.0 pg Final  . MCHC 04/04/2019 34.1  30.0 - 36.0 g/dL Final  . RDW 04/04/2019 12.0  11.5 - 15.5 % Final  . Platelets 04/04/2019 189  150 - 400 K/uL Final  . nRBC 04/04/2019 0.0  0.0 - 0.2 % Final  . Neutrophils Relative % 04/04/2019 62  % Final  . Neutro Abs  04/04/2019 2.8  1.7 - 7.7 K/uL Final  . Lymphocytes Relative 04/04/2019 25  % Final  . Lymphs Abs 04/04/2019 1.1  0.7 - 4.0 K/uL Final  . Monocytes Relative 04/04/2019 6  % Final  . Monocytes Absolute 04/04/2019 0.3  0.1 - 1.0 K/uL Final  . Eosinophils Relative 04/04/2019 6  % Final  . Eosinophils Absolute 04/04/2019 0.2  0.0 - 0.5 K/uL Final  . Basophils Relative 04/04/2019 1  % Final  . Basophils Absolute 04/04/2019 0.0  0.0 - 0.1 K/uL Final  . Immature Granulocytes 04/04/2019 0  % Final  . Abs Immature Granulocytes 04/04/2019 0.00  0.00 - 0.07 K/uL Final   Performed at [facility], [address] [place], [place] [address]  . CA 27.29 04/04/2019 21.4  0.0 - 38.6 U/mL Final   Comment: (NOTE) Siemens Centaur Immunochemiluminometric Methodology [facility]) Values obtained with different assay methods or kits cannot be used interchangeably. Results cannot be interpreted as absolute evidence of the presence or absence of malignant disease. Performed At: [facility] [place], [place] [id number] Rush Farmer MD IW:[id number]     Assessment:  Mariah Hogan is a 69 y.o. female with clinical stage IA left breast cancers/p neoadjuvant chemotherapy followed by mastectomy,sentinel lymph node biopsy, and breast reconstruction with placement of a tissue expander on 01/27/2018. Pathologyrevealed a 1.1 cm grade II invasive mammary carcinoma of no special type. There was no lymphovascular invasion. 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 Margins were negative. There were 2 negative sentinel lymph nodes. Pathologic stagewas ypT1c ypN0.  Original breast biopsy on 08/04/2017 revealed at least grade II invasive ductal carcinoma with DCIS. Tumor was ER + (95%), PR+ (20%), and Her 2 neu - by FISH. Ki67 was 80%.   MammaPrintwas high risk luminal-type B with a 10 year risk of recurrence untreated of 29% and 94.6% of distant metastasis free interval with chemotherapy +  hormonal therapy.  She receivedneoadjuvant Femara(08/12/2017 - 08/31/2017)secondary to planned delay in surgery (mastectomy with immediate reconstruction). Femara was discontinued after high risk Mammaprint returned. She received 1 cycle of neoadjuvant Taxotere and Cytoxan(09/03/2017). Cycle #2 on 10/01/2017 was truncated secondary to a Taxotere infusion reaction (back pain, SOB, shaking). She received 3 cycles ofneoadjuvant AC (10/27/2017 - 12/08/2017) with Udencya support.  She underwent left breast lipofilling for symmetry on 12/23/2018.  She began [facility] 02/09/2018. She is tolerating it well.  CA27.29 has been followed: 17.3 on 08/06/2017, 17.0 on 06/09/2018, 25.2 on 08/31/2018, 18.4 on 12/06/2018, and 21.4 on 04/04/2019.  Bilateral breast MRIon 08/20/2017 revealed a 2.4 cm enhancing irregular mass in the outer left breast. There were several prominent lymph nodes with borderline thicked cortices between 3-4 mm, but normal morphology. There was a 5 mm enhancing mass in the anterior, retroareolar region of the right breast. RIGHT breast retroareolar biopsyon 09/02/2017 revealed no evidence of malignancy. There was fibrocystic change and usual ductal hyperplasia.  Diagnostic right mammogram and  ultrasound on 10/18/2018 revealed no suspicious mass, malignant type microcalcifications or distortion detected in the right breast. Spot tangential view of the area of clinical concern in the right breast showed normal fibroglandular tissue. There was no solid or cystic mass, abnormal shadowing or distortion visualized.  Bilateral breast MRI on 11/09/2018 revealed left mastectomy with implant reconstruction.  There was no suspicious enhancement.  Right breast was negative.  She underwent left breast lipofilling for symmetry on 12/23/2018.    Chest CTon 09/23/2016 revealed a 4 mm RUL subpleural nodule. Chest CTon 08/17/2017 revealed a small 4 mm RUL nodule unchanged in  size.  [place] 10/14/2017 revealed an EF of 55-60%.  Bone densityon 08/17/2017 revealed osteopeniawith a T-score of -1.5 in the left femoral neck and -0.7 in the AP spine L1-L4.  She has a significant family history of breast cancer(paternal grandmother, sister, and 3 maternal cousins). 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  She was admitted to [facility] 09/11/2017 - 09/15/2017 with enteropathogenic E.Coli (EPEC)and associated acute renal failure(creatinine 1.87; CrCl 28.3 ml/min). She was treated with intravenous azithromycin and ceftriaxone.  Symptomatically, ***  Plan: 1.   Labs today; CBC with diff, CMP, CA 27.29.  2.Stage IA left breast cancer Clinically, she continues to do well. She is s/p neoadjuvant chemotherapy followed by mastectomy. Mammogram on 10/18/2018 and breast MRI on 11/09/2018 revealed no evidence of disease. She is s/p left breast lipofilling on 12/23/2018. Continue Femara. 3. Osteopenia She remains on calcium and vitamin D.  Schedule bone density on 08/18/2019.             Review plan for Prolia if bone density declines. 4.   RTC in 4 months for MD assessment and labs (CBC with diff, CMP, CA 27.29).  I discussed the assessment and treatment plan with the patient.  The patient was provided an opportunity to ask questions and all were answered.  The patient agreed with the plan and demonstrated an understanding of the instructions.  The patient was advised to call back if the symptoms worsen or if the condition fails to improve as anticipated.   Lequita Asal, MD, PhD    08/02/2019, 11:55 AM  I, Selena Batten, am acting as scribe for [employer]. Mike Gip, MD, PhD.  I, Sandralee Tarkington C. Mike Gip, MD, have reviewed the above documentation for accuracy and completeness, and I agree with the above.

## 2019-08-09 NOTE — Progress Notes (Signed)
[facility]  [address], [address] [place],  [address] Phone: [phone]  Fax: [phone]   Clinic Day:  08/11/2019  Referring physician: Marinda Elk, MD  Chief Complaint: Mariah Hogan is a 69 y.o. female with clinical stage IA left breast canceron Mariah Hogan is seen for 4 month assessment.   HPI: The patient was last seen in the medical oncology clinic on 04/04/2019. At that time, she was doing well. Exam was unremarkable. CBC was normal. Calcium was 8.8. CA 27.29 was 21.4. She continued on Femara.   She saw Dr. Marla Roe on 04/15/2019. She was not interested in nipple areola reconstruction. She noticed a little bit of tenderness at the lateral and superior aspect of the left breast. She had great range of motion.  She had not been doing much or any massages.  The implant felt soft. The incision had healed very nicely and there was no sign of seroma or infection. Patient will follow up in 1 year.   She has an initial consultation with Hampton Abbot, PA in the orthopedics clinic on 07/07/2019. She had left knee pain and swelling. She had early arthritis. Her activity was decreased due to pain. She felt unstable with walking.  She was advised to remain on Mobic, use a knee brace and ice.   Left knee MRI on 07/12/2019 revealed small focal tear of the posterior horn of the medial meniscus at the posteromedial corner. There was extensive full-thickness cartilage loss at the apex of the patella. There was focal tiny areas of full-thickness cartilage loss in the medial and lateral compartments.  During the interim, she has been doing well. Patient is anticipating a total left knee replacement due to left knee MRI results. Her diarrhea has significantly improved. She has more energy. She stopped Femara at the end on March secondary to the COVID-19 vaccine. Her last COVID-19 vaccine was at the end of April. Patient agreed to start Arimidex 1 mg a day.  She has some stiffness in her thumb but feels like it is getting better. She continues to have acid reflux and is watching the type of foods she eats. She is actively trying to lose weight. She reports a small cataracts. The patient has no breast concerns. She is examining her breast monthly. Her calcium is 8.6, she is taking oral calcium. Her BP was 152/56 in the clinic today.    Past Medical History:  Diagnosis Date  . Anemia   . Arthritis   . Asthma    H/O YEARS AGO-NOW HAS COUGHING SPELLS WHICH IS WHY SHE HAS HER ALBUTEROL INHALER  . BRCA gene mutation negative 08/12/2017   Variant of unknown significance at [facility] only abnormality.  . Breast cancer ([place]) 08/04/2017   1.8 cm ER 90%, PR 20%, HER-2/neu negative invasive mammary carcinoma of the left upper outer quadrant.  MammaPrint: High risk.  . Cancer ([place])    [phone] basal cell carcinoma  . Complication of anesthesia    HARD TIME WAKING UP  . Dysrhythmia    TACHYCARDIA  . GERD (gastroesophageal reflux disease)   . History of hiatal hernia    SMALL  . Hyperlipidemia   . Hypertension    PT STATES IN 01-18-18 PREOP PHONE INTERVIEW THAT HER BP HAS BEEN ELEVATED SINCE STARTING ON VALSARTAN- PCP SWITCHED PT BACK TO MAXIDE ON 01-18-18 AND PT STATES THAT SHE HAS LOST 6-10 POUNDS OF FLUID SINCE RESTARTING MAXIDE.    Marland Kitchen Personal history of chemotherapy   .  PONV (postoperative nausea and vomiting)     Past Surgical History:  Procedure Laterality Date  . ABDOMINAL HYSTERECTOMY  1998  . bladder tack  [phone]  . BREAST BIOPSY Left 1990  . BREAST BIOPSY Left 2018   core bx- neg  . 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  Marland Kitchen BREAST BIOPSY Right 08/2017   MRI biopsy, FIBROCYSTIC CHANGE AND USUAL DUCTAL HYPERPLASIA,, clip placed  . BREAST CYST EXCISION  x3  . BREAST EXCISIONAL BIOPSY Bilateral   . BREAST RECONSTRUCTION WITH PLACEMENT OF TISSUE EXPANDER AND FLEX HD (ACELLULAR HYDRATED DERMIS) Left  01/27/2018   Procedure: BREAST RECONSTRUCTION WITH PLACEMENT OF TISSUE EXPANDER AND FLEX HD (ACELLULAR HYDRATED DERMIS);  Surgeon: Wallace Going, DO;  Location: [facility] ORS;  Service: Plastics;  Laterality: Left;  . BREAST SURGERY Left 02/13/14   Fibrocystic changes, pseudo-angiomatous stromal hyperplasia. No atypia or malignancy.  . CARPAL TUNNEL RELEASE  x2  . CHOLECYSTECTOMY  2008  . COLONOSCOPY WITH PROPOFOL N/A 01/12/2015   Procedure: COLONOSCOPY WITH PROPOFOL;  Surgeon: Manya Silvas, MD;  Location: [facility] ENDOSCOPY;  Service: Endoscopy;  Laterality: N/A;  . ESOPHAGOGASTRODUODENOSCOPY (EGD) WITH PROPOFOL N/A 01/12/2015   Procedure: ESOPHAGOGASTRODUODENOSCOPY (EGD) WITH PROPOFOL;  Surgeon: Manya Silvas, MD;  Location: [facility] ENDOSCOPY;  Service: Endoscopy;  Laterality: N/A;  . LIPOSUCTION WITH LIPOFILLING Left 12/23/2018   Procedure: LIPOSUCTION WITH LIPOFILLING FROM ABDOMEN TO LEFT BREAST;  Surgeon: Wallace Going, DO;  Location: [place];  Service: Plastics;  Laterality: Left;  90 min, please  . MASTECTOMY Left 2019   [place]  . MASTECTOMY W/ SENTINEL NODE BIOPSY Left 01/27/2018   ypT1c ypN0; ER 90%, PR 20%, HER-2/neu not overexpressed.  MASTECTOMY WITH SENTINEL LYMPH NODE BIOPSY;  Surgeon: Robert Bellow, MD;  Location: [facility] ORS;  Service: General;  Laterality: Left;  . MINOR BREAST BIOPSY Right 09/02/2017   Radiology perform procedure, fibrocystic changes right retroareolar area.  Marland Kitchen NASAL RECONSTRUCTION  1983  . OOPHORECTOMY    . PORTACATH PLACEMENT N/A 10/21/2017   Procedure: INSERTION PORT-A-CATH;  Surgeon: Robert Bellow, MD;  Location: [facility] ORS;  Service: General;  Laterality: N/A;  . REMOVAL OF TISSUE EXPANDER AND PLACEMENT OF IMPLANT Left 05/13/2018   Procedure: REMOVAL OF TISSUE EXPANDER AND PLACEMENT OF IMPLANT;  Surgeon: Wallace Going, DO;  Location: [facility] ORS;  Service: Plastics;  Laterality: Left;  total surgery time should be 3 hours, per  provider  . SAVORY DILATION N/A 01/12/2015   Procedure: SAVORY DILATION;  Surgeon: Manya Silvas, MD;  Location: [facility] ENDOSCOPY;  Service: Endoscopy;  Laterality: N/A;  . TONSILLECTOMY AND ADENOIDECTOMY  1956    Family History  Problem Relation Age of Onset  . Breast cancer Sister 34       currently 76  . Breast cancer Paternal Grandmother 80       bilateral; deceased 41s   . Breast cancer Cousin 49       daughter of an unaffected maternal aunt  . Breast cancer Cousin 69       kidney cancer also; daughter of an unaffected maternal aunt  . Other Mother        TAH/BSO prior to menopause  . Prostate cancer Paternal Grandfather        age at dx unknown  . Breast cancer Maternal Aunt        age at dx unknown  . Breast cancer Cousin 60  daughter of an unaffected maternal aunt    Social History:  reports that she has never smoked. She has never used smokeless tobacco. She reports that she does not drink alcohol or use drugs. Her daughter, [name], is a gastroenterology PA. She is a retired Customer service manager for 35 years. Patient denies known exposures to radiation on toxins. She has a new 6 month old grandson named Charles. She has another 92 year old grandchild. The patient is alone today.  Allergies:  Allergies  Allergen Reactions  . Penicillin G Shortness Of Breath, Swelling, Rash and Other (See Comments)    Has patient had a PCN reaction causing immediate rash, facial/tongue/throat swelling, SOB or lightheadedness with hypotension: Yes Has patient had a PCN reaction causing severe rash involving mucus membranes or skin necrosis: No Has patient had a PCN reaction that required hospitalization: No Has patient had a PCN reaction occurring within the last 10 years: No If all of the above answers are "NO", then may proceed with Cephalosporin use.  [name] [Docetaxel] Shortness Of Breath, Palpitations and Other (See Comments)    Back pain, Back Spasms  . Ciprofloxacin Rash  .  Other Other (See Comments)    Dodecyl gallate: Positive patch test  Elta MD UV Clear SPF 46: Positive patch test  fragrance dodocylgallate dodocylgallate  . Parabens Other (See Comments)    Positive patch test   . Shellac Other (See Comments)    Positive patch test   . Fluocinonide Rash  . Latex Rash  . Tape Dermatitis  . Triamcinolone Rash    Current Medications: Current Outpatient Medications  Medication Sig Dispense Refill  . atenolol (TENORMIN) 50 MG tablet Take 50 mg by mouth every morning.     . cetirizine (ZYRTEC) 10 MG tablet Take 10 mg by mouth daily before lunch.     . citalopram (CELEXA) 20 MG tablet Take 20 mg by mouth at bedtime.     Marland Kitchen desoximetasone (TOPICORT) 0.25 % cream Apply to aa's rash BID    . pantoprazole (PROTONIX) 40 MG tablet Take 40 mg by mouth 2 (two) times daily as needed (heartburn).     Marland Kitchen PREMARIN vaginal cream Place 1 Applicatorful vaginally 2 (two) times a week.     . triamterene-hydrochlorothiazide (MAXZIDE-25) 37.5-25 MG tablet Take 1 tablet by mouth every morning. 1/2 tab    . valACYclovir (VALTREX) 1000 MG tablet Take 1,000 mg by mouth See admin instructions. Take 1000 mg in the morning , may take a second 1000 mg dose as needed for fever blisters     . albuterol (PROVENTIL HFA;VENTOLIN HFA) 108 (90 Base) MCG/ACT inhaler Inhale 2 puffs into the lungs every 6 (six) hours as needed for wheezing or shortness of breath. (Patient not taking: Reported on 08/11/2019) 1 Inhaler 1  . diazepam (VALIUM) 2 MG tablet Take 1 tablet (2 mg total) by mouth every 6 (six) hours as needed for anxiety. (Patient not taking: Reported on 08/11/2019) 30 tablet 0  . letrozole (FEMARA) 2.5 MG tablet Take 1 tablet (2.5 mg total) by mouth daily. Future refill will be AFTER MD visit and lab check. (Patient not taking: Reported on 08/11/2019) 90 tablet 3   No current facility-administered medications for this visit.    Review of Systems  Constitutional: Positive for weight loss (6  lbs; intentional). Negative for chills, diaphoresis, fever and malaise/fatigue.       Doing well.  More energy.  HENT: Negative.  Negative for congestion, ear pain, hearing loss,  nosebleeds, sinus pain, sore throat and tinnitus.   Eyes: Negative.  Negative for blurred vision, double vision and photophobia.       Slight cataracts.  Respiratory: Negative.  Negative for cough, hemoptysis, sputum production, shortness of breath and wheezing.   Cardiovascular: Negative.  Negative for chest pain, palpitations, orthopnea, leg swelling and PND.  Gastrointestinal: Positive for heartburn (reflux; depends on type of food). Negative for abdominal pain, blood in stool, constipation, diarrhea (loose stools; significantly improved), melena, nausea and vomiting.  Genitourinary: Negative.  Negative for dysuria, frequency, hematuria and urgency.  Musculoskeletal: Negative.  Negative for back pain, falls, joint pain, myalgias and neck pain.       Stiffness in thumb, getting better.  Skin: Negative.  Negative for itching and rash.  Neurological: Negative.  Negative for dizziness, tremors, sensory change, speech change, focal weakness, weakness and headaches.  Endo/Heme/Allergies: Negative.  Does not bruise/bleed easily.  Psychiatric/Behavioral: Negative.  Negative for depression and memory loss. The patient is not nervous/anxious and does not have insomnia.   All other systems reviewed and are negative.  Performance status (ECOG): 0-1  Vitals Blood pressure (!) 152/56, pulse 68, temperature 98.3 F (36.8 C), temperature source Tympanic, weight 162 lb 0.6 oz (73.5 kg).   Physical Exam  Constitutional: She is oriented to person, place, and time. She appears well-developed and well-nourished. No distress.  HENT:  Head: Normocephalic and atraumatic.  Mouth/Throat: Oropharynx is clear and moist. No oropharyngeal exudate.  Short gray hair.  Mask.  Eyes: Pupils are equal, round, and reactive to light. Conjunctivae  and EOM are normal. No scleral icterus.  Blue eyes.  Neck: No JVD present.  Cardiovascular: Normal rate, regular rhythm and normal heart sounds.  No murmur heard. Pulmonary/Chest: Effort normal and breath sounds normal. No respiratory distress. She has no wheezes. She has no rales. Right breast exhibits no inverted nipple, no mass, no nipple discharge, no skin change and no tenderness. Left breast exhibits no inverted nipple, no mass, no nipple discharge, no skin change and no tenderness.  s/p left mastectomy with saline implant.  Abdominal: Soft. Bowel sounds are normal. She exhibits no mass. There is no abdominal tenderness. There is no rebound and no guarding.  Musculoskeletal:        General: No edema. Normal range of motion.     Cervical back: Normal range of motion and neck supple.  Lymphadenopathy:       Head (right side): No preauricular, no posterior auricular and no occipital adenopathy present.       Head (left side): No preauricular, no posterior auricular and no occipital adenopathy present.    She has no cervical adenopathy.    She has no axillary adenopathy.       Right: No supraclavicular adenopathy present.       Left: No supraclavicular adenopathy present.  Neurological: She is alert and oriented to person, place, and time. She has normal reflexes.  Skin: Skin is warm and dry. No rash noted. She is not diaphoretic. No erythema. No pallor.  Psychiatric: She has a normal mood and affect. Her behavior is normal. Judgment and thought content normal.  Nursing note and vitals reviewed.   Appointment on 08/11/2019  Component Date Value Ref Range Status  . WBC 08/11/2019 5.2  4.0 - 10.5 K/uL Final  . RBC 08/11/2019 4.45  3.87 - 5.11 MIL/uL Final  . Hemoglobin 08/11/2019 13.8  12.0 - 15.0 g/dL Final  . HCT 08/11/2019 40.2  36.0 - 46.0 %  Final  . MCV 08/11/2019 90.3  80.0 - 100.0 fL Final  . MCH 08/11/2019 31.0  26.0 - 34.0 pg Final  . MCHC 08/11/2019 34.3  30.0 - 36.0 g/dL  Final  . RDW 08/11/2019 12.2  11.5 - 15.5 % Final  . Platelets 08/11/2019 217  150 - 400 K/uL Final  . nRBC 08/11/2019 0.0  0.0 - 0.2 % Final  . Neutrophils Relative % 08/11/2019 64  % Final  . Neutro Abs 08/11/2019 3.3  1.7 - 7.7 K/uL Final  . Lymphocytes Relative 08/11/2019 25  % Final  . Lymphs Abs 08/11/2019 1.3  0.7 - 4.0 K/uL Final  . Monocytes Relative 08/11/2019 6  % Final  . Monocytes Absolute 08/11/2019 0.3  0.1 - 1.0 K/uL Final  . Eosinophils Relative 08/11/2019 4  % Final  . Eosinophils Absolute 08/11/2019 0.2  0.0 - 0.5 K/uL Final  . Basophils Relative 08/11/2019 1  % Final  . Basophils Absolute 08/11/2019 0.0  0.0 - 0.1 K/uL Final  . Immature Granulocytes 08/11/2019 0  % Final  . Abs Immature Granulocytes 08/11/2019 0.02  0.00 - 0.07 K/uL Final   Performed at [facility], [address], [place], [place] [address]    Assessment:  SHADAY RAYBORN is a 70 y.o. female with clinical stage IA left breast cancers/p neoadjuvant chemotherapy followed by mastectomy,sentinel lymph node biopsy, and breast reconstruction with placement of a tissue expander on 01/27/2018. Pathologyrevealed a 1.1 cm grade II invasive mammary carcinoma of no special type. There was no lymphovascular invasion. 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 Margins were negative. There were 2 negative sentinel lymph nodes. Pathologic stagewas ypT1c ypN0.  Original breast biopsy on 08/04/2017 revealed at least grade II invasive ductal carcinoma with DCIS. Tumor was ER + (95%), PR+ (20%), and Her 2 neu - by FISH. Ki67 was 80%.   MammaPrintwas high risk luminal-type B with a 10 year risk of recurrence untreated of 29% and 94.6% of distant metastasis free interval with chemotherapy + hormonal therapy.  She receivedneoadjuvant Femara(08/12/2017 - 08/31/2017)secondary to planned delay in surgery (mastectomy with immediate reconstruction). Femara was discontinued after high risk Mammaprint returned. She  received 1 cycle of neoadjuvant Taxotere and Cytoxan(09/03/2017). Cycle #2 on 10/01/2017 was truncated secondary to a Taxotere infusion reaction (back pain, SOB, shaking). She received 3 cycles ofneoadjuvant AC (10/27/2017 - 12/08/2017) with Udencya support.  She underwent left breast lipofilling for symmetry on 12/23/2018.  She began [facility] 02/09/2018. She stopped Femara at the end of 05/2019.  CA27.29 has been followed: 17.3 on 08/06/2017, 17.0 on 06/09/2018, 25.2 on 08/31/2018, 18.4 on 12/06/2018, 21.4 on 04/04/2019, and 16.7 on 08/11/2019.  Bilateral breast MRIon 08/20/2017 revealed a 2.4 cm enhancing irregular mass in the outer left breast. There were several prominent lymph nodes with borderline thicked cortices between 3-4 mm, but normal morphology. There was a 5 mm enhancing mass in the anterior, retroareolar region of the right breast. RIGHT breast retroareolar biopsyon 09/02/2017 revealed no evidence of malignancy. There was fibrocystic change and usual ductal hyperplasia.  Diagnostic right mammogram and ultrasound on 10/18/2018 revealed no suspicious mass, malignant type microcalcifications or distortion detected in the right breast. Spot tangential view of the area of clinical concern in the right breast showed normal fibroglandular tissue. There was no solid or cystic mass, abnormal shadowing or distortion visualized.  Bilateral breast MRI on 11/09/2018 revealed left mastectomy with implant reconstruction.  There was no suspicious enhancement.  Right breast was negative.  She  underwent left breast lipofilling for symmetry on 12/23/2018.    Chest CTon 09/23/2016 revealed a 4 mm RUL subpleural nodule. Chest CTon 08/17/2017 revealed a small 4 mm RUL nodule unchanged in size.  [place] 10/14/2017 revealed an EF of 55-60%.  Bone densityon 08/17/2017 revealed osteopeniawith a T-score of -1.5 in the left femoral neck and -0.7 in the AP spine L1-L4.  She has a  significant family history of breast cancer(paternal grandmother, sister, and 3 maternal cousins). 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  She was admitted to [facility] 09/11/2017 - 09/15/2017 with enteropathogenic E.Coli (EPEC)and associated acute renal failure(creatinine 1.87; CrCl 28.3 ml/min). She was treated with intravenous azithromycin and ceftriaxone.  She received her last COVID-19 vaccine at the end of 06/2019.  Symptomatically, she denies any breast concerns.  She is anticipating a knee replacement.  Exam is stable.  Plan: 1.   Labs today; CBC with diff, CMP, CA 27.29. 2.Stage IA left breast cancer Clinically, she is doing well. Exam reveals no evidence of recurrent disease. She is s/p neoadjuvant chemotherapy followed by mastectomy. Mammogram on 10/18/2018 and breast MRI on 11/09/2018 revealed no evidence of disease. She is s/p left breast lipofill on 12/23/2018. She has been off Femara since the end of 05/2019. She would like to try Armidex.  Information provided. Rx: Arimidex 1 mg p.o. daily (dispense #30). 3. Osteopenia Sheis on calcium and vitamin D.  Bone density on 08/23/2019.             Discuss plan for Prolia if bone density declines. 4.   Right screening mammogram 10/18/2019. 5.   RTC in 4 months for MD assessment and labs (CBC with diff, CMP, CA 27.29).  I discussed the assessment and treatment plan with the patient.  The patient was provided an opportunity to ask questions and all were answered.  The patient agreed with the plan and demonstrated an understanding of the instructions.  The patient was advised to call back if the symptoms worsen or if the condition fails to improve as anticipated.  I provided 19 minutes of face-to-face time during this encounter and > 50% was spent counseling as documented under my assessment and plan.  An  additional 5 minutes were spent reviewing her chart (Epic and Care Everywhere) including notes, labs, and imaging studies.  I provided these services from the [facility] office.   Lequita Asal, MD, PhD    08/11/2019, 11:35 AM  I, Selena Batten, am acting as scribe for [employer]. Mike Gip, MD, PhD.  I, Cinde Ebert C. Mike Gip, MD, have reviewed the above documentation for accuracy and completeness, and I agree with the above.

## 2019-08-10 DIAGNOSIS — S83232A Complex tear of medial meniscus, current injury, left knee, initial encounter: Secondary | ICD-10-CM | POA: Insufficient documentation

## 2019-08-10 DIAGNOSIS — M1712 Unilateral primary osteoarthritis, left knee: Secondary | ICD-10-CM | POA: Insufficient documentation

## 2019-08-11 ENCOUNTER — Encounter: Payer: Self-pay | Admitting: Hematology and Oncology

## 2019-08-11 ENCOUNTER — Inpatient Hospital Stay: Payer: Medicare Other | Attending: Hematology and Oncology

## 2019-08-11 ENCOUNTER — Other Ambulatory Visit: Payer: Self-pay

## 2019-08-11 ENCOUNTER — Inpatient Hospital Stay (HOSPITAL_BASED_OUTPATIENT_CLINIC_OR_DEPARTMENT_OTHER): Payer: Medicare Other | Admitting: Hematology and Oncology

## 2019-08-11 VITALS — BP 152/56 | HR 68 | Temp 98.3°F | Wt 162.0 lb

## 2019-08-11 DIAGNOSIS — M85852 Other specified disorders of bone density and structure, left thigh: Secondary | ICD-10-CM

## 2019-08-11 DIAGNOSIS — C50912 Malignant neoplasm of unspecified site of left female breast: Secondary | ICD-10-CM | POA: Diagnosis not present

## 2019-08-11 DIAGNOSIS — Z170 Estrogen receptor positive status [ER+]: Secondary | ICD-10-CM | POA: Diagnosis not present

## 2019-08-11 DIAGNOSIS — M8580 Other specified disorders of bone density and structure, unspecified site: Secondary | ICD-10-CM | POA: Diagnosis not present

## 2019-08-11 DIAGNOSIS — Z79811 Long term (current) use of aromatase inhibitors: Secondary | ICD-10-CM | POA: Insufficient documentation

## 2019-08-11 DIAGNOSIS — C50412 Malignant neoplasm of upper-outer quadrant of left female breast: Secondary | ICD-10-CM | POA: Insufficient documentation

## 2019-08-11 LAB — COMPREHENSIVE METABOLIC PANEL
ALT: 18 U/L (ref 0–44)
AST: 17 U/L (ref 15–41)
Albumin: 3.7 g/dL (ref 3.5–5.0)
Alkaline Phosphatase: 113 U/L (ref 38–126)
Anion gap: 8 (ref 5–15)
BUN: 24 mg/dL — ABNORMAL HIGH (ref 8–23)
CO2: 25 mmol/L (ref 22–32)
Calcium: 8.6 mg/dL — ABNORMAL LOW (ref 8.9–10.3)
Chloride: 104 mmol/L (ref 98–111)
Creatinine, Ser: 1.06 mg/dL — ABNORMAL HIGH (ref 0.44–1.00)
GFR calc Af Amer: >60 mL/min (ref >60)
GFR calc non Af Amer: 53 mL/min — ABNORMAL LOW (ref >60)
Glucose, Bld: 98 mg/dL (ref 70–99)
Potassium: 4.2 mmol/L (ref 3.5–5.1)
Sodium: 137 mmol/L (ref 135–145)
Total Bilirubin: 0.8 mg/dL (ref 0.3–1.2)
Total Protein: 6.8 g/dL (ref 6.5–8.1)

## 2019-08-11 LAB — CBC WITH DIFFERENTIAL/PLATELET
Abs Immature Granulocytes: 0.02 10*3/uL (ref 0.00–0.07)
Basophils Absolute: 0 10*3/uL (ref 0.0–0.1)
Basophils Relative: 1 %
Eosinophils Absolute: 0.2 10*3/uL (ref 0.0–0.5)
Eosinophils Relative: 4 %
HCT: 40.2 % (ref 36.0–46.0)
Hemoglobin: 13.8 g/dL (ref 12.0–15.0)
Immature Granulocytes: 0 %
Lymphocytes Relative: 25 %
Lymphs Abs: 1.3 10*3/uL (ref 0.7–4.0)
MCH: 31 pg (ref 26.0–34.0)
MCHC: 34.3 g/dL (ref 30.0–36.0)
MCV: 90.3 fL (ref 80.0–100.0)
Monocytes Absolute: 0.3 10*3/uL (ref 0.1–1.0)
Monocytes Relative: 6 %
Neutro Abs: 3.3 10*3/uL (ref 1.7–7.7)
Neutrophils Relative %: 64 %
Platelets: 217 10*3/uL (ref 150–400)
RBC: 4.45 MIL/uL (ref 3.87–5.11)
RDW: 12.2 % (ref 11.5–15.5)
WBC: 5.2 10*3/uL (ref 4.0–10.5)
nRBC: 0 % (ref 0.0–0.2)

## 2019-08-11 MED ORDER — ANASTROZOLE 1 MG PO TABS: 1.0000 mg | ORAL_TABLET | Freq: Every day | ORAL | 0 refills | Status: DC

## 2019-08-11 NOTE — Patient Instructions (Signed)
Anastrozole tablets What is this medicine? ANASTROZOLE (an AS troe zole) is used to treat breast cancer in women who have gone through menopause. Some types of breast cancer depend on estrogen to grow, and this medicine can stop tumor growth by blocking estrogen production. This medicine may be used for other purposes; ask your health care provider or pharmacist if you have questions. COMMON BRAND NAME(S): Arimidex What should I tell my health care provider before I take this medicine? They need to know if you have any of these conditions:  bone problems  heart disease  high cholesterol  an unusual or allergic reaction to anastrozole, other medicines, foods, dyes, or preservatives  pregnant or trying to get pregnant  breast-feeding How should I use this medicine? Take this medicine by mouth with a glass of water. Follow the directions on the prescription label. You can take it with or without food. If it upsets your stomach, take it with food. Take your medicine at regular intervals. Do not take it more often than directed. Do not stop taking except on your doctor's advice. Talk to your pediatrician regarding the use of this medicine in children. Special care may be needed. Overdosage: If you think you have taken too much of this medicine contact a poison control center or emergency room at once. NOTE: This medicine is only for you. Do not share this medicine with others. What if I miss a dose? If you miss a dose, take it as soon as you can. If it is almost time for your next dose, take only that dose. Do not take double or extra doses. What may interact with this medicine? This medicine may interact with the following medications:  female hormones, like estrogens or progestins and birth control pills, patches, rings, or injections  tamoxifen This list may not describe all possible interactions. Give your health care provider a list of all the medicines, herbs, non-prescription drugs,  or dietary supplements you use. Also tell them if you smoke, drink alcohol, or use illegal drugs. Some items may interact with your medicine. What should I watch for while using this medicine? Visit your doctor or health care professional for regular checks on your progress. Let your doctor or health care professional know about any unusual vaginal bleeding. Do not become pregnant while taking this medicine or for at least 3 weeks after stopping it. Women should inform their doctor if they wish to become pregnant or think they might be pregnant. There is a potential for serious side effects to an unborn child. Talk to your health care professional or pharmacist for more information. Do not breast-feed an infant while taking this medicine or for 2 weeks after stopping it. This medicine may interfere with the ability to have a child. Talk with your doctor or health care professional if you are concerned about your fertility. Using this medicine for a long time may increase your risk of low bone mass. Talk to your doctor about bone health. You should make sure that you get enough calcium and vitamin D while you are taking this medicine. Discuss the foods you eat and the vitamins you take with your health care professional. What side effects may I notice from receiving this medicine? Side effects that you should report to your doctor or health care professional as soon as possible:  allergic reactions like skin rash, itching or hives, swelling of the face, lips, or tongue  signs and symptoms of a blood clot such as breathing problems;  changes in vision; chest pain; sudden headache; pain, swelling, warmth in the leg; trouble speaking; sudden numbness or weakness of the face, arm, or leg  signs and symptoms of infection like fever or chills; cough; sore throat; pain or trouble passing urine Side effects that usually do not require medical attention (report to your doctor or health care professional if they  continue or are bothersome):  bone pain  dizziness  hair loss  headache  hot flashes  joint pain  muscle pain  signs of decreased red blood cells - unusually weak or tired, feeling faint or lightheaded, falls  vaginal discharge, itching, or odor in women This list may not describe all possible side effects. Call your doctor for medical advice about side effects. You may report side effects to FDA at 1-800-FDA-1088. Where should I keep my medicine? Keep out of the reach of children. Store at room temperature between 20 and 25 degrees C (68 and 77 degrees F). Throw away any unused medicine after the expiration date. NOTE: This sheet is a summary. It may not cover all possible information. If you have questions about this medicine, talk to your doctor, pharmacist, or health care provider.  2020 Elsevier/Gold Standard (2017-03-09 14:56:51)

## 2019-08-12 LAB — CANCER ANTIGEN 27.29: CA 27.29: 16.7 U/mL (ref 0.0–38.6)

## 2019-08-23 ENCOUNTER — Other Ambulatory Visit: Payer: Self-pay

## 2019-08-23 ENCOUNTER — Ambulatory Visit
Admission: RE | Admit: 2019-08-23 | Discharge: 2019-08-23 | Disposition: A | Discharge status: Home / Self Care | Payer: Medicare Other | Source: Ambulatory Visit | Attending: Hematology and Oncology | Admitting: Hematology and Oncology

## 2019-08-23 DIAGNOSIS — M85852 Other specified disorders of bone density and structure, left thigh: Secondary | ICD-10-CM | POA: Diagnosis present

## 2019-08-29 ENCOUNTER — Ambulatory Visit (INDEPENDENT_AMBULATORY_CARE_PROVIDER_SITE_OTHER): Payer: Medicare Other | Admitting: Dermatology

## 2019-08-29 ENCOUNTER — Other Ambulatory Visit: Payer: Self-pay

## 2019-08-29 ENCOUNTER — Encounter: Payer: Self-pay | Admitting: Dermatology

## 2019-08-29 DIAGNOSIS — L300 Nummular dermatitis: Secondary | ICD-10-CM

## 2019-08-29 DIAGNOSIS — L820 Inflamed seborrheic keratosis: Secondary | ICD-10-CM

## 2019-08-29 DIAGNOSIS — W57XXXA Bitten or stung by nonvenomous insect and other nonvenomous arthropods, initial encounter: Secondary | ICD-10-CM

## 2019-08-29 DIAGNOSIS — S80262A Insect bite (nonvenomous), left knee, initial encounter: Secondary | ICD-10-CM

## 2019-08-29 DIAGNOSIS — L719 Rosacea, unspecified: Secondary | ICD-10-CM

## 2019-08-29 NOTE — Progress Notes (Signed)
   Follow-Up Visit   Subjective  Mariah Hogan is a 69 y.o. female who presents for the following: spot (nose, came up over 2 weeks ago, bled a little, looked like a "pin hole"getting better now), breaking out (chest, irritated), hx tick bite (left popliteal 2 mos ago, still has hard spot), and growths (left upper thigh, gets irritated). 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  She also gets an itchy scaly rash on her legs.  Pt has Topicort 0.25% cream that she uses off and on.   The following portions of the chart were reviewed this encounter and updated as appropriate:      Review of Systems:  No other skin or systemic complaints except as noted in HPI or Assessment and Plan.  Objective  Well appearing patient in no apparent distress; mood and affect are within normal limits.  A focused examination was performed including face, left leg, chest. Relevant physical exam findings are noted in the Assessment and Plan.  Objective  Left Nasal Tip: Indistinct pink macule  Images    Objective  upper chest x 2, left upper medial thigh x 1 (3): Erythematous keratotic or waxy stuck-on papule  Objective  lower legs: Pink scaly patches with underlying xerosis BL pretibia  Objective  Left Popliteal Fossa: Hyperpigmented firm nodule   Assessment & Plan  Rosacea Left Nasal Tip  Start Soolantra Cream QHS - sample given. Start Eucrisa Oint QAM - sample given  If not improved on f/u, will plan biopsy.   Pt will f/up after her total knee replacement healed  Inflamed seborrheic keratosis (3) upper chest x 2, left upper medial thigh x 1  Destruction of lesion - upper chest x 2, left upper medial thigh x 1  Destruction method: cryotherapy   Informed consent: discussed and consent obtained   Lesion destroyed using liquid nitrogen: Yes   Region frozen until ice ball extended beyond lesion: Yes   Outcome: patient tolerated procedure well with no complications     Post-procedure details: wound care instructions given    Nummular dermatitis lower legs  Cont Topicort 0.25% Cream qd/bid prn flares.  (pt is allergic to topical TMC class steroid creams, but is not allergic to desoximethasone cream)   Topical steroids (such as triamcinolone, fluocinolone, fluocinonide, mometasone, clobetasol, halobetasol, betamethasone, hydrocortisone) can cause thinning and lightening of the skin if they are used for too long in the same area. Your physician has selected the right strength medicine for your problem and area affected on the body. Please use your medication only as directed by your physician to prevent side effects.   Cont CeraVe Ointment and mild soap.  Insect bite of left popliteal region with local reaction, initial encounter Left Popliteal Fossa  Persistent rxn from tick bite 2 months ago.  Discussed with pt that tick bites can take a long time to resolve.  Could also be forming a dermatofibroma.  Will observe.  Can apply Topicort cream bid  Return in about 2 months (around 10/29/2019) for recheck nose.   IJamesetta Orleans, CMA, am acting as scribe for Brendolyn Patty, MD .  Documentation: I have reviewed the above documentation for accuracy and completeness, and I agree with the above.  Brendolyn Patty MD

## 2019-08-29 NOTE — Patient Instructions (Signed)
Cryotherapy Aftercare  . Wash gently with soap and water everyday.   Marland Kitchen Apply Vaseline and Band-Aid daily until healed.   Eucrisa Ointment - Apply to nose every morning. Soolantra Cream - Apply to nose every evening.

## 2019-09-21 ENCOUNTER — Other Ambulatory Visit: Payer: Self-pay | Admitting: Surgery

## 2019-09-22 ENCOUNTER — Other Ambulatory Visit
Admission: RE | Admit: 2019-09-22 | Discharge: 2019-09-22 | Disposition: A | Discharge status: Home / Self Care | Payer: Medicare Other | Source: Ambulatory Visit | Attending: Surgery | Admitting: Surgery

## 2019-09-22 ENCOUNTER — Encounter
Admission: RE | Admit: 2019-09-22 | Discharge: 2019-09-22 | Disposition: A | Discharge status: Home / Self Care | Payer: Medicare Other | Source: Ambulatory Visit | Attending: Surgery | Admitting: Surgery

## 2019-09-22 ENCOUNTER — Encounter: Payer: Self-pay | Admitting: Urgent Care

## 2019-09-22 ENCOUNTER — Other Ambulatory Visit: Payer: Self-pay

## 2019-09-22 DIAGNOSIS — Z20822 Contact with and (suspected) exposure to covid-19: Secondary | ICD-10-CM | POA: Insufficient documentation

## 2019-09-22 DIAGNOSIS — Z01818 Encounter for other preprocedural examination: Secondary | ICD-10-CM | POA: Insufficient documentation

## 2019-09-22 LAB — TYPE AND SCREEN
ABO/RH(D): O POS
Antibody Screen: NEGATIVE

## 2019-09-22 LAB — URINALYSIS, ROUTINE W REFLEX MICROSCOPIC
Bacteria, UA: NONE SEEN
Bilirubin Urine: NEGATIVE
Glucose, UA: NEGATIVE mg/dL
Hgb urine dipstick: NEGATIVE
Ketones, ur: NEGATIVE mg/dL
Nitrite: NEGATIVE
Protein, ur: NEGATIVE mg/dL
Specific Gravity, Urine: 1.019 (ref 1.005–1.030)
pH: 6 (ref 5.0–8.0)

## 2019-09-22 LAB — COMPREHENSIVE METABOLIC PANEL
ALT: 19 U/L (ref 0–44)
AST: 18 U/L (ref 15–41)
Albumin: 4 g/dL (ref 3.5–5.0)
Alkaline Phosphatase: 98 U/L (ref 38–126)
Anion gap: 6 (ref 5–15)
BUN: 21 mg/dL (ref 8–23)
CO2: 28 mmol/L (ref 22–32)
Calcium: 8.9 mg/dL (ref 8.9–10.3)
Chloride: 103 mmol/L (ref 98–111)
Creatinine, Ser: 1.03 mg/dL — ABNORMAL HIGH (ref 0.44–1.00)
GFR calc Af Amer: >60 mL/min (ref >60)
GFR calc non Af Amer: 55 mL/min — ABNORMAL LOW (ref >60)
Glucose, Bld: 108 mg/dL — ABNORMAL HIGH (ref 70–99)
Potassium: 3.6 mmol/L (ref 3.5–5.1)
Sodium: 137 mmol/L (ref 135–145)
Total Bilirubin: 0.7 mg/dL (ref 0.3–1.2)
Total Protein: 6.9 g/dL (ref 6.5–8.1)

## 2019-09-22 LAB — CBC WITH DIFFERENTIAL/PLATELET
Abs Immature Granulocytes: 0.01 10*3/uL (ref 0.00–0.07)
Basophils Absolute: 0 10*3/uL (ref 0.0–0.1)
Basophils Relative: 1 %
Eosinophils Absolute: 0.2 10*3/uL (ref 0.0–0.5)
Eosinophils Relative: 4 %
HCT: 39.4 % (ref 36.0–46.0)
Hemoglobin: 14.3 g/dL (ref 12.0–15.0)
Immature Granulocytes: 0 %
Lymphocytes Relative: 18 %
Lymphs Abs: 0.9 10*3/uL (ref 0.7–4.0)
MCH: 32.3 pg (ref 26.0–34.0)
MCHC: 36.3 g/dL — ABNORMAL HIGH (ref 30.0–36.0)
MCV: 88.9 fL (ref 80.0–100.0)
Monocytes Absolute: 0.4 10*3/uL (ref 0.1–1.0)
Monocytes Relative: 7 %
Neutro Abs: 3.5 10*3/uL (ref 1.7–7.7)
Neutrophils Relative %: 70 %
Platelets: 205 10*3/uL (ref 150–400)
RBC: 4.43 MIL/uL (ref 3.87–5.11)
RDW: 12 % (ref 11.5–15.5)
WBC: 5 10*3/uL (ref 4.0–10.5)
nRBC: 0 % (ref 0.0–0.2)

## 2019-09-22 LAB — SURGICAL PCR SCREEN
MRSA, PCR: NEGATIVE
Staphylococcus aureus: NEGATIVE

## 2019-09-22 LAB — SARS CORONAVIRUS 2 (TAT 6-24 HRS): SARS Coronavirus 2: NEGATIVE

## 2019-09-22 NOTE — Patient Instructions (Signed)
Your procedure is scheduled on: Tues 7/20 Report to Day Surgery. To find out your arrival time please call [phone] between 1PM - 3PM on Mon 7/19.  Remember: Instructions that are not followed completely may result in serious medical risk,  up to and including death, or upon the discretion of your surgeon and anesthesiologist your  surgery may need to be rescheduled.     _X__ 1. Do not eat food after midnight the night before your procedure.                 No gum chewing or hard candies. You may drink clear liquids up to 2 hours                 before you are scheduled to arrive for your surgery- DO not drink clear                 liquids within 2 hours of the start of your surgery.                 Clear Liquids include:  water, apple juice without pulp, clear Gatorade, G2 or                  Gatorade Zero (avoid Red/Purple/Blue), Black Coffee or Tea (Do not add                 anything to coffee or tea). __x___2.   Complete the carbohydrate drink provided to you, 2 hours before arrival.  __X__2.  On the morning of surgery brush your teeth with toothpaste and water, you                may rinse your mouth with mouthwash if you wish.  Do not swallow any toothpaste of mouthwash.     ___ 3.  No Alcohol for 24 hours before or after surgery.   ___ 4.  Do Not Smoke or use e-cigarettes For 24 Hours Prior to Your Surgery.                 Do not use any chewable tobacco products for at least 6 hours prior to                 Surgery.  ___  5.  Do not use any recreational drugs (marijuana, cocaine, heroin, ecstacy, MDMA or other)                For at least one week prior to your surgery.  Combination of these drugs with anesthesia                May have life threatening results.  ____  6.  Bring all medications with you on the day of surgery if instructed.   _x___  7.  Notify your doctor if there is any change in your medical condition      (cold, fever,  infections).     Do not wear jewelry, make-up, hairpins, clips or nail polish. Do not wear lotions, powders, or perfumes. You may wear deodorant. Do not shave 48 hours prior to surgery. Men may shave face and neck. Do not bring valuables to the hospital.    [facility] is not responsible for any belongings or valuables.  Contacts, dentures or bridgework may not be worn into surgery. Leave your suitcase in the car. After surgery it may be brought to your room. For patients admitted to the hospital, discharge time is determined by your treatment  team.   Patients discharged the day of surgery will not be allowed to drive home.   Make arrangements for someone to be with you for the first 24 hours of your Same Day Discharge.    Please read over the following fact sheets that you were given:     __x__ Take these medicines the morning of surgery with A SIP OF WATER:    1. atenolol (TENORMIN) 50 MG tablet  2. cetirizine (ZYRTEC) 10 MG tablet if needed  3. pantoprazole (PROTONIX) 40 MG tablet  4.valACYclovir (VALTREX) 1000 MG tablet  5.  6.  ____ Fleet Enema (as directed)   __x__ Use CHG Soap (or wipes) as directed  ____ Use Benzoyl Peroxide Gel as instructed  ____ Use inhalers on the day of surgery  ____ Stop metformin 2 days prior to surgery    ____ Take 1/2 of usual insulin dose the night before surgery. No insulin the morning          of surgery.   ____ Stop Coumadin/Plavix/aspirin   __x__ Stop Anti-inflammatories  meloxicam (MOBIC) 15 MG tablet today, ibuprofen aleve or aspirin   May take tylenol   ____ Stop supplements until after surgery.    ____ Bring C-Pap to the hospital.

## 2019-09-23 ENCOUNTER — Other Ambulatory Visit: Payer: Medicare Other

## 2019-09-27 ENCOUNTER — Encounter: Admission: RE | Payer: Self-pay | Source: Home / Self Care

## 2019-09-27 ENCOUNTER — Inpatient Hospital Stay: Admission: RE | Admit: 2019-09-27 | Payer: Medicare Other | Source: Home / Self Care | Admitting: Surgery

## 2019-09-27 SURGERY — ARTHROPLASTY, KNEE, TOTAL
Anesthesia: Choice | Site: Knee | Laterality: Left

## 2019-09-29 IMAGING — MR MR BILATERAL BREAST WITHOUT AND WITH CONTRAST
11 of 14 series · 30 of 48 positions shown · IV contrast (multihance)
Comparison: Previous exam(s).

CLINICAL DATA: Breast MRI for extent of disease.
TECHNIQUE: Multiplanar, multisequence MR images of both breasts were obtained
prior to and following the intravenous administration of 13 ml of
MultiHance.

[Series 2: t2_tirm_tra ipat (a-p) · axial · 3.0mm · 0.70mm/px · 1 of 58 slices shown]
[im 1/58]
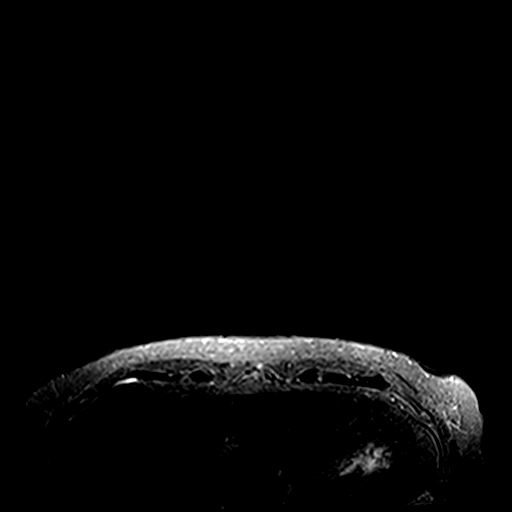

[Series 3: fl3d pre-cm no · axial · non-contrast · 1.2mm · 0.94mm/px · z∈[-92,+79]mm · 3 of 144 slices shown]
[im 1/144]
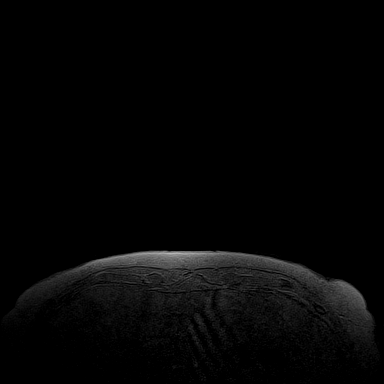
[im 72/144]
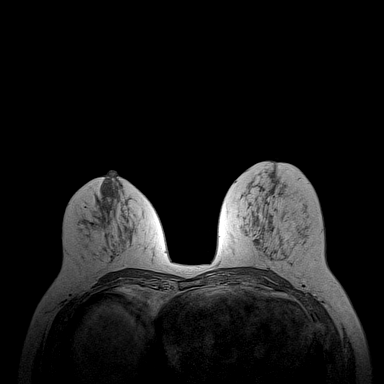
[im 144/144]
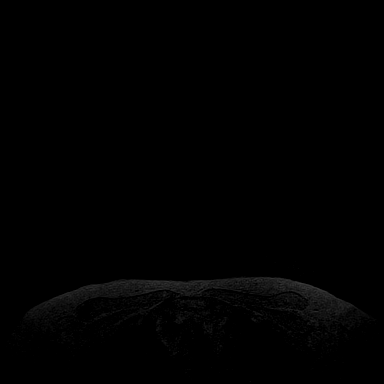

[Series 4: fl3d pre-cm · axial · non-contrast · 1.2mm · 0.94mm/px · z∈[-92,+79]mm · 3 of 144 slices shown]
[im 1/144]
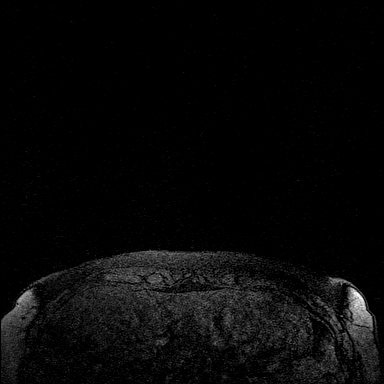
[im 72/144]
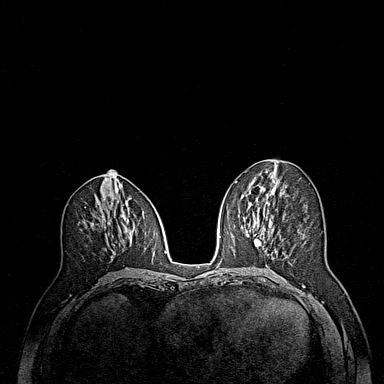
[im 144/144]
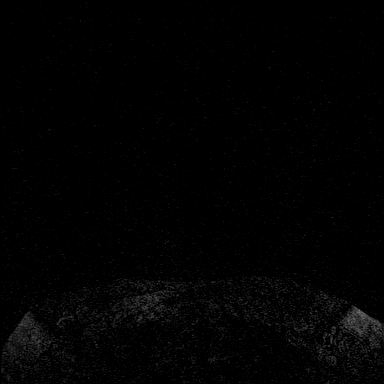

[Series 5: fl3d post-cm 20 · axial · 1.2mm · 0.94mm/px · z∈[-92,+79]mm · 3 of 144 slices shown (1 of 3)]
[im 1/144]
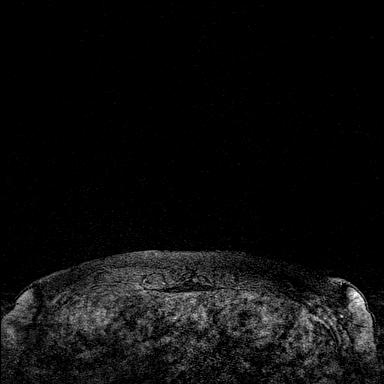
[im 72/144]
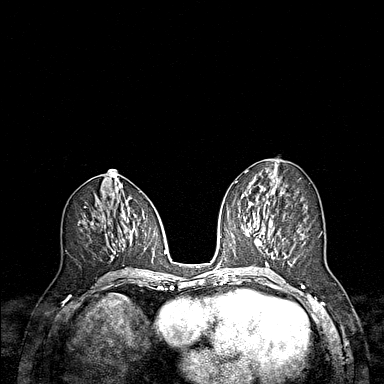
[im 144/144]
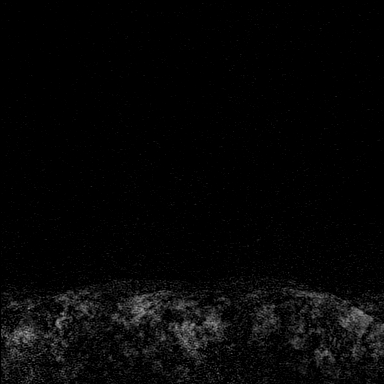

[Series 6: fl3d post-cm 20 · axial · 1.2mm · 0.94mm/px · z∈[-92,+79]mm · 3 of 144 slices shown (2 of 3)]
[im 1/144]
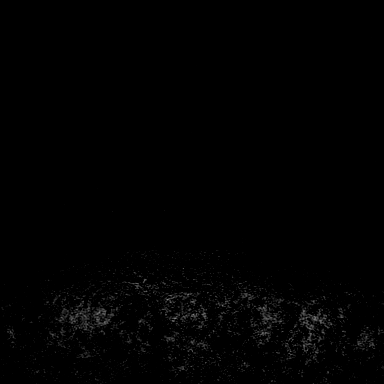
[im 72/144]
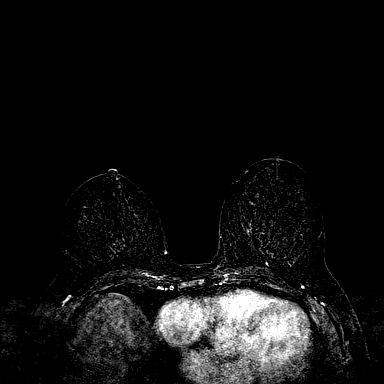
[im 144/144]
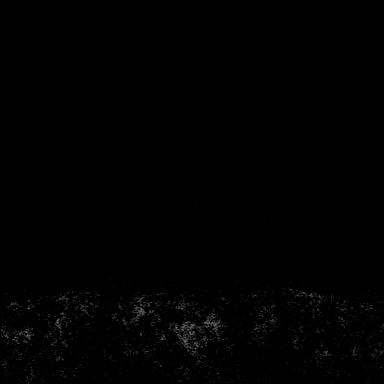

[Series 7: fl3d post-cm 20 · axial · 172.8mm · 0.94mm/px · 1 of 1 slices shown (3 of 3)]
[im 1/1]
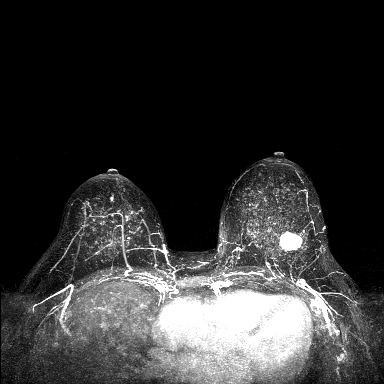

[Series 8: fl3d post-cm 3min · axial · 1.2mm · 0.94mm/px · z∈[-92,+79]mm · 4 of 144 slices shown]
[im 1/144]
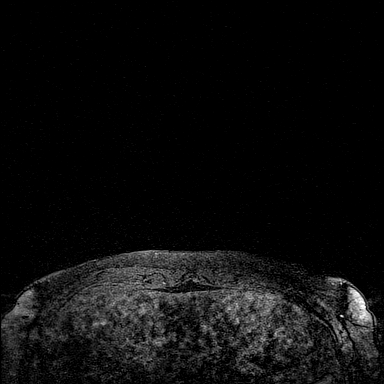
[im 48/144]
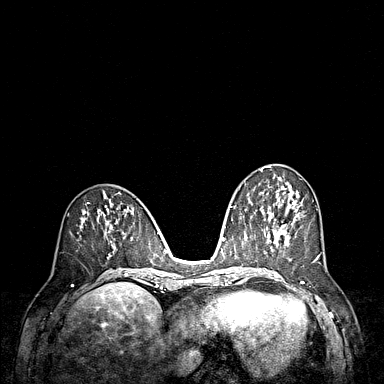
[im 96/144]
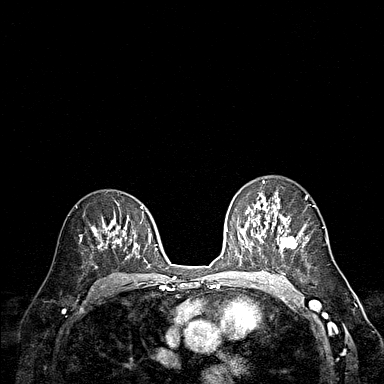
[im 144/144]
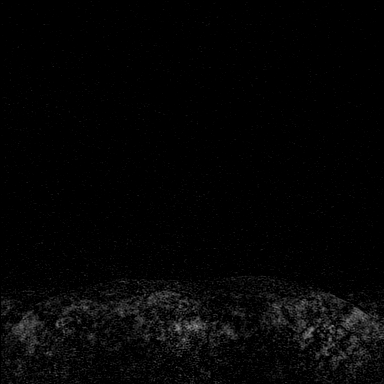

[Series 9: fl3d post-cm 3min_sub · axial · 1.2mm · 0.94mm/px · z∈[-92,+79]mm · 4 of 144 slices shown]
[im 1/144]
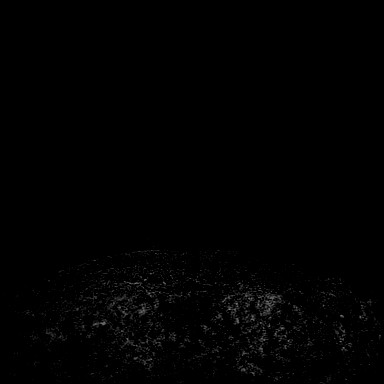
[im 48/144]
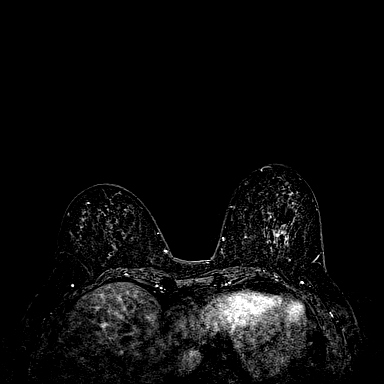
[im 96/144]
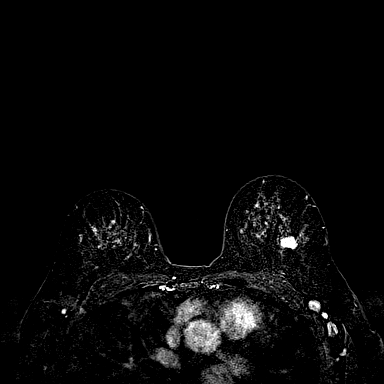
[im 144/144]
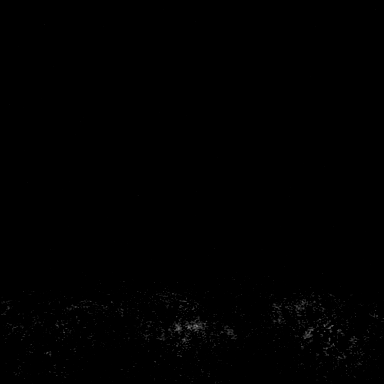

[Series 10: fl3d post-cm 3min_sub_mip_tra · axial · 172.8mm · 0.94mm/px · 1 of 1 slices shown]
[im 1/1]
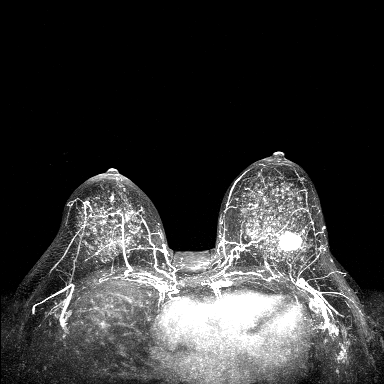

[Series 11: fl3d post-cm 5min · axial · 1.2mm · 0.94mm/px · z∈[-92,+79]mm · 4 of 144 slices shown]
[im 1/144]
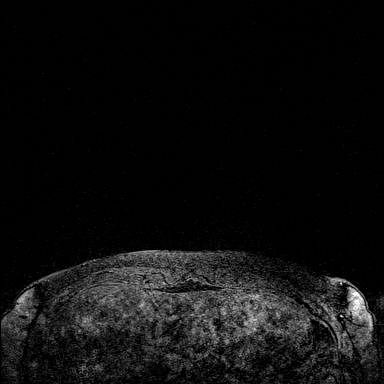
[im 48/144]
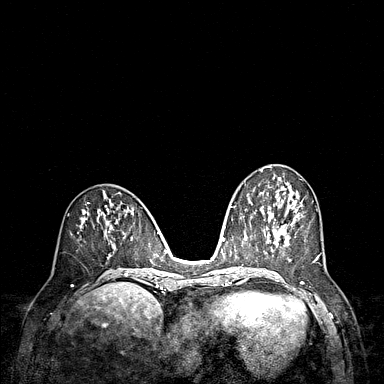
[im 96/144]
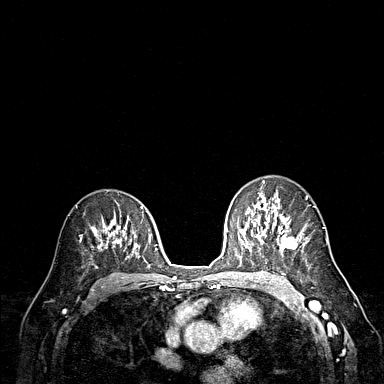
[im 144/144]
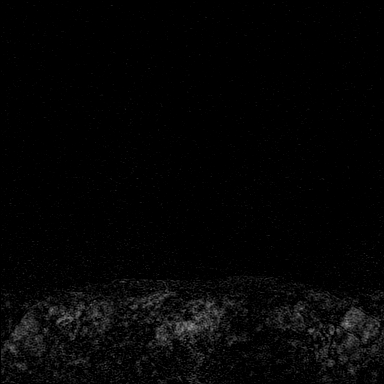

[Series 12: fl3d post-cm 5min_sub · axial · 1.2mm · 0.94mm/px · z∈[-92,+22]mm · 3 of 144 slices shown]
[im 1/144]
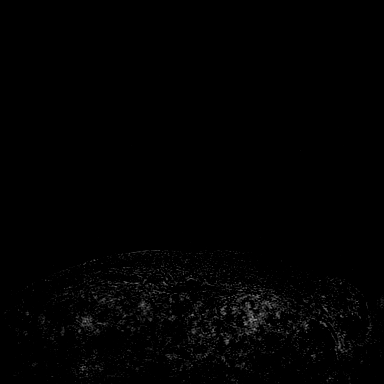
[im 48/144]
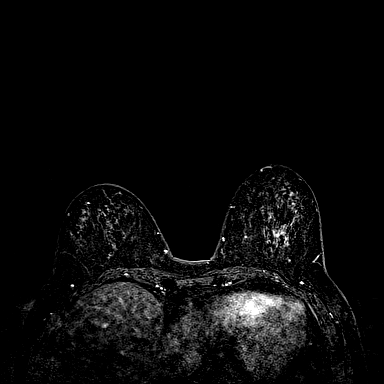
[im 96/144]
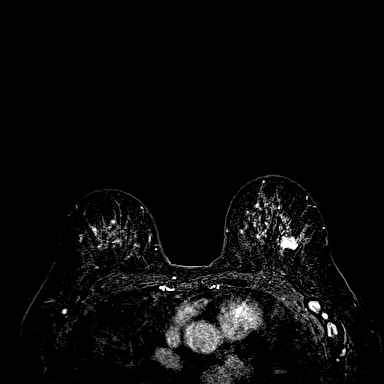

[30 of 48 positions shown; findings below may reference images not displayed]

Recent dx invasive ductal carcinoma left breast 3 weeks ago with bx.
Patient has had multiple surgical biopsies on both breasts (left January 1989,February 2014,May 2016 and right May 2006 patient states all were
negative for cancer.) Family hx paternal grandmother not sure of age
and sister age 54. Started taking a chemo pill 1 week ago.

Patient presented to [facility] at [[facility]ED] on 04/10/2017 with reported spontaneous right nipple
discharge. An etiology for the discharge was not found at diagnostic
imaging. Breast MRI was suggested, but if not performed, short-term
right breast diagnostic mammography and ultrasound was recommended.

Patient underwent ultrasound-guided core needle biopsy of a left
breast mass at the surgical office. Revealing invasive ductal
carcinoma and DCIS.

LABS:  Labs not drawn at time of imaging.

EXAM:
BILATERAL BREAST MRI WITH AND WITHOUT CONTRAST
THREE-DIMENSIONAL MR IMAGE RENDERING ON INDEPENDENT WORKSTATION:

Three-dimensional MR images were rendered by post-processing of the
original MR data on an independent workstation. The
three-dimensional MR images were interpreted, and findings are
reported in the following complete MRI report for this study. Three
dimensional images were evaluated at the independent [employer]
workstation
FINDINGS: Breast composition: c. Heterogeneous fibroglandular tissue.

Background parenchymal enhancement: Moderate.

Right breast: There is a 5 mm enhancing mass in the anterior,
retroareolar right breast with slow to moderate wash-in and plateau
washout kinetics. Enhancement is homogeneous. No other right breast
masses or areas of abnormal enhancement.

Left breast: There is an enhancing irregular mass in the upper outer
left breast measuring 2.4 x 2.1 x 2.2 cm. Mass contains artifact
from biopsy clip. This represents the recently diagnosed breast
carcinoma. There are no other left breast masses or areas of
abnormal enhancement.

Lymph nodes: There prominent left axillary lymph nodes with cortical
thickness is between 3 and 4 mm. Nodes maintain normal overall
morphology with central hilar fat. No prominent right axillary lymph
nodes.

Ancillary findings:  None.
IMPRESSION: 1. 2.4 cm enhancing irregular mass in the outer left breast reflects
the recently diagnosed breast carcinoma.
2. Several prominent left axillary lymph nodes with borderline
thickened cortices between 3 and 4 mm, but normal morphology.
Recommend follow-up left axillary ultrasound for further assessment.
3. 5 mm enhancing mass the anterior, retroareolar region of the
right breast. Given the symptoms of clear right nipple discharge
current history of left breast carcinoma, MRI guided core needle
biopsy is recommended.

RECOMMENDATION:
1. Left axillary ultrasound, if this has not already been performed,
to assess the sonographic appearance of the prominent lymph nodes
noted on MRI.
2. MRI guided biopsy of the small, 5 mm, enhancing retroareolar
right breast mass.

BI-RADS CATEGORY  4: Suspicious.

## 2019-10-12 ENCOUNTER — Other Ambulatory Visit: Payer: Self-pay

## 2019-10-12 ENCOUNTER — Ambulatory Visit (INDEPENDENT_AMBULATORY_CARE_PROVIDER_SITE_OTHER): Payer: Medicare Other | Admitting: Dermatology

## 2019-10-12 DIAGNOSIS — D485 Neoplasm of uncertain behavior of skin: Secondary | ICD-10-CM | POA: Diagnosis not present

## 2019-10-12 NOTE — Progress Notes (Signed)
   Follow-Up Visit   Subjective  Mariah Hogan is a 69 y.o. female who presents for the following: Rosacea (left nasal tip, 2 month follow-up. Using Soolantra Cream with not much improvement.). She has a h/o BCC on R nose treated with Mohs.  Also h/o BCC forehead.  The following portions of the chart were reviewed this encounter and updated as appropriate:      Review of Systems:  No other skin or systemic complaints except as noted in HPI or Assessment and Plan.  Objective  Well appearing patient in no apparent distress; mood and affect are within normal limits.  A focused examination was performed including face. Relevant physical exam findings are noted in the Assessment and Plan.  Objective  Left Nasal Tip: 6.0 x 4.0 mm light pink slightly pearly indistinct macule.      Assessment & Plan  Neoplasm of uncertain behavior of skin Left Nasal Tip  Skin / nail biopsy Type of biopsy: tangential   Informed consent: discussed and consent obtained   Patient was prepped and draped in usual sterile fashion: Area prepped with alcohol. Anesthesia: the lesion was anesthetized in a standard fashion   Anesthetic:  1% lidocaine w/ epinephrine 1-100,000 buffered w/ 8.4% NaHCO3 Instrument used: flexible razor blade   Hemostasis achieved with: pressure, aluminum chloride and electrodesiccation   Outcome: patient tolerated procedure well   Post-procedure details: wound care instructions given   Post-procedure details comment:  Ointment and small bandage applied  Specimen 1 - Surgical pathology Differential Diagnosis: BCC vs other Check Margins: No 6.0 x 4.0 mm light pink slightly pearly indistinct macule.  No improvement after Soolantra treatment.  If positive, will send for MOHS with Dr Lacinda Axon.  Return pending biopsy results.   History of Basal Cell Carcinoma of the Skin - No evidence of recurrence today - Recommend regular full body skin exams - Recommend daily broad spectrum  sunscreen SPF 30+ to sun-exposed areas, reapply every 2 hours as needed.  - Call if any new or changing lesions are noted between office visits  Actinic Damage - diffuse erythematous macules with underlying dyspigmentation - Recommend daily broad spectrum sunscreen SPF 30+ to sun-exposed areas, reapply every 2 hours as needed.  - Call for new or changing lesions.   Documentation: I have reviewed the above documentation for accuracy and completeness, and I agree with the above.  Brendolyn Patty MD

## 2019-10-12 NOTE — Patient Instructions (Signed)
Wound Care Instructions  1. Cleanse wound gently with soap and water once a day then pat dry with clean gauze. Apply a thing coat of Petrolatum (petroleum jelly, "Vaseline") over the wound (unless you have an allergy to this). We recommend that you use a new, sterile tube of Vaseline. Do not pick or remove scabs. Do not remove the yellow or white "healing tissue" from the base of the wound.  2. Cover the wound with fresh, clean, nonstick gauze and secure with paper tape. You may use Band-Aids in place of gauze and tape if the would is small enough, but would recommend trimming much of the tape off as there is often too much. Sometimes Band-Aids can irritate the skin.  3. You should call the office for your biopsy report after 1 week if you have not already been contacted.  4. If you experience any problems, such as abnormal amounts of bleeding, swelling, significant bruising, significant pain, or evidence of infection, please call the office immediately.

## 2019-10-13 ENCOUNTER — Other Ambulatory Visit
Admission: RE | Admit: 2019-10-13 | Discharge: 2019-10-13 | Disposition: A | Discharge status: Home / Self Care | Payer: Medicare Other | Source: Ambulatory Visit | Attending: Physician Assistant | Admitting: Physician Assistant

## 2019-10-13 DIAGNOSIS — R05 Cough: Secondary | ICD-10-CM | POA: Insufficient documentation

## 2019-10-13 DIAGNOSIS — R509 Fever, unspecified: Secondary | ICD-10-CM | POA: Diagnosis present

## 2019-10-13 DIAGNOSIS — R0789 Other chest pain: Secondary | ICD-10-CM | POA: Diagnosis not present

## 2019-10-13 LAB — FIBRIN DERIVATIVES D-DIMER (ARMC ONLY): Fibrin derivatives D-dimer (ARMC): 474.41 ng/mL (FEU) (ref 0.00–499.00)

## 2019-10-19 ENCOUNTER — Other Ambulatory Visit: Payer: Self-pay

## 2019-10-19 ENCOUNTER — Ambulatory Visit
Admission: RE | Admit: 2019-10-19 | Discharge: 2019-10-19 | Disposition: A | Discharge status: Home / Self Care | Payer: Medicare Other | Source: Ambulatory Visit | Attending: Hematology and Oncology | Admitting: Hematology and Oncology

## 2019-10-19 DIAGNOSIS — Z1231 Encounter for screening mammogram for malignant neoplasm of breast: Secondary | ICD-10-CM | POA: Diagnosis present

## 2019-10-19 DIAGNOSIS — C50912 Malignant neoplasm of unspecified site of left female breast: Secondary | ICD-10-CM

## 2019-10-20 ENCOUNTER — Telehealth: Payer: Self-pay

## 2019-10-20 DIAGNOSIS — C44311 Basal cell carcinoma of skin of nose: Secondary | ICD-10-CM

## 2019-10-20 NOTE — Telephone Encounter (Signed)
Advised pt of bx results.  Advised pt Dr. Nicole Kindred recommends [facility].  Advised pt we would send referral to Dr. Lacinda Axon today and then Dr. Jonathon Jordan office will call her with appointment./sh

## 2019-10-20 NOTE — Telephone Encounter (Signed)
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

## 2019-10-26 ENCOUNTER — Ambulatory Visit: Payer: Medicare Other

## 2019-11-07 ENCOUNTER — Ambulatory Visit: Payer: Medicare Other | Admitting: Surgery

## 2019-11-08 ENCOUNTER — Ambulatory Visit: Payer: Medicare Other | Admitting: Dermatology

## 2019-11-09 ENCOUNTER — Ambulatory Visit (INDEPENDENT_AMBULATORY_CARE_PROVIDER_SITE_OTHER): Payer: Medicare Other | Admitting: Dermatology

## 2019-11-09 ENCOUNTER — Other Ambulatory Visit: Payer: Self-pay

## 2019-11-09 ENCOUNTER — Other Ambulatory Visit: Payer: Self-pay | Admitting: Dermatology

## 2019-11-09 DIAGNOSIS — D492 Neoplasm of unspecified behavior of bone, soft tissue, and skin: Secondary | ICD-10-CM

## 2019-11-09 DIAGNOSIS — C44311 Basal cell carcinoma of skin of nose: Secondary | ICD-10-CM | POA: Diagnosis not present

## 2019-11-09 DIAGNOSIS — L720 Epidermal cyst: Secondary | ICD-10-CM

## 2019-11-09 DIAGNOSIS — L820 Inflamed seborrheic keratosis: Secondary | ICD-10-CM

## 2019-11-09 DIAGNOSIS — L700 Acne vulgaris: Secondary | ICD-10-CM

## 2019-11-09 NOTE — Patient Instructions (Addendum)
Start Differin 0.1% Gel (OTC) Apply a pea-sized amount to face every night for breakouts as tolerated.   Wound Care Instructions  1. Cleanse wound gently with soap and water once a day then pat dry with clean gauze. Apply a thing coat of Petrolatum (petroleum jelly, "Vaseline") over the wound (unless you have an allergy to this). We recommend that you use a new, sterile tube of Vaseline. Do not pick or remove scabs. Do not remove the yellow or white "healing tissue" from the base of the wound.  2. Cover the wound with fresh, clean, nonstick gauze and secure with paper tape. You may use Band-Aids in place of gauze and tape if the would is small enough, but would recommend trimming much of the tape off as there is often too much. Sometimes Band-Aids can irritate the skin.  3. You should call the office for your biopsy report after 1 week if you have not already been contacted.  4. If you experience any problems, such as abnormal amounts of bleeding, swelling, significant bruising, significant pain, or evidence of infection, please call the office immediately.    Cryotherapy Aftercare  . Wash gently with soap and water everyday.   Marland Kitchen Apply Vaseline and Band-Aid daily until healed.

## 2019-11-09 NOTE — Progress Notes (Signed)
   Follow-Up Visit   Subjective  Mariah Hogan is a 69 y.o. female who presents for the following: Scaly spot (left medial inferior knee, present for a while. She has knee replacement surgery coming up in the future and would like it checked before then.) and Growth (left post scalp, growing, hits with comb). She also has some breaking out on her left face recently.  The following portions of the chart were reviewed this encounter and updated as appropriate:      Review of Systems:  No other skin or systemic complaints except as noted in HPI or Assessment and Plan.  Objective  Well appearing patient in no apparent distress; mood and affect are within normal limits.  A focused examination was performed including scalp, leg. Relevant physical exam findings are noted in the Assessment and Plan.  Objective  Face: Erythematous papules L > R cheek  Objective  Left Medial Knee: Keratotic tan waxy macule   Objective  Left Occipital Hairline: 5.44m flesh papule  Objective  Left Nasal Tip: Pink biopsy site.   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  Inflamed seborrheic keratosis Left Medial Knee  Destruction of lesion - Left Medial Knee  Destruction method: cryotherapy   Informed consent: discussed and consent obtained   Lesion destroyed using liquid nitrogen: Yes   Region frozen until ice ball extended beyond lesion: Yes   Outcome: patient tolerated procedure well with no complications   Post-procedure details: wound care instructions given    Neoplasm of skin Left Occipital Hairline  Epidermal / dermal shaving  Lesion diameter (cm):  0.5 Informed consent: discussed and consent obtained   Patient was prepped and draped in usual sterile fashion: Area prepped with alcohol. Anesthesia: the lesion was anesthetized in a standard fashion   Anesthetic:  1% lidocaine w/ epinephrine  1-100,000 buffered w/ 8.4% NaHCO3 Instrument used: flexible razor blade   Hemostasis achieved with: pressure, aluminum chloride and electrodesiccation   Outcome: patient tolerated procedure well   Post-procedure details: wound care instructions given   Post-procedure details comment:  Ointment and small bandage applied Additional details:  Post shave defect 0.6 cm  Specimen 1 - Surgical pathology Differential Diagnosis: Irritated Nevus vs other Check Margins: Yes 5.059mflesh papule  Irritated nevus vrs other  Basal cell carcinoma (BCC) of skin of nose Left Nasal Tip  Biopsy proven.  Patient is scheduled for MOHS with Dr CoLacinda Axonext Thursday, September 9 at 7:30pm.  Return as scheduled with Dr K.Dara LordsMeJamesetta OrleansCMA, am acting as scribe for TaBrendolyn PattyMD .  Documentation: I have reviewed the above documentation for accuracy and completeness, and I agree with the above.  TaBrendolyn PattyD

## 2019-11-15 ENCOUNTER — Telehealth: Payer: Self-pay

## 2019-11-15 NOTE — Telephone Encounter (Signed)
-----   Message from Brendolyn Patty, MD sent at 11/15/2019  8:55 AM EDT ----- Skin , (A) left occipital hairline EPIDERMOID CYST, INFLAMED AND DISRUPTED

## 2019-11-15 NOTE — Telephone Encounter (Signed)
Lft pt msg advising bx benign cyst.  Advised if any questions to call the office./sh

## 2019-11-17 DIAGNOSIS — Z85828 Personal history of other malignant neoplasm of skin: Secondary | ICD-10-CM | POA: Insufficient documentation

## 2019-12-14 ENCOUNTER — Ambulatory Visit: Payer: Medicare Other | Admitting: Hematology and Oncology

## 2019-12-14 ENCOUNTER — Other Ambulatory Visit: Payer: Medicare Other

## 2019-12-27 NOTE — Progress Notes (Signed)
[facility]  [address], [address] [place], [place] [address] Phone: [phone]  Fax: [phone]   Clinic Day:  12/28/2019  Referring physician: Marinda Elk, MD  Chief Complaint: Mariah Hogan is a 69 y.o. female with clinical stage IA left breast cancerwho is seen for 4 month assessment.   HPI: The patient was last seen in the medical oncology clinic on 08/11/2019. At that time, she denied any breast concerns.  She was anticipating a knee replacement.  Exam was stable. Hematocrit was 40.2, hemoglobin 13.8, platelets 217,000, WBC 5,200.  Creatinine was 1.06 (CrCl 53 ml/min). Calcium was 8.6. CA27.29 was 16.7. She wanted to try Arimidex.  We discussed plan for Prolia if bone density declines.  Bone density on 08/23/2019 revealed osteopenia with a T score of -2.0 at the left femur neck.  Screening right mammogram on 10/19/2019 revealed no evidence of malignancy.  The patient underwent Moh's surgery by Dr. Lacinda Axon on 11/17/2019 for basal cell carcinoma on the left nasal tip.  The patient saw Dr. Bary Castilla on 10/25/2019. There was no evidence of recurrent cancer. Follow up was planned for 1 year.  The patient established care with Dr. Lanney Gins on 11/24/2019. She had a cough for over 3 months and was coughing up greenish phlegm. She was s/p two bursts of antibiotics. She was prescribed Carafate and Bactrim x 7 days.  During the interim, she has been "ok".  She went for her pre-op testing for knee surgery on 10/02/2019 was found to have pneumonia. She had fevers and a cough. She received 3 rounds of antibiotics. She has not rescheduled her knee replacement yet. They want her to stop her endocrine therapy at least 2 weeks prior to her surgery.  She has broken 3 teeth since her last visit. One of her teeth broke while she was brushing her teeth and another broke while she was eating stew. She has never had problems with her teeth and flosses and brushes  regularly.  She denies any breast concerns. She tried anastrozole for 30 days and really could not tell a difference compared to Femara. She still had back and joint pain. She had a little bit more diarrhea and hair thinning on Anastrozole. She is not taking it anymore because she thought she was going to have her knee replacement. She has never taken tamoxifen.  She takes calcium and vitamin D but is not sure of the dose.  Her younger sister had breast cancer 6-7 years ago.   Past Medical History:  Diagnosis Date  . Anemia   . Arthritis   . Asthma    H/O YEARS AGO-NOW HAS COUGHING SPELLS WHICH IS WHY SHE HAS HER ALBUTEROL INHALER  . Basal cell carcinoma    R nose ([facility])  . Basal cell carcinoma 01/29/2016   Right forehead above med brow  . BRCA gene mutation negative 08/12/2017   Variant of unknown significance at [facility] only abnormality.  . Breast cancer ([place]) 08/04/2017   1.8 cm ER 90%, PR 20%, HER-2/neu negative invasive mammary carcinoma of the left upper outer quadrant.  MammaPrint: High risk.  . Cancer ([place])    [phone] basal cell carcinoma  . Complication of anesthesia    HARD TIME WAKING UP  . Dysplastic nevus 02/21/2010   Right mid pretibial, mild  . Dysrhythmia    TACHYCARDIA  . GERD (gastroesophageal reflux disease)   . History of hiatal hernia    SMALL  . Hyperlipidemia   . Hypertension  PT STATES IN 01-18-18 PREOP PHONE INTERVIEW THAT HER BP HAS BEEN ELEVATED SINCE STARTING ON VALSARTAN- PCP SWITCHED PT BACK TO MAXIDE ON 01-18-18 AND PT STATES THAT SHE HAS LOST 6-10 POUNDS OF FLUID SINCE RESTARTING MAXIDE.    Marland Kitchen Personal history of chemotherapy   . PONV (postoperative nausea and vomiting)     Past Surgical History:  Procedure Laterality Date  . ABDOMINAL HYSTERECTOMY  1998  . bladder tack  1191,4782  . BREAST BIOPSY Left 1990  . BREAST BIOPSY Left 2018   core bx- neg  . 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  Marland Kitchen  BREAST BIOPSY Right 08/2017   MRI biopsy, FIBROCYSTIC CHANGE AND USUAL DUCTAL HYPERPLASIA,, clip placed  . BREAST CYST EXCISION  x3  . BREAST EXCISIONAL BIOPSY Bilateral   . BREAST RECONSTRUCTION WITH PLACEMENT OF TISSUE EXPANDER AND FLEX HD (ACELLULAR HYDRATED DERMIS) Left 01/27/2018   Procedure: BREAST RECONSTRUCTION WITH PLACEMENT OF TISSUE EXPANDER AND FLEX HD (ACELLULAR HYDRATED DERMIS);  Surgeon: Wallace Going, DO;  Location: [facility] ORS;  Service: Plastics;  Laterality: Left;  . BREAST SURGERY Left 02/13/14   Fibrocystic changes, pseudo-angiomatous stromal hyperplasia. No atypia or malignancy.  . CARPAL TUNNEL RELEASE Bilateral x2  . CHOLECYSTECTOMY  2008  . COLONOSCOPY WITH PROPOFOL N/A 01/12/2015   Procedure: COLONOSCOPY WITH PROPOFOL;  Surgeon: Manya Silvas, MD;  Location: [facility] ENDOSCOPY;  Service: Endoscopy;  Laterality: N/A;  . ESOPHAGOGASTRODUODENOSCOPY (EGD) WITH PROPOFOL N/A 01/12/2015   Procedure: ESOPHAGOGASTRODUODENOSCOPY (EGD) WITH PROPOFOL;  Surgeon: Manya Silvas, MD;  Location: [facility] ENDOSCOPY;  Service: Endoscopy;  Laterality: N/A;  . LIPOSUCTION WITH LIPOFILLING Left 12/23/2018   Procedure: LIPOSUCTION WITH LIPOFILLING FROM ABDOMEN TO LEFT BREAST;  Surgeon: Wallace Going, DO;  Location: [facility];  Service: Plastics;  Laterality: Left;  90 min, please  . MASTECTOMY Left 2019   [place]  . MASTECTOMY W/ SENTINEL NODE BIOPSY Left 01/27/2018   ypT1c ypN0; ER 90%, PR 20%, HER-2/neu not overexpressed.  MASTECTOMY WITH SENTINEL LYMPH NODE BIOPSY;  Surgeon: Robert Bellow, MD;  Location: [facility] ORS;  Service: General;  Laterality: Left;  . MINOR BREAST BIOPSY Right 09/02/2017   Radiology perform procedure, fibrocystic changes right retroareolar area.  Marland Kitchen NASAL RECONSTRUCTION  1983  . OOPHORECTOMY    . PORT-A-CATH REMOVAL    . PORTACATH PLACEMENT N/A 10/21/2017   Procedure: INSERTION PORT-A-CATH;  Surgeon: Robert Bellow, MD;  Location: [facility] ORS;   Service: General;  Laterality: N/A;  . REMOVAL OF TISSUE EXPANDER AND PLACEMENT OF IMPLANT Left 05/13/2018   Procedure: REMOVAL OF TISSUE EXPANDER AND PLACEMENT OF IMPLANT;  Surgeon: Wallace Going, DO;  Location: [facility] ORS;  Service: Plastics;  Laterality: Left;  total surgery time should be 3 hours, per provider  . SAVORY DILATION N/A 01/12/2015   Procedure: SAVORY DILATION;  Surgeon: Manya Silvas, MD;  Location: [facility] ENDOSCOPY;  Service: Endoscopy;  Laterality: N/A;  . TONSILLECTOMY AND ADENOIDECTOMY  1956    Family History  Problem Relation Age of Onset  . Breast cancer Sister 92       currently 79  . Breast cancer Paternal Grandmother 46       bilateral; deceased 40s   . Breast cancer Cousin 22       daughter of an unaffected maternal aunt  . Breast cancer Cousin 50       kidney cancer also; daughter of an unaffected maternal aunt  .  Other Mother        TAH/BSO prior to menopause  . Prostate cancer Paternal Grandfather        age at dx unknown  . Breast cancer Maternal Aunt        age at dx unknown  . Breast cancer Cousin 60       daughter of an unaffected maternal aunt    Social History:  reports that she has never smoked. She has never used smokeless tobacco. She reports that she does not drink alcohol and does not use drugs. Her daughter, Mariah Hogan, is a gastroenterology PA. She is a retired Customer service manager for 35 years. Patient denies known exposures to radiation on toxins. She has a new 8 month old grandson named Charles. She has another 66 year old grandchild. The patient is alone today.  Allergies:  Allergies  Allergen Reactions  . Penicillin G Shortness Of Breath, Swelling, Rash and Other (See Comments)    Has patient had a PCN reaction causing immediate rash, facial/tongue/throat swelling, SOB or lightheadedness with hypotension: Yes Has patient had a PCN reaction causing severe rash involving mucus membranes or skin necrosis: No Has patient had a PCN reaction that  required hospitalization: No Has patient had a PCN reaction occurring within the last 10 years: No If all of the above answers are "NO", then may proceed with Cephalosporin use.  [name] [Docetaxel] Shortness Of Breath, Palpitations and Other (See Comments)    Back pain, Back Spasms  . Other Other (See Comments)    Dodecyl gallate: Positive patch test  Elta MD UV Clear SPF 46: Positive patch test  fragrance   . Parabens Other (See Comments)    Positive patch test   . Shellac Other (See Comments)    Positive patch test   . Ciprofloxacin Hives, Itching and Rash    rash  . Fluocinonide Rash  . Tape Dermatitis    Paper tape is ok, tegaderm OK   . Triamcinolone Rash    Current Medications: Current Outpatient Medications  Medication Sig Dispense Refill  . atenolol (TENORMIN) 50 MG tablet Take 50 mg by mouth every morning.     . Calcium Carbonate (CALCIUM 500 PO) Take 1 tablet by mouth daily.    . cetirizine (ZYRTEC) 10 MG tablet Take 10 mg by mouth daily.     . Cholecalciferol (VITAMIN D3 PO) Take 1 tablet by mouth daily.    . citalopram (CELEXA) 20 MG tablet Take 20 mg by mouth at bedtime.     . Multiple Vitamin (MULTIVITAMIN WITH MINERALS) TABS tablet Take 1 tablet by mouth 4 (four) times a week.    . pantoprazole (PROTONIX) 40 MG tablet Take 40 mg by mouth 2 (two) times daily as needed (heartburn).     Marland Kitchen PREMARIN vaginal cream Place 1 Applicatorful vaginally once a week.     . triamterene-hydrochlorothiazide (MAXZIDE-25) 37.5-25 MG tablet Take 0.5 tablets by mouth daily.     . valACYclovir (VALTREX) 1000 MG tablet Take 1,000 mg by mouth daily.     Marland Kitchen anastrozole (ARIMIDEX) 1 MG tablet Take 1 tablet (1 mg total) by mouth daily. (Patient not taking: Reported on 12/28/2019) 30 tablet 0  . Calcium Carb-Cholecalciferol (CALTRATE 600+D3 PO) Take 1 tablet by mouth daily. (Patient not taking: Reported on 12/28/2019)    . Cholecalciferol (VITAMIN D) 50 MCG (2000 UT) CAPS Take 2,000 Units  by mouth daily. (Patient not taking: Reported on 12/28/2019)    . Desoximetasone 0.05 % OINT  Apply 1 application topically 2 (two) times daily as needed (rash).  (Patient not taking: Reported on 12/28/2019)    . meloxicam (MOBIC) 15 MG tablet Take 15 mg by mouth daily. (Patient not taking: Reported on 12/28/2019)     No current facility-administered medications for this visit.    Review of Systems  Constitutional: Negative.  Negative for chills, diaphoresis, fever, malaise/fatigue and weight loss (up 14 lbs).       Doing "okay."  HENT: Negative for congestion, ear discharge, ear pain, hearing loss, nosebleeds, sinus pain, sore throat and tinnitus.        3 broken teeth  Eyes: Negative.  Negative for blurred vision.       Slight cataracts.  Respiratory: Negative.  Negative for cough, hemoptysis, sputum production, shortness of breath and wheezing.   Cardiovascular: Negative.  Negative for chest pain, palpitations, orthopnea, leg swelling and PND.  Gastrointestinal: Negative for abdominal pain, blood in stool, constipation, diarrhea, heartburn, melena, nausea and vomiting.  Genitourinary: Negative.  Negative for dysuria, frequency, hematuria and urgency.  Musculoskeletal: Negative.  Negative for back pain, falls, joint pain, myalgias and neck pain.       Planning knee replacement.  Skin: Negative.  Negative for itching and rash.  Neurological: Negative.  Negative for dizziness, tingling, sensory change, weakness and headaches.  Endo/Heme/Allergies: Negative.  Does not bruise/bleed easily.  Psychiatric/Behavioral: Negative.  Negative for depression and memory loss. The patient is not nervous/anxious and does not have insomnia.   All other systems reviewed and are negative.  Performance status (ECOG): 0  Vitals Blood pressure 116/67, pulse 92, temperature 97.7 F (36.5 C), temperature source Tympanic, resp. rate 18, weight 176 lb 5.9 oz (80 kg), SpO2 97 %.   Physical Exam Vitals and  nursing note reviewed.  Constitutional:      General: She is not in acute distress.    Appearance: She is well-developed. She is not diaphoretic.  HENT:     Head: Normocephalic and atraumatic.     Comments: Long shoulder length gray hair.    Mouth/Throat:     Mouth: Mucous membranes are moist.     Pharynx: Oropharynx is clear. No oropharyngeal exudate.  Eyes:     General: No scleral icterus.    Extraocular Movements: Extraocular movements intact.     Conjunctiva/sclera: Conjunctivae normal.     Pupils: Pupils are equal, round, and reactive to light.     Comments: Blue eyes.  Neck:     Vascular: No JVD.  Cardiovascular:     Rate and Rhythm: Normal rate and regular rhythm.     Heart sounds: Normal heart sounds. No murmur heard.   Pulmonary:     Effort: Pulmonary effort is normal. No respiratory distress.     Breath sounds: Normal breath sounds. No wheezing or rales.  Chest:     Chest wall: No tenderness.     Breasts:        Right: Skin change (fibrocystic changes) present. No inverted nipple, mass, nipple discharge or tenderness.        Left: No inverted nipple, mass, nipple discharge, skin change or tenderness.     Comments: Left implant.  No erythema or nodularity. Abdominal:     General: Bowel sounds are normal. There is no distension.     Palpations: Abdomen is soft. There is no mass.     Tenderness: There is no abdominal tenderness. There is no guarding or rebound.  Musculoskeletal:  General: No swelling or tenderness. Normal range of motion.     Cervical back: Normal range of motion and neck supple.  Lymphadenopathy:     Head:     Right side of head: No preauricular, posterior auricular or occipital adenopathy.     Left side of head: No preauricular, posterior auricular or occipital adenopathy.     Cervical: No cervical adenopathy.     Upper Body:     Right upper body: No supraclavicular or axillary adenopathy.     Left upper body: No supraclavicular or axillary  adenopathy.     Lower Body: No right inguinal adenopathy. No left inguinal adenopathy.  Skin:    General: Skin is warm and dry.     Coloration: Skin is not pale.     Findings: No erythema or rash.  Neurological:     Mental Status: She is alert and oriented to person, place, and time.     Deep Tendon Reflexes: Reflexes are normal and symmetric.  Psychiatric:        Behavior: Behavior normal.        Thought Content: Thought content normal.        Judgment: Judgment normal.    Appointment on 12/28/2019  Component Date Value Ref Range Status  . Sodium 12/28/2019 136  135 - 145 mmol/L Final  . Potassium 12/28/2019 3.9  3.5 - 5.1 mmol/L Final  . Chloride 12/28/2019 101  98 - 111 mmol/L Final  . CO2 12/28/2019 27  22 - 32 mmol/L Final  . Glucose, Bld 12/28/2019 144* 70 - 99 mg/dL Final   Glucose reference range applies only to samples taken after fasting for at least 8 hours.  . BUN 12/28/2019 19  8 - 23 mg/dL Final  . Creatinine, Ser 12/28/2019 1.13* 0.44 - 1.00 mg/dL Final  . Calcium 12/28/2019 8.6* 8.9 - 10.3 mg/dL Final  . Total Protein 12/28/2019 7.1  6.5 - 8.1 g/dL Final  . Albumin 12/28/2019 3.9  3.5 - 5.0 g/dL Final  . AST 12/28/2019 21  15 - 41 U/L Final  . ALT 12/28/2019 19  0 - 44 U/L Final  . Alkaline Phosphatase 12/28/2019 84  38 - 126 U/L Final  . Total Bilirubin 12/28/2019 0.8  0.3 - 1.2 mg/dL Final  . GFR, Estimated 12/28/2019 49* >60 mL/min Final  . Anion gap 12/28/2019 8  5 - 15 Final   Performed at [facility], [address], [place], [place] [address]  . WBC 12/28/2019 5.2  4.0 - 10.5 K/uL Final  . RBC 12/28/2019 4.54  3.87 - 5.11 MIL/uL Final  . Hemoglobin 12/28/2019 14.4  12.0 - 15.0 g/dL Final  . HCT 12/28/2019 41.6  36 - 46 % Final  . MCV 12/28/2019 91.6  80.0 - 100.0 fL Final  . MCH 12/28/2019 31.7  26.0 - 34.0 pg Final  . MCHC 12/28/2019 34.6  30.0 - 36.0 g/dL Final  . RDW 12/28/2019 11.9  11.5 - 15.5 % Final  . Platelets 12/28/2019 211   150 - 400 K/uL Final  . nRBC 12/28/2019 0.0  0.0 - 0.2 % Final  . Neutrophils Relative % 12/28/2019 69  % Final  . Neutro Abs 12/28/2019 3.6  1.7 - 7.7 K/uL Final  . Lymphocytes Relative 12/28/2019 21  % Final  . Lymphs Abs 12/28/2019 1.1  0.7 - 4.0 K/uL Final  . Monocytes Relative 12/28/2019 6  % Final  . Monocytes Absolute 12/28/2019 0.3  0.1 - 1.0 K/uL  Final  . Eosinophils Relative 12/28/2019 3  % Final  . Eosinophils Absolute 12/28/2019 0.1  0.0 - 0.5 K/uL Final  . Basophils Relative 12/28/2019 1  % Final  . Basophils Absolute 12/28/2019 0.0  0.0 - 0.1 K/uL Final  . Immature Granulocytes 12/28/2019 0  % Final  . Abs Immature Granulocytes 12/28/2019 0.02  0.00 - 0.07 K/uL Final   Performed at [facility], [address], [place], [place] [address]    Assessment:  AVIELA BLUNDELL is a 68 y.o. female with clinical stage IA left breast cancers/p neoadjuvant chemotherapy followed by mastectomy,sentinel lymph node biopsy, and breast reconstruction with placement of a tissue expander on 01/27/2018. Pathologyrevealed a 1.1 cm grade II invasive mammary carcinoma of no special type. There was no lymphovascular invasion. 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 Margins were negative. There were 2 negative sentinel lymph nodes. Pathologic stagewas ypT1c ypN0.  Original breast biopsy on 08/04/2017 revealed at least grade II invasive ductal carcinoma with DCIS. Tumor was ER + (95%), PR+ (20%), and Her 2 neu - by FISH. Ki67 was 80%.   MammaPrintwas high risk luminal-type B with a 10 year risk of recurrence untreated of 29% and 94.6% of distant metastasis free interval with chemotherapy + hormonal therapy.  She receivedneoadjuvant Femara(08/12/2017 - 08/31/2017)secondary to planned delay in surgery (mastectomy with immediate reconstruction). Femara was discontinued after high risk Mammaprint returned. She received 1 cycle of neoadjuvant Taxotere and Cytoxan(09/03/2017). Cycle #2 on  10/01/2017 was truncated secondary to a Taxotere infusion reaction (back pain, SOB, shaking). She received 3 cycles ofneoadjuvant AC (10/27/2017 - 12/08/2017) with Udencya support.  She underwent left breast lipofilling for symmetry on 12/23/2018.  She began [facility] 02/09/2018. She stopped Femara from the end of 05/2019 - 08/11/2019.  She tried Arimidex for 1 month but did not notice a difference.  She is held endocrine therapy in anticipation of a knee replacement.  CA27.29 has been followed: 17.3 on 08/06/2017, 17.0 on 06/09/2018, 25.2 on 08/31/2018, 18.4 on 12/06/2018, 21.4 on 04/04/2019, 16.7 on 08/11/2019, and 13.2 on 12/28/2019.  Bilateral breast MRIon 08/20/2017 revealed a 2.4 cm enhancing irregular mass in the outer left breast. There were several prominent lymph nodes with borderline thicked cortices between 3-4 mm, but normal morphology. There was a 5 mm enhancing mass in the anterior, retroareolar region of the right breast. RIGHT breast retroareolar biopsyon 09/02/2017 revealed no evidence of malignancy. There was fibrocystic change and usual ductal hyperplasia.  Diagnostic right mammogram and ultrasound on 10/18/2018 revealed no suspicious mass, malignant type microcalcifications or distortion detected in the right breast. Spot tangential view of the area of clinical concern in the right breast showed normal fibroglandular tissue. There was no solid or cystic mass, abnormal shadowing or distortion visualized.  Bilateral breast MRI on 11/09/2018 revealed left mastectomy with implant reconstruction.  There was no suspicious enhancement.  Right breast was negative.  Screening right mammogram on 10/19/2019 revealed no evidence of malignancy.  She underwent left breast lipofilling for symmetry on 12/23/2018.    Chest CTon 09/23/2016 revealed a 4 mm RUL subpleural nodule. Chest CTon 08/17/2017 revealed a small 4 mm RUL nodule unchanged in size.  [place] 10/14/2017 revealed  an EF of 55-60%.  Bone densityon 08/17/2017 revealed osteopeniawith a T-score of -1.5 in the left femoral neck and -0.7 in the AP spine L1-L4.  Bone density on 08/23/2019 revealed osteopenia with a T score of -2.0 at the left femur neck.  She has a significant family history  of breast cancer(paternal grandmother, sister, and 3 maternal cousins). 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  She was admitted to [facility] 09/11/2017 - 09/15/2017 with enteropathogenic E.Coli (EPEC)and associated acute renal failure(creatinine 1.87; CrCl 28.3 ml/min). She was treated with intravenous azithromycin and ceftriaxone.  She received her last COVID-19 vaccine at the end of 06/2019.  Symptomatically, she feels "ok".  She has received 3 rounds of antibiotics for pneumonia.  She has had 3 broken teeth since her last visit. She denies any breast concerns. She stopped endocrine therapy in anticipation of knee surgery.  Exam is stable.  She tried anastrozole for 30 days and really could not tell a difference compared to Femara. She still had back and joint pain. She had a little bit more diarrhea and hair thinning on Anastrozole. She is not taking it anymore because she thought she was going to have her knee replacement. She has never taken tamoxifen.  Plan: 1.   Labs today: CBC with diff, CMP, CA 27.29 2.Stage IA left breast cancer Clinically, she is doing well. Exam reveals no evidence of recurrent disease. She is s/p neoadjuvant chemotherapy followed by mastectomy. Screening right mammogram on 10/19/2019 revealed no evidence of malignancy. She is s/p left breast lipofill on 12/23/2018. She is currently off endocrine therapy. She tried Arimidex and felt similar symptoms to Femara. Discuss importance of endocrine therapy. Encourage patient to find out about date of knee replacement for 60-week-old off therapy  pre-surgery rather than indefinite hold. Patient interested in the absolute benefit of endocrine therapy (mathematical model). 3. Osteopenia She is on calcium and vitamin D.  Bone density on 08/23/2019.             Patient is interested in Prolia if bone density declines. 4.   Call patient with recurrence risk. 5.   Patient awaiting date for knee surgery prior to restarting endocrine therapy. 6.   RTC in 6 months for MD assessment and labs (CBC with diff, CMP, CA 27.29).  I discussed the assessment and treatment plan with the patient.  The patient was provided an opportunity to ask questions and all were answered.  The patient agreed with the plan and demonstrated an understanding of the instructions.  The patient was advised to call back if the symptoms worsen or if the condition fails to improve as anticipated.  I provided 23 minutes of face-to-face time during this this encounter and > 50% was spent counseling as documented under my assessment and plan.  An additional 10 minutes were spent reviewing her chart (Epic and Care Everywhere) including notes, labs, and imaging studies.    Lequita Asal, MD, PhD    12/28/2019, 3:59 PM  I, Selena Batten, am acting as scribe for [employer]. Mike Gip, MD, PhD.  I, Jasemine Nawaz C. Mike Gip, MD, have reviewed the above documentation for accuracy and completeness, and I agree with the above.

## 2019-12-28 ENCOUNTER — Encounter: Payer: Self-pay | Admitting: Hematology and Oncology

## 2019-12-28 ENCOUNTER — Inpatient Hospital Stay (HOSPITAL_BASED_OUTPATIENT_CLINIC_OR_DEPARTMENT_OTHER): Payer: Medicare Other | Admitting: Hematology and Oncology

## 2019-12-28 ENCOUNTER — Inpatient Hospital Stay: Payer: Medicare Other | Attending: Hematology and Oncology

## 2019-12-28 ENCOUNTER — Other Ambulatory Visit: Payer: Self-pay

## 2019-12-28 VITALS — BP 116/67 | HR 92 | Temp 97.7°F | Resp 18 | Wt 176.4 lb

## 2019-12-28 DIAGNOSIS — M8580 Other specified disorders of bone density and structure, unspecified site: Secondary | ICD-10-CM | POA: Insufficient documentation

## 2019-12-28 DIAGNOSIS — C50412 Malignant neoplasm of upper-outer quadrant of left female breast: Secondary | ICD-10-CM | POA: Diagnosis not present

## 2019-12-28 DIAGNOSIS — Z170 Estrogen receptor positive status [ER+]: Secondary | ICD-10-CM | POA: Insufficient documentation

## 2019-12-28 DIAGNOSIS — M85852 Other specified disorders of bone density and structure, left thigh: Secondary | ICD-10-CM | POA: Diagnosis not present

## 2019-12-28 DIAGNOSIS — C50912 Malignant neoplasm of unspecified site of left female breast: Secondary | ICD-10-CM

## 2019-12-28 LAB — CBC WITH DIFFERENTIAL/PLATELET
Abs Immature Granulocytes: 0.02 10*3/uL (ref 0.00–0.07)
Basophils Absolute: 0 10*3/uL (ref 0.0–0.1)
Basophils Relative: 1 %
Eosinophils Absolute: 0.1 10*3/uL (ref 0.0–0.5)
Eosinophils Relative: 3 %
HCT: 41.6 % (ref 36.0–46.0)
Hemoglobin: 14.4 g/dL (ref 12.0–15.0)
Immature Granulocytes: 0 %
Lymphocytes Relative: 21 %
Lymphs Abs: 1.1 10*3/uL (ref 0.7–4.0)
MCH: 31.7 pg (ref 26.0–34.0)
MCHC: 34.6 g/dL (ref 30.0–36.0)
MCV: 91.6 fL (ref 80.0–100.0)
Monocytes Absolute: 0.3 10*3/uL (ref 0.1–1.0)
Monocytes Relative: 6 %
Neutro Abs: 3.6 10*3/uL (ref 1.7–7.7)
Neutrophils Relative %: 69 %
Platelets: 211 10*3/uL (ref 150–400)
RBC: 4.54 MIL/uL (ref 3.87–5.11)
RDW: 11.9 % (ref 11.5–15.5)
WBC: 5.2 10*3/uL (ref 4.0–10.5)
nRBC: 0 % (ref 0.0–0.2)

## 2019-12-28 LAB — COMPREHENSIVE METABOLIC PANEL
ALT: 19 U/L (ref 0–44)
AST: 21 U/L (ref 15–41)
Albumin: 3.9 g/dL (ref 3.5–5.0)
Alkaline Phosphatase: 84 U/L (ref 38–126)
Anion gap: 8 (ref 5–15)
BUN: 19 mg/dL (ref 8–23)
CO2: 27 mmol/L (ref 22–32)
Calcium: 8.6 mg/dL — ABNORMAL LOW (ref 8.9–10.3)
Chloride: 101 mmol/L (ref 98–111)
Creatinine, Ser: 1.13 mg/dL — ABNORMAL HIGH (ref 0.44–1.00)
GFR, Estimated: 49 mL/min — ABNORMAL LOW (ref >60)
Glucose, Bld: 144 mg/dL — ABNORMAL HIGH (ref 70–99)
Potassium: 3.9 mmol/L (ref 3.5–5.1)
Sodium: 136 mmol/L (ref 135–145)
Total Bilirubin: 0.8 mg/dL (ref 0.3–1.2)
Total Protein: 7.1 g/dL (ref 6.5–8.1)

## 2019-12-28 NOTE — Progress Notes (Signed)
Patient has recently had pneumonia (she still has some lingering cough), teeth breaking easily, and skin cancer removed from her nose. She had her knee surgery postponed due to the pneumonia. She has pain and tingling in her left knee.

## 2019-12-29 LAB — CANCER ANTIGEN 27.29: CA 27.29: 13.2 U/mL (ref 0.0–38.6)

## 2020-02-03 ENCOUNTER — Other Ambulatory Visit: Payer: Self-pay

## 2020-02-03 ENCOUNTER — Other Ambulatory Visit
Admission: RE | Admit: 2020-02-03 | Discharge: 2020-02-03 | Disposition: A | Discharge status: Home / Self Care | Payer: Medicare Other | Source: Ambulatory Visit | Attending: Gastroenterology | Admitting: Gastroenterology

## 2020-02-03 DIAGNOSIS — Z01812 Encounter for preprocedural laboratory examination: Secondary | ICD-10-CM | POA: Insufficient documentation

## 2020-02-03 DIAGNOSIS — Z20822 Contact with and (suspected) exposure to covid-19: Secondary | ICD-10-CM | POA: Diagnosis not present

## 2020-02-04 LAB — SARS CORONAVIRUS 2 (TAT 6-24 HRS): SARS Coronavirus 2: NEGATIVE

## 2020-02-06 ENCOUNTER — Encounter: Payer: Self-pay | Admitting: *Deleted

## 2020-02-06 ENCOUNTER — Other Ambulatory Visit: Payer: Self-pay

## 2020-02-06 ENCOUNTER — Encounter
Admission: RE | Disposition: A | Discharge status: Home / Self Care | Payer: Self-pay | Source: Home / Self Care | Attending: Gastroenterology

## 2020-02-06 ENCOUNTER — Ambulatory Visit
Admission: RE | Admit: 2020-02-06 | Discharge: 2020-02-06 | Disposition: A | Discharge status: Home / Self Care | Payer: Medicare Other | Attending: Gastroenterology | Admitting: Gastroenterology

## 2020-02-06 ENCOUNTER — Ambulatory Visit: Payer: Medicare Other | Admitting: Anesthesiology

## 2020-02-06 DIAGNOSIS — K222 Esophageal obstruction: Secondary | ICD-10-CM | POA: Insufficient documentation

## 2020-02-06 DIAGNOSIS — Z1211 Encounter for screening for malignant neoplasm of colon: Secondary | ICD-10-CM | POA: Insufficient documentation

## 2020-02-06 DIAGNOSIS — K317 Polyp of stomach and duodenum: Secondary | ICD-10-CM | POA: Insufficient documentation

## 2020-02-06 DIAGNOSIS — Z881 Allergy status to other antibiotic agents status: Secondary | ICD-10-CM | POA: Diagnosis not present

## 2020-02-06 DIAGNOSIS — R1310 Dysphagia, unspecified: Secondary | ICD-10-CM | POA: Insufficient documentation

## 2020-02-06 DIAGNOSIS — Z79899 Other long term (current) drug therapy: Secondary | ICD-10-CM | POA: Diagnosis not present

## 2020-02-06 DIAGNOSIS — Z888 Allergy status to other drugs, medicaments and biological substances status: Secondary | ICD-10-CM | POA: Diagnosis not present

## 2020-02-06 DIAGNOSIS — K449 Diaphragmatic hernia without obstruction or gangrene: Secondary | ICD-10-CM | POA: Insufficient documentation

## 2020-02-06 DIAGNOSIS — Z86010 Personal history of colonic polyps: Secondary | ICD-10-CM | POA: Insufficient documentation

## 2020-02-06 DIAGNOSIS — K219 Gastro-esophageal reflux disease without esophagitis: Secondary | ICD-10-CM | POA: Insufficient documentation

## 2020-02-06 DIAGNOSIS — Z880 Allergy status to penicillin: Secondary | ICD-10-CM | POA: Diagnosis not present

## 2020-02-06 DIAGNOSIS — K640 First degree hemorrhoids: Secondary | ICD-10-CM | POA: Diagnosis not present

## 2020-02-06 HISTORY — PX: COLONOSCOPY WITH PROPOFOL: SHX5780

## 2020-02-06 HISTORY — PX: ESOPHAGOGASTRODUODENOSCOPY (EGD) WITH PROPOFOL: SHX5813

## 2020-02-06 SURGERY — COLONOSCOPY WITH PROPOFOL
Anesthesia: General

## 2020-02-06 MED ORDER — PROPOFOL 500 MG/50ML IV EMUL
INTRAVENOUS | Status: AC
  Filled 2020-02-06: qty 50

## 2020-02-06 MED ORDER — PROPOFOL 10 MG/ML IV BOLUS
INTRAVENOUS | Status: DC | PRN
  Administered 2020-02-06: 50 mg via INTRAVENOUS

## 2020-02-06 MED ORDER — SODIUM CHLORIDE 0.9 % IV SOLN: INTRAVENOUS | Status: DC | PRN

## 2020-02-06 MED ORDER — PROPOFOL 500 MG/50ML IV EMUL
INTRAVENOUS | Status: DC | PRN
  Administered 2020-02-06: 140 ug/kg/min via INTRAVENOUS

## 2020-02-06 NOTE — Interval H&P Note (Signed)
History and Physical Interval Note:  02/06/2020 12:25 PM  Mariah Hogan  has presented today for surgery, with the diagnosis of GERD DYSPHAGIA CHRONIC DIARRHEA PRS HX COLON POLYPS.  The various methods of treatment have been discussed with the patient and family. After consideration of risks, benefits and other options for treatment, the patient has consented to  Procedure(s): COLONOSCOPY WITH PROPOFOL (N/A) ESOPHAGOGASTRODUODENOSCOPY (EGD) WITH PROPOFOL (N/A) as a surgical intervention.  The patient's history has been reviewed, patient examined, no change in status, stable for surgery.  I have reviewed the patient's chart and labs.  Questions were answered to the patient's satisfaction.     Lesly Rubenstein  Ok to proceed with EGD/Colonoscopy

## 2020-02-06 NOTE — Op Note (Signed)
[facility] Gastroenterology Patient Name: Mariah Hogan Procedure Date: 02/06/2020 11:38 AM MRN: [id number] Account #: [id number] Date of Birth: Dec 17, 1950 Admit Type: Outpatient Age: 69 Room: [facility] ENDO ROOM 3 Gender: Female Note Status: Finalized Procedure:             Upper GI endoscopy Indications:           Dysphagia, Gastro-esophageal reflux disease Providers:             Andrey Farmer MD, MD Referring MD:          Precious Bard, MD (Referring MD) Medicines:             Monitored Anesthesia Care Complications:         No immediate complications. Estimated blood loss:                         Minimal. Procedure:             Pre-Anesthesia Assessment:                        - Prior to the procedure, a History and Physical was                         performed, and patient medications and allergies were                         reviewed. The patient is competent. The risks and                         benefits of the procedure and the sedation options and                         risks were discussed with the patient. All questions                         were answered and informed consent was obtained.                         Patient identification and proposed procedure were                         verified by the physician, the nurse, the anesthetist                         and the technician in the endoscopy suite. Mental                         Status Examination: alert and oriented. Airway                         Examination: normal oropharyngeal airway and neck                         mobility. Respiratory Examination: clear to                         auscultation. CV Examination: normal. Prophylactic  Antibiotics: The patient does not require prophylactic                         antibiotics. Prior Anticoagulants: The patient has                         taken no previous anticoagulant or antiplatelet                          agents. ASA Grade Assessment: II - A patient with mild                         systemic disease. After reviewing the risks and                         benefits, the patient was deemed in satisfactory                         condition to undergo the procedure. The anesthesia                         plan was to use monitored anesthesia care (MAC).                         Immediately prior to administration of medications,                         the patient was re-assessed for adequacy to receive                         sedatives. The heart rate, respiratory rate, oxygen                         saturations, blood pressure, adequacy of pulmonary                         ventilation, and response to care were monitored                         throughout the procedure. The physical status of the                         patient was re-assessed after the procedure.                        After obtaining informed consent, the endoscope was                         passed under direct vision. Throughout the procedure,                         the patient's blood pressure, pulse, and oxygen                         saturations were monitored continuously. The Endoscope                         was introduced through the mouth, and advanced to the  second part of duodenum. The upper GI endoscopy was                         accomplished without difficulty. The patient tolerated                         the procedure well. Findings:      A non-obstructing Schatzki ring was found in the lower third of the       esophagus. A TTS dilator was passed through the scope. Dilation with a       15-16.5-18 mm balloon dilator was performed to 18 mm. The dilation site       was examined and showed mild mucosal disruption. Estimated blood loss       was minimal.      A 5 cm hiatal hernia was present.      Multiple sessile polyps with no stigmata of recent bleeding were found       in the entire  examined stomach. The polyp was removed with a cold snare.       Resection and retrieval were complete. Estimated blood loss was minimal.      The examined duodenum was normal. Impression:            - Non-obstructing Schatzki ring. Dilated.                        - 5 cm hiatal hernia.                        - Multiple gastric polyps. Resected and retrieved.                        - Normal examined duodenum. Recommendation:        - Discharge patient to home.                        - Resume previous diet.                        - Continue present medications.                        - Await pathology results.                        - Return to referring physician as previously                         scheduled. Procedure Code(s):     --- Professional ---                        [phone], Esophagogastroduodenoscopy, flexible,                         transoral; with removal of tumor(s), polyp(s), or                         other lesion(s) by snare technique                        [id number], Esophagogastroduodenoscopy, flexible,  transoral; with transendoscopic balloon dilation of                         esophagus (less than 30 mm diameter) Diagnosis Code(s):     --- Professional ---                        K22.2, Esophageal obstruction                        K44.9, Diaphragmatic hernia without obstruction or                         gangrene                        K31.7, Polyp of stomach and duodenum                        R13.10, Dysphagia, unspecified                        K21.9, Gastro-esophageal reflux disease without                         esophagitis CPT copyright 2019 American [employer] Association. All rights reserved. The codes documented in this report are preliminary and upon coder review may  be revised to meet current compliance requirements. Andrey Farmer, MD Andrey Farmer MD, MD 02/06/2020 1:08:13 PM Number of Addenda: 0 Note Initiated On: 02/06/2020  11:38 AM Estimated Blood Loss:  Estimated blood loss was minimal.      [facility]

## 2020-02-06 NOTE — Op Note (Signed)
[facility] Gastroenterology Patient Name: Mariah Hogan Procedure Date: 02/06/2020 11:37 AM MRN: [id number] Account #: [id number] Date of Birth: 1950/09/12 Admit Type: Outpatient Age: 69 Room: [facility] ENDO ROOM 3 Gender: Female Note Status: Finalized Procedure:             Colonoscopy Indications:           High risk colon cancer surveillance: Personal history                         of non-advanced adenoma Providers:             Andrey Farmer MD, MD Referring MD:          Precious Bard, MD (Referring MD) Medicines:             Monitored Anesthesia Care Complications:         No immediate complications. Procedure:             Pre-Anesthesia Assessment:                        - Prior to the procedure, a History and Physical was                         performed, and patient medications and allergies were                         reviewed. The patient is competent. The risks and                         benefits of the procedure and the sedation options and                         risks were discussed with the patient. All questions                         were answered and informed consent was obtained.                         Patient identification and proposed procedure were                         verified by the physician, the nurse, the anesthetist                         and the technician in the endoscopy suite. Mental                         Status Examination: alert and oriented. Airway                         Examination: normal oropharyngeal airway and neck                         mobility. Respiratory Examination: clear to                         auscultation. CV Examination: normal. Prophylactic  Antibiotics: The patient does not require prophylactic                         antibiotics. Prior Anticoagulants: The patient has                         taken no previous anticoagulant or antiplatelet                          agents. ASA Grade Assessment: II - A patient with mild                         systemic disease. After reviewing the risks and                         benefits, the patient was deemed in satisfactory                         condition to undergo the procedure. The anesthesia                         plan was to use monitored anesthesia care (MAC).                         Immediately prior to administration of medications,                         the patient was re-assessed for adequacy to receive                         sedatives. The heart rate, respiratory rate, oxygen                         saturations, blood pressure, adequacy of pulmonary                         ventilation, and response to care were monitored                         throughout the procedure. The physical status of the                         patient was re-assessed after the procedure.                        After obtaining informed consent, the colonoscope was                         passed under direct vision. Throughout the procedure,                         the patient's blood pressure, pulse, and oxygen                         saturations were monitored continuously. The                         Colonoscope was introduced through the anus and  advanced to the the cecum, identified by appendiceal                         orifice and ileocecal valve. The colonoscopy was                         performed without difficulty. The patient tolerated                         the procedure well. The quality of the bowel                         preparation was good. Findings:      The perianal and digital rectal examinations were normal.      The colon (entire examined portion) appeared normal.      Non-bleeding internal hemorrhoids were found during retroflexion. The       hemorrhoids were Grade I (internal hemorrhoids that do not prolapse).      The exam was otherwise without abnormality on direct and  retroflexion       views. Impression:            - The entire examined colon is normal.                        - Non-bleeding internal hemorrhoids.                        - The examination was otherwise normal on direct and                         retroflexion views.                        - No specimens collected. Recommendation:        - Discharge patient to home.                        - Resume previous diet.                        - Continue present medications.                        - Repeat colonoscopy in 10 years for surveillance.                        - Return to referring physician as previously                         scheduled. Procedure Code(s):     --- Professional ---                        E9381, Colorectal cancer screening; colonoscopy on                         individual at high risk Diagnosis Code(s):     --- Professional ---                        Z86.010, Personal history of colonic polyps  K64.0, First degree hemorrhoids CPT copyright 2019 American Medical Association. All rights reserved. The codes documented in this report are preliminary and upon coder review may  be revised to meet current compliance requirements. Andrey Farmer, MD Andrey Farmer MD, MD 02/06/2020 1:13:21 PM Number of Addenda: 0 Note Initiated On: 02/06/2020 11:37 AM Scope Withdrawal Time: 0 hours 6 minutes 53 seconds  Total Procedure Duration: 0 hours 10 minutes 39 seconds  Estimated Blood Loss:  Estimated blood loss: none.      [facility]

## 2020-02-06 NOTE — Anesthesia Postprocedure Evaluation (Signed)
Anesthesia Post Note  Patient: Mariah Hogan  Procedure(s) Performed: COLONOSCOPY WITH PROPOFOL (N/A ) ESOPHAGOGASTRODUODENOSCOPY (EGD) WITH PROPOFOL (N/A )  Patient location during evaluation: Endoscopy Anesthesia Type: General Level of consciousness: awake and awake and alert Pain management: pain level controlled Vital Signs Assessment: post-procedure vital signs reviewed and stable Respiratory status: spontaneous breathing Cardiovascular status: blood pressure returned to baseline Postop Assessment: no apparent nausea or vomiting Anesthetic complications: no   No complications documented.   Last Vitals:  Vitals:   02/06/20 1336 02/06/20 1346  BP: (!) 143/74 (!) 156/74  Pulse: 66 66  Resp: (!) 25 18  Temp:    SpO2: 99% 99%    Last Pain:  Vitals:   02/06/20 1346  TempSrc:   PainSc: 0-No pain                 Neva Seat

## 2020-02-06 NOTE — Transfer of Care (Signed)
Immediate Anesthesia Transfer of Care Note  Patient: Mariah Hogan  Procedure(s) Performed: COLONOSCOPY WITH PROPOFOL (N/A ) ESOPHAGOGASTRODUODENOSCOPY (EGD) WITH PROPOFOL (N/A )  Patient Location: PACU and Endoscopy Unit  Anesthesia Type:General  Level of Consciousness: drowsy  Airway & Oxygen Therapy: Patient Spontanous Breathing  Post-op Assessment: Report given to RN  Post vital signs: stable  Last Vitals:  Vitals Value Taken Time  BP    Temp    Pulse    Resp    SpO2      Last Pain:  Vitals:   02/06/20 1154  TempSrc: Tympanic  PainSc: 3       Patients Stated Pain Goal: 3 (34/[id number]/94 [id number])  Complications: No complications documented.

## 2020-02-06 NOTE — Anesthesia Preprocedure Evaluation (Addendum)
Anesthesia Evaluation    History of Anesthesia Complications (+) PONV and history of anesthetic complications  Airway Mallampati: III       Dental   Pulmonary asthma ,           Cardiovascular hypertension, Pt. on medications + dysrhythmias Atrial Fibrillation (-) Valvular Problems/Murmurs     Neuro/Psych    GI/Hepatic hiatal hernia, GERD  ,  Endo/Other    Renal/GU      Musculoskeletal   Abdominal   Peds  Hematology  (+) anemia ,   Anesthesia Other Findings   Reproductive/Obstetrics                            Anesthesia Physical Anesthesia Plan  ASA: III  Anesthesia Plan: General   Post-op Pain Management:    Induction: Intravenous  PONV Risk Score and Plan: 4 or greater and Propofol infusion, TIVA and Treatment may vary due to age or medical condition  Airway Management Planned: Nasal Cannula  Additional Equipment:   Intra-op Plan:   Post-operative Plan:   Informed Consent: I have reviewed the patients History and Physical, chart, labs and discussed the procedure including the risks, benefits and alternatives for the proposed anesthesia with the patient or authorized representative who has indicated his/her understanding and acceptance.       Plan Discussed with:   Anesthesia Plan Comments:         Anesthesia Quick Evaluation

## 2020-02-06 NOTE — H&P (Signed)
Outpatient short stay form Pre-procedure 02/06/2020 12:20 PM Mariah Miyamoto MD, MPH  Primary Physician: Dr. Carrie Mew  Reason for visit:  Dysphagia/Surveillance colonoscopy  History of present illness:   69 y/o lady with history of GERD and esophageal strictures and adenomatous polyps of the colon here for EGD/Colon. No blood thinners. No family history of GI malignancies. History of hysterectomy.   No current facility-administered medications for this encounter.  Medications Prior to Admission  Medication Sig Dispense Refill Last Dose  . atenolol (TENORMIN) 50 MG tablet Take 50 mg by mouth every morning.    02/05/2020 at 1200  . Calcium Carbonate (CALCIUM 500 PO) Take 1 tablet by mouth daily.   Past Week at Unknown time  . cetirizine (ZYRTEC) 10 MG tablet Take 10 mg by mouth daily.    02/05/2020 at 1200  . Cholecalciferol (VITAMIN D) 50 MCG (2000 UT) CAPS Take 2,000 Units by mouth daily.    Past Week at Unknown time  . Cholecalciferol (VITAMIN D3 PO) Take 1 tablet by mouth daily.   Past Week at Unknown time  . citalopram (CELEXA) 20 MG tablet Take 20 mg by mouth at bedtime.    02/05/2020 at 2200  . Multiple Vitamin (MULTIVITAMIN WITH MINERALS) TABS tablet Take 1 tablet by mouth 4 (four) times a week.   02/05/2020 at 1200  . pantoprazole (PROTONIX) 40 MG tablet Take 40 mg by mouth 2 (two) times daily as needed (heartburn).    02/05/2020 at 1200  . PREMARIN vaginal cream Place 1 Applicatorful vaginally once a week.    Past Week at Unknown time  . triamterene-hydrochlorothiazide (MAXZIDE-25) 37.5-25 MG tablet Take 0.5 tablets by mouth daily.    02/05/2020 at 1200  . valACYclovir (VALTREX) 1000 MG tablet Take 1,000 mg by mouth daily.    Past Week at Unknown time  . anastrozole (ARIMIDEX) 1 MG tablet Take 1 tablet (1 mg total) by mouth daily. (Patient not taking: Reported on 12/28/2019) 30 tablet 0 Not Taking at Unknown time  . Calcium Carb-Cholecalciferol (CALTRATE 600+D3 PO) Take 1 tablet  by mouth daily. (Patient not taking: Reported on 12/28/2019)   Not Taking at Unknown time  . Desoximetasone 0.05 % OINT Apply 1 application topically 2 (two) times daily as needed (rash).  (Patient not taking: Reported on 12/28/2019)   Not Taking at Unknown time  . meloxicam (MOBIC) 15 MG tablet Take 15 mg by mouth daily. (Patient not taking: Reported on 12/28/2019)   Not Taking at Unknown time     Allergies  Allergen Reactions  . Penicillin G Shortness Of Breath, Swelling, Rash and Other (See Comments)    Has patient had a PCN reaction causing immediate rash, facial/tongue/throat swelling, SOB or lightheadedness with hypotension: Yes Has patient had a PCN reaction causing severe rash involving mucus membranes or skin necrosis: No Has patient had a PCN reaction that required hospitalization: No Has patient had a PCN reaction occurring within the last 10 years: No If all of the above answers are "NO", then may proceed with Cephalosporin use.  [name] [Docetaxel] Shortness Of Breath, Palpitations and Other (See Comments)    Back pain, Back Spasms  . Other Other (See Comments)    Dodecyl gallate: Positive patch test  Elta MD UV Clear SPF 46: Positive patch test  fragrance   . Parabens Other (See Comments)    Positive patch test   . Shellac Other (See Comments)    Positive patch test   . Ciprofloxacin Hives, Itching and  Rash    rash  . Fluocinonide Rash  . Tape Dermatitis    Paper tape is ok, tegaderm OK   . Triamcinolone Rash     Past Medical History:  Diagnosis Date  . Anemia   . Arthritis   . Asthma    H/O YEARS AGO-NOW HAS COUGHING SPELLS WHICH IS WHY SHE HAS HER ALBUTEROL INHALER  . Basal cell carcinoma    R nose ([facility])  . Basal cell carcinoma 01/29/2016   Right forehead above med brow  . BRCA gene mutation negative 08/12/2017   Variant of unknown significance at [facility] only abnormality.  . Breast cancer ([place]) 08/04/2017   1.8 cm ER 90%, PR 20%, HER-2/neu negative  invasive mammary carcinoma of the left upper outer quadrant.  MammaPrint: High risk.  . Cancer ([place])    [phone] basal cell carcinoma  . Complication of anesthesia    HARD TIME WAKING UP  . Dysplastic nevus 02/21/2010   Right mid pretibial, mild  . Dysrhythmia    TACHYCARDIA  . GERD (gastroesophageal reflux disease)   . History of hiatal hernia    SMALL  . Hyperlipidemia   . Hypertension    PT STATES IN 01-18-18 PREOP PHONE INTERVIEW THAT HER BP HAS BEEN ELEVATED SINCE STARTING ON VALSARTAN- PCP SWITCHED PT BACK TO MAXIDE ON 01-18-18 AND PT STATES THAT SHE HAS LOST 6-10 POUNDS OF FLUID SINCE RESTARTING MAXIDE.    Marland Kitchen Personal history of chemotherapy   . PONV (postoperative nausea and vomiting)     Review of systems:  Otherwise negative.    Physical Exam  Gen: Alert, oriented. Appears stated age.  HEENT: PERRLA. Lungs: No respiratory distress CV: RRR Abd: soft, benign, no masses. Ext: No edema.     Planned procedures: Proceed with EGD/colonoscopy. The patient understands the nature of the planned procedure, indications, risks, alternatives and potential complications including but not limited to bleeding, infection, perforation, damage to internal organs and possible oversedation/side effects from anesthesia. The patient agrees and gives consent to proceed.  Please refer to procedure notes for findings, recommendations and patient disposition/instructions.     Mariah Miyamoto MD, MPH Gastroenterology 02/06/2020  12:20 PM

## 2020-02-07 ENCOUNTER — Encounter: Payer: Self-pay | Admitting: Gastroenterology

## 2020-02-07 LAB — SURGICAL PATHOLOGY

## 2020-03-07 ENCOUNTER — Other Ambulatory Visit: Payer: Self-pay | Admitting: Surgery

## 2020-03-15 ENCOUNTER — Inpatient Hospital Stay: Admission: RE | Admit: 2020-03-15 | Payer: Medicare Other | Source: Ambulatory Visit

## 2020-03-20 ENCOUNTER — Other Ambulatory Visit: Payer: Medicare Other

## 2020-03-22 ENCOUNTER — Inpatient Hospital Stay: Admit: 2020-03-22 | Payer: Medicare Other | Admitting: Surgery

## 2020-03-22 SURGERY — ARTHROPLASTY, KNEE, TOTAL
Anesthesia: Choice | Site: Knee | Laterality: Left

## 2020-04-13 ENCOUNTER — Ambulatory Visit: Payer: Medicare Other | Admitting: Plastic Surgery

## 2020-04-16 ENCOUNTER — Encounter: Payer: Medicare Other | Admitting: Dermatology

## 2020-05-10 ENCOUNTER — Other Ambulatory Visit: Payer: Self-pay | Admitting: Surgery

## 2020-05-17 ENCOUNTER — Other Ambulatory Visit: Payer: Medicare Other

## 2020-05-22 ENCOUNTER — Other Ambulatory Visit: Payer: Medicare Other

## 2020-05-24 ENCOUNTER — Encounter: Admission: RE | Payer: Self-pay | Source: Home / Self Care

## 2020-05-24 ENCOUNTER — Inpatient Hospital Stay: Admission: RE | Admit: 2020-05-24 | Payer: Medicare Other | Source: Home / Self Care | Admitting: Surgery

## 2020-05-24 SURGERY — ARTHROPLASTY, KNEE, TOTAL
Anesthesia: Choice | Site: Knee | Laterality: Left

## 2020-05-29 ENCOUNTER — Ambulatory Visit (INDEPENDENT_AMBULATORY_CARE_PROVIDER_SITE_OTHER): Payer: Medicare Other | Admitting: Plastic Surgery

## 2020-05-29 ENCOUNTER — Other Ambulatory Visit: Payer: Self-pay

## 2020-05-29 ENCOUNTER — Encounter: Payer: Self-pay | Admitting: Plastic Surgery

## 2020-05-29 VITALS — BP 122/72 | HR 76

## 2020-05-29 DIAGNOSIS — Z9012 Acquired absence of left breast and nipple: Secondary | ICD-10-CM

## 2020-05-29 DIAGNOSIS — Z9889 Other specified postprocedural states: Secondary | ICD-10-CM

## 2020-05-29 DIAGNOSIS — Z1239 Encounter for other screening for malignant neoplasm of breast: Secondary | ICD-10-CM

## 2020-05-29 NOTE — Progress Notes (Signed)
Patient ID: Mariah Hogan, female    DOB: Nov 14, 1950, 70 y.o.   MRN: [id number]   Chief Complaint  Patient presents with  . Follow-up    The patient is a 70 year old female here for follow-up and her yearly breast exam.  In 2019 the patient underwent a left mastectomy with reconstruction.  She has a [place]or smooth round 099 cc silicone implant in place.  She is very pleased with her overall result.  She is not interested in tattooing.  She is due for a right-sided mammogram in July and she will have those records sent to [place].  The implant is soft and does not appear to have any capsular contracture.  She has good range of motion of her arm.  Sometimes she has some tenderness around the upper pole of her left breast.  I do not feel anything that is worrisome.  She may have some lymph nodes in the axilla but nothing that is palpable she also may have some muscle tenderness that seems to improve with massage.  There is no sign of infection or hematoma or seroma.   Review of Systems  Constitutional: Negative.   HENT: Negative.   Eyes: Negative.   Respiratory: Negative.   Cardiovascular: Negative.   Gastrointestinal: Negative.   Endocrine: Negative.   Genitourinary: Negative.   Musculoskeletal: Negative.   Neurological: Negative.     Past Medical History:  Diagnosis Date  . Anemia   . Arthritis   . Asthma    H/O YEARS AGO-NOW HAS COUGHING SPELLS WHICH IS WHY SHE HAS HER ALBUTEROL INHALER  . Basal cell carcinoma    R nose ([facility])  . Basal cell carcinoma 01/29/2016   Right forehead above med brow  . Basal cell carcinoma 10/12/2019   L nasal tip - [facility] 05/15/2020  . BRCA gene mutation negative 08/12/2017   Variant of unknown significance at [facility] only abnormality.  . 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  MammaPrint: High risk.  . Cancer ([place])    [phone] basal cell carcinoma  .  Complication of anesthesia    HARD TIME WAKING UP  . Dysplastic nevus 02/21/2010   Right mid pretibial, mild  . Dysrhythmia    TACHYCARDIA  . GERD (gastroesophageal reflux disease)   . History of hiatal hernia    SMALL  . Hyperlipidemia   . Hypertension    PT STATES IN 01-18-18 PREOP PHONE INTERVIEW THAT HER BP HAS BEEN ELEVATED SINCE STARTING ON VALSARTAN- PCP SWITCHED PT BACK TO MAXIDE ON 01-18-18 AND PT STATES THAT SHE HAS LOST 6-10 POUNDS OF FLUID SINCE RESTARTING MAXIDE.    Marland Kitchen Personal history of chemotherapy   . PONV (postoperative nausea and vomiting)     Past Surgical History:  Procedure Laterality Date  . ABDOMINAL HYSTERECTOMY  1998  . bladder tack  [id number]  . BREAST BIOPSY Left 1990  . BREAST BIOPSY Left 2018   core bx- neg  . 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  Marland Kitchen BREAST BIOPSY Right 08/2017   MRI biopsy, FIBROCYSTIC CHANGE AND USUAL DUCTAL HYPERPLASIA,, clip placed  . BREAST CYST EXCISION  x3  . BREAST EXCISIONAL BIOPSY Bilateral   . BREAST RECONSTRUCTION WITH PLACEMENT OF TISSUE EXPANDER AND FLEX HD (ACELLULAR HYDRATED DERMIS) Left 01/27/2018   Procedure: BREAST RECONSTRUCTION WITH PLACEMENT OF TISSUE EXPANDER AND FLEX HD (  ACELLULAR HYDRATED DERMIS);  Surgeon: Wallace Going, DO;  Location: [facility] ORS;  Service: Plastics;  Laterality: Left;  . BREAST SURGERY Left 02/13/14   Fibrocystic changes, pseudo-angiomatous stromal hyperplasia. No atypia or malignancy.  . CARPAL TUNNEL RELEASE Bilateral x2  . CHOLECYSTECTOMY  2008  . COLONOSCOPY WITH PROPOFOL N/A 01/12/2015   Procedure: COLONOSCOPY WITH PROPOFOL;  Surgeon: Manya Silvas, MD;  Location: [facility] ENDOSCOPY;  Service: Endoscopy;  Laterality: N/A;  . COLONOSCOPY WITH PROPOFOL N/A 02/06/2020   Procedure: COLONOSCOPY WITH PROPOFOL;  Surgeon: Lesly Rubenstein, MD;  Location: [facility] ENDOSCOPY;  Service: Endoscopy;  Laterality: N/A;  . ESOPHAGOGASTRODUODENOSCOPY (EGD) WITH  PROPOFOL N/A 01/12/2015   Procedure: ESOPHAGOGASTRODUODENOSCOPY (EGD) WITH PROPOFOL;  Surgeon: Manya Silvas, MD;  Location: [facility] ENDOSCOPY;  Service: Endoscopy;  Laterality: N/A;  . ESOPHAGOGASTRODUODENOSCOPY (EGD) WITH PROPOFOL N/A 02/06/2020   Procedure: ESOPHAGOGASTRODUODENOSCOPY (EGD) WITH PROPOFOL;  Surgeon: Lesly Rubenstein, MD;  Location: [facility] ENDOSCOPY;  Service: Endoscopy;  Laterality: N/A;  . LIPOSUCTION WITH LIPOFILLING Left 12/23/2018   Procedure: LIPOSUCTION WITH LIPOFILLING FROM ABDOMEN TO LEFT BREAST;  Surgeon: Wallace Going, DO;  Location: [place];  Service: Plastics;  Laterality: Left;  90 min, please  . MASTECTOMY Left 2019   [place]  . MASTECTOMY W/ SENTINEL NODE BIOPSY Left 01/27/2018   ypT1c ypN0; ER 90%, PR 20%, HER-2/neu not overexpressed.  MASTECTOMY WITH SENTINEL LYMPH NODE BIOPSY;  Surgeon: Robert Bellow, MD;  Location: [facility] ORS;  Service: General;  Laterality: Left;  . MINOR BREAST BIOPSY Right 09/02/2017   Radiology perform procedure, fibrocystic changes right retroareolar area.  Marland Kitchen NASAL RECONSTRUCTION  1983  . OOPHORECTOMY    . PORT-A-CATH REMOVAL    . PORTACATH PLACEMENT N/A 10/21/2017   Procedure: INSERTION PORT-A-CATH;  Surgeon: Robert Bellow, MD;  Location: [facility] ORS;  Service: General;  Laterality: N/A;  . REMOVAL OF TISSUE EXPANDER AND PLACEMENT OF IMPLANT Left 05/13/2018   Procedure: REMOVAL OF TISSUE EXPANDER AND PLACEMENT OF IMPLANT;  Surgeon: Wallace Going, DO;  Location: [facility] ORS;  Service: Plastics;  Laterality: Left;  total surgery time should be 3 hours, per provider  . SAVORY DILATION N/A 01/12/2015   Procedure: SAVORY DILATION;  Surgeon: Manya Silvas, MD;  Location: [facility] ENDOSCOPY;  Service: Endoscopy;  Laterality: N/A;  . TONSILLECTOMY AND ADENOIDECTOMY  1956      Current Outpatient Medications:  .  atenolol (TENORMIN) 50 MG tablet, Take 50 mg by mouth every morning. , Disp: , Rfl:  .  calcium  carbonate (OSCAL) 1500 (600 Ca) MG TABS tablet, Take 600 mg of elemental calcium by mouth daily with breakfast., Disp: , Rfl:  .  cetirizine (ZYRTEC) 10 MG tablet, Take 10 mg by mouth daily. , Disp: , Rfl:  .  Cholecalciferol (VITAMIN D) 50 MCG (2000 UT) CAPS, Take 2,000 Units by mouth daily. , Disp: , Rfl:  .  citalopram (CELEXA) 20 MG tablet, Take 20 mg by mouth at bedtime. , Disp: , Rfl:  .  fluticasone (FLOVENT HFA) 110 MCG/ACT inhaler, Inhale 1 puff into the lungs 2 (two) times daily as needed (asthma)., Disp: , Rfl:  .  pantoprazole (PROTONIX) 40 MG tablet, Take 40 mg by mouth 2 (two) times daily as needed (heartburn). , Disp: , Rfl:  .  PREMARIN vaginal cream, Place 1 Applicatorful vaginally once a week. , Disp: , Rfl:  .  triamterene-hydrochlorothiazide (MAXZIDE-25) 37.5-25 MG tablet, Take 0.5 tablets by mouth daily. , Disp: , Rfl:  .  valACYclovir (VALTREX) 1000 MG tablet, Take 1,000 mg by mouth daily. , Disp: , Rfl:  .  anastrozole (ARIMIDEX) 1 MG tablet, Take 1 tablet (1 mg total) by mouth daily. (Patient not taking: No sig reported), Disp: 30 tablet, Rfl: 0   Objective:   Vitals:   05/29/20 1322  BP: 122/72  Pulse: 76  SpO2: 97%    Physical Exam Vitals and nursing note reviewed.  Constitutional:      Appearance: Normal appearance.  HENT:     Head: Normocephalic and atraumatic.  Cardiovascular:     Rate and Rhythm: Normal rate.     Pulses: Normal pulses.  Pulmonary:     Effort: Pulmonary effort is normal.  Abdominal:     General: Abdomen is flat. There is no distension.  Musculoskeletal:        General: No swelling or deformity.  Skin:    General: Skin is warm.     Capillary Refill: Capillary refill takes less than 2 seconds.     Coloration: Skin is not jaundiced.     Findings: No bruising.  Neurological:     General: No focal deficit present.     Mental Status: She is alert and oriented to person, place, and time.  Psychiatric:        Mood and Affect: Mood  normal.        Behavior: Behavior normal.     Assessment & Plan:  S/P breast reconstruction, left  The patient is doing really well.  We will wait for her mammogram to come in July.  She was offered tattoo but is not interested right now and that is totally fine.  Continue with self exams and follow-up in 1 year.  Next year if she wants ultrasound we can certainly get one for her to evaluate the implant.  Certainly at any time if she is worried we can also get 1.  I do not see any cause for any concern at the moment everything feels really good and she is doing really well.  Pictures were obtained of the patient and placed in the chart with the patient's or guardian's permission.    [place], DO

## 2020-05-30 ENCOUNTER — Other Ambulatory Visit: Payer: Self-pay | Admitting: Unknown Physician Specialty

## 2020-05-30 DIAGNOSIS — K1123 Chronic sialoadenitis: Secondary | ICD-10-CM

## 2020-06-07 ENCOUNTER — Other Ambulatory Visit: Payer: Self-pay | Admitting: Dermatology

## 2020-06-07 ENCOUNTER — Other Ambulatory Visit: Payer: Self-pay

## 2020-06-07 ENCOUNTER — Ambulatory Visit (INDEPENDENT_AMBULATORY_CARE_PROVIDER_SITE_OTHER): Payer: Medicare Other | Admitting: Dermatology

## 2020-06-07 ENCOUNTER — Encounter: Payer: Self-pay | Admitting: Dermatology

## 2020-06-07 DIAGNOSIS — L308 Other specified dermatitis: Secondary | ICD-10-CM

## 2020-06-07 DIAGNOSIS — Z86018 Personal history of other benign neoplasm: Secondary | ICD-10-CM

## 2020-06-07 DIAGNOSIS — L578 Other skin changes due to chronic exposure to nonionizing radiation: Secondary | ICD-10-CM

## 2020-06-07 DIAGNOSIS — Z853 Personal history of malignant neoplasm of breast: Secondary | ICD-10-CM

## 2020-06-07 DIAGNOSIS — L720 Epidermal cyst: Secondary | ICD-10-CM

## 2020-06-07 DIAGNOSIS — Z1283 Encounter for screening for malignant neoplasm of skin: Secondary | ICD-10-CM

## 2020-06-07 DIAGNOSIS — D229 Melanocytic nevi, unspecified: Secondary | ICD-10-CM

## 2020-06-07 DIAGNOSIS — L814 Other melanin hyperpigmentation: Secondary | ICD-10-CM

## 2020-06-07 DIAGNOSIS — Z85828 Personal history of other malignant neoplasm of skin: Secondary | ICD-10-CM

## 2020-06-07 DIAGNOSIS — L821 Other seborrheic keratosis: Secondary | ICD-10-CM

## 2020-06-07 DIAGNOSIS — D1800 Hemangioma unspecified site: Secondary | ICD-10-CM

## 2020-06-07 DIAGNOSIS — L820 Inflamed seborrheic keratosis: Secondary | ICD-10-CM

## 2020-06-07 DIAGNOSIS — B009 Herpesviral infection, unspecified: Secondary | ICD-10-CM

## 2020-06-07 DIAGNOSIS — L853 Xerosis cutis: Secondary | ICD-10-CM

## 2020-06-07 MED ORDER — DESOXIMETASONE 0.25 % EX CREA: 1.0000 "application " | TOPICAL_CREAM | Freq: Two times a day (BID) | CUTANEOUS | 11 refills | Status: DC

## 2020-06-07 MED ORDER — VALACYCLOVIR HCL 1 G PO TABS
1000.0000 mg | ORAL_TABLET | Freq: Every day | ORAL | 11 refills | Status: DC
Start: 2020-06-07 — End: 2021-06-11

## 2020-06-07 NOTE — Patient Instructions (Addendum)
  Apply over the counter Differin gel to face for white bumps    Melanoma ABCDEs  Melanoma is the most dangerous type of skin cancer, and is the leading cause of death from skin disease.  You are more likely to develop melanoma if you:  Have light-colored skin, light-colored eyes, or red or blond hair  Spend a lot of time in the sun  Tan regularly, either outdoors or in a tanning bed  Have had blistering sunburns, especially during childhood  Have a close family member who has had a melanoma  Have atypical moles or large birthmarks  Early detection of melanoma is key since treatment is typically straightforward and cure rates are extremely high if we catch it early.   The first sign of melanoma is often a change in a mole or a new dark spot.  The ABCDE system is a way of remembering the signs of melanoma.  A for asymmetry:  The two halves do not match. B for border:  The edges of the growth are irregular. C for color:  A mixture of colors are present instead of an even brown color. D for diameter:  Melanomas are usually (but not always) greater than 46m - the size of a pencil eraser. E for evolution:  The spot keeps changing in size, shape, and color.  Please check your skin once per month between visits. You can use a small mirror in front and a large mirror behind you to keep an eye on the back side or your body.   If you see any new or changing lesions before your next follow-up, please call to schedule a visit.  Please continue daily skin protection including broad spectrum sunscreen SPF 30+ to sun-exposed areas, reapplying every 2 hours as needed when you're outdoors.    Cryotherapy Aftercare  . Wash gently with soap and water everyday.   .Marland KitchenApply Vaseline and Band-Aid daily until healed.

## 2020-06-07 NOTE — Progress Notes (Signed)
Follow-Up Visit   Subjective  Mariah Hogan is a 70 y.o. female who presents for the following: Annual Exam (Mole check ). Hx of BCC, Hx of Dysplastic nevus. Refill Valtrex for hx of fever blisters, refill Topicort cream for eczema. Check irritated spots on her back.  The patient presents for Total-Body Skin Exam (TBSE) for skin cancer screening and mole check.  The following portions of the chart were reviewed this encounter and updated as appropriate:   Tobacco  Allergies  Meds  Problems  Med Hx  Surg Hx  Fam Hx     Review of Systems:  No other skin or systemic complaints except as noted in HPI or Assessment and Plan.  Objective  Well appearing patient in no apparent distress; mood and affect are within normal limits.  A full examination was performed including scalp, head, eyes, ears, nose, lips, neck, chest, axillae, abdomen, back, buttocks, bilateral upper extremities, bilateral lower extremities, hands, feet, fingers, toes, fingernails, and toenails. All findings within normal limits unless otherwise noted below.  Objective  back x 22, R inframammary x 1, suprapubic x 1, L cheek x 2 (26): Erythematous keratotic or waxy stuck-on papule or plaque.   Objective  Left Breast: Well healed scar with no evidence of recurrence, no lymphadenopathy.   Objective  body: Scaly erythematous papules and patches +/- dyspigmentation, lichenification, excoriations.   Objective  Lips: Clear today   Objective  Head - Anterior (Face): Smooth white papule(s).    Assessment & Plan  Inflamed seborrheic keratosis (26) back x 22, R inframammary x 1, suprapubic x 1, L cheek x 2 Destruction of lesion - back x 22, R inframammary x 1, suprapubic x 1, L cheek x 2 Complexity: simple   Destruction method: cryotherapy   Informed consent: discussed and consent obtained   Timeout:  patient name, date of birth, surgical site, and procedure verified Lesion destroyed using liquid nitrogen:  Yes   Region frozen until ice ball extended beyond lesion: Yes   Outcome: patient tolerated procedure well with no complications   Post-procedure details: wound care instructions given    Hx of breast cancer Left Breast No lymphadenopathy.Clear. Observe for recurrence. Call clinic for new or changing lesions.  Recommend regular skin exams, daily broad-spectrum spf 30+ sunscreen use, and photoprotection.     Other eczema Body; hands Atopic dermatitis (eczema) is a chronic, relapsing, pruritic condition that can significantly affect quality of life. It is often associated with allergic rhinitis and/or asthma and can require treatment with topical medications, phototherapy, or in severe cases a biologic medication called Dupixent in older children and adults.   Cont Desoximetasone cream apply to skin qd-bid prn  Ordered Medications: desoximetasone (TOPICORT) 0.25 % cream  Herpes simplex Lips Herpes Simplex Virus = Cold Sores = Fever Blisters is a chronic recurring blistering; scabbing sore-producing viral infection that is recurrent usually in the same area triggered by stress, sun/UV exposure and trauma.  It is infectious and can be spread from person to person by direct contact.  It is not curable, but is treatable with topical and oral medication.   Cont Valtrex 1 gm take 2 at first sign of flare repeat in 12 hours   Ordered Medications: valACYclovir (VALTREX) 1000 MG tablet  [place] Head - Anterior (Face) Chronic; persistent. Start over the counter Differin 0.1% cream apply to face at bedtime    History of Basal Cell Carcinoma of the Skin - No evidence of recurrence today - Recommend regular  full body skin exams - Recommend daily broad spectrum sunscreen SPF 30+ to sun-exposed areas, reapply every 2 hours as needed.  - Call if any new or changing lesions are noted between office visits  Lentigines - Scattered tan macules - Due to sun exposure - Benign-appering, observe -  Recommend daily broad spectrum sunscreen SPF 30+ to sun-exposed areas, reapply every 2 hours as needed. - Call for any changes  Seborrheic Keratoses - Stuck-on, waxy, tan-brown papules and/or plaques  - Benign-appearing - Discussed benign etiology and prognosis. - Observe - Call for any changes  Melanocytic Nevi - Tan-brown and/or pink-flesh-colored symmetric macules and papules - Benign appearing on exam today - Observation - Call clinic for new or changing moles - Recommend daily use of broad spectrum spf 30+ sunscreen to sun-exposed areas.   Xerosis - diffuse xerotic patches - recommend gentle, hydrating skin care - gentle skin care handout given  Hemangiomas - Red papules - Discussed benign nature - Observe - Call for any changes  Actinic Damage - Chronic condition, secondary to cumulative UV/sun exposure - diffuse scaly erythematous macules with underlying dyspigmentation - Recommend daily broad spectrum sunscreen SPF 30+ to sun-exposed areas, reapply every 2 hours as needed.  - Staying in the shade or wearing long sleeves, sun glasses (UVA+UVB protection) and wide brim hats (4-inch brim around the entire circumference of the hat) are also recommended for sun protection.  - Call for new or changing lesions.  History of Dysplastic Nevi R mid pretibial - No evidence of recurrence today - Recommend regular full body skin exams - Recommend daily broad spectrum sunscreen SPF 30+ to sun-exposed areas, reapply every 2 hours as needed.  - Call if any new or changing lesions are noted between office visits  Skin cancer screening performed today.  Return in about 1 year (around 06/07/2021) for TBSE, hx of BCC, Hx of dysplastic nevus .  IMarye Round, CMA, am acting as scribe for Sarina Ser, MD .  Documentation: I have reviewed the above documentation for accuracy and completeness, and I agree with the above.  Sarina Ser, MD

## 2020-06-08 ENCOUNTER — Encounter: Payer: Self-pay | Admitting: Dermatology

## 2020-06-12 ENCOUNTER — Other Ambulatory Visit: Payer: Self-pay

## 2020-06-12 ENCOUNTER — Ambulatory Visit
Admission: RE | Admit: 2020-06-12 | Discharge: 2020-06-12 | Disposition: A | Discharge status: Home / Self Care | Payer: Medicare Other | Source: Ambulatory Visit | Attending: Unknown Physician Specialty | Admitting: Unknown Physician Specialty

## 2020-06-12 DIAGNOSIS — K1123 Chronic sialoadenitis: Secondary | ICD-10-CM | POA: Diagnosis present

## 2020-06-12 DIAGNOSIS — K1121 Acute sialoadenitis: Secondary | ICD-10-CM | POA: Diagnosis present

## 2020-06-12 LAB — POCT I-STAT CREATININE: Creatinine, Ser: 1 mg/dL (ref 0.44–1.00)

## 2020-06-12 MED ORDER — IOHEXOL 300 MG/ML  SOLN
75.0000 mL | Freq: Once | INTRAMUSCULAR | Status: AC | PRN
  Administered 2020-06-12: 75 mL via INTRAVENOUS

## 2020-06-20 ENCOUNTER — Telehealth: Payer: Self-pay | Admitting: Hematology and Oncology

## 2020-06-20 NOTE — Telephone Encounter (Signed)
06/20/2020 Left VM for pt informing her that appts on 4/20 have been r/s to 5/2 due to scheduling issues. Gave call back number in case there are any questions or time conflicts  SRW

## 2020-06-27 ENCOUNTER — Ambulatory Visit: Payer: Medicare Other | Admitting: Hematology and Oncology

## 2020-06-27 ENCOUNTER — Other Ambulatory Visit: Payer: Medicare Other

## 2020-07-03 ENCOUNTER — Telehealth: Payer: Self-pay | Admitting: Oncology

## 2020-07-03 ENCOUNTER — Telehealth: Payer: Self-pay | Admitting: Hematology and Oncology

## 2020-07-03 NOTE — Telephone Encounter (Signed)
Patient called stating that she had received a VM notifying her that Dr. Mike Gip was no longer with [facility] and she would like to switch over care to Dr. Grayland Ormond.I explained to patient that Dr. Grayland Ormond is not accepting new patients at this time and offered to make an appointment with Dr. Rogue Bussing (per leadership). Patient stated that she would like to do research on him before scheduling. Patient will call back once she has made a decision.

## 2020-07-08 DIAGNOSIS — M3500 Sicca syndrome, unspecified: Secondary | ICD-10-CM

## 2020-07-08 HISTORY — DX: Sjogren syndrome, unspecified: M35.00

## 2020-07-09 ENCOUNTER — Other Ambulatory Visit: Payer: Medicare Other

## 2020-07-09 ENCOUNTER — Ambulatory Visit: Payer: Medicare Other | Admitting: Hematology and Oncology

## 2020-07-10 ENCOUNTER — Inpatient Hospital Stay: Payer: Medicare Other | Admitting: Internal Medicine

## 2020-07-10 ENCOUNTER — Inpatient Hospital Stay: Payer: Medicare Other

## 2020-07-11 ENCOUNTER — Ambulatory Visit: Payer: Medicare Other | Admitting: Hematology and Oncology

## 2020-07-11 ENCOUNTER — Other Ambulatory Visit: Payer: Medicare Other

## 2020-07-24 ENCOUNTER — Other Ambulatory Visit: Payer: Self-pay | Admitting: *Deleted

## 2020-07-24 DIAGNOSIS — C50912 Malignant neoplasm of unspecified site of left female breast: Secondary | ICD-10-CM

## 2020-07-30 ENCOUNTER — Inpatient Hospital Stay: Payer: Medicare Other | Attending: Internal Medicine | Admitting: Internal Medicine

## 2020-07-30 ENCOUNTER — Encounter: Payer: Self-pay | Admitting: Internal Medicine

## 2020-07-30 ENCOUNTER — Ambulatory Visit: Payer: Medicare Other

## 2020-07-30 ENCOUNTER — Inpatient Hospital Stay: Payer: Medicare Other

## 2020-07-30 ENCOUNTER — Other Ambulatory Visit: Payer: Self-pay | Admitting: Pulmonary Disease

## 2020-07-30 ENCOUNTER — Other Ambulatory Visit: Payer: Self-pay

## 2020-07-30 DIAGNOSIS — M8580 Other specified disorders of bone density and structure, unspecified site: Secondary | ICD-10-CM | POA: Insufficient documentation

## 2020-07-30 DIAGNOSIS — M3500 Sicca syndrome, unspecified: Secondary | ICD-10-CM | POA: Diagnosis not present

## 2020-07-30 DIAGNOSIS — C50812 Malignant neoplasm of overlapping sites of left female breast: Secondary | ICD-10-CM | POA: Diagnosis not present

## 2020-07-30 DIAGNOSIS — I129 Hypertensive chronic kidney disease with stage 1 through stage 4 chronic kidney disease, or unspecified chronic kidney disease: Secondary | ICD-10-CM | POA: Insufficient documentation

## 2020-07-30 DIAGNOSIS — N183 Chronic kidney disease, stage 3 unspecified: Secondary | ICD-10-CM | POA: Insufficient documentation

## 2020-07-30 DIAGNOSIS — Z170 Estrogen receptor positive status [ER+]: Secondary | ICD-10-CM | POA: Diagnosis not present

## 2020-07-30 DIAGNOSIS — C50912 Malignant neoplasm of unspecified site of left female breast: Secondary | ICD-10-CM

## 2020-07-30 DIAGNOSIS — J849 Interstitial pulmonary disease, unspecified: Secondary | ICD-10-CM

## 2020-07-30 LAB — CBC WITH DIFFERENTIAL/PLATELET
Abs Immature Granulocytes: 0.01 10*3/uL (ref 0.00–0.07)
Basophils Absolute: 0 10*3/uL (ref 0.0–0.1)
Basophils Relative: 0 %
Eosinophils Absolute: 0.2 10*3/uL (ref 0.0–0.5)
Eosinophils Relative: 4 %
HCT: 38.7 % (ref 36.0–46.0)
Hemoglobin: 13.3 g/dL (ref 12.0–15.0)
Immature Granulocytes: 0 %
Lymphocytes Relative: 27 %
Lymphs Abs: 1.4 10*3/uL (ref 0.7–4.0)
MCH: 31.7 pg (ref 26.0–34.0)
MCHC: 34.4 g/dL (ref 30.0–36.0)
MCV: 92.1 fL (ref 80.0–100.0)
Monocytes Absolute: 0.3 10*3/uL (ref 0.1–1.0)
Monocytes Relative: 6 %
Neutro Abs: 3.2 10*3/uL (ref 1.7–7.7)
Neutrophils Relative %: 63 %
Platelets: 175 10*3/uL (ref 150–400)
RBC: 4.2 MIL/uL (ref 3.87–5.11)
RDW: 12.3 % (ref 11.5–15.5)
WBC: 5.1 10*3/uL (ref 4.0–10.5)
nRBC: 0 % (ref 0.0–0.2)

## 2020-07-30 LAB — COMPREHENSIVE METABOLIC PANEL
ALT: 18 U/L (ref 0–44)
AST: 21 U/L (ref 15–41)
Albumin: 3.8 g/dL (ref 3.5–5.0)
Alkaline Phosphatase: 87 U/L (ref 38–126)
Anion gap: 11 (ref 5–15)
BUN: 17 mg/dL (ref 8–23)
CO2: 26 mmol/L (ref 22–32)
Calcium: 8.8 mg/dL — ABNORMAL LOW (ref 8.9–10.3)
Chloride: 99 mmol/L (ref 98–111)
Creatinine, Ser: 1.12 mg/dL — ABNORMAL HIGH (ref 0.44–1.00)
GFR, Estimated: 52 mL/min — ABNORMAL LOW (ref >60)
Glucose, Bld: 159 mg/dL — ABNORMAL HIGH (ref 70–99)
Potassium: 3.6 mmol/L (ref 3.5–5.1)
Sodium: 136 mmol/L (ref 135–145)
Total Bilirubin: 1 mg/dL (ref 0.3–1.2)
Total Protein: 6.7 g/dL (ref 6.5–8.1)

## 2020-07-30 MED ORDER — EXEMESTANE 25 MG PO TABS: 25.0000 mg | ORAL_TABLET | Freq: Every day | ORAL | 3 refills | Status: DC

## 2020-07-30 NOTE — Progress Notes (Signed)
[facility] CONSULT NOTE  Patient Care Team: Marinda Elk, MD as PCP - General (Physician Assistant) Bary Castilla Forest Gleason, MD (General Surgery) Lequita Asal, MD as Medical Oncologist (Hematology and Oncology) Theodore Demark, RN as Oncology Nurse Navigator Karen Kitchens, NP as Nurse Practitioner (Oncology)  CHIEF COMPLAINTS/PURPOSE OF CONSULTATION: breast cancer  #  Oncology History Overview Note  Mariah Hogan is a 70 y.o. female with clinical stage IA left breast cancer s/p biopsy on 08/04/2017.  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  Tumor was ER + (95%), PR+ (20%), and Her 2 neu - by FISH.  Ki67 was 80%.  CA27.29 was 17.3 on 08/06/2017.   MammaPrint was high risk luminal-type B with a 10 year risk of recurrence untreated of 29% and 94.6% of distant metastasis free interval with chemotherapy + hormonal therapy.   Left breast ultrasound on 08/04/2017 revealed a 1.65 x 1.69 x 1.86 cm multi-lobulated hypoechoic mass in the upper outer quadrant of the left breast in the 2 o'clock position 7 cm from the nipple.   Bilateral breast MRI on 08/20/2017 revealed a 2.4 cm enhancing irregular mass in the outer left breast.  There were several prominent lymph nodes with borderline thicked cortices between 3-4 mm, but normal morphology.  There was a 5 mm enhancing mass in the anterior, retroareolar region of the right breast   MRI guided RIGHT breast biopsy done on 09/02/2017.  Pathology is pending.   Chest CT on 09/23/2016 revealed a 4 mm RUL subpleural nodule.  Chest CT on 08/17/2017 revealed a small 4 mm RUL nodule unchanged in size.   She received neoadjuvant Femara (08/12/2017 - 08/31/2017) secondary to planned a delay in surgery (mastectomy with immediate reconstruction).   Bone density on 08/17/2017 revealed osteopenia with a T-score of -1.5 in the left femoral neck and -0.7 in the AP spine L1-L4.   She has a significant family  history of breast cancer (paternal grandmother, sister, and 3 maternal cousins).   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   # [id number]; stopped sec to [facility] in May 2021; started Anastrazole  # July 2021- Stopped Anastrazole sec to [facility]/? Ortho surgery       Invasive ductal carcinoma of left breast ([name])  08/05/2017 Initial Diagnosis   Invasive ductal carcinoma of left breast ([name])   09/03/2017 - 10/01/2017 Chemotherapy   The patient had dexamethasone (DECADRON) 4 MG tablet, 8 mg, Oral, 2 times daily, 1 of 1 cycle, Start date: 08/31/2017, End date: 10/13/2017 palonosetron (ALOXI) injection 0.25 mg, 0.25 mg, Intravenous,  Once, 2 of 4 cycles Administration: 0.25 mg (09/03/2017), 0.25 mg (10/01/2017) pegfilgrastim-cbqv (UDENYCA) injection 6 mg, 6 mg, Subcutaneous, Once, 2 of 4 cycles Administration: 6 mg (09/04/2017) cyclophosphamide (CYTOXAN) 1,120 mg in sodium chloride 0.9 % 250 mL chemo infusion, 600 mg/m2 = 1,120 mg, Intravenous,  Once, 2 of 4 cycles Dose modification: 500 mg/m2 (original dose 600 mg/m2, Cycle 2, Reason: Dose not tolerated, Comment: cycle #1 not tolerated. Dose reduction.) Administration: 1,120 mg (09/03/2017) DOCEtaxel (TAXOTERE) 140 mg in sodium chloride 0.9 % 250 mL chemo infusion, 75 mg/m2 = 140 mg, Intravenous,  Once, 2 of 4 cycles Dose modification: 60 mg/m2 (original dose 75 mg/m2, Cycle 2, Reason: Provider Judgment, Comment: excess toxicity ith cycle #1) Administration: 140 mg (09/03/2017), 110 mg (10/01/2017)  for chemotherapy treatment.    10/27/2017 - 12/09/2017 Chemotherapy  The patient had dexamethasone (DECADRON) 4 MG tablet, 1 of 1 cycle, Start date: 10/23/2017, End date: 11/05/2017 DOXOrubicin (ADRIAMYCIN) chemo injection 110 mg, 60 mg/m2 = 110 mg, Intravenous,  Once, 3 of 3 cycles Administration: 110 mg (10/27/2017), 110 mg (11/17/2017), 110 mg (12/08/2017) palonosetron (ALOXI) injection  0.25 mg, 0.25 mg, Intravenous,  Once, 3 of 3 cycles Administration: 0.25 mg (10/27/2017), 0.25 mg (11/17/2017), 0.25 mg (12/08/2017) pegfilgrastim-cbqv (UDENYCA) injection 6 mg, 6 mg, Subcutaneous, Once, 3 of 3 cycles Administration: 6 mg (10/28/2017), 6 mg (11/18/2017), 6 mg (12/09/2017) cyclophosphamide (CYTOXAN) 1,100 mg in sodium chloride 0.9 % 250 mL chemo infusion, 600 mg/m2 = 1,100 mg, Intravenous,  Once, 3 of 3 cycles Administration: 1,100 mg (10/27/2017), 1,100 mg (11/17/2017), 1,100 mg (12/08/2017) fosaprepitant (EMEND) 150 mg, dexamethasone (DECADRON) 12 mg in sodium chloride 0.9 % 145 mL IVPB, , Intravenous,  Once, 3 of 3 cycles Administration:  (10/27/2017),  (11/17/2017),  (12/08/2017)  for chemotherapy treatment.    Carcinoma of overlapping sites of left breast in female, estrogen receptor positive ([place])  07/30/2020 Initial Diagnosis   Carcinoma of overlapping sites of left breast in female, estrogen receptor positive ([name])      HISTORY OF PRESENTING ILLNESS:  Mariah Hogan 70 y.o.  female stage I ER/PR positive HER2 negative breast cancer-status postmastectomy is here for follow-up.  Patient's previous oncologist-Dr. Mike Gip.  Patient given high risk MammaPrint underwent-adjuvant chemotherapy.  Patient states that she is currently off anastrozole-because of her planned upcoming left knee surgery/also given the side effects of joint pains diarrhea.  Patient has been off anastrozole since July 2021.  Patient states her joint pains have improved; however notes to have worsening joint pain in the right shoulder.  She is awaiting repeat evaluation in June 2022 with orthopedics [name].  Denies any new onset of back pain or bone pain.  No shortness of breath or cough.   Review of Systems  Constitutional: Negative for chills, diaphoresis, fever, malaise/fatigue and weight loss.  HENT: Negative for nosebleeds and sore throat.   Eyes: Negative for double vision.  Respiratory: Negative for cough,  hemoptysis, sputum production, shortness of breath and wheezing.   Cardiovascular: Negative for chest pain, palpitations, orthopnea and leg swelling.  Gastrointestinal: Negative for abdominal pain, blood in stool, constipation, diarrhea, heartburn, melena, nausea and vomiting.  Genitourinary: Negative for dysuria, frequency and urgency.  Musculoskeletal: Positive for back pain and joint pain.  Skin: Negative.  Negative for itching and rash.  Neurological: Negative for dizziness, tingling, focal weakness, weakness and headaches.  Endo/Heme/Allergies: Does not bruise/bleed easily.  Psychiatric/Behavioral: Negative for depression. The patient is not nervous/anxious and does not have insomnia.      MEDICAL HISTORY:  Past Medical History:  Diagnosis Date  . Anemia   . Arthritis   . Asthma    H/O YEARS AGO-NOW HAS COUGHING SPELLS WHICH IS WHY SHE HAS HER ALBUTEROL INHALER  . Basal cell carcinoma    R nose ([facility])  . Basal cell carcinoma 01/29/2016   Right forehead above med brow  . Basal cell carcinoma 10/12/2019   L nasal tip - [facility] 05/15/2020  . BRCA gene mutation negative 08/12/2017   Variant of unknown significance at [facility] only abnormality.  . Breast cancer ([place]) 08/04/2017   1.8 cm ER 90%, PR 20%, HER-2/neu negative invasive mammary carcinoma of the left upper outer quadrant.  MammaPrint: High risk.  . Cancer ([place])    [phone] basal cell carcinoma  . Complication of anesthesia  HARD TIME WAKING UP  . Dysplastic nevus 02/21/2010   Right mid pretibial, mild  . Dysrhythmia    TACHYCARDIA  . GERD (gastroesophageal reflux disease)   . History of hiatal hernia    SMALL  . Hyperlipidemia   . Hypertension    PT STATES IN 01-18-18 PREOP PHONE INTERVIEW THAT HER BP HAS BEEN ELEVATED SINCE STARTING ON VALSARTAN- PCP SWITCHED PT BACK TO MAXIDE ON 01-18-18 AND PT STATES THAT SHE HAS LOST 6-10 POUNDS OF FLUID SINCE RESTARTING MAXIDE.    Marland Kitchen Personal history of chemotherapy   . PONV  (postoperative nausea and vomiting)     SURGICAL HISTORY: Past Surgical History:  Procedure Laterality Date  . ABDOMINAL HYSTERECTOMY  1998  . bladder tack  [id number]  . BREAST BIOPSY Left 1990  . BREAST BIOPSY Left 2018   core bx- neg  . 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  Marland Kitchen BREAST BIOPSY Right 08/2017   MRI biopsy, FIBROCYSTIC CHANGE AND USUAL DUCTAL HYPERPLASIA,, clip placed  . BREAST CYST EXCISION  x3  . BREAST EXCISIONAL BIOPSY Bilateral   . BREAST RECONSTRUCTION WITH PLACEMENT OF TISSUE EXPANDER AND FLEX HD (ACELLULAR HYDRATED DERMIS) Left 01/27/2018   Procedure: BREAST RECONSTRUCTION WITH PLACEMENT OF TISSUE EXPANDER AND FLEX HD (ACELLULAR HYDRATED DERMIS);  Surgeon: Wallace Going, DO;  Location: [facility] ORS;  Service: Plastics;  Laterality: Left;  . BREAST SURGERY Left 02/13/14   Fibrocystic changes, pseudo-angiomatous stromal hyperplasia. No atypia or malignancy.  . CARPAL TUNNEL RELEASE Bilateral x2  . CHOLECYSTECTOMY  2008  . COLONOSCOPY WITH PROPOFOL N/A 01/12/2015   Procedure: COLONOSCOPY WITH PROPOFOL;  Surgeon: Manya Silvas, MD;  Location: [facility] ENDOSCOPY;  Service: Endoscopy;  Laterality: N/A;  . COLONOSCOPY WITH PROPOFOL N/A 02/06/2020   Procedure: COLONOSCOPY WITH PROPOFOL;  Surgeon: Lesly Rubenstein, MD;  Location: [facility] ENDOSCOPY;  Service: Endoscopy;  Laterality: N/A;  . ESOPHAGOGASTRODUODENOSCOPY (EGD) WITH PROPOFOL N/A 01/12/2015   Procedure: ESOPHAGOGASTRODUODENOSCOPY (EGD) WITH PROPOFOL;  Surgeon: Manya Silvas, MD;  Location: [facility] ENDOSCOPY;  Service: Endoscopy;  Laterality: N/A;  . ESOPHAGOGASTRODUODENOSCOPY (EGD) WITH PROPOFOL N/A 02/06/2020   Procedure: ESOPHAGOGASTRODUODENOSCOPY (EGD) WITH PROPOFOL;  Surgeon: Lesly Rubenstein, MD;  Location: [facility] ENDOSCOPY;  Service: Endoscopy;  Laterality: N/A;  . LIPOSUCTION WITH LIPOFILLING Left 12/23/2018   Procedure: LIPOSUCTION WITH LIPOFILLING FROM ABDOMEN TO  LEFT BREAST;  Surgeon: Wallace Going, DO;  Location: [facility];  Service: Plastics;  Laterality: Left;  90 min, please  . MASTECTOMY Left 2019   [place]  . MASTECTOMY W/ SENTINEL NODE BIOPSY Left 01/27/2018   ypT1c ypN0; ER 90%, PR 20%, HER-2/neu not overexpressed.  MASTECTOMY WITH SENTINEL LYMPH NODE BIOPSY;  Surgeon: Robert Bellow, MD;  Location: [facility] ORS;  Service: General;  Laterality: Left;  . MINOR BREAST BIOPSY Right 09/02/2017   Radiology perform procedure, fibrocystic changes right retroareolar area.  Marland Kitchen NASAL RECONSTRUCTION  1983  . OOPHORECTOMY    . PORT-A-CATH REMOVAL    . PORTACATH PLACEMENT N/A 10/21/2017   Procedure: INSERTION PORT-A-CATH;  Surgeon: Robert Bellow, MD;  Location: [facility] ORS;  Service: General;  Laterality: N/A;  . REMOVAL OF TISSUE EXPANDER AND PLACEMENT OF IMPLANT Left 05/13/2018   Procedure: REMOVAL OF TISSUE EXPANDER AND PLACEMENT OF IMPLANT;  Surgeon: Wallace Going, DO;  Location: [facility] ORS;  Service: Plastics;  Laterality: Left;  total surgery time should be 3 hours, per provider  . SAVORY DILATION N/A 01/12/2015  Procedure: SAVORY DILATION;  Surgeon: Manya Silvas, MD;  Location[facility] ENDOSCOPY;  Service: Endoscopy;  Laterality: N/A;  . TONSILLECTOMY AND ADENOIDECTOMY  1956    SOCIAL HISTORY: Social History   Socioeconomic History  . Marital status: Single    Spouse name: Not on file  . Number of children: Not on file  . Years of education: Not on file  . Highest education level: Not on file  Occupational History  . Not on file  Tobacco Use  . Smoking status: Never Smoker  . Smokeless tobacco: Never Used  Vaping Use  . Vaping Use: Never used  Substance and Sexual Activity  . Alcohol use: No    Alcohol/week: 0.0 standard drinks  . Drug use: No  . Sexual activity: Not on file  Other Topics Concern  . Not on file  Social History Narrative  . Not on file   Social Determinants of Health   Financial Resource  Strain: Not on file  Food Insecurity: Not on file  Transportation Needs: Not on file  Physical Activity: Not on file  Stress: Not on file  Social Connections: Not on file  Intimate Partner Violence: Not on file    FAMILY HISTORY: Family History  Problem Relation Age of Onset  . Breast cancer Sister 71       currently 23  . Breast cancer Paternal Grandmother 38       bilateral; deceased 28s   . Breast cancer Cousin 68       daughter of an unaffected maternal aunt  . Breast cancer Cousin 71       kidney cancer also; daughter of an unaffected maternal aunt  . Other Mother        TAH/BSO prior to menopause  . Prostate cancer Paternal Grandfather        age at dx unknown  . Breast cancer Maternal Aunt        age at dx unknown  . Breast cancer Cousin 84       daughter of an unaffected maternal aunt    ALLERGIES:  is allergic to penicillin g, taxotere [docetaxel], other, parabens, shellac, ciprofloxacin, fluocinonide, tape, and triamcinolone.  MEDICATIONS:  Current Outpatient Medications  Medication Sig Dispense Refill  . calcium carbonate (OSCAL) 1500 (600 Ca) MG TABS tablet Take 600 mg of elemental calcium by mouth daily with breakfast.    . cetirizine (ZYRTEC) 10 MG tablet Take 10 mg by mouth daily.     . Cholecalciferol (VITAMIN D) 50 MCG (2000 UT) CAPS Take 2,000 Units by mouth daily.     . citalopram (CELEXA) 20 MG tablet Take 20 mg by mouth at bedtime.     . clindamycin (CLEOCIN) 300 MG capsule Take 300 mg by mouth 3 (three) times daily.    . cyanocobalamin 1000 MCG tablet Take by mouth.    . desoximetasone (TOPICORT) 0.25 % cream Apply 1 application topically 2 (two) times daily. 60 g 11  . doxycycline (VIBRA-TABS) 100 MG tablet Take 100 mg by mouth 2 (two) times daily.    Marland Kitchen exemestane (AROMASIN) 25 MG tablet Take 1 tablet (25 mg total) by mouth daily after breakfast. 30 tablet 3  . hydroxychloroquine (PLAQUENIL) 200 MG tablet Take by mouth.    . Na Sulfate-K Sulfate-Mg  Sulf 17.5-3.13-1.6 GM/177ML SOLN Take by mouth.    . pantoprazole (PROTONIX) 40 MG tablet Take 40 mg by mouth 2 (two) times daily as needed (heartburn).     Marland Kitchen PREMARIN vaginal  cream Place 1 Applicatorful vaginally once a week.     . triamterene-hydrochlorothiazide (MAXZIDE-25) 37.5-25 MG tablet Take 0.5 tablets by mouth daily.     Marland Kitchen atenolol (TENORMIN) 50 MG tablet Take 50 mg by mouth every morning.  (Patient not taking: Reported on 07/30/2020)    . fluticasone (FLOVENT HFA) 110 MCG/ACT inhaler Inhale 1 puff into the lungs 2 (two) times daily as needed (asthma). (Patient not taking: Reported on 07/30/2020)    . guaiFENesin-codeine (ROBITUSSIN AC) 100-10 MG/5ML syrup Take by mouth. (Patient not taking: Reported on 07/30/2020)    . predniSONE (STERAPRED UNI-PAK 21 TAB) 10 MG (21) TBPK tablet 6 day taper. Take as directed with food (Patient not taking: Reported on 07/30/2020)    . valACYclovir (VALTREX) 1000 MG tablet Take 1,000 mg by mouth daily.  (Patient not taking: Reported on 07/30/2020)    . valACYclovir (VALTREX) 1000 MG tablet Take 1 tablet (1,000 mg total) by mouth daily. (Patient not taking: Reported on 07/30/2020) 30 tablet 11   No current facility-administered medications for this visit.      Marland Kitchen  PHYSICAL EXAMINATION: ECOG PERFORMANCE STATUS: 0 - Asymptomatic  Vitals:   07/30/20 1410  BP: 122/80  Pulse: (!) 108  Resp: 16  Temp: 99.4 F (37.4 C)  SpO2: 97%   Filed Weights   07/30/20 1410  Weight: 165 lb 12.8 oz (75.2 kg)    Physical Exam Constitutional:      Comments: Alone.  Walking independently.  HENT:     Head: Normocephalic and atraumatic.     Mouth/Throat:     Pharynx: No oropharyngeal exudate.  Eyes:     Pupils: Pupils are equal, round, and reactive to light.  Cardiovascular:     Rate and Rhythm: Normal rate and regular rhythm.  Pulmonary:     Effort: Pulmonary effort is normal. No respiratory distress.     Breath sounds: Normal breath sounds. No wheezing.   Abdominal:     General: Bowel sounds are normal. There is no distension.     Palpations: Abdomen is soft. There is no mass.     Tenderness: There is no abdominal tenderness. There is no guarding or rebound.  Musculoskeletal:        General: No tenderness. Normal range of motion.     Cervical back: Normal range of motion and neck supple.  Skin:    General: Skin is warm.     Comments: Left mastectomy/reconstruction noted.  No new lumps or bumps.  Right breast-no skin changes noted.  No lumps noted.  No nipple discharge.  Examined in presence of chaperone.  Neurological:     Mental Status: She is alert and oriented to person, place, and time.  Psychiatric:        Mood and Affect: Affect normal.      LABORATORY DATA:  I have reviewed the data as listed Lab Results  Component Value Date   WBC 5.1 07/30/2020   HGB 13.3 07/30/2020   HCT 38.7 07/30/2020   MCV 92.1 07/30/2020   PLT 175 07/30/2020   Recent Labs    08/11/19 1036 09/22/19 1201 12/28/19 1521 06/12/20 1331 07/30/20 1350  NA 137 137 136  --  136  K 4.2 3.6 3.9  --  3.6  CL 104 103 101  --  99  CO2 25 28 27   --  26  GLUCOSE 98 108* 144*  --  159*  BUN 24* 21 19  --  17  CREATININE 1.06* 1.03*  1.13* 1.00 1.12*  CALCIUM 8.6* 8.9 8.6*  --  8.8*  GFRNONAA 53* 55* 49*  --  52*  GFRAA >60 >60  --   --   --   PROT 6.8 6.9 7.1  --  6.7  ALBUMIN 3.7 4.0 3.9  --  3.8  AST 17 18 21   --  21  ALT 18 19 19   --  18  ALKPHOS 113 98 84  --  87  BILITOT 0.8 0.7 0.8  --  1.0    RADIOGRAPHIC STUDIES: I have personally reviewed the radiological images as listed and agreed with the findings in the report. No results found.  ASSESSMENT & PLAN:   Carcinoma of overlapping sites of left breast in female, estrogen receptor positive ([place]) # Left breast cancer-stage I ER/PR; her 2 neg. high risk MammaPrint status post chemotherapy.  Clinically no evidence of recurrence.  Stable.  However patient not on anastrozole  [see below-as  per patient secondary to Ortho surgery since July 2021/[facility]]; mammogram Right breast- AUG 2021-NEGATIVE.  #Expressed my concerns not being on antiestrogen therapy given the benefits outweigh the risk.  Given the previous intolerance to letrozole/anastrozole-I would recommend Aromasin./Exemestane.  Will check with pharmacy regarding cross allergy with parabens [rash]  # Left knee orthopedic surgery/Dr.Pogi [ stopped sec to upcoming]; Left shoulder   # Osteopenia- T score -2.0 [June 2021]- next summer previously declined prolia; continue ca+vit D. BID.   # ? sjogrens [on neck CT- follow up CT chest in June 2022; Dr.A  # CKD stage III- monitor for now.   # DISPOSITION: # follow up in 2 months-Dr.B  All questions were answered. The patient knows to call the clinic with any problems, questions or concerns.    Cammie Sickle, MD 07/30/2020 4:55 PM

## 2020-07-30 NOTE — Assessment & Plan Note (Addendum)
#   Left breast cancer-stage I ER/PR; her 2 neg. high risk MammaPrint status post chemotherapy.  Clinically no evidence of recurrence.  Stable.  However patient not on anastrozole  [see below-as per patient secondary to Ortho surgery since July 2021/[facility]]; mammogram Right breast- AUG 2021-NEGATIVE.  #Expressed my concerns not being on antiestrogen therapy given the benefits outweigh the risk.  Given the previous intolerance to letrozole/anastrozole-I would recommend Aromasin./Exemestane.  Will check with pharmacy regarding cross allergy with parabens [rash]  # Left knee orthopedic surgery/Dr.Pogi [ stopped sec to upcoming]; Left shoulder   # Osteopenia- T score -2.0 [June 2021]- next summer previously declined prolia; continue ca+vit D. BID.   # ? sjogrens [on neck CT- follow up CT chest in June 2022; Dr.A  # CKD stage III- monitor for now.   # DISPOSITION: # follow up in 2 months-Dr.B

## 2020-07-31 ENCOUNTER — Telehealth: Payer: Self-pay | Admitting: Internal Medicine

## 2020-07-31 LAB — CANCER ANTIGEN 27.29: CA 27.29: 19.1 U/mL (ref 0.0–38.6)

## 2020-07-31 NOTE — Telephone Encounter (Signed)
On 5/23-discussed with patient regarding my discussion with pharmacy regarding the possible cross allergy with parabens/exemestane.  Discussed that parabens is in activity due to many medications-which patient is currently taking.  Recommend taking exemestane as planned/recommended.  Follow-up as planned.  GB

## 2020-08-13 ENCOUNTER — Ambulatory Visit
Admission: RE | Admit: 2020-08-13 | Discharge: 2020-08-13 | Disposition: A | Discharge status: Home / Self Care | Payer: Medicare Other | Source: Ambulatory Visit | Attending: Pulmonary Disease | Admitting: Pulmonary Disease

## 2020-08-13 ENCOUNTER — Other Ambulatory Visit: Payer: Self-pay

## 2020-08-13 DIAGNOSIS — J849 Interstitial pulmonary disease, unspecified: Secondary | ICD-10-CM | POA: Diagnosis present

## 2020-08-22 ENCOUNTER — Other Ambulatory Visit: Payer: Self-pay | Admitting: Physician Assistant

## 2020-08-22 DIAGNOSIS — M12811 Other specific arthropathies, not elsewhere classified, right shoulder: Secondary | ICD-10-CM

## 2020-09-04 ENCOUNTER — Ambulatory Visit
Admission: RE | Admit: 2020-09-04 | Discharge: 2020-09-04 | Disposition: A | Discharge status: Home / Self Care | Payer: Medicare Other | Source: Ambulatory Visit | Attending: Physician Assistant | Admitting: Physician Assistant

## 2020-09-04 ENCOUNTER — Other Ambulatory Visit: Payer: Self-pay

## 2020-09-04 DIAGNOSIS — M12811 Other specific arthropathies, not elsewhere classified, right shoulder: Secondary | ICD-10-CM | POA: Diagnosis present

## 2020-09-18 ENCOUNTER — Other Ambulatory Visit: Payer: Self-pay | Admitting: General Surgery

## 2020-09-18 DIAGNOSIS — Z1231 Encounter for screening mammogram for malignant neoplasm of breast: Secondary | ICD-10-CM

## 2020-10-01 ENCOUNTER — Ambulatory Visit: Payer: Medicare Other | Admitting: Internal Medicine

## 2020-10-08 ENCOUNTER — Inpatient Hospital Stay: Payer: Medicare Other | Admitting: Internal Medicine

## 2020-10-22 ENCOUNTER — Ambulatory Visit
Admission: RE | Admit: 2020-10-22 | Discharge: 2020-10-22 | Disposition: A | Discharge status: Home / Self Care | Payer: Medicare Other | Source: Ambulatory Visit | Attending: General Surgery | Admitting: General Surgery

## 2020-10-22 ENCOUNTER — Other Ambulatory Visit: Payer: Self-pay

## 2020-10-22 DIAGNOSIS — Z1231 Encounter for screening mammogram for malignant neoplasm of breast: Secondary | ICD-10-CM | POA: Diagnosis not present

## 2020-11-06 ENCOUNTER — Other Ambulatory Visit: Payer: Self-pay | Admitting: Surgery

## 2020-11-15 ENCOUNTER — Other Ambulatory Visit: Payer: Self-pay

## 2020-11-15 ENCOUNTER — Encounter
Admission: RE | Admit: 2020-11-15 | Discharge: 2020-11-15 | Disposition: A | Discharge status: Home / Self Care | Payer: Medicare Other | Source: Ambulatory Visit | Attending: Surgery | Admitting: Surgery

## 2020-11-15 DIAGNOSIS — Z01818 Encounter for other preprocedural examination: Secondary | ICD-10-CM | POA: Insufficient documentation

## 2020-11-15 DIAGNOSIS — Z880 Allergy status to penicillin: Secondary | ICD-10-CM | POA: Insufficient documentation

## 2020-11-15 DIAGNOSIS — R8290 Unspecified abnormal findings in urine: Secondary | ICD-10-CM | POA: Insufficient documentation

## 2020-11-15 DIAGNOSIS — M75121 Complete rotator cuff tear or rupture of right shoulder, not specified as traumatic: Secondary | ICD-10-CM | POA: Diagnosis not present

## 2020-11-15 LAB — URINALYSIS, ROUTINE W REFLEX MICROSCOPIC
Bilirubin Urine: NEGATIVE
Glucose, UA: NEGATIVE mg/dL
Hgb urine dipstick: NEGATIVE
Ketones, ur: NEGATIVE mg/dL
Leukocytes,Ua: NEGATIVE
Nitrite: NEGATIVE
Protein, ur: NEGATIVE mg/dL
Specific Gravity, Urine: 1.025 (ref 1.005–1.030)
pH: 5.5 (ref 5.0–8.0)

## 2020-11-15 LAB — SURGICAL PCR SCREEN
MRSA, PCR: NEGATIVE
Staphylococcus aureus: POSITIVE — AB

## 2020-11-15 LAB — TYPE AND SCREEN
ABO/RH(D): O POS
Antibody Screen: NEGATIVE

## 2020-11-15 NOTE — Progress Notes (Signed)
Perioperative Services Pre-Admission/Anesthesia Testing   Date: 11/15/20 Name: Mariah Hogan MRN:   [id number]  Re: Consideration of preoperative prophylactic antibiotic change   Request sent to: Poggi, Marshall Cork, MD (routed and/or faxed via [facility])  Planned Surgical Procedure(s):    Case: [id number] Date/Time: 11/27/20 0715   Procedure: REVERSE SHOULDER ARTHROPLASTY (Right: Shoulder)   Anesthesia type: Choice   Pre-op diagnosis:      Nontraumatic complete tear of right rotator cuff.     Rotator cuff tendinitis, right   Location: [facility] OR ROOM 03 / [place] [facility] FOR ANESTHESIA GROUP   Surgeons: Corky Mull, MD     Notes: Patient has a documented allergy to PCN G only Advising that PCN G has caused her to experience urticarial rash and swelling in the past.   Received cephalosporin with no documented complications CEFAZOLIN received without ADRs on 10/21/2017 and 01/27/2018  Screened as appropriate for cephalosporin use during medication reconciliation No immediate angioedema, dysphagia, SOB, anaphylaxis symptoms. No severe rash involving mucous membranes or skin necrosis. No hospital admissions related to side effects of PCN/cephalosporin use.  No documented reaction to PCN or cephalosporin in the last 10 years.  Request:  As an evidence based approach to reducing the rate of incidence for post-operative SSI and the development of MDROs, could an agent with narrower coverage for preoperative prophylaxis in this patient's upcoming surgical course be considered?   Currently ordered preoperative prophylactic ABX: clindamycin.   Specifically requesting change to cephalosporin (CEFAZOLIN).   Please communicate decision with me and I will change the orders in Epic as per your direction.   Things to consider: Many patients report that they were "allergic" to PCN earlier in life, however this does not translate into a true lifelong allergy. Patients can lose sensitivity to specific IgE  antibodies over time if PCN is avoided (Kleris & Lugar, 2019).  Up to 10% of the adult population and 15% of hospitalized patients report an allergy to PCN, however clinical studies suggest that 90% of those reporting an allergy can tolerate PCN antibiotics (Kleris & Lugar, 2019).  Cross-sensitivity between PCN and cephalosporins has been documented as being as high as 10%, however this estimation included data believed to have been collected in a setting where there was contamination. Newer data suggests that the prevalence of cross-sensitivity between PCN and cephalosporins is actually estimated to be closer to 1% (Hermanides et al., 2018).   Patients labeled as PCN allergic, whether they are truly allergic or not, have been found to have inferior outcomes in terms of rates of serious infection, and these patients tend to have longer hospital stays ([place], 2019).  Treatment related secondary infections, such as Clostridioides difficile, have been linked to the improper use of broad spectrum antibiotics in patients improperly labeled as PCN allergic (Kleris & Lugar, 2019).  Anaphylaxis from cephalosporins is rare and the evidence suggests that there is no increased risk of an anaphylactic type reaction when cephalosporins are used in a PCN allergic patient (Pichichero, 2006).  Citations: Hermanides J, Lemkes BA, Prins Pearla Dubonnet MW, [place] I. Presumed ?-Lactam Allergy and Cross-reactivity in the Operating Theater: A Practical Approach. Anesthesiology. 2018 Aug;129(2):335-342. doi: 10.1097/ALN.0000000000002252. PMID: 18563149.  Kleris, [place]., & Lugar, P. L. (2019). Things We Do For No Reason: Failing to Question a Penicillin Allergy History. Journal of hospital medicine, 14(10), [phone]. Advance online publication. https://www.wallace-[place].info/  Pichichero, M. E. (2006). Cephalosporins can be prescribed safely for penicillin-allergic patients. Journal of family medicine, 55(2),  106-112. Accessed: https://cdn.mdedge.com/files/s32f-public/Document/September-2017/5502JFP_AppliedEvidence1.pdf   BHonor Loh MSN, APRN, FNP-C, CEN [facility] Peri-operative Services Nurse Practitioner FAX: ([phone]/08/22 2:59 PM

## 2020-11-15 NOTE — Patient Instructions (Addendum)
Your procedure is scheduled on: November 27, 2020 TUESDAY Report to the Registration Desk on the 1st floor of the [facility]. To find out your arrival time, please call [phone] between 1PM - 3PM on: November 26, 2020 MONDAY  REMEMBER: Instructions that are not followed completely may result in serious medical risk, up to and including death; or upon the discretion of your surgeon and anesthesiologist your surgery may need to be rescheduled.  Do not eat food after midnight the night before surgery.  No gum chewing, lozengers or hard candies.  You may however, drink CLEAR liquids up to 2 hours before you are scheduled to arrive for your surgery. Do not drink anything within 2 hours of your scheduled arrival time.  Clear liquids include: - water  - apple juice without pulp - gatorade (not RED, PURPLE, OR BLUE) - black coffee or tea (Do NOT add milk or creamers to the coffee or tea) Do NOT drink anything that is not on this list.  Type 1 and Type 2 diabetics should only drink water.  In addition, your doctor has ordered for you to drink the provided  Ensure Pre-Surgery Clear Carbohydrate Drink  Drinking this carbohydrate drink up to two hours before surgery helps to reduce insulin resistance and improve patient outcomes. Please complete drinking 2 hours prior to scheduled arrival time.  TAKE THESE MEDICATIONS THE MORNING OF SURGERY WITH A SIP OF WATER: ATENOLOL VALACYCLOVIR  GABAPENTIN IF NEEDED PANTOPRAZOLE (take one the night before and one on the morning of surgery - helps to prevent nausea after surgery.)  One week prior to surgery: Stop Anti-inflammatories (NSAIDS) such as Advil, Aleve, Ibuprofen, Motrin, Naproxen, Naprosyn and ASPIRIN OR Aspirin based products such as Excedrin, Goodys Powder, BC Powder. Stop ANY OVER THE COUNTER supplements until after surgery. You may however, continue to take Tylenol if needed for pain up until the day of surgery.  No Alcohol for  24 hours before or after surgery.  No Smoking including e-cigarettes for 24 hours prior to surgery.  No chewable tobacco products for at least 6 hours prior to surgery.  No nicotine patches on the day of surgery.  Do not use any "recreational" drugs for at least a week prior to your surgery.  Please be advised that the combination of cocaine and anesthesia may have negative outcomes, up to and including death. If you test positive for cocaine, your surgery will be cancelled.  On the morning of surgery brush your teeth with toothpaste and water, you may rinse your mouth with mouthwash if you wish. Do not swallow any toothpaste or mouthwash.  Do not wear jewelry, make-up, hairpins, clips or nail polish.  Do not wear lotions, powders, or perfumes OR DEODORANT  Do not shave body from the neck down 48 hours prior to surgery just in case you cut yourself which could leave a site for infection.  Also, freshly shaved skin may become irritated if using the CHG soap.  Contact lenses, hearing aids and dentures may not be worn into surgery.  Do not bring valuables to the hospital. [facility] is not responsible for any missing/lost belongings or valuables.   Use CHG Soap as directed on instruction sheet.  Total Shoulder Arthroplasty:  use Benzolyl Peroxide 5% Gel as directed on instruction sheet.  Notify your doctor if there is any change in your medical condition (cold, fever, infection).  Wear comfortable clothing (specific to your surgery type) to the hospital.  After surgery, you can help  prevent lung complications by doing breathing exercises.  Take deep breaths and cough every 1-2 hours. Your doctor may order a device called an Incentive Spirometer to help you take deep breaths. When coughing or sneezing, hold a pillow firmly against your incision with both hands. This is called "splinting." Doing this helps protect your incision. It also decreases belly discomfort.  If you are being  admitted to the hospital overnight, leave your suitcase in the car. After surgery it may be brought to your room.  If you are being discharged the day of surgery, you will not be allowed to drive home. You will need a responsible adult (18 years or older) to drive you home and stay with you that night.   If you are taking public transportation, you will need to have a responsible adult (18 years or older) with you. Please confirm with your physician that it is acceptable to use public transportation.   Please call the [place] Dept. at [phone] if you have any questions about these instructions.  Surgery Visitation Policy:  Patients undergoing a surgery or procedure may have one family member or support person with them as long as that person is not COVID-19 positive or experiencing its symptoms.  That person may remain in the waiting area during the procedure.  Inpatient Visitation:    Visiting hours are 7 a.m. to 8 p.m. Inpatients will be allowed two visitors daily. The visitors may change each day during the patient's stay. No visitors under the age of 82. Any visitor under the age of 50 must be accompanied by an adult. The visitor must pass COVID-19 screenings, use hand sanitizer when entering and exiting the patient's room and wear a mask at all times, including in the patient's room. Patients must also wear a mask when staff or their visitor are in the room. Masking is required regardless of vaccination status.

## 2020-11-16 LAB — URINE CULTURE: Culture: 40000 — AB

## 2020-11-16 NOTE — Progress Notes (Signed)
  [facility] Perioperative Services: Pre-Admission/Anesthesia Testing  Abnormal Lab Notification   Date: 11/16/20  Name: Mariah Hogan MRN:   [id number]  Re: Abnormal labs noted during PAT appointment   Provider(s) Notified: Milagros Evener, MD Notification mode: Routed and/or faxed via [facility]   ABNORMAL LAB VALUE(S): Lab Results  Component Value Date   CULT (A) 11/15/2020    40,000 COLONIES/mL GROUP B STREP(S.AGALACTIAE)ISOLATED TESTING AGAINST S. AGALACTIAE NOT ROUTINELY PERFORMED DUE TO PREDICTABILITY OF AMP/PEN/VAN SUSCEPTIBILITY. Performed at [facility], [place] [address], [place], [place] [address]    Notes:  Patient is scheduled for a REVERSE SHOULDER ARTHROPLASTY (Right: Shoulder) on 11/27/2020. UA was performed by PCP and it appeared concerning for infection. There was no culture performed. Patient elected to repeat in PAT clinic given her upcoming surgery. UA had improved overall. C&S as per above. Will send to surgeon for review and consideration of treatment.   This is a Community education officer; no formal response is required.  Honor Loh, MSN, APRN, FNP-C, CEN [facility]  Peri-operative Services Nurse Practitioner Phone: [phone] Fax: [phone] 11/16/20 2:47 PM

## 2020-11-16 NOTE — Progress Notes (Signed)
  Perioperative Services Pre-Admission/Anesthesia Testing     Date: 11/16/20  Name: Mariah Hogan MRN:   [id number]  Re: Change in [place] for upcoming surgery   Case: [id number] Date/Time: 11/27/20 0715   Procedure: REVERSE SHOULDER ARTHROPLASTY (Right: Shoulder)   Anesthesia type: Choice   Pre-op diagnosis:      Nontraumatic complete tear of right rotator cuff.     Rotator cuff tendinitis, right   Location: [facility] OR ROOM 03 / [facility] [facility] ANESTH[facility]   Surgeons: Corky Mull, MD     Primary attending surgeon was consulted regarding consideration of therapeutic change in antimicrobial agent being used for preoperative prophylaxis in this patient's upcoming surgical case. Following analysis of the risk versus benefits, Dr. Roland Rack, Marshall Cork, MD advising that it would be acceptable to discontinue the ordered clindamycin and place an order for cefazolin 2 gm IV on call to the OR. Orders for this patient were amended by me following collaborative conversation with attending surgeon taking into consideration of risk versus benefits of the therapy change.  Honor Loh, MSN, APRN, FNP-C, CEN [facility]  Peri-operative Services Nurse Practitioner Phone: [phone] 11/16/20 11:20 AM

## 2020-11-23 ENCOUNTER — Other Ambulatory Visit: Payer: Self-pay

## 2020-11-23 ENCOUNTER — Other Ambulatory Visit
Admission: RE | Admit: 2020-11-23 | Discharge: 2020-11-23 | Disposition: A | Discharge status: Home / Self Care | Payer: Medicare Other | Source: Ambulatory Visit | Attending: Surgery | Admitting: Surgery

## 2020-11-23 DIAGNOSIS — Z20822 Contact with and (suspected) exposure to covid-19: Secondary | ICD-10-CM | POA: Diagnosis not present

## 2020-11-23 DIAGNOSIS — Z01812 Encounter for preprocedural laboratory examination: Secondary | ICD-10-CM | POA: Insufficient documentation

## 2020-11-24 LAB — SARS CORONAVIRUS 2 (TAT 6-24 HRS): SARS Coronavirus 2: NEGATIVE

## 2020-11-27 ENCOUNTER — Encounter: Payer: Self-pay | Admitting: Surgery

## 2020-11-27 ENCOUNTER — Inpatient Hospital Stay
Admission: EM | Admit: 2020-11-27 | Discharge: 2020-12-06 | DRG: 483 | Disposition: A | Discharge status: Skilled Nursing Facility | Payer: Medicare Other | Attending: Surgery | Admitting: Surgery

## 2020-11-27 ENCOUNTER — Inpatient Hospital Stay: Payer: Medicare Other | Admitting: Urgent Care

## 2020-11-27 ENCOUNTER — Encounter
Admission: EM | Disposition: A | Discharge status: Skilled Nursing Facility | Payer: Self-pay | Source: Home / Self Care | Attending: Surgery

## 2020-11-27 ENCOUNTER — Observation Stay: Payer: Medicare Other

## 2020-11-27 ENCOUNTER — Other Ambulatory Visit: Payer: Self-pay

## 2020-11-27 ENCOUNTER — Inpatient Hospital Stay: Payer: Medicare Other

## 2020-11-27 DIAGNOSIS — Z86010 Personal history of colonic polyps: Secondary | ICD-10-CM

## 2020-11-27 DIAGNOSIS — I119 Hypertensive heart disease without heart failure: Secondary | ICD-10-CM | POA: Diagnosis present

## 2020-11-27 DIAGNOSIS — Z888 Allergy status to other drugs, medicaments and biological substances status: Secondary | ICD-10-CM

## 2020-11-27 DIAGNOSIS — Z8261 Family history of arthritis: Secondary | ICD-10-CM

## 2020-11-27 DIAGNOSIS — Z9049 Acquired absence of other specified parts of digestive tract: Secondary | ICD-10-CM

## 2020-11-27 DIAGNOSIS — K529 Noninfective gastroenteritis and colitis, unspecified: Secondary | ICD-10-CM | POA: Diagnosis present

## 2020-11-27 DIAGNOSIS — Z8709 Personal history of other diseases of the respiratory system: Secondary | ICD-10-CM

## 2020-11-27 DIAGNOSIS — J811 Chronic pulmonary edema: Secondary | ICD-10-CM

## 2020-11-27 DIAGNOSIS — M19011 Primary osteoarthritis, right shoulder: Secondary | ICD-10-CM | POA: Diagnosis present

## 2020-11-27 DIAGNOSIS — M75121 Complete rotator cuff tear or rupture of right shoulder, not specified as traumatic: Principal | ICD-10-CM | POA: Diagnosis present

## 2020-11-27 DIAGNOSIS — Z9221 Personal history of antineoplastic chemotherapy: Secondary | ICD-10-CM

## 2020-11-27 DIAGNOSIS — K219 Gastro-esophageal reflux disease without esophagitis: Secondary | ICD-10-CM | POA: Diagnosis present

## 2020-11-27 DIAGNOSIS — Z20822 Contact with and (suspected) exposure to covid-19: Secondary | ICD-10-CM | POA: Diagnosis present

## 2020-11-27 DIAGNOSIS — Z90710 Acquired absence of both cervix and uterus: Secondary | ICD-10-CM

## 2020-11-27 DIAGNOSIS — Z91040 Latex allergy status: Secondary | ICD-10-CM

## 2020-11-27 DIAGNOSIS — Z853 Personal history of malignant neoplasm of breast: Secondary | ICD-10-CM

## 2020-11-27 DIAGNOSIS — R160 Hepatomegaly, not elsewhere classified: Secondary | ICD-10-CM | POA: Diagnosis present

## 2020-11-27 DIAGNOSIS — G5600 Carpal tunnel syndrome, unspecified upper limb: Secondary | ICD-10-CM | POA: Diagnosis present

## 2020-11-27 DIAGNOSIS — Z9012 Acquired absence of left breast and nipple: Secondary | ICD-10-CM

## 2020-11-27 DIAGNOSIS — Z79899 Other long term (current) drug therapy: Secondary | ICD-10-CM

## 2020-11-27 DIAGNOSIS — K449 Diaphragmatic hernia without obstruction or gangrene: Secondary | ICD-10-CM | POA: Diagnosis present

## 2020-11-27 DIAGNOSIS — Z8249 Family history of ischemic heart disease and other diseases of the circulatory system: Secondary | ICD-10-CM

## 2020-11-27 DIAGNOSIS — Z85828 Personal history of other malignant neoplasm of skin: Principal | ICD-10-CM

## 2020-11-27 DIAGNOSIS — T380X5A Adverse effect of glucocorticoids and synthetic analogues, initial encounter: Secondary | ICD-10-CM | POA: Diagnosis present

## 2020-11-27 DIAGNOSIS — Z881 Allergy status to other antibiotic agents status: Secondary | ICD-10-CM

## 2020-11-27 DIAGNOSIS — J986 Disorders of diaphragm: Secondary | ICD-10-CM

## 2020-11-27 DIAGNOSIS — K7581 Nonalcoholic steatohepatitis (NASH): Secondary | ICD-10-CM | POA: Diagnosis present

## 2020-11-27 DIAGNOSIS — T502X5A Adverse effect of carbonic-anhydrase inhibitors, benzothiadiazides and other diuretics, initial encounter: Secondary | ICD-10-CM | POA: Diagnosis present

## 2020-11-27 DIAGNOSIS — R7989 Other specified abnormal findings of blood chemistry: Secondary | ICD-10-CM | POA: Diagnosis not present

## 2020-11-27 DIAGNOSIS — Z419 Encounter for procedure for purposes other than remedying health state, unspecified: Secondary | ICD-10-CM

## 2020-11-27 DIAGNOSIS — Z880 Allergy status to penicillin: Secondary | ICD-10-CM

## 2020-11-27 DIAGNOSIS — R0989 Other specified symptoms and signs involving the circulatory and respiratory systems: Secondary | ICD-10-CM

## 2020-11-27 DIAGNOSIS — M3509 Sicca syndrome with other organ involvement: Secondary | ICD-10-CM | POA: Diagnosis present

## 2020-11-27 DIAGNOSIS — J690 Pneumonitis due to inhalation of food and vomit: Secondary | ICD-10-CM | POA: Diagnosis present

## 2020-11-27 DIAGNOSIS — Z8701 Personal history of pneumonia (recurrent): Secondary | ICD-10-CM

## 2020-11-27 DIAGNOSIS — R059 Cough, unspecified: Secondary | ICD-10-CM

## 2020-11-27 DIAGNOSIS — Z96611 Presence of right artificial shoulder joint: Secondary | ICD-10-CM

## 2020-11-27 HISTORY — PX: REVERSE SHOULDER ARTHROPLASTY: SHX5054

## 2020-11-27 SURGERY — ARTHROPLASTY, SHOULDER, TOTAL, REVERSE
Anesthesia: General | Site: Shoulder | Laterality: Right

## 2020-11-27 MED ORDER — PROPOFOL 10 MG/ML IV BOLUS
INTRAVENOUS | Status: DC | PRN
  Administered 2020-11-27: 150 mg via INTRAVENOUS

## 2020-11-27 MED ORDER — CLINDAMYCIN PHOSPHATE 900 MG/50ML IV SOLN
900.0000 mg | INTRAVENOUS | Status: AC
  Administered 2020-11-27: 900 mg via INTRAVENOUS

## 2020-11-27 MED ORDER — HYDROCODONE-ACETAMINOPHEN 5-325 MG PO TABS
1.0000 | ORAL_TABLET | ORAL | Status: DC | PRN
  Administered 2020-11-29 – 2020-12-02 (×11): 1 via ORAL
  Administered 2020-12-02: 2 via ORAL
  Administered 2020-12-02 – 2020-12-03 (×4): 1 via ORAL
  Administered 2020-12-03: 2 via ORAL
  Administered 2020-12-04 (×3): 1 via ORAL
  Administered 2020-12-04: 2 via ORAL
  Administered 2020-12-04 – 2020-12-05 (×6): 1 via ORAL
  Administered 2020-12-06 (×2): 2 via ORAL
  Administered 2020-12-06: 1 via ORAL
  Filled 2020-11-27: qty 1
  Filled 2020-11-27: qty 2
  Filled 2020-11-27 (×12): qty 1
  Filled 2020-11-27: qty 2
  Filled 2020-11-27 (×4): qty 1
  Filled 2020-11-27 (×2): qty 2
  Filled 2020-11-27: qty 1
  Filled 2020-11-27: qty 2
  Filled 2020-11-27 (×7): qty 1
  Filled 2020-11-27: qty 2

## 2020-11-27 MED ORDER — ORAL CARE MOUTH RINSE: 15.0000 mL | Freq: Once | OROMUCOSAL | Status: AC

## 2020-11-27 MED ORDER — SODIUM CHLORIDE 0.9 % IR SOLN
Status: DC | PRN
  Administered 2020-11-27: 500 mL

## 2020-11-27 MED ORDER — DOCUSATE SODIUM 100 MG PO CAPS
100.0000 mg | ORAL_CAPSULE | Freq: Two times a day (BID) | ORAL | Status: DC
Start: 2020-11-27 — End: 2020-11-30
  Administered 2020-11-27 – 2020-11-30 (×6): 100 mg via ORAL
  Filled 2020-11-27 (×6): qty 1

## 2020-11-27 MED ORDER — CEFAZOLIN SODIUM-DEXTROSE 2-4 GM/100ML-% IV SOLN
INTRAVENOUS | Status: AC
  Filled 2020-11-27: qty 100

## 2020-11-27 MED ORDER — DEXAMETHASONE SODIUM PHOSPHATE 10 MG/ML IJ SOLN
INTRAMUSCULAR | Status: DC | PRN
  Administered 2020-11-27: 10 mg via INTRAVENOUS

## 2020-11-27 MED ORDER — VITAMIN D 25 MCG (1000 UNIT) PO TABS
2000.0000 [IU] | ORAL_TABLET | Freq: Every day | ORAL | Status: DC
  Administered 2020-11-28 – 2020-12-06 (×9): 2000 [IU] via ORAL
  Filled 2020-11-27 (×9): qty 2

## 2020-11-27 MED ORDER — ONDANSETRON HCL 4 MG/2ML IJ SOLN
INTRAMUSCULAR | Status: AC
  Filled 2020-11-27: qty 2

## 2020-11-27 MED ORDER — CALCIUM CARBONATE 1250 (500 CA) MG PO TABS
500.0000 mg | ORAL_TABLET | Freq: Every day | ORAL | Status: DC
  Administered 2020-11-28 – 2020-12-06 (×9): 500 mg via ORAL
  Filled 2020-11-27 (×9): qty 1

## 2020-11-27 MED ORDER — APREPITANT 40 MG PO CAPS: 40.0000 mg | ORAL_CAPSULE | Freq: Once | ORAL | Status: AC

## 2020-11-27 MED ORDER — FENTANYL CITRATE (PF) 100 MCG/2ML IJ SOLN
INTRAMUSCULAR | Status: DC | PRN
  Administered 2020-11-27: 50 ug via INTRAVENOUS
  Administered 2020-11-27 (×2): 25 ug via INTRAVENOUS

## 2020-11-27 MED ORDER — PHENYLEPHRINE HCL (PRESSORS) 10 MG/ML IV SOLN
INTRAVENOUS | Status: AC
  Filled 2020-11-27: qty 1

## 2020-11-27 MED ORDER — FENTANYL CITRATE PF 50 MCG/ML IJ SOSY: 50.0000 ug | PREFILLED_SYRINGE | INTRAMUSCULAR | Status: DC | PRN

## 2020-11-27 MED ORDER — KETOROLAC TROMETHAMINE 15 MG/ML IJ SOLN
7.5000 mg | Freq: Four times a day (QID) | INTRAMUSCULAR | Status: AC
  Administered 2020-11-27 – 2020-11-28 (×3): 7.5 mg via INTRAVENOUS
  Filled 2020-11-27 (×4): qty 1

## 2020-11-27 MED ORDER — MIDAZOLAM HCL 2 MG/2ML IJ SOLN
INTRAMUSCULAR | Status: AC
  Administered 2020-11-27: 1 mg via INTRAVENOUS
  Filled 2020-11-27: qty 2

## 2020-11-27 MED ORDER — ACETAMINOPHEN 325 MG PO TABS
325.0000 mg | ORAL_TABLET | Freq: Four times a day (QID) | ORAL | Status: DC | PRN
  Administered 2020-11-27: 500 mg via ORAL

## 2020-11-27 MED ORDER — SODIUM CHLORIDE FLUSH 0.9 % IV SOLN
INTRAVENOUS | Status: AC
  Filled 2020-11-27: qty 60

## 2020-11-27 MED ORDER — SUGAMMADEX SODIUM 200 MG/2ML IV SOLN
INTRAVENOUS | Status: DC | PRN
  Administered 2020-11-27: 150 mg via INTRAVENOUS

## 2020-11-27 MED ORDER — BUPIVACAINE LIPOSOME 1.3 % IJ SUSP
INTRAMUSCULAR | Status: AC
  Filled 2020-11-27: qty 20

## 2020-11-27 MED ORDER — BUPIVACAINE LIPOSOME 1.3 % IJ SUSP
INTRAMUSCULAR | Status: AC
  Filled 2020-11-27: qty 60

## 2020-11-27 MED ORDER — APREPITANT 40 MG PO CAPS
ORAL_CAPSULE | ORAL | Status: AC
  Administered 2020-11-27: 40 mg via ORAL
  Filled 2020-11-27: qty 1

## 2020-11-27 MED ORDER — ONDANSETRON HCL 4 MG PO TABS: 4.0000 mg | ORAL_TABLET | Freq: Four times a day (QID) | ORAL | Status: DC | PRN

## 2020-11-27 MED ORDER — METOCLOPRAMIDE HCL 5 MG/ML IJ SOLN: 5.0000 mg | Freq: Three times a day (TID) | INTRAMUSCULAR | Status: DC | PRN

## 2020-11-27 MED ORDER — ACETAMINOPHEN 500 MG PO TABS
500.0000 mg | ORAL_TABLET | Freq: Four times a day (QID) | ORAL | Status: AC
  Administered 2020-11-27 – 2020-11-28 (×2): 500 mg via ORAL
  Filled 2020-11-27 (×4): qty 1

## 2020-11-27 MED ORDER — DIPHENHYDRAMINE HCL 12.5 MG/5ML PO ELIX: 12.5000 mg | ORAL_SOLUTION | ORAL | Status: DC | PRN

## 2020-11-27 MED ORDER — CLINDAMYCIN PHOSPHATE 900 MG/50ML IV SOLN
INTRAVENOUS | Status: AC
  Filled 2020-11-27: qty 50

## 2020-11-27 MED ORDER — CHLORHEXIDINE GLUCONATE 0.12 % MT SOLN
OROMUCOSAL | Status: AC
  Administered 2020-11-27: 15 mL via OROMUCOSAL
  Filled 2020-11-27: qty 15

## 2020-11-27 MED ORDER — PANTOPRAZOLE SODIUM 40 MG PO TBEC
40.0000 mg | DELAYED_RELEASE_TABLET | Freq: Two times a day (BID) | ORAL | Status: DC
  Administered 2020-11-27 – 2020-12-01 (×8): 40 mg via ORAL
  Filled 2020-11-27 (×8): qty 1

## 2020-11-27 MED ORDER — PHENYLEPHRINE HCL (PRESSORS) 10 MG/ML IV SOLN
INTRAVENOUS | Status: DC | PRN
  Administered 2020-11-27: 100 ug via INTRAVENOUS
  Administered 2020-11-27 (×5): 50 ug via INTRAVENOUS

## 2020-11-27 MED ORDER — FLEET ENEMA 7-19 GM/118ML RE ENEM: 1.0000 | ENEMA | Freq: Once | RECTAL | Status: DC | PRN

## 2020-11-27 MED ORDER — METOCLOPRAMIDE HCL 10 MG PO TABS: 5.0000 mg | ORAL_TABLET | Freq: Three times a day (TID) | ORAL | Status: DC | PRN

## 2020-11-27 MED ORDER — ROCURONIUM BROMIDE 100 MG/10ML IV SOLN
INTRAVENOUS | Status: DC | PRN
  Administered 2020-11-27: 30 mg via INTRAVENOUS
  Administered 2020-11-27: 50 mg via INTRAVENOUS

## 2020-11-27 MED ORDER — VALACYCLOVIR HCL 500 MG PO TABS
1000.0000 mg | ORAL_TABLET | Freq: Every day | ORAL | Status: DC
  Administered 2020-11-28 – 2020-12-06 (×9): 1000 mg via ORAL
  Filled 2020-11-27 (×9): qty 2

## 2020-11-27 MED ORDER — ONDANSETRON HCL 4 MG/2ML IJ SOLN
INTRAMUSCULAR | Status: DC | PRN
  Administered 2020-11-27: 4 mg via INTRAVENOUS

## 2020-11-27 MED ORDER — TRANEXAMIC ACID 1000 MG/10ML IV SOLN
INTRAVENOUS | Status: DC | PRN
  Administered 2020-11-27: 1000 mg via TOPICAL

## 2020-11-27 MED ORDER — MAGNESIUM HYDROXIDE 400 MG/5ML PO SUSP
30.0000 mL | Freq: Every day | ORAL | Status: DC | PRN
  Administered 2020-11-29 – 2020-12-04 (×3): 30 mL via ORAL
  Filled 2020-11-27 (×5): qty 30

## 2020-11-27 MED ORDER — ENOXAPARIN SODIUM 40 MG/0.4ML IJ SOSY
40.0000 mg | PREFILLED_SYRINGE | INTRAMUSCULAR | Status: DC
  Administered 2020-11-28 – 2020-12-06 (×9): 40 mg via SUBCUTANEOUS
  Filled 2020-11-27 (×8): qty 0.4

## 2020-11-27 MED ORDER — CITALOPRAM HYDROBROMIDE 20 MG PO TABS
20.0000 mg | ORAL_TABLET | Freq: Every day | ORAL | Status: DC
  Administered 2020-11-27 – 2020-12-05 (×9): 20 mg via ORAL
  Filled 2020-11-27 (×11): qty 1

## 2020-11-27 MED ORDER — ONDANSETRON HCL 4 MG/2ML IJ SOLN: 4.0000 mg | Freq: Once | INTRAMUSCULAR | Status: DC | PRN

## 2020-11-27 MED ORDER — TRANEXAMIC ACID 1000 MG/10ML IV SOLN
INTRAVENOUS | Status: AC
  Filled 2020-11-27: qty 10

## 2020-11-27 MED ORDER — BUPIVACAINE HCL (PF) 0.5 % IJ SOLN
INTRAMUSCULAR | Status: AC
  Filled 2020-11-27: qty 10

## 2020-11-27 MED ORDER — MORPHINE SULFATE (PF) 2 MG/ML IV SOLN: 0.5000 mg | INTRAVENOUS | Status: DC | PRN

## 2020-11-27 MED ORDER — SODIUM CHLORIDE 0.9 % IV SOLN: INTRAVENOUS | Status: DC

## 2020-11-27 MED ORDER — LACTATED RINGERS IV SOLN: INTRAVENOUS | Status: DC

## 2020-11-27 MED ORDER — LIDOCAINE HCL (CARDIAC) PF 100 MG/5ML IV SOSY
PREFILLED_SYRINGE | INTRAVENOUS | Status: DC | PRN
  Administered 2020-11-27: 60 mg via INTRAVENOUS

## 2020-11-27 MED ORDER — CLINDAMYCIN PHOSPHATE 600 MG/50ML IV SOLN
600.0000 mg | Freq: Four times a day (QID) | INTRAVENOUS | Status: AC
Start: 2020-11-27 — End: 2020-11-28
  Administered 2020-11-27 (×2): 600 mg via INTRAVENOUS
  Filled 2020-11-27 (×2): qty 50

## 2020-11-27 MED ORDER — MIDAZOLAM HCL 2 MG/2ML IJ SOLN: 1.0000 mg | INTRAMUSCULAR | Status: DC | PRN

## 2020-11-27 MED ORDER — KETOROLAC TROMETHAMINE 15 MG/ML IJ SOLN
INTRAMUSCULAR | Status: AC
  Filled 2020-11-27: qty 1

## 2020-11-27 MED ORDER — BUPIVACAINE HCL (PF) 0.5 % IJ SOLN
INTRAMUSCULAR | Status: DC | PRN
  Administered 2020-11-27: 10 mL via PERINEURAL

## 2020-11-27 MED ORDER — LIDOCAINE HCL (PF) 1 % IJ SOLN
INTRAMUSCULAR | Status: AC
  Filled 2020-11-27: qty 5

## 2020-11-27 MED ORDER — SODIUM CHLORIDE 0.9 % IV SOLN
INTRAVENOUS | Status: DC | PRN
  Administered 2020-11-27: 50 ug/min via INTRAVENOUS

## 2020-11-27 MED ORDER — STERILE WATER FOR IRRIGATION IR SOLN
Status: DC | PRN
  Administered 2020-11-27: 1000 mL

## 2020-11-27 MED ORDER — FENTANYL CITRATE (PF) 100 MCG/2ML IJ SOLN
INTRAMUSCULAR | Status: AC
  Filled 2020-11-27: qty 2

## 2020-11-27 MED ORDER — MIDAZOLAM HCL 2 MG/2ML IJ SOLN
INTRAMUSCULAR | Status: AC
  Filled 2020-11-27: qty 2

## 2020-11-27 MED ORDER — ONDANSETRON HCL 4 MG/2ML IJ SOLN: 4.0000 mg | Freq: Four times a day (QID) | INTRAMUSCULAR | Status: DC | PRN

## 2020-11-27 MED ORDER — PROPOFOL 10 MG/ML IV BOLUS
INTRAVENOUS | Status: AC
  Filled 2020-11-27: qty 20

## 2020-11-27 MED ORDER — FENTANYL CITRATE (PF) 100 MCG/2ML IJ SOLN: 25.0000 ug | INTRAMUSCULAR | Status: DC | PRN

## 2020-11-27 MED ORDER — BUPIVACAINE-EPINEPHRINE (PF) 0.5% -1:200000 IJ SOLN
INTRAMUSCULAR | Status: DC | PRN
  Administered 2020-11-27: 30 mL via PERINEURAL

## 2020-11-27 MED ORDER — CHLORHEXIDINE GLUCONATE 0.12 % MT SOLN: 15.0000 mL | Freq: Once | OROMUCOSAL | Status: AC

## 2020-11-27 MED ORDER — SODIUM CHLORIDE 0.9 % IR SOLN
Status: DC | PRN
  Administered 2020-11-27: 3000 mL

## 2020-11-27 MED ORDER — LIDOCAINE HCL (PF) 1 % IJ SOLN
INTRAMUSCULAR | Status: DC | PRN
  Administered 2020-11-27: 3 mL via SUBCUTANEOUS

## 2020-11-27 MED ORDER — ATENOLOL 25 MG PO TABS
50.0000 mg | ORAL_TABLET | ORAL | Status: DC
  Administered 2020-11-28 – 2020-11-29 (×2): 50 mg via ORAL
  Filled 2020-11-27 (×2): qty 2

## 2020-11-27 MED ORDER — KETOROLAC TROMETHAMINE 15 MG/ML IJ SOLN
15.0000 mg | Freq: Once | INTRAMUSCULAR | Status: AC
  Administered 2020-11-27: 15 mg via INTRAVENOUS

## 2020-11-27 MED ORDER — BUPIVACAINE LIPOSOME 1.3 % IJ SUSP
INTRAMUSCULAR | Status: DC | PRN
  Administered 2020-11-27: 20 mL via PERINEURAL

## 2020-11-27 MED ORDER — TRIAMTERENE-HCTZ 37.5-25 MG PO TABS
0.5000 | ORAL_TABLET | Freq: Every day | ORAL | Status: DC
  Administered 2020-11-28 – 2020-11-29 (×2): 0.5 via ORAL
  Filled 2020-11-27 (×3): qty 0.5

## 2020-11-27 MED ORDER — FENTANYL CITRATE PF 50 MCG/ML IJ SOSY
PREFILLED_SYRINGE | INTRAMUSCULAR | Status: AC
  Administered 2020-11-27: 25 ug via INTRAVENOUS
  Filled 2020-11-27: qty 1

## 2020-11-27 MED ORDER — CEFAZOLIN SODIUM-DEXTROSE 2-4 GM/100ML-% IV SOLN
2.0000 g | Freq: Once | INTRAVENOUS | Status: AC
  Administered 2020-11-27: 2 g via INTRAVENOUS

## 2020-11-27 MED ORDER — BISACODYL 10 MG RE SUPP: 10.0000 mg | Freq: Every day | RECTAL | Status: DC | PRN

## 2020-11-27 MED ORDER — LIDOCAINE HCL (PF) 2 % IJ SOLN
INTRAMUSCULAR | Status: AC
  Filled 2020-11-27: qty 5

## 2020-11-27 MED ORDER — GABAPENTIN 300 MG PO CAPS
300.0000 mg | ORAL_CAPSULE | Freq: Three times a day (TID) | ORAL | Status: DC
  Administered 2020-11-27 – 2020-12-03 (×18): 300 mg via ORAL
  Filled 2020-11-27 (×12): qty 1
  Filled 2020-11-27: qty 3
  Filled 2020-11-27 (×5): qty 1

## 2020-11-27 MED ORDER — BUPIVACAINE-EPINEPHRINE (PF) 0.5% -1:200000 IJ SOLN
INTRAMUSCULAR | Status: AC
  Filled 2020-11-27: qty 90

## 2020-11-27 SURGICAL SUPPLY — 64 items
BLADE SAW SAG 25X90X1.19 (BLADE) ×2 IMPLANT
BNDG COHESIVE 4X5 TAN ST LF (GAUZE/BANDAGES/DRESSINGS) ×2 IMPLANT
CHLORAPREP W/TINT 26 (MISCELLANEOUS) ×2 IMPLANT
COOLER POLAR GLACIER W/PUMP (MISCELLANEOUS) ×2 IMPLANT
COVER BACK TABLE REUSABLE LG (DRAPES) ×2 IMPLANT
DRAPE 3/4 80X56 (DRAPES) ×4 IMPLANT
DRAPE IMP U-DRAPE 54X76 (DRAPES) ×4 IMPLANT
DRAPE INCISE IOBAN 66X45 STRL (DRAPES) ×4 IMPLANT
DRSG OPSITE POSTOP 4X8 (GAUZE/BANDAGES/DRESSINGS) ×2 IMPLANT
ELECT BLADE 6.5 EXT (BLADE) IMPLANT
ELECT CAUTERY BLADE 6.4 (BLADE) ×2 IMPLANT
ELECT REM PT RETURN 9FT ADLT (ELECTROSURGICAL) ×2
ELECTRODE REM PT RTRN 9FT ADLT (ELECTROSURGICAL) ×1 IMPLANT
GAUZE XEROFORM 1X8 LF (GAUZE/BANDAGES/DRESSINGS) ×2 IMPLANT
GLENOSPHERE RSS 2 CONCENTRIC (Shoulder) ×2 IMPLANT
GLOVE SRG 8 PF TXTR STRL LF DI (GLOVE) ×2 IMPLANT
GLOVE SURG ENC MOIS LTX SZ7.5 (GLOVE) ×8 IMPLANT
GLOVE SURG ENC MOIS LTX SZ8 (GLOVE) ×8 IMPLANT
GLOVE SURG UNDER LTX SZ8 (GLOVE) ×2 IMPLANT
GLOVE SURG UNDER POLY LF SZ8 (GLOVE) ×2
GOWN STRL REUS W/ TWL LRG LVL3 (GOWN DISPOSABLE) ×1 IMPLANT
GOWN STRL REUS W/ TWL XL LVL3 (GOWN DISPOSABLE) ×1 IMPLANT
GOWN STRL REUS W/TWL LRG LVL3 (GOWN DISPOSABLE) ×1
GOWN STRL REUS W/TWL XL LVL3 (GOWN DISPOSABLE) ×1
ILLUMINATOR WAVEGUIDE N/F (MISCELLANEOUS) IMPLANT
IV NS IRRIG 3000ML ARTHROMATIC (IV SOLUTION) ×2 IMPLANT
KIT STABILIZATION SHOULDER (MISCELLANEOUS) ×2 IMPLANT
KIT TURNOVER KIT A (KITS) ×2 IMPLANT
LINER STD +3S RSS HXL (Liner) ×2 IMPLANT
MANIFOLD NEPTUNE II (INSTRUMENTS) ×2 IMPLANT
MASK FACE SPIDER DISP (MASK) ×2 IMPLANT
MAT ABSORB  FLUID 56X50 GRAY (MISCELLANEOUS) ×1
MAT ABSORB FLUID 56X50 GRAY (MISCELLANEOUS) ×1 IMPLANT
NDL SAFETY ECLIPSE 18X1.5 (NEEDLE) ×1 IMPLANT
NEEDLE HYPO 18GX1.5 SHARP (NEEDLE) ×1
NEEDLE HYPO 22GX1.5 SAFETY (NEEDLE) ×2 IMPLANT
NEEDLE SPNL 20GX3.5 QUINCKE YW (NEEDLE) ×2 IMPLANT
NS IRRIG 500ML POUR BTL (IV SOLUTION) ×2 IMPLANT
PACK ARTHROSCOPY SHOULDER (MISCELLANEOUS) ×2 IMPLANT
PAD ARMBOARD 7.5X6 YLW CONV (MISCELLANEOUS) ×2 IMPLANT
PAD WRAPON POLAR SHDR UNIV (MISCELLANEOUS) ×1 IMPLANT
PENCIL SMOKE EVACUATOR (MISCELLANEOUS) ×2 IMPLANT
PLATE BASE REVERSE RSS S (Plate) ×2 IMPLANT
PULSAVAC PLUS IRRIG FAN TIP (DISPOSABLE) ×2
SCREW 4.5X15 RSS W CAP (Screw) ×4 IMPLANT
SCREW 4.5X20 RSS W CAP (Screw) ×2 IMPLANT
SCREW 4.5X25 RSS W CAP (Screw) ×4 IMPLANT
SCREW BODY REVERSE STD (Screw) ×2 IMPLANT
SLING ULTRA II M (MISCELLANEOUS) ×2 IMPLANT
SPONGE T-LAP 18X18 ~~LOC~~+RFID (SPONGE) ×4 IMPLANT
STAPLER SKIN PROX 35W (STAPLE) ×2 IMPLANT
STEM PRESS FIT SZ 07 TSS (Stem) ×2 IMPLANT
SUT ETHIBOND 0 MO6 C/R (SUTURE) ×2 IMPLANT
SUT FIBERWIRE #2 38 BLUE 1/2 (SUTURE) ×6
SUT VIC AB 0 CT1 36 (SUTURE) ×2 IMPLANT
SUT VIC AB 2-0 CT1 27 (SUTURE) ×2
SUT VIC AB 2-0 CT1 TAPERPNT 27 (SUTURE) ×2 IMPLANT
SUTURE FIBERWR #2 38 BLUE 1/2 (SUTURE) ×3 IMPLANT
SYR 10ML LL (SYRINGE) ×2 IMPLANT
SYR 30ML LL (SYRINGE) ×2 IMPLANT
TIP FAN IRRIG PULSAVAC PLUS (DISPOSABLE) ×1 IMPLANT
TOWEL OR 17X26 4PK STRL BLUE (TOWEL DISPOSABLE) ×2 IMPLANT
WATER STERILE IRR 500ML POUR (IV SOLUTION) ×2 IMPLANT
WRAPON POLAR PAD SHDR UNIV (MISCELLANEOUS) ×2

## 2020-11-27 NOTE — Plan of Care (Signed)
  Problem: Education: Goal: Knowledge of General Education information will improve Description: Including pain rating scale, medication(s)/side effects and non-pharmacologic comfort measures Outcome: Progressing   Problem: Activity: Goal: Risk for activity intolerance will decrease Outcome: Progressing   Problem: Nutrition: Goal: Adequate nutrition will be maintained Outcome: Progressing   Problem: Coping: Goal: Level of anxiety will decrease Outcome: Progressing   

## 2020-11-27 NOTE — Anesthesia Procedure Notes (Signed)
Anesthesia Regional Block: Interscalene brachial plexus block   Pre-Anesthetic Checklist: , timeout performed,  Correct Patient, Correct Site, Correct Laterality,  Correct Procedure, Correct Position, site marked,  Risks and benefits discussed,  Surgical consent,  Pre-op evaluation,  At surgeon's request and post-op pain management  Laterality: Right  Prep: chloraprep       Needles:  Injection technique: Single-shot  Needle Type: Stimiplex     Needle Length: 10cm  Needle Gauge: 20     Additional Needles:   Procedures:, nerve stimulator,,, ultrasound used (permanent image in chart),,    Narrative:  Injection made incrementally with aspirations every 5 mL.  Performed by: Personally   Additional Notes: Functioning IV was confirmed and monitors were applied.Sterile prep and drape,hand hygiene and sterile gloves were used.  Negative aspiration and negative test dose prior to incremental administration of local anesthetic. The patient tolerated the procedure well.

## 2020-11-27 NOTE — Evaluation (Signed)
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  PMH includes: HTN, anemia, skin CA, and breast CA.   Clinical Impression  Pt was pleasant and motivated to participate during the session but was somewhat anxious and fearful of falling during standing activities.  Pt put forth good effort, however, and despite needing physical assistance to come to standing was able to amb 10 feet in the room with slow but steady cadence.  Pt's BLE's were somewhat tremulous while walking but no instability or buckling noted.  Pt will benefit from PT services in a SNF setting upon discharge to safely address deficits listed in patient problem list for decreased caregiver assistance and eventual return to PLOF.         Recommendations for follow up therapy are one component of a multi-disciplinary discharge planning process, led by the attending physician.  Recommendations may be updated based on patient status, additional functional criteria and insurance authorization.  Follow Up Recommendations SNF;Supervision for mobility/OOB    Equipment Recommendations  Other (comment) (Pt owns no AD; TBD at next venue of care)    Recommendations for Other Services       Precautions / Restrictions Precautions Precautions: Fall;Shoulder Shoulder Interventions: Shoulder sling/immobilizer Restrictions Weight Bearing Restrictions: Yes RUE Weight Bearing: Non weight bearing      Mobility  Bed Mobility Overal bed mobility: Needs Assistance Bed Mobility: Supine to Sit;Sit to Supine     Supine to sit: Min assist Sit to supine: Supervision   General bed mobility comments: Min A for trunk control during sup to sit with pt able to go sit to supine  with extra time and effort but no physial assist needed    Transfers Overall transfer level: Needs assistance Equipment used: Straight cane Transfers: Sit to/from Stand Sit to Stand: From elevated surface;Min assist;Mod assist         General transfer comment: Multiple sit to/from stands from the EOB with min to mod A needed to come to full upright standing position  Ambulation/Gait Ambulation/Gait assistance: Min guard Gait Distance (Feet): 10 Feet Assistive device: Straight cane Gait Pattern/deviations: Step-through pattern;Decreased step length - right;Decreased step length - left Gait velocity: decreased   General Gait Details: Pt able to amb in the room around 10 feet with very slow, cautious cadence and short B step length with BLEs mildly tremulous but no LOB or buckling noted  Stairs            Wheelchair Mobility    Modified Rankin (Stroke Patients Only)       Balance Overall balance assessment: Needs assistance   Sitting balance-Leahy Scale: Good     Standing balance support: Single extremity supported;During functional activity Standing balance-Leahy Scale: Fair                               Pertinent Vitals/Pain Pain Assessment: No/denies pain    Home Living Family/patient expects to be discharged to:: Private residence Living Arrangements: Alone Available Help at Discharge: Family;Available PRN/intermittently Type of Home: House Home Access: Stairs to enter Entrance Stairs-Rails: Left Entrance Stairs-Number of Steps: 4 Home Layout: One level Home Equipment: None      Prior Function Level of Independence: Independent  Comments: Ind amb community distances without an AD, no fall history, Ind with ADLs.  Was walking 3 miles/day for exercise up until about a year ago with that distance now limited by L knee pain for which the pt stated she will be having a TKA for when her shoulder is healed.     Hand Dominance         Extremity/Trunk Assessment   Upper Extremity Assessment Upper Extremity Assessment: RUE deficits/detail RUE Deficits / Details: No sensation to light touch in the upper R arm; pt able to move fingers freely RUE: Unable to fully assess due to immobilization RUE Sensation: decreased light touch    Lower Extremity Assessment Lower Extremity Assessment: Generalized weakness       Communication   Communication: No difficulties  Cognition Arousal/Alertness: Awake/alert Behavior During Therapy: WFL for tasks assessed/performed Overall Cognitive Status: Within Functional Limits for tasks assessed                                        General Comments      Exercises Other Exercises Other Exercises: Pt education provided on RUE shoulder precautions and importance of scanning the environment during mobility to prevent bumping arm into objects   Assessment/Plan    PT Assessment Patient needs continued PT services  PT Problem List Decreased strength;Decreased activity tolerance;Decreased balance;Decreased mobility;Decreased knowledge of use of DME;Decreased knowledge of precautions       PT Treatment Interventions DME instruction;Gait training;Stair training;Functional mobility training;Therapeutic activities;Therapeutic exercise;Balance training;Patient/family education    PT Goals (Current goals can be found in the Care Plan section)  Acute Rehab PT Goals Patient Stated Goal: To be able to cook and care for myself PT Goal Formulation: With patient Time For Goal Achievement: 12/10/20 Potential to Achieve Goals: Good    Frequency BID   Barriers to discharge Inaccessible home environment;Decreased caregiver support      Co-evaluation               AM-PAC PT "6 Clicks" Mobility  Outcome Measure Help needed turning from your back to your side while in a flat bed without using bedrails?: A Little Help needed moving from lying on your back to sitting  on the side of a flat bed without using bedrails?: A Little Help needed moving to and from a bed to a chair (including a wheelchair)?: A Little Help needed standing up from a chair using your arms (e.g., wheelchair or bedside chair)?: A Little Help needed to walk in hospital room?: A Little Help needed climbing 3-5 steps with a railing? : A Lot 6 Click Score: 17    End of Session Equipment Utilized During Treatment: Gait belt Activity Tolerance: Patient tolerated treatment well Patient left: in bed;with call bell/phone within reach;with bed alarm set;Other (comment) (Polar care donned to RUE; pt declined up in chair) Nurse Communication: Mobility status;Weight bearing status PT Visit Diagnosis: Unsteadiness on feet (R26.81);Difficulty in walking, not elsewhere classified (R26.2);Muscle weakness (generalized) (M62.81)    Time: 3818-2993 PT Time Calculation (min) (ACUTE ONLY): 47 min   Charges:   PT Evaluation $PT Eval Moderate Complexity: 1 Mod PT Treatments $Therapeutic Activity: 8-22 mins       D. Royetta Asal PT, DPT 11/27/20, 5:35 PM

## 2020-11-27 NOTE — H&P (Signed)
History of Present Illness: Mariah Hogan is a 70 y.o. who presents today for a history and physical. She is to undergo a right total shoulder reverse on 11/27/2020. Last seen in clinic on 10/01/2020. There is been no change in her condition since that time.  She was initially seen on 05/21/2020 for right shoulder pathology. Received a cortisone injection with minimal improvement. She then was referred for MRI. MRI showed a complete supraspinatus tendon tear with retraction with degenerative joint disease. She was referred to Dr. Roland Rack for evaluation. After reviewing the MRI and discussion with the patient, she agreed to proceed with a right total shoulder reverse surgery. She had denied any reinjuries to the shoulder or any numbness, or paresthesias to the upper extremity.  Past Medical History:   Allergic state   Arthritis (?inflammatory, trigger nodule, plantar fasciitis)   Asthma without status asthmaticus, unspecified   Basal cell carcinoma on tip on nose   Breast cancer (CMS-HCC) 09/2017  1.8 cm T1c,N0, ER: 90%; PR:80%; Her 2 neu not over-expressed. Mammoprint: High Risk. VUS on genetic testing.   Carpal tunnel syndrome   Chronic diarrhea, unspecified   Colon polyp   Epistaxis   Esophageal reflux   GERD (gastroesophageal reflux disease)   Gross hematuria (Eval Dr. Jacqlyn Larsen with cystoscopy WNL except cystitis changes)   H/O Sjogren's disease (CMS-HCC)   Headache   History of pneumonia   Hyperlipidemia   Hypertension 05/2003   Sjogren's syndrome with other organ involvement (CMS-HCC)  Affects Salivary Gland   Tonsillitis   Urinary incontinence s/p procedures   Past Surgical History:  Basal cell carcinoma removal x4   Bladder surgery for incontinence July 2002, 2010, 2011 (Dr. Jacqlyn Larsen)   Breast cyst x 3   Carpal tunnel x 2   CHOLECYSTECTOMY 02/2007 (laparoscopic)   COLONOSCOPY 08/18/2005 (FH Colon Polyps (Father))   COLONOSCOPY 09/18/2010 (Adenomatous Polyp, FH Colon Polyps (Father): CBF  09/2015)   COLONOSCOPY 01/12/2015 (Adenomatous Polyp, FH Colon Polyps (Father): CBF 01/2020)   COLONOSCOPY 02/06/2020 (Normal colon/PHx CP/Repeat 27yr/CTL)   Deviated septum 1983   EGD 08/20/2011, 09/18/2010, 06/12/2006   EGD 01/12/2015 (Candidal Esophagitis: No repeat per RTE)   EGD 02/06/2020 (Fundic gland polyps/Repeat as needed/CTL)   HYSTERECTOMY 10/1996 (TAH/BSO, Dr. DEnzo Bi   MASTECTOMY PARTIAL / LUMPECTOMY Bilateral   MASTECTOMY SIMPLE Left 08/04/2017 (1.8 cm T1c,N0, ER: 90%; PR:80%; Her 2 neu not over-expressed. Mammoprint: High Risk. VUS on genetic testing)   TONSILLECTOMY AND ADENOIDECTOMY 1956   Past Family History:  Stroke Mother   Diabetes Mother   Hypothyroidism Mother   Allergies Mother   Heart failure Mother   Hyperlipidemia (Elevated cholesterol) Mother   High blood pressure (Hypertension) Mother   Hypothyroidism Father   High blood pressure (Hypertension) Father   Heart failure Father   Rheum arthritis Father   Osteoporosis (Thinning of bones) Father   Gout Father   Stroke Father   Ulcers Father   Hyperthyroidism Father   Colon polyps Father   Asthma Father   Allergies Father   Crohn's disease Father   Irritable bowel syndrome Father   Breast cancer Sister   Breast cancer Paternal Grandmother   Colon cancer Cousin   Breast cancer Cousin   Medications:  atenoloL (TENORMIN) 50 MG tablet   calcium carbonate (CALCIUM 500 ORAL) Take 1 tablet by mouth once daily   cholecalciferol, vitamin D3, (VITAMIN D3 ORAL) Take 1 tablet by mouth once daily   citalopram (CELEXA) 20 MG tablet Take 1.5 tablets (30 mg  total) by mouth once daily 135 tablet 1   conjugated estrogens (PREMARIN) 0.625 mg/gram vaginal cream Insert pea size amount nightly x 2 weeks then every other night x 2 weeks, then 2-3 times weekly for maintenance 42.5 g 3   pantoprazole (PROTONIX) 40 MG DR tablet Take 1 tablet (40 mg total) by mouth once daily For stomach. 180 tablet 1   pantoprazole  (PROTONIX) 40 MG DR tablet Take 40 mg by mouth 2 (two) times daily   triamterene-hydrochlorothiazide (MAXZIDE-25) 37.5-25 mg tablet Take 1 tablet by mouth once daily 90 tablet 1   valACYclovir (VALTREX) 1000 MG tablet Take 1,000 mg by mouth once daily 11   anastrozole (ARIMIDEX) 1 mg tablet Take 1 tablet by mouth once daily (Patient not taking: No sig reported)   atenoloL (TENORMIN) 50 MG tablet Take 50 mg by mouth every morning (Patient not taking: Reported on 11/19/2020)   azelastine (ASTEPRO) 0.15 % (205.5 mcg) nasal spray (Patient not taking: No sig reported)   cetirizine (ZYRTEC) 10 MG tablet Take 1 tablet (10 mg total) by mouth once daily (Patient not taking: No sig reported) 90 tablet 1   codeine-guaifenesin 10-100 mg/5 mL oral liquid Take 5 mLs by mouth every 6 (six) hours as needed for Cough (Patient not taking: No sig reported) 120 mL 0   cyanocobalamin (VITAMIN B12) 1000 MCG tablet Take 1,000 mcg by mouth once daily (Patient not taking: No sig reported)   desoximetasone (TOPICORT) 0.25 % ointment (Patient not taking: No sig reported)   fluticasone propionate (FLONASE) 50 mcg/actuation nasal spray Place 2 sprays into both nostrils once daily (Patient not taking: No sig reported)   fluticasone propionate (FLOVENT HFA) 110 mcg/actuation inhaler Inhale 1 inhalation into the lungs 2 (two) times daily Rinse mouth after use (Patient not taking: No sig reported) 12 g 2   gabapentin (NEURONTIN) 100 MG capsule Take 1 capsule (100 mg total) by mouth 3 (three) times daily (Patient not taking: No sig reported) 90 capsule 2   gabapentin (NEURONTIN) 300 MG capsule Take 1 capsule (300 mg total) by mouth 3 (three) times daily I (Patient not taking: Reported on 11/19/2020) 90 capsule 2   hydrOXYchloroQUINE (PLAQUENIL) 200 mg tablet Take 1 tablet (200 mg total) by mouth once daily (Patient not taking: Reported on 11/19/2020) 30 tablet 5   meloxicam (MOBIC) 15 MG tablet Take 1 tablet (15 mg total) by mouth once  daily Take with meals. (Patient not taking: No sig reported) 90 tablet 1   multivitamin tablet Take 1 tablet by mouth once daily. (Patient not taking: No sig reported)   predniSONE (DELTASONE) 10 mg tablet pack 6 day taper. Take as directed with food (Patient not taking: No sig reported) 21 tablet 0   predniSONE (DELTASONE) 5 MG tablet 6 pills x1day, 5x1,4x1,3x1,2x1,1x1 (Patient not taking: No sig reported) 21 tablet 0   sodium, potassium, and magnesium (SUPREP) oral solution Take 1 Bottle by mouth as directed One kit contains 2 bottles. Take both bottles at the times instructed by your provider. (Patient not taking: No sig reported) 354 mL 0   sucralfate (CARAFATE) 1 gram tablet Take 1 tablet (1 g total) by mouth 3 (three) times a day (Patient not taking: No sig reported) 270 tablet 0   Allergies:  Docetaxel Shortness Of Breath, Other (See Comments) and Palpitations  Patient had heart problems, shortness of breath, passed out Back pain, Back Spasms   Penicillin Shortness Of Breath, Swelling and Rash   Penicillin G Other (  See Comments), Rash, Shortness Of Breath and Swelling   Ciprofloxacin Hives, Itching and Rash   Benzylparaben Other (Positive patch test)   Paraben Other (Positive patch test)   Shellac Other (Positive patch test)   Fluocinonide Rash   Latex Rash   Other Other (See Comments) and Rash  Dodecyl gallate: Positive patch test  Elta MD UV Clear SPF 46: Positive patch test  fragrance Dodecyl gallate: Positive patch test  Elta MD UV Clear SPF 46: Positive patch test  fragrance dodocylgallate dodocylgallate Dodocyl gallate unable to find in formulary.  Dodecyl gallate: Positive patch test  Elta MD UV Clear SPF 46: Positive patch test  fragrance dodocylgallate  Tapentadol Dermatitis   Triamcinolone Rash   Review of Systems A comprehensive 14 point ROS was performed, reviewed, and the pertinent orthopaedic findings are documented in the HPI.  Physical Exam: BP 128/80  (BP Location: Left upper arm, Patient Position: Sitting, BP Cuff Size: Adult)  Ht 168.9 cm (5' 6.5")  Wt 75.5 kg (166 lb 6.4 oz)  BMI 26.46 kg/m   General: Well-developed well-nourished female seen in no acute distress.   HEENT: Atraumatic,normocephalic. Pupils are equal and reactive to light. Oropharynx is clear with moist mucosa  Lungs: Clear to auscultation bilaterally   Cardiovascular: Regular rate and rhythm. Normal S1, S2. No murmurs. No appreciable gallops or rubs. Peripheral pulses are palpable.  Abdomen: Soft, non-tender, nondistended. Bowel sounds present  Extremity: Right shoulder exam: SKIN: Normal SWELLING: None WARMTH: None LYMPH NODES: No adenopathy palpable CREPITUS: None TENDERNESS: Mildly tender over anterior shoulder along bicipital groove ROM (active):      Forward flexion: 165 degrees    Abduction: 160 degrees    Internal rotation: L1 ROM (passive):      Forward flexion: 170 degrees    Abduction: 165 degrees       ER/IR at 90 abd: 90 degrees / 65 degrees   She describes mild pain at the extremes of forward flexion, abduction, and internal rotation at 90 degrees of abduction.   STRENGTH:   Forward flexion: 3+-4/5                         Abduction: 3+-4/5                         External rotation: 3+-4/5                         Internal rotation: 4-4+/5                         Pain with RC testing: Mild pain with resisted dorsiflexion, abduction, and external rotation   STABILITY: Normal   SPECIAL TESTS:       Luan Pulling' test: Mildly positive                                     Speed's test: Not evaluated                                     Capsulitis - pain w/ passive ER: No  Crossed arm test: Mildly positive                                     Crank: Not evaluated                                     Anterior apprehension: Negative                                     Posterior apprehension: Not  evaluated  Neurological: The patient is alert and oriented Sensation to light touch appears to be intact and within normal limits Gross motor strength appeared to be equal to 5/5  Vascular : Peripheral pulses felt to be palpable. Capillary refill appears to be intact and within normal limits  X-ray: MRI OF THE RIGHT SHOULDER WITHOUT CONTRAST: Marked appearing rotator cuff tendinopathy with a complete supraspinatus tendon tear. Retraction is 2-3 cm. There is mild fatty atrophy of the supraspinatus. Moderate acromioclavicular osteoarthritis. Small subacromial spur noted. Moderate volume of debris containing subacromial/subdeltoid fluid consistent with bursitis.  Impression: 1. Nontraumatic complete tear of right rotator cuff 2. Degenerative joint disease right shoulder  Plan: The treatment options, including both surgical and nonsurgical choices, have been discussed in detail with the patient. The patient would like to proceed with surgical intervention, specifically a reverse right total shoulder arthroplasty. The risks (including bleeding, infection, nerve and/or blood vessel injury, persistent or recurrent pain, loosening or failure of the components, limited range of motion, residual weakness, dislocation, need for further surgery, blood clots, strokes, heart attacks or arrhythmias, pneumonia, etc.) and benefits of the surgical procedure were discussed. The patient states her understanding and agrees to proceed. She agrees to a blood transfusion if necessary. A formal written consent will be obtained by the nursing staff.   H&P reviewed and patient re-examined. No changes.

## 2020-11-27 NOTE — Op Note (Signed)
11/27/2020  9:44 AM  Patient:   Mariah Hogan  Pre-Op Diagnosis:   Massive irreparable rotator cuff tear with early cuff arthropathy, right shoulder.  Post-Op Diagnosis:   Same.  Procedure:   Reverse right total shoulder arthroplasty.  Surgeon:   Pascal Lux, MD  Assistant:   Cameron Proud, PA-C; Rennis Chris, PA-S  Anesthesia:   General endotracheal with an interscalene block using Exparel placed preoperatively by the anesthesiologist.  Findings:   As above.  Complications:   None  EBL:   125 cc  Fluids:   700 cc crystalloid  UOP:   None  TT:   None  Drains:   None  Closure:   Staples  Implants:   All press-fit Integra system with a 7 mm stem, a standard metaphyseal body, a +3 mm humeral platform, a mini baseplate, and a 38 mm concentric +2 mm laterally offset glenosphere.  Brief Clinical Note:   The patient is a 70 year old female with a long history of gradually worsening right shoulder pain. Her symptoms have persisted despite medications, activity modification, etc. Her history and examination consistent with a massive irreparable rotator cuff tear with early cuff arthropathy, all of which were confirmed by preoperative MRI scan. The patient presents at this time for a reverse right total shoulder arthroplasty.  Procedure:   The patient underwent placement of an interscalene block using Exparel by the anesthesiologist in the preoperative holding area before being brought into the operating room and lain in the supine position. The patient then underwent general endotracheal intubation and anesthesia before the patient was repositioned in the beach chair position using the beach chair positioner. The right shoulder and upper extremity were prepped with ChloraPrep solution before being draped sterilely. Preoperative antibiotics were administered.   A timeout was performed to verify the appropriate surgical site before a standard anterior approach to the shoulder was made  through an approximately 4-5 inch incision. The incision was carried down through the subcutaneous tissues to expose the deltopectoral fascia. The interval between the deltoid and pectoralis muscles was identified and this plane developed, retracting the cephalic vein laterally with the deltoid muscle. The conjoined tendon was identified. Its lateral margin was dissected and the Kolbel self-retraining retractor inserted. The "three sisters" were identified and cauterized. Bursal tissues were removed to improve visualization. The subscapularis tendon was released from its attachment to the lesser tuberosity 1 cm proximal to its insertion and several tagging sutures placed. The inferior capsule was released with care after identifying and protecting the axillary nerve. The proximal humeral cut was made at approximately 20 of retroversion using the extra-medullary guide.   Attention was redirected to the glenoid. The labrum was debrided circumferentially before the center of the glenoid was identified. The guidewire was drilled into the glenoid neck using the appropriate guide. After verifying its position, it was overreamed with the mini-baseplate reamer to create a flat surface before the stem reamer was utilized. The superior and inferior peg sites were reamed using the appropriate guide to complete the glenoid preparation. The permanent mini-baseplate was impacted into place. It was stabilized with a 15 x 4.5 mm central screw and four peripheral screws. Locking caps were placed over the superior and inferior screws. The permanent 38 mm concentric glenosphere with +2 mm of lateral offset was then impacted into place and its Morse taper locking mechanism verified using manual distraction.  Attention was directed to the humeral side. The humeral canal was prepared utilizing the tapered stem reamers  sequentially beginning with the 6 mm stem and progressing to a 7 mm stem. This demonstrated a good tight fit. The  metaphyseal region was then prepared using the appropriate planar device. The trial stem and standard metaphyseal body were put together on the back table and a trial reduction performed using the +0 mm and +3 mm inserts. With the +3 mm insert, the arm demonstrated excellent range of motion as the hand could be brought across the chest to the opposite shoulder and brought to the top of the patient's head and to the patient's ear. The shoulder appeared stable throughout this range of motion. The joint was dislocated and the trial components removed. The permanent 7 mm stem with the standard body was impacted into place with care taken to maintain the appropriate version. A repeat trial reduction with the +3 mm insert again demonstrated excellent stability with the findings as described above. Therefore, the shoulder was re-dislocated and, after inserting the locking screw to secure the body to the stem, the permanent +3 mm insert impacted into place. After verifying its locking mechanism, the shoulder was relocated using two finger pressure and again placed through a range of motion with the findings as described above.  The wound was copiously irrigated with sterile saline solution using the jet lavage system before a total of 30 cc of 0.5% Sensorcaine with epinephrine was injected into the pericapsular and peri-incisional tissues to help with postoperative analgesia. The subscapularis tendon was reapproximated using #2 FiberWire interrupted sutures. The deltopectoral interval was closed using #0 Vicryl interrupted sutures before the subcutaneous tissues were closed using 2-0 Vicryl interrupted sutures. The skin was closed using staples. Prior to closing the skin, 1 g of transexemic acid in 10 cc of normal saline was injected intra-articularly to help with postoperative bleeding. A sterile occlusive dressing was applied to the wound before the arm was placed into a shoulder immobilizer with an abduction pillow. A  Polar Care system also was applied to the shoulder. The patient was then transferred back to a hospital bed before being awakened, extubated, and returned to the recovery room in satisfactory condition after tolerating the procedure well.

## 2020-11-27 NOTE — Anesthesia Preprocedure Evaluation (Signed)
Anesthesia Evaluation  Patient identified by MRN, date of birth, ID band Patient awake    Reviewed: Allergy & Precautions, H&P , NPO status , Patient's Chart, lab work & pertinent test results, reviewed documented beta blocker date and time   History of Anesthesia Complications (+) PONV and history of anesthetic complications  Airway Mallampati: II  TM Distance: >3 FB Neck ROM: full    Dental  (+) Teeth Intact   Pulmonary neg pulmonary ROS, asthma ,    Pulmonary exam normal        Cardiovascular Exercise Tolerance: Good hypertension, On Medications Normal cardiovascular exam+ dysrhythmias  Rhythm:regular Rate:Normal     Neuro/Psych negative neurological ROS  negative psych ROS   GI/Hepatic Neg liver ROS, hiatal hernia, GERD  Medicated,  Endo/Other  negative endocrine ROS  Renal/GU Renal disease  negative genitourinary   Musculoskeletal   Abdominal   Peds  Hematology  (+) Blood dyscrasia, anemia ,   Anesthesia Other Findings Past Medical History: No date: Anemia No date: Arthritis No date: Asthma     Comment:  H/O YEARS AGO-NOW HAS COUGHING SPELLS WHICH IS WHY SHE               HAS HER ALBUTEROL INHALER No date: Basal cell carcinoma     Comment:  R nose (MOHS) 01/29/2016: Basal cell carcinoma     Comment:  Right forehead above med brow 10/12/2019: Basal cell carcinoma     Comment:  L nasal tip - MOHS 05/15/2020 08/12/2017: BRCA gene mutation negative     Comment:  Variant of unknown significance at [facility] only abnormality. 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                MammaPrint: High risk. No date: Cancer [facility])     Comment:  [phone] basal cell carcinoma No date: Complication of anesthesia     Comment:  HARD TIME WAKING UP 02/21/2010: Dysplastic nevus     Comment:   Right mid pretibial, mild No date: Dysrhythmia     Comment:  TACHYCARDIA No date: GERD (gastroesophageal reflux disease) No date: History of hiatal hernia     Comment:  SMALL No date: Hyperlipidemia No date: Hypertension     Comment:  PT STATES IN 01-18-18 PREOP PHONE INTERVIEW THAT HER BP               HAS BEEN ELEVATED SINCE STARTING ON VALSARTAN- PCP               SWITCHED PT BACK TO MAXIDE ON 01-18-18 AND PT STATES THAT              SHE HAS LOST 6-10 POUNDS OF FLUID SINCE RESTARTING               MAXIDE.   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 08/2017: BREAST BIOPSY; Right     Comment:  MRI biopsy, FIBROCYSTIC CHANGE AND USUAL DUCTAL  HYPERPLASIA,, clip placed x3: BREAST CYST EXCISION No date: BREAST EXCISIONAL BIOPSY; Bilateral 01/27/2018: BREAST RECONSTRUCTION WITH PLACEMENT OF TISSUE EXPANDER  AND FLEX HD (ACELLULAR HYDRATED DERMIS); Left     Comment:  Procedure: BREAST RECONSTRUCTION WITH PLACEMENT OF               TISSUE EXPANDER AND FLEX HD (ACELLULAR HYDRATED DERMIS);               Surgeon: Wallace Going, DO;  Location: [facility] ORS;                Service: Plastics;  Laterality: Left; 02/13/14: BREAST SURGERY; Left     Comment:  Fibrocystic changes, pseudo-angiomatous stromal               hyperplasia. No atypia or malignancy. x2: CARPAL TUNNEL RELEASE; Bilateral 2008: CHOLECYSTECTOMY 01/12/2015: COLONOSCOPY WITH PROPOFOL; N/A     Comment:  Procedure: COLONOSCOPY WITH PROPOFOL;  Surgeon: Manya Silvas, MD;  Location: [facility] ENDOSCOPY;  Service:               Endoscopy;  Laterality: N/A; 02/06/2020:  COLONOSCOPY WITH PROPOFOL; N/A     Comment:  Procedure: COLONOSCOPY WITH PROPOFOL;  Surgeon:               Lesly Rubenstein, MD;  Location: [facility] ENDOSCOPY;                Service: Endoscopy;  Laterality: N/A; 01/12/2015: ESOPHAGOGASTRODUODENOSCOPY (EGD) WITH PROPOFOL; N/A     Comment:  Procedure: ESOPHAGOGASTRODUODENOSCOPY (EGD) WITH               PROPOFOL;  Surgeon: Manya Silvas, MD;  Location: [facility]              ENDOSCOPY;  Service: Endoscopy;  Laterality: N/A; 02/06/2020: ESOPHAGOGASTRODUODENOSCOPY (EGD) WITH PROPOFOL; N/A     Comment:  Procedure: ESOPHAGOGASTRODUODENOSCOPY (EGD) WITH               PROPOFOL;  Surgeon: Lesly Rubenstein, MD;  Location:               [facility] ENDOSCOPY;  Service: Endoscopy;  Laterality: N/A; 12/23/2018: LIPOSUCTION WITH LIPOFILLING; Left     Comment:  Procedure: LIPOSUCTION WITH LIPOFILLING FROM ABDOMEN TO               LEFT BREAST;  Surgeon: Wallace Going, DO;                Location: [place];  Service: Plastics;               Laterality: Left;  90 min, please 2019: MASTECTOMY; Left     Comment:  [facility] 01/27/2018: MASTECTOMY W/ SENTINEL NODE BIOPSY; Left     Comment:  ypT1c ypN0; ER 90%, PR 20%, HER-2/neu not overexpressed.              MASTECTOMY WITH SENTINEL LYMPH NODE BIOPSY;  Surgeon:               Robert Bellow, MD;  Location: [facility] ORS;  Service:               General;  Laterality: Left; 09/02/2017: MINOR BREAST BIOPSY; Right     Comment:  Radiology perform procedure, fibrocystic changes right               retroareolar area. 1983: NASAL RECONSTRUCTION No  date: OOPHORECTOMY No date: PORT-A-CATH REMOVAL 10/21/2017: PORTACATH PLACEMENT; N/A     Comment:  Procedure: INSERTION PORT-A-CATH;  Surgeon: Robert Bellow, MD;  Location: [facility] ORS;  Service: General;                Laterality: N/A; 05/13/2018: REMOVAL OF TISSUE EXPANDER AND PLACEMENT OF IMPLANT; Left     Comment:  Procedure: REMOVAL OF  TISSUE EXPANDER AND PLACEMENT OF               IMPLANT;  Surgeon: Wallace Going, DO;  Location:               [facility] ORS;  Service: Plastics;  Laterality: Left;  total               surgery time should be 3 hours, per provider 01/12/2015: SAVORY DILATION; N/A     Comment:  Procedure: SAVORY DILATION;  Surgeon: Manya Silvas,               MD;  Location: [facility] ENDOSCOPY;  Service: Endoscopy;                Laterality: N/A; 1956: TONSILLECTOMY AND ADENOIDECTOMY BMI    Body Mass Index: 25.91 kg/m     Reproductive/Obstetrics negative OB ROS                             Anesthesia Physical Anesthesia Plan  ASA: 2  Anesthesia Plan: General ETT   Post-op Pain Management:  Regional for Post-op pain   Induction:   PONV Risk Score and Plan:   Airway Management Planned:   Additional Equipment:   Intra-op Plan:   Post-operative Plan:   Informed Consent: I have reviewed the patients History and Physical, chart, labs and discussed the procedure including the risks, benefits and alternatives for the proposed anesthesia with the patient or authorized representative who has indicated his/her understanding and acceptance.     Dental Advisory Given  Plan Discussed with: CRNA  Anesthesia Plan Comments:         Anesthesia Quick Evaluation

## 2020-11-27 NOTE — Anesthesia Procedure Notes (Signed)
Procedure Name: Intubation Date/Time: 11/27/2020 7:36 AM Performed by: Aline Brochure, CRNA Pre-anesthesia Checklist: Patient identified, Patient being monitored, Timeout performed, Emergency Drugs available and Suction available Patient Re-evaluated:Patient Re-evaluated prior to induction Oxygen Delivery Method: Circle system utilized Preoxygenation: Pre-oxygenation with 100% oxygen Induction Type: IV induction Ventilation: Mask ventilation without difficulty Laryngoscope Size: 3 and McGraph Grade View: Grade I Tube type: Oral Tube size: 7.0 mm Number of attempts: 1 Airway Equipment and Method: Stylet and Video-laryngoscopy Placement Confirmation: ETT inserted through vocal cords under direct vision, positive ETCO2 and breath sounds checked- equal and bilateral Secured at: 21 cm Tube secured with: Tape Dental Injury: Teeth and Oropharynx as per pre-operative assessment  Difficulty Due To: Difficulty was anticipated and Difficult Airway- due to anterior larynx

## 2020-11-27 NOTE — Transfer of Care (Signed)
Immediate Anesthesia Transfer of Care Note  Patient: Mariah Hogan  Procedure(s) Performed: REVERSE SHOULDER ARTHROPLASTY (Right: Shoulder)  Patient Location: PACU  Anesthesia Type:General  Level of Consciousness: awake  Airway & Oxygen Therapy: Patient Spontanous Breathing and Patient connected to face mask oxygen  Post-op Assessment: Report given to RN and Post -op Vital signs reviewed and stable  Post vital signs: Reviewed and stable  Last Vitals:  Vitals Value Taken Time  BP 185/72 11/27/20 1000  Temp    Pulse 84 11/27/20 1001  Resp 15 11/27/20 1001  SpO2 94 % 11/27/20 1001  Vitals shown include unvalidated device data.  Last Pain:  Vitals:   11/27/20 2863  TempSrc: Oral  PainSc: 4          Complications: No notable events documented.

## 2020-11-28 ENCOUNTER — Observation Stay: Payer: Medicare Other

## 2020-11-28 ENCOUNTER — Encounter: Payer: Self-pay | Admitting: Surgery

## 2020-11-28 DIAGNOSIS — Z86010 Personal history of colonic polyps: Secondary | ICD-10-CM | POA: Diagnosis not present

## 2020-11-28 DIAGNOSIS — M25511 Pain in right shoulder: Secondary | ICD-10-CM | POA: Diagnosis present

## 2020-11-28 DIAGNOSIS — Z9049 Acquired absence of other specified parts of digestive tract: Secondary | ICD-10-CM | POA: Diagnosis not present

## 2020-11-28 DIAGNOSIS — T380X5A Adverse effect of glucocorticoids and synthetic analogues, initial encounter: Secondary | ICD-10-CM | POA: Diagnosis present

## 2020-11-28 DIAGNOSIS — M3509 Sicca syndrome with other organ involvement: Secondary | ICD-10-CM | POA: Diagnosis present

## 2020-11-28 DIAGNOSIS — Z8709 Personal history of other diseases of the respiratory system: Secondary | ICD-10-CM | POA: Diagnosis not present

## 2020-11-28 DIAGNOSIS — Z8261 Family history of arthritis: Secondary | ICD-10-CM | POA: Diagnosis not present

## 2020-11-28 DIAGNOSIS — Z8249 Family history of ischemic heart disease and other diseases of the circulatory system: Secondary | ICD-10-CM | POA: Diagnosis not present

## 2020-11-28 DIAGNOSIS — J690 Pneumonitis due to inhalation of food and vomit: Secondary | ICD-10-CM | POA: Diagnosis present

## 2020-11-28 DIAGNOSIS — Z8701 Personal history of pneumonia (recurrent): Secondary | ICD-10-CM | POA: Diagnosis not present

## 2020-11-28 DIAGNOSIS — K219 Gastro-esophageal reflux disease without esophagitis: Secondary | ICD-10-CM | POA: Diagnosis present

## 2020-11-28 DIAGNOSIS — I119 Hypertensive heart disease without heart failure: Secondary | ICD-10-CM | POA: Diagnosis present

## 2020-11-28 DIAGNOSIS — Z85828 Personal history of other malignant neoplasm of skin: Secondary | ICD-10-CM | POA: Diagnosis not present

## 2020-11-28 DIAGNOSIS — Z9012 Acquired absence of left breast and nipple: Secondary | ICD-10-CM | POA: Diagnosis not present

## 2020-11-28 DIAGNOSIS — T502X5A Adverse effect of carbonic-anhydrase inhibitors, benzothiadiazides and other diuretics, initial encounter: Secondary | ICD-10-CM | POA: Diagnosis present

## 2020-11-28 DIAGNOSIS — G5600 Carpal tunnel syndrome, unspecified upper limb: Secondary | ICD-10-CM | POA: Diagnosis present

## 2020-11-28 DIAGNOSIS — K449 Diaphragmatic hernia without obstruction or gangrene: Secondary | ICD-10-CM | POA: Diagnosis present

## 2020-11-28 DIAGNOSIS — Z90710 Acquired absence of both cervix and uterus: Secondary | ICD-10-CM | POA: Diagnosis not present

## 2020-11-28 DIAGNOSIS — K529 Noninfective gastroenteritis and colitis, unspecified: Secondary | ICD-10-CM | POA: Diagnosis present

## 2020-11-28 DIAGNOSIS — M19011 Primary osteoarthritis, right shoulder: Secondary | ICD-10-CM | POA: Diagnosis present

## 2020-11-28 DIAGNOSIS — Z853 Personal history of malignant neoplasm of breast: Secondary | ICD-10-CM | POA: Diagnosis not present

## 2020-11-28 DIAGNOSIS — Z9221 Personal history of antineoplastic chemotherapy: Secondary | ICD-10-CM | POA: Diagnosis not present

## 2020-11-28 DIAGNOSIS — M75121 Complete rotator cuff tear or rupture of right shoulder, not specified as traumatic: Secondary | ICD-10-CM | POA: Diagnosis present

## 2020-11-28 DIAGNOSIS — Z20822 Contact with and (suspected) exposure to covid-19: Secondary | ICD-10-CM | POA: Diagnosis present

## 2020-11-28 DIAGNOSIS — K7581 Nonalcoholic steatohepatitis (NASH): Secondary | ICD-10-CM | POA: Diagnosis present

## 2020-11-28 LAB — BASIC METABOLIC PANEL
Anion gap: 6 (ref 5–15)
BUN: 20 mg/dL (ref 8–23)
CO2: 25 mmol/L (ref 22–32)
Calcium: 8.6 mg/dL — ABNORMAL LOW (ref 8.9–10.3)
Chloride: 105 mmol/L (ref 98–111)
Creatinine, Ser: 0.8 mg/dL (ref 0.44–1.00)
GFR, Estimated: >60 mL/min (ref >60)
Glucose, Bld: 154 mg/dL — ABNORMAL HIGH (ref 70–99)
Potassium: 4.1 mmol/L (ref 3.5–5.1)
Sodium: 136 mmol/L (ref 135–145)

## 2020-11-28 LAB — CBC
HCT: 36.9 % (ref 36.0–46.0)
Hemoglobin: 12.3 g/dL (ref 12.0–15.0)
MCH: 30.4 pg (ref 26.0–34.0)
MCHC: 33.3 g/dL (ref 30.0–36.0)
MCV: 91.3 fL (ref 80.0–100.0)
Platelets: 184 10*3/uL (ref 150–400)
RBC: 4.04 MIL/uL (ref 3.87–5.11)
RDW: 12.2 % (ref 11.5–15.5)
WBC: 11.2 10*3/uL — ABNORMAL HIGH (ref 4.0–10.5)
nRBC: 0 % (ref 0.0–0.2)

## 2020-11-28 LAB — SURGICAL PATHOLOGY

## 2020-11-28 MED ORDER — ALBUTEROL SULFATE (2.5 MG/3ML) 0.083% IN NEBU
2.5000 mg | INHALATION_SOLUTION | Freq: Four times a day (QID) | RESPIRATORY_TRACT | Status: DC | PRN
  Administered 2020-11-29 – 2020-12-03 (×10): 2.5 mg via RESPIRATORY_TRACT
  Filled 2020-11-28 (×11): qty 3

## 2020-11-28 MED ORDER — ALBUTEROL SULFATE HFA 108 (90 BASE) MCG/ACT IN AERS
1.0000 | INHALATION_SPRAY | Freq: Four times a day (QID) | RESPIRATORY_TRACT | Status: DC | PRN
  Filled 2020-11-28: qty 6.7

## 2020-11-28 MED ORDER — ATENOLOL 25 MG PO TABS
25.0000 mg | ORAL_TABLET | Freq: Once | ORAL | Status: AC
  Administered 2020-11-28: 25 mg via ORAL
  Filled 2020-11-28: qty 1

## 2020-11-28 NOTE — Plan of Care (Signed)
  Problem: Education: Goal: Knowledge of General Education information will improve Description: Including pain rating scale, medication(s)/side effects and non-pharmacologic comfort measures Outcome: Progressing   Problem: Clinical Measurements: Goal: Will remain free from infection Outcome: Progressing Goal: Respiratory complications will improve Outcome: Progressing   Problem: Activity: Goal: Risk for activity intolerance will decrease Outcome: Progressing   Problem: Nutrition: Goal: Adequate nutrition will be maintained Outcome: Progressing

## 2020-11-28 NOTE — Progress Notes (Addendum)
Pt complained of shortness of breath. Reported that she experience it every time she lies down after using the bathroom. Productive Cough noted. Bp 178/86. Dr. Roland Rack notified. Order for CXR received.  Will continue to monitor pt.

## 2020-11-28 NOTE — Progress Notes (Signed)
Subjective: 1 Day Post-Op Procedure(s) (LRB): REVERSE SHOULDER ARTHROPLASTY (Right) Patient reports pain as mild.   Patient is  well but does report some shortness of breath when laying down and hypertension. PT and Care management to assist with discharge planning. Negative for chest pain and shortness of breath Fever: no Gastrointestinal:Negative for nausea and vomiting   Objective: Vital signs in last 24 hours: Temp:  [97.6 F (36.4 C)-98.4 F (36.9 C)] 97.7 F (36.5 C) (09/21 0823) Pulse Rate:  [63-89] 63 (09/21 0823) Resp:  [18-20] 18 (09/21 0823) BP: (141-186)/(67-95) 180/83 (09/21 0823) SpO2:  [94 %-97 %] 97 % (09/21 0823)  Intake/Output from previous day: No intake or output data in the 24 hours ending 11/28/20 1231  Intake/Output this shift: No intake/output data recorded.  Labs: Recent Labs    11/28/20 0342  HGB 12.3   Recent Labs    11/28/20 0342  WBC 11.2*  RBC 4.04  HCT 36.9  PLT 184   Recent Labs    11/28/20 0342  NA 136  K 4.1  CL 105  CO2 25  BUN 20  CREATININE 0.80  GLUCOSE 154*  CALCIUM 8.6*   No results for input(s): LABPT, INR in the last 72 hours.  EXAM General - Patient is Alert, Appropriate, and Oriented Lungs: Mild wheeze with auscultation. Extremity - Neurologically intact Incision: dressing C/D/I No cellulitis present Compartment soft Decreased sensation to light touch to the right arm from recent nerve block. Dressing/Incision - clean, dry, no drainage Motor Function - intact, moving foot and toes well on exam.  Abdomen soft with normal bowel sounds intact.  Past Medical History:  Diagnosis Date   Anemia    Arthritis    Asthma    H/O YEARS AGO-NOW HAS COUGHING SPELLS WHICH IS WHY SHE HAS HER ALBUTEROL INHALER   Basal cell carcinoma    R nose (MOHS)   Basal cell carcinoma 01/29/2016   Right forehead above med brow   Basal cell carcinoma 10/12/2019   L nasal tip - MOHS 05/15/2020   BRCA gene mutation negative  08/12/2017   Variant of unknown significance at [facility] only abnormality.   Breast cancer ([place]) 08/04/2017   1.8 cm ER 90%, PR 20%, HER-2/neu negative invasive mammary carcinoma of the left upper outer quadrant.  MammaPrint: High risk.   Cancer ([place])    [phone] basal cell carcinoma   Complication of anesthesia    HARD TIME WAKING UP   Dysplastic nevus 02/21/2010   Right mid pretibial, mild   Dysrhythmia    TACHYCARDIA   GERD (gastroesophageal reflux disease)    History of hiatal hernia    SMALL   Hyperlipidemia    Hypertension    PT STATES IN 01-18-18 PREOP PHONE INTERVIEW THAT HER BP HAS BEEN ELEVATED SINCE STARTING ON VALSARTAN- PCP SWITCHED PT BACK TO [place] ON 01-18-18 AND PT STATES THAT SHE HAS LOST 6-10 POUNDS OF FLUID SINCE RESTARTING MAXIDE.     Personal history of chemotherapy    PONV (postoperative nausea and vomiting)    Sjogren's syndrome ([place]) 07/2020   Assessment/Plan: 1 Day Post-Op Procedure(s) (LRB): REVERSE SHOULDER ARTHROPLASTY (Right) Active Problems:   Status post reverse arthroplasty of shoulder, right  Estimated body mass index is 25.91 kg/m as calculated from the following:   Height as of this encounter: 5' 6.5" (1.689 m).   Weight as of this encounter: 73.9 kg. Advance diet Up with therapy D/C IV fluids when tolerating po intake.  Labs reviewed this AM.  WBC 11.2 this AM. Patient with elevated BP, started on Hydralazine. Patient with shortness of breath and productive cough.  May be due to irritation from intubation or possibly phrenic nerve irritation from nerve block.  CXR negative for acute findings, patient with history of pneumonia and also underwent CT of chest in June of this year for similar symptoms.  Patient started on albuterol inhaler as needed. CBC and BMP ordered for tomorrow. Plan will be for d/c to SNF when insurance approved.  DVT Prophylaxis - Lovenox and Foot Pumps Non-weightbearing to the right arm.  Raquel Nathanyal Ashmead,  PA-C [facility] 11/28/2020, 12:31 PM

## 2020-11-28 NOTE — Progress Notes (Signed)
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  PMH includes: HTN, anemia, skin CA, and breast CA.    PT Comments    Pt was pleasant and motivated to participate during the session and put forth good effort throughout. Pt's BP in supine was 162/72, sitting at EOB 183/102, and sitting with legs elevated in recliner at end of session 162/65, nursing notified. Of note pt's BP taken in lower leg secondary to no appropriate UE to use.  Pt required extra time and effort with bed mobility tasks and min A with transfers with ambulation limited to getting from bed to chair secondary to elevated BP at EOB.  Pt's ambulation continued to be effortful with tremulous BLEs and mild instability that the pt was able to self-correct without assist. Pt will benefit from PT services in a SNF setting upon discharge to safely address deficits listed in patient problem list for decreased caregiver assistance and eventual return to PLOF.     Recommendations for follow up therapy are one component of a multi-disciplinary discharge planning process, led by the attending physician.  Recommendations may be updated based on patient status, additional functional criteria and insurance authorization.  Follow Up Recommendations  SNF;Supervision for mobility/OOB     Equipment Recommendations  Other (comment) (TBD at next venue of care)    Recommendations for Other Services       Precautions / Restrictions Precautions Precautions: Fall;Shoulder Shoulder Interventions: Shoulder sling/immobilizer Restrictions RUE Weight Bearing: Non weight bearing     Mobility  Bed Mobility Overal bed mobility: Needs Assistance       Supine to sit: Supervision      General bed mobility comments: Extra time and effort with bed mobility tasks but no physical assistance needed    Transfers Overall transfer level: Needs assistance Equipment used: Straight cane Transfers: Sit to/from Stand Sit to Stand: From elevated surface;Min assist         General transfer comment: Min A needed to come to full upright standing position with cues for sequencing  Ambulation/Gait Ambulation/Gait assistance: Min guard Gait Distance (Feet): 3 Feet Assistive device: Straight cane Gait Pattern/deviations: Step-through pattern;Decreased step length - right;Decreased step length - left Gait velocity: decreased   General Gait Details: Ambulation limited to bed to chair this session secondary to increased BP in dependent position, nsg notified; Pt continued to present with tremulous BLEs and general weakness in standing   Stairs             Wheelchair Mobility    Modified Rankin (Stroke Patients Only)       Balance Overall balance assessment: Needs assistance   Sitting balance-Leahy Scale: Good     Standing balance support: Single extremity supported;During functional activity Standing balance-Leahy Scale: Fair                              Cognition Arousal/Alertness: Awake/alert Behavior During Therapy: WFL for tasks assessed/performed Overall Cognitive Status: Within Functional Limits for tasks assessed  Exercises Total Joint Exercises Ankle Circles/Pumps: AROM;Strengthening;Both;10 reps Quad Sets: 10 reps;Both;Strengthening Gluteal Sets: Strengthening;Both;10 reps Hip ABduction/ADduction: AROM;Strengthening;Both;5 reps Long Arc Quad: AROM;Strengthening;Both;10 reps Marching in Standing: AROM;Strengthening;Both;5 reps;Standing    General Comments        Pertinent Vitals/Pain Pain Assessment: No/denies pain    Home Living                      Prior  Function            PT Goals (current goals can now be found in the care plan section) Progress towards PT goals: PT to reassess next treatment    Frequency    BID      PT Plan Current plan remains appropriate    Co-evaluation              AM-PAC PT "6 Clicks" Mobility   Outcome Measure  Help needed turning from your back to your side while in a flat bed without using bedrails?: A Little Help needed moving from lying on your back to sitting on the side of a flat bed without using bedrails?: A Little Help needed moving to and from a bed to a chair (including a wheelchair)?: A Little Help needed standing up from a chair using your arms (e.g., wheelchair or bedside chair)?: A Little Help needed to walk in hospital room?: A Little Help needed climbing 3-5 steps with a railing? : A Lot 6 Click Score: 17    End of Session Equipment Utilized During Treatment: Gait belt Activity Tolerance: Other (comment) (Increased BP in sitting) Patient left: in chair;with call bell/phone within reach;with chair alarm set;Other (comment);with SCD's reapplied (Polar care donned to RUE) Nurse Communication: Mobility status;Other (comment) (Pt's BP in sitting/supine) PT Visit Diagnosis: Unsteadiness on feet (R26.81);Difficulty in walking, not elsewhere classified (R26.2);Muscle weakness (generalized) (M62.81)     Time: 7588-3254 PT Time Calculation (min) (ACUTE ONLY): 27 min  Charges:  $Therapeutic Exercise: 8-22 mins $Therapeutic Activity: 8-22 mins                     D. Scott Kiran Lapine PT, DPT 11/28/20, 1:34 PM

## 2020-11-28 NOTE — Plan of Care (Signed)
  Problem: Education: Goal: Knowledge of General Education information will improve Description: Including pain rating scale, medication(s)/side effects and non-pharmacologic comfort measures 11/28/2020 0846 by Trula Slade, RN Outcome: Progressing 11/28/2020 0822 by Trula Slade, RN Outcome: Progressing   Problem: Health Behavior/Discharge Planning: Goal: Ability to manage health-related needs will improve 11/28/2020 0846 by Trula Slade, RN Outcome: Progressing 11/28/2020 0822 by Trula Slade, RN Outcome: Progressing   Problem: Clinical Measurements: Goal: Ability to maintain clinical measurements within normal limits will improve 11/28/2020 0846 by Trula Slade, RN Outcome: Progressing 11/28/2020 0822 by Trula Slade, RN Outcome: Progressing Goal: Will remain free from infection 11/28/2020 0846 by Trula Slade, RN Outcome: Progressing 11/28/2020 0822 by Trula Slade, RN Outcome: Progressing Goal: Diagnostic test results will improve 11/28/2020 0846 by Trula Slade, RN Outcome: Progressing 11/28/2020 0822 by Trula Slade, RN Outcome: Progressing Goal: Respiratory complications will improve 11/28/2020 0846 by Trula Slade, RN Outcome: Progressing 11/28/2020 0822 by Trula Slade, RN Outcome: Progressing Goal: Cardiovascular complication will be avoided 11/28/2020 0846 by Trula Slade, RN Outcome: Progressing 11/28/2020 0822 by Trula Slade, RN Outcome: Progressing   Problem: Activity: Goal: Risk for activity intolerance will decrease 11/28/2020 0846 by Trula Slade, RN Outcome: Progressing 11/28/2020 0822 by Trula Slade, RN Outcome: Progressing   Problem: Nutrition: Goal: Adequate nutrition will be maintained 11/28/2020 0846 by Trula Slade, RN Outcome: Progressing 11/28/2020 0822 by Trula Slade, RN Outcome: Progressing   Problem: Coping: Goal: Level of anxiety will decrease 11/28/2020  0846 by Trula Slade, RN Outcome: Progressing 11/28/2020 0822 by Trula Slade, RN Outcome: Progressing   Problem: Elimination: Goal: Will not experience complications related to bowel motility 11/28/2020 0846 by Trula Slade, RN Outcome: Progressing 11/28/2020 0822 by Trula Slade, RN Outcome: Progressing Goal: Will not experience complications related to urinary retention 11/28/2020 0846 by Trula Slade, RN Outcome: Progressing 11/28/2020 0822 by Trula Slade, RN Outcome: Progressing   Problem: Pain Managment: Goal: General experience of comfort will improve 11/28/2020 0846 by Trula Slade, RN Outcome: Progressing 11/28/2020 0822 by Trula Slade, RN Outcome: Progressing   Problem: Safety: Goal: Ability to remain free from injury will improve 11/28/2020 0846 by Trula Slade, RN Outcome: Progressing 11/28/2020 0822 by Trula Slade, RN Outcome: Progressing   Problem: Skin Integrity: Goal: Risk for impaired skin integrity will decrease 11/28/2020 0846 by Trula Slade, RN Outcome: Progressing 11/28/2020 0822 by Trula Slade, RN Outcome: Progressing

## 2020-11-28 NOTE — NC FL2 (Signed)
[place] LEVEL OF CARE SCREENING TOOL     IDENTIFICATION  Patient Name: Mariah Hogan Birthdate: Aug 29, 1950 Sex: female Admission Date (Current Location): 11/27/2020  [facility] [place] Number:  Engineering geologist and Address:  [facility], [address], [place], [place] [address]      Provider Number: [id number]  Attending Physician Name and Address:  Corky Mull, MD  Relative Name and Phone Number:  Hedy Camara [phone]    Current Level of Care: Hospital Recommended Level of Care: [place] Prior Approval Number:    Date Approved/Denied:   PASRR Number: [id number]  Discharge Plan: SNF    Current Diagnoses: Patient Active Problem List   Diagnosis Date Noted   Status post reverse arthroplasty of shoulder, right 11/27/2020   Carcinoma of overlapping sites of left breast in female, estrogen receptor positive ([place]) 07/30/2020   Personal history of other malignant neoplasm of skin 11/17/2019   Complex tear of medial meniscus of left knee as current injury 08/10/2019   Primary osteoarthritis of left knee 08/10/2019   S/P breast reconstruction, left 04/15/2019   Acquired absence of left breast 12/23/2018   Breast asymmetry following reconstructive surgery 08/31/2018   Acquired absence of breast 02/09/2018   S/P mastectomy, left 02/02/2018   Breast cancer ([place]) 01/27/2018   Thrush 11/05/2017   Goals of care, counseling/discussion 10/09/2017   Hypokalemia 10/09/2017   Acute renal failure (ARF) ([place]) 09/11/2017   Diarrhea 09/11/2017   Encounter for antineoplastic chemotherapy 09/03/2017   Nodule of upper lobe of right lung 08/24/2017   Osteopenia 08/24/2017   Malignant neoplasm of left female breast ([place]) 08/07/2017   Invasive ductal carcinoma of left breast ([place]) 08/05/2017   Incomplete emptying of bladder 09/03/2016   SUI (stress urinary incontinence, female) 09/03/2016   Mass of right breast  02/14/2014   Mass of upper outer quadrant of left breast 01/20/2014   Arthritis 12/28/2013   Asthma without status asthmaticus 12/28/2013   Esophageal reflux 12/28/2013   Hyperlipidemia 12/28/2013   Hypertension 05/09/2003    Orientation RESPIRATION BLADDER Height & Weight     Self, Time, Situation, Place  Normal Continent, External catheter Weight: 73.9 kg Height:  5' 6.5" (168.9 cm)  BEHAVIORAL SYMPTOMS/MOOD NEUROLOGICAL BOWEL NUTRITION STATUS      Continent Diet (regular)  AMBULATORY STATUS COMMUNICATION OF NEEDS Skin   Extensive Assist Verbally Normal, Surgical wounds                       Personal Care Assistance Level of Assistance  Bathing, Dressing Bathing Assistance: Limited assistance   Dressing Assistance: Limited assistance     Functional Limitations Info             SPECIAL CARE FACTORS FREQUENCY  PT (By licensed PT), OT (By licensed OT)     PT Frequency: 5 times per week OT Frequency: 5 times per week            Contractures Contractures Info: Not present    Additional Factors Info  Code Status, Allergies Code Status Info: full code Allergies Info: Cytoxan (Cyclophosphamide), Penicillin G, Taxotere (Docetaxel), Other, Parabens, Shellac, Ciprofloxacin, Fluocinonide, Tape, Triamcinolone           Current Medications (11/28/2020):  This is the current hospital active medication list Current Facility-Administered Medications  Medication Dose Route Frequency Provider Last Rate Last Admin   0.9 %  sodium chloride infusion   Intravenous Continuous Poggi, Jenny Reichmann  J, MD 75 mL/hr at 11/28/20 0554 New Bag at 11/28/20 0554   acetaminophen (TYLENOL) tablet 325-650 mg  325-650 mg Oral Q6H PRN Corky Mull, MD   500 mg at 11/27/20 1515   acetaminophen (TYLENOL) tablet 500 mg  500 mg Oral Q6H Poggi, Marshall Cork, MD   500 mg at 11/28/20 0524   albuterol (PROVENTIL) (2.5 MG/3ML) 0.083% nebulizer solution 2.5 mg  2.5 mg Nebulization Q6H PRN Noralee Space, RPH        atenolol (TENORMIN) tablet 50 mg  50 mg Oral BH-q7a Poggi, Marshall Cork, MD   50 mg at 11/28/20 0555   bisacodyl (DULCOLAX) suppository 10 mg  10 mg Rectal Daily PRN Poggi, Marshall Cork, MD       calcium carbonate (OS-CAL - dosed in mg of elemental calcium) tablet 500 mg of elemental calcium  500 mg of elemental calcium Oral Q breakfast Poggi, Marshall Cork, MD   500 mg of elemental calcium at 11/28/20 0836   cholecalciferol (VITAMIN D3) tablet 2,000 Units  2,000 Units Oral Daily Poggi, Marshall Cork, MD   2,000 Units at 11/28/20 0835   citalopram (CELEXA) tablet 20 mg  20 mg Oral QHS Poggi, Marshall Cork, MD   20 mg at 11/27/20 2140   diphenhydrAMINE (BENADRYL) 12.5 MG/5ML elixir 12.5-25 mg  12.5-25 mg Oral Q4H PRN Poggi, Marshall Cork, MD       docusate sodium (COLACE) capsule 100 mg  100 mg Oral BID Corky Mull, MD   100 mg at 11/28/20 0835   enoxaparin (LOVENOX) injection 40 mg  40 mg Subcutaneous Q24H Poggi, Marshall Cork, MD   40 mg at 11/28/20 4332   gabapentin (NEURONTIN) capsule 300 mg  300 mg Oral TID Corky Mull, MD   300 mg at 11/28/20 9518   HYDROcodone-acetaminophen (NORCO/VICODIN) 5-325 MG per tablet 1-2 tablet  1-2 tablet Oral Q4H PRN Poggi, Marshall Cork, MD       ketorolac (TORADOL) 15 MG/ML injection 7.5 mg  7.5 mg Intravenous Q6H Poggi, Marshall Cork, MD   7.5 mg at 11/28/20 0524   magnesium hydroxide (MILK OF MAGNESIA) suspension 30 mL  30 mL Oral Daily PRN Poggi, Marshall Cork, MD       metoCLOPramide (REGLAN) tablet 5-10 mg  5-10 mg Oral Q8H PRN Poggi, Marshall Cork, MD       Or   metoCLOPramide (REGLAN) injection 5-10 mg  5-10 mg Intravenous Q8H PRN Poggi, Marshall Cork, MD       morphine 2 MG/ML injection 0.5-1 mg  0.5-1 mg Intravenous Q2H PRN Poggi, Marshall Cork, MD       ondansetron (ZOFRAN) tablet 4 mg  4 mg Oral Q6H PRN Poggi, Marshall Cork, MD       Or   ondansetron (ZOFRAN) injection 4 mg  4 mg Intravenous Q6H PRN Poggi, Marshall Cork, MD       pantoprazole (PROTONIX) EC tablet 40 mg  40 mg Oral BID Corky Mull, MD   40 mg at 11/28/20 0835   sodium  phosphate (FLEET) 7-19 GM/118ML enema 1 enema  1 enema Rectal Once PRN Poggi, Marshall Cork, MD       triamterene-hydrochlorothiazide (MAXZIDE-25) 37.5-25 MG per tablet 0.5 tablet  0.5 tablet Oral Daily Poggi, Marshall Cork, MD   0.5 tablet at 11/28/20 0844   valACYclovir (VALTREX) tablet 1,000 mg  1,000 mg Oral Daily Poggi, Marshall Cork, MD   1,000 mg at 11/28/20 8416     Discharge Medications: Please  see discharge summary for a list of discharge medications.  Relevant Imaging Results:  Relevant Lab Results:   Additional Information SS# [id number], has had 2 covid vaccines and 1 booster, had flu vaccine in Sept 2022  Edem Tiegs Jen Mow, RN

## 2020-11-28 NOTE — TOC Progression Note (Signed)
Transition of Care [facility]) - Progression Note    Patient Details  Name: Mariah Hogan MRN: [id number] Date of Birth: 02-07-1951  Transition of Care [facility]) CM/SW [place], RN Phone Number: 11/28/2020, 11:10 AM  Clinical Narrative:    Met with the patient to discuss DC plan and needs She is agreeable to go to STR SNF she has had 2 civud vaccines and 1 booster, does not want an additional booster, she had the Flu shot a week ago,  PASSR completed, FL2 completed, Bedsearch sent, will review the bed offers once obtained       Expected Discharge Plan and Services                                                 Social Determinants of Health (SDOH) Interventions    Readmission Risk Interventions No flowsheet data found.

## 2020-11-28 NOTE — Progress Notes (Signed)
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  PMH includes: HTN, anemia, skin CA, and breast CA.    PT Comments    Pt was pleasant and motivated to participate during the session and made good progress towards goals.  Pt's SpO2 remained mostly 94-96% during the session with one momentary reading of 93% after amb.  Pt with improved stability and activity tolerance with gait and was able to amb 20 feet without LOB or buckling.  Pt also presented with decreased tremulousness to her LE's while walking and subjectively seemed more confident. Pt will benefit from PT services in a SNF setting upon discharge to safely address deficits listed in patient problem list for decreased caregiver assistance and eventual return to PLOF.      Recommendations for follow up therapy are one component of a multi-disciplinary discharge planning process, led by the attending physician.  Recommendations may be updated based on patient status, additional functional criteria and insurance authorization.  Follow Up Recommendations  SNF;Supervision for mobility/OOB     Equipment Recommendations  Other (comment) (TBD at next venue of care)    Recommendations for Other Services       Precautions / Restrictions Precautions Precautions: Fall;Shoulder Shoulder Interventions: Shoulder sling/immobilizer Restrictions Weight Bearing Restrictions: Yes RUE Weight Bearing: Non weight bearing     Mobility  Bed Mobility Overal bed mobility: Modified Independent Bed Mobility: Supine to Sit;Sit to Supine     Supine to sit: Supervision Sit to supine: Supervision   General bed mobility comments: Extra time and effort with bed mobility tasks but no  physical assistance needed    Transfers Overall transfer level: Needs assistance Equipment used: Straight cane Transfers: Sit to/from Stand Sit to Stand: Min guard;From elevated surface         General transfer comment: Extra time and effort to come to standing but no physical assistance needed this session  Ambulation/Gait Ambulation/Gait assistance: Min guard Gait Distance (Feet): 20 Feet Assistive device: Straight cane Gait Pattern/deviations: Step-through pattern;Decreased step length - right;Decreased step length - left Gait velocity: decreased   General Gait Details: Slow cadence but steady without LOB with decreased BLE tremors this session   Stairs             Wheelchair Mobility    Modified Rankin (Stroke Patients Only)       Balance Overall balance assessment: Needs assistance Sitting-balance support: No upper extremity supported;Feet supported Sitting balance-Leahy Scale: Good Sitting balance - Comments: Steady static/dynamic sitting balance at EOB   Standing balance support: Single extremity supported;During functional activity Standing balance-Leahy Scale: Fair Standing balance comment: Steady with standing/ambulation with min lean on the [facility] for support                            Cognition Arousal/Alertness: Awake/alert Behavior During Therapy: WFL for tasks assessed/performed Overall Cognitive Status: Within Functional Limits for tasks assessed                                        Exercises Total Joint Exercises Ankle Circles/Pumps: Strengthening;Both;10 reps (manual resistance) Quad Sets: 10 reps;Both;Strengthening Gluteal Sets:  Strengthening;Both;10 reps Hip ABduction/ADduction: Strengthening;Both;5 reps (manual resistance) Straight Leg Raises: Strengthening;Both;5 reps Long Arc Quad: AROM;Strengthening;Both;10 reps Marching in Standing: AROM;Strengthening;Both;5 reps;Standing Other Exercises Other  Exercises: OT facilitates pt education on falls prevention strategies, sling management, polar care management, shoulder precautions and compensatory strategies for ADL management in context of NWB RUE and shoulder precautions. OT facilitates UB dressing tasks, standing grooming, and toileting during session. See ADL section for additional details.    General Comments General comments (skin integrity, edema, etc.): Pt becomes SOB after returning to bed at end of session. SpO2 noted to be 91-93% with HR in low 70's. RN in room to assess and aware.      Pertinent Vitals/Pain Pain Assessment: No/denies pain    Home Living Family/patient expects to be discharged to:: Private residence Living Arrangements: Alone Available Help at Discharge: Family;Available PRN/intermittently Type of Home: House Home Access: Stairs to enter Entrance Stairs-Rails: Left Home Layout: One level Home Equipment: None      Prior Function Level of Independence: Independent      Comments: Pt reports she is independent with all  ADL/IADL managment at baseline. She denies falls history and endorses assisting with care for her two young grandchildren. +driving.   PT Goals (current goals can now be found in the care plan section) Acute Rehab PT Goals Patient Stated Goal: To be able to cook and care for myself Progress towards PT goals: Progressing toward goals    Frequency    BID      PT Plan Current plan remains appropriate    Co-evaluation              AM-PAC PT "6 Clicks" Mobility   Outcome Measure  Help needed turning from your back to your side while in a flat bed without using bedrails?: A Little Help needed moving from lying on your back to sitting on the side of a flat bed without using bedrails?: A Little Help needed moving to and from a bed to a chair (including a wheelchair)?: A Little Help needed standing up from a chair using your arms (e.g., wheelchair or bedside chair)?: A  Little Help needed to walk in hospital room?: A Little Help needed climbing 3-5 steps with a railing? : A Lot 6 Click Score: 17    End of Session Equipment Utilized During Treatment: Gait belt Activity Tolerance: Patient tolerated treatment well Patient left: in bed;with bed alarm set;with call bell/phone within reach;with SCD's reapplied;Other (comment) (Polar care donned to RUE, pt declined up in chair) Nurse Communication: Mobility status;Other (comment) (SpO2 >/= 93% throughout the session, mostly 94-96%) PT Visit Diagnosis: Unsteadiness on feet (R26.81);Difficulty in walking, not elsewhere classified (R26.2);Muscle weakness (generalized) (M62.81)     Time: 6153-7943 PT Time Calculation (min) (ACUTE ONLY): 26 min  Charges:  $Gait Training: 8-22 mins $Therapeutic Exercise: 8-22 mins $Therapeutic Activity: 8-22 mins                    D. Scott Athalee Esterline PT, DPT 11/28/20, 3:57 PM

## 2020-11-28 NOTE — Evaluation (Signed)
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  PMH includes: HTN, anemia, skin CA, and breast CA.   Clinical Impression   [name] was seen for an OT evaluation this date. Pt lives alone in a one level home with 4 STE and L hand rail. Prior to surgery, pt was active and independent. She endorses caring for her two young grandsons and completing all housework, yard work, and ADLs independently. Pt has orders for her RUE (dominant) to be immobilized and will be NWBing per MD. Patient presents with impaired strength/ROM, pain, and sensation to RUE with block not completely resolved yet. These impairments result in a decreased ability to perform self care tasks requiring mod assist for UB/LB dressing and bathing and max assist for application of polar care, compression stockings, and sling/immobilizer. Pt instructed in polar care mgt, compression stockings mgt, sling/immobilizer mgt, ROM exercises for RUE (with instructions for no shoulder exercises until full sensation has returned), RUE precautions, adaptive strategies for bathing/dressing/toileting/grooming, and home/routines modifications to maximize falls prevention, safety, and independence. OT adjusted sling/immobilizer and polar care to improve comfort, optimize positioning, and to maximize skin integrity/safety. Pt verbalized understanding of all education/training provided. Pt will benefit from skilled OT services to address these limitations and improve independence in daily tasks. Recommend STR to continue therapy to maximize return to PLOF, address home/routines modifications and safety, minimize falls risk, and minimize caregiver burden.        Recommendations for  follow up therapy are one component of a multi-disciplinary discharge planning process, led by the attending physician.  Recommendations may be updated based on patient status, additional functional criteria and insurance authorization.   Follow Up Recommendations  SNF    Equipment Recommendations  3 in 1 bedside commode    Recommendations for Other Services       Precautions / Restrictions Precautions Precautions: Fall;Shoulder Shoulder Interventions: Shoulder sling/immobilizer Restrictions Weight Bearing Restrictions: Yes RUE Weight Bearing: Non weight bearing      Mobility Bed Mobility Overal bed mobility: Needs Assistance Bed Mobility: Supine to Sit;Sit to Supine     Supine to sit: Supervision Sit to supine: Supervision   General bed mobility comments: Extra time and effort with bed mobility tasks but no physical assistance needed    Transfers Overall transfer level: Needs assistance Equipment used: Straight cane Transfers: Sit to/from Stand Sit to Stand: Min guard;Min assist         General transfer comment: Min A for initial STS attempt from bed at min elevated height. Pt able to progress to close CGA during subsequent STS attempts including from [facility]. Min posterior LOB appreciated during final sit back onto EOB. Pt able to safely return to seated surface.    Balance Overall balance assessment: Needs assistance Sitting-balance support: No upper extremity supported;Feet supported Sitting balance-Leahy Scale: Good Sitting balance - Comments: Steady static/dynamic sitting balance at EOB   Standing balance support: Single extremity supported;During functional activity Standing balance-Leahy Scale: Fair Standing balance comment: Brief instance of instability suring standing to sitting transfer to EOB at end of session. Pt able to safely self-correct with support of bed rails.  ADL either performed or assessed with clinical  judgement   ADL Overall ADL's : Needs assistance/impaired                                       General ADL Comments: Pt is significantly functionally limited by decreased functional use of RUE (dominant), decreased activity tolerance, & generalized weakness. She also becomes SOB upon exertion with bed mobility at end of session. RN in room to assess. SO2 noted to be 91-93% once pt back to bed. Pt requires MAX A to doff/don sling and polar care. MOD A for UB dressing. Close CGA during standing grooming briefly at sink.     Vision Baseline Vision/History: 1 Wears glasses Ability to See in Adequate Light: 1 Impaired Patient Visual Report: No change from baseline       Perception     Praxis      Pertinent Vitals/Pain Pain Assessment: No/denies pain     Hand Dominance Right   Extremity/Trunk Assessment Upper Extremity Assessment Upper Extremity Assessment: RUE deficits/detail RUE Deficits / Details: s/p R rev TSA. Decreased light touch sensation in RUE, pt endorses some sensation return to hand and fingers with "tingling". Pt with orders for RUE to remain immobilized & NWB. RUE: Unable to fully assess due to immobilization   Lower Extremity Assessment Lower Extremity Assessment: Generalized weakness;Defer to PT evaluation   Cervical / Trunk Assessment Cervical / Trunk Assessment: Normal   Communication Communication Communication: No difficulties   Cognition Arousal/Alertness: Awake/alert Behavior During Therapy: WFL for tasks assessed/performed Overall Cognitive Status: Within Functional Limits for tasks assessed                                     General Comments  Pt becomes SOB after returning to bed at end of session. SpO2 noted to be 91-93% with HR in low 70's. RN in room to assess and aware.    Exercises Total Joint Exercises Ankle Circles/Pumps: AROM;Strengthening;Both;10 reps Quad Sets: 10 reps;Both;Strengthening Gluteal Sets:  Strengthening;Both;10 reps Hip ABduction/ADduction: AROM;Strengthening;Both;5 reps Long Arc Quad: AROM;Strengthening;Both;10 reps Marching in Standing: AROM;Strengthening;Both;5 reps;Standing Other Exercises Other Exercises: OT facilitates pt education on falls prevention strategies, sling management, polar care management, shoulder precautions and compensatory strategies for ADL management in context of NWB RUE and shoulder precautions. OT facilitates UB dressing tasks, standing grooming, and toileting during session. See ADL section for additional details.   Shoulder Instructions      Home Living Family/patient expects to be discharged to:: Private residence Living Arrangements: Alone Available Help at Discharge: Family;Available PRN/intermittently Type of Home: House Home Access: Stairs to enter [employer] of Steps: 4 Entrance Stairs-Rails: Left Home Layout: One level     Bathroom Shower/Tub: Teacher, early years/pre: Standard     Home Equipment: None          Prior Functioning/Environment Level of Independence: Independent        Comments: Pt reports she is independent with all  ADL/IADL managment at baseline. She denies falls history and endorses assisting with care for her two young grandchildren. +driving.        OT Problem List: Decreased strength;Decreased range of motion;Impaired balance (sitting and/or standing);Pain;Decreased knowledge of precautions;Decreased safety awareness;Cardiopulmonary status limiting activity;Decreased activity tolerance;Decreased coordination;Decreased knowledge of use of DME or AE  OT Treatment/Interventions: DME and/or AE instruction;Self-care/ADL training;Therapeutic exercise;Therapeutic activities;Neuromuscular education;Energy conservation;Patient/family education;Balance training    OT Goals(Current goals can be found in the care plan section) Acute Rehab OT Goals Patient Stated Goal: To be able to cook  and care for myself OT Goal Formulation: With patient Time For Goal Achievement: 12/12/20 Potential to Achieve Goals: Good ADL Goals Pt Will Perform Grooming: standing;with modified independence Pt Will Perform Upper Body Dressing: sitting;with set-up;with supervision Pt Will Perform Lower Body Dressing: with modified independence;sit to/from stand  OT Frequency: Min 1X/week   Barriers to D/C: Inaccessible home environment;Decreased caregiver support          Co-evaluation              AM-PAC OT "6 Clicks" Daily Activity     Outcome Measure Help from another person eating meals?: A Little Help from another person taking care of personal grooming?: A Little Help from another person toileting, which includes using toliet, bedpan, or urinal?: A Little Help from another person bathing (including washing, rinsing, drying)?: A Lot Help from another person to put on and taking off regular upper body clothing?: A Lot Help from another person to put on and taking off regular lower body clothing?: A Lot 6 Click Score: 15   End of Session Equipment Utilized During Treatment: Gait belt (straight cane) Nurse Communication: Mobility status;Other (comment) (Pt SOB with decreased SO2 at end of session.)  Activity Tolerance: Patient tolerated treatment well Patient left: in bed;with call bell/phone within reach;with bed alarm set  OT Visit Diagnosis: Other abnormalities of gait and mobility (R26.89);Muscle weakness (generalized) (M62.81);Pain Pain - Right/Left: Right Pain - part of body: Shoulder;Arm                Time: 3435-6861 OT Time Calculation (min): 76 min Charges:  OT General Charges $OT Visit: 1 Visit OT Evaluation $OT Eval Moderate Complexity: 1 Mod OT Treatments $Self Care/Home Management : 53-67 mins  Shara Blazing, M.S., OTR/L Ascom: [phone] 11/28/20, 3:55 PM

## 2020-11-28 NOTE — Plan of Care (Signed)

## 2020-11-29 LAB — CBC
HCT: 37.8 % (ref 36.0–46.0)
Hemoglobin: 12.4 g/dL (ref 12.0–15.0)
MCH: 30.6 pg (ref 26.0–34.0)
MCHC: 32.8 g/dL (ref 30.0–36.0)
MCV: 93.3 fL (ref 80.0–100.0)
Platelets: 176 10*3/uL (ref 150–400)
RBC: 4.05 MIL/uL (ref 3.87–5.11)
RDW: 12.5 % (ref 11.5–15.5)
WBC: 9.9 10*3/uL (ref 4.0–10.5)
nRBC: 0 % (ref 0.0–0.2)

## 2020-11-29 LAB — BASIC METABOLIC PANEL
Anion gap: 6 (ref 5–15)
BUN: 19 mg/dL (ref 8–23)
CO2: 24 mmol/L (ref 22–32)
Calcium: 8.7 mg/dL — ABNORMAL LOW (ref 8.9–10.3)
Chloride: 104 mmol/L (ref 98–111)
Creatinine, Ser: 0.73 mg/dL (ref 0.44–1.00)
GFR, Estimated: >60 mL/min (ref >60)
Glucose, Bld: 116 mg/dL — ABNORMAL HIGH (ref 70–99)
Potassium: 4 mmol/L (ref 3.5–5.1)
Sodium: 134 mmol/L — ABNORMAL LOW (ref 135–145)

## 2020-11-29 MED ORDER — TRIAMTERENE-HCTZ 37.5-25 MG PO TABS
0.5000 | ORAL_TABLET | Freq: Once | ORAL | Status: AC
  Administered 2020-11-29: 0.5 via ORAL
  Filled 2020-11-29: qty 0.5

## 2020-11-29 MED ORDER — TRIAMTERENE-HCTZ 37.5-25 MG PO TABS
1.0000 | ORAL_TABLET | Freq: Every day | ORAL | Status: DC
  Administered 2020-11-30 – 2020-12-06 (×7): 1 via ORAL
  Filled 2020-11-29 (×7): qty 1

## 2020-11-29 MED ORDER — ATENOLOL 25 MG PO TABS
50.0000 mg | ORAL_TABLET | Freq: Two times a day (BID) | ORAL | Status: DC
  Administered 2020-11-29 – 2020-11-30 (×2): 50 mg via ORAL
  Filled 2020-11-29 (×2): qty 2

## 2020-11-29 NOTE — Progress Notes (Signed)
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  PMH includes: HTN, anemia, skin CA, and breast CA.    PT Comments    Pt able to make several laps with seated rest breaks in recliner after each lap.  Continues with slow shuffling gait with HHA x 1.  No knee buckling noted this session.  She was able to stop and sink to brush her teeth and void on commode during session.  Requested to return to bed and needs in reach at end of session.  Sats remain 93% on room air with mobility but she does report feeling SOB.  She is focused and voices concerns over elevated BP again this session.  She did discuss with ortho PA this AM during his visit during PT Session.   BP remains well within PT guidelines for therapy.   Recommendations for follow up therapy are one component of a multi-disciplinary discharge planning process, led by the attending physician.  Recommendations may be updated based on patient status, additional functional criteria and insurance authorization.  Follow Up Recommendations  SNF;Supervision for mobility/OOB     Equipment Recommendations       Recommendations for Other Services       Precautions / Restrictions Precautions Precautions: Fall;Shoulder Shoulder Interventions: Shoulder sling/immobilizer Restrictions Weight Bearing Restrictions: Yes RUE Weight Bearing: Non weight bearing     Mobility  Bed Mobility Overal bed mobility: Modified Independent Bed Mobility: Supine to Sit;Sit to Supine     Supine to sit: Supervision Sit to supine: Supervision        Transfers Overall transfer level: Needs assistance Equipment used: Straight cane;1 person hand held assist Transfers: Sit to/from  Stand Sit to Stand: Min guard;Min assist;From elevated surface            Ambulation/Gait Ambulation/Gait assistance: Min assist;Min guard Gait Distance (Feet): 20 Feet Assistive device: 1 person hand held assist   Gait velocity: decreased   General Gait Details: 30' x 3 and 15' x 1 with HHA   Stairs             Wheelchair Mobility    Modified Rankin (Stroke Patients Only)       Balance Overall balance assessment: Needs assistance Sitting-balance support: No upper extremity supported;Feet supported Sitting balance-Leahy Scale: Good     Standing balance support: Single extremity supported;During functional activity Standing balance-Leahy Scale: Poor Standing balance comment: Generally unsteady with short shuffling steps                            Cognition Arousal/Alertness: Awake/alert Behavior During Therapy: WFL for tasks assessed/performed Overall Cognitive Status: Within Functional Limits for tasks assessed                                        Exercises Other Exercises Other Exercises: standing at sink to brush teeth    General Comments        Pertinent Vitals/Pain Pain Assessment: Faces Faces Pain Scale: Hurts a little bit Pain Location: R shoulder Pain Descriptors / Indicators: Sore Pain Intervention(s): Limited activity within patient's tolerance;Monitored during session;Ice applied;Repositioned  Home Living                      Prior Function            PT Goals (current goals can now be found in the care plan section) Progress towards PT goals: Progressing toward goals    Frequency    BID      PT Plan Current plan remains appropriate    Co-evaluation              AM-PAC PT "6 Clicks" Mobility   Outcome Measure  Help needed turning from your back to your side while in a flat bed without using bedrails?: A Little Help needed moving from lying on your back to sitting on the  side of a flat bed without using bedrails?: A Little Help needed moving to and from a bed to a chair (including a wheelchair)?: A Little Help needed standing up from a chair using your arms (e.g., wheelchair or bedside chair)?: A Little Help needed to walk in hospital room?: A Little Help needed climbing 3-5 steps with a railing? : A Lot 6 Click Score: 17    End of Session Equipment Utilized During Treatment: Gait belt Activity Tolerance: Patient tolerated treatment well Patient left: in bed;with bed alarm set;with call bell/phone within reach;with SCD's reapplied;Other (comment) Nurse Communication: Mobility status;Other (comment) PT Visit Diagnosis: Unsteadiness on feet (R26.81);Difficulty in walking, not elsewhere classified (R26.2);Muscle weakness (generalized) (M62.81)     Time: 0145-0205 PT Time Calculation (min) (ACUTE ONLY): 20 min  Charges:  $Gait Training: 8-22 mins $Therapeutic Exercise: 8-22 mins                    Chesley Noon, PTA 11/29/20, 2:29 PM

## 2020-11-29 NOTE — TOC Progression Note (Addendum)
Transition of Care [facility]) - Progression Note    Patient Details  Name: Mariah Hogan MRN: [id number] Date of Birth: 1950-09-20  Transition of Care [facility]) CM/SW [place], RN Phone Number: 11/29/2020, 3:24 PM  Clinical Narrative:    Went to the bedside to review the bed offers with the patient, Reviewed the Medicare Star rating, she accepted the bed offer from [facility], I Notified [facility] and they came back with the fact that they do not have any beds available I reached out to [facility] and [facility] asking to review for a potential bed offer.    [facility] has made a bed offer and the patient accepted it    Expected Discharge Plan and Services                                                 Social Determinants of Health (SDOH) Interventions    Readmission Risk Interventions No flowsheet data found.

## 2020-11-29 NOTE — Progress Notes (Signed)
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  PMH includes: HTN, anemia, skin CA, and breast CA.   OT comments  Pt seen for OT tx this date. Pt ambulating back to the bedside after toileting with the nurse tech upon OT's arrival. After NT got vitals, pt sat EOB. Pt instructed in sling and polar care mgt as OT adjusted both for improved positioning and comfort. Pt educated in how to improve comfort and maintain optimal positiong while in bed using pillows to support RUE. Pt verbalized understanding and endorsed improved comfort after pillows positioned. Polar care refilled with visual instruction for how to manage at home. Pt continues to benefit from skilled OT services. Continue to recommend SNF at this time in order to maximize safety and return to PLOF, minimize risk of falls, and minimize risk of readmission.    Recommendations for follow up therapy are one component of a multi-disciplinary discharge planning process, led by the attending physician.  Recommendations may be updated based on patient status, additional functional criteria and insurance authorization.    Follow Up Recommendations  SNF    Equipment Recommendations  3 in 1 bedside commode    Recommendations for Other Services      Precautions / Restrictions Precautions Precautions: Fall;Shoulder Shoulder Interventions: Shoulder sling/immobilizer;At all times;Off for dressing/bathing/exercises;Shoulder abduction pillow Restrictions Weight Bearing Restrictions: Yes RUE Weight Bearing: Non weight bearing       Mobility Bed Mobility Overal bed mobility: Modified Independent Bed Mobility: Supine to Sit;Sit to Supine     Supine to sit:  Supervision Sit to supine: Supervision   General bed mobility comments: Continued extra time and effort with bed mobility with RUE NWBing, but no physical assistance needed    Transfers Overall transfer level: Needs assistance Equipment used: 1 person hand held assist Transfers: Sit to/from Stand Sit to Stand: Min guard;Min assist              Balance Overall balance assessment: Needs assistance Sitting-balance support: No upper extremity supported;Feet supported Sitting balance-Leahy Scale: Good     Standing balance support: Single extremity supported;During functional activity Standing balance-Leahy Scale: Poor Standing balance comment: reliant on UE support                           ADL either performed or assessed with clinical judgement   ADL Overall ADL's : Needs assistance/impaired                                       General ADL Comments: Pt continues to require significant assist for sling mgt, polar care, and LB dressing this date. SBA to CGA to ambulate back from bathroom wiht nurse tech.     Vision       Perception     Praxis      Cognition Arousal/Alertness: Awake/alert Behavior During Therapy: WFL for tasks assessed/performed Overall Cognitive Status: Within Functional Limits for tasks assessed  Exercises Other Exercises Other Exercises: Pt instructed in sling and polar care mgt as OT adjusted both for improved positioning and comfort. Pt educated in how to improve comfort and maintain optimal positiong while in bed using pillows to support RUE. Pt verbalized understanding and endorsed improved comfort after pillows positioned.   Shoulder Instructions       General Comments      Pertinent Vitals/ Pain       Pain Assessment: 0-10 Pain Score: 5  Faces Pain Scale: Hurts a little bit Pain Location: R shoulder Pain Descriptors / Indicators: Sore;Aching Pain  Intervention(s): Limited activity within patient's tolerance;Monitored during session;Premedicated before session;Repositioned;Ice applied  Home Living                                          Prior Functioning/Environment              Frequency  Min 1X/week        Progress Toward Goals  OT Goals(current goals can now be found in the care plan section)  Progress towards OT goals: Progressing toward goals  Acute Rehab OT Goals Patient Stated Goal: To be able to cook and care for myself OT Goal Formulation: With patient Time For Goal Achievement: 12/12/20 Potential to Achieve Goals: Good  Plan Discharge plan remains appropriate;Frequency remains appropriate    Co-evaluation                 AM-PAC OT "6 Clicks" Daily Activity     Outcome Measure   Help from another person eating meals?: A Little Help from another person taking care of personal grooming?: A Little Help from another person toileting, which includes using toliet, bedpan, or urinal?: A Little Help from another person bathing (including washing, rinsing, drying)?: A Lot Help from another person to put on and taking off regular upper body clothing?: A Lot Help from another person to put on and taking off regular lower body clothing?: A Lot 6 Click Score: 15    End of Session    OT Visit Diagnosis: Other abnormalities of gait and mobility (R26.89);Muscle weakness (generalized) (M62.81);Pain Pain - Right/Left: Right Pain - part of body: Shoulder;Arm   Activity Tolerance Patient tolerated treatment well   Patient Left in bed;with call bell/phone within reach;with bed alarm set   Nurse Communication          Time: 7425-9563 OT Time Calculation (min): 27 min  Charges: OT General Charges $OT Visit: 1 Visit OT Treatments $Self Care/Home Management : 23-37 mins  Ardeth Perfect., MPH, MS, OTR/L ascom [phone] 11/29/20, 4:20 PM

## 2020-11-29 NOTE — Plan of Care (Signed)

## 2020-11-29 NOTE — Progress Notes (Signed)
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  PMH includes: HTN, anemia, skin CA, and breast CA.    PT Comments    Pt ready for session.  OOB with supervision. Trial of gait with SPC but pt generally unsteady with slow shuffling steps and wobbly cane.  Changed to HHA for rest of gait and for 2 other 30' trials. (Total) of 3 x 30 in room to door and back.  She does have 1 buckle BLE's on 3rd trial but recovered with min/mod a x 1.   Standing ex at EOB x 10 with bed rail for support.  Remained in recliner after session.   Recommendations for follow up therapy are one component of a multi-disciplinary discharge planning process, led by the attending physician.  Recommendations may be updated based on patient status, additional functional criteria and insurance authorization.  Follow Up Recommendations  SNF;Supervision for mobility/OOB     Equipment Recommendations       Recommendations for Other Services       Precautions / Restrictions Precautions Precautions: Fall;Shoulder Shoulder Interventions: Shoulder sling/immobilizer Restrictions Weight Bearing Restrictions: Yes RUE Weight Bearing: Non weight bearing     Mobility  Bed Mobility Overal bed mobility: Modified Independent Bed Mobility: Supine to Sit;Sit to Supine     Supine to sit: Supervision Sit to supine: Supervision        Transfers Overall transfer level: Needs assistance Equipment used: Straight cane;1 person hand held assist Transfers: Sit to/from Stand Sit to Stand: Min guard;Min assist;From elevated surface            Ambulation/Gait Ambulation/Gait assistance: Min assist;Min guard Gait Distance (Feet): 20 Feet Assistive device:  Straight cane;1 person hand held assist   Gait velocity: decreased   General Gait Details: 20' x 3.   x 1 with SPC  x 2 with HHA - very slow hesitant steps but gait does improve with HHA vs SPC today .   x 1 knee buckling on 3rd trial recovered with min a x 1 due to fatigue   Stairs             Wheelchair Mobility    Modified Rankin (Stroke Patients Only)       Balance Overall balance assessment: Needs assistance Sitting-balance support: No upper extremity supported;Feet supported Sitting balance-Leahy Scale: Good     Standing balance support: Single extremity supported;During functional activity Standing balance-Leahy Scale: Poor Standing balance comment: Generally unsteady with short shuffling steps                            Cognition Arousal/Alertness: Awake/alert Behavior During Therapy: WFL for tasks assessed/performed Overall Cognitive Status: Within Functional Limits for tasks assessed                                        Exercises Other Exercises Other Exercises: standing LE ex 10 holding bedrail    General Comments        Pertinent Vitals/Pain Pain Assessment: Faces Faces Pain Scale: Hurts a little bit Pain Location: R shoulder Pain Descriptors / Indicators: Sore Pain Intervention(s): Limited activity within patient's  tolerance;Monitored during session;Repositioned;Ice applied    Home Living                      Prior Function            PT Goals (current goals can now be found in the care plan section) Progress towards PT goals: Progressing toward goals    Frequency    BID      PT Plan Current plan remains appropriate    Co-evaluation              AM-PAC PT "6 Clicks" Mobility   Outcome Measure  Help needed turning from your back to your side while in a flat bed without using bedrails?: A Little Help needed moving from lying on your back to sitting on the side of a flat bed without  using bedrails?: A Little Help needed moving to and from a bed to a chair (including a wheelchair)?: A Little Help needed standing up from a chair using your arms (e.g., wheelchair or bedside chair)?: A Little Help needed to walk in hospital room?: A Little Help needed climbing 3-5 steps with a railing? : A Lot 6 Click Score: 17    End of Session Equipment Utilized During Treatment: Gait belt Activity Tolerance: Patient tolerated treatment well Patient left: in bed;with bed alarm set;with call bell/phone within reach;with SCD's reapplied;Other (comment) Nurse Communication: Mobility status;Other (comment) PT Visit Diagnosis: Unsteadiness on feet (R26.81);Difficulty in walking, not elsewhere classified (R26.2);Muscle weakness (generalized) (M62.81)     Time: 2158-7276 PT Time Calculation (min) (ACUTE ONLY): 30 min  Charges:  $Gait Training: 8-22 mins $Therapeutic Exercise: 8-22 mins                    Chesley Noon, PTA 11/29/20, 2:23 PM

## 2020-11-29 NOTE — Progress Notes (Signed)
Subjective: 2 Days Post-Op Procedure(s) (LRB): REVERSE SHOULDER ARTHROPLASTY (Right) Patient reports pain as mild in the right shoulder but does report that the nerve block wore off last night.   Patient is well, SOB has improved. Patient still experiencing hypertension. Current plan is for discharge to SNF. Negative for chest pain and shortness of breath Fever: no Gastrointestinal:Negative for nausea and vomiting   Objective: Vital signs in last 24 hours: Temp:  [97.8 F (36.6 C)-98.8 F (37.1 C)] 97.8 F (36.6 C) (09/22 0748) Pulse Rate:  [63-76] 63 (09/22 0748) Resp:  [16-20] 18 (09/22 0748) BP: (152-181)/(47-87) 163/56 (09/22 0748) SpO2:  [95 %-98 %] 96 % (09/22 0748)  Intake/Output from previous day: No intake or output data in the 24 hours ending 11/29/20 1106  Intake/Output this shift: No intake/output data recorded.  Labs: Recent Labs    11/28/20 0342 11/29/20 0442  HGB 12.3 12.4   Recent Labs    11/28/20 0342 11/29/20 0442  WBC 11.2* 9.9  RBC 4.04 4.05  HCT 36.9 37.8  PLT 184 176   Recent Labs    11/28/20 0342 11/29/20 0442  NA 136 134*  K 4.1 4.0  CL 105 104  CO2 25 24  BUN 20 19  CREATININE 0.80 0.73  GLUCOSE 154* 116*  CALCIUM 8.6* 8.7*   No results for input(s): LABPT, INR in the last 72 hours.  EXAM General - Patient is Alert, Appropriate, and Oriented Lungs: Mild wheeze with auscultation, improved. Extremity - Neurologically intact Incision: dressing C/D/I No cellulitis present Compartment soft Sensation to light touch improved to the right arm. Dressing/Incision - clean, dry, no drainage Motor Function - intact, moving foot and toes well on exam.  Abdomen soft with normal bowel sounds intact.  Past Medical History:  Diagnosis Date   Anemia    Arthritis    Asthma    H/O YEARS AGO-NOW HAS COUGHING SPELLS WHICH IS WHY SHE HAS HER ALBUTEROL INHALER   Basal cell carcinoma    R nose ([facility])   Basal cell carcinoma 01/29/2016    Right forehead above med brow   Basal cell carcinoma 10/12/2019   L nasal tip - [facility] 05/15/2020   BRCA gene mutation negative 08/12/2017   Variant of unknown significance at [facility] only abnormality.   Breast cancer ([place]) 08/04/2017   1.8 cm ER 90%, PR 20%, HER-2/neu negative invasive mammary carcinoma of the left upper outer quadrant.  MammaPrint: High risk.   Cancer ([place])    [phone] basal cell carcinoma   Complication of anesthesia    HARD TIME WAKING UP   Dysplastic nevus 02/21/2010   Right mid pretibial, mild   Dysrhythmia    TACHYCARDIA   GERD (gastroesophageal reflux disease)    History of hiatal hernia    SMALL   Hyperlipidemia    Hypertension    PT STATES IN 01-18-18 PREOP PHONE INTERVIEW THAT HER BP HAS BEEN ELEVATED SINCE STARTING ON VALSARTAN- PCP SWITCHED PT BACK TO [place] ON 01-18-18 AND PT STATES THAT SHE HAS LOST 6-10 POUNDS OF FLUID SINCE RESTARTING MAXIDE.     Personal history of chemotherapy    PONV (postoperative nausea and vomiting)    Sjogren's syndrome ([place]) 07/2020   Assessment/Plan: 2 Days Post-Op Procedure(s) (LRB): REVERSE SHOULDER ARTHROPLASTY (Right) Active Problems:   Status post reverse arthroplasty of shoulder, right  Estimated body mass index is 25.91 kg/m as calculated from the following:   Height as of this encounter: 5' 6.5" (1.689 m).   Weight  as of this encounter: 73.9 kg. Advance diet Up with therapy D/C IV fluids when tolerating po intake.  Labs reviewed this AM.  WBC 9.9 Patient with continued elevated BP, 163/56.  Unable to take hydralazine due to allergy.  Increased routine dose of atenolol. Nerve block has worn off for the right shoulder.  Breathing and shortness of breath improved.  Patient with scarring from previous pneumonia and had phrenic nerve irritation from the block which is improving. Up with therapy today.  Work on [employer]. Plan for possible d/c to SNF tomorrow pending progress with PT.  DVT Prophylaxis - Lovenox and  Foot Pumps Non-weightbearing to the right arm.  Raquel Rawson [place], PA-C [facility] Orthopaedic Surgery 11/29/2020, 11:06 AM

## 2020-11-30 ENCOUNTER — Inpatient Hospital Stay: Payer: Medicare Other

## 2020-11-30 LAB — C-REACTIVE PROTEIN: CRP: 3.9 mg/dL — ABNORMAL HIGH (ref <1.0)

## 2020-11-30 LAB — BASIC METABOLIC PANEL
Anion gap: 8 (ref 5–15)
BUN: 20 mg/dL (ref 8–23)
CO2: 27 mmol/L (ref 22–32)
Calcium: 8.7 mg/dL — ABNORMAL LOW (ref 8.9–10.3)
Chloride: 98 mmol/L (ref 98–111)
Creatinine, Ser: 0.71 mg/dL (ref 0.44–1.00)
GFR, Estimated: >60 mL/min (ref >60)
Glucose, Bld: 141 mg/dL — ABNORMAL HIGH (ref 70–99)
Potassium: 3.8 mmol/L (ref 3.5–5.1)
Sodium: 133 mmol/L — ABNORMAL LOW (ref 135–145)

## 2020-11-30 LAB — CBC
HCT: 39 % (ref 36.0–46.0)
Hemoglobin: 13.2 g/dL (ref 12.0–15.0)
MCH: 31.4 pg (ref 26.0–34.0)
MCHC: 33.8 g/dL (ref 30.0–36.0)
MCV: 92.9 fL (ref 80.0–100.0)
Platelets: 197 10*3/uL (ref 150–400)
RBC: 4.2 MIL/uL (ref 3.87–5.11)
RDW: 12.6 % (ref 11.5–15.5)
WBC: 8 10*3/uL (ref 4.0–10.5)
nRBC: 0 % (ref 0.0–0.2)

## 2020-11-30 MED ORDER — FUROSEMIDE 10 MG/ML IJ SOLN
40.0000 mg | Freq: Every day | INTRAMUSCULAR | Status: DC
  Administered 2020-11-30 – 2020-12-01 (×2): 40 mg via INTRAVENOUS
  Filled 2020-11-30 (×3): qty 4

## 2020-11-30 MED ORDER — HYDROCODONE-ACETAMINOPHEN 5-325 MG PO TABS: 1.0000 | ORAL_TABLET | ORAL | 0 refills | Status: DC | PRN

## 2020-11-30 MED ORDER — ATENOLOL 50 MG PO TABS: 50.0000 mg | ORAL_TABLET | Freq: Two times a day (BID) | ORAL | 0 refills | Status: DC

## 2020-11-30 MED ORDER — PREDNISONE 20 MG PO TABS
40.0000 mg | ORAL_TABLET | Freq: Every day | ORAL | Status: DC
  Administered 2020-12-01 – 2020-12-02 (×2): 40 mg via ORAL
  Filled 2020-11-30 (×2): qty 2

## 2020-11-30 MED ORDER — TRIAMTERENE-HCTZ 37.5-25 MG PO TABS: 1.0000 | ORAL_TABLET | Freq: Every day | ORAL | 0 refills | Status: AC

## 2020-11-30 MED ORDER — ATENOLOL 25 MG PO TABS
12.5000 mg | ORAL_TABLET | Freq: Two times a day (BID) | ORAL | Status: DC
  Administered 2020-11-30 – 2020-12-06 (×12): 12.5 mg via ORAL
  Filled 2020-11-30 (×13): qty 1

## 2020-11-30 MED ORDER — ONDANSETRON HCL 4 MG PO TABS: 4.0000 mg | ORAL_TABLET | Freq: Four times a day (QID) | ORAL | 0 refills | Status: DC | PRN

## 2020-11-30 NOTE — Consult Note (Signed)
Pulmonary Medicine          Date: 11/30/2020,   MRN# [id number] Mariah Hogan 31-Aug-1950     AdmissionWeight: 73.9 kg                 CurrentWeight: 73.9 kg   Referring provider: Morley Kos   CHIEF COMPLAINT:   Acute cough with labored breathing.    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  She has developed cough with labored breathing and has not been able to exert herself due to ongoing Cough.  She had CXR done and it shows platelike atelectasis as well as interstitial infiltrates. There is also elevation of right hemidiaphragm. We discussed findings and medical plan.  PCCM consult placed for additional evaluation and management for discharge planning.    PAST MEDICAL HISTORY   Past Medical History:  Diagnosis Date   Anemia    Arthritis    Asthma    H/O YEARS AGO-NOW HAS COUGHING SPELLS WHICH IS WHY SHE HAS HER ALBUTEROL INHALER   Basal cell carcinoma    R nose (MOHS)   Basal cell carcinoma 01/29/2016   Right forehead above med brow   Basal cell carcinoma 10/12/2019   L nasal tip - MOHS 05/15/2020   BRCA gene mutation negative 08/12/2017   Variant of unknown significance at [facility] only abnormality.   Breast cancer ([place]) 08/04/2017   1.8 cm ER 90%, PR 20%, HER-2/neu negative invasive mammary carcinoma of the left upper outer quadrant.  MammaPrint: High risk.   Cancer ([place])    [phone] basal cell carcinoma   Complication of anesthesia    HARD TIME WAKING UP   Dysplastic nevus 02/21/2010   Right mid pretibial, mild   Dysrhythmia    TACHYCARDIA   GERD (gastroesophageal reflux disease)    History of hiatal hernia    SMALL   Hyperlipidemia    Hypertension    PT STATES IN 01-18-18 PREOP PHONE INTERVIEW THAT HER BP HAS BEEN ELEVATED SINCE STARTING ON VALSARTAN- PCP SWITCHED PT BACK TO [place]  ON 01-18-18 AND PT STATES THAT SHE HAS LOST 6-10 POUNDS OF FLUID SINCE RESTARTING MAXIDE.     Personal history of chemotherapy    PONV (postoperative nausea and vomiting)    Sjogren's syndrome ([place]) 07/2020     SURGICAL HISTORY   Past Surgical History:  Procedure Laterality Date   ABDOMINAL HYSTERECTOMY  1998   bladder tack  2002,2008   BREAST BIOPSY Left 1990   BREAST BIOPSY Left 2018   core bx- neg   BREAST BIOPSY Left 08/04/2017   left UOQ 2 oclock INVASIVE DUCTAL CARCINOMA.   BREAST BIOPSY Right 08/2017   MRI biopsy, FIBROCYSTIC CHANGE AND USUAL DUCTAL HYPERPLASIA,, clip placed   BREAST CYST EXCISION  x3   BREAST EXCISIONAL BIOPSY Bilateral    BREAST RECONSTRUCTION WITH PLACEMENT OF TISSUE EXPANDER AND FLEX HD (ACELLULAR HYDRATED DERMIS) Left 01/27/2018   Procedure: BREAST RECONSTRUCTION WITH PLACEMENT OF TISSUE EXPANDER AND FLEX HD (ACELLULAR HYDRATED DERMIS);  Surgeon: Wallace Going, DO;  Location: [facility] ORS;  Service: Plastics;  Laterality: Left;   BREAST SURGERY Left 02/13/14   Fibrocystic changes, pseudo-angiomatous stromal hyperplasia. No atypia or malignancy.   CARPAL TUNNEL RELEASE Bilateral x2   CHOLECYSTECTOMY  2008   COLONOSCOPY WITH PROPOFOL  N/A 01/12/2015   Procedure: COLONOSCOPY WITH PROPOFOL;  Surgeon: Manya Silvas, MD;  Location: [facility] ENDOSCOPY;  Service: Endoscopy;  Laterality: N/A;   COLONOSCOPY WITH PROPOFOL N/A 02/06/2020   Procedure: COLONOSCOPY WITH PROPOFOL;  Surgeon: Lesly Rubenstein, MD;  Location: [facility] ENDOSCOPY;  Service: Endoscopy;  Laterality: N/A;   ESOPHAGOGASTRODUODENOSCOPY (EGD) WITH PROPOFOL N/A 01/12/2015   Procedure: ESOPHAGOGASTRODUODENOSCOPY (EGD) WITH PROPOFOL;  Surgeon: Manya Silvas, MD;  Location: [facility] ENDOSCOPY;  Service: Endoscopy;  Laterality: N/A;   ESOPHAGOGASTRODUODENOSCOPY (EGD) WITH PROPOFOL N/A 02/06/2020   Procedure: ESOPHAGOGASTRODUODENOSCOPY (EGD) WITH PROPOFOL;  Surgeon: Lesly Rubenstein, MD;  Location: [facility]  ENDOSCOPY;  Service: Endoscopy;  Laterality: N/A;   LIPOSUCTION WITH LIPOFILLING Left 12/23/2018   Procedure: LIPOSUCTION WITH LIPOFILLING FROM ABDOMEN TO LEFT BREAST;  Surgeon: Wallace Going, DO;  Location: [place];  Service: Plastics;  Laterality: Left;  90 min, please   MASTECTOMY Left 2019   [facility]   MASTECTOMY W/ SENTINEL NODE BIOPSY Left 01/27/2018   ypT1c ypN0; ER 90%, PR 20%, HER-2/neu not overexpressed.  MASTECTOMY WITH SENTINEL LYMPH NODE BIOPSY;  Surgeon: Robert Bellow, MD;  Location: [facility] ORS;  Service: General;  Laterality: Left;   MINOR BREAST BIOPSY Right 09/02/2017   Radiology perform procedure, fibrocystic changes right retroareolar area.   NASAL RECONSTRUCTION  1983   OOPHORECTOMY     PORT-A-CATH REMOVAL     PORTACATH PLACEMENT N/A 10/21/2017   Procedure: INSERTION PORT-A-CATH;  Surgeon: Robert Bellow, MD;  Location: [facility] ORS;  Service: General;  Laterality: N/A;   REMOVAL OF TISSUE EXPANDER AND PLACEMENT OF IMPLANT Left 05/13/2018   Procedure: REMOVAL OF TISSUE EXPANDER AND PLACEMENT OF IMPLANT;  Surgeon: Wallace Going, DO;  Location: [facility] ORS;  Service: Plastics;  Laterality: Left;  total surgery time should be 3 hours, per provider   REVERSE SHOULDER ARTHROPLASTY Right 11/27/2020   Procedure: REVERSE SHOULDER ARTHROPLASTY;  Surgeon: Corky Mull, MD;  Location: [facility] ORS;  Service: Orthopedics;  Laterality: Right;   SAVORY DILATION N/A 01/12/2015   Procedure: SAVORY DILATION;  Surgeon: Manya Silvas, MD;  Location: [facility] ENDOSCOPY;  Service: Endoscopy;  Laterality: N/A;   TONSILLECTOMY AND ADENOIDECTOMY  1956     FAMILY HISTORY   Family History  Problem Relation Age of Onset   Breast cancer Sister 74       currently 75   Breast cancer Paternal Grandmother 6       bilateral; deceased 73s    Breast cancer Cousin 27       daughter of an unaffected maternal aunt   Breast cancer Cousin 9       kidney cancer also; daughter of an  unaffected maternal aunt   Other Mother        TAH/BSO prior to menopause   Prostate cancer Paternal Grandfather        age at dx unknown   Breast cancer Maternal Aunt        age at dx unknown   Breast cancer Cousin 22       daughter of an unaffected maternal aunt     SOCIAL HISTORY   Social History   Tobacco Use   Smoking status: Never   Smokeless tobacco: Never  Vaping Use   Vaping Use: Never used  Substance Use Topics   Alcohol use: No    Alcohol/week: 0.0 standard drinks   Drug use: No     MEDICATIONS    Home Medication:  Current  Outpatient Rx   Order #: [id number] Class: Normal   Order #: [id number] Class: Print   Order #: [id number] Class: Print   Order #: [id number] Class: Normal    Current Medication:  Current Facility-Administered Medications:    acetaminophen (TYLENOL) tablet 325-650 mg, 325-650 mg, Oral, Q6H PRN, Poggi, Marshall Cork, MD, 500 mg at 11/27/20 1515   albuterol (PROVENTIL) (2.5 MG/3ML) 0.083% nebulizer solution 2.5 mg, 2.5 mg, Nebulization, Q6H PRN, Noralee Space, RPH, 2.5 mg at 11/29/20 2135   atenolol (TENORMIN) tablet 50 mg, 50 mg, Oral, BID, Lattie Corns, PA-C, 50 mg at 11/30/20 1008   bisacodyl (DULCOLAX) suppository 10 mg, 10 mg, Rectal, Daily PRN, Poggi, Marshall Cork, MD   calcium carbonate (OS-CAL - dosed in mg of elemental calcium) tablet 500 mg of elemental calcium, 500 mg of elemental calcium, Oral, Q breakfast, Poggi, Marshall Cork, MD, 500 mg of elemental calcium at 11/30/20 0800   cholecalciferol (VITAMIN D3) tablet 2,000 Units, 2,000 Units, Oral, Daily, Poggi, Marshall Cork, MD, 2,000 Units at 11/30/20 1008   citalopram (CELEXA) tablet 20 mg, 20 mg, Oral, QHS, Poggi, Marshall Cork, MD, 20 mg at 11/29/20 2135   diphenhydrAMINE (BENADRYL) 12.5 MG/5ML elixir 12.5-25 mg, 12.5-25 mg, Oral, Q4H PRN, Poggi, Marshall Cork, MD   docusate sodium (COLACE) capsule 100 mg, 100 mg, Oral, BID, Poggi, Marshall Cork, MD, 100 mg at 11/30/20 1008   enoxaparin (LOVENOX) injection 40 mg, 40  mg, Subcutaneous, Q24H, Poggi, Marshall Cork, MD, 40 mg at 11/30/20 0900   furosemide (LASIX) injection 40 mg, 40 mg, Intravenous, Daily, Lanney Gins, Rondey Fallen, MD   gabapentin (NEURONTIN) capsule 300 mg, 300 mg, Oral, TID, Poggi, Marshall Cork, MD, 300 mg at 11/30/20 1008   HYDROcodone-acetaminophen (NORCO/VICODIN) 5-325 MG per tablet 1-2 tablet, 1-2 tablet, Oral, Q4H PRN, Poggi, Marshall Cork, MD, 1 tablet at 11/30/20 0802   magnesium hydroxide (MILK OF MAGNESIA) suspension 30 mL, 30 mL, Oral, Daily PRN, Poggi, Marshall Cork, MD, 30 mL at 11/29/20 1108   metoCLOPramide (REGLAN) tablet 5-10 mg, 5-10 mg, Oral, Q8H PRN **OR** metoCLOPramide (REGLAN) injection 5-10 mg, 5-10 mg, Intravenous, Q8H PRN, Poggi, Marshall Cork, MD   morphine 2 MG/ML injection 0.5-1 mg, 0.5-1 mg, Intravenous, Q2H PRN, Poggi, Marshall Cork, MD   ondansetron (ZOFRAN) tablet 4 mg, 4 mg, Oral, Q6H PRN **OR** ondansetron (ZOFRAN) injection 4 mg, 4 mg, Intravenous, Q6H PRN, Poggi, Marshall Cork, MD   pantoprazole (PROTONIX) EC tablet 40 mg, 40 mg, Oral, BID, Poggi, Marshall Cork, MD, 40 mg at 11/30/20 1008   [START ON 12/01/2020] predniSONE (DELTASONE) tablet 40 mg, 40 mg, Oral, Q breakfast, Jaquavius Hudler, MD   sodium phosphate (FLEET) 7-19 GM/118ML enema 1 enema, 1 enema, Rectal, Once PRN, Poggi, Marshall Cork, MD   triamterene-hydrochlorothiazide (MAXZIDE-25) 37.5-25 MG per tablet 1 tablet, 1 tablet, Oral, Daily, Poggi, Marshall Cork, MD, 1 tablet at 11/30/20 1009   valACYclovir (VALTREX) tablet 1,000 mg, 1,000 mg, Oral, Daily, Poggi, Marshall Cork, MD, 1,000 mg at 11/30/20 1008    ALLERGIES   Cytoxan [cyclophosphamide], Penicillin g, Taxotere [docetaxel], Other, Parabens, Shellac, Ciprofloxacin, Fluocinonide, Tape, and Triamcinolone     REVIEW OF SYSTEMS    Review of Systems:  Gen:  Denies  fever, sweats, chills weigh loss  HEENT: Denies blurred vision, double vision, ear pain, eye pain, hearing loss, nose bleeds, sore throat Cardiac:  No dizziness, chest pain or heaviness, chest  tightness,edema Resp:   Denies cough or sputum porduction, shortness of breath,wheezing, hemoptysis,  Gi: Denies swallowing difficulty, stomach  pain, nausea or vomiting, diarrhea, constipation, bowel incontinence Gu:  Denies bladder incontinence, burning urine Ext:   Denies Joint pain, stiffness or swelling Skin: Denies  skin rash, easy bruising or bleeding or hives Endoc:  Denies polyuria, polydipsia , polyphagia or weight change Psych:   Denies depression, insomnia or hallucinations   Other:  All other systems negative   VS: BP (!) 165/80 (BP Location: Left Leg)   Pulse 64   Temp 98.1 F (36.7 C) (Oral) Comment: I concur with Student Nurse.  Resp 14   Ht 5' 6.5" (1.689 m)   Wt 73.9 kg   SpO2 95%   BMI 25.91 kg/m      PHYSICAL EXAM    GENERAL:NAD, no fevers, chills, no weakness no fatigue HEAD: Normocephalic, atraumatic.  EYES: Pupils equal, round, reactive to light. Extraocular muscles intact. No scleral icterus.  MOUTH: Moist mucosal membrane. Dentition intact. No abscess noted.  EAR, NOSE, THROAT: Clear without exudates. No external lesions.  NECK: Supple. No thyromegaly. No nodules. No JVD.  PULMONARY: mild rhonchi bilaterally CARDIOVASCULAR: S1 and S2. Regular rate and rhythm. No murmurs, rubs, or gallops. No edema. Pedal pulses 2+ bilaterally.  GASTROINTESTINAL: Soft, nontender, nondistended. No masses. Positive bowel sounds. No hepatosplenomegaly.  MUSCULOSKELETAL: No swelling, clubbing, or edema. Range of motion full in all extremities.  NEUROLOGIC: Cranial nerves II through XII are intact. No gross focal neurological deficits. Sensation intact. Reflexes intact.  SKIN: No ulceration, lesions, rashes, or cyanosis. Skin warm and dry. Turgor intact.  PSYCHIATRIC: Mood, affect within normal limits. The patient is awake, alert and oriented x 3. Insight, judgment intact.       IMAGING    DG Chest Port 1 View  Result Date: 11/30/2020 CLINICAL DATA:  New cough,  wheezing, recent RIGHT shoulder replacement, history childhood asthma EXAM: PORTABLE CHEST 1 VIEW COMPARISON:  Portable exam 0818 hours compared to 11/28/2020 FINDINGS: Enlargement of cardiac silhouette. Mediastinal contours and pulmonary vascularity normal. Persistent elevation of RIGHT diaphragm with RIGHT basilar atelectasis. Remaining lungs clear. No definite infiltrate, pleural effusion, or pneumothorax. Bones demineralized with note of a RIGHT shoulder prosthesis. IMPRESSION: Elevation of RIGHT diaphragm with RIGHT basilar atelectasis. Enlargement of cardiac silhouette. Electronically Signed   By: Lavonia Dana M.D.   On: 11/30/2020 11:56   DG Chest Port 1 View  Result Date: 11/28/2020 CLINICAL DATA:  Chest congestion EXAM: PORTABLE CHEST 1 VIEW COMPARISON:  08/13/2020 FINDINGS: Cardiac enlargement and aortic atherosclerosis. Chronic asymmetric elevation of the right hemidiaphragm with overlying subsegmental atelectasis versus scarring. No pleural effusion or edema. No airspace consolidation. Previous right shoulder arthroplasty. IMPRESSION: Chronic asymmetric elevation of the right hemidiaphragm with overlying subsegmental atelectasis versus scarring. Electronically Signed   By: Kerby Moors M.D.   On: 11/28/2020 06:10   DG Shoulder Right Port  Result Date: 11/27/2020 CLINICAL DATA:  Status post reverse right shoulder arthroplasty EXAM: PORTABLE RIGHT SHOULDER COMPARISON:  None. FINDINGS: Status post right shoulder reverse total arthroplasty with expected overlying postoperative change. No evidence of perihardware fracture or component malposition. IMPRESSION: Status post right shoulder reverse total arthroplasty with expected overlying postoperative change. No evidence of perihardware fracture or component malposition. Electronically Signed   By: Eddie Candle M.D.   On: 11/27/2020 10:49   [place] OR NERVE BLOCK-IMAGE ONLY [facility])  Result Date: 11/27/2020 There is no interpretation for this exam.  This  order is for images obtained during a surgical procedure.  Please See "Surgeries" Tab for more information regarding the procedure.  ASSESSMENT/PLAN   Acute cough and labored breathing    - atelectasis bilaterally worse on right     - right hemidiaphragm dysfunction      -no consolidation /pneumonia at this moment    - stopped IVF at 75cc/h     - lasix 40 daily IV    - prednisone 40 daily PO    - Metaneb with Albuterol Q8h     -Reduce Atenelol to 12.5 BID   -DC morphine and dihenhydramine due to worsening of atelectasis, continue PO percoset for analgesia, may add tylenol and toradol if patient needs more pain control     Right hemidiaphragm dysfunction   - Sniff fluoroscopy study   - supine and upright PFT to elucidate severity of defect.        Thank you for allowing me to participate in the care of this patient.  Total face to face encounter time for this patient visit was >51mn. >50% of the time was  spent in counseling and coordination of care.   Patient/Family are satisfied with care plan and all questions have been answered.  This document was prepared using Dragon voice recognition software and may include unintentional dictation errors.     FOttie Glazier M.D.  Division of [place]

## 2020-11-30 NOTE — Care Management Important Message (Signed)
Important Message  Patient Details  Name: Mariah Hogan MRN: [id number] Date of Birth: 07/29/1950   Medicare Important Message Given:  N/A - LOS <3 / Initial given by admissions     Juliann Pulse A Deloy Archey 11/30/2020, 9:28 AM

## 2020-11-30 NOTE — Progress Notes (Signed)
Subjective: 3 Days Post-Op Procedure(s) (LRB): REVERSE SHOULDER ARTHROPLASTY (Right) Patient reports pain as mild in the right shoulder this morning.  Patient is having worsening wheezing and still some elevation in BP. BP meds were adjusted yesterday.  Patient received a breathing treatment last night. Patient with history of pneumonia in the past and scarring present on CT scan and x-rays. Current plan is for d/c to SNF when appropriate. Patient has had a BM. No recent fevers. No nausea/vomiting  Objective: Vital signs in last 24 hours: Temp:  [97.8 F (36.6 C)-99.1 F (37.3 C)] 98.3 F (36.8 C) (09/23 0436) Pulse Rate:  [63-80] 69 (09/23 0436) Resp:  [16-20] 20 (09/23 0436) BP: (112-189)/(56-93) 152/75 (09/23 0436) SpO2:  [93 %-97 %] 94 % (09/23 0436)  Intake/Output from previous day: No intake or output data in the 24 hours ending 11/30/20 0745  Intake/Output this shift: No intake/output data recorded.  Labs: Recent Labs    11/28/20 0342 11/29/20 0442  HGB 12.3 12.4   Recent Labs    11/28/20 0342 11/29/20 0442  WBC 11.2* 9.9  RBC 4.04 4.05  HCT 36.9 37.8  PLT 184 176   Recent Labs    11/29/20 0442 11/30/20 0516  NA 134* 133*  K 4.0 3.8  CL 104 98  CO2 24 27  BUN 19 20  CREATININE 0.73 0.71  GLUCOSE 116* 141*  CALCIUM 8.7* 8.7*   No results for input(s): LABPT, INR in the last 72 hours.  EXAM General - Patient is Alert, Appropriate, and Oriented Lungs: Moderate wheeze with auscultation.  No rhonchi/rales. Extremity - Neurologically intact Intact pulses distally Incision: dressing C/D/I No cellulitis present Compartment soft Sensation to light touch improved to the right arm. Dressing/Incision - clean, dry, no drainage Motor Function - intact, moving foot and toes well on exam.  Abdomen soft with normal bowel sounds intact.  Past Medical History:  Diagnosis Date   Anemia    Arthritis    Asthma    H/O YEARS AGO-NOW HAS COUGHING SPELLS  WHICH IS WHY SHE HAS HER ALBUTEROL INHALER   Basal cell carcinoma    R nose (MOHS)   Basal cell carcinoma 01/29/2016   Right forehead above med brow   Basal cell carcinoma 10/12/2019   L nasal tip - MOHS 05/15/2020   BRCA gene mutation negative 08/12/2017   Variant of unknown significance at [facility] only abnormality.   Breast cancer ([place]) 08/04/2017   1.8 cm ER 90%, PR 20%, HER-2/neu negative invasive mammary carcinoma of the left upper outer quadrant.  MammaPrint: High risk.   Cancer ([place])    [phone] basal cell carcinoma   Complication of anesthesia    HARD TIME WAKING UP   Dysplastic nevus 02/21/2010   Right mid pretibial, mild   Dysrhythmia    TACHYCARDIA   GERD (gastroesophageal reflux disease)    History of hiatal hernia    SMALL   Hyperlipidemia    Hypertension    PT STATES IN 01-18-18 PREOP PHONE INTERVIEW THAT HER BP HAS BEEN ELEVATED SINCE STARTING ON VALSARTAN- PCP SWITCHED PT BACK TO [place] ON 01-18-18 AND PT STATES THAT SHE HAS LOST 6-10 POUNDS OF FLUID SINCE RESTARTING MAXIDE.     Personal history of chemotherapy    PONV (postoperative nausea and vomiting)    Sjogren's syndrome ([place]) 07/2020   Assessment/Plan: 3 Days Post-Op Procedure(s) (LRB): REVERSE SHOULDER ARTHROPLASTY (Right) Active Problems:   Status post reverse arthroplasty of shoulder, right  Estimated body mass index is  25.91 kg/m as calculated from the following:   Height as of this encounter: 5' 6.5" (1.689 m).   Weight as of this encounter: 73.9 kg. Advance diet Up with therapy D/C IV fluids when tolerating po intake.  Labs reviewed this AM.  Ordered updated CBC for today. BP medication adjusted yesterday.  BP 152/75 this AM. Adjusted atenolol and maxzide yesterday. Patient has had a BM. Patient with increase wheeze this AM.  Patient has seen [facility] Pulmonology in the past.  Will obtain updated chest x-ray this morning.   Plan for discharge to SNF today vs. Over the weekend, insurance already  has approved bed.  DVT Prophylaxis - Lovenox and Foot Pumps Non-weightbearing to the right arm.  Raquel Marilynne Dupuis, PA-C [facility]opa[facility] Surgery 11/30/2020, 7:45 AM

## 2020-11-30 NOTE — Progress Notes (Signed)
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  PMH includes: HTN, anemia, skin CA, and breast CA.   OT comments  Pt seen for OT tx this date. Pt received in bed, agreeable to therapy and motivated to participate. Pt performed bed mobility with modified independence. CGA for ADL mobility, intermittently reaching with fingertips of L hand for the door frame or end of the bed as she is mildly unsteady but no overt LOB. With cues to slow down, dynamic balance improved. Pt performed toilet transfer with CGA, mod indep with lateral lean for pericare. Pt able to negotiate room around the bed to the recliner with CGA. Once seated in the recliner, pt instructed in hemi techniques for UB dressing with visual demonstration + verbal instruction, additional instruction in polar care + shoulder sling positioning, and shoulder precautions/ROM restrictions in the context of ADL and optimal UB clothing types. Pt verbalized understanding. Pt continues to benefit from skilled OT services. Continue to recommend SNF for further therapy in order to maximize safety/indep with eventual return home.   Recommendations for follow up therapy are one component of a multi-disciplinary discharge planning process, led by the attending physician.  Recommendations may be updated based on patient status, additional functional criteria and insurance authorization.    Follow Up Recommendations  SNF    Equipment Recommendations  3 in 1 bedside commode    Recommendations for Other Services      Precautions / Restrictions Precautions Precautions: Fall;Shoulder Shoulder Interventions: Shoulder sling/immobilizer;At all times;Off for  dressing/bathing/exercises;Shoulder abduction pillow Restrictions Weight Bearing Restrictions: Yes RUE Weight Bearing: Non weight bearing       Mobility Bed Mobility Overal bed mobility: Modified Independent                  Transfers Overall transfer level: Needs assistance Equipment used: None   Sit to Stand: Min guard         General transfer comment: mildly unsteady    Balance Overall balance assessment: Needs assistance Sitting-balance support: No upper extremity supported;Feet supported Sitting balance-Leahy Scale: Good     Standing balance support: No upper extremity supported;During functional activity Standing balance-Leahy Scale: Fair Standing balance comment: PR UE support (fingertip touch) on door frame/end of bed                           ADL either performed or assessed with clinical judgement   ADL Overall ADL's : Needs assistance/impaired     Grooming: Standing;Oral care;Supervision/safety                   Toilet Transfer: Ambulation;Regular Toilet;Min guard   Toileting- Clothing Manipulation and Hygiene: Sitting/lateral lean;Modified independent       Functional mobility during ADLs: Min guard       Vision       Perception     Praxis      Cognition Arousal/Alertness: Awake/alert Behavior During Therapy: WFL for tasks assessed/performed Overall Cognitive Status: Within Functional Limits for tasks assessed  Exercises Other Exercises Other Exercises: Pt instructed in hemi techniques for UB dressing with visual demonstration + verbal instruction, additional instruction in polar care + shoulder sling positioning, and shoulder precautions/ROM restrictions in the context of ADL and optimal UB clothing types.   Shoulder Instructions       General Comments      Pertinent Vitals/ Pain       Pain Assessment: 0-10 Pain Score: 3  Pain Location: R  shoulder Pain Descriptors / Indicators: Sore;Aching Pain Intervention(s): Limited activity within patient's tolerance;Monitored during session;Premedicated before session;Repositioned;Ice applied  Home Living                                          Prior Functioning/Environment              Frequency  Min 1X/week        Progress Toward Goals  OT Goals(current goals can now be found in the care plan section)  Progress towards OT goals: Progressing toward goals  Acute Rehab OT Goals Patient Stated Goal: To be able to cook and care for myself OT Goal Formulation: With patient Time For Goal Achievement: 12/12/20 Potential to Achieve Goals: Good  Plan Discharge plan remains appropriate;Frequency remains appropriate    Co-evaluation                 AM-PAC OT "6 Clicks" Daily Activity     Outcome Measure   Help from another person eating meals?: A Little Help from another person taking care of personal grooming?: A Little Help from another person toileting, which includes using toliet, bedpan, or urinal?: A Little Help from another person bathing (including washing, rinsing, drying)?: A Lot Help from another person to put on and taking off regular upper body clothing?: A Lot Help from another person to put on and taking off regular lower body clothing?: A Lot 6 Click Score: 15    End of Session    OT Visit Diagnosis: Other abnormalities of gait and mobility (R26.89);Muscle weakness (generalized) (M62.81);Pain Pain - Right/Left: Right Pain - part of body: Shoulder;Arm   Activity Tolerance Patient tolerated treatment well   Patient Left in chair;with call bell/phone within reach;with chair alarm set;with nursing/sitter in room;with SCD's reapplied;Other (comment) (polar care, sling in place)   Nurse Communication          Time: 1771-1657 OT Time Calculation (min): 24 min  Charges: OT General Charges $OT Visit: 1 Visit OT  Treatments $Self Care/Home Management : 23-37 mins  Ardeth Perfect., MPH, MS, OTR/L ascom [phone] 11/30/20, 1:45 PM

## 2020-11-30 NOTE — Plan of Care (Signed)

## 2020-11-30 NOTE — Discharge Instructions (Signed)
Diet: As you were doing prior to hospitalization   Shower:  May shower but keep the wounds dry, use an occlusive plastic wrap, NO SOAKING IN TUB.  If the bandage gets wet, change with a clean dry gauze.  Dressing:  You may change your dressing as needed. Change the dressing with sterile gauze dressing.    Activity:  Increase activity slowly as tolerated, but follow the weight bearing instructions below.  No lifting or driving for 6 weeks.  Weight Bearing:   Non-weightbearing to the right arm.  To prevent constipation: you may use a stool softener such as -  Colace (over the counter) 100 mg by mouth twice a day  Drink plenty of fluids (prune juice may be helpful) and high fiber foods Miralax (over the counter) for constipation as needed.    Itching:  If you experience itching with your medications, try taking only a single pain pill, or even half a pain pill at a time.  You may take up to 10 pain pills per day, and you can also use benadryl over the counter for itching or also to help with sleep.   Precautions:  If you experience chest pain or shortness of breath - call 911 immediately for transfer to the hospital emergency department!!  If you develop a fever greater that 101 F, purulent drainage from wound, increased redness or drainage from wound, or calf pain-Call [place]                                              Follow- Up Appointment:  Please call for an appointment to be seen in 2 weeks at [facility]

## 2020-11-30 NOTE — Progress Notes (Signed)
I concur with Student Nurse.

## 2020-12-01 ENCOUNTER — Inpatient Hospital Stay: Payer: Medicare Other

## 2020-12-01 LAB — COMPREHENSIVE METABOLIC PANEL
ALT: 22 U/L (ref 0–44)
AST: 24 U/L (ref 15–41)
Albumin: 3.5 g/dL (ref 3.5–5.0)
Alkaline Phosphatase: 110 U/L (ref 38–126)
Anion gap: 12 (ref 5–15)
BUN: 26 mg/dL — ABNORMAL HIGH (ref 8–23)
CO2: 27 mmol/L (ref 22–32)
Calcium: 9.5 mg/dL (ref 8.9–10.3)
Chloride: 92 mmol/L — ABNORMAL LOW (ref 98–111)
Creatinine, Ser: 0.84 mg/dL (ref 0.44–1.00)
GFR, Estimated: >60 mL/min (ref >60)
Glucose, Bld: 185 mg/dL — ABNORMAL HIGH (ref 70–99)
Potassium: 3.8 mmol/L (ref 3.5–5.1)
Sodium: 131 mmol/L — ABNORMAL LOW (ref 135–145)
Total Bilirubin: 1.1 mg/dL (ref 0.3–1.2)
Total Protein: 6.5 g/dL (ref 6.5–8.1)

## 2020-12-01 MED ORDER — PANTOPRAZOLE SODIUM 20 MG PO TBEC
20.0000 mg | DELAYED_RELEASE_TABLET | Freq: Every day | ORAL | Status: DC
  Administered 2020-12-02 – 2020-12-06 (×4): 20 mg via ORAL
  Filled 2020-12-01 (×6): qty 1

## 2020-12-01 MED ORDER — IOHEXOL 350 MG/ML SOLN
75.0000 mL | Freq: Once | INTRAVENOUS | Status: AC | PRN
  Administered 2020-12-01: 75 mL via INTRAVENOUS

## 2020-12-01 NOTE — Plan of Care (Signed)

## 2020-12-01 NOTE — Progress Notes (Signed)
Subjective: 4 Days Post-Op Procedure(s) (LRB): REVERSE SHOULDER ARTHROPLASTY (Right) Patient reports pain as mild in the right shoulder this morning.  Patient reports that her breathing is much improved.  BP still slightly elevated. Pulmonology consulted yesterday.  Breathing treatments effective, medications adjusted. Current plan is still for discharge to SNF when appropriate. Patient has had a BM. No recent fevers. No nausea/vomiting  Objective: Vital signs in last 24 hours: Temp:  [97.9 F (36.6 C)-98.5 F (36.9 C)] 98.5 F (36.9 C) (09/24 0801) Pulse Rate:  [64-81] 64 (09/24 0801) Resp:  [14-24] 16 (09/24 0801) BP: (109-171)/(64-108) 157/81 (09/24 0801) SpO2:  [94 %-97 %] 96 % (09/24 0801)  Intake/Output from previous day:  Intake/Output Summary (Last 24 hours) at 12/01/2020 0913 Last data filed at 11/30/2020 1852 Gross per 24 hour  Intake 2360 ml  Output --  Net 2360 ml    Intake/Output this shift: No intake/output data recorded.  Labs: Recent Labs    11/29/20 0442 11/30/20 0516  HGB 12.4 13.2   Recent Labs    11/29/20 0442 11/30/20 0516  WBC 9.9 8.0  RBC 4.05 4.20  HCT 37.8 39.0  PLT 176 197   Recent Labs    11/29/20 0442 11/30/20 0516  NA 134* 133*  K 4.0 3.8  CL 104 98  CO2 24 27  BUN 19 20  CREATININE 0.73 0.71  GLUCOSE 116* 141*  CALCIUM 8.7* 8.7*   No results for input(s): LABPT, INR in the last 72 hours.  EXAM General - Patient is Alert, Appropriate, and Oriented Lungs:  Wheeze has significantly improved this morning. Extremity - Neurologically intact Intact pulses distally Incision: dressing C/D/I No cellulitis present Compartment soft Sensation to light touch improved to the right arm. Dressing/Incision - clean, dry, no drainage Motor Function - intact, moving foot and toes well on exam.  Abdomen soft with normal bowel sounds intact.  Past Medical History:  Diagnosis Date   Anemia    Arthritis    Asthma    H/O YEARS  AGO-NOW HAS COUGHING SPELLS WHICH IS WHY SHE HAS HER ALBUTEROL INHALER   Basal cell carcinoma    R nose ([facility])   Basal cell carcinoma 01/29/2016   Right forehead above med brow   Basal cell carcinoma 10/12/2019   L nasal tip - [facility] 05/15/2020   BRCA gene mutation negative 08/12/2017   Variant of unknown significance at [facility] only abnormality.   Breast cancer ([place]) 08/04/2017   1.8 cm ER 90%, PR 20%, HER-2/neu negative invasive mammary carcinoma of the left upper outer quadrant.  MammaPrint: High risk.   Cancer ([place])    [phone] basal cell carcinoma   Complication of anesthesia    HARD TIME WAKING UP   Dysplastic nevus 02/21/2010   Right mid pretibial, mild   Dysrhythmia    TACHYCARDIA   GERD (gastroesophageal reflux disease)    History of hiatal hernia    SMALL   Hyperlipidemia    Hypertension    PT STATES IN 01-18-18 PREOP PHONE INTERVIEW THAT HER BP HAS BEEN ELEVATED SINCE STARTING ON VALSARTAN- PCP SWITCHED PT BACK TO [place] ON 01-18-18 AND PT STATES THAT SHE HAS LOST 6-10 POUNDS OF FLUID SINCE RESTARTING MAXIDE.     Personal history of chemotherapy    PONV (postoperative nausea and vomiting)    Sjogren's syndrome ([place]) 07/2020   Assessment/Plan: 4 Days Post-Op Procedure(s) (LRB): REVERSE SHOULDER ARTHROPLASTY (Right) Active Problems:   Status post reverse arthroplasty of shoulder, right  Estimated body  mass index is 25.91 kg/m as calculated from the following:   Height as of this encounter: 5' 6.5" (1.689 m).   Weight as of this encounter: 73.9 kg. Advance diet Up with therapy D/C IV fluids when tolerating po intake.  BMP ordered for this morning. BP medication adjusted, 1157/81 this morning.   Pulmonology consulted, adjusted medications and testing has been ordered.  Appreciate input. Patient has had a BM. Plan is for d/c to SNF, likely on Monday pending progess.  DVT Prophylaxis - Lovenox and Foot Pumps Non-weightbearing to the right arm.  Raquel Sevag Shearn, PA-C [facility] 12/01/2020, 9:13 AM

## 2020-12-01 NOTE — Progress Notes (Signed)
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  PMH includes: HTN, anemia, skin CA, and Left breast CA.    PT Comments    Pt in bed upon entry, agreeable to session, reports breathing difficulty largely resolves since previous day, also reports improved leg use previous day with trip to [place]. Supervision assistance or less provided throughout. Pt able to march in place, perform STS x8 from chair, AMB 91f, all slowly with intermittent SOB, unsteadiness, but overall improvement but by metrics and pt report. Pt up to chair at EOS. Which update frequency to QD treatments at this time.     Recommendations for follow up therapy are one component of a multi-disciplinary discharge planning process, led by the attending physician.  Recommendations may be updated based on patient status, additional functional criteria and insurance authorization.  Follow Up Recommendations  SNF;Supervision for mobility/OOB     Equipment Recommendations  None recommended by PT    Recommendations for Other Services       Precautions / Restrictions       Mobility  Bed Mobility Overal bed mobility: Modified Independent Bed Mobility: Supine to Sit     Supine to sit: Modified independent (Device/Increase time);HOB elevated          Transfers Overall transfer level: Needs assistance Equipment used: None (chairback) Transfers: Sit to/from Stand Sit to Stand: Supervision         General transfer comment: recliner + pillow to allow multiple reps (x8)  Ambulation/Gait Ambulation/Gait assistance: Supervision Gait Distance (Feet): 70 Feet Assistive device: 1 person hand held assist Gait Pattern/deviations: Step-to pattern Gait velocity:  <0.130m   General Gait Details: given 1 HHA for confidence, albeit no pressure utilized, pt moves <0.1073mstops twice for recover brief SOB   Stairs             Wheelchair Mobility    Modified Rankin (Stroke Patients Only)       Balance                                            Cognition Arousal/Alertness: Awake/alert Behavior During Therapy: WFL for tasks assessed/performed Overall Cognitive Status: Within Functional Limits for tasks assessed                                        Exercises General Exercises - Lower Extremity Long Arc Quad: AROM;Both;15 reps;Seated Other Exercises Other Exercises: Marching in place 1x20    General Comments        Pertinent Vitals/Pain      Home Living                      Prior Function            PT Goals (current goals can now be found in the care plan section) Acute Rehab PT Goals Patient Stated Goal: To be able to cook and care for myself PT Goal Formulation: With patient Time For Goal Achievement: 12/10/20 Potential to Achieve Goals: Good Progress towards PT goals: Progressing toward  goals    Frequency    7X/week      PT Plan Current plan remains appropriate;Frequency needs to be updated    Co-evaluation              AM-PAC PT "6 Clicks" Mobility   Outcome Measure  Help needed turning from your back to your side while in a flat bed without using bedrails?: A Little Help needed moving from lying on your back to sitting on the side of a flat bed without using bedrails?: A Little Help needed moving to and from a bed to a chair (including a wheelchair)?: A Little Help needed standing up from a chair using your arms (e.g., wheelchair or bedside chair)?: A Little Help needed to walk in hospital room?: A Little Help needed climbing 3-5 steps with a railing? : A Little 6 Click Score: 18    End of Session   Activity Tolerance: Patient tolerated  treatment well;Patient limited by fatigue Patient left: in chair;with nursing/sitter in room;with call bell/phone within reach Nurse Communication: Mobility status;Other (comment) PT Visit Diagnosis: Unsteadiness on feet (R26.81);Difficulty in walking, not elsewhere classified (R26.2);Muscle weakness (generalized) (M62.81)     Time: 5189-8421 PT Time Calculation (min) (ACUTE ONLY): 23 min  Charges:  $Therapeutic Exercise: 8-22 mins $Neuromuscular Re-education: 8-22 mins                    9:53 AM, 12/01/20 Etta Grandchild, PT, DPT Physical Therapist - [facility]  [phone] ([place])     [facility] 12/01/2020, 9:51 AM

## 2020-12-01 NOTE — Progress Notes (Signed)
Angiogram ordered and per Radiology, requires 20g access at forearm or higher. 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 Verbal ordered received from Dr. Lanney Gins to place 20g IV access in forearm of R surgical arm.  IV Access Team to place.

## 2020-12-01 NOTE — Progress Notes (Signed)
PT Cancellation Note  Patient Details Name: Mariah Hogan MRN: [id number] DOB: 1951-02-13   Cancelled Treatment:    Reason Eval/Treat Not Completed: Medical issues which prohibited therapy. Per OT pt with new wheezing issue this date, waiting for new consult and CXR. Will hold therapy until workup complete.   9:46 AM, 12/01/20 Etta Grandchild, PT, DPT Physical Therapist - [facility]  [phone] ([place])      Donnie Panik C 12/01/2020, 9:45 AM

## 2020-12-01 NOTE — Progress Notes (Signed)
Pulmonary Medicine          Date: 12/01/2020,   MRN# [id number] Mariah Hogan May 30, 1950     AdmissionWeight: 73.9 kg                 CurrentWeight: 73.9 kg   Referring provider: Morley Kos   CHIEF COMPLAINT:   Acute cough with labored breathing.    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  She has developed cough with labored breathing and has not been able to exert herself due to ongoing Cough.  She had CXR done and it shows platelike atelectasis as well as interstitial infiltrates. There is also elevation of right hemidiaphragm. We discussed findings and medical plan.  PCCM consult placed for additional evaluation and management for discharge planning.    12/01/20- Patient is improved, she is with less labored breathing.  She is on room air and is able to participate with Metaneb with albuterol.  She is able to take larger tidal volues on incentive spirometer. She diuresed well but urine output was not documented, patient shares it was vigorous. Her sodium levels are slightly lower due to diuresis and I have asked patient to use more salt with meals as she currently avoids salt intake PO. This is transient and will resolve.  Her sugars are slightly higher due to steroids and this also is temporary and does not require additional therapy. I reviewed her CXR with her today and there is much less edema but atelectasis persists on right and left hemidiaphragm still appears higher up then normal especially when compared to CT in June 2022.  We will repeat CT chest today with contrast to evalute for PE and other possible lesions of concern on differential.  In there interim plan is to continue to diurese and continue prednisone at current dose.   PAST MEDICAL HISTORY   Past Medical History:  Diagnosis Date   Anemia     Arthritis    Asthma    H/O YEARS AGO-NOW HAS COUGHING SPELLS WHICH IS WHY SHE HAS HER ALBUTEROL INHALER   Basal cell carcinoma    R nose ([facility])   Basal cell carcinoma 01/29/2016   Right forehead above med brow   Basal cell carcinoma 10/12/2019   L nasal tip - [facility] 05/15/2020   BRCA gene mutation negative 08/12/2017   Variant of unknown significance at [facility] only abnormality.   Breast cancer ([place]) 08/04/2017   1.8 cm ER 90%, PR 20%, HER-2/neu negative invasive mammary carcinoma of the left upper outer quadrant.  MammaPrint: High risk.   Cancer ([place])    [phone] basal cell carcinoma   Complication of anesthesia    HARD TIME WAKING UP   Dysplastic nevus 02/21/2010   Right mid pretibial, mild   Dysrhythmia    TACHYCARDIA   GERD (gastroesophageal reflux disease)    History of hiatal hernia    SMALL   Hyperlipidemia    Hypertension    PT STATES IN 01-18-18 PREOP PHONE INTERVIEW THAT HER BP HAS BEEN ELEVATED SINCE STARTING ON VALSARTAN- PCP SWITCHED PT BACK TO [place] ON 01-18-18 AND PT STATES THAT SHE HAS LOST 6-10 POUNDS OF FLUID SINCE RESTARTING MAXIDE.     Personal history of chemotherapy    PONV (postoperative nausea and vomiting)  Sjogren's syndrome ([place]) 07/2020     SURGICAL HISTORY   Past Surgical History:  Procedure Laterality Date   ABDOMINAL HYSTERECTOMY  1998   bladder tack  2002,2008   BREAST BIOPSY Left 1990   BREAST BIOPSY Left 2018   core bx- neg   BREAST BIOPSY Left 08/04/2017   left UOQ 2 oclock INVASIVE DUCTAL CARCINOMA.   BREAST BIOPSY Right 08/2017   MRI biopsy, FIBROCYSTIC CHANGE AND USUAL DUCTAL HYPERPLASIA,, clip placed   BREAST CYST EXCISION  x3   BREAST EXCISIONAL BIOPSY Bilateral    BREAST RECONSTRUCTION WITH PLACEMENT OF TISSUE EXPANDER AND FLEX HD (ACELLULAR HYDRATED DERMIS) Left 01/27/2018   Procedure: BREAST RECONSTRUCTION WITH PLACEMENT OF TISSUE EXPANDER AND FLEX HD (ACELLULAR HYDRATED DERMIS);  Surgeon: Wallace Going, DO;   Location: [facility];  Service: Plastics;  Laterality: Left;   BREAST SURGERY Left 02/13/14   Fibrocystic changes, pseudo-angiomatous stromal hyperplasia. No atypia or malignancy.   CARPAL TUNNEL RELEASE Bilateral x2   CHOLECYSTECTOMY  2008   COLONOSCOPY WITH PROPOFOL N/A 01/12/2015   Procedure: COLONOSCOPY WITH PROPOFOL;  Surgeon: Manya Silvas, MD;  Location: [facility] ENDOSCOPY;  Service: Endoscopy;  Laterality: N/A;   COLONOSCOPY WITH PROPOFOL N/A 02/06/2020   Procedure: COLONOSCOPY WITH PROPOFOL;  Surgeon: Lesly Rubenstein, MD;  Location: [facility] ENDOSCOPY;  Service: Endoscopy;  Laterality: N/A;   ESOPHAGOGASTRODUODENOSCOPY (EGD) WITH PROPOFOL N/A 01/12/2015   Procedure: ESOPHAGOGASTRODUODENOSCOPY (EGD) WITH PROPOFOL;  Surgeon: Manya Silvas, MD;  Location: [facility] ENDOSCOPY;  Service: Endoscopy;  Laterality: N/A;   ESOPHAGOGASTRODUODENOSCOPY (EGD) WITH PROPOFOL N/A 02/06/2020   Procedure: ESOPHAGOGASTRODUODENOSCOPY (EGD) WITH PROPOFOL;  Surgeon: Lesly Rubenstein, MD;  Location: [facility] ENDOSCOPY;  Service: Endoscopy;  Laterality: N/A;   LIPOSUCTION WITH LIPOFILLING Left 12/23/2018   Procedure: LIPOSUCTION WITH LIPOFILLING FROM ABDOMEN TO LEFT BREAST;  Surgeon: Wallace Going, DO;  Location: [place];  Service: Plastics;  Laterality: Left;  90 min, please   MASTECTOMY Left 2019   [facility]   MASTECTOMY W/ SENTINEL NODE BIOPSY Left 01/27/2018   ypT1c ypN0; ER 90%, PR 20%, HER-2/neu not overexpressed.  MASTECTOMY WITH SENTINEL LYMPH NODE BIOPSY;  Surgeon: Robert Bellow, MD;  Location: [facility];  Service: General;  Laterality: Left;   MINOR BREAST BIOPSY Right 09/02/2017   Radiology perform procedure, fibrocystic changes right retroareolar area.   NASAL RECONSTRUCTION  1983   OOPHORECTOMY     PORT-A-CATH REMOVAL     PORTACATH PLACEMENT N/A 10/21/2017   Procedure: INSERTION PORT-A-CATH;  Surgeon: Robert Bellow, MD;  Location: [facility] OR[facility];  Service: General;  Laterality: N/A;    REMOVAL OF TISSUE EXPANDER AND PLACEMENT OF IMPLANT Left 05/13/2018   Procedure: REMOVAL OF TISSUE EXPANDER AND PLACEMENT OF IMPLANT;  Surgeon: Wallace Going, DO;  Location: [facility];  Service: Plastics;  Laterality: Left;  total surgery time should be 3 hours, per provider   REVERSE SHOULDER ARTHROPLASTY Right 11/27/2020   Procedure: REVERSE SHOULDER ARTHROPLASTY;  Surgeon: Corky Mull, MD;  Location: [facility];  Service: Orthopedics;  Laterality: Right;   SAVORY DILATION N/A 01/12/2015   Procedure: SAVORY DILATION;  Surgeon: Manya Silvas, MD;  Location: [place] ENDOSCOPY;  Service: Endoscopy;  Laterality: N/A;   TONSILLECTOMY AND ADENOIDECTOMY  1956     FAMILY HISTORY   Family History  Problem Relation Age of Onset   Breast cancer Sister 90       currently 13   Breast cancer Paternal Grandmother 22  bilateral; deceased 78s    Breast cancer Cousin 49       daughter of an unaffected maternal aunt   Breast cancer Cousin 64       kidney cancer also; daughter of an unaffected maternal aunt   Other Mother        TAH/BSO prior to menopause   Prostate cancer Paternal Grandfather        age at dx unknown   Breast cancer Maternal Aunt        age at dx unknown   Breast cancer Cousin 26       daughter of an unaffected maternal aunt     SOCIAL HISTORY   Social History   Tobacco Use   Smoking status: Never   Smokeless tobacco: Never  Vaping Use   Vaping Use: Never used  Substance Use Topics   Alcohol use: No    Alcohol/week: 0.0 standard drinks   Drug use: No     MEDICATIONS    Home Medication:  Current Outpatient Rx   Order #: [id number] Class: Normal   Order #: [id number] Class: Print   Order #: [id number] Class: Print   Order #: [id number] Class: Normal    Current Medication:  Current Facility-Administered Medications:    acetaminophen (TYLENOL) tablet 325-650 mg, 325-650 mg, Oral, Q6H PRN, Poggi, Marshall Cork, MD, 500 mg at 11/27/20 1515   albuterol (PROVENTIL)  (2.5 MG/3ML) 0.083% nebulizer solution 2.5 mg, 2.5 mg, Nebulization, Q6H PRN, Noralee Space, RPH, 2.5 mg at 12/01/20 1318   atenolol (TENORMIN) tablet 12.5 mg, 12.5 mg, Oral, BID, Lanney Gins, Staci Dack, MD, 12.5 mg at 12/01/20 8828   bisacodyl (DULCOLAX) suppository 10 mg, 10 mg, Rectal, Daily PRN, Poggi, Marshall Cork, MD   calcium carbonate (OS-CAL - dosed in mg of elemental calcium) tablet 500 mg of elemental calcium, 500 mg of elemental calcium, Oral, Q breakfast, Poggi, Marshall Cork, MD, 500 mg of elemental calcium at 12/01/20 0943   cholecalciferol (VITAMIN D3) tablet 2,000 Units, 2,000 Units, Oral, Daily, Poggi, Marshall Cork, MD, 2,000 Units at 12/01/20 0951   citalopram (CELEXA) tablet 20 mg, 20 mg, Oral, QHS, Poggi, Marshall Cork, MD, 20 mg at 11/30/20 2147   enoxaparin (LOVENOX) injection 40 mg, 40 mg, Subcutaneous, Q24H, Poggi, Marshall Cork, MD, 40 mg at 12/01/20 0034   furosemide (LASIX) injection 40 mg, 40 mg, Intravenous, Daily, Ottie Glazier, MD, 40 mg at 11/30/20 1453   gabapentin (NEURONTIN) capsule 300 mg, 300 mg, Oral, TID, Poggi, Marshall Cork, MD, 300 mg at 12/01/20 1548   HYDROcodone-acetaminophen (NORCO/VICODIN) 5-325 MG per tablet 1-2 tablet, 1-2 tablet, Oral, Q4H PRN, Poggi, Marshall Cork, MD, 1 tablet at 12/01/20 1457   magnesium hydroxide (MILK OF MAGNESIA) suspension 30 mL, 30 mL, Oral, Daily PRN, Poggi, Marshall Cork, MD, 30 mL at 11/29/20 1108   metoCLOPramide (REGLAN) tablet 5-10 mg, 5-10 mg, Oral, Q8H PRN **OR** metoCLOPramide (REGLAN) injection 5-10 mg, 5-10 mg, Intravenous, Q8H PRN, Poggi, Marshall Cork, MD   ondansetron (ZOFRAN) tablet 4 mg, 4 mg, Oral, Q6H PRN **OR** ondansetron (ZOFRAN) injection 4 mg, 4 mg, Intravenous, Q6H PRN, Poggi, Marshall Cork, MD   pantoprazole (PROTONIX) EC tablet 40 mg, 40 mg, Oral, BID, Poggi, Marshall Cork, MD, 40 mg at 12/01/20 0951   predniSONE (DELTASONE) tablet 40 mg, 40 mg, Oral, Q breakfast, Lanney Gins, Kissie Ziolkowski, MD, 40 mg at 12/01/20 0951   sodium phosphate (FLEET) 7-19 GM/118ML enema 1 enema, 1 enema,  Rectal, Once PRN, Poggi, Marshall Cork, MD   triamterene-hydrochlorothiazide (MAXZIDE-25) 37.5-25  MG per tablet 1 tablet, 1 tablet, Oral, Daily, Poggi, Marshall Cork, MD, 1 tablet at 12/01/20 8921   valACYclovir (VALTREX) tablet 1,000 mg, 1,000 mg, Oral, Daily, Poggi, Marshall Cork, MD, 1,000 mg at 12/01/20 1941    ALLERGIES   Cytoxan [cyclophosphamide], Penicillin g, Taxotere [docetaxel], Other, Parabens, Shellac, Ciprofloxacin, Fluocinonide, Tape, and Triamcinolone     REVIEW OF SYSTEMS    Review of Systems:  Gen:  Denies  fever, sweats, chills weigh loss  HEENT: Denies blurred vision, double vision, ear pain, eye pain, hearing loss, nose bleeds, sore throat Cardiac:  No dizziness, chest pain or heaviness, chest tightness,edema Resp:   Denies cough or sputum porduction, shortness of breath,wheezing, hemoptysis,  Gi: Denies swallowing difficulty, stomach pain, nausea or vomiting, diarrhea, constipation, bowel incontinence Gu:  Denies bladder incontinence, burning urine Ext:   Denies Joint pain, stiffness or swelling Skin: Denies  skin rash, easy bruising or bleeding or hives Endoc:  Denies polyuria, polydipsia , polyphagia or weight change Psych:   Denies depression, insomnia or hallucinations   Other:  All other systems negative   VS: BP (!) 149/63 (BP Location: Left Arm)   Pulse 82   Temp 98.6 F (37 C)   Resp 16   Ht 5' 6.5" (1.689 m)   Wt 73.9 kg   SpO2 93%   BMI 25.91 kg/m      PHYSICAL EXAM    GENERAL:NAD, no fevers, chills, no weakness no fatigue HEAD: Normocephalic, atraumatic.  EYES: Pupils equal, round, reactive to light. Extraocular muscles intact. No scleral icterus.  MOUTH: Moist mucosal membrane. Dentition intact. No abscess noted.  EAR, NOSE, THROAT: Clear without exudates. No external lesions.  NECK: Supple. No thyromegaly. No nodules. No JVD.  PULMONARY: mild rhonchi bilaterally and mild wheezing.  CARDIOVASCULAR: S1 and S2. Regular rate and rhythm. No murmurs,  rubs, or gallops. No edema. Pedal pulses 2+ bilaterally.  GASTROINTESTINAL: Soft, nontender, nondistended. No masses. Positive bowel sounds. No hepatosplenomegaly.  MUSCULOSKELETAL: No swelling, clubbing, or edema. Range of motion full in all extremities.  NEUROLOGIC: Cranial nerves II through XII are intact. No gross focal neurological deficits. Sensation intact. Reflexes intact.  SKIN: No ulceration, lesions, rashes, or cyanosis. Skin warm and dry. Turgor intact.  PSYCHIATRIC: Mood, affect within normal limits. The patient is awake, alert and oriented x 3. Insight, judgment intact.       IMAGING    DG Chest 2 View  Result Date: 12/01/2020 CLINICAL DATA:  Cough, shortness of breath, pulmonary edema, recent shoulder arthroplasty EXAM: CHEST - 2 VIEW COMPARISON:  11/30/2020 FINDINGS: Unchanged examination with elevation of the right hemidiaphragm and bandlike scarring atelectasis. No acute appearing airspace opacity. Cardiomegaly. Status post right shoulder reverse arthroplasty. IMPRESSION: 1. Unchanged examination with elevation of the right hemidiaphragm and bandlike scarring or atelectasis. No acute appearing airspace opacity. 2.  Cardiomegaly. Electronically Signed   By: Eddie Candle M.D.   On: 12/01/2020 11:24   DG Chest Port 1 View  Result Date: 11/30/2020 CLINICAL DATA:  New cough, wheezing, recent RIGHT shoulder replacement, history childhood asthma EXAM: PORTABLE CHEST 1 VIEW COMPARISON:  Portable exam 0818 hours compared to 11/28/2020 FINDINGS: Enlargement of cardiac silhouette. Mediastinal contours and pulmonary vascularity normal. Persistent elevation of RIGHT diaphragm with RIGHT basilar atelectasis. Remaining lungs clear. No definite infiltrate, pleural effusion, or pneumothorax. Bones demineralized with note of a RIGHT shoulder prosthesis. IMPRESSION: Elevation of RIGHT diaphragm with RIGHT basilar atelectasis. Enlargement of cardiac silhouette. Electronically Signed   By: Elta Guadel[place]  Thornton Papas M.D.   On: 11/30/2020 11:56   DG Chest Port 1 View  Result Date: 11/28/2020 CLINICAL DATA:  Chest congestion EXAM: PORTABLE CHEST 1 VIEW COMPARISON:  08/13/2020 FINDINGS: Cardiac enlargement and aortic atherosclerosis. Chronic asymmetric elevation of the right hemidiaphragm with overlying subsegmental atelectasis versus scarring. No pleural effusion or edema. No airspace consolidation. Previous right shoulder arthroplasty. IMPRESSION: Chronic asymmetric elevation of the right hemidiaphragm with overlying subsegmental atelectasis versus scarring. Electronically Signed   By: Kerby Moors M.D.   On: 11/28/2020 06:10   DG Shoulder Right Port  Result Date: 11/27/2020 CLINICAL DATA:  Status post reverse right shoulder arthroplasty EXAM: PORTABLE RIGHT SHOULDER COMPARISON:  None. FINDINGS: Status post right shoulder reverse total arthroplasty with expected overlying postoperative change. No evidence of perihardware fracture or component malposition. IMPRESSION: Status post right shoulder reverse total arthroplasty with expected overlying postoperative change. No evidence of perihardware fracture or component malposition. Electronically Signed   By: Eddie Candle M.D.   On: 11/27/2020 10:49   [place] OR NERVE BLOCK-IMAGE ONLY [facility])  Result Date: 11/27/2020 There is no interpretation for this exam.  This order is for images obtained during a surgical procedure.  Please See "Surgeries" Tab for more information regarding the procedure.       ASSESSMENT/PLAN   Acute cough and labored breathing    - atelectasis bilaterally worse on right     - right hemidiaphragm dysfunction      -no consolidation /pneumonia at this moment    - stopped IVF at 75cc/h     - lasix 40 daily IV    - prednisone 40 daily PO    - Metaneb with Albuterol Q8h     -Reduce Atenelol to 12.5 BID   -DC morphine and dihenhydramine due to worsening of atelectasis, continue PO percoset for analgesia, may add tylenol and toradol if  patient needs more pain control     Left hemidiaphragm elevation   - Sniff fluoroscopy study on Monday to reveal if there is actual dysfunction of diaphragm  - supine and upright PFT to elucidate severity of defect.        Thank you for allowing me to participate in the care of this patient.  Total face to face encounter time for this patient visit was >15mn. >50% of the time was  spent in counseling and coordination of care.   Patient/Family are satisfied with care plan and all questions have been answered.  This document was prepared using Dragon voice recognition software and may include unintentional dictation errors.     FOttie Glazier M.D.  [place]

## 2020-12-02 ENCOUNTER — Inpatient Hospital Stay: Payer: Medicare Other

## 2020-12-02 LAB — BASIC METABOLIC PANEL
Anion gap: 13 (ref 5–15)
BUN: 32 mg/dL — ABNORMAL HIGH (ref 8–23)
CO2: 26 mmol/L (ref 22–32)
Calcium: 9.5 mg/dL (ref 8.9–10.3)
Chloride: 91 mmol/L — ABNORMAL LOW (ref 98–111)
Creatinine, Ser: 1.01 mg/dL — ABNORMAL HIGH (ref 0.44–1.00)
GFR, Estimated: 59 mL/min — ABNORMAL LOW (ref >60)
Glucose, Bld: 220 mg/dL — ABNORMAL HIGH (ref 70–99)
Potassium: 4 mmol/L (ref 3.5–5.1)
Sodium: 130 mmol/L — ABNORMAL LOW (ref 135–145)

## 2020-12-02 MED ORDER — METRONIDAZOLE 500 MG PO TABS
500.0000 mg | ORAL_TABLET | Freq: Two times a day (BID) | ORAL | Status: DC
  Administered 2020-12-02 – 2020-12-06 (×9): 500 mg via ORAL
  Filled 2020-12-02 (×9): qty 1

## 2020-12-02 MED ORDER — CLOTRIMAZOLE 1 % VA CREA
1.0000 | TOPICAL_CREAM | Freq: Every day | VAGINAL | Status: DC
  Administered 2020-12-02 – 2020-12-04 (×3): 1 via VAGINAL
  Filled 2020-12-02: qty 45

## 2020-12-02 MED ORDER — FUROSEMIDE 20 MG PO TABS
20.0000 mg | ORAL_TABLET | Freq: Every day | ORAL | Status: DC
  Administered 2020-12-02 – 2020-12-03 (×2): 20 mg via ORAL
  Filled 2020-12-02: qty 1

## 2020-12-02 MED ORDER — PREDNISONE 20 MG PO TABS
30.0000 mg | ORAL_TABLET | Freq: Every day | ORAL | Status: DC
  Administered 2020-12-03: 30 mg via ORAL
  Filled 2020-12-02: qty 1

## 2020-12-02 NOTE — Progress Notes (Signed)
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  PMH includes: HTN, anemia, skin CA, and Left breast CA.    PT Comments    Pt in recliner.  She is able to compete 3 gait trials in room with [facility] and overall improving gait from last week.  She is more confident with no knee buckling today.  She is able to go to bathroom to void and for mouth care during session.  Progressing well with mobility but SNF remains appropriate for discharge.  HR and O2 remains stable with mobility but SOB is noted once she returns to semi-supine position which has been typical the past few days.   Recommendations for follow up therapy are one component of a multi-disciplinary discharge planning process, led by the attending physician.  Recommendations may be updated based on patient status, additional functional criteria and insurance authorization.  Follow Up Recommendations  SNF;Supervision for mobility/OOB     Equipment Recommendations  None recommended by PT    Recommendations for Other Services       Precautions / Restrictions Precautions Precautions: Fall;Shoulder Shoulder Interventions: Shoulder sling/immobilizer;At all times;Off for dressing/bathing/exercises;Shoulder abduction pillow Restrictions Weight Bearing Restrictions: Yes RUE Weight Bearing: Non weight bearing     Mobility  Bed Mobility Overal bed mobility: Modified Independent                  Transfers Overall transfer level: Needs assistance Equipment used: None Transfers: Sit to/from Stand Sit to Stand: Supervision         General transfer comment: cues for hand placements as she pulls up to stand on [facility] and forgets to reach back when  sitting  Ambulation/Gait Ambulation/Gait assistance: Supervision;Min guard Gait Distance (Feet): 70 Feet Assistive device: Quad cane Gait Pattern/deviations: Step-to pattern     General Gait Details: multiple short walks in room for gait, toileting and ADL's   Stairs             Wheelchair Mobility    Modified Rankin (Stroke Patients Only)       Balance Overall balance assessment: Needs assistance Sitting-balance support: No upper extremity supported;Feet supported Sitting balance-Leahy Scale: Good     Standing balance support: Single extremity supported;During functional activity Standing balance-Leahy Scale: Fair                              Cognition Arousal/Alertness: Awake/alert Behavior During Therapy: WFL for tasks assessed/performed Overall Cognitive Status: Within Functional Limits for tasks assessed                                        Exercises      General Comments        Pertinent Vitals/Pain Pain Assessment: Faces Faces Pain Scale: Hurts a little bit Pain Location: R shoulder Pain Descriptors / Indicators: Sore;Aching Pain Intervention(s): Limited activity within patient's tolerance;Monitored during session;Repositioned;Ice applied    Home Living                      Prior Function            PT  Goals (current goals can now be found in the care plan section) Progress towards PT goals: Progressing toward goals    Frequency    7X/week      PT Plan Current plan remains appropriate    Co-evaluation              AM-PAC PT "6 Clicks" Mobility   Outcome Measure  Help needed turning from your back to your side while in a flat bed without using bedrails?: A Little Help needed moving from lying on your back to sitting on the side of a flat bed without using bedrails?: A Little Help needed moving to and from a bed to a chair (including a wheelchair)?: A Little Help needed standing up  from a chair using your arms (e.g., wheelchair or bedside chair)?: A Little Help needed to walk in hospital room?: A Little Help needed climbing 3-5 steps with a railing? : A Little 6 Click Score: 18    End of Session Equipment Utilized During Treatment: Gait belt Activity Tolerance: Patient tolerated treatment well;Patient limited by fatigue Patient left: in bed;with call bell/phone within reach;with bed alarm set Nurse Communication: Mobility status;Other (comment) PT Visit Diagnosis: Unsteadiness on feet (R26.81);Difficulty in walking, not elsewhere classified (R26.2);Muscle weakness (generalized) (M62.81)     Time: 9249-3241 PT Time Calculation (min) (ACUTE ONLY): 18 min  Charges:  $Gait Training: 8-22 mins                    Chesley Noon, PTA 12/02/20, 10:35 AM

## 2020-12-02 NOTE — Progress Notes (Signed)
Pulmonary Medicine          Date: 12/02/2020,   MRN# [id number] Mariah Hogan 10-20-1950     AdmissionWeight: 73.9 kg                 CurrentWeight: 73.9 kg   Referring provider: Morley Kos   CHIEF COMPLAINT:   Acute cough with labored breathing.    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  She has developed cough with labored breathing and has not been able to exert herself due to ongoing Cough.  She had CXR done and it shows platelike atelectasis as well as interstitial infiltrates. There is also elevation of right hemidiaphragm. We discussed findings and medical plan.  PCCM consult placed for additional evaluation and management for discharge planning.    12/01/20- Patient is improved, she is with less labored breathing.  She is on room air and is able to participate with Metaneb with albuterol.  She is able to take larger tidal volues on incentive spirometer. She diuresed well but urine output was not documented, patient shares it was vigorous. Her sodium levels are slightly lower due to diuresis and I have asked patient to use more salt with meals as she currently avoids salt intake PO. This is transient and will resolve.  Her sugars are slightly higher due to steroids and this also is temporary and does not require additional therapy. I reviewed her CXR with her today and there is much less edema but atelectasis persists on right and left hemidiaphragm still appears higher up then normal especially when compared to CT in June 2022.  We will repeat CT chest today with contrast to evalute for PE and other possible lesions of concern on differential.  In there interim plan is to continue to diurese and continue prednisone at current dose.  12/02/20- patient improved, she coughs better expectorating debris, she had CT  chest done which is + for endobronchial debris consistent with aspirated food as well as atelectasis.  Patient does have hiatal hernia and history of dysphagia s/p EGD with dilation.    PAST MEDICAL HISTORY   Past Medical History:  Diagnosis Date   Anemia    Arthritis    Asthma    H/O YEARS AGO-NOW HAS COUGHING SPELLS WHICH IS WHY SHE HAS HER ALBUTEROL INHALER   Basal cell carcinoma    R nose (MOHS)   Basal cell carcinoma 01/29/2016   Right forehead above med brow   Basal cell carcinoma 10/12/2019   L nasal tip - MOHS 05/15/2020   BRCA gene mutation negative 08/12/2017   Variant of unknown significance at [facility] only abnormality.   Breast cancer ([place]) 08/04/2017   1.8 cm ER 90%, PR 20%, HER-2/neu negative invasive mammary carcinoma of the left upper outer quadrant.  MammaPrint: High risk.   Cancer ([place])    [phone] basal cell carcinoma   Complication of anesthesia    HARD TIME WAKING UP   Dysplastic nevus 02/21/2010   Right mid pretibial, mild   Dysrhythmia    TACHYCARDIA   GERD (gastroesophageal reflux disease)    History of hiatal hernia    SMALL   Hyperlipidemia    Hypertension    PT STATES IN 01-18-18 PREOP PHONE INTERVIEW THAT HER BP HAS BEEN ELEVATED SINCE STARTING  ON VALSARTAN- PCP SWITCHED PT BACK TO MAXIDE ON 01-18-18 AND PT STATES THAT SHE HAS LOST 6-10 POUNDS OF FLUID SINCE RESTARTING MAXIDE.     Personal history of chemotherapy    PONV (postoperative nausea and vomiting)    Sjogren's syndrome ([place]) 07/2020     SURGICAL HISTORY   Past Surgical History:  Procedure Laterality Date   ABDOMINAL HYSTERECTOMY  1998   bladder tack  2002,2008   BREAST BIOPSY Left 1990   BREAST BIOPSY Left 2018   core bx- neg   BREAST BIOPSY Left 08/04/2017   left UOQ 2 oclock INVASIVE DUCTAL CARCINOMA.   BREAST BIOPSY Right 08/2017   MRI biopsy, FIBROCYSTIC CHANGE AND USUAL DUCTAL HYPERPLASIA,, clip placed   BREAST CYST EXCISION  x3   BREAST EXCISIONAL BIOPSY Bilateral     BREAST RECONSTRUCTION WITH PLACEMENT OF TISSUE EXPANDER AND FLEX HD (ACELLULAR HYDRATED DERMIS) Left 01/27/2018   Procedure: BREAST RECONSTRUCTION WITH PLACEMENT OF TISSUE EXPANDER AND FLEX HD (ACELLULAR HYDRATED DERMIS);  Surgeon: Wallace Going, DO;  Location: [facility];  Service: Plastics;  Laterality: Left;   BREAST SURGERY Left 02/13/14   Fibrocystic changes, pseudo-angiomatous stromal hyperplasia. No atypia or malignancy.   CARPAL TUNNEL RELEASE Bilateral x2   CHOLECYSTECTOMY  2008   COLONOSCOPY WITH PROPOFOL N/A 01/12/2015   Procedure: COLONOSCOPY WITH PROPOFOL;  Surgeon: Manya Silvas, MD;  Location: [facility]COPY;  Service: Endoscopy;  Laterality: N/A;   COLONOSCOPY WITH PROPOFOL N/A 02/06/2020   Procedure: COLONOSCOPY WITH PROPOFOL;  Surgeon: Lesly Rubenstein, MD;  Location: [facility] ENDOSCOPY;  Service: Endoscopy;  Laterality: N/A;   ESOPHAGOGASTRODUODENOSCOPY (EGD) WITH PROPOFOL N/A 01/12/2015   Procedure: ESOPHAGOGASTRODUODENOSCOPY (EGD) WITH PROPOFOL;  Surgeon: Manya Silvas, MD;  Location: [facility] ENDOSCOPY;  Service: Endoscopy;  Laterality: N/A;   ESOPHAGOGASTRODUODENOSCOPY (EGD) WITH PROPOFOL N/A 02/06/2020   Procedure: ESOPHAGOGASTRODUODENOSCOPY (EGD) WITH PROPOFOL;  Surgeon: Lesly Rubenstein, MD;  Location: [facility] ENDOSCOPY;  Service: Endoscopy;  Laterality: N/A;   LIPOSUCTION WITH LIPOFILLING Left 12/23/2018   Procedure: LIPOSUCTION WITH LIPOFILLING FROM ABDOMEN TO LEFT BREAST;  Surgeon: Wallace Going, DO;  Location: [place];  Service: Plastics;  Laterality: Left;  90 min, please   MA[facility]OMY Left 2019   [facility]   MASTECTOMY W/ SENTINEL NODE BIOPSY Left 01/27/2018   ypT1c ypN0; ER 90%, PR 20%, HER-2/neu not overexpressed.  MASTECTOMY WITH SENTINEL LYMPH NODE BIOPSY;  Surgeon: Robert Bellow, MD;  Location: [facility] OR[facility];  Service: General;  Laterality: Left;   MINOR BREAST BIOPSY Right 09/02/2017   Radiology perform procedure, fibrocystic changes  right retroareolar area.   NASAL RECONSTRUCTION  1983   OOPHORECTOMY     PORT-A-CATH REMOVAL     PORTACATH PLACEMENT N/A 10/21/2017   Procedure: INSERTION PORT-A-CATH;  Surgeon: Robert Bellow, MD;  Location: [facility] ORS;  Service: General;  Laterality: N/A;   REMOVAL OF TISSUE EXPANDER AND PLACEMENT OF IMPLANT Left 05/13/2018   Procedure: REMOVAL OF TISSUE EXPANDER AND PLACEMENT OF IMPLANT;  Surgeon: Wallace Going, DO;  Location: [facility];  Service: Plastics;  Laterality: Left;  total surgery time should be 3 hours, per provider   REVERSE SHOULDER ARTHROPLASTY Right 11/27/2020   Procedure: REVERSE SHOULDER ARTHROPLASTY;  Surgeon: Corky Mull, MD;  Location: [facility];  Service: Orthopedics;  Laterality: Right;   SAVORY DILATION N/A 01/12/2015   Procedure: SAVORY DILATION;  Surgeon: Manya Silvas, MD;  Location: [facility] ENDOSCOPY;  Service: Endoscopy;  Laterality: N/A;   TONSILLECTOMY AND ADENOIDECTOMY  1956     FAMILY HISTORY   Family History  Problem Relation Age of Onset   Breast cancer Sister 38       currently 71   Breast cancer Paternal Grandmother 21       bilateral; deceased 75s    Breast cancer Cousin 42       daughter of an unaffected maternal aunt   Breast cancer Cousin 9       kidney cancer also; daughter of an unaffected maternal aunt   Other Mother        TAH/BSO prior to menopause   Prostate cancer Paternal Grandfather        age at dx unknown   Breast cancer Maternal Aunt        age at dx unknown   Breast cancer Cousin 32       daughter of an unaffected maternal aunt     SOCIAL HISTORY   Social History   Tobacco Use   Smoking status: Never   Smokeless tobacco: Never  Vaping Use   Vaping Use: Never used  Substance Use Topics   Alcohol use: No    Alcohol/week: 0.0 standard drinks   Drug use: No     MEDICATIONS    Home Medication:  Current Outpatient Rx   Order #: [id number] Class: Normal   Order #: [id number] Class: Print   Order #:  [id number] Class: Print   Order #: [id number] Class: Normal    Current Medication:  Current Facility-Administered Medications:    acetaminophen (TYLENOL) tablet 325-650 mg, 325-650 mg, Oral, Q6H PRN, Poggi, Marshall Cork, MD, 500 mg at 11/27/20 1515   albuterol (PROVENTIL) (2.5 MG/3ML) 0.083% nebulizer solution 2.5 mg, 2.5 mg, Nebulization, Q6H PRN, Noralee Space, RPH, 2.5 mg at 12/01/20 2252   atenolol (TENORMIN) tablet 12.5 mg, 12.5 mg, Oral, BID, Lanney Gins, Lisa-Marie Rueger, MD, 12.5 mg at 12/02/20 0935   bisacodyl (DULCOLAX) suppository 10 mg, 10 mg, Rectal, Daily PRN, Poggi, Marshall Cork, MD   calcium carbonate (OS-CAL - dosed in mg of elemental calcium) tablet 500 mg of elemental calcium, 500 mg of elemental calcium, Oral, Q breakfast, Poggi, Marshall Cork, MD, 500 mg of elemental calcium at 12/02/20 0757   cholecalciferol (VITAMIN D3) tablet 2,000 Units, 2,000 Units, Oral, Daily, Poggi, Marshall Cork, MD, 2,000 Units at 12/02/20 0935   citalopram (CELEXA) tablet 20 mg, 20 mg, Oral, QHS, Poggi, Marshall Cork, MD, 20 mg at 12/01/20 2130   enoxaparin (LOVENOX) injection 40 mg, 40 mg, Subcutaneous, Q24H, Poggi, Marshall Cork, MD, 40 mg at 12/02/20 0825   furosemide (LASIX) injection 40 mg, 40 mg, Intravenous, Daily, Lanney Gins, Damaris Abeln, MD, 40 mg at 12/01/20 2130   gabapentin (NEURONTIN) capsule 300 mg, 300 mg, Oral, TID, Poggi, Marshall Cork, MD, 300 mg at 12/02/20 6295   HYDROcodone-acetaminophen (NORCO/VICODIN) 5-325 MG per tablet 1-2 tablet, 1-2 tablet, Oral, Q4H PRN, Poggi, Marshall Cork, MD, 1 tablet at 12/02/20 0757   magnesium hydroxide (MILK OF MAGNESIA) suspension 30 mL, 30 mL, Oral, Daily PRN, Poggi, Marshall Cork, MD, 30 mL at 11/29/20 1108   pantoprazole (PROTONIX) EC tablet 20 mg, 20 mg, Oral, Daily, Lanney Gins, Quenna Doepke, MD, 20 mg at 12/02/20 0944   predniSONE (DELTASONE) tablet 40 mg, 40 mg, Oral, Q breakfast, Lanney Gins, Angel Hobdy, MD, 40 mg at 12/02/20 0757   triamterene-hydrochlorothiazide (MAXZIDE-25) 37.5-25 MG per tablet 1 tablet, 1 tablet, Oral, Daily,  Poggi, Marshall Cork, MD, 1 tablet at 12/02/20 0935   valACYclovir (VALTREX) tablet 1,000 mg, 1,000 mg, Oral, Daily, Poggi,  Marshall Cork, MD, 1,000 mg at 12/02/20 0935    ALLERGIES   Cytoxan [cyclophosphamide], Penicillin g, Taxotere [docetaxel], Other, Parabens, Shellac, Ciprofloxacin, Fluocinonide, Tape, and Triamcinolone     REVIEW OF SYSTEMS    Review of Systems:  Gen:  Denies  fever, sweats, chills weigh loss  HEENT: Denies blurred vision, double vision, ear pain, eye pain, hearing loss, nose bleeds, sore throat Cardiac:  No dizziness, chest pain or heaviness, chest tightness,edema Resp:   Denies cough or sputum porduction, shortness of breath,wheezing, hemoptysis,  Gi: Denies swallowing difficulty, stomach pain, nausea or vomiting, diarrhea, constipation, bowel incontinence Gu:  Denies bladder incontinence, burning urine Ext:   Denies Joint pain, stiffness or swelling Skin: Denies  skin rash, easy bruising or bleeding or hives Endoc:  Denies polyuria, polydipsia , polyphagia or weight change Psych:   Denies depression, insomnia or hallucinations   Other:  All other systems negative   VS: BP (!) 176/66 (BP Location: Left Leg)   Pulse 67   Temp 98.2 F (36.8 C) (Oral)   Resp 17   Ht 5' 6.5" (1.689 m)   Wt 73.9 kg   SpO2 93%   BMI 25.91 kg/m      PHYSICAL EXAM    GENERAL:NAD, no fevers, chills, no weakness no fatigue HEAD: Normocephalic, atraumatic.  EYES: Pupils equal, round, reactive to light. Extraocular muscles intact. No scleral icterus.  MOUTH: Moist mucosal membrane. Dentition intact. No abscess noted.  EAR, NOSE, THROAT: Clear without exudates. No external lesions.  NECK: Supple. No thyromegaly. No nodules. No JVD.  PULMONARY: mild rhonchi bilaterally and mild wheezing.  CARDIOVASCULAR: S1 and S2. Regular rate and rhythm. No murmurs, rubs, or gallops. No edema. Pedal pulses 2+ bilaterally.  GASTROINTESTINAL: Soft, nontender, nondistended. No masses. Positive bowel  sounds. No hepatosplenomegaly.  MUSCULOSKELETAL: No swelling, clubbing, or edema. Range of motion full in all extremities.  NEUROLOGIC: Cranial nerves II through XII are intact. No gross focal neurological deficits. Sensation intact. Reflexes intact.  SKIN: No ulceration, lesions, rashes, or cyanosis. Skin warm and dry. Turgor intact.  PSYCHIATRIC: Mood, affect within normal limits. The patient is awake, alert and oriented x 3. Insight, judgment intact.       IMAGING    DG Chest 2 View  Result Date: 12/01/2020 CLINICAL DATA:  Cough, shortness of breath, pulmonary edema, recent shoulder arthroplasty EXAM: CHEST - 2 VIEW COMPARISON:  11/30/2020 FINDINGS: Unchanged examination with elevation of the right hemidiaphragm and bandlike scarring atelectasis. No acute appearing airspace opacity. Cardiomegaly. Status post right shoulder reverse arthroplasty. IMPRESSION: 1. Unchanged examination with elevation of the right hemidiaphragm and bandlike scarring or atelectasis. No acute appearing airspace opacity. 2.  Cardiomegaly. Electronically Signed   By: Eddie Candle M.D.   On: 12/01/2020 11:24   CT Angio Chest Pulmonary Embolism (PE) W or WO Contrast  Result Date: 12/01/2020 CLINICAL DATA:  PE suspected, high prob history of breast cancer in Sjogren's. EXAM: CT ANGIOGRAPHY CHEST WITH CONTRAST TECHNIQUE: Multidetector CT imaging of the chest was performed using the standard protocol during bolus administration of intravenous contrast. Multiplanar CT image reconstructions and MIPs were obtained to evaluate the vascular anatomy. CONTRAST:  47m OMNIPAQUE IOHEXOL 350 MG/ML SOLN COMPARISON:  None. FINDINGS: Cardiovascular: Evaluation is limited secondary to streak artifact and respiratory motion. Satisfactory opacification of the pulmonary arteries to the segmental level. No evidence of pulmonary embolism. Normal heart size. No pericardial effusion. Atherosclerotic calcifications of the aorta. Elevation of the  RIGHT hemidiaphragm. Mediastinum/Nodes: No axillary or  mediastinal adenopathy. Thyroid is unremarkable. Lungs/Pleura: Enhancing consolidative opacity of the RIGHT lower lobe, increased in comparison to prior and consistent with atelectasis. This involves approximately half of the RIGHT lower lobe. There is increased RIGHT middle lobe atelectasis. Scattered endobronchial debris and mild bronchial wall thickening. Upper Abdomen: Small hiatal hernia.  Status post cholecystectomy. Musculoskeletal: Status post LEFT mastectomy with reconstruction. Small foci of air noted in the RIGHT upper extremity soft tissues, likely intravenous and iatrogenic in etiology. Status post RIGHT shoulder arthroplasty. Review of the MIP images confirms the above findings. IMPRESSION: 1. No evidence of pulmonary embolism. 2. Scattered areas of endobronchial debris with increased RIGHT lower lobe and RIGHT middle lobe atelectasis. Findings could be infectious or secondary to aspiration given presence of small hiatal hernia. Aortic Atherosclerosis (ICD10-I70.0). Electronically Signed   By: Valentino Saxon M.D.   On: 12/01/2020 19:59   DG Chest Port 1 View  Result Date: 11/30/2020 CLINICAL DATA:  New cough, wheezing, recent RIGHT shoulder replacement, history childhood asthma EXAM: PORTABLE CHEST 1 VIEW COMPARISON:  Portable exam 0818 hours compared to 11/28/2020 FINDINGS: Enlargement of cardiac silhouette. Mediastinal contours and pulmonary vascularity normal. Persistent elevation of RIGHT diaphragm with RIGHT basilar atelectasis. Remaining lungs clear. No definite infiltrate, pleural effusion, or pneumothorax. Bones demineralized with note of a RIGHT shoulder prosthesis. IMPRESSION: Elevation of RIGHT diaphragm with RIGHT basilar atelectasis. Enlargement of cardiac silhouette. Electronically Signed   By: Lavonia Dana M.D.   On: 11/30/2020 11:56   DG Chest Port 1 View  Result Date: 11/28/2020 CLINICAL DATA:  Chest congestion EXAM:  PORTABLE CHEST 1 VIEW COMPARISON:  08/13/2020 FINDINGS: Cardiac enlargement and aortic atherosclerosis. Chronic asymmetric elevation of the right hemidiaphragm with overlying subsegmental atelectasis versus scarring. No pleural effusion or edema. No airspace consolidation. Previous right shoulder arthroplasty. IMPRESSION: Chronic asymmetric elevation of the right hemidiaphragm with overlying subsegmental atelectasis versus scarring. Electronically Signed   By: Kerby Moors M.D.   On: 11/28/2020 06:10   DG Shoulder Right Port  Result Date: 11/27/2020 CLINICAL DATA:  Status post reverse right shoulder arthroplasty EXAM: PORTABLE RIGHT SHOULDER COMPARISON:  None. FINDINGS: Status post right shoulder reverse total arthroplasty with expected overlying postoperative change. No evidence of perihardware fracture or component malposition. IMPRESSION: Status post right shoulder reverse total arthroplasty with expected overlying postoperative change. No evidence of perihardware fracture or component malposition. Electronically Signed   By: Eddie Candle M.D.   On: 11/27/2020 10:49   [place] OR NERVE BLOCK-IMAGE ONLY [facility])  Result Date: 11/27/2020 There is no interpretation for this exam.  This order is for images obtained during a surgical procedure.  Please See "Surgeries" Tab for more information regarding the procedure.          ASSESSMENT/PLAN   Acute cough and labored breathing    - atelectasis bilaterally worse on right- CT chest with debris in right lung consistent with aspiration which patient admits has been a recurrent issue     - right hemidiaphragm dysfunction      -no consolidation /pneumonia at this moment    - stopped IVF at 75cc/h     - lasix 40 daily IV- change to PO 63m lasix    - prednisone 40 daily PO>>reduce by 110mdaily     - Metaneb with Albuterol Q8h     -Reduce Atenelol to 12.5 BID   -DC morphine and dihenhydramine due to worsening of atelectasis, continue PO percoset for  analgesia, may add tylenol and toradol if patient needs more  pain control     hemidiaphragm elevation   - [place] fluoroscopy study on Monday to reveal if there is actual dysfunction of diaphragm  - supine and upright PFT to elucidate severity of defect.    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     - CT chest with endobronchial debris and RLL atelectasis consistent with aspiration     -patient is allergic to few different classes of antibiotics including PCN and Flouroquinolones , will start with Flagyl and can modify per pharmD recommendations     Thank you for allowing me to participate in the care of this patient.  Total face to face encounter time for this patient visit was >12mn. >50% of the time was  spent in counseling and coordination of care.   Patient/Family are satisfied with care plan and all questions have been answered.  This document was prepared using Dragon voice recognition software and may include unintentional dictation errors.     FOttie Glazier M.D.  [place]ision of [place]

## 2020-12-02 NOTE — TOC Progression Note (Signed)
Transition of Care [facility]) - Progression Note    Patient Details  Name: Mariah Hogan MRN: [id number] Date of Birth: Oct 07, 1950  Transition of [facility]) CM/SW [place], RN Phone Number: 12/02/2020, 11:01 AM  Clinical Narrative:     The plan remains for the patient to DC to [facility] when Medically stable, Pulmonology consulted yesterday.  Breathing treatments ordered, CT chest obtained.  Further testing has been ordered for Monday, The patient has had a BM, TOC to continue to Monitor for plan and needs       Expected Discharge Plan and Services                                                 Social Determinants of Health (SDOH) Interventions    Readmission Risk Interventions No flowsheet data found.

## 2020-12-02 NOTE — Progress Notes (Signed)
Subjective: 5 Days Post-Op Procedure(s) (LRB): REVERSE SHOULDER ARTHROPLASTY (Right) Patient reports pain as mild in the right shoulder this morning.  Patient reports that her breathing continues to improve slightly.  BP improved this AM. Pulmonology consulted yesterday.  Breathing treatments ordered, CT chest obtained.  Further testing has been ordered for Monday. Current plan is still for discharge to SNF when appropriate. Patient has had a BM. No recent fevers. No nausea/vomiting  Objective: Vital signs in last 24 hours: Temp:  [98 F (36.7 C)-98.6 F (37 C)] 98.2 F (36.8 C) (09/25 0749) Pulse Rate:  [65-86] 86 (09/25 0749) Resp:  [16-18] 17 (09/25 0749) BP: (120-162)/(63-92) 120/71 (09/25 0749) SpO2:  [92 %-99 %] 99 % (09/25 0749)  Intake/Output from previous day: No intake or output data in the 24 hours ending 12/02/20 0851   Intake/Output this shift: No intake/output data recorded.  Labs: Recent Labs    11/30/20 0516  HGB 13.2   Recent Labs    11/30/20 0516  WBC 8.0  RBC 4.20  HCT 39.0  PLT 197   Recent Labs    12/01/20 0957 12/02/20 0440  NA 131* 130*  K 3.8 4.0  CL 92* 91*  CO2 27 26  BUN 26* 32*  CREATININE 0.84 1.01*  GLUCOSE 185* 220*  CALCIUM 9.5 9.5   No results for input(s): LABPT, INR in the last 72 hours.  EXAM General - Patient is Alert, Appropriate, and Oriented Lungs:  Mild rhonchi however improved. Extremity - Neurologically intact Intact pulses distally Incision: dressing C/D/I No cellulitis present Compartment soft Sensation to light touch intact to the right arm. Dressing/Incision - clean, dry, no drainage Motor Function - intact, moving foot and toes well on exam.  Abdomen soft with normal bowel sounds intact.  Past Medical History:  Diagnosis Date   Anemia    Arthritis    Asthma    H/O YEARS AGO-NOW HAS COUGHING SPELLS WHICH IS WHY SHE HAS HER ALBUTEROL INHALER   Basal cell carcinoma    R nose ([facility])   Basal cell  carcinoma 01/29/2016   Right forehead above med brow   Basal cell carcinoma 10/12/2019   L nasal tip - [facility] 05/15/2020   BRCA gene mutation negative 08/12/2017   Variant of unknown significance at [facility] only abnormality.   Breast cancer ([place]) 08/04/2017   1.8 cm ER 90%, PR 20%, HER-2/neu negative invasive mammary carcinoma of the left upper outer quadrant.  MammaPrint: High risk.   Cancer ([place])    [phone] basal cell carcinoma   Complication of anesthesia    HARD TIME WAKING UP   Dysplastic nevus 02/21/2010   Right mid pretibial, mild   Dysrhythmia    TACHYCARDIA   GERD (gastroesophageal reflux disease)    History of hiatal hernia    SMALL   Hyperlipidemia    Hypertension    PT STATES IN 01-18-18 PREOP PHONE INTERVIEW THAT HER BP HAS BEEN ELEVATED SINCE STARTING ON VALSARTAN- PCP SWITCHED PT BACK TO [place] ON 01-18-18 AND PT STATES THAT SHE HAS LOST 6-10 POUNDS OF FLUID SINCE RESTARTING MAXIDE.     Personal history of chemotherapy    PONV (postoperative nausea and vomiting)    Sjogren's syndrome ([place]) 07/2020   Assessment/Plan: 5 Days Post-Op Procedure(s) (LRB): REVERSE SHOULDER ARTHROPLASTY (Right) Active Problems:   Status post reverse arthroplasty of shoulder, right  Estimated body mass index is 25.91 kg/m as calculated from the following:   Height as of this encounter: 5' 6.5" (1.689  m).   Weight as of this encounter: 73.9 kg. Advance diet Up with therapy D/C IV fluids when tolerating po intake.  Labs reviewed, Na 130 for diuresis.  Encouraged increased oral salt intake with diet. BP 120/71 this AM. CT chest ordered, Pulmonology following patient.  DG sniff test has been ordered for tomorrow.  Continue to appreciate input. Patient has had a BM. Plan for d/c to SNF, potentially on Monday given timing of testing vs. Tuesday.  DVT Prophylaxis - Lovenox and Foot Pumps Non-weightbearing to the right arm.  Mariah Aribella Vavra, PA-C [place]  [facility] 12/02/2020, 8:51 AM

## 2020-12-03 LAB — BASIC METABOLIC PANEL
Anion gap: 8 (ref 5–15)
BUN: 41 mg/dL — ABNORMAL HIGH (ref 8–23)
CO2: 31 mmol/L (ref 22–32)
Calcium: 9 mg/dL (ref 8.9–10.3)
Chloride: 92 mmol/L — ABNORMAL LOW (ref 98–111)
Creatinine, Ser: 1.03 mg/dL — ABNORMAL HIGH (ref 0.44–1.00)
GFR, Estimated: 58 mL/min — ABNORMAL LOW (ref >60)
Glucose, Bld: 200 mg/dL — ABNORMAL HIGH (ref 70–99)
Potassium: 4.1 mmol/L (ref 3.5–5.1)
Sodium: 131 mmol/L — ABNORMAL LOW (ref 135–145)

## 2020-12-03 LAB — HEPATITIS B CORE ANTIBODY, IGM: Hep B C IgM: NONREACTIVE

## 2020-12-03 LAB — HEPATITIS A ANTIBODY, IGM: Hep A IgM: NONREACTIVE

## 2020-12-03 LAB — HEPATITIS A ANTIBODY, TOTAL: hep A Total Ab: NONREACTIVE

## 2020-12-03 MED ORDER — POLYETHYLENE GLYCOL 3350 17 G PO PACK
17.0000 g | PACK | Freq: Every day | ORAL | Status: DC
  Administered 2020-12-03 – 2020-12-05 (×3): 17 g via ORAL
  Filled 2020-12-03 (×3): qty 1

## 2020-12-03 MED ORDER — PREDNISONE 20 MG PO TABS
20.0000 mg | ORAL_TABLET | Freq: Every day | ORAL | Status: DC
  Administered 2020-12-04: 20 mg via ORAL
  Filled 2020-12-03: qty 1

## 2020-12-03 MED ORDER — SENNA 8.6 MG PO TABS
1.0000 | ORAL_TABLET | Freq: Two times a day (BID) | ORAL | Status: DC
  Administered 2020-12-03 – 2020-12-06 (×7): 8.6 mg via ORAL
  Filled 2020-12-03 (×6): qty 1

## 2020-12-03 NOTE — Progress Notes (Signed)
Subjective: 6 Days Post-Op Procedure(s) (LRB): REVERSE SHOULDER ARTHROPLASTY (Right) Patient reports pain as mild in the right shoulder this morning.  Breathing has improved, Pulmonology was consulted.  Currently following. BP improved, elevated with activity. Current plan is for discharge to SNF when appropriate, undergoing further pulmonary testing today. Patient has had a BM.  No recent fevers, no N/V  Objective: Vital signs in last 24 hours: Temp:  [98.2 F (36.8 C)-98.9 F (37.2 C)] 98.2 F (36.8 C) (09/26 0436) Pulse Rate:  [66-68] 66 (09/26 0436) Resp:  [17] 17 (09/26 0436) BP: (129-177)/(64-98) 129/64 (09/26 0436) SpO2:  [93 %-95 %] 95 % (09/26 0436)  Intake/Output from previous day: No intake or output data in the 24 hours ending 12/03/20 0745   Intake/Output this shift: No intake/output data recorded.  Labs: No results for input(s): HGB in the last 72 hours.  No results for input(s): WBC, RBC, HCT, PLT in the last 72 hours.  Recent Labs    12/02/20 0440 12/03/20 0546  NA 130* 131*  K 4.0 4.1  CL 91* 92*  CO2 26 31  BUN 32* 41*  CREATININE 1.01* 1.03*  GLUCOSE 220* 200*  CALCIUM 9.5 9.0   No results for input(s): LABPT, INR in the last 72 hours.  EXAM General - Patient is Alert, Appropriate, and Oriented Lungs:  Mild rhonchi and mild wheeze noted. Extremity - Neurologically intact Intact pulses distally Incision: dressing C/D/I No cellulitis present Compartment soft Sensation to light touch intact to the right arm. Dressing/Incision - clean, dry, no drainage Motor Function - intact, moving foot and toes well on exam.  Abdomen soft with normal bowel sounds intact.  Past Medical History:  Diagnosis Date   Anemia    Arthritis    Asthma    H/O YEARS AGO-NOW HAS COUGHING SPELLS WHICH IS WHY SHE HAS HER ALBUTEROL INHALER   Basal cell carcinoma    R nose ([facility])   Basal cell carcinoma 01/29/2016   Right forehead above med brow   Basal cell  carcinoma 10/12/2019   L nasal tip - [facility] 05/15/2020   BRCA gene mutation negative 08/12/2017   Variant of unknown significance at [facility] only abnormality.   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  MammaPrint: High risk.   Cancer ([place])    [phone] basal cell carcinoma   Complication of anesthesia    HARD TIME WAKING UP   Dysplastic nevus 02/21/2010   Right mid pretibial, mild   Dysrhythmia    TACHYCARDIA   GERD (gastroesophageal reflux disease)    History of hiatal hernia    SMALL   Hyperlipidemia    Hypertension    PT STATES IN 01-18-18 PREOP PHONE INTERVIEW THAT HER BP HAS BEEN ELEVATED SINCE STARTING ON VALSARTAN- PCP SWITCHED PT BACK TO [place] ON 01-18-18 AND PT STATES THAT SHE HAS LOST 6-10 POUNDS OF FLUID SINCE RESTARTING MAXIDE.     Personal history of chemotherapy    PONV (postoperative nausea and vomiting)    Sjogren's syndrome ([place]) 07/2020   Assessment/Plan: 6 Days Post-Op Procedure(s) (LRB): REVERSE SHOULDER ARTHROPLASTY (Right) Active Problems:   Status post reverse arthroplasty of shoulder, right  Estimated body mass index is 25.91 kg/m as calculated from the following:   Height as of this encounter: 5' 6.5" (1.689 m).   Weight as of this encounter: 73.9 kg. Up with therapy   Labs reviewed this AM.  Na 131. CT chest demonstrated evidence for aspiration pneumonia.  Patient started on Flagyl per pulmonology. Hepatomegaly noted on CT scan.  Liver US has been ordered for the patient. Patient undergoing further testing today, including DG sniff test. BP 129/64, BB was adjusted given wheeze/cough. Patient has had a BM. Will continue to monitor patient, patient is doing well from a orthopaedic standpoint however will await results from her upcoming work-up scheduled for today.  DVT Prophylaxis - Lovenox and Foot Pumps Non-weightbearing to the right arm.  Raquel Cyree Chuong, PA-C [facility] 12/03/2020, 7:45 AM

## 2020-12-03 NOTE — Care Management Important Message (Signed)
Important Message  Patient Details  Name: Mariah Hogan MRN: [id number] Date of Birth: 12/31/1950   Medicare Important Message Given:  Yes     Loann Quill 12/03/2020, 12:57 PM

## 2020-12-03 NOTE — Progress Notes (Signed)
Pulmonary Medicine          Date: 12/03/2020,   MRN# [id number] Mariah Hogan 1951/03/08     AdmissionWeight: 73.9 kg                 CurrentWeight: 73.9 kg   Referring provider: Morley Kos   CHIEF COMPLAINT:   Acute cough with labored breathing.    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  She has developed cough with labored breathing and has not been able to exert herself due to ongoing Cough.  She had CXR done and it shows platelike atelectasis as well as interstitial infiltrates. There is also elevation of right hemidiaphragm. We discussed findings and medical plan.  PCCM consult placed for additional evaluation and management for discharge planning.    12/01/20- Patient is improved, she is with less labored breathing.  She is on room air and is able to participate with Metaneb with albuterol.  She is able to take larger tidal volues on incentive spirometer. She diuresed well but urine output was not documented, patient shares it was vigorous. Her sodium levels are slightly lower due to diuresis and I have asked patient to use more salt with meals as she currently avoids salt intake PO. This is transient and will resolve.  Her sugars are slightly higher due to steroids and this also is temporary and does not require additional therapy. I reviewed her CXR with her today and there is much less edema but atelectasis persists on right and left hemidiaphragm still appears higher up then normal especially when compared to CT in June 2022.  We will repeat CT chest today with contrast to evalute for PE and other possible lesions of concern on differential.  In there interim plan is to continue to diurese and continue prednisone at current dose.  12/02/20- patient improved, she coughs better expectorating debris, she had CT  chest done which is + for endobronchial debris consistent with aspirated food as well as atelectasis.  Patient does have hiatal hernia and history of dysphagia s/p EGD with dilation.    12/03/20- patient is doing well, she is not wheezing.  She had US liver - +NASH, infectious hepatitis ruled out. She continues to have some choking episodes post prandially , we discussed doing swallow evaluation once she is stronger in chronic stable state. She is on neurontin TID which may hinder her sensorium and she states she does not feel that its helping her so we will dc this. She has mild pre renal azotemia, lasix is stopped. Tapering prednisone today.   PAST MEDICAL HISTORY   Past Medical History:  Diagnosis Date   Anemia    Arthritis    Asthma    H/O YEARS AGO-NOW HAS COUGHING SPELLS WHICH IS WHY SHE HAS HER ALBUTEROL INHALER   Basal cell carcinoma    R nose ([facility])   Basal cell carcinoma 01/29/2016   Right forehead above med brow   Basal cell carcinoma 10/12/2019   L nasal tip - MO[facility] 05/15/2020   BRCA gene mutation negative 08/12/2017   Variant of unknown significance at [facility] only abnormality.   Breast cancer ([place]) 08/04/2017   1.8 cm ER 90%, PR 20%, HER-2/neu negative invasive mammary carcinoma of the left upper outer quadrant.  MammaPrint: High risk.  Cancer ([place])    [id number] basal cell carcinoma   Complication of anesthesia    HARD TIME WAKING UP   Dysplastic nevus 02/21/2010   Right mid pretibial, mild   Dysrhythmia    TACHYCARDIA   GERD (gastroesophageal reflux disease)    History of hiatal hernia    SMALL   Hyperlipidemia    Hypertension    PT STATES IN 01-18-18 PREOP PHONE INTERVIEW THAT HER BP HAS BEEN ELEVATED SINCE STARTING ON VALSARTAN- PCP SWITCHED PT BACK TO [place] ON 01-18-18 AND PT STATES THAT SHE HAS LOST 6-10 POUNDS OF FLUID SINCE RESTARTING MAXIDE.     Personal history of chemotherapy    PONV (postoperative nausea and vomiting)    Sjogren's syndrome ([place]) 07/2020      SURGICAL HISTORY   Past Surgical History:  Procedure Laterality Date   ABDOMINAL HYSTERECTOMY  1998   bladder tack  2002,2008   BREAST BIOPSY Left 1990   BREAST BIOPSY Left 2018   core bx- neg   BREAST BIOPSY Left 08/04/2017   left UOQ 2 oclock INVASIVE DUCTAL CARCINOMA.   BREAST BIOPSY Right 08/2017   MRI biopsy, FIBROCYSTIC CHANGE AND USUAL DUCTAL HYPERPLASIA,, clip placed   BREAST CYST EXCISION  x3   BREAST EXCISIONAL BIOPSY Bilateral    BREAST RECONSTRUCTION WITH PLACEMENT OF TISSUE EXPANDER AND FLEX HD (ACELLULAR HYDRATED DERMIS) Left 01/27/2018   Procedure: BREAST RECONSTRUCTION WITH PLACEMENT OF TISSUE EXPANDER AND FLEX HD (ACELLULAR HYDRATED DERMIS);  Surgeon: Wallace Going, DO;  Location: [facility];  Service: Plastics;  Laterality: Left;   BREAST SURGERY Left 02/13/14   Fibrocystic changes, pseudo-angiomatous stromal hyperplasia. No atypia or malignancy.   CARPAL TUNNEL RELEASE Bilateral x2   CHOLECYSTECTOMY  2008   COLONOSCOPY WITH PROPOFOL N/A 01/12/2015   Procedure: COLONOSCOPY WITH PROPOFOL;  Surgeon: Manya Silvas, MD;  Location: [facility];  Service: Endoscopy;  Laterality: N/A;   COLONOSCOPY WITH PROPOFOL N/A 02/06/2020   Procedure: COLONOSCOPY WITH PROPOFOL;  Surgeon: Lesly Rubenstein, MD;  Location: [facility] ENDOSCOPY;  Service: Endoscopy;  Laterality: N/A;   ESOPHAGOGASTRODUODENOSCOPY (EGD) WITH PROPOFOL N/A 01/12/2015   Procedure: ESOPHAGOGASTRODUODENOSCOPY (EGD) WITH PROPOFOL;  Surgeon: Manya Silvas, MD;  Location: [facility] ENDOSCOPY;  Service: Endoscopy;  Laterality: N/A;   ESOPHAGOGASTRODUODENOSCOPY (EGD) WITH PROPOFOL N/A 02/06/2020   Procedure: ESOPHAGOGASTRODUODENOSCOPY (EGD) WITH PROPOFOL;  Surgeon: Lesly Rubenstein, MD;  Location: [facility] ENDOSCOPY;  Service: Endoscopy;  Laterality: N/A;   LIPOSUCTION WITH LIPOFILLING Left 12/23/2018   Procedure: LIPOSUCTION WITH LIPOFILLING FROM ABDOMEN TO LEFT BREAST;  Surgeon: Wallace Going, DO;   Location: [place];  Service: Plastics;  Laterality: Left;  90 min, please   MASTECTOMY Left 2019   [facility]   MASTECTOMY W/ SENTINEL NODE BIOPSY Left 01/27/2018   ypT1c ypN0; ER 90%, PR 20%, HER-2/neu not overexpressed.  MASTECTOMY WITH SENTINEL LYMPH NODE BIOPSY;  Surgeon: Robert Bellow, MD;  Location: [facility];  Service: General;  Laterality: Left;   MINOR BREAST BIOPSY Right 09/02/2017   Radiology perform procedure, fibrocystic changes right retroareolar area.   NASAL RECONSTRUCTION  1983   OOPHORECTOMY     PORT-A-CATH REMOVAL     PORTACATH PLACEMENT N/A 10/21/2017   Procedure: INSERTION PORT-A-CATH;  Surgeon: Robert Bellow, MD;  Location: [facility] OR[facility];  Service: General;  Laterality: N/A;   REMOVAL OF TISSUE EXPANDER AND PLACEMENT OF IMPLANT Left 05/13/2018   Procedure: REMOVAL OF TISSUE EXPANDER AND PLACEMENT OF IMPLANT;  Surgeon: Wallace Going, DO;  Location: [facility];  Service: Plastics;  Laterality: Left;  total surgery time should be 3 hours, per provider   REVERSE SHOULDER ARTHROPLASTY Right 11/27/2020   Procedure: REVERSE SHOULDER ARTHROPLASTY;  Surgeon: Corky Mull, MD;  Location: [facility];  Service: Orthopedics;  Laterality: Right;   SAVORY DILATION N/A 01/12/2015   Procedure: SAVORY DILATION;  Surgeon: Manya Silvas, MD;  Location: [facility] ENDOSCOPY;  Service: Endoscopy;  Laterality: N/A;   TONSILLECTOMY AND ADENOIDECTOMY  1956     FAMILY HISTORY   Family History  Problem Relation Age of Onset   Breast cancer Sister 70       currently 11   Breast cancer Paternal Grandmother 53       bilateral; deceased 87s    Breast cancer Cousin 38       daughter of an unaffected maternal aunt   Breast cancer Cousin 70       kidney cancer also; daughter of an unaffected maternal aunt   Other Mother        TAH/BSO prior to menopause   Prostate cancer Paternal Grandfather        age at dx unknown   Breast cancer Maternal Aunt        age at dx unknown    Breast cancer Cousin 58       daughter of an unaffected maternal aunt     SOCIAL HISTORY   Social History   Tobacco Use   Smoking status: Never   Smokeless tobacco: Never  Vaping Use   Vaping Use: Never used  Substance Use Topics   Alcohol use: No    Alcohol/week: 0.0 standard drinks   Drug use: No     MEDICATIONS    Home Medication:  Current Outpatient Rx   Order #: [id number] Class: Normal   Order #: [id number] Class: Print   Order #: [id number] Class: Print   Order #: [id number] Class: Normal    Current Medication:  Current Facility-Administered Medications:    acetaminophen (TYLENOL) tablet 325-650 mg, 325-650 mg, Oral, Q6H PRN, Poggi, Marshall Cork, MD, 500 mg at 11/27/20 1515   albuterol (PROVENTIL) (2.5 MG/3ML) 0.083% nebulizer solution 2.5 mg, 2.5 mg, Nebulization, Q6H PRN, Noralee Space, RPH, 2.5 mg at 12/02/20 2332   atenolol (TENORMIN) tablet 12.5 mg, 12.5 mg, Oral, BID, Lanney Gins, Hena Ewalt, MD, 12.5 mg at 12/03/20 1046   bisacodyl (DULCOLAX) suppository 10 mg, 10 mg, Rectal, Daily PRN, Poggi, Marshall Cork, MD   calcium carbonate (OS-CAL - dosed in mg of elemental calcium) tablet 500 mg of elemental calcium, 500 mg of elemental calcium, Oral, Q breakfast, Poggi, Marshall Cork, MD, 500 mg of elemental calcium at 12/03/20 4650   cholecalciferol (VITAMIN D3) tablet 2,000 Units, 2,000 Units, Oral, Daily, Poggi, Marshall Cork, MD, 2,000 Units at 12/03/20 1047   citalopram (CELEXA) tablet 20 mg, 20 mg, Oral, QHS, Poggi, Marshall Cork, MD, 20 mg at 12/02/20 2222   clotrimazole (GYNE-LOTRIMIN) vaginal cream 1 Applicatorful, 1 Applicatorful, Vaginal, QHS, Madasyn Heath, MD, 1 Applicatorful at 35/46/56 2226   enoxaparin (LOVENOX) injection 40 mg, 40 mg, Subcutaneous, Q24H, Poggi, Marshall Cork, MD, 40 mg at 12/03/20 8127   gabapentin (NEURONTIN) capsule 300 mg, 300 mg, Oral, TID, Poggi, Marshall Cork, MD, 300 mg at 12/03/20 1046   HYDROcodone-acetaminophen (NORCO/VICODIN) 5-325 MG per tablet 1-2 tablet, 1-2 tablet, Oral,  Q4H PRN, Poggi, Marshall Cork, MD, 1 tablet at 12/03/20 1047   magnesium hydroxide (MILK OF MAGNESIA) suspension 30 mL, 30 mL, Oral, Daily PRN, Poggi, John  J, MD, 30 mL at 12/02/20 1820   metroNIDAZOLE (FLAGYL) tablet 500 mg, 500 mg, Oral, Q12H, Lanney Gins, Jaymes Hang, MD, 500 mg at 12/03/20 1046   pantoprazole (PROTONIX) EC tablet 20 mg, 20 mg, Oral, Daily, Lanney Gins, Chioke Noxon, MD, 20 mg at 12/03/20 3546   polyethylene glycol (MIRALAX / GLYCOLAX) packet 17 g, 17 g, Oral, Daily, Sreenath, Sudheer B, MD   predniSONE (DELTASONE) tablet 30 mg, 30 mg, Oral, Q breakfast, Ottie Glazier, MD, 30 mg at 12/03/20 5681   senna (SENOKOT) tablet 8.6 mg, 1 tablet, Oral, BID, Sreenath, Sudheer B, MD, 8.6 mg at 12/03/20 1239   triamterene-hydrochlorothiazide (MAXZIDE-25) 37.5-25 MG per tablet 1 tablet, 1 tablet, Oral, Daily, Poggi, Marshall Cork, MD, 1 tablet at 12/03/20 1046   valACYclovir (VALTREX) tablet 1,000 mg, 1,000 mg, Oral, Daily, Poggi, Marshall Cork, MD, 1,000 mg at 12/03/20 1046    ALLERGIES   Cytoxan [cyclophosphamide], Penicillin g, Taxotere [docetaxel], Other, Parabens, Shellac, Ciprofloxacin, Fluocinonide, Tape, and Triamcinolone     REVIEW OF SYSTEMS    Review of Systems:  Gen:  Denies  fever, sweats, chills weigh loss  HEENT: Denies blurred vision, double vision, ear pain, eye pain, hearing loss, nose bleeds, sore throat Cardiac:  No dizziness, chest pain or heaviness, chest tightness,edema Resp:   Denies cough or sputum porduction, shortness of breath,wheezing, hemoptysis,  Gi: Denies swallowing difficulty, stomach pain, nausea or vomiting, diarrhea, constipation, bowel incontinence Gu:  Denies bladder incontinence, burning urine Ext:   Denies Joint pain, stiffness or swelling Skin: Denies  skin rash, easy bruising or bleeding or hives Endoc:  Denies polyuria, polydipsia , polyphagia or weight change Psych:   Denies depression, insomnia or hallucinations   Other:  All other systems negative   VS: BP (!)  144/77 (BP Location: Left Leg)   Pulse 68   Temp 98.3 F (36.8 C) (Oral)   Resp 20   Ht 5' 6.5" (1.689 m)   Wt 73.9 kg   SpO2 95%   BMI 25.91 kg/m      PHYSICAL EXAM    GENERAL:NAD, no fevers, chills, no weakness no fatigue HEAD: Normocephalic, atraumatic.  EYES: Pupils equal, round, reactive to light. Extraocular muscles intact. No scleral icterus.  MOUTH: Moist mucosal membrane. Dentition intact. No abscess noted.  EAR, NOSE, THROAT: Clear without exudates. No external lesions.  NECK: Supple. No thyromegaly. No nodules. No JVD.  PULMONARY: mild rhonchi bilaterally and mild wheezing.  CARDIOVASCULAR: S1 and S2. Regular rate and rhythm. No murmurs, rubs, or gallops. No edema. Pedal pulses 2+ bilaterally.  GASTROINTESTINAL: Soft, nontender, nondistended. No masses. Positive bowel sounds. No hepatosplenomegaly.  MUSCULOSKELETAL: No swelling, clubbing, or edema. Range of motion full in all extremities.  NEUROLOGIC: Cranial nerves II through XII are intact. No gross focal neurological deficits. Sensation intact. Reflexes intact.  SKIN: No ulceration, lesions, rashes, or cyanosis. Skin warm and dry. Turgor intact.  PSYCHIATRIC: Mood, affect within normal limits. The patient is awake, alert and oriented x 3. Insight, judgment intact.       IMAGING    DG Chest 2 View  Result Date: 12/01/2020 CLINICAL DATA:  Cough, shortness of breath, pulmonary edema, recent shoulder arthroplasty EXAM: CHEST - 2 VIEW COMPARISON:  11/30/2020 FINDINGS: Unchanged examination with elevation of the right hemidiaphragm and bandlike scarring atelectasis. No acute appearing airspace opacity. Cardiomegaly. Status post right shoulder reverse arthroplasty. IMPRESSION: 1. Unchanged examination with elevation of the right hemidiaphragm and bandlike scarring or atelectasis. No acute appearing airspace opacity. 2.  Cardiomegaly. Electronically  Signed   By: Eddie Candle M.D.   On: 12/01/2020 11:24   CT Angio Chest  Pulmonary Embolism (PE) W or WO Contrast  Result Date: 12/01/2020 CLINICAL DATA:  PE suspected, high prob history of breast cancer in Sjogren's. EXAM: CT ANGIOGRAPHY CHEST WITH CONTRAST TECHNIQUE: Multidetector CT imaging of the chest was performed using the standard protocol during bolus administration of intravenous contrast. Multiplanar CT image reconstructions and MIPs were obtained to evaluate the vascular anatomy. CONTRAST:  32m OMNIPAQUE IOHEXOL 350 MG/ML SOLN COMPARISON:  None. FINDINGS: Cardiovascular: Evaluation is limited secondary to streak artifact and respiratory motion. Satisfactory opacification of the pulmonary arteries to the segmental level. No evidence of pulmonary embolism. Normal heart size. No pericardial effusion. Atherosclerotic calcifications of the aorta. Elevation of the RIGHT hemidiaphragm. Mediastinum/Nodes: No axillary or mediastinal adenopathy. Thyroid is unremarkable. Lungs/Pleura: Enhancing consolidative opacity of the RIGHT lower lobe, increased in comparison to prior and consistent with atelectasis. This involves approximately half of the RIGHT lower lobe. There is increased RIGHT middle lobe atelectasis. Scattered endobronchial debris and mild bronchial wall thickening. Upper Abdomen: Small hiatal hernia.  Status post cholecystectomy. Musculoskeletal: Status post LEFT mastectomy with reconstruction. Small foci of air noted in the RIGHT upper extremity soft tissues, likely intravenous and iatrogenic in etiology. Status post RIGHT shoulder arthroplasty. Review of the MIP images confirms the above findings. IMPRESSION: 1. No evidence of pulmonary embolism. 2. Scattered areas of endobronchial debris with increased RIGHT lower lobe and RIGHT middle lobe atelectasis. Findings could be infectious or secondary to aspiration given presence of small hiatal hernia. Aortic Atherosclerosis (ICD10-I70.0). Electronically Signed   By: SValentino SaxonM.D.   On: 12/01/2020 19:59   DG  Chest Port 1 View  Result Date: 11/30/2020 CLINICAL DATA:  New cough, wheezing, recent RIGHT shoulder replacement, history childhood asthma EXAM: PORTABLE CHEST 1 VIEW COMPARISON:  Portable exam 0818 hours compared to 11/28/2020 FINDINGS: Enlargement of cardiac silhouette. Mediastinal contours and pulmonary vascularity normal. Persistent elevation of RIGHT diaphragm with RIGHT basilar atelectasis. Remaining lungs clear. No definite infiltrate, pleural effusion, or pneumothorax. Bones demineralized with note of a RIGHT shoulder prosthesis. IMPRESSION: Elevation of RIGHT diaphragm with RIGHT basilar atelectasis. Enlargement of cardiac silhouette. Electronically Signed   By: MLavonia DanaM.D.   On: 11/30/2020 11:56   DG Chest Port 1 View  Result Date: 11/28/2020 CLINICAL DATA:  Chest congestion EXAM: PORTABLE CHEST 1 VIEW COMPARISON:  08/13/2020 FINDINGS: Cardiac enlargement and aortic atherosclerosis. Chronic asymmetric elevation of the right hemidiaphragm with overlying subsegmental atelectasis versus scarring. No pleural effusion or edema. No airspace consolidation. Previous right shoulder arthroplasty. IMPRESSION: Chronic asymmetric elevation of the right hemidiaphragm with overlying subsegmental atelectasis versus scarring. Electronically Signed   By: TKerby MoorsM.D.   On: 11/28/2020 06:10   DG Shoulder Right Port  Result Date: 11/27/2020 CLINICAL DATA:  Status post reverse right shoulder arthroplasty EXAM: PORTABLE RIGHT SHOULDER COMPARISON:  None. FINDINGS: Status post right shoulder reverse total arthroplasty with expected overlying postoperative change. No evidence of perihardware fracture or component malposition. IMPRESSION: Status post right shoulder reverse total arthroplasty with expected overlying postoperative change. No evidence of perihardware fracture or component malposition. Electronically Signed   By: AEddie CandleM.D.   On: 11/27/2020 10:49   [place] NERVE BLOCK-IMAGE ONLY  ([facility]  Result Date: 11/27/2020 There is no interpretation for this exam.  This order is for images obtained during a surgical procedure.  Please See "Surgeries" Tab for more information regarding the procedure.  US LIVER DOPPLER  Result Date: 12/02/2020 CLINICAL DATA:  History of NASH.  Evaluate hepatic vasculature. EXAM: DUPLEX ULTRASOUND OF LIVER TECHNIQUE: Color and duplex Doppler ultrasound was performed to evaluate the hepatic in-flow and out-flow vessels. COMPARISON:  Chest CT-12/01/2020 FINDINGS: Liver: There is diffuse increased slightly coarsened echogenicity of the hepatic parenchyma suggestive of hepatic steatosis. No discrete lesions are identified. No intrahepatic biliary ductal dilatation. Spleen: Normal in size measuring 9 11.1 x 9.7 x 4.4 cm in diameter (calculated volume of 244 cc) Portal Vein Velocities (normal hepatopetal directional flow) Main:  40 cm/sec Right:  14 cm/sec Left:  12 cm/sec Hepatic Vein Velocities (normal hepatofugal directional flow) Right:  20 cm/sec Middle:  21 cm/sec Left:  23 cm/sec Hepatic Artery Velocity:  86/21 cm/sec Splenic Vein Velocity:  19 cm/sec Varices: None visualized Ascites: None visualized IMPRESSION: 1. Findings suggestive of [place] hepatic steatosis. No discrete hepatic lesions are identified though further evaluation with abdominal MRI could be performed as indicated. 2. Patent hepatic vasculature with normal directional flow. 3. No evidence of portal venous hypertension. Specifically, no evidence of splenomegaly or ascites. Electronically Signed   By: Sandi Mariscal M.D.   On: 12/02/2020 14:16          ASSESSMENT/PLAN   Acute cough and labored breathing- RESOLVED     - atelectasis bilaterally worse on right- CT chest with debris in right lung consistent with aspiration which patient admits has been a recurrent issue     - right hemidiaphragm dysfunction      -no consolidation /pneumonia at this moment    - stopped IVF at 75cc/h     - lasix  40 daily IV- change to PO 77m lasix    - prednisone 40 daily PO>>reduce by 169mdaily     - Metaneb with Albuterol Q8h     -Reduce Atenelol to 12.5 BID   -DC morphine and dihenhydramine due to worsening of atelectasis, continue PO percoset for analgesia, may add tylenol and toradol if patient needs more pain control   -DC gabapenting   - tapering prednisone  hemidiaphragm elevation   - Sniff fluoroscopy study on Monday to reveal if there is actual dysfunction of diaphragm  - supine and upright PFT to elucidate severity of defect.    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     - CT chest with endobronchial debris and RLL atelectasis consistent with aspiration     -patient is allergic to few different classes of antibiotics including PCN and Flouroquinolones , will start with Flagyl and can modify per pharmD recommendations     Thank you for allowing me to participate in the care of this patient.   Patient/Family are satisfied with care plan and all questions have been answered.  This document was prepared using Dragon voice recognition software and may include unintentional dictation errors.     FuOttie GlazierM.D.  Division of [facility]

## 2020-12-03 NOTE — Progress Notes (Signed)
Patient alert and oriented x 4, complains of pain to right shoulder relieved with prn pain medication. Splint remains in place, polar care attached. Patient has questions for provider about current blood pressure medications. No adverse events, will continue to monitor.

## 2020-12-03 NOTE — Progress Notes (Signed)
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  PMH includes: HTN, anemia, skin CA, and Left breast CA.   OT comments  Pt seen for OT tx this morning for bathing, grooming, and dressing. Pt eager to participate. Pt instructed in adaptive strategies and hemi techniques for ADL with pt able to return demo with assist. Pt required Min A for UB bathing, MOD A for compression stocking mgt, CGA in standing to complete LB dressing over hips all with intermittent VC for R shoulder precautions for ROM while out of the sling for bathing/dressing. Pt instructed in sling and polar care positioning for optimal alignment and comfort. Pt verbalized understanding. She is progressing towards her goals, however continues to require significant assist for ADL 2/2 impairments. Continue to recommend SNF at this time.    Recommendations for follow up therapy are one component of a multi-disciplinary discharge planning process, led by the attending physician.  Recommendations may be updated based on patient status, additional functional criteria and insurance authorization.    Follow Up Recommendations  SNF    Equipment Recommendations  3 in 1 bedside commode    Recommendations for Other Services      Precautions / Restrictions Precautions Precautions: Fall;Shoulder Shoulder Interventions: Shoulder sling/immobilizer;At all times;Off for dressing/bathing/exercises;Shoulder abduction pillow Precaution Comments: required mod-max verbal cues to maintain shoulder precautions during bathing/dressing tasks Restrictions Weight Bearing Restrictions: No RUE Weight Bearing: Non weight bearing       Mobility Bed Mobility               General  bed mobility comments: in chair with nursing students upon arrival    Transfers Overall transfer level: Needs assistance Equipment used: None Transfers: Sit to/from Stand Sit to Stand: Supervision;Min guard              Balance Overall balance assessment: Needs assistance Sitting-balance support: No upper extremity supported;Feet supported Sitting balance-Leahy Scale: Good     Standing balance support: No upper extremity supported Standing balance-Leahy Scale: Fair                             ADL either performed or assessed with clinical judgement   ADL Overall ADL's : Needs assistance/impaired         Upper Body Bathing: Sitting;Minimal assistance;Set up Upper Body Bathing Details (indicate cue type and reason): Min A to wash LUE and assist to hold RUE in place to minimize shoulder movement while pt washed R underarm; otherwise was set up/supervision Lower Body Bathing: Sit to/from stand;Min guard Lower Body Bathing Details (indicate cue type and reason): CGA in standing to perform peri hygiene, otherwise able to wash up from seated position with set up and supervision. Upper Body Dressing : Sitting;Moderate assistance Upper Body Dressing Details (indicate cue type and reason): pt instructed in hemi techniques for dressing, requiring MOD A to thread RUE through sleeve Lower Body Dressing: Moderate assistance;Min guard;Sit to/from stand Lower Body Dressing Details (indicate cue type and reason): Pt managed to place pad in mesh briefs with modified independence and initiate donning over feet with supervision from seated position, CGA to complete donning over hips in standing; MOD A for ted hose  Vision       Perception     Praxis      Cognition Arousal/Alertness: Awake/alert Behavior During Therapy: WFL for tasks assessed/performed Overall Cognitive Status: Within Functional Limits for tasks assessed                                           Exercises     Shoulder Instructions       General Comments      Pertinent Vitals/ Pain       Pain Assessment: Faces Faces Pain Scale: Hurts a little bit Pain Location: R shoulder Pain Descriptors / Indicators: Sore;Aching;Grimacing Pain Intervention(s): Limited activity within patient's tolerance;Monitored during session;Premedicated before session;Repositioned;Ice applied  Home Living                                          Prior Functioning/Environment              Frequency  Min 1X/week        Progress Toward Goals  OT Goals(current goals can now be found in the care plan section)  Progress towards OT goals: Progressing toward goals  Acute Rehab OT Goals Patient Stated Goal: To be able to cook and care for myself OT Goal Formulation: With patient Time For Goal Achievement: 12/12/20 Potential to Achieve Goals: Good  Plan Discharge plan remains appropriate;Frequency remains appropriate    Co-evaluation                 AM-PAC OT "6 Clicks" Daily Activity     Outcome Measure   Help from another person eating meals?: None Help from another person taking care of personal grooming?: A Little Help from another person toileting, which includes using toliet, bedpan, or urinal?: A Little Help from another person bathing (including washing, rinsing, drying)?: A Lot Help from another person to put on and taking off regular upper body clothing?: A Lot Help from another person to put on and taking off regular lower body clothing?: A Little 6 Click Score: 17    End of Session    OT Visit Diagnosis: Other abnormalities of gait and mobility (R26.89);Muscle weakness (generalized) (M62.81);Pain Pain - Right/Left: Right Pain - part of body: Shoulder;Arm   Activity Tolerance Patient tolerated treatment well   Patient Left in chair;with call bell/phone within reach;with chair alarm set;Other (comment) (polar  care and sling in place)   Nurse Communication          Time: 8315-1761 OT Time Calculation (min): 32 min  Charges: OT General Charges $OT Visit: 1 Visit OT Treatments $Self Care/Home Management : 23-37 mins  Ardeth Perfect., MPH, MS, OTR/L ascom [phone] 12/03/20, 1:23 PM

## 2020-12-03 NOTE — Progress Notes (Signed)
Metaneb tx given with albuterol. Patient tolerated tx well. Immediately after tx patient went into a coughing spell after drinking water due to mouth dryness.  HR 85 RR 20 Spo2 noted at 95-96 on room air. BBS noted to be clear to diminished at bases. Patient started coughing up thin clear sputum and stated she felt like something was stuck in her throat. No airway or lung wheezes noted. Notified charge RN-Kara of the event as patient's RN was with another patient. Coughing eventually lessened prior to my departure.

## 2020-12-03 NOTE — Progress Notes (Signed)
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  PMH includes: HTN, anemia, skin CA, and Left breast CA.    PT Comments    Pt up in recliner upon arrival with nursing students.  She is able to stand and walk 84' out into hallway today.  Coughing continues at times and pt stops gait to recover.  She remains in chair with students for bathing and to await multiple tests today.   Recommendations for follow up therapy are one component of a multi-disciplinary discharge planning process, led by the attending physician.  Recommendations may be updated based on patient status, additional functional criteria and insurance authorization.  Follow Up Recommendations  SNF;Supervision for mobility/OOB     Equipment Recommendations  None recommended by PT    Recommendations for Other Services       Precautions / Restrictions Precautions Precautions: Fall;Shoulder Shoulder Interventions: Shoulder sling/immobilizer;At all times;Off for dressing/bathing/exercises;Shoulder abduction pillow Restrictions Weight Bearing Restrictions: No RUE Weight Bearing: Non weight bearing     Mobility  Bed Mobility               General bed mobility comments: in chair with nursing students upon arrival    Transfers Overall transfer level: Needs assistance Equipment used: None Transfers: Sit to/from Stand Sit to Stand: Supervision            Ambulation/Gait Ambulation/Gait assistance: Min guard Gait Distance (Feet): 70 Feet Assistive device: Quad cane Gait Pattern/deviations: Step-to pattern     General Gait Details: able to progress gait outside room to end of her short hallway and back   Stairs              Wheelchair Mobility    Modified Rankin (Stroke Patients Only)       Balance Overall balance assessment: Needs assistance Sitting-balance support: No upper extremity supported;Feet supported Sitting balance-Leahy Scale: Good     Standing balance support: Single extremity supported;During functional activity Standing balance-Leahy Scale: Fair                              Cognition Arousal/Alertness: Awake/alert Behavior During Therapy: WFL for tasks assessed/performed Overall Cognitive Status: Within Functional Limits for tasks assessed                                        Exercises      General Comments        Pertinent Vitals/Pain Pain Assessment: Faces Faces Pain Scale: Hurts a little bit Pain Location: R shoulder Pain Descriptors / Indicators: Sore;Aching Pain Intervention(s): Limited activity within patient's tolerance;Monitored during session;Premedicated before session    Home Living                      Prior Function            PT Goals (current goals can now be found in the care plan section) Progress towards PT goals: Progressing toward goals    Frequency    7X/week      PT Plan Current plan remains appropriate    Co-evaluation  AM-PAC PT "6 Clicks" Mobility   Outcome Measure  Help needed turning from your back to your side while in a flat bed without using bedrails?: A Little Help needed moving from lying on your back to sitting on the side of a flat bed without using bedrails?: A Little Help needed moving to and from a bed to a chair (including a wheelchair)?: A Little Help needed standing up from a chair using your arms (e.g., wheelchair or bedside chair)?: A Little Help needed to walk in hospital room?: A Little Help needed climbing 3-5 steps with a railing? : A Little 6 Click Score: 18    End of Session Equipment Utilized During Treatment: Gait belt Activity Tolerance:  Patient tolerated treatment well;Patient limited by fatigue Patient left: in bed;with call bell/phone within reach;with bed alarm set Nurse Communication: Mobility status;Other (comment) PT Visit Diagnosis: Unsteadiness on feet (R26.81);Difficulty in walking, not elsewhere classified (R26.2);Muscle weakness (generalized) (M62.81)     Time: 8159-4707 PT Time Calculation (min) (ACUTE ONLY): 8 min  Charges:  $Gait Training: 8-22 mins                    Chesley Noon, PTA 12/03/20, 10:09 AM

## 2020-12-04 LAB — BASIC METABOLIC PANEL
Anion gap: 11 (ref 5–15)
BUN: 33 mg/dL — ABNORMAL HIGH (ref 8–23)
CO2: 30 mmol/L (ref 22–32)
Calcium: 8.9 mg/dL (ref 8.9–10.3)
Chloride: 91 mmol/L — ABNORMAL LOW (ref 98–111)
Creatinine, Ser: 0.91 mg/dL (ref 0.44–1.00)
GFR, Estimated: >60 mL/min (ref >60)
Glucose, Bld: 120 mg/dL — ABNORMAL HIGH (ref 70–99)
Potassium: 4.3 mmol/L (ref 3.5–5.1)
Sodium: 132 mmol/L — ABNORMAL LOW (ref 135–145)

## 2020-12-04 LAB — HCV AB W REFLEX TO QUANT PCR: HCV Ab: <0.1 s/co ratio (ref 0.0–0.9)

## 2020-12-04 LAB — HCV INTERPRETATION

## 2020-12-04 MED ORDER — PREDNISONE 10 MG PO TABS
10.0000 mg | ORAL_TABLET | Freq: Every day | ORAL | Status: DC
  Administered 2020-12-05 – 2020-12-06 (×2): 10 mg via ORAL
  Filled 2020-12-04 (×2): qty 1

## 2020-12-04 NOTE — Progress Notes (Signed)
Subjective: 7 Days Post-Op Procedure(s) (LRB): REVERSE SHOULDER ARTHROPLASTY (Right) Patient reports pain as mild in the right shoulder this morning.  Breathing has improved, Pulmonology was consulted.  Currently following. BP improved. Current plan is for discharge to SNF when appropriate. Patient has had a BM.  No recent fevers, no N/V  Objective: Vital signs in last 24 hours: Temp:  [97.9 F (36.6 C)-98.3 F (36.8 C)] 97.9 F (36.6 C) (09/27 1218) Pulse Rate:  [59-70] 59 (09/27 1218) Resp:  [15-18] 18 (09/27 1218) BP: (134-164)/(66-71) 137/71 (09/27 1218) SpO2:  [92 %-95 %] 93 % (09/27 1218)  Intake/Output from previous day: No intake or output data in the 24 hours ending 12/04/20 1308   Intake/Output this shift: No intake/output data recorded.  Labs: No results for input(s): HGB in the last 72 hours.  No results for input(s): WBC, RBC, HCT, PLT in the last 72 hours.  Recent Labs    12/03/20 0546 12/04/20 0646  NA 131* 132*  K 4.1 4.3  CL 92* 91*  CO2 31 30  BUN 41* 33*  CREATININE 1.03* 0.91  GLUCOSE 200* 120*  CALCIUM 9.0 8.9   No results for input(s): LABPT, INR in the last 72 hours.  EXAM General - Patient is Alert, Appropriate, and Oriented Lungs:  Mild rhonchi and mild wheeze noted. Extremity - Neurologically intact Intact pulses distally Incision: dressing C/D/I No cellulitis present Compartment soft Sensation to light touch intact to the right arm. Dressing/Incision - clean, dry, no drainage Motor Function - intact, moving foot and toes well on exam.  Abdomen soft with normal bowel sounds intact.  Past Medical History:  Diagnosis Date   Anemia    Arthritis    Asthma    H/O YEARS AGO-NOW HAS COUGHING SPELLS WHICH IS WHY SHE HAS HER ALBUTEROL INHALER   Basal cell carcinoma    R nose ([facility])   Basal cell carcinoma 01/29/2016   Right forehead above med brow   Basal cell carcinoma 10/12/2019   L nasal tip - [facility] 05/15/2020   BRCA gene  mutation negative 08/12/2017   Variant of unknown significance at [facility] only abnormality.   Breast cancer ([place]) 08/04/2017   1.8 cm ER 90%, PR 20%, HER-2/neu negative invasive mammary carcinoma of the left upper outer quadrant.  MammaPrint: High risk.   Cancer ([place])    [phone] basal cell carcinoma   Complication of anesthesia    HARD TIME WAKING UP   Dysplastic nevus 02/21/2010   Right mid pretibial, mild   Dysrhythmia    TACHYCARDIA   GERD (gastroesophageal reflux disease)    History of hiatal hernia    SMALL   Hyperlipidemia    Hypertension    PT STATES IN 01-18-18 PREOP PHONE INTERVIEW THAT HER BP HAS BEEN ELEVATED SINCE STARTING ON VALSARTAN- PCP SWITCHED PT BACK TO [facility] ON 01-18-18 AND PT STATES THAT SHE HAS LOST 6-10 POUNDS OF FLUID SINCE RESTARTING MAXIDE.     Personal history of chemotherapy    PONV (postoperative nausea and vomiting)    Sjogren's syndrome ([place]) 07/2020   Assessment/Plan: 7 Days Post-Op Procedure(s) (LRB): REVERSE SHOULDER ARTHROPLASTY (Right) Active Problems:   Status post reverse arthroplasty of shoulder, right  Estimated body mass index is 25.91 kg/m as calculated from the following:   Height as of this encounter: 5' 6.5" (1.689 m).   Weight as of this encounter: 73.9 kg. Up with therapy   Labs reviewed this AM. Na 132. CT chest demonstrated evidence for aspiration  pneumonia.  Patient started on Flagyl per pulmonology. Hepatomegaly noted on CT scan.  Liver US was performed.  Infectious etiology has been r/o. DG sniff test ordered. Patient has had a BM. Patient is cleared for discharge from orthopaedic perspective, will monitor for pulmonology progress note today. When medically cleared, plan is for discharge to SNF.  DVT Prophylaxis - Lovenox and Foot Pumps Non-weightbearing to the right arm.  Raquel Darel Ricketts, PA-C [facility] Orthopaedic Surgery 12/04/2020, 1:08 PM

## 2020-12-04 NOTE — Anesthesia Postprocedure Evaluation (Signed)
Anesthesia Post Note  Patient: OMNI DUNSWORTH  Procedure(s) Performed: REVERSE SHOULDER ARTHROPLASTY (Right: Shoulder)  Patient location during evaluation: PACU Anesthesia Type: General Level of consciousness: awake and alert Pain management: pain level controlled Vital Signs Assessment: post-procedure vital signs reviewed and stable Respiratory status: spontaneous breathing, nonlabored ventilation, respiratory function stable and patient connected to nasal cannula oxygen Cardiovascular status: blood pressure returned to baseline and stable Postop Assessment: no apparent nausea or vomiting Anesthetic complications: no   No notable events documented.   Last Vitals:  Vitals:   12/04/20 1218 12/04/20 1534  BP: 137/71 (!) 144/64  Pulse: (!) 59 66  Resp: 18 18  Temp: 36.6 C 36.6 C  SpO2: 93% 94%    Last Pain:  Vitals:   12/04/20 0900  TempSrc:   PainSc: 3                  Molli Barrows

## 2020-12-04 NOTE — Progress Notes (Signed)
Occupational Therapy Treatment Patient Details Name: Mariah Hogan MRN: [id number] DOB: 02-18-1951 Today's Date: 12/04/2020   History of present illness Pt is a 70 yo F diagnosed with massive irreparable right rotator cuff tear with early cuff arthropathy and is s/p elective right reverse total shoulder arthroplasty.  PMH includes: HTN, anemia, skin CA, and Left breast CA.   OT comments  Pt seen for OT tx this date. Pt in bed, agreeable to therapy and motivated to participate. Pt performed bed mobility, getting to EOB on R side with mild increased effort, as she has been getting out on the L side. Nevertheless, managed well and without assist required. Pt negotiated obstacles in the room to get to the bathroom using a wide base quad cane. Performed toilet transfer to std height toilet with supervision, good controlled descent. Pt mod indep with toileting hygiene using lateral lean, supervision in standing to complete donning of briefs over hips. OT facilitated additional instruction in shoulder precautions, strategies to maintain during ADL/IADL, strategies to protect RUE during ADL/mobility. Pt verbalized understanding. Continues to benefit from skilled OT services to maximize safety/indep with ADL/IADL.    Recommendations for follow up therapy are one component of a multi-disciplinary discharge planning process, led by the attending physician.  Recommendations may be updated based on patient status, additional functional criteria and insurance authorization.    Follow Up Recommendations  SNF;Follow surgeon's recommendation for DC plan and follow-up therapies    Equipment Recommendations  3 in 1 bedside commode;Other (comment) (reacher, dressing stick, LH sponge)    Recommendations for Other Services      Precautions / Restrictions Precautions Precautions: Fall;Shoulder Shoulder Interventions: Shoulder sling/immobilizer;At all times;Off for dressing/bathing/exercises;Shoulder abduction  pillow Restrictions Weight Bearing Restrictions: Yes RUE Weight Bearing: Non weight bearing       Mobility Bed Mobility Overal bed mobility: Modified Independent Bed Mobility: Supine to Sit     Supine to sit: Modified independent (Device/Increase time);HOB elevated     General bed mobility comments: able to exit R side of bed without assist    Transfers Overall transfer level: Needs assistance Equipment used: Quad cane Transfers: Sit to/from Stand Sit to Stand: Supervision         General transfer comment: Good eccentric control while lowering however pt reports her legs are "shaky" - PT reminded pt ro reach back with LUE to control descent    Balance Overall balance assessment: Needs assistance Sitting-balance support: No upper extremity supported;Feet supported Sitting balance-Leahy Scale: Good     Standing balance support: Single extremity supported;No upper extremity supported;During functional activity Standing balance-Leahy Scale: Fair Standing balance comment: Was able to stand static without UE support, CGA for safety                           ADL either performed or assessed with clinical judgement   ADL Overall ADL's : Needs assistance/impaired Eating/Feeding: Set up Eating/Feeding Details (indicate cue type and reason): set up for opening some packets/containers                     Toilet Transfer: Supervision/safety;Ambulation;Regular Glass blower/designer Details (indicate cue type and reason): wide base 4point cane Toileting- Clothing Manipulation and Hygiene: Sitting/lateral lean;Modified independent               Vision       Perception     Praxis      Cognition Arousal/Alertness: Awake/alert Behavior During  Therapy: WFL for tasks assessed/performed Overall Cognitive Status: Within Functional Limits for tasks assessed                                 General Comments: pleasant and agreeable to  therapy        Exercises Other Exercises Other Exercises: OT facilitated additional instruction in shoulder precautions, strategies to maintain during ADL/IADL, strategies to protect RUE during ADL/mobility   Shoulder Instructions       General Comments      Pertinent Vitals/ Pain       Pain Assessment: 0-10 Pain Score: 5  Pain Location: R shoulder Pain Descriptors / Indicators: Sore;Aching;Grimacing Pain Intervention(s): Limited activity within patient's tolerance;Monitored during session;Premedicated before session;Repositioned;Ice applied  Home Living                                          Prior Functioning/Environment              Frequency  Min 1X/week        Progress Toward Goals  OT Goals(current goals can now be found in the care plan section)  Progress towards OT goals: Progressing toward goals  Acute Rehab OT Goals Patient Stated Goal: To be able to cook and care for myself OT Goal Formulation: With patient Time For Goal Achievement: 12/12/20 Potential to Achieve Goals: Good  Plan Discharge plan remains appropriate;Frequency remains appropriate    Co-evaluation                 AM-PAC OT "6 Clicks" Daily Activity     Outcome Measure   Help from another person eating meals?: None Help from another person taking care of personal grooming?: A Little Help from another person toileting, which includes using toliet, bedpan, or urinal?: None Help from another person bathing (including washing, rinsing, drying)?: A Lot Help from another person to put on and taking off regular upper body clothing?: A Lot Help from another person to put on and taking off regular lower body clothing?: A Little 6 Click Score: 18    End of Session    OT Visit Diagnosis: Other abnormalities of gait and mobility (R26.89);Muscle weakness (generalized) (M62.81);Pain Pain - Right/Left: Right Pain - part of body: Shoulder;Arm   Activity Tolerance  Patient tolerated treatment well   Patient Left in chair;with call bell/phone within reach;with chair alarm set;Other (comment) (shoulder sling, polar care in place)   Nurse Communication          Time: 9458-5929 OT Time Calculation (min): 19 min  Charges: OT General Charges $OT Visit: 1 Visit OT Treatments $Self Care/Home Management : 8-22 mins  Ardeth Perfect., MPH, MS, OTR/L ascom [phone] 12/04/20, 5:00 PM

## 2020-12-04 NOTE — Progress Notes (Signed)
Physical Therapy Treatment Patient Details Name: Mariah Hogan MRN: [id number] DOB: 08-27-1950 Today's Date: 12/04/2020   History of Present Illness Pt is a 70 yo F diagnosed with massive irreparable right rotator cuff tear with early cuff arthropathy and is s/p elective right reverse total shoulder arthroplasty.  PMH includes: HTN, anemia, skin CA, and Left breast CA.    PT Comments    Pt received supine in bed, agreeable to therapy. Pt performed bed mobility Mod I, using bed features to assist however completed with ease. Wide-based QC used to stabilize upon initial stand and to ambulate. Ambulation distance has increased from 52f to 1242f Deficits in gait pattern continue to include decreased stride length, decreased gait velocity and forward trunk lean with reliance on AD for stability. Pt did feel fatigued at end of session and required multiple standing rest breaks due to SOB. Pt did not require VC to maintain NWB status of RUE. Pt ended session in chair with all needs met.     Recommendations for follow up therapy are one component of a multi-disciplinary discharge planning process, led by the attending physician.  Recommendations may be updated based on patient status, additional functional criteria and insurance authorization.  Follow Up Recommendations  SNF;Supervision for mobility/OOB     Equipment Recommendations  None recommended by PT;Other (comment) (TBD at next venue of care)    Recommendations for Other Services       Precautions / Restrictions Precautions Precautions: Fall;Shoulder Shoulder Interventions: Shoulder sling/immobilizer;At all times;Off for dressing/bathing/exercises;Shoulder abduction pillow Restrictions Weight Bearing Restrictions: Yes RUE Weight Bearing: Non weight bearing     Mobility  Bed Mobility Overal bed mobility: Modified Independent             General bed mobility comments: Easily sits up to EOB using bed features     Transfers Overall transfer level: Needs assistance Equipment used: Quad cane Transfers: Sit to/from Stand Sit to Stand: Supervision         General transfer comment: Good eccentric control while lowering however pt reports her legs are "shaky" - PT reminded pt ro reach back with LUE to control descent  Ambulation/Gait Ambulation/Gait assistance: Min guard Gait Distance (Feet): 120 Feet Assistive device: Quad cane Gait Pattern/deviations: Step-through pattern;Decreased stride length Gait velocity: significantly decreased   General Gait Details: Wide-base QC in LUE. decreased velocity with occasional standing rest breaks due to SOB, pt able to recover in standing   Stairs             Wheelchair Mobility    Modified Rankin (Stroke Patients Only)       Balance Overall balance assessment: Needs assistance Sitting-balance support: No upper extremity supported;Feet supported Sitting balance-Leahy Scale: Good     Standing balance support: No upper extremity supported Standing balance-Leahy Scale: Fair Standing balance comment: Was able to stand static without UE support, CGA for safety                            Cognition Arousal/Alertness: Awake/alert Behavior During Therapy: WFL for tasks assessed/performed Overall Cognitive Status: Within Functional Limits for tasks assessed                                 General Comments: pleasant and agreeable to therapy      Exercises      General Comments  Pertinent Vitals/Pain Pain Assessment: 0-10 Pain Score: 4  Pain Location: R shoulder - elevated to 5/10 after mobility Pain Descriptors / Indicators: Sore;Aching;Grimacing Pain Intervention(s): Limited activity within patient's tolerance;Monitored during session;Premedicated before session;Repositioned    Home Living                      Prior Function            PT Goals (current goals can now be found in the  care plan section) Acute Rehab PT Goals Patient Stated Goal: To be able to cook and care for myself PT Goal Formulation: With patient Time For Goal Achievement: 12/10/20 Potential to Achieve Goals: Good    Frequency    7X/week      PT Plan      Co-evaluation              AM-PAC PT "6 Clicks" Mobility   Outcome Measure  Help needed turning from your back to your side while in a flat bed without using bedrails?: A Little Help needed moving from lying on your back to sitting on the side of a flat bed without using bedrails?: A Little Help needed moving to and from a bed to a chair (including a wheelchair)?: A Little Help needed standing up from a chair using your arms (e.g., wheelchair or bedside chair)?: A Little Help needed to walk in hospital room?: A Little Help needed climbing 3-5 steps with a railing? : A Little 6 Click Score: 18    End of Session Equipment Utilized During Treatment: Gait belt Activity Tolerance: Patient tolerated treatment well;Patient limited by fatigue Patient left: with call bell/phone within reach;in chair;with chair alarm set Nurse Communication: Mobility status PT Visit Diagnosis: Unsteadiness on feet (R26.81);Difficulty in walking, not elsewhere classified (R26.2);Muscle weakness (generalized) (M62.81)     Time: 0233-4356 PT Time Calculation (min) (ACUTE ONLY): 24 min  Charges:  $Therapeutic Activity: 23-37 mins                     Mariah Hogan PT, DPT 12/04/20 4:19 PM [phone]    Mariah Hogan 12/04/2020, 4:14 PM

## 2020-12-05 ENCOUNTER — Inpatient Hospital Stay: Payer: Medicare Other

## 2020-12-05 LAB — RESP PANEL BY RT-PCR (FLU A&B, COVID) ARPGX2
Influenza A by PCR: NEGATIVE
Influenza B by PCR: NEGATIVE
SARS Coronavirus 2 by RT PCR: NEGATIVE

## 2020-12-05 LAB — SARS CORONAVIRUS 2 (TAT 6-24 HRS): SARS Coronavirus 2: NEGATIVE

## 2020-12-05 MED ORDER — ATENOLOL 25 MG PO TABS: 12.5000 mg | ORAL_TABLET | Freq: Two times a day (BID) | ORAL | 0 refills | Status: AC

## 2020-12-05 MED ORDER — METRONIDAZOLE 500 MG PO TABS: 500.0000 mg | ORAL_TABLET | Freq: Two times a day (BID) | ORAL | 0 refills | Status: DC

## 2020-12-05 NOTE — Progress Notes (Signed)
Subjective: 8 Days Post-Op Procedure(s) (LRB): REVERSE SHOULDER ARTHROPLASTY (Right) Patient reports pain as mild in the right shoulder this morning.  Breathing has improved, Pulmonology was consulted and has cleared the patient for discharge at this time. Current plan is for discharge to SNF this afternoon. Patient has had a BM.  No recent fevers, no N/V  Objective: Vital signs in last 24 hours: Temp:  [97.8 F (36.6 C)-98.5 F (36.9 C)] 98.5 F (36.9 C) (09/28 1119) Pulse Rate:  [61-67] 62 (09/28 1119) Resp:  [16-19] 18 (09/28 1119) BP: (128-153)/(59-89) 128/59 (09/28 1119) SpO2:  [93 %-95 %] 93 % (09/28 1119)  Intake/Output from previous day: No intake or output data in the 24 hours ending 12/05/20 1235   Intake/Output this shift: No intake/output data recorded.  Labs: No results for input(s): HGB in the last 72 hours.  No results for input(s): WBC, RBC, HCT, PLT in the last 72 hours.  Recent Labs    12/03/20 0546 12/04/20 0646  NA 131* 132*  K 4.1 4.3  CL 92* 91*  CO2 31 30  BUN 41* 33*  CREATININE 1.03* 0.91  GLUCOSE 200* 120*  CALCIUM 9.0 8.9   No results for input(s): LABPT, INR in the last 72 hours.  EXAM General - Patient is Alert, Appropriate, and Oriented Lungs:  Mild rhonchi and mild wheeze noted. Extremity - Neurologically intact Intact pulses distally Incision: dressing C/D/I No cellulitis present Compartment soft Sensation to light touch intact to the right arm. Dressing/Incision - clean, dry, no drainage Motor Function - intact, moving foot and toes well on exam.  Abdomen soft with normal bowel sounds intact.  Past Medical History:  Diagnosis Date   Anemia    Arthritis    Asthma    H/O YEARS AGO-NOW HAS COUGHING SPELLS WHICH IS WHY SHE HAS HER ALBUTEROL INHALER   Basal cell carcinoma    R nose ([facility])   Basal cell carcinoma 01/29/2016   Right forehead above med brow   Basal cell carcinoma 10/12/2019   L nasal tip - [facility] 05/15/2020    BRCA gene mutation negative 08/12/2017   Variant of unknown significance at [facility] only abnormality.   Breast cancer ([place]) 08/04/2017   1.8 cm ER 90%, PR 20%, HER-2/neu negative invasive mammary carcinoma of the left upper outer quadrant.  MammaPrint: High risk.   Cancer ([place])    [phone] basal cell carcinoma   Complication of anesthesia    HARD TIME WAKING UP   Dysplastic nevus 02/21/2010   Right mid pretibial, mild   Dysrhythmia    TACHYCARDIA   GERD (gastroesophageal reflux disease)    History of hiatal hernia    SMALL   Hyperlipidemia    Hypertension    PT STATES IN 01-18-18 PREOP PHONE INTERVIEW THAT HER BP HAS BEEN ELEVATED SINCE STARTING ON VALSARTAN- PCP SWITCHED PT BACK TO [facility] [facility] ON 01-18-18 AND PT STATES THAT SHE HAS LOST 6-10 POUNDS OF FLUID SINCE RESTARTING MAXIDE.     Personal history of chemotherapy    PONV (postoperative nausea and vomiting)    Sjogren's syndrome ([place]) 07/2020   Assessment/Plan: 8 Days Post-Op Procedure(s) (LRB): REVERSE SHOULDER ARTHROPLASTY (Right) Active Problems:   Status post reverse arthroplasty of shoulder, right  Estimated body mass index is 25.91 kg/m as calculated from the following:   Height as of this encounter: 5' 6.5" (1.689 m).   Weight as of this encounter: 73.9 kg. Up with therapy   Labs reviewed this AM. Na 132. CT  chest demonstrated evidence for aspiration pneumonia.  Patient started on Flagyl per pulmonology, will continue for 3 additional days. Hepatomegaly noted on CT scan.  Liver US was performed.  Infectious etiology has been r/o. DG sniff test performed today. Patient has had a BM. Patient is cleared for discharge today. Follow-up with pulmonology in 3 months. Follow-up with Orthopaedics next weeks for staple removal.  DVT Prophylaxis - Lovenox and Foot Pumps Non-weightbearing to the right arm.  Raquel Josclyn Rosales, PA-C [facility] Orthopaedic Surgery 12/05/2020, 12:35 PM

## 2020-12-05 NOTE — Progress Notes (Signed)
Physical Therapy Treatment Patient Details Name: Mariah Hogan MRN: [id number] DOB: [birth year] Today's Date: 12/05/2020   History of Present Illness Pt is a 70 yo F diagnosed with massive irreparable right rotator cuff tear with early cuff arthropathy and is s/p elective right reverse total shoulder arthroplasty.  PMH includes: HTN, anemia, skin CA, and Left breast CA.    PT Comments    Pt received supine in bed, agreeable to therapy. Bed mobility performed Mod I and STS with SUP, no AD during STS. Balance has improved as pt was able to partially doff pants in standing and complete the task in sitting; PT providing SUP throughout. Per MD request: initiated out of sling RUE ROM/exercises including active digit, wrist and elbow flexion and extension. PT supported RUE throughout and provided additional range to wrist and elbow via AAROM. All movements remained in the sagittal plane and within pain-free ROM. Pt did report tightness with elbow extension, pt lacks ~35 degrees. Gentle GH flexion and extension (to neutral) PROM was also initiated at up to 20 degrees of flexion. Would benefit from skilled PT to address above deficits and promote optimal return to PLOF.   Recommendations for follow up therapy are one component of a multi-disciplinary discharge planning process, led by the attending physician.  Recommendations may be updated based on patient status, additional functional criteria and insurance authorization.  Follow Up Recommendations  SNF;Supervision for mobility/OOB     Equipment Recommendations  None recommended by PT;Other (comment) (TBD at next venue of care)    Recommendations for Other Services       Precautions / Restrictions Precautions Precautions: Shoulder Shoulder Interventions: Shoulder sling/immobilizer;At all times;Off for dressing/bathing/exercises;Shoulder abduction pillow Restrictions Weight Bearing Restrictions: Yes RUE Weight Bearing: Non weight bearing      Mobility  Bed Mobility Overal bed mobility: Modified Independent Bed Mobility: Supine to Sit;Sit to Supine           General bed mobility comments: able to exit R side of bed without assist    Transfers Overall transfer level: Needs assistance Equipment used: None Transfers: Sit to/from Stand Sit to Stand: Supervision         General transfer comment: stood from EOB without need of AD  Ambulation/Gait             General Gait Details: deferred - focused on shoulder mobilization per MD request   Stairs             Wheelchair Mobility    Modified Rankin (Stroke Patients Only)       Balance Overall balance assessment: Needs assistance Sitting-balance support: No upper extremity supported;Feet supported Sitting balance-Leahy Scale: Good     Standing balance support: No upper extremity supported;During functional activity Standing balance-Leahy Scale: Good Standing balance comment: able to maintain standing balance while doffing pants (pulled them off ankles while in sitting) while using no AD                            Cognition Arousal/Alertness: Awake/alert Behavior During Therapy: WFL for tasks assessed/performed Overall Cognitive Status: Within Functional Limits for tasks assessed                                 General Comments: pleasant and agreeable to therapy      Exercises Other Exercises Other Exercises: Per MD request: initiated out of sling RUE ROM/exercises  including active digit, wrist and elbow flexion and extension. PT supported RUE throughout and provided additional range to wrist and elbow via AAROM. All movements remained in the sagittal plane and within pain-free ROM. Pt did report tightness with elbow extension, pt lacks ~35 degrees. Gentle GH flexion and extension (to neutral) PROM was also initiated at up to 20 degrees of flexion. PT donned and doffed sling for pt . At beginning of session, PT also  assisted with doffing pants and donning gown.    General Comments        Pertinent Vitals/Pain Pain Assessment: No/denies pain    Home Living                      Prior Function            PT Goals (current goals can now be found in the care plan section) Acute Rehab PT Goals Patient Stated Goal: To be able to cook and care for myself    Frequency    7X/week      PT Plan      Co-evaluation              AM-PAC PT "6 Clicks" Mobility   Outcome Measure  Help needed turning from your back to your side while in a flat bed without using bedrails?: None Help needed moving from lying on your back to sitting on the side of a flat bed without using bedrails?: None Help needed moving to and from a bed to a chair (including a wheelchair)?: A Little Help needed standing up from a chair using your arms (e.g., wheelchair or bedside chair)?: A Little Help needed to walk in hospital room?: A Little Help needed climbing 3-5 steps with a railing? : A Little 6 Click Score: 20    End of Session   Activity Tolerance: Patient tolerated treatment well Patient left: in bed;with call bell/phone within reach Nurse Communication: Mobility status PT Visit Diagnosis: Unsteadiness on feet (R26.81);Difficulty in walking, not elsewhere classified (R26.2);Muscle weakness (generalized) (M62.81)     Time: 3785-8850 PT Time Calculation (min) (ACUTE ONLY): 24 min  Charges:  $Therapeutic Exercise: 23-37 mins                     Mariah Hogan PT, DPT 12/05/20 5:41 PM [phone]    Mariah Hogan 12/05/2020, 5:39 PM

## 2020-12-05 NOTE — Progress Notes (Signed)
Occupational Therapy Treatment Patient Details Name: Mariah Hogan MRN: [id number] DOB: [birth year] Today's Date: 12/05/2020   History of present illness Pt is a 70 yo F diagnosed with massive irreparable right rotator cuff tear with early cuff arthropathy and is s/p elective right reverse total shoulder arthroplasty.  PMH includes: HTN, anemia, skin CA, and Left breast CA.   OT comments  Ms Kuntzman was seen for OT treatment on this date. Upon arrival to room pt seated on toilet. Pt requires SETUP + SBA + QC for toilet t/f and standing grooming using non-dominant LUE only. Pt demonstrates poor safety awareness, required cues to ID underwear around knees after ~10 ft mobility after toileting. MOD A don/doff sling and shirt, MAX A don/doff polar care seated EOB. Instructed on polar care mgmt, adapted dressing techniques, DME recs, and home/routines modifications. Pt making good progress toward goals. Pt continues to benefit from skilled OT services to maximize return to PLOF and minimize risk of future falls, injury, caregiver burden, and readmission. Will continue to follow POC. Discharge recommendation remains appropriate.     Recommendations for follow up therapy are one component of a multi-disciplinary discharge planning process, led by the attending physician.  Recommendations may be updated based on patient status, additional functional criteria and insurance authorization.    Follow Up Recommendations  SNF;Follow surgeon's recommendation for DC plan and follow-up therapies    Equipment Recommendations  3 in 1 bedside commode    Recommendations for Other Services      Precautions / Restrictions Precautions Precautions: Shoulder Shoulder Interventions: Shoulder sling/immobilizer;At all times;Off for dressing/bathing/exercises;Shoulder abduction pillow Restrictions RUE Weight Bearing: Non weight bearing       Mobility Bed Mobility Overal bed mobility: Modified Independent                   Transfers Overall transfer level: Needs assistance Equipment used: Quad cane Transfers: Sit to/from Stand Sit to Stand: Supervision         General transfer comment: requires grab bar standing from commode height    Balance Overall balance assessment: Needs assistance Sitting-balance support: No upper extremity supported;Feet supported Sitting balance-Leahy Scale: Good     Standing balance support: Single extremity supported;No upper extremity supported;During functional activity Standing balance-Leahy Scale: Fair                             ADL either performed or assessed with clinical judgement   ADL Overall ADL's : Needs assistance/impaired                                       General ADL Comments: SETUP + SBA + QC for toilet t/f and standing grooming using non-dominant LUE only. Pt demonstrates poor safety awareness, required cues to ID underwear around knees after ~10 ft mobility after toileting. MOD A don/doff sling and shirt, MAX A don/doff polar care seated EOB.      Cognition Arousal/Alertness: Awake/alert Behavior During Therapy: WFL for tasks assessed/performed Overall Cognitive Status: Within Functional Limits for tasks assessed                                 General Comments: pleasant and agreeable to therapy        Exercises Exercises: Other exercises Other Exercises Other Exercises: Pt  educated re: adapted dressing strategies, polar care mgmt, ECS, home/routines modifications, falls prevention Other Exercises: LBD, UBD, toileting, grooming, sit<>stand, sitting/standing balance/tolerance           Pertinent Vitals/ Pain       Pain Assessment: No/denies pain   Frequency  Min 1X/week        Progress Toward Goals  OT Goals(current goals can now be found in the care plan section)  Progress towards OT goals: Progressing toward goals  Acute Rehab OT Goals Patient Stated Goal: To  be able to cook and care for myself OT Goal Formulation: With patient Time For Goal Achievement: 12/12/20 Potential to Achieve Goals: Good ADL Goals Pt Will Perform Grooming: standing;with modified independence Pt Will Perform Upper Body Dressing: sitting;with set-up;with supervision Pt Will Perform Lower Body Dressing: with modified independence;sit to/from stand  Plan Discharge plan remains appropriate;Frequency remains appropriate    Co-evaluation                 AM-PAC OT "6 Clicks" Daily Activity     Outcome Measure   Help from another person eating meals?: None Help from another person taking care of personal grooming?: A Little Help from another person toileting, which includes using toliet, bedpan, or urinal?: None Help from another person bathing (including washing, rinsing, drying)?: A Lot Help from another person to put on and taking off regular upper body clothing?: A Lot Help from another person to put on and taking off regular lower body clothing?: A Little 6 Click Score: 18    End of Session    OT Visit Diagnosis: Other abnormalities of gait and mobility (R26.89);Muscle weakness (generalized) (M62.81)   Activity Tolerance Patient tolerated treatment well   Patient Left in bed;with call bell/phone within reach   Nurse Communication          Time: 7408-1448 OT Time Calculation (min): 26 min  Charges: OT General Charges $OT Visit: 1 Visit OT Treatments $Self Care/Home Management : 23-37 mins  Dessie Coma, M.S. OTR/L  12/05/20, 11:20 AM  ascom [phone]

## 2020-12-05 NOTE — Discharge Summary (Addendum)
Physician Discharge Summary  Patient ID: JALAINA SALYERS MRN: [id number] DOB/AGE: [birth year] 72 y.o.  Admit date: 11/27/2020 Discharge date: 12/06/2020  Admission Diagnoses:  Status post reverse arthroplasty of shoulder, right [Z96.611] Acute cough and labored breathing- resolved. Hemidiaphragm elevation Hepatomegaly Aspiration pneumonia  Discharge Diagnoses: Patient Active Problem List   Diagnosis Date Noted   Status post reverse arthroplasty of shoulder, right 11/27/2020   Carcinoma of overlapping sites of left breast in female, estrogen receptor positive ([place]) 07/30/2020   Personal history of other malignant neoplasm of skin 11/17/2019   Complex tear of medial meniscus of left knee as current injury 08/10/2019   Primary osteoarthritis of left knee 08/10/2019   S/P breast reconstruction, left 04/15/2019   Acquired absence of left breast 12/23/2018   Breast asymmetry following reconstructive surgery 08/31/2018   Acquired absence of breast 02/09/2018   S/P mastectomy, left 02/02/2018   Breast cancer ([place]) 01/27/2018   Thrush 11/05/2017   Goals of care, counseling/discussion 10/09/2017   Hypokalemia 10/09/2017   Acute renal failure (ARF) ([place]) 09/11/2017   Diarrhea 09/11/2017   Encounter for antineoplastic chemotherapy 09/03/2017   Nodule of upper lobe of right lung 08/24/2017   Osteopenia 08/24/2017   Malignant neoplasm of left female breast ([place]) 08/07/2017   Invasive ductal carcinoma of left breast ([place]) 08/05/2017   Incomplete emptying of bladder 09/03/2016   SUI (stress urinary incontinence, female) 09/03/2016   Mass of right breast 02/14/2014   Mass of upper outer quadrant of left breast 01/20/2014   Arthritis 12/28/2013   Asthma without status asthmaticus 12/28/2013   Esophageal reflux 12/28/2013   Hyperlipidemia 12/28/2013   Hypertension 05/09/2003    Past Medical History:  Diagnosis Date   Anemia    Arthritis    Asthma    H/O YEARS AGO-NOW HAS COUGHING  SPELLS WHICH IS WHY SHE HAS HER ALBUTEROL INHALER   Basal cell carcinoma    R nose ([facility])   Basal cell carcinoma 01/29/2016   Right forehead above med brow   Basal cell carcinoma 10/12/2019   L nasal tip - [facility] 05/15/2020   BRCA gene mutation negative 08/12/2017   Variant of unknown significance at [facility] only abnormality.   Breast cancer ([place]) 08/04/2017   1.8 cm ER 90%, PR 20%, HER-2/neu negative invasive mammary carcinoma of the left upper outer quadrant.  MammaPrint: High risk.   Cancer ([place])    [phone] basal cell carcinoma   Complication of anesthesia    HARD TIME WAKING UP   Dysplastic nevus 02/21/2010   Right mid pretibial, mild   Dysrhythmia    TACHYCARDIA   GERD (gastroesophageal reflux disease)    History of hiatal hernia    SMALL   Hyperlipidemia    Hypertension    PT STATES IN 01-18-18 PREOP PHONE INTERVIEW THAT HER BP HAS BEEN ELEVATED SINCE STARTING ON VALSARTAN- PCP SWITCHED PT BACK TO [facility] ON 01-18-18 AND PT STATES THAT SHE HAS LOST 6-10 POUNDS OF FLUID SINCE RESTARTING MAXIDE.     Personal history of chemotherapy    PONV (postoperative nausea and vomiting)    Sjogren's syndrome ([place]) 07/2020     Transfusion: None.   Consultants (if any): Treatment Team:  Ottie Glazier, MD  Discharged Condition: Improved  Hospital Course: TRACEY HERMANCE is an 70 y.o. 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    Surgeries: Procedure(s): REVERSE SHOULDER ARTHROPLASTY on 11/27/2020 Patient tolerated the surgery well. Taken to PACU where she was stabilized and then transferred to the orthopedic floor.  Started on Lovenox 70m q 24 hrs. Foot pumps applied bilaterally at 80 mm. Heels elevated on bed with rolled towels. No evidence of DVT. Negative [place]. Physical therapy started on day  #1 for gait training and transfer. OT started day #1 for ADL and assisted devices.  On POD1 the patient did develop a wheeze and cough.  Initially contributed to the recent nerve block and phrenic nerve irritation.  When wheeze and cough continued Pulmonology was consulted.  BP medications were adjusted and the patient was placed on prednisone and treated with Flagyl for aspiration pneumonia.  CT of chest demonstrated hepatomegaly, infectious process ruled out, Liver [place].  Sniff test demonstrated hemidiaphragm dysfunction likely due to fatty liver disease.  Patient is cleared for d/c from Pulmonology, will discharge with 3 days of Flagyl.  Patient's IV was removed on POD9  Implants: All press-fit Integra system with a 7 mm stem, a standard metaphyseal body, a +3 mm humeral platform, a mini baseplate, and a 38 mm concentric +2 mm laterally offset glenosphere.  She was given perioperative antibiotics:  Anti-infectives (From admission, onward)    Start     Dose/Rate Route Frequency Ordered Stop   12/05/20 0000  metroNIDAZOLE (FLAGYL) 500 MG tablet        500 mg Oral Every 12 hours 12/05/20 1239     12/02/20 1315  metroNIDAZOLE (FLAGYL) tablet 500 mg        500 mg Oral Every 12 hours 12/02/20 1215     11/28/20 1000  valACYclovir (VALTREX) tablet 1,000 mg        1,000 mg Oral Daily 11/27/20 1351     11/27/20 1200  clindamycin (CLEOCIN) IVPB 600 mg        600 mg 100 mL/hr over 30 Minutes Intravenous Every 6 hours 11/27/20 1145 11/28/20 0559   11/27/20 0630  ceFAZolin (ANCEF) IVPB 2g/100 mL premix        2 g 200 mL/hr over 30 Minutes Intravenous  Once 11/27/20 0621 11/27/20 0759   11/27/20 0630  clindamycin (CLEOCIN) IVPB 900 mg        900 mg 100 mL/hr over 30 Minutes Intravenous On call to O.R. 11/27/20 0240909/20/22 0809   11/27/20 0626  clindamycin (CLEOCIN) 900 MG/50ML IVPB       Note to Pharmacy: LSylvester Harder  : cabinet override      11/27/20 0626 11/27/20 0801   11/27/20 0626   ceFAZolin (ANCEF) 2-4 GM/100ML-% IVPB       Note to Pharmacy: LSylvester Harder  : cabinet override      11/27/20 0626 11/27/20 0751     .  She was given sequential compression devices, early ambulation, and Lovenox for DVT prophylaxis.  She benefited maximally from the hospital stay and there were no complications.    Recent vital signs:  Vitals:   12/06/20 0332 12/06/20 0813  BP: (!) 149/70 (!) 158/92  Pulse: (!) 56 (!) 57  Resp: 20 16  Temp: 98.4 F (36.9 C) 98.1 F (36.7 C)  SpO2: 96% 96%    Recent laboratory studies:  Lab Results  Component Value Date   HGB 13.2 11/30/2020   HGB 12.4 11/29/2020   HGB 12.3 11/28/2020   Lab Results  Component Value Date   WBC 8.0 11/30/2020  PLT 197 11/30/2020   No results found for: INR Lab Results  Component Value Date   NA 132 (L) 12/04/2020   K 4.3 12/04/2020   CL 91 (L) 12/04/2020   CO2 30 12/04/2020   BUN 33 (H) 12/04/2020   CREATININE 0.91 12/04/2020   GLUCOSE 120 (H) 12/04/2020    Discharge Medications:   Allergies as of 12/06/2020       Reactions   Cytoxan [cyclophosphamide] Shortness Of Breath   BACK PAIN, BACK SPASM   Penicillin G Shortness Of Breath, Swelling, Rash   Tolerated 1st generation cephalosporin (CEFAZOLIN) on 10/21/17 and 01/27/18 without ADRs.  PCN reaction causing immediate rash, facial/tongue/throat swelling, SOB or lightheadedness with hypotension: Yes PCN reaction causing severe rash involving mucus membranes or skin necrosis: No PCN reaction that required hospitalization: No PCN reaction occurring within the last 10 years: No If all of the above answers are "NO", then may proceed with Cephalosporin use.   Taxotere [docetaxel] Shortness Of Breath, Palpitations, Other (See Comments)   Back pain, Back Spasms   Other Other (See Comments)   Dodecyl gallate: Positive patch test  Elta MD UV Clear SPF 46: Positive patch test  fragrance   Parabens Other (See Comments)   Positive patch test     Shellac Other (See Comments)   Positive patch test    Ciprofloxacin Hives, Itching, Rash   Fluocinonide Rash   Tape Dermatitis   Paper tape is ok, tegaderm OK   Triamcinolone Rash        Medication List     STOP taking these medications    gabapentin 300 MG capsule Commonly known as: NEURONTIN       TAKE these medications    atenolol 25 MG tablet Commonly known as: TENORMIN Take 0.5 tablets (12.5 mg total) by mouth 2 (two) times daily. What changed:  medication strength how much to take when to take this   calcium carbonate 1500 (600 Ca) MG Tabs tablet Commonly known as: OSCAL Take 600 mg of elemental calcium by mouth daily with breakfast.   citalopram 20 MG tablet Commonly known as: CELEXA Take 20 mg by mouth at bedtime.   desoximetasone 0.25 % cream Commonly known as: Topicort Apply 1 application topically 2 (two) times daily.   exemestane 25 MG tablet Commonly known as: AROMASIN Take 1 tablet (25 mg total) by mouth daily after breakfast.   HYDROcodone-acetaminophen 5-325 MG tablet Commonly known as: NORCO/VICODIN Take 1-2 tablets by mouth every 4 (four) hours as needed for moderate pain.   hydroxychloroquine 200 MG tablet Commonly known as: PLAQUENIL Take 200 mg by mouth daily.   metroNIDAZOLE 500 MG tablet Commonly known as: FLAGYL Take 1 tablet (500 mg total) by mouth every 12 (twelve) hours.   Na Sulfate-K Sulfate-Mg Sulf 17.5-3.13-1.6 GM/177ML Soln Take by mouth.   ondansetron 4 MG tablet Commonly known as: ZOFRAN Take 1 tablet (4 mg total) by mouth every 6 (six) hours as needed for nausea.   pantoprazole 40 MG tablet Commonly known as: PROTONIX Take 40 mg by mouth 2 (two) times daily.   Premarin vaginal cream Generic drug: conjugated estrogens Place 1 Applicatorful vaginally once a week.   triamterene-hydrochlorothiazide 37.5-25 MG tablet Commonly known as: MAXZIDE-25 Take 1 tablet by mouth daily. What changed: how much to take    valACYclovir 1000 MG tablet Commonly known as: VALTREX Take 1 tablet (1,000 mg total) by mouth daily.   Vitamin D 50 MCG (2000 UT) Caps Take 2,000 Units by  mouth daily.        Diagnostic Studies: DG Chest 2 View  Result Date: 12/01/2020 CLINICAL DATA:  Cough, shortness of breath, pulmonary edema, recent shoulder arthroplasty EXAM: CHEST - 2 VIEW COMPARISON:  11/30/2020 FINDINGS: Unchanged examination with elevation of the right hemidiaphragm and bandlike scarring atelectasis. No acute appearing airspace opacity. Cardiomegaly. Status post right shoulder reverse arthroplasty. IMPRESSION: 1. Unchanged examination with elevation of the right hemidiaphragm and bandlike scarring or atelectasis. No acute appearing airspace opacity. 2.  Cardiomegaly. Electronically Signed   By: Eddie Candle M.D.   On: 12/01/2020 11:24   CT Angio Chest Pulmonary Embolism (PE) W or WO Contrast  Result Date: 12/01/2020 CLINICAL DATA:  PE suspected, high prob history of breast cancer in Sjogren's. EXAM: CT ANGIOGRAPHY CHEST WITH CONTRAST TECHNIQUE: Multidetector CT imaging of the chest was performed using the standard protocol during bolus administration of intravenous contrast. Multiplanar CT image reconstructions and MIPs were obtained to evaluate the vascular anatomy. CONTRAST:  22m OMNIPAQUE IOHEXOL 350 MG/ML SOLN COMPARISON:  None. FINDINGS: Cardiovascular: Evaluation is limited secondary to streak artifact and respiratory motion. Satisfactory opacification of the pulmonary arteries to the segmental level. No evidence of pulmonary embolism. Normal heart size. No pericardial effusion. Atherosclerotic calcifications of the aorta. Elevation of the RIGHT hemidiaphragm. Mediastinum/Nodes: No axillary or mediastinal adenopathy. Thyroid is unremarkable. Lungs/Pleura: Enhancing consolidative opacity of the RIGHT lower lobe, increased in comparison to prior and consistent with atelectasis. This involves approximately half of the  RIGHT lower lobe. There is increased RIGHT middle lobe atelectasis. Scattered endobronchial debris and mild bronchial wall thickening. Upper Abdomen: Small hiatal hernia.  Status post cholecystectomy. Musculoskeletal: Status post LEFT mastectomy with reconstruction. Small foci of air noted in the RIGHT upper extremity soft tissues, likely intravenous and iatrogenic in etiology. Status post RIGHT shoulder arthroplasty. Review of the MIP images confirms the above findings. IMPRESSION: 1. No evidence of pulmonary embolism. 2. Scattered areas of endobronchial debris with increased RIGHT lower lobe and RIGHT middle lobe atelectasis. Findings could be infectious or secondary to aspiration given presence of small hiatal hernia. Aortic Atherosclerosis (ICD10-I70.0). Electronically Signed   By: SValentino SaxonM.D.   On: 12/01/2020 19:59   DG Sniff Test  Result Date: 12/05/2020 CLINICAL DATA:  Diaphragmatic disorder. EXAM: CHEST FLUOROSCOPY TECHNIQUE: Real-time fluoroscopic evaluation of the chest was performed. FLUOROSCOPY TIME:  Fluoroscopy Time:  24 seconds Radiation Exposure Index (if provided by the fluoroscopic device): 1.80 mGy Number of Acquired Spot Images: None COMPARISON:  CT angiogram chest 12/01/2020. prior chest radiographs 12/01/2020 and earlier. FINDINGS: Fluoroscopic imaging acquired by KTsosie Billing PA. Chronically elevated right hemidiaphragm. Persistent right lung base opacity, which may reflect atelectasis and/or pneumonia. There is motion of the right hemidiaphragm with respiration, although with decreased excursion as compared to the left. No paradoxical motion of the right hemidiaphragm is noted. IMPRESSION: Chronically elevated right hemidiaphragm. There is motion of the right hemidiaphragm with respiration, although with decreased excursion as compared to the left. Findings suggest incomplete right diaphragmatic paralysis. Redemonstrated right lung base opacity, which may reflect atelectasis  and/or pneumonia. Normal excursion of the left hemidiaphragm during respiration. Electronically Signed   By: KKellie SimmeringD.O.   On: 12/05/2020 11:25   DG Chest Port 1 View  Result Date: 11/30/2020 CLINICAL DATA:  New cough, wheezing, recent RIGHT shoulder replacement, history childhood asthma EXAM: PORTABLE CHEST 1 VIEW COMPARISON:  Portable exam 0818 hours compared to 11/28/2020 FINDINGS: Enlargement of cardiac silhouette. Mediastinal contours and pulmonary vascularity  normal. Persistent elevation of RIGHT diaphragm with RIGHT basilar atelectasis. Remaining lungs clear. No definite infiltrate, pleural effusion, or pneumothorax. Bones demineralized with note of a RIGHT shoulder prosthesis. IMPRESSION: Elevation of RIGHT diaphragm with RIGHT basilar atelectasis. Enlargement of cardiac silhouette. Electronically Signed   By: Lavonia Dana M.D.   On: 11/30/2020 11:56   DG Chest Port 1 View  Result Date: 11/28/2020 CLINICAL DATA:  Chest congestion EXAM: PORTABLE CHEST 1 VIEW COMPARISON:  08/13/2020 FINDINGS: Cardiac enlargement and aortic atherosclerosis. Chronic asymmetric elevation of the right hemidiaphragm with overlying subsegmental atelectasis versus scarring. No pleural effusion or edema. No airspace consolidation. Previous right shoulder arthroplasty. IMPRESSION: Chronic asymmetric elevation of the right hemidiaphragm with overlying subsegmental atelectasis versus scarring. Electronically Signed   By: Kerby Moors M.D.   On: 11/28/2020 06:10   DG Shoulder Right Port  Result Date: 11/27/2020 CLINICAL DATA:  Status post reverse right shoulder arthroplasty EXAM: PORTABLE RIGHT SHOULDER COMPARISON:  None. FINDINGS: Status post right shoulder reverse total arthroplasty with expected overlying postoperative change. No evidence of perihardware fracture or component malposition. IMPRESSION: Status post right shoulder reverse total arthroplasty with expected overlying postoperative change. No evidence of  perihardware fracture or component malposition. Electronically Signed   By: Eddie Candle M.D.   On: 11/27/2020 10:49   [place] OR NERVE BLOCK-IMAGE ONLY [facility])  Result Date: 11/27/2020 There is no interpretation for this exam.  This order is for images obtained during a surgical procedure.  Please See "Surgeries" Tab for more information regarding the procedure.   US LIVER DOPPLER  Result Date: 12/02/2020 CLINICAL DATA:  History of NASH.  Evaluate hepatic vasculature. EXAM: DUPLEX ULTRASOUND OF LIVER TECHNIQUE: Color and duplex Doppler ultrasound was performed to evaluate the hepatic in-flow and out-flow vessels. COMPARISON:  Chest CT-12/01/2020 FINDINGS: Liver: There is diffuse increased slightly coarsened echogenicity of the hepatic parenchyma suggestive of hepatic steatosis. No discrete lesions are identified. No intrahepatic biliary ductal dilatation. Spleen: Normal in size measuring 9 11.1 x 9.7 x 4.4 cm in diameter (calculated volume of 244 cc) Portal Vein Velocities (normal hepatopetal directional flow) Main:  40 cm/sec Right:  14 cm/sec Left:  12 cm/sec Hepatic Vein Velocities (normal hepatofugal directional flow) Right:  20 cm/sec Middle:  21 cm/sec Left:  23 cm/sec Hepatic Artery Velocity:  86/21 cm/sec Splenic Vein Velocity:  19 cm/sec Varices: None visualized Ascites: None visualized IMPRESSION: 1. Findings suggestive of [place] hepatic steatosis. No discrete hepatic lesions are identified though further evaluation with abdominal MRI could be performed as indicated. 2. Patent hepatic vasculature with normal directional flow. 3. No evidence of portal venous hypertension. Specifically, no evidence of splenomegaly or ascites. Electronically Signed   By: Sandi Mariscal M.D.   On: 12/02/2020 14:16    Disposition: Discharge disposition: 03-Skilled [place] for discharge to SNF today.  Follow-up next week with Orthopaedics for staple removal.  Follow-up with PCP to address adjustment of  blood pressure medications and follow-up with pulmonology in 3 months for outpatient follow-up.   Contact information for follow-up providers     Lattie Corns, PA-C Follow up in 14 day(s).   Specialty: Physician Assistant Why: Levert Feinstein removal Contact information: [place] [address] [phone]         Ottie Glazier, MD Follow up in 3 month(s).   Specialty: Pulmonary Disease Contact information: [place] [address] [phone]         Paulita Cradle  K, MD. Call.   Specialty: Physician Assistant Why: Call for follow-up for hypertension medication management. Contact information: [place] [address] [phone]              Contact information for after-discharge care     Destination     [facility] Preferred SNF .   Service: Skilled Nursing Contact information: [address] [place] [phone]                    Signed: Judson Roch PA-C 12/06/2020, 10:06 AM

## 2020-12-05 NOTE — TOC Progression Note (Signed)
Transition of Care [facility]) - Progression Note    Patient Details  Name: Mariah Hogan MRN: [id number] Date of Birth: 05-05-1950  Transition of Care [facility]) CM/SW [place], RN Phone Number: 12/05/2020, 11:23 AM  Clinical Narrative:   Plan remains to go to [facility] when medically stable, Breathing has improved, TOC to continue to monitor for any needs         Expected Discharge Plan and Services                                                 Social Determinants of Health (SDOH) Interventions    Readmission Risk Interventions No flowsheet data found.

## 2020-12-05 NOTE — Progress Notes (Signed)
Pt stated she spoke with Dr. Lanney Gins and said she no longer needed to use the Metaneb. The Metaneb order still remains but pt does not want to do it any longer. Dayshift RT gave pt Flutter Valve, pt is using Flutter valve independently.

## 2020-12-05 NOTE — Progress Notes (Signed)
Pulmonary Medicine          Date: 12/05/2020,   MRN# [id number] Mariah Hogan February 16, 1951     AdmissionWeight: 73.9 kg                 CurrentWeight: 73.9 kg   Referring provider: Morley Kos   CHIEF COMPLAINT:   Acute cough with labored breathing.    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  She has developed cough with labored breathing and has not been able to exert herself due to ongoing Cough.  She had CXR done and it shows platelike atelectasis as well as interstitial infiltrates. There is also elevation of right hemidiaphragm. We discussed findings and medical plan.  PCCM consult placed for additional evaluation and management for discharge planning.    12/01/20- Patient is improved, she is with less labored breathing.  She is on room air and is able to participate with Metaneb with albuterol.  She is able to take larger tidal volues on incentive spirometer. She diuresed well but urine output was not documented, patient shares it was vigorous. Her sodium levels are slightly lower due to diuresis and I have asked patient to use more salt with meals as she currently avoids salt intake PO. This is transient and will resolve.  Her sugars are slightly higher due to steroids and this also is temporary and does not require additional therapy. I reviewed her CXR with her today and there is much less edema but atelectasis persists on right and left hemidiaphragm still appears higher up then normal especially when compared to CT in June 2022.  We will repeat CT chest today with contrast to evalute for PE and other possible lesions of concern on differential.  In there interim plan is to continue to diurese and continue prednisone at current dose.  12/02/20- patient improved, she coughs better expectorating debris, she had CT  chest done which is + for endobronchial debris consistent with aspirated food as well as atelectasis.  Patient does have hiatal hernia and history of dysphagia s/p EGD with dilation.    12/03/20- patient is doing well, she is not wheezing.  She had US liver - +NASH, infectious hepatitis ruled out. She continues to have some choking episodes post prandially , we discussed doing swallow evaluation once she is stronger in chronic stable state. She is on neurontin TID which may hinder her sensorium and she states she does not feel that its helping her so we will dc this. She has mild pre renal azotemia, lasix is stopped. Tapering prednisone today.   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  We discussed dietary changes to help treat this. Despite this both hemidiaphragms are active indicative of absence of phrenic nerve disruption. She continues to have right lung atelectasis with mucus plugging vs aspiration and is using incentive spirometry which is helping.  She should finish 3 more days of flagyl for aspiration and we can evaluate her on outpatient.  She is cleared for discharge from pulmonary.   PAST MEDICAL HISTORY   Past Medical History:  Diagnosis Date   Anemia    Arthritis    Asthma    H/O YEARS  AGO-NOW HAS COUGHING SPELLS WHICH IS WHY SHE HAS HER ALBUTEROL INHALER   Basal cell carcinoma    R nose ([facility])   Basal cell carcinoma 01/29/2016   Right forehead above med brow   Basal cell carcinoma 10/12/2019   L nasal tip - [facility] 05/15/2020   BRCA gene mutation negative 08/12/2017   Variant of unknown significance at [facility] only abnormality.   Breast cancer ([place]) 08/04/2017   1.8 cm ER 90%, PR 20%, HER-2/neu negative invasive mammary carcinoma of the left upper outer quadrant.  MammaPrint: High risk.   Cancer ([place])    [phone] basal cell carcinoma   Complication  of anesthesia    HARD TIME WAKING UP   Dysplastic nevus 02/21/2010   Right mid pretibial, mild   Dysrhythmia    TACHYCARDIA   GERD (gastroesophageal reflux disease)    History of hiatal hernia    SMALL   Hyperlipidemia    Hypertension    PT STATES IN 01-18-18 PREOP PHONE INTERVIEW THAT HER BP HAS BEEN ELEVATED SINCE STARTING ON VALSARTAN- PCP SWITCHED PT BACK TO [place] ON 01-18-18 AND PT STATES THAT SHE HAS LOST 6-10 POUNDS OF FLUID SINCE RESTARTING MAXIDE.     Personal history of chemotherapy    PONV (postoperative nausea and vomiting)    Sjogren's syndrome ([place]) 07/2020     SURGICAL HISTORY   Past Surgical History:  Procedure Laterality Date   ABDOMINAL HYSTERECTOMY  1998   bladder tack  2002,2008   BREAST BIOPSY Left 1990   BREAST BIOPSY Left 2018   core bx- neg   BREAST BIOPSY Left 08/04/2017   left UOQ 2 oclock INVASIVE DUCTAL CARCINOMA.   BREAST BIOPSY Right 08/2017   MRI biopsy, FIBROCYSTIC CHANGE AND USUAL DUCTAL HYPERPLASIA,, clip placed   BREAST CYST EXCISION  x3   BREAST EXCISIONAL BIOPSY Bilateral    BREAST RECONSTRUCTION WITH PLACEMENT OF TISSUE EXPANDER AND FLEX HD (ACELLULAR HYDRATED DERMIS) Left 01/27/2018   Procedure: BREAST RECONSTRUCTION WITH PLACEMENT OF TISSUE EXPANDER AND FLEX HD (ACELLULAR HYDRATED DERMIS);  Surgeon: Wallace Going, DO;  Location: [facility];  Service: Plastics;  Laterality: Left;   BREAST SURGERY Left 02/13/14   Fibrocystic changes, pseudo-angiomatous stromal hyperplasia. No atypia or malignancy.   CARPAL TUNNEL RELEASE Bilateral x2   CHOLECYSTECTOMY  2008   COLONOSCOPY WITH PROPOFOL N/A 01/12/2015   Procedure: COLONOSCOPY WITH PROPOFOL;  Surgeon: Manya Silvas, MD;  Location: [facility] ENDOSCOPY;  Service: Endoscopy;  Laterality: N/A;   COLONOSCOPY WITH PROPOFOL N/A 02/06/2020   Procedure: COLONOSCOPY WITH PROPOFOL;  Surgeon: Lesly Rubenstein, MD;  Location: [facility] ENDOSCOPY;  Service: Endoscopy;  Laterality: N/A;    ESOPHAGOGASTRODUODENOSCOPY (EGD) WITH PROPOFOL N/A 01/12/2015   Procedure: ESOPHAGOGASTRODUODENOSCOPY (EGD) WITH PROPOFOL;  Surgeon: Manya Silvas, MD;  Location: [facility] ENDOSCOPY;  Service: Endoscopy;  Laterality: N/A;   ESOPHAGOGASTRODUODENOSCOPY (EGD) WITH PROPOFOL N/A 02/06/2020   Procedure: ESOPHAGOGASTRODUODENOSCOPY (EGD) WITH PROPOFOL;  Surgeon: Lesly Rubenstein, MD;  Location: [facility] ENDOSCOPY;  Service: Endoscopy;  Laterality: N/A;   LIPOSUCTION WITH LIPOFILLING Left 12/23/2018   Procedure: LIPOSUCTION WITH LIPOFILLING FROM ABDOMEN TO LEFT BREAST;  Surgeon: Wallace Going, DO;  Location: [place];  Service: Plastics;  Laterality: Left;  90 min, please   MASTECTOMY Left 2019   [facility]   MASTECTOMY W/ SENTINEL NODE BIOPSY Left 01/27/2018   ypT1c ypN0; ER 90%, PR 20%, HER-2/neu not overexpressed.  MASTECTOMY WITH SENTINEL LYMPH NODE BIOPSY;  Surgeon: Robert Bellow, MD;  Location:  [facility] OR[facility];  Service: General;  Laterality: Left;   MINOR BREAST BIOPSY Right 09/02/2017   Radiology perform procedure, fibrocystic changes right retroareolar area.   NASAL RECONSTRUCTION  1983   OOPHORECTOMY     PORT-A-CATH REMOVAL     PORTACATH PLACEMENT N/A 10/21/2017   Procedure: INSERTION PORT-A-CATH;  Surgeon: Robert Bellow, MD;  Location: [facility] ORS;  Service: General;  Laterality: N/A;   REMOVAL OF TISSUE EXPANDER AND PLACEMENT OF IMPLANT Left 05/13/2018   Procedure: REMOVAL OF TISSUE EXPANDER AND PLACEMENT OF IMPLANT;  Surgeon: Wallace Going, DO;  Location: [facility];  Service: Plastics;  Laterality: Left;  total surgery time should be 3 hours, per provider   REVERSE SHOULDER ARTHROPLASTY Right 11/27/2020   Procedure: REVERSE SHOULDER ARTHROPLASTY;  Surgeon: Corky Mull, MD;  Location: [facility];  Service: Orthopedics;  Laterality: Right;   SAVORY DILATION N/A 01/12/2015   Procedure: SAVORY DILATION;  Surgeon: Manya Silvas, MD;  Location: [facility] ENDOSCOPY;  Service:  Endoscopy;  Laterality: N/A;   TONSILLECTOMY AND ADENOIDECTOMY  1956     FAMILY HISTORY   Family History  Problem Relation Age of Onset   Breast cancer Sister 39       currently 52   Breast cancer Paternal Grandmother 67       bilateral; deceased 13s    Breast cancer Cousin 67       daughter of an unaffected maternal aunt   Breast cancer Cousin 58       kidney cancer also; daughter of an unaffected maternal aunt   Other Mother        TAH/BSO prior to menopause   Prostate cancer Paternal Grandfather        age at dx unknown   Breast cancer Maternal Aunt        age at dx unknown   Breast cancer Cousin 77       daughter of an unaffected maternal aunt     SOCIAL HISTORY   Social History   Tobacco Use   Smoking status: Never   Smokeless tobacco: Never  Vaping Use   Vaping Use: Never used  Substance Use Topics   Alcohol use: No    Alcohol/week: 0.0 standard drinks   Drug use: No     MEDICATIONS    Home Medication:  Current Outpatient Rx   Order #: [id number] Class: Normal   Order #: [id number] Class: Print   Order #: [id number] Class: Print   Order #: [id number] Class: Normal    Current Medication:  Current Facility-Administered Medications:    acetaminophen (TYLENOL) tablet 325-650 mg, 325-650 mg, Oral, Q6H PRN, Poggi, Marshall Cork, MD, 500 mg at 11/27/20 1515   albuterol (PROVENTIL) (2.5 MG/3ML) 0.083% nebulizer solution 2.5 mg, 2.5 mg, Nebulization, Q6H PRN, Noralee Space, RPH, 2.5 mg at 12/03/20 2219   atenolol (TENORMIN) tablet 12.5 mg, 12.5 mg, Oral, BID, Lanney Gins, Duy Lemming, MD, 12.5 mg at 12/05/20 7253   bisacodyl (DULCOLAX) suppository 10 mg, 10 mg, Rectal, Daily PRN, Poggi, Marshall Cork, MD   calcium carbonate (OS-CAL - dosed in mg of elemental calcium) tablet 500 mg of elemental calcium, 500 mg of elemental calcium, Oral, Q breakfast, Poggi, Marshall Cork, MD, 500 mg of elemental calcium at 12/05/20 0904   cholecalciferol (VITAMIN D3) tablet 2,000 Units, 2,000 Units, Oral,  Daily, Poggi, Marshall Cork, MD, 2,000 Units at 12/05/20 0903   citalopram (CELEXA) tablet 20 mg, 20 mg, Oral, QHS, Poggi, Marshall Cork, MD, 20 mg at 12/04/20 2212  clotrimazole (GYNE-LOTRIMIN) vaginal cream 1 Applicatorful, 1 Applicatorful, Vaginal, QHS, Lenus Trauger, MD, 1 Applicatorful at 30/86/57 2217   enoxaparin (LOVENOX) injection 40 mg, 40 mg, Subcutaneous, Q24H, Poggi, Marshall Cork, MD, 40 mg at 12/05/20 8469   HYDROcodone-acetaminophen (NORCO/VICODIN) 5-325 MG per tablet 1-2 tablet, 1-2 tablet, Oral, Q4H PRN, Poggi, Marshall Cork, MD, 1 tablet at 12/05/20 6295   magnesium hydroxide (MILK OF MAGNESIA) suspension 30 mL, 30 mL, Oral, Daily PRN, Poggi, Marshall Cork, MD, 30 mL at 12/04/20 1746   metroNIDAZOLE (FLAGYL) tablet 500 mg, 500 mg, Oral, Q12H, Ottie Glazier, MD, 500 mg at 12/05/20 2841   pantoprazole (PROTONIX) EC tablet 20 mg, 20 mg, Oral, Daily, Lanney Gins, Alisa Stjames, MD, 20 mg at 12/05/20 0904   polyethylene glycol (MIRALAX / GLYCOLAX) packet 17 g, 17 g, Oral, Daily, Sreenath, Sudheer B, MD, 17 g at 12/05/20 3244   predniSONE (DELTASONE) tablet 10 mg, 10 mg, Oral, Q breakfast, Ottie Glazier, MD, 10 mg at 12/05/20 0102   senna (SENOKOT) tablet 8.6 mg, 1 tablet, Oral, BID, Priscella Mann, Sudheer B, MD, 8.6 mg at 12/05/20 7253   triamterene-hydrochlorothiazide (MAXZIDE-25) 37.5-25 MG per tablet 1 tablet, 1 tablet, Oral, Daily, Poggi, Marshall Cork, MD, 1 tablet at 12/05/20 6644   valACYclovir (VALTREX) tablet 1,000 mg, 1,000 mg, Oral, Daily, Poggi, Marshall Cork, MD, 1,000 mg at 12/05/20 0347    ALLERGIES   Cytoxan [cyclophosphamide], Penicillin g, Taxotere [docetaxel], Other, Parabens, Shellac, Ciprofloxacin, Fluocinonide, Tape, and Triamcinolone     REVIEW OF SYSTEMS    Review of Systems:  Gen:  Denies  fever, sweats, chills weigh loss  HEENT: Denies blurred vision, double vision, ear pain, eye pain, hearing loss, nose bleeds, sore throat Cardiac:  No dizziness, chest pain or heaviness, chest tightness,edema Resp:    Denies cough or sputum porduction, shortness of breath,wheezing, hemoptysis,  Gi: Denies swallowing difficulty, stomach pain, nausea or vomiting, diarrhea, constipation, bowel incontinence Gu:  Denies bladder incontinence, burning urine Ext:   Denies Joint pain, stiffness or swelling Skin: Denies  skin rash, easy bruising or bleeding or hives Endoc:  Denies polyuria, polydipsia , polyphagia or weight change Psych:   Denies depression, insomnia or hallucinations   Other:  All other systems negative   VS: BP (!) 128/59   Pulse 62   Temp 98.5 F (36.9 C)   Resp 18   Ht 5' 6.5" (1.689 m)   Wt 73.9 kg   SpO2 93%   BMI 25.91 kg/m      PHYSICAL EXAM    GENERAL:NAD, no fevers, chills, no weakness no fatigue HEAD: Normocephalic, atraumatic.  EYES: Pupils equal, round, reactive to light. Extraocular muscles intact. No scleral icterus.  MOUTH: Moist mucosal membrane. Dentition intact. No abscess noted.  EAR, NOSE, THROAT: Clear without exudates. No external lesions.  NECK: Supple. No thyromegaly. No nodules. No JVD.  PULMONARY: clear to auscultation with reduced breath sounds on right.  CARDIOVASCULAR: S1 and S2. Regular rate and rhythm. No murmurs, rubs, or gallops. No edema. Pedal pulses 2+ bilaterally.  GASTROINTESTINAL: Soft, nontender, nondistended. No masses. Positive bowel sounds. No hepatosplenomegaly.  MUSCULOSKELETAL: No swelling, clubbing, or edema. Range of motion full in all extremities.  NEUROLOGIC: Cranial nerves II through XII are intact. No gross focal neurological deficits. Sensation intact. Reflexes intact.  SKIN: No ulceration, lesions, rashes, or cyanosis. Skin warm and dry. Turgor intact.  PSYCHIATRIC: Mood, affect within normal limits. The patient is awake, alert and oriented x 3. Insight, judgment intact.  IMAGING    DG Chest 2 View  Result Date: 12/01/2020 CLINICAL DATA:  Cough, shortness of breath, pulmonary edema, recent shoulder arthroplasty  EXAM: CHEST - 2 VIEW COMPARISON:  11/30/2020 FINDINGS: Unchanged examination with elevation of the right hemidiaphragm and bandlike scarring atelectasis. No acute appearing airspace opacity. Cardiomegaly. Status post right shoulder reverse arthroplasty. IMPRESSION: 1. Unchanged examination with elevation of the right hemidiaphragm and bandlike scarring or atelectasis. No acute appearing airspace opacity. 2.  Cardiomegaly. Electronically Signed   By: Eddie Candle M.D.   On: 12/01/2020 11:24   CT Angio Chest Pulmonary Embolism (PE) W or WO Contrast  Result Date: 12/01/2020 CLINICAL DATA:  PE suspected, high prob history of breast cancer in Sjogren's. EXAM: CT ANGIOGRAPHY CHEST WITH CONTRAST TECHNIQUE: Multidetector CT imaging of the chest was performed using the standard protocol during bolus administration of intravenous contrast. Multiplanar CT image reconstructions and MIPs were obtained to evaluate the vascular anatomy. CONTRAST:  79m OMNIPAQUE IOHEXOL 350 MG/ML SOLN COMPARISON:  None. FINDINGS: Cardiovascular: Evaluation is limited secondary to streak artifact and respiratory motion. Satisfactory opacification of the pulmonary arteries to the segmental level. No evidence of pulmonary embolism. Normal heart size. No pericardial effusion. Atherosclerotic calcifications of the aorta. Elevation of the RIGHT hemidiaphragm. Mediastinum/Nodes: No axillary or mediastinal adenopathy. Thyroid is unremarkable. Lungs/Pleura: Enhancing consolidative opacity of the RIGHT lower lobe, increased in comparison to prior and consistent with atelectasis. This involves approximately half of the RIGHT lower lobe. There is increased RIGHT middle lobe atelectasis. Scattered endobronchial debris and mild bronchial wall thickening. Upper Abdomen: Small hiatal hernia.  Status post cholecystectomy. Musculoskeletal: Status post LEFT mastectomy with reconstruction. Small foci of air noted in the RIGHT upper extremity soft tissues, likely  intravenous and iatrogenic in etiology. Status post RIGHT shoulder arthroplasty. Review of the MIP images confirms the above findings. IMPRESSION: 1. No evidence of pulmonary embolism. 2. Scattered areas of endobronchial debris with increased RIGHT lower lobe and RIGHT middle lobe atelectasis. Findings could be infectious or secondary to aspiration given presence of small hiatal hernia. Aortic Atherosclerosis (ICD10-I70.0). Electronically Signed   By: SValentino SaxonM.D.   On: 12/01/2020 19:59   DG Sniff Test  Result Date: 12/05/2020 CLINICAL DATA:  Diaphragmatic disorder. EXAM: CHEST FLUOROSCOPY TECHNIQUE: Real-time fluoroscopic evaluation of the chest was performed. FLUOROSCOPY TIME:  Fluoroscopy Time:  24 seconds Radiation Exposure Index (if provided by the fluoroscopic device): 1.80 mGy Number of Acquired Spot Images: None COMPARISON:  CT angiogram chest 12/01/2020. prior chest radiographs 12/01/2020 and earlier. FINDINGS: Fluoroscopic imaging acquired by KTsosie Billing PA. Chronically elevated right hemidiaphragm. Persistent right lung base opacity, which may reflect atelectasis and/or pneumonia. There is motion of the right hemidiaphragm with respiration, although with decreased excursion as compared to the left. No paradoxical motion of the right hemidiaphragm is noted. IMPRESSION: Chronically elevated right hemidiaphragm. There is motion of the right hemidiaphragm with respiration, although with decreased excursion as compared to the left. Findings suggest incomplete right diaphragmatic paralysis. Redemonstrated right lung base opacity, which may reflect atelectasis and/or pneumonia. Normal excursion of the left hemidiaphragm during respiration. Electronically Signed   By: KKellie SimmeringD.O.   On: 12/05/2020 11:25   DG Chest Port 1 View  Result Date: 11/30/2020 CLINICAL DATA:  New cough, wheezing, recent RIGHT shoulder replacement, history childhood asthma EXAM: PORTABLE CHEST 1 VIEW COMPARISON:   Portable exam 0818 hours compared to 11/28/2020 FINDINGS: Enlargement of cardiac silhouette. Mediastinal contours and pulmonary vascularity normal. Persistent elevation of RIGHT diaphragm  with RIGHT basilar atelectasis. Remaining lungs clear. No definite infiltrate, pleural effusion, or pneumothorax. Bones demineralized with note of a RIGHT shoulder prosthesis. IMPRESSION: Elevation of RIGHT diaphragm with RIGHT basilar atelectasis. Enlargement of cardiac silhouette. Electronically Signed   By: Lavonia Dana M.D.   On: 11/30/2020 11:56   DG Chest Port 1 View  Result Date: 11/28/2020 CLINICAL DATA:  Chest congestion EXAM: PORTABLE CHEST 1 VIEW COMPARISON:  08/13/2020 FINDINGS: Cardiac enlargement and aortic atherosclerosis. Chronic asymmetric elevation of the right hemidiaphragm with overlying subsegmental atelectasis versus scarring. No pleural effusion or edema. No airspace consolidation. Previous right shoulder arthroplasty. IMPRESSION: Chronic asymmetric elevation of the right hemidiaphragm with overlying subsegmental atelectasis versus scarring. Electronically Signed   By: Kerby Moors M.D.   On: 11/28/2020 06:10   DG Shoulder Right Port  Result Date: 11/27/2020 CLINICAL DATA:  Status post reverse right shoulder arthroplasty EXAM: PORTABLE RIGHT SHOULDER COMPARISON:  None. FINDINGS: Status post right shoulder reverse total arthroplasty with expected overlying postoperative change. No evidence of perihardware fracture or component malposition. IMPRESSION: Status post right shoulder reverse total arthroplasty with expected overlying postoperative change. No evidence of perihardware fracture or component malposition. Electronically Signed   By: Eddie Candle M.D.   On: 11/27/2020 10:49   [place] OR NERVE BLOCK-IMAGE ONLY [facility])  Result Date: 11/27/2020 There is no interpretation for this exam.  This order is for images obtained during a surgical procedure.  Please See "Surgeries" Tab for more information  regarding the procedure.   US LIVER DOPPLER  Result Date: 12/02/2020 CLINICAL DATA:  History of NASH.  Evaluate hepatic vasculature. EXAM: DUPLEX ULTRASOUND OF LIVER TECHNIQUE: Color and duplex Doppler ultrasound was performed to evaluate the hepatic in-flow and out-flow vessels. COMPARISON:  Chest CT-12/01/2020 FINDINGS: Liver: There is diffuse increased slightly coarsened echogenicity of the hepatic parenchyma suggestive of hepatic steatosis. No discrete lesions are identified. No intrahepatic biliary ductal dilatation. Spleen: Normal in size measuring 9 11.1 x 9.7 x 4.4 cm in diameter (calculated volume of 244 cc) Portal Vein Velocities (normal hepatopetal directional flow) Main:  40 cm/sec Right:  14 cm/sec Left:  12 cm/sec Hepatic Vein Velocities (normal hepatofugal directional flow) Right:  20 cm/sec Middle:  21 cm/sec Left:  23 cm/sec Hepatic Artery Velocity:  86/21 cm/sec Splenic Vein Velocity:  19 cm/sec Varices: None visualized Ascites: None visualized IMPRESSION: 1. Findings suggestive of [place] hepatic steatosis. No discrete hepatic lesions are identified though further evaluation with abdominal MRI could be performed as indicated. 2. Patent hepatic vasculature with normal directional flow. 3. No evidence of portal venous hypertension. Specifically, no evidence of splenomegaly or ascites. Electronically Signed   By: Sandi Mariscal M.D.   On: 12/02/2020 14:16          ASSESSMENT/PLAN   Acute cough and labored breathing- RESOLVED     - atelectasis bilaterally worse on right- CT chest with debris in right lung consistent with aspiration which patient admits has been a recurrent issue     - right hemidiaphragm dysfunction      -no consolidation /pneumonia at this moment    - stopped IVF at 75cc/h     - lasix 40 daily IV- change to PO 67m lasix    - prednisone 40 daily PO>>reduce by 161mdaily     - Metaneb with Albuterol Q8h     -Reduce Atenelol to 12.5 BID   -DC morphine and  dihenhydramine due to worsening of atelectasis, continue PO percoset for analgesia, may add tylenol  and toradol if patient needs more pain control   -DC gabapenting   - prednisone DCD  hemidiaphragm elevation   - Sniff fluoroscopy study- shows both sides are active with reduced motion on right likely due to fatty liver disease  - supine and upright PFT to elucidate severity of defect on outpatient     -due to NASH found on liver US  Hepatomegaly    - 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     - CT chest with endobronchial debris and RLL atelectasis consistent with aspiration     -patient is allergic to few different classes of antibiotics including PCN and Flouroquinolones , will start with Flagyl and can modify per pharmD recommendations                DC home with 3 more day of flagyl and outpatient pulm follow up in 3 months    Thank you for allowing me to participate in the care of this patient.   Patient/Family are satisfied with care plan and all questions have been answered.  This document was prepared using Dragon voice recognition software and may include unintentional dictation errors.     Ottie Glazier, M.D.  Division of [place]

## 2020-12-06 NOTE — Progress Notes (Signed)
Pt discharged to [facility].  AVS and prescriptions placed in packet to be delivered to facility.  All belongings at bedside taken with pt.  Pt transported to facility by Daughter.  Report called to Wendy Poet, LPN

## 2020-12-06 NOTE — Care Management Important Message (Signed)
Important Message  Patient Details  Name: Mariah Hogan MRN: [id number] Date of Birth: 1950/03/23   Medicare Important Message Given:  No  Patient discharged prior to arrival to unit to deliver concurrent Medicare IM.     Dannette Barbara 12/06/2020, 2:49 PM

## 2020-12-06 NOTE — Progress Notes (Signed)
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  PMH includes: HTN, anemia, skin CA, and Left breast CA.    PT Comments    Pt in recliner, she is able to walk 100' in hall with [facility] and min guard with self initiated rest breaks.  Elbow writs and hand ROM and stretching with elbow supported.  Increasing elbow ROM today.   Recommendations for follow up therapy are one component of a multi-disciplinary discharge planning process, led by the attending physician.  Recommendations may be updated based on patient status, additional functional criteria and insurance authorization.  Follow Up Recommendations  SNF;Supervision for mobility/OOB     Equipment Recommendations  None recommended by PT;Other (comment)    Recommendations for Other Services       Precautions / Restrictions Precautions Precautions: Shoulder Shoulder Interventions: Shoulder sling/immobilizer;At all times;Off for dressing/bathing/exercises;Shoulder abduction pillow Precaution Comments: required mod-max verbal cues to maintain shoulder precautions during bathing/dressing tasks Restrictions Weight Bearing Restrictions: Yes RUE Weight Bearing: Non weight bearing     Mobility  Bed Mobility               General bed mobility comments: in recliner before and after    Transfers Overall transfer level: Needs assistance Equipment used: None Transfers: Sit to/from Stand              Ambulation/Gait Ambulation/Gait assistance: Min guard Gait Distance (Feet): 100 Feet Assistive device: Quad cane Gait Pattern/deviations: Step-through pattern;Decreased stride length Gait velocity: significantly decreased       Stairs              Wheelchair Mobility    Modified Rankin (Stroke Patients Only)       Balance Overall balance assessment: Needs assistance Sitting-balance support: No upper extremity supported;Feet supported Sitting balance-Leahy Scale: Good     Standing balance support: No upper extremity supported;During functional activity Standing balance-Leahy Scale: Good Standing balance comment: able to maintain standing balance while doffing pants (pulled them off ankles while in sitting) while using no AD                            Cognition Arousal/Alertness: Awake/alert Behavior During Therapy: WFL for tasks assessed/performed Overall Cognitive Status: Within Functional Limits for tasks assessed                                        Exercises Other Exercises Other Exercises: hand wrist and elbow ex and AAROM/stretching elbow    General Comments        Pertinent Vitals/Pain Pain Assessment: Faces Faces Pain Scale: Hurts little more Pain Location: R shoulder - with elbow ROM Pain Descriptors / Indicators: Sore;Aching;Grimacing Pain Intervention(s): Limited activity within patient's tolerance;Monitored during session;Premedicated before session;Ice applied    Home Living                      Prior Function            PT Goals (current goals can now be found in the care plan section) Progress towards PT goals: Progressing toward goals    Frequency  7X/week      PT Plan Current plan remains appropriate    Co-evaluation              AM-PAC PT "6 Clicks" Mobility   Outcome Measure  Help needed turning from your back to your side while in a flat bed without using bedrails?: None Help needed moving from lying on your back to sitting on the side of a flat bed without using bedrails?: None Help needed moving to and from a bed to a chair (including a wheelchair)?: A Little Help needed standing up from a chair using your arms (e.g.,  wheelchair or bedside chair)?: A Little Help needed to walk in hospital room?: A Little Help needed climbing 3-5 steps with a railing? : A Little 6 Click Score: 20    End of Session Equipment Utilized During Treatment: Gait belt Activity Tolerance: Patient tolerated treatment well Patient left: in chair;with call bell/phone within reach Nurse Communication: Mobility status PT Visit Diagnosis: Unsteadiness on feet (R26.81);Difficulty in walking, not elsewhere classified (R26.2);Muscle weakness (generalized) (M62.81)     Time: 2956-2130 PT Time Calculation (min) (ACUTE ONLY): 14 min  Charges:  $Gait Training: 8-22 mins                    Chesley Noon, PTA 12/06/20, 9:59 AM

## 2020-12-06 NOTE — TOC Progression Note (Signed)
Transition of Care [facility]) - Progression Note    Patient Details  Name: Mariah Hogan MRN: [id number] Date of Birth: Aug 26, 1950  [facility]) CM/SW [place], RN Phone Number: 12/06/2020, 9:58 AM  Clinical Narrative:   The patient to DC today to [facility], Her daughter will transport at around 130 this afternoon, She has had 2 Covid vaccines and 1 booster and has had the Flu vaccine this Visit, DC summary sent to [facility], The bedside nurse to call report to [phone]         Expected Discharge Plan and Services           Expected Discharge Date: 12/05/20                                     Social Determinants of Health (SDOH) Interventions    Readmission Risk Interventions No flowsheet data found.

## 2020-12-06 NOTE — Progress Notes (Signed)
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  PMH includes: HTN, anemia, skin CA, and Left breast CA.   OT comments  Ms. Vantassel was seen for OT treatment on this date. Upon arrival to room pt seated EOB eager to participate. Pt required SETUP + SUP don pants in sitting, SUP for standing portion. MOD A don/doff sling/shirt sitting EOB. SETUP + VCs to maintain pcns for upper body bathing sitting EOB. MAX A don/doff polar care sitting EOB.  Pt verbalized understanding of instruction provided. Pt making good progress toward goals. Pt continues to benefit from skilled OT services to maximize return to PLOF and minimize risk of future falls, injury, caregiver burden, and readmission. Will continue to follow POC. Discharge recommendation remains appropriate.     Recommendations for follow up therapy are one component of a multi-disciplinary discharge planning process, led by the attending physician.  Recommendations may be updated based on patient status, additional functional criteria and insurance authorization.    Follow Up Recommendations  SNF;Follow surgeon's recommendation for DC plan and follow-up therapies    Equipment Recommendations  3 in 1 bedside commode    Recommendations for Other Services      Precautions / Restrictions Precautions Precautions: Shoulder Shoulder Interventions: Shoulder sling/immobilizer;At all times;Off for dressing/bathing/exercises;Shoulder abduction pillow Restrictions Weight Bearing Restrictions: Yes RUE Weight Bearing: Non weight bearing       Mobility Bed Mobility Overal bed mobility: Modified Independent              Transfers Overall transfer level: Needs  assistance Equipment used: Quad cane Transfers: Sit to/from Stand Sit to Stand: Supervision              Balance Overall balance assessment: Needs assistance Sitting-balance support: No upper extremity supported;Feet supported Sitting balance-Leahy Scale: Normal     Standing balance support: No upper extremity supported;During functional activity Standing balance-Leahy Scale: Good Standing balance comment: Donning pants while standing                           ADL either performed or assessed with clinical judgement   ADL Overall ADL's : Needs assistance/impaired                                       General ADL Comments: SETUP + SUP don pants in sitting, SUP for standing portion. MOD A don/doff sling/shirt sitting EOB. SETUP + VCs to maintain pcns for upper body bathing sitting EOB. MAX A don/doff polar care sitting EOB.      Cognition Arousal/Alertness: Awake/alert Behavior During Therapy: WFL for tasks assessed/performed Overall Cognitive Status: Within Functional Limits for tasks assessed                                          Exercises Exercises: Other exercises Other Exercises Other Exercises: Pt. educated re: polar care mgmt, adapted dressing techniques, falls prevention, functional application of NWB pcns Other Exercises: LBD, UBD, grooming, sit<>stand, sitting/standing balance/tolerance  Pertinent Vitals/ Pain       Pain Assessment: No/denies pain Faces Pain Scale: Hurts little more Pain Location: R shoulder - with elbow ROM Pain Descriptors / Indicators: Sore;Aching;Grimacing Pain Intervention(s): Limited activity within patient's tolerance;Monitored during session;Premedicated before session;Ice applied   Frequency  Min 1X/week        Progress Toward Goals  OT Goals(current goals can now be found in the care plan section)  Progress towards OT goals: Progressing toward  goals  Acute Rehab OT Goals Patient Stated Goal: To be able to cook and care for myself OT Goal Formulation: With patient Time For Goal Achievement: 12/12/20 Potential to Achieve Goals: Good ADL Goals Pt Will Perform Grooming: standing;with modified independence Pt Will Perform Upper Body Dressing: sitting;with set-up;with supervision Pt Will Perform Lower Body Dressing: with modified independence;sit to/from stand  Plan Discharge plan remains appropriate;Frequency remains appropriate    Co-evaluation                 AM-PAC OT "6 Clicks" Daily Activity     Outcome Measure   Help from another person eating meals?: A Little Help from another person taking care of personal grooming?: A Little Help from another person toileting, which includes using toliet, bedpan, or urinal?: None Help from another person bathing (including washing, rinsing, drying)?: A Lot Help from another person to put on and taking off regular upper body clothing?: A Lot Help from another person to put on and taking off regular lower body clothing?: A Little 6 Click Score: 17    End of Session    OT Visit Diagnosis: Other abnormalities of gait and mobility (R26.89);Muscle weakness (generalized) (M62.81)   Activity Tolerance Patient tolerated treatment well   Patient Left in bed;with call bell/phone within reach   Nurse Communication          Time: 6160-7371 OT Time Calculation (min): 31 min  Charges: OT Treatments $Self Care/Home Management : 23-37 mins  Nino Glow, OTS

## 2020-12-22 ENCOUNTER — Ambulatory Visit: Admit: 2020-12-22 | Payer: Medicare Other | Admitting: Surgery

## 2020-12-22 SURGERY — OPEN REDUCTION INTERNAL FIXATION (ORIF) TIBIA FRACTURE
Anesthesia: Choice | Laterality: Left

## 2021-01-22 ENCOUNTER — Other Ambulatory Visit: Payer: Self-pay | Admitting: Pulmonary Disease

## 2021-01-22 ENCOUNTER — Other Ambulatory Visit (HOSPITAL_COMMUNITY): Payer: Self-pay | Admitting: Pulmonary Disease

## 2021-01-22 DIAGNOSIS — K219 Gastro-esophageal reflux disease without esophagitis: Secondary | ICD-10-CM

## 2021-02-04 ENCOUNTER — Ambulatory Visit
Admission: RE | Admit: 2021-02-04 | Discharge: 2021-02-04 | Disposition: A | Discharge status: Home / Self Care | Payer: Medicare Other | Source: Ambulatory Visit | Attending: Pulmonary Disease | Admitting: Pulmonary Disease

## 2021-02-04 DIAGNOSIS — K219 Gastro-esophageal reflux disease without esophagitis: Secondary | ICD-10-CM | POA: Insufficient documentation

## 2021-05-28 ENCOUNTER — Ambulatory Visit: Payer: Medicare Other | Admitting: Plastic Surgery

## 2021-06-11 ENCOUNTER — Other Ambulatory Visit: Payer: Self-pay | Admitting: Dermatology

## 2021-06-11 DIAGNOSIS — B009 Herpesviral infection, unspecified: Secondary | ICD-10-CM

## 2021-06-12 ENCOUNTER — Ambulatory Visit (INDEPENDENT_AMBULATORY_CARE_PROVIDER_SITE_OTHER): Payer: Medicare Other | Admitting: Dermatology

## 2021-06-12 DIAGNOSIS — M3500 Sicca syndrome, unspecified: Secondary | ICD-10-CM

## 2021-06-12 DIAGNOSIS — Z85828 Personal history of other malignant neoplasm of skin: Secondary | ICD-10-CM

## 2021-06-12 DIAGNOSIS — Z86018 Personal history of other benign neoplasm: Secondary | ICD-10-CM | POA: Diagnosis not present

## 2021-06-12 DIAGNOSIS — L2089 Other atopic dermatitis: Secondary | ICD-10-CM

## 2021-06-12 DIAGNOSIS — L308 Other specified dermatitis: Secondary | ICD-10-CM

## 2021-06-12 DIAGNOSIS — L814 Other melanin hyperpigmentation: Secondary | ICD-10-CM

## 2021-06-12 DIAGNOSIS — L820 Inflamed seborrheic keratosis: Secondary | ICD-10-CM | POA: Diagnosis not present

## 2021-06-12 DIAGNOSIS — Z8619 Personal history of other infectious and parasitic diseases: Secondary | ICD-10-CM

## 2021-06-12 DIAGNOSIS — B009 Herpesviral infection, unspecified: Secondary | ICD-10-CM

## 2021-06-12 DIAGNOSIS — L821 Other seborrheic keratosis: Secondary | ICD-10-CM

## 2021-06-12 DIAGNOSIS — D1800 Hemangioma unspecified site: Secondary | ICD-10-CM

## 2021-06-12 DIAGNOSIS — Z1283 Encounter for screening for malignant neoplasm of skin: Secondary | ICD-10-CM | POA: Diagnosis not present

## 2021-06-12 DIAGNOSIS — L578 Other skin changes due to chronic exposure to nonionizing radiation: Secondary | ICD-10-CM

## 2021-06-12 DIAGNOSIS — D229 Melanocytic nevi, unspecified: Secondary | ICD-10-CM

## 2021-06-12 DIAGNOSIS — Z853 Personal history of malignant neoplasm of breast: Secondary | ICD-10-CM

## 2021-06-12 MED ORDER — EUCRISA 2 % EX OINT: 1.0000 "application " | TOPICAL_OINTMENT | Freq: Two times a day (BID) | CUTANEOUS | 6 refills | Status: DC

## 2021-06-12 MED ORDER — DESOXIMETASONE 0.25 % EX CREA: 1.0000 "application " | TOPICAL_CREAM | Freq: Two times a day (BID) | CUTANEOUS | 11 refills | Status: DC

## 2021-06-12 MED ORDER — VALACYCLOVIR HCL 1 G PO TABS: 1000.0000 mg | ORAL_TABLET | ORAL | 11 refills | Status: DC

## 2021-06-12 NOTE — Progress Notes (Signed)
? ?Follow-Up Visit ?  ?Subjective  ?Mariah Hogan is a 71 y.o. female who presents for the following: Annual Exam (History of BCC and dysplastic nevus - TBSE today). ?The patient presents for Total-Body Skin Exam (TBSE) for skin cancer screening and mole check.  The patient has spots, moles and lesions to be evaluated, some may be new or changing and the patient has concerns that these could be cancer. ? ?The following portions of the chart were reviewed this encounter and updated as appropriate:  ? Tobacco  Allergies  Meds  Problems  Med Hx  Surg Hx  Fam Hx   ?  ?Review of Systems:  No other skin or systemic complaints except as noted in HPI or Assessment and Plan. ? ?Objective  ?Well appearing patient in no apparent distress; mood and affect are within normal limits. ? ?A full examination was performed including scalp, head, eyes, ears, nose, lips, neck, chest, axillae, abdomen, back, buttocks, bilateral upper extremities, bilateral lower extremities, hands, feet, fingers, toes, fingernails, and toenails. All findings within normal limits unless otherwise noted below. ? ?Clear today ? ?Back (6) ?Erythematous stuck-on, waxy papule or plaque ? ?Left Breast ?No lymphadenopathy ? ? ?Assessment & Plan  ? ?History of Basal Cell Carcinoma of the Skin ?- No evidence of recurrence today ?- Recommend regular full body skin exams ?- Recommend daily broad spectrum sunscreen SPF 30+ to sun-exposed areas, reapply every 2 hours as needed.  ?- Call if any new or changing lesions are noted between office visits ? ?History of Dysplastic Nevi ?- No evidence of recurrence today ?- Recommend regular full body skin exams ?- Recommend daily broad spectrum sunscreen SPF 30+ to sun-exposed areas, reapply every 2 hours as needed.  ?- Call if any new or changing lesions are noted between office visits ? ?Lentigines ?- Scattered tan macules ?- Due to sun exposure ?- Benign-appearing, observe ?- Recommend daily broad spectrum  sunscreen SPF 30+ to sun-exposed areas, reapply every 2 hours as needed. ?- Call for any changes ? ?Seborrheic Keratoses ?- Stuck-on, waxy, tan-brown papules and/or plaques  ?- Benign-appearing ?- Discussed benign etiology and prognosis. ?- Observe ?- Call for any changes ? ?Melanocytic Nevi ?- Tan-brown and/or pink-flesh-colored symmetric macules and papules ?- Benign appearing on exam today ?- Observation ?- Call clinic for new or changing moles ?- Recommend daily use of broad spectrum spf 30+ sunscreen to sun-exposed areas.  ? ?Hemangiomas ?- Red papules ?- Discussed benign nature ?- Observe ?- Call for any changes ? ?Actinic Damage ?- Chronic condition, secondary to cumulative UV/sun exposure ?- diffuse scaly erythematous macules with underlying dyspigmentation ?- Recommend daily broad spectrum sunscreen SPF 30+ to sun-exposed areas, reapply every 2 hours as needed.  ?- Staying in the shade or wearing long sleeves, sun glasses (UVA+UVB protection) and wide brim hats (4-inch brim around the entire circumference of the hat) are also recommended for sun protection.  ?- Call for new or changing lesions. ? ?Skin cancer screening performed today. ? ?Inflamed seborrheic keratosis (6) ?Back ? ?Destruction of lesion - Back ?Complexity: simple   ?Destruction method: cryotherapy   ?Informed consent: discussed and consent obtained   ?Timeout:  patient name, date of birth, surgical site, and procedure verified ?Lesion destroyed using liquid nitrogen: Yes   ?Region frozen until ice ball extended beyond lesion: Yes   ?Outcome: patient tolerated procedure well with no complications   ?Post-procedure details: wound care instructions given   ? ?Other atopic dermatitis ?Legs ?Exacerbated by  Sjogren's ?Eucrisa oint bid to affected areas ?Continue Desoximetasone 0.25% cream qd to severe areas ? ?Atopic dermatitis (eczema) is a chronic, relapsing, pruritic condition that can significantly affect quality of life. It is often  associated with allergic rhinitis and/or asthma and can require treatment with topical medications, phototherapy, or in severe cases biologic injectable medication (Dupixent; Adbry) or Oral JAK inhibitors. ? ?Crisaborole (EUCRISA) 2 % OINT - Legs ?Apply 1 application. topically 2 (two) times daily. ? ?History of breast cancer ?Left Breast ?Clear. Observe for recurrence.  No lymphadenopathy.  Call clinic for new or changing lesions.  Recommend regular skin exams, daily broad-spectrum spf 30+ sunscreen use, and photoprotection.   ? ?History of herpes simplex infection ?Herpes simplex ?Herpes Simplex Virus = Cold Sores = Fever Blisters is a chronic recurring blistering; scabbing sore-producing viral infection that is recurrent usually in the same area triggered by stress, sun/UV exposure and trauma.  It is infectious and can be spread from person to person by direct contact.  It is not curable, but is treatable with topical and oral medication. ? ?Related Medications ?valACYclovir (VALTREX) 1000 MG tablet ?Take 1 tablet (1,000 mg total) by mouth as directed. ? ?Related Medications ?desoximetasone (TOPICORT) 0.25 % cream ?Apply 1 application. topically 2 (two) times daily. ? ?Skin cancer screening ? ?Return in about 1 year (around 06/13/2022) for TBSE. ? ?I, Ashok Cordia, CMA, am acting as scribe for Sarina Ser, MD . ?Documentation: I have reviewed the above documentation for accuracy and completeness, and I agree with the above. ? ?Sarina Ser, MD ? ?

## 2021-06-12 NOTE — Patient Instructions (Signed)

## 2021-06-13 ENCOUNTER — Encounter: Payer: Self-pay | Admitting: Dermatology

## 2021-06-18 ENCOUNTER — Telehealth: Payer: Self-pay

## 2021-06-18 MED ORDER — TACROLIMUS 0.1 % EX OINT: TOPICAL_OINTMENT | Freq: Two times a day (BID) | CUTANEOUS | 6 refills | Status: DC

## 2021-06-18 NOTE — Telephone Encounter (Signed)
RX sent in and patient advised of medication change. aw ?

## 2021-06-18 NOTE — Telephone Encounter (Signed)
Mariah Hogan denied due to non formulary for her [place] coverage.  ?

## 2021-09-18 ENCOUNTER — Other Ambulatory Visit: Payer: Self-pay | Admitting: General Surgery

## 2021-09-18 DIAGNOSIS — Z1231 Encounter for screening mammogram for malignant neoplasm of breast: Secondary | ICD-10-CM

## 2021-10-01 ENCOUNTER — Other Ambulatory Visit: Payer: Self-pay | Admitting: Student

## 2021-10-01 DIAGNOSIS — R251 Tremor, unspecified: Secondary | ICD-10-CM

## 2021-10-05 ENCOUNTER — Ambulatory Visit
Admission: RE | Admit: 2021-10-05 | Discharge: 2021-10-05 | Disposition: A | Discharge status: Home / Self Care | Payer: Medicare Other | Source: Ambulatory Visit | Attending: Student | Admitting: Student

## 2021-10-05 DIAGNOSIS — R251 Tremor, unspecified: Secondary | ICD-10-CM | POA: Insufficient documentation

## 2021-10-23 ENCOUNTER — Ambulatory Visit
Admission: RE | Admit: 2021-10-23 | Discharge: 2021-10-23 | Disposition: A | Discharge status: Home / Self Care | Payer: Medicare Other | Source: Ambulatory Visit | Attending: General Surgery | Admitting: General Surgery

## 2021-10-23 DIAGNOSIS — Z1231 Encounter for screening mammogram for malignant neoplasm of breast: Secondary | ICD-10-CM | POA: Diagnosis present

## 2021-12-07 ENCOUNTER — Emergency Department: Payer: Medicare Other

## 2021-12-07 ENCOUNTER — Encounter: Payer: Self-pay | Admitting: Emergency Medicine

## 2021-12-07 ENCOUNTER — Emergency Department
Admission: EM | Admit: 2021-12-07 | Discharge: 2021-12-07 | Disposition: A | Discharge status: Home / Self Care | Payer: Medicare Other | Attending: Student in an Organized Health Care Education/Training Program | Admitting: Student in an Organized Health Care Education/Training Program

## 2021-12-07 ENCOUNTER — Other Ambulatory Visit: Payer: Self-pay

## 2021-12-07 DIAGNOSIS — W19XXXA Unspecified fall, initial encounter: Secondary | ICD-10-CM | POA: Diagnosis not present

## 2021-12-07 DIAGNOSIS — M549 Dorsalgia, unspecified: Secondary | ICD-10-CM

## 2021-12-07 DIAGNOSIS — I10 Essential (primary) hypertension: Secondary | ICD-10-CM | POA: Diagnosis not present

## 2021-12-07 DIAGNOSIS — M5489 Other dorsalgia: Secondary | ICD-10-CM | POA: Insufficient documentation

## 2021-12-07 DIAGNOSIS — J45909 Unspecified asthma, uncomplicated: Secondary | ICD-10-CM | POA: Insufficient documentation

## 2021-12-07 DIAGNOSIS — M79672 Pain in left foot: Secondary | ICD-10-CM | POA: Diagnosis not present

## 2021-12-07 DIAGNOSIS — S0033XA Contusion of nose, initial encounter: Secondary | ICD-10-CM | POA: Diagnosis not present

## 2021-12-07 DIAGNOSIS — M25561 Pain in right knee: Secondary | ICD-10-CM | POA: Insufficient documentation

## 2021-12-07 DIAGNOSIS — R519 Headache, unspecified: Secondary | ICD-10-CM | POA: Diagnosis not present

## 2021-12-07 DIAGNOSIS — Z853 Personal history of malignant neoplasm of breast: Secondary | ICD-10-CM | POA: Insufficient documentation

## 2021-12-07 DIAGNOSIS — S0992XA Unspecified injury of nose, initial encounter: Secondary | ICD-10-CM | POA: Diagnosis present

## 2021-12-07 MED ORDER — METHOCARBAMOL 500 MG PO TABS: 500.0000 mg | ORAL_TABLET | Freq: Three times a day (TID) | ORAL | 0 refills | Status: DC | PRN

## 2021-12-07 NOTE — ED Triage Notes (Signed)
Pt via POV from home. Pt had a mechanical fall today, she tripped over a cord. States that she fell on her face onto carpet. Denies LOC. Denies blood thinners. Pt c/o nose pain, R knee pain, lower back pain, and L 2nd and 3rd toe pain.  Pt is ambulatory to desk. Pt is A&Ox4 and NAD

## 2021-12-07 NOTE — Discharge Instructions (Addendum)
Please follow-up with your primary care provider or orthopedics for persistent neck, back, knee, or foot pain.  Be aware the robaxin will likely make you drowsy.  Return to the ER for concerning symptoms if unable to see Primary Care.

## 2021-12-07 NOTE — ED Provider Notes (Signed)
[facility] Provider Note    Event Date/Time   First MD Initiated Contact with Patient 12/07/21 1357     (approximate)   History   Fall   HPI Mariah Hogan is a 71 y.o. female with history of breast cancer, osteoarthritis, hypokalemia, hypertension, asthma, and remaining history as listed in EMR presents to the emergency department after she sustained a mechanical, nonsyncopal fall at home earlier today.  Patient states that they were having a birthday party for her grandchild and a cord got moved.  She was not aware that it was there and caught her foot and fell forward.  She landed face first on the carpet and subsequently had a nosebleed, left foot pain and right knee pain.  She has since developed diffuse back pain as well.  She denies experiencing any loss of consciousness.  She is not currently on blood thinners or daily aspirin.     Physical Exam   Triage Vital Signs: ED Triage Vitals  Enc Vitals Group     BP 12/07/21 1310 (!) 133/108     Pulse Rate 12/07/21 1310 72     Resp 12/07/21 1320 18     Temp 12/07/21 1310 99.3 F (37.4 C)     Temp Source 12/07/21 1310 Oral     SpO2 12/07/21 1310 97 %     Weight 12/07/21 1253 160 lb (72.6 kg)     Height 12/07/21 1253 5' 7"  (1.702 m)     Head Circumference --      [facility] Flow --      Pain Score --      Pain Loc --      Pain Edu? --      Excl. in [place]? --     Most recent vital signs: Vitals:   12/07/21 1310 12/07/21 1320  BP: (!) 133/108   Pulse: 72   Resp:  18  Temp: 99.3 F (37.4 C)   SpO2: 97%      General: Awake, no distress.  CV:  Good peripheral perfusion.  Resp:  Normal effort.  Abd:  No distention.  Other:  Early ecchymosis over the bridge of the nose.  No active bleeding.   Pain over the second through fourth toe of the left foot.  Tenderness over the prepatellar surface of the right knee.  Diffuse tenderness of the back without focal vertebral tenderness.   ED Results /  Procedures / Treatments   Labs (all labs ordered are listed, but only abnormal results are displayed) Labs Reviewed - No data to display   EKG  Not indicated   RADIOLOGY  Images of the head, cervical spine, maxillofacial bones, thoracic spine, lumbar spine, right knee, and left foot interpreted and viewed by me: No acute bony abnormality identified.  Radiology report consistent with the same.  PROCEDURES:  Critical Care performed: No  Procedures   MEDICATIONS ORDERED IN ED: Medications - No data to display   IMPRESSION / MDM / [place] / ED COURSE  I reviewed the triage vital signs and the nursing notes.                              Differential diagnosis includes, but is not limited to, facial fracture, nasal bone fracture, cervical vertebral fracture, thoracic or lumbar spine injury, patella fracture, knee strain, foot contusion, metatarsal fracture.  Patient's presentation is most consistent with acute presentation with potential  threat to life or bodily function.  71 year old female presenting to the emergency department after mechanical, nonsyncopal fall at home earlier today.  See HPI for further details.  Exam is overall reassuring.  Images of the maxillofacial bones, head, and cervical spine were obtained prior to ER room assignment.  Results are pending.  In addition to these images, x-rays ordered. Clinical Course as of 12/07/21 1607  Sat Dec 07, 2021  1605 Imaging is all reassuring.  Results were discussed with the patient and daughter.  Home care instructions discussed as well.  She will be encouraged to follow-up with primary care or orthopedics if musculoskeletal pain is not improving over the next several days.  She will take Tylenol or ibuprofen, use ice, and Robaxin if needed.  She is to return to the emergency department for concerns if unable to see primary care or the orthopedist. [CT]    Clinical Course User Index [CT] Kobey Sides B, FNP      FINAL CLINICAL IMPRESSION(S) / ED DIAGNOSES   Final diagnoses:  Nasal contusion  Acute pain of right knee  Other acute back pain  Acute foot pain, left     Rx / DC Orders   ED Discharge Orders          Ordered    methocarbamol (ROBAXIN) 500 MG tablet  Every 8 hours PRN        12/07/21 1559             Note:  This document was prepared using Dragon voice recognition software and may include unintentional dictation errors.   Victorino Dike, FNP 12/07/21 1607    Merlyn Lot, MD 12/07/21 1640

## 2022-01-21 ENCOUNTER — Other Ambulatory Visit: Payer: Self-pay | Admitting: General Surgery

## 2022-01-21 DIAGNOSIS — N644 Mastodynia: Secondary | ICD-10-CM

## 2022-02-05 ENCOUNTER — Ambulatory Visit
Admission: RE | Admit: 2022-02-05 | Discharge: 2022-02-05 | Disposition: A | Discharge status: Home / Self Care | Payer: Medicare Other | Source: Ambulatory Visit | Attending: General Surgery | Admitting: General Surgery

## 2022-02-05 DIAGNOSIS — N644 Mastodynia: Secondary | ICD-10-CM

## 2022-04-26 LAB — AMB RESULTS CONSOLE CBG: Glucose: 111

## 2022-05-01 ENCOUNTER — Encounter: Payer: Self-pay | Admitting: Hematology and Oncology

## 2022-05-05 ENCOUNTER — Encounter: Payer: Self-pay | Admitting: *Deleted

## 2022-05-05 NOTE — Progress Notes (Signed)
Pt has established PCP, Dr. Paulita Cradle,  with whom she has had ongoing care throughout the past rolling 12 months, and w/ whom she had a complete physical exam on 11/27/21. 04/26/22 screening event POCT blood sugar wnl and no SDOH barriers noted as of this event. No additional health equity team support indicated at this time.

## 2022-05-08 ENCOUNTER — Ambulatory Visit (INDEPENDENT_AMBULATORY_CARE_PROVIDER_SITE_OTHER): Payer: Medicare Other | Admitting: Dermatology

## 2022-05-08 ENCOUNTER — Encounter: Payer: Self-pay | Admitting: Hematology and Oncology

## 2022-05-08 DIAGNOSIS — L821 Other seborrheic keratosis: Secondary | ICD-10-CM

## 2022-05-08 DIAGNOSIS — L820 Inflamed seborrheic keratosis: Secondary | ICD-10-CM | POA: Diagnosis not present

## 2022-05-08 DIAGNOSIS — L578 Other skin changes due to chronic exposure to nonionizing radiation: Secondary | ICD-10-CM | POA: Diagnosis not present

## 2022-05-08 NOTE — Patient Instructions (Signed)
Due to recent changes in healthcare laws, you may see results of your pathology and/or laboratory studies on MyChart before the doctors have had a chance to review them. We understand that in some cases there may be results that are confusing or concerning to you. Please understand that not all results are received at the same time and often the doctors may need to interpret multiple results in order to provide you with the best plan of care or course of treatment. Therefore, we ask that you please give us 2 business days to thoroughly review all your results before contacting the office for clarification. Should we see a critical lab result, you will be contacted sooner.   If You Need Anything After Your Visit  If you have any questions or concerns for your doctor, please call our main line at [phone] and press option 4 to reach your doctor's medical assistant. If no one answers, please leave a voicemail as directed and we will return your call as soon as possible. Messages left after 4 pm will be answered the following business day.   You may also send us a message via MyChart. We typically respond to MyChart messages within 1-2 business days.  For prescription refills, please ask your pharmacy to contact our office. Our fax number is [phone].  If you have an urgent issue when the clinic is closed that cannot wait until the next business day, you can page your doctor at the number below.    Please note that while we do our best to be available for urgent issues outside of office hours, we are not available 24/7.   If you have an urgent issue and are unable to reach us, you may choose to seek medical care at your doctor's office, retail clinic, urgent care center, or emergency room.  If you have a medical emergency, please immediately call 911 or go to the emergency department.  Pager Numbers  - Dr. Kowalski: [phone]  - Dr. Moye: [phone]  - Dr. Stewart:  [phone]  In the event of inclement weather, please call our main line at [phone] for an update on the status of any delays or closures.  Dermatology Medication Tips: Please keep the boxes that topical medications come in in order to help keep track of the instructions about where and how to use these. Pharmacies typically print the medication instructions only on the boxes and not directly on the medication tubes.   If your medication is too expensive, please contact our office at [phone] option 4 or send us a message through MyChart.   We are unable to tell what your co-pay for medications will be in advance as this is different depending on your insurance coverage. However, we may be able to find a substitute medication at lower cost or fill out paperwork to get insurance to cover a needed medication.   If a prior authorization is required to get your medication covered by your insurance company, please allow us 1-2 business days to complete this process.  Drug prices often vary depending on where the prescription is filled and some pharmacies may offer cheaper prices.  The website www.goodrx.com contains coupons for medications through different pharmacies. The prices here do not account for what the cost may be with help from insurance (it may be cheaper with your insurance), but the website can give you the price if you did not use any insurance.  - You can print the associated coupon and take it with   your prescription to the pharmacy.  - You may also stop by our office during regular business hours and pick up a GoodRx coupon card.  - If you need your prescription sent electronically to a different pharmacy, notify our office through [facility] MyChart or by phone at [phone] option 4.     Si Usted Necesita Algo Despus de Su Visita  Tambin puede enviarnos un mensaje a travs de MyChart. Por lo general respondemos a los mensajes de MyChart en el transcurso de 1 a 2  das hbiles.  Para renovar recetas, por favor pida a su farmacia que se ponga en contacto con nuestra oficina. Nuestro nmero de fax es el [phone].  Si tiene un asunto urgente cuando la clnica est cerrada y que no puede esperar hasta el siguiente da hbil, puede llamar/localizar a su doctor(a) al nmero que aparece a continuacin.   Por favor, tenga en cuenta que aunque hacemos todo lo posible para estar disponibles para asuntos urgentes fuera del horario de oficina, no estamos disponibles las 24 horas del da, los 7 das de la semana.   Si tiene un problema urgente y no puede comunicarse con nosotros, puede optar por buscar atencin mdica  en el consultorio de su doctor(a), en una clnica privada, en un centro de atencin urgente o en una sala de emergencias.  Si tiene una emergencia mdica, por favor llame inmediatamente al 911 o vaya a la sala de emergencias.  Nmeros de bper  - Dr. Kowalski: [phone]  - Dra. Moye: [phone]  - Dra. Stewart: [phone]  En caso de inclemencias del tiempo, por favor llame a nuestra lnea principal al [phone] para una actualizacin sobre el estado de cualquier retraso o cierre.  Consejos para la medicacin en dermatologa: Por favor, guarde las cajas en las que vienen los medicamentos de uso tpico para ayudarle a seguir las instrucciones sobre dnde y cmo usarlos. Las farmacias generalmente imprimen las instrucciones del medicamento slo en las cajas y no directamente en los tubos del medicamento.   Si su medicamento es muy caro, por favor, pngase en contacto con nuestra oficina llamando al [phone] y presione la opcin 4 o envenos un mensaje a travs de MyChart.   No podemos decirle cul ser su copago por los medicamentos por adelantado ya que esto es diferente dependiendo de la cobertura de su seguro. Sin embargo, es posible que podamos encontrar un medicamento sustituto a menor costo o llenar un formulario para que el  seguro cubra el medicamento que se considera necesario.   Si se requiere una autorizacin previa para que su compaa de seguros cubra su medicamento, por favor permtanos de 1 a 2 das hbiles para completar este proceso.  Los precios de los medicamentos varan con frecuencia dependiendo del lugar de dnde se surte la receta y alguna farmacias pueden ofrecer precios ms baratos.  El sitio web www.goodrx.com tiene cupones para medicamentos de diferentes farmacias. Los precios aqu no tienen en cuenta lo que podra costar con la ayuda del seguro (puede ser ms barato con su seguro), pero el sitio web puede darle el precio si no utiliz ningn seguro.  - Puede imprimir el cupn correspondiente y llevarlo con su receta a la farmacia.  - Tambin puede pasar por nuestra oficina durante el horario de atencin regular y recoger una tarjeta de cupones de GoodRx.  - Si necesita que su receta se enve electrnicamente a una farmacia diferente, informe a nuestra oficina a travs de MyChart [facility]   o por telfono llamando al [phone] y presione la opcin 4.  

## 2022-05-08 NOTE — Progress Notes (Signed)
   Follow-Up Visit   Subjective  Mariah Hogan is a 72 y.o. female who presents for the following: Irritated skin lesion (On the scalp - tender to the touch, hits when she combs her hair). The patient has spots, moles and lesions to be evaluated, some may be new or changing and the patient has concerns that these could be cancer.  The following portions of the chart were reviewed this encounter and updated as appropriate:   Tobacco  Allergies  Meds  Problems  Med Hx  Surg Hx  Fam Hx     Review of Systems:  No other skin or systemic complaints except as noted in HPI or Assessment and Plan.  Objective  Well appearing patient in no apparent distress; mood and affect are within normal limits.  A focused examination was performed including the scalp. Relevant physical exam findings are noted in the Assessment and Plan.  Scalp x 2, back x 18 (20) Erythematous stuck-on, waxy papule or plaque   Assessment & Plan  Inflamed seborrheic keratosis (20) Scalp x 2, back x 18 Symptomatic, irritating, patient would like treated. Destruction of lesion - Scalp x 2, back x 18 Complexity: simple   Destruction method: cryotherapy   Informed consent: discussed and consent obtained   Timeout:  patient name, date of birth, surgical site, and procedure verified Lesion destroyed using liquid nitrogen: Yes   Region frozen until ice ball extended beyond lesion: Yes   Outcome: patient tolerated procedure well with no complications   Post-procedure details: wound care instructions given    Actinic Damage - chronic, secondary to cumulative UV radiation exposure/sun exposure over time - diffuse scaly erythematous macules with underlying dyspigmentation - Recommend daily broad spectrum sunscreen SPF 30+ to sun-exposed areas, reapply every 2 hours as needed.  - Recommend staying in the shade or wearing long sleeves, sun glasses (UVA+UVB protection) and wide brim hats (4-inch brim around the entire  circumference of the hat). - Call for new or changing lesions.  Seborrheic Keratoses - Stuck-on, waxy, tan-brown papules and/or plaques  - Benign-appearing - Discussed benign etiology and prognosis. - Observe - Call for any changes - Counseling for BBL / IPL / Laser and Coordination of Care Discussed the treatment option of Broad Band Light (BBL) /Intense Pulsed Light (IPL)/ Laser for skin discoloration, including brown spots and redness.  Typically we recommend at least 1-3 treatment sessions about 5-8 weeks apart for best results.  Cannot have tanned skin when BBL performed, and regular use of sunscreen is advised after the procedure to help maintain results. The patient's condition may also require "maintenance treatments" in the future.  The fee for BBL / laser treatments is $350 per treatment session for the whole face.  A fee can be quoted for other parts of the body.  Insurance typically does not pay for BBL/laser treatments and therefore the fee is an out-of-pocket cost.  Return for appointment as scheduled.  Luther Redo, CMA, am acting as scribe for Sarina Ser, MD . Documentation: I have reviewed the above documentation for accuracy and completeness, and I agree with the above.  Sarina Ser, MD

## 2022-05-16 ENCOUNTER — Encounter: Payer: Self-pay | Admitting: Dermatology

## 2022-06-18 ENCOUNTER — Ambulatory Visit (INDEPENDENT_AMBULATORY_CARE_PROVIDER_SITE_OTHER): Payer: Medicare Other | Admitting: Dermatology

## 2022-06-18 VITALS — BP 122/82 | HR 82

## 2022-06-18 DIAGNOSIS — D489 Neoplasm of uncertain behavior, unspecified: Secondary | ICD-10-CM

## 2022-06-18 DIAGNOSIS — L720 Epidermal cyst: Secondary | ICD-10-CM

## 2022-06-18 DIAGNOSIS — Z1283 Encounter for screening for malignant neoplasm of skin: Secondary | ICD-10-CM

## 2022-06-18 DIAGNOSIS — B009 Herpesviral infection, unspecified: Secondary | ICD-10-CM

## 2022-06-18 DIAGNOSIS — D485 Neoplasm of uncertain behavior of skin: Secondary | ICD-10-CM | POA: Diagnosis not present

## 2022-06-18 DIAGNOSIS — Z853 Personal history of malignant neoplasm of breast: Secondary | ICD-10-CM | POA: Diagnosis not present

## 2022-06-18 DIAGNOSIS — L578 Other skin changes due to chronic exposure to nonionizing radiation: Secondary | ICD-10-CM | POA: Diagnosis not present

## 2022-06-18 DIAGNOSIS — L814 Other melanin hyperpigmentation: Secondary | ICD-10-CM

## 2022-06-18 DIAGNOSIS — Z79899 Other long term (current) drug therapy: Secondary | ICD-10-CM

## 2022-06-18 DIAGNOSIS — D229 Melanocytic nevi, unspecified: Secondary | ICD-10-CM

## 2022-06-18 DIAGNOSIS — L820 Inflamed seborrheic keratosis: Secondary | ICD-10-CM | POA: Diagnosis not present

## 2022-06-18 DIAGNOSIS — L821 Other seborrheic keratosis: Secondary | ICD-10-CM

## 2022-06-18 DIAGNOSIS — Z85828 Personal history of other malignant neoplasm of skin: Secondary | ICD-10-CM

## 2022-06-18 DIAGNOSIS — L729 Follicular cyst of the skin and subcutaneous tissue, unspecified: Secondary | ICD-10-CM

## 2022-06-18 MED ORDER — VALACYCLOVIR HCL 1 G PO TABS: 1000.0000 mg | ORAL_TABLET | ORAL | 11 refills | Status: DC

## 2022-06-18 NOTE — Patient Instructions (Addendum)
Biopsy Wound Care Instructions  Leave the original bandage on for 24 hours if possible.  If the bandage becomes soaked or soiled before that time, it is OK to remove it and examine the wound.  A small amount of post-operative bleeding is normal.  If excessive bleeding occurs, remove the bandage, place gauze over the site and apply continuous pressure (no peeking) over the area for 30 minutes. If this does not work, please call our clinic as soon as possible or page your doctor if it is after hours.   Once a day, cleanse the wound with soap and water. It is fine to shower. If a thick crust develops you may use a Q-tip dipped into dilute hydrogen peroxide (mix 1:1 with water) to dissolve it.  Hydrogen peroxide can slow the healing process, so use it only as needed.    After washing, apply petroleum jelly (Vaseline) or an antibiotic ointment if your doctor prescribed one for you, followed by a bandage.    For best healing, the wound should be covered with a layer of ointment at all times. If you are not able to keep the area covered with a bandage to hold the ointment in place, this may mean re-applying the ointment several times a day.  Continue this wound care until the wound has healed and is no longer open.   Itching and mild discomfort is normal during the healing process. However, if you develop pain or severe itching, please call our office.   If you have any discomfort, you can take Tylenol (acetaminophen) or ibuprofen as directed on the bottle. (Please do not take these if you have an allergy to them or cannot take them for another reason).  Some redness, tenderness and white or yellow material in the wound is normal healing.  If the area becomes very sore and red, or develops a thick yellow-green material (pus), it may be infected; please notify us.    If you have stitches, return to clinic as directed to have the stitches removed. You will continue wound care for 2-3 days after the stitches  are removed.   Wound healing continues for up to one year following surgery. It is not unusual to experience pain in the scar from time to time during the interval.  If the pain becomes severe or the scar thickens, you should notify the office.    A slight amount of redness in a scar is expected for the first six months.  After six months, the redness will fade and the scar will soften and fade.  The color difference becomes less noticeable with time.  If there are any problems, return for a post-op surgery check at your earliest convenience.  To improve the appearance of the scar, you can use silicone scar gel, cream, or sheets (such as Mederma or Serica) every night for up to one year. These are available over the counter (without a prescription).  Please call our office at [phone] for any questions or concerns.         For Milia at face  Discussed this is a type of cyst. Benign-appearing.  Sometimes these will clear with OTC adapalene/Differin 0.1% cream nightly     Seborrheic Keratosis  What causes seborrheic keratoses? Seborrheic keratoses are harmless, common skin growths that first appear during adult life.  As time goes by, more growths appear.  Some people may develop a large number of them.  Seborrheic keratoses appear on both covered and uncovered body parts.  They are not caused by sunlight.  The tendency to develop seborrheic keratoses can be inherited.  They vary in color from skin-colored to gray, brown, or even black.  They can be either smooth or have a rough, warty surface.   Seborrheic keratoses are superficial and look as if they were stuck on the skin.  Under the microscope this type of keratosis looks like layers upon layers of skin.  That is why at times the top layer may seem to fall off, but the rest of the growth remains and re-grows.    Treatment Seborrheic keratoses do not need to be treated, but can easily be removed in the office.  Seborrheic  keratoses often cause symptoms when they rub on clothing or jewelry.  Lesions can be in the way of shaving.  If they become inflamed, they can cause itching, soreness, or burning.  Removal of a seborrheic keratosis can be accomplished by freezing, burning, or surgery. If any spot bleeds, scabs, or grows rapidly, please return to have it checked, as these can be an indication of a skin cancer.    Recommend starting moisturizer with exfoliant (Urea, Salicylic acid, or Lactic acid) one to two times daily to help smooth rough and bumpy skin.  OTC options include Cetaphil Rough and Bumpy lotion (Urea), Eucerin Roughness Relief lotion or spot treatment cream (Urea), CeraVe SA lotion/cream for Rough and Bumpy skin (Sal Acid), Gold Bond Rough and Bumpy cream (Sal Acid), and AmLactin 12% lotion/cream (Lactic Acid).  If applying in morning, also apply sunscreen to sun-exposed areas, since these exfoliating moisturizers can increase sensitivity to sun.    Gentle Skin Care Guide  1. Bathe no more than once a day.  2. Avoid bathing in hot water  3. Use a mild soap like Dove, Vanicream, Cetaphil, CeraVe. Can use Lever 2000 or Cetaphil antibacterial soap  4. Use soap only where you need it. On most days, use it under your arms, between your legs, and on your feet. Let the water rinse other areas unless visibly dirty.  5. When you get out of the bath/shower, use a towel to gently blot your skin dry, don't rub it.  6. While your skin is still a little damp, apply a moisturizing cream such as Vanicream, CeraVe, Cetaphil, Eucerin, Sarna lotion or plain Vaseline Jelly. For hands apply Neutrogena [place] Hand Cream or Excipial Hand Cream.  7. Reapply moisturizer any time you start to itch or feel dry.  8. Sometimes using free and clear laundry detergents can be helpful. Fabric softener sheets should be avoided. Downy Free & Gentle liquid, or any liquid fabric softener that is free of dyes and perfumes, it  acceptable to use  9. If your doctor has given you prescription creams you may apply moisturizers over them    Herpes Simplex Virus = Cold Sores = Fever Blisters is a chronic recurring blistering; scabbing sore-producing viral infection that is recurrent usually in the same area triggered by stress, sun/UV exposure and trauma.  It is infectious and can be spread from person to person by direct contact.  It is not curable, but is treatable with topical and oral medication.  Continue valtrex 1 gm take 2 at first sign of flare repeat in 12 hours      Melanoma ABCDEs  Melanoma is the most dangerous type of skin cancer, and is the leading cause of death from skin disease.  You are more likely to develop melanoma if you: Have light-colored skin, light-colored  eyes, or red or blond hair Spend a lot of time in the sun Tan regularly, either outdoors or in a tanning bed Have had blistering sunburns, especially during childhood Have a close family member who has had a melanoma Have atypical moles or large birthmarks  Early detection of melanoma is key since treatment is typically straightforward and cure rates are extremely high if we catch it early.   The first sign of melanoma is often a change in a mole or a new dark spot.  The ABCDE system is a way of remembering the signs of melanoma.  A for asymmetry:  The two halves do not match. B for border:  The edges of the growth are irregular. C for color:  A mixture of colors are present instead of an even brown color. D for diameter:  Melanomas are usually (but not always) greater than 6mm - the size of a pencil eraser. E for evolution:  The spot keeps changing in size, shape, and color.  Please check your skin once per month between visits. You can use a small mirror in front and a large mirror behind you to keep an eye on the back side or your body.   If you see any new or changing lesions before your next follow-up, please call to schedule a  visit.  Please continue daily skin protection including broad spectrum sunscreen SPF 30+ to sun-exposed areas, reapplying every 2 hours as needed when you're outdoors.   Staying in the shade or wearing long sleeves, sun glasses (UVA+UVB protection) and wide brim hats (4-inch brim around the entire circumference of the hat) are also recommended for sun protection.    Due to recent changes in healthcare laws, you may see results of your pathology and/or laboratory studies on MyChart before the doctors have had a chance to review them. We understand that in some cases there may be results that are confusing or concerning to you. Please understand that not all results are received at the same time and often the doctors may need to interpret multiple results in order to provide you with the best plan of care or course of treatment. Therefore, we ask that you please give [place] 2 business days to thoroughly review all your results before contacting the office for clarification. Should we see a critical lab result, you will be contacted sooner.   If You Need Anything After Your Visit  If you have any questions or concerns for your doctor, please call our main line at [phone] and press option 4 to reach your doctor's medical assistant. If no one answers, please leave a voicemail as directed and we will return your call as soon as possible. Messages left after 4 pm will be answered the following business day.   You may also send [place] a message via MyChart. We typically respond to MyChart messages within 1-2 business days.  For prescription refills, please ask your pharmacy to contact our office. Our fax number is [phone].  If you have an urgent issue when the clinic is closed that cannot wait until the next business day, you can page your doctor at the number below.    Please note that while we do our best to be available for urgent issues outside of office hours, we are not available 24/7.   If you  have an urgent issue and are unable to reach [place], you may choose to seek medical care at your doctor's office, retail clinic, urgent care center, or  emergency room.  If you have a medical emergency, please immediately call 911 or go to the emergency department.  Pager Numbers  - Dr. Gwen Pounds: [phone]  - Dr. Neale Burly: [phone]  - Dr. Roseanne Reno: [phone]  In the event of inclement weather, please call our main line at [phone] for an update on the status of any delays or closures.  Dermatology Medication Tips: Please keep the boxes that topical medications come in in order to help keep track of the instructions about where and how to use these. Pharmacies typically print the medication instructions only on the boxes and not directly on the medication tubes.   If your medication is too expensive, please contact our office at [phone] option 4 or send [place] a message through MyChart.   We are unable to tell what your co-pay for medications will be in advance as this is different depending on your insurance coverage. However, we may be able to find a substitute medication at lower cost or fill out paperwork to get insurance to cover a needed medication.   If a prior authorization is required to get your medication covered by your insurance company, please allow [place] 1-2 business days to complete this process.  Drug prices often vary depending on where the prescription is filled and some pharmacies may offer cheaper prices.  The website www.goodrx.com contains coupons for medications through different pharmacies. The prices here do not account for what the cost may be with help from insurance (it may be cheaper with your insurance), but the website can give you the price if you did not use any insurance.  - You can print the associated coupon and take it with your prescription to the pharmacy.  - You may also stop by our office during regular business hours and pick up a GoodRx coupon  card.  - If you need your prescription sent electronically to a different pharmacy, notify our office through [facility] or by phone at [phone] option 4.     Si Usted Necesita Algo Despus de Su Visita  Tambin puede enviarnos un mensaje a travs de Clinical cytogeneticist. Por lo general respondemos a los mensajes de MyChart en el transcurso de 1 a 2 das hbiles.  Para renovar recetas, por favor pida a su farmacia que se ponga en contacto con nuestra oficina. Jacquelene Sable de fax es [place] [phone].  Si tiene un asunto urgente cuando la clnica est cerrada y que no puede esperar hasta el siguiente da hbil, puede llamar/localizar a su doctor(a) al nmero que aparece a continuacin.   Por favor, tenga en cuenta que aunque hacemos todo lo posible para estar disponibles para asuntos urgentes fuera del horario de [place], no estamos disponibles las 24 horas del da, los 7 [address]  [address] de la [place].   Si tiene un problema urgente y no puede comunicarse con nosotros, puede optar por buscar atencin mdica  en el consultorio de su doctor(a), en una clnica privada, en un centro de atencin urgente o en una sala de emergencias.  Si tiene Engineer, drilling, por favor llame inmediatamente al 911 o vaya a la sala de emergencias.  Nmeros de bper  - Dr. Gwen Pounds: [phone]  - Dra. Moye: [phone]  - Dra. Roseanne Reno: [phone]  En caso de inclemencias del [place], por favor llame a Lacy Duverney principal al [phone] para una actualizacin sobre el [place] de cualquier retraso o cierre.  Consejos para la medicacin en dermatologa: Por favor, guarde las cajas en las que vienen los  medicamentos de uso tpico para ayudarle a seguir las [employer] dnde y cmo usarlos. Las farmacias generalmente imprimen las instrucciones del medicamento slo en las cajas y no directamente en los tubos del [place].   Si su medicamento es muy caro, por favor, pngase en contacto con Rolm Gala llamando al [phone] y presione la opcin 4 o envenos un mensaje a travs de Clinical cytogeneticist.   No podemos decirle cul ser su copago por los medicamentos por adelantado ya que esto es diferente dependiendo de la cobertura de su seguro. Sin embargo, es posible que podamos encontrar un medicamento sustituto a Audiological scientist un formulario para que el seguro cubra el medicamento que se considera necesario.   Si se requiere una autorizacin previa para que su compaa de seguros [place] su medicamento, por favor permtanos de 1 a 2 das hbiles para completar [address]  Los precios de los medicamentos varan con frecuencia dependiendo del Environmental consultant de dnde se surte la receta y alguna farmacias pueden ofrecer precios ms baratos.  El sitio web www.goodrx.com tiene cupones para medicamentos de Health and safety inspector. Los precios aqu no tienen en cuenta lo que podra costar con la ayuda del seguro (puede ser ms barato con su seguro), pero el sitio web puede darle el precio si no utiliz Tourist information centre manager.  - Puede imprimir el cupn correspondiente y llevarlo con su receta a la farmacia.  - Tambin puede pasar por nuestra oficina durante el horario de atencin regular y Education officer, museum una tarjeta de cupones de GoodRx.  - Si necesita que su receta se enve electrnicamente a una farmacia diferente, informe a nuestra oficina a travs de MyChart de [place] o por telfono llamando al [phone] y presione la opcin 4.

## 2022-06-18 NOTE — Progress Notes (Signed)
Follow-Up Visit   Subjective  Mariah Hogan is a 72 y.o. female who presents for the following: Skin Cancer Screening and Full Body Skin Exam isk bothering at scalp, left neck, red bump at left breast , bumps on left side of cheek, under bra area, rough left upper arm.  The patient presents for Total-Body Skin Exam (TBSE) for skin cancer screening and mole check. The patient has spots, moles and lesions to be evaluated, some may be new or changing and the patient has concerns that these could be cancer.  The following portions of the chart were reviewed this encounter and updated as appropriate: medications, allergies, medical history  Review of Systems:  No other skin or systemic complaints except as noted in HPI or Assessment and Plan.  Objective  Well appearing patient in no apparent distress; mood and affect are within normal limits.  A full examination was performed including scalp, head, eyes, ears, nose, lips, neck, chest, axillae, abdomen, back, buttocks, bilateral upper extremities, bilateral lower extremities, hands, feet, fingers, toes, fingernails, and toenails. All findings within normal limits unless otherwise noted below.   Relevant physical exam findings are noted in the Assessment and Plan.  right breast inferior to areola 0.6 cm dark brown flat papule          Assessment & Plan   INFLAMED SEBORRHEIC KERATOSIS Exam: Erythematous keratotic or waxy stuck-on papule or plaque.  Symptomatic, irritating, patient would like treated.  Benign-appearing.  Call clinic for new or changing lesions.   Prior to procedure, discussed risks of blister formation, small wound, skin dyspigmentation, or rare scar following treatment. Recommend Vaseline ointment to treated areas while healing.  Destruction Procedure Note Destruction method: cryotherapy   Informed consent: discussed and consent obtained   Lesion destroyed using liquid nitrogen: Yes   Outcome: patient  tolerated procedure well with no complications   Post-procedure details: wound care instructions given   Locations: scalp x 1, trunk and back x 16 # of Lesions Treated: 17  MILIA Exam: tiny erythematous firm white papule Discussed this is a type of cyst. Benign-appearing. Sometimes these will clear with OTC adapalene/Differin 0.1% cream QHS.  Herpes simplex Lips Herpes Simplex Virus = Cold Sores = Fever Blisters is a chronic recurring blistering; scabbing sore-producing viral infection that is recurrent usually in the same area triggered by stress, sun/UV exposure and trauma.  It is infectious and can be spread from person to person by direct contact.  It is not curable, but is treatable with topical and oral medication.    Cont Valtrex 1 gm take 2 at first sign of flare repeat in 12 hours    LENTIGINES, SEBORRHEIC KERATOSES, HEMANGIOMAS - Benign normal skin lesions - Benign-appearing - Call for any changes  MELANOCYTIC NEVI - Tan-brown and/or pink-flesh-colored symmetric macules and papules - Benign appearing on exam today - Observation - Call clinic for new or changing moles - Recommend daily use of broad spectrum spf 30+ sunscreen to sun-exposed areas.   ACTINIC DAMAGE - Chronic condition, secondary to cumulative UV/sun exposure - diffuse scaly erythematous macules with underlying dyspigmentation - Recommend daily broad spectrum sunscreen SPF 30+ to sun-exposed areas, reapply every 2 hours as needed.  - Staying in the shade or wearing long sleeves, sun glasses (UVA+UVB protection) and wide brim hats (4-inch brim around the entire circumference of the hat) are also recommended for sun protection.  - Call for new or changing lesions.  HISTORY OF BASAL CELL CARCINOMA OF THE SKIN -  No evidence of recurrence today - Recommend regular full body skin exams - Recommend daily broad spectrum sunscreen SPF 30+ to sun-exposed areas, reapply every 2 hours as needed.  - Call if any new or  changing lesions are noted between office visits  SKIN CANCER SCREENING PERFORMED TODAY.  History of breast cancer No lymphadenopathy.  Skin clear today.  Neoplasm of uncertain behavior right breast inferior to areola  Epidermal / dermal shaving  Lesion diameter (cm):  0.6 Informed consent: discussed and consent obtained   Timeout: patient name, date of birth, surgical site, and procedure verified   Procedure prep:  Patient was prepped and draped in usual sterile fashion Prep type:  Isopropyl alcohol Anesthesia: the lesion was anesthetized in a standard fashion   Anesthetic:  1% lidocaine w/ epinephrine 1-100,000 buffered w/ 8.4% NaHCO3 Instrument used: flexible razor blade   Hemostasis achieved with: pressure, aluminum chloride and electrodesiccation   Outcome: patient tolerated procedure well   Post-procedure details: sterile dressing applied and wound care instructions given   Dressing type: bandage and petrolatum    Specimen 1 - Surgical pathology Differential Diagnosis: Irritated nevus r/o dysplasia vs dark hemangioma   Check Margins: No  Irritated nevus r/o dysplasia vs dark hemangioma   Herpes simplex  Related Medications valACYclovir (VALTREX) 1000 MG tablet Take 1 tablet (1,000 mg total) by mouth as directed.   Return in about 1 year (around 06/18/2023) for TBSE.  IAsher Muir, CMA, am acting as scribe for Armida Sans, MD.  Documentation: I have reviewed the above documentation for accuracy and completeness, and I agree with the above.  Armida Sans, MD

## 2022-06-24 ENCOUNTER — Telehealth: Payer: Self-pay

## 2022-06-24 NOTE — Telephone Encounter (Signed)
-----   Message from Deirdre Evener, MD sent at 06/24/2022 12:33 PM EDT ----- Diagnosis Skin , right breast inferior to areola HEMANGIOMA WITH THROMBOSIS  Benign hemangioma = collection of blood vessels No further treatment needed

## 2022-06-24 NOTE — Telephone Encounter (Signed)
Advised patient of results/hd  

## 2022-07-06 ENCOUNTER — Encounter: Payer: Self-pay | Admitting: Dermatology

## 2022-08-22 ENCOUNTER — Other Ambulatory Visit: Payer: Self-pay | Admitting: General Surgery

## 2022-08-22 DIAGNOSIS — Z1231 Encounter for screening mammogram for malignant neoplasm of breast: Secondary | ICD-10-CM

## 2022-10-28 ENCOUNTER — Ambulatory Visit
Admission: RE | Admit: 2022-10-28 | Discharge: 2022-10-28 | Disposition: A | Discharge status: Home / Self Care | Payer: Medicare Other | Source: Ambulatory Visit | Attending: General Surgery | Admitting: General Surgery

## 2022-10-28 DIAGNOSIS — Z1231 Encounter for screening mammogram for malignant neoplasm of breast: Secondary | ICD-10-CM | POA: Diagnosis present

## 2022-12-02 ENCOUNTER — Inpatient Hospital Stay: Payer: Medicare Other | Attending: Internal Medicine | Admitting: Internal Medicine

## 2022-12-02 ENCOUNTER — Encounter: Payer: Self-pay | Admitting: Internal Medicine

## 2022-12-02 VITALS — BP 145/67 | HR 76 | Resp 18 | Wt 165.0 lb

## 2022-12-02 DIAGNOSIS — M549 Dorsalgia, unspecified: Secondary | ICD-10-CM | POA: Insufficient documentation

## 2022-12-02 DIAGNOSIS — G8929 Other chronic pain: Secondary | ICD-10-CM | POA: Insufficient documentation

## 2022-12-02 DIAGNOSIS — C50812 Malignant neoplasm of overlapping sites of left female breast: Secondary | ICD-10-CM

## 2022-12-02 DIAGNOSIS — Z853 Personal history of malignant neoplasm of breast: Secondary | ICD-10-CM | POA: Diagnosis present

## 2022-12-02 DIAGNOSIS — M2550 Pain in unspecified joint: Secondary | ICD-10-CM | POA: Diagnosis not present

## 2022-12-02 DIAGNOSIS — M8580 Other specified disorders of bone density and structure, unspecified site: Secondary | ICD-10-CM | POA: Diagnosis not present

## 2022-12-02 DIAGNOSIS — Z170 Estrogen receptor positive status [ER+]: Secondary | ICD-10-CM | POA: Diagnosis not present

## 2022-12-02 DIAGNOSIS — Z9012 Acquired absence of left breast and nipple: Secondary | ICD-10-CM | POA: Diagnosis not present

## 2022-12-02 DIAGNOSIS — N183 Chronic kidney disease, stage 3 unspecified: Secondary | ICD-10-CM | POA: Insufficient documentation

## 2022-12-02 NOTE — Progress Notes (Signed)
Patient just here for follow up per PCP since she hasn't been here in a while

## 2022-12-02 NOTE — Progress Notes (Signed)
[facility] CONSULT NOTE  Patient Care Team: Patrice Paradise, MD as PCP - General (Physician Assistant) Lemar Livings, Merrily Pew, MD (General Surgery) Scarlett Presto, RN (Inactive) as Oncology Nurse Navigator Earna Coder, MD as Consulting Physician (Oncology)  CHIEF COMPLAINTS/PURPOSE OF CONSULTATION: breast cancer  #  Oncology History Overview Note  # 2019-  clinical stage IA left breast cancer s/p biopsy on 08/04/2017.  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  Tumor was ER + (95%), PR+ (20%), and Her 2 neu - by FISH.  Ki67 was 80%.  CA27.29 was 17.3 on 08/06/2017.   MammaPrint was high risk luminal-type B with a 10 year risk of recurrence untreated of 29% and 94.6% of distant metastasis free interval with chemotherapy + hormonal therapy.   Left breast ultrasound on 08/04/2017 revealed a 1.65 x 1.69 x 1.86 cm multi-lobulated hypoechoic mass in the upper outer quadrant of the left breast in the 2 o'clock position 7 cm from the nipple.   Bilateral breast MRI on 08/20/2017 revealed a 2.4 cm enhancing irregular mass in the outer left breast.  There were several prominent lymph nodes with borderline thicked cortices between 3-4 mm, but normal morphology.  There was a 5 mm enhancing mass in the anterior, retroareolar region of the right breast   MRI guided RIGHT breast biopsy done on 09/02/2017.  Pathology is pending.   Chest CT on 09/23/2016 revealed a 4 mm RUL subpleural nodule.  Chest CT on 08/17/2017 revealed a small 4 mm RUL nodule unchanged in size.  # Chemo- neo-adjuvant taxane?; Adriamycin [Dr.Corcoran]   She received neoadjuvant Femara (08/12/2017 - 08/31/2017) secondary to planned a delay in surgery (mastectomy with immediate reconstruction).   Bone density on 08/17/2017 revealed osteopenia with a T-score of -1.5 in the left femoral neck and -0.7 in the AP spine L1-L4.   She has a significant family history of breast cancer  (paternal grandmother, sister, and 3 maternal cousins).   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   # [id number]; stopped sec to [facility] in May 2021; started Anastrazole  # July 2021- Stopped Anastrazole sec to [facility]/? Ortho surgery  # MAY 2022- S/p examesane- STOPPED soon- sec to side effects.        Invasive ductal carcinoma of left breast (HCC)  08/05/2017 Initial Diagnosis   Invasive ductal carcinoma of left breast (HCC)   09/03/2017 - 10/01/2017 Chemotherapy   The patient had dexamethasone (DECADRON) 4 MG tablet, 8 mg, Oral, 2 times daily, 1 of 1 cycle, Start date: 08/31/2017, End date: 10/13/2017 palonosetron (ALOXI) injection 0.25 mg, 0.25 mg, Intravenous,  Once, 2 of 4 cycles Administration: 0.25 mg (09/03/2017), 0.25 mg (10/01/2017) pegfilgrastim-cbqv (UDENYCA) injection 6 mg, 6 mg, Subcutaneous, Once, 2 of 4 cycles Administration: 6 mg (09/04/2017) cyclophosphamide (CYTOXAN) 1,120 mg in sodium chloride 0.9 % 250 mL chemo infusion, 600 mg/m2 = 1,120 mg, Intravenous,  Once, 2 of 4 cycles Dose modification: 500 mg/m2 (original dose 600 mg/m2, Cycle 2, Reason: Dose not tolerated, Comment: cycle #1 not tolerated. Dose reduction.) Administration: 1,120 mg (09/03/2017) DOCEtaxel (TAXOTERE) 140 mg in sodium chloride 0.9 % 250 mL chemo infusion, 75 mg/m2 = 140 mg, Intravenous,  Once, 2 of 4 cycles Dose modification: 60 mg/m2 (original dose 75 mg/m2, Cycle 2, Reason: Provider Judgment, Comment: excess toxicity ith cycle #1) Administration: 140 mg (09/03/2017), 110 mg (10/01/2017)  for chemotherapy treatment.  10/27/2017 - 12/09/2017 Chemotherapy   The patient had dexamethasone (DECADRON) 4 MG tablet, 1 of 1 cycle, Start date: 10/23/2017, End date: 11/05/2017 DOXOrubicin (ADRIAMYCIN) chemo injection 110 mg, 60 mg/m2 = 110 mg, Intravenous,  Once, 3 of 3 cycles Administration: 110 mg (10/27/2017), 110 mg (11/17/2017), 110 mg  (12/08/2017) palonosetron (ALOXI) injection 0.25 mg, 0.25 mg, Intravenous,  Once, 3 of 3 cycles Administration: 0.25 mg (10/27/2017), 0.25 mg (11/17/2017), 0.25 mg (12/08/2017) pegfilgrastim-cbqv (UDENYCA) injection 6 mg, 6 mg, Subcutaneous, Once, 3 of 3 cycles Administration: 6 mg (10/28/2017), 6 mg (11/18/2017), 6 mg (12/09/2017) cyclophosphamide (CYTOXAN) 1,100 mg in sodium chloride 0.9 % 250 mL chemo infusion, 600 mg/m2 = 1,100 mg, Intravenous,  Once, 3 of 3 cycles Administration: 1,100 mg (10/27/2017), 1,100 mg (11/17/2017), 1,100 mg (12/08/2017) fosaprepitant (EMEND) 150 mg, dexamethasone (DECADRON) 12 mg in sodium chloride 0.9 % 145 mL IVPB, , Intravenous,  Once, 3 of 3 cycles Administration:  (10/27/2017),  (11/17/2017),  (12/08/2017)  for chemotherapy treatment.    Carcinoma of overlapping sites of left breast in female, estrogen receptor positive (HCC)  07/30/2020 Initial Diagnosis   Carcinoma of overlapping sites of left breast in female, estrogen receptor positive (HCC)      HISTORY OF PRESENTING ILLNESS: Patient ambulating-independently.  Alone.  Mariah Hogan 72 y.o.  female stage I ER/PR positive HER2 negative breast cancer-status postmastectomy is here for follow-up.  Patient's previous oncologist-Dr. Merlene Pulling. Patient given high risk MammaPrint underwent-adjuvant chemotherapy.    Patient status post anastrozole and letrozole.  In May 2024-also on exemestane.  However patient stopped because of poor tolerance/side effects.  Patient has been following with with Dr. Lemar Livings.  However more recently patient follow-up with Dr. Maia Plan who recommended follow-up with medical oncology regarding antihormone therapy.  Patient continues to have chronic back pain joint pains.  Otherwise denies any new shortness of breath or cough.  Denies any new onset of back pain or bone pain.    Review of Systems  Constitutional:  Negative for chills, diaphoresis, fever, malaise/fatigue and weight loss.   HENT:  Negative for nosebleeds and sore throat.   Eyes:  Negative for double vision.  Respiratory:  Negative for cough, hemoptysis, sputum production, shortness of breath and wheezing.   Cardiovascular:  Negative for chest pain, palpitations, orthopnea and leg swelling.  Gastrointestinal:  Negative for abdominal pain, blood in stool, constipation, diarrhea, heartburn, melena, nausea and vomiting.  Genitourinary:  Negative for dysuria, frequency and urgency.  Musculoskeletal:  Positive for back pain and joint pain.  Skin: Negative.  Negative for itching and rash.  Neurological:  Negative for dizziness, tingling, focal weakness, weakness and headaches.  Endo/Heme/Allergies:  Does not bruise/bleed easily.  Psychiatric/Behavioral:  Negative for depression. The patient is not nervous/anxious and does not have insomnia.      MEDICAL HISTORY:  Past Medical History:  Diagnosis Date   Anemia    Arthritis    Asthma    H/O YEARS AGO-NOW HAS COUGHING SPELLS WHICH IS WHY SHE HAS HER ALBUTEROL INHALER   Basal cell carcinoma    R nose ([facility])   Basal cell carcinoma 01/29/2016   Right forehead above med brow   Basal cell carcinoma 10/12/2019   L nasal tip - MOHS 05/15/2020   BRCA gene mutation negative 08/12/2017   Variant of unknown significance at [facility] only abnormality.   Breast cancer (HCC) 08/04/2017   1.8 cm ER 90%, PR 20%, HER-2/neu negative invasive mammary carcinoma of the left upper outer quadrant.  MammaPrint:  High risk.   Cancer (HCC)    [phone] basal cell carcinoma   Complication of anesthesia    HARD TIME WAKING UP   Dysplastic nevus 02/21/2010   Right mid pretibial, mild   Dysrhythmia    TACHYCARDIA   GERD (gastroesophageal reflux disease)    History of hiatal hernia    SMALL   Hyperlipidemia    Hypertension    PT STATES IN 01-18-18 PREOP PHONE INTERVIEW THAT HER BP HAS BEEN ELEVATED SINCE STARTING ON VALSARTAN- PCP SWITCHED PT BACK TO MAXIDE ON 01-18-18 AND PT STATES  THAT SHE HAS LOST 6-10 POUNDS OF FLUID SINCE RESTARTING MAXIDE.     Personal history of chemotherapy    PONV (postoperative nausea and vomiting)    Sjogren's syndrome (HCC) 07/2020    SURGICAL HISTORY: Past Surgical History:  Procedure Laterality Date   ABDOMINAL HYSTERECTOMY  1998   bladder tack  2002,2008   BREAST BIOPSY Left 1990   BREAST BIOPSY Left 2018   core bx- neg   BREAST BIOPSY Left 08/04/2017   left UOQ 2 oclock INVASIVE DUCTAL CARCINOMA.   BREAST BIOPSY Right 08/2017   MRI biopsy, FIBROCYSTIC CHANGE AND USUAL DUCTAL HYPERPLASIA,, clip placed   BREAST CYST EXCISION  x3   BREAST EXCISIONAL BIOPSY Bilateral    BREAST RECONSTRUCTION WITH PLACEMENT OF TISSUE EXPANDER AND FLEX HD (ACELLULAR HYDRATED DERMIS) Left 01/27/2018   Procedure: BREAST RECONSTRUCTION WITH PLACEMENT OF TISSUE EXPANDER AND FLEX HD (ACELLULAR HYDRATED DERMIS);  Surgeon: Peggye Form, DO;  Location: [facility];  Service: Plastics;  Laterality: Left;   BREAST SURGERY Left 02/13/14   Fibrocystic changes, pseudo-angiomatous stromal hyperplasia. No atypia or malignancy.   CARPAL TUNNEL RELEASE Bilateral x2   CHOLECYSTECTOMY  2008   COLONOSCOPY WITH PROPOFOL N/A 01/12/2015   Procedure: COLONOSCOPY WITH PROPOFOL;  Surgeon: Scot Jun, MD;  Location: [facility] ENDOSCOPY;  Service: Endoscopy;  Laterality: N/A;   COLONOSCOPY WITH PROPOFOL N/A 02/06/2020   Procedure: COLONOSCOPY WITH PROPOFOL;  Surgeon: Regis Bill, MD;  Location: [facility] ENDOSCOPY;  Service: Endoscopy;  Laterality: N/A;   ESOPHAGOGASTRODUODENOSCOPY (EGD) WITH PROPOFOL N/A 01/12/2015   Procedure: ESOPHAGOGASTRODUODENOSCOPY (EGD) WITH PROPOFOL;  Surgeon: Scot Jun, MD;  Location: [facility] ENDOSCOPY;  Service: Endoscopy;  Laterality: N/A;   ESOPHAGOGASTRODUODENOSCOPY (EGD) WITH PROPOFOL N/A 02/06/2020   Procedure: ESOPHAGOGASTRODUODENOSCOPY (EGD) WITH PROPOFOL;  Surgeon: Regis Bill, MD;  Location: [facility] ENDOSCOPY;  Service:  Endoscopy;  Laterality: N/A;   LIPOSUCTION WITH LIPOFILLING Left 12/23/2018   Procedure: LIPOSUCTION WITH LIPOFILLING FROM ABDOMEN TO LEFT BREAST;  Surgeon: Peggye Form, DO;  Location: [facility];  Service: Plastics;  Laterality: Left;  90 min, please   MASTECTOMY Left 2019   [facility]   MASTECTOMY W/ SENTINEL NODE BIOPSY Left 01/27/2018   ypT1c ypN0; ER 90%, PR 20%, HER-2/neu not overexpressed.  MASTECTOMY WITH SENTINEL LYMPH NODE BIOPSY;  Surgeon: Earline Mayotte, MD;  Location: [facility] OR[facility];  Service: General;  Laterality: Left;   MINOR BREAST BIOPSY Right 09/02/2017   Radiology perform procedure, fibrocystic changes right retroareolar area.   NASAL RECONSTRUCTION  1983   OOPHORECTOMY     PORT-A-CATH REMOVAL     PORTACATH PLACEMENT N/A 10/21/2017   Procedure: INSERTION PORT-A-CATH;  Surgeon: Earline Mayotte, MD;  Location: [facility] OR[facility];  Service: General;  Laterality: N/A;   REMOVAL OF TISSUE EXPANDER AND PLACEMENT OF IMPLANT Left 05/13/2018   Procedure: REMOVAL OF TISSUE EXPANDER AND PLACEMENT OF IMPLANT;  Surgeon: Peggye Form,  DO;  Location: [facility];  Service: Plastics;  Laterality: Left;  total surgery time should be 3 hours, per provider   REVERSE SHOULDER ARTHROPLASTY Right 11/27/2020   Procedure: REVERSE SHOULDER ARTHROPLASTY;  Surgeon: Christena Flake, MD;  Location: [facility];  Service: Orthopedics;  Laterality: Right;   SAVORY DILATION N/A 01/12/2015   Procedure: SAVORY DILATION;  Surgeon: Scot Jun, MD;  Location: [facility] ENDOSCOPY;  Service: Endoscopy;  Laterality: N/A;   TONSILLECTOMY AND ADENOIDECTOMY  1956    SOCIAL HISTORY: Social History   Socioeconomic History   Marital status: Single    Spouse name: Not on file   Number of children: Not on file   Years of education: Not on file   Highest education level: Not on file  Occupational History   Not on file  Tobacco Use   Smoking status: Never   Smokeless tobacco: Never  Vaping Use   Vaping  status: Never Used  Substance and Sexual Activity   Alcohol use: No    Alcohol/week: 0.0 standard drinks of alcohol   Drug use: No   Sexual activity: Not on file  Other Topics Concern   Not on file  Social History Narrative   Not on file   Social Determinants of Health   Financial Resource Strain: Low Risk  (01/27/2018)   Overall Financial Resource Strain (CARDIA)    Difficulty of Paying Living Expenses: Not hard at all  Food Insecurity: No Food Insecurity (01/27/2018)   Hunger Vital Sign    Worried About Running Out of Food in the Last Year: Never true    Ran Out of Food in the Last Year: Never true  Transportation Needs: No Transportation Needs (01/27/2018)   PRAPARE - Administrator, Civil Service (Medical): No    Lack of Transportation (Non-Medical): No  Physical Activity: Insufficiently Active (01/27/2018)   Exercise Vital Sign    Days of Exercise per Week: 1 day    Minutes of Exercise per Session: 10 min  Stress: Stress Concern Present (01/27/2018)   [employer] of Occupational Health - Occupational Stress Questionnaire    Feeling of Stress : To some extent  Social Connections: Somewhat Isolated (01/27/2018)   Social Connection and Isolation Panel [NHANES]    Frequency of Communication with Friends and Family: More than three times a week    Frequency of Social Gatherings with Friends and Family: More than three times a week    Attends Religious Services: 1 to 4 times per year    Active Member of [employer] or Organizations: No    Attends Banker Meetings: Never    Marital Status: Divorced  Catering manager Violence: Not At Risk (01/27/2018)   Humiliation, Afraid, Rape, and Kick questionnaire    Fear of Current or Ex-Partner: No    Emotionally Abused: No    Physically Abused: No    Sexually Abused: No    FAMILY HISTORY: Family History  Problem Relation Age of Onset   Breast cancer Sister 79       currently 33   Breast cancer Paternal  Grandmother 31       bilateral; deceased 47s    Breast cancer Cousin 90       daughter of an unaffected maternal aunt   Breast cancer Cousin 72       kidney cancer also; daughter of an unaffected maternal aunt   Other Mother        TAH/BSO prior to menopause  Prostate cancer Paternal Grandfather        age at dx unknown   Breast cancer Maternal Aunt        age at dx unknown   Breast cancer Cousin 50       daughter of an unaffected maternal aunt    ALLERGIES:  is allergic to cytoxan [cyclophosphamide], penicillin g, taxotere [docetaxel], other, parabens, shellac, ciprofloxacin, fluocinonide, tape, and triamcinolone.  MEDICATIONS:  Current Outpatient Medications  Medication Sig Dispense Refill   ascorbic acid (VITAMIN C) 500 MG tablet Take by mouth.     atenolol (TENORMIN) 25 MG tablet Take 0.5 tablets (12.5 mg total) by mouth 2 (two) times daily. 30 tablet 0   calcium carbonate (OSCAL) 1500 (600 Ca) MG TABS tablet Take 600 mg of elemental calcium by mouth daily with breakfast.     Cholecalciferol (VITAMIN D) 50 MCG (2000 UT) CAPS Take 2,000 Units by mouth daily.      citalopram (CELEXA) 20 MG tablet Take 20 mg by mouth at bedtime.      cyanocobalamin 1000 MCG tablet Take by mouth.     hydroxychloroquine (PLAQUENIL) 200 MG tablet Take 200 mg by mouth daily.     Multiple Vitamin (MULTI-VITAMIN) tablet Take by mouth.     pantoprazole (PROTONIX) 40 MG tablet Take 40 mg by mouth 2 (two) times daily.     PREMARIN vaginal cream Place 1 Applicatorful vaginally once a week.      triamterene-hydrochlorothiazide (MAXZIDE-25) 37.5-25 MG tablet Take 1 tablet by mouth daily. 30 tablet 0   valACYclovir (VALTREX) 1000 MG tablet Take 1 tablet (1,000 mg total) by mouth as directed. 30 tablet 11   albuterol (VENTOLIN HFA) 108 (90 Base) MCG/ACT inhaler Inhale into the lungs. (Patient not taking: Reported on 12/02/2022)     desoximetasone (TOPICORT) 0.25 % cream Apply 1 application. topically 2 (two)  times daily. (Patient not taking: Reported on 12/02/2022) 60 g 11   No current facility-administered medications for this visit.      Marland Kitchen  PHYSICAL EXAMINATION: ECOG PERFORMANCE STATUS: 0 - Asymptomatic  Vitals:   12/02/22 1449  BP: (!) 145/67  Pulse: 76  Resp: 18  SpO2: 99%   Filed Weights   12/02/22 1447  Weight: 165 lb (74.8 kg)    Physical Exam Constitutional:      Comments: Alone.  Walking independently.  HENT:     Head: Normocephalic and atraumatic.     Mouth/Throat:     Pharynx: No oropharyngeal exudate.  Eyes:     Pupils: Pupils are equal, round, and reactive to light.  Cardiovascular:     Rate and Rhythm: Normal rate and regular rhythm.  Pulmonary:     Effort: Pulmonary effort is normal. No respiratory distress.     Breath sounds: Normal breath sounds. No wheezing.  Abdominal:     General: Bowel sounds are normal. There is no distension.     Palpations: Abdomen is soft. There is no mass.     Tenderness: There is no abdominal tenderness. There is no guarding or rebound.  Musculoskeletal:        General: No tenderness. Normal range of motion.     Cervical back: Normal range of motion and neck supple.  Skin:    General: Skin is warm.  Neurological:     Mental Status: She is alert and oriented to person, place, and time.  Psychiatric:        Mood and Affect: Affect normal.     LABORATORY  DATA:  I have reviewed the data as listed Lab Results  Component Value Date   WBC 8.0 11/30/2020   HGB 13.2 11/30/2020   HCT 39.0 11/30/2020   MCV 92.9 11/30/2020   PLT 197 11/30/2020   No results for input(s): "NA", "K", "CL", "CO2", "GLUCOSE", "BUN", "CREATININE", "CALCIUM", "GFRNONAA", "GFRAA", "PROT", "ALBUMIN", "AST", "ALT", "ALKPHOS", "BILITOT", "BILIDIR", "IBILI" in the last 8760 hours.   RADIOGRAPHIC STUDIES: I have personally reviewed the radiological images as listed and agreed with the findings in the report. No results found.  ASSESSMENT & PLAN:    Carcinoma of overlapping sites of left breast in female, estrogen receptor positive (HCC) # Left breast cancer-stage I ER/PR; her 2 neg. high risk MammaPrint status post chemotherapy.  Patient discontinued adjuvant endocrine therapy including-anastrozole; letrozole; exemestane [May 2022]-because of side effects/poor tolerance.   Discussed option of using tamoxifen.  However patient declined-stating that she will take the risk rather than bear the side effects of the pills.  Discussed that endocrine therapy is used/to prevent development of distant metastatic disease.  Patient understands that it is quite possible that she has distant recurrence-this would be most likely stage IV cancer incurable cancer.  Discussed the option of imaging-however patient states that she does not have any symptoms-hold off any further imaging at this time.   # Left knee orthopedic surgery/Dr.Pogi [ stopped sec to upcoming]; Left shoulder   # Osteopenia- T score -2.0 [June 2021]- next summer previously declined prolia; continue ca+vit D. BID.   # ? sjogrens [on neck CT- follow up CT chest in June 2022; Dr.A  # CKD stage III- monitor for now.   # DISPOSITION: # follow up as needed- Dr.B  All questions were answered. The patient knows to call the clinic with any problems, questions or concerns.    Earna Coder, MD 12/02/2022 4:33 PM

## 2022-12-02 NOTE — Assessment & Plan Note (Addendum)
#   Left breast cancer-stage I ER/PR; her 2 neg. high risk MammaPrint status post chemotherapy.  Patient discontinued adjuvant endocrine therapy including-anastrozole; letrozole; exemestane [May 2022]-because of side effects/poor tolerance.   Discussed option of using tamoxifen.  However patient declined-stating that she will take the risk rather than bear the side effects of the pills.  Discussed that endocrine therapy is used/to prevent development of distant metastatic disease.  Patient understands that it is quite possible that she has distant recurrence-this would be most likely stage IV cancer incurable cancer.  Discussed the option of imaging-however patient states that she does not have any symptoms-hold off any further imaging at this time.   # Left knee orthopedic surgery/Dr.Pogi [ stopped sec to upcoming]; Left shoulder   # Osteopenia- T score -2.0 [June 2021]- next summer previously declined prolia; continue ca+vit D. BID.   # ? sjogrens [on neck CT- follow up CT chest in June 2022; Dr.A  # CKD stage III- monitor for now.   # DISPOSITION: # follow up as needed- Dr.B

## 2023-01-14 ENCOUNTER — Ambulatory Visit: Payer: BLUE CROSS/BLUE SHIELD | Admitting: Dermatology

## 2023-01-23 ENCOUNTER — Encounter: Payer: Self-pay | Admitting: Hematology and Oncology

## 2023-01-23 ENCOUNTER — Other Ambulatory Visit: Payer: Self-pay | Admitting: Nurse Practitioner

## 2023-01-23 DIAGNOSIS — R1032 Left lower quadrant pain: Secondary | ICD-10-CM

## 2023-01-23 DIAGNOSIS — K219 Gastro-esophageal reflux disease without esophagitis: Secondary | ICD-10-CM

## 2023-01-28 ENCOUNTER — Ambulatory Visit
Admission: RE | Admit: 2023-01-28 | Discharge: 2023-01-28 | Disposition: A | Discharge status: Home / Self Care | Payer: Medicare Other | Source: Ambulatory Visit | Attending: Nurse Practitioner | Admitting: Nurse Practitioner

## 2023-01-28 DIAGNOSIS — K219 Gastro-esophageal reflux disease without esophagitis: Secondary | ICD-10-CM | POA: Diagnosis present

## 2023-01-28 DIAGNOSIS — R1032 Left lower quadrant pain: Secondary | ICD-10-CM | POA: Insufficient documentation

## 2023-01-28 MED ORDER — IOHEXOL 300 MG/ML  SOLN
100.0000 mL | Freq: Once | INTRAMUSCULAR | Status: AC | PRN
  Administered 2023-01-28: 100 mL via INTRAVENOUS

## 2023-03-12 ENCOUNTER — Encounter: Payer: Self-pay | Admitting: Hematology and Oncology

## 2023-03-12 NOTE — Telephone Encounter (Signed)
 Created in error

## 2023-03-26 ENCOUNTER — Encounter: Payer: Self-pay | Admitting: Hematology and Oncology

## 2023-03-27 ENCOUNTER — Ambulatory Visit
Admission: RE | Admit: 2023-03-27 | Discharge: 2023-03-27 | Disposition: A | Discharge status: Home / Self Care | Payer: Medicare Other | Attending: Gastroenterology | Admitting: Gastroenterology

## 2023-03-27 ENCOUNTER — Encounter
Admission: RE | Disposition: A | Discharge status: Home / Self Care | Payer: Self-pay | Source: Home / Self Care | Attending: Gastroenterology

## 2023-03-27 ENCOUNTER — Ambulatory Visit: Payer: Medicare Other | Admitting: Anesthesiology

## 2023-03-27 DIAGNOSIS — K222 Esophageal obstruction: Secondary | ICD-10-CM | POA: Diagnosis not present

## 2023-03-27 DIAGNOSIS — K317 Polyp of stomach and duodenum: Secondary | ICD-10-CM | POA: Insufficient documentation

## 2023-03-27 DIAGNOSIS — I10 Essential (primary) hypertension: Secondary | ICD-10-CM | POA: Insufficient documentation

## 2023-03-27 DIAGNOSIS — K449 Diaphragmatic hernia without obstruction or gangrene: Secondary | ICD-10-CM | POA: Diagnosis not present

## 2023-03-27 DIAGNOSIS — M1990 Unspecified osteoarthritis, unspecified site: Secondary | ICD-10-CM | POA: Insufficient documentation

## 2023-03-27 DIAGNOSIS — J45909 Unspecified asthma, uncomplicated: Secondary | ICD-10-CM | POA: Insufficient documentation

## 2023-03-27 DIAGNOSIS — M3500 Sicca syndrome, unspecified: Secondary | ICD-10-CM | POA: Diagnosis not present

## 2023-03-27 DIAGNOSIS — K2100 Gastro-esophageal reflux disease with esophagitis, without bleeding: Secondary | ICD-10-CM | POA: Diagnosis present

## 2023-03-27 DIAGNOSIS — Z79899 Other long term (current) drug therapy: Secondary | ICD-10-CM | POA: Diagnosis not present

## 2023-03-27 DIAGNOSIS — R112 Nausea with vomiting, unspecified: Secondary | ICD-10-CM | POA: Insufficient documentation

## 2023-03-27 DIAGNOSIS — R1310 Dysphagia, unspecified: Secondary | ICD-10-CM | POA: Diagnosis present

## 2023-03-27 HISTORY — PX: POLYPECTOMY: SHX5525

## 2023-03-27 HISTORY — PX: BIOPSY: SHX5522

## 2023-03-27 HISTORY — PX: ESOPHAGOGASTRODUODENOSCOPY (EGD) WITH PROPOFOL: SHX5813

## 2023-03-27 HISTORY — PX: ESOPHAGEAL DILATION: SHX303

## 2023-03-27 SURGERY — ESOPHAGOGASTRODUODENOSCOPY (EGD) WITH PROPOFOL
Anesthesia: General

## 2023-03-27 MED ORDER — DEXMEDETOMIDINE HCL IN NACL 80 MCG/20ML IV SOLN
INTRAVENOUS | Status: DC | PRN
  Administered 2023-03-27: 20 ug via INTRAVENOUS

## 2023-03-27 MED ORDER — GLYCOPYRROLATE 0.2 MG/ML IJ SOLN
INTRAMUSCULAR | Status: DC | PRN
  Administered 2023-03-27: .2 mg via INTRAVENOUS

## 2023-03-27 MED ORDER — PROPOFOL 500 MG/50ML IV EMUL
INTRAVENOUS | Status: DC | PRN
  Administered 2023-03-27: 125 ug/kg/min via INTRAVENOUS

## 2023-03-27 MED ORDER — PROPOFOL 10 MG/ML IV BOLUS
INTRAVENOUS | Status: DC | PRN
  Administered 2023-03-27: 50 mg via INTRAVENOUS
  Administered 2023-03-27: 30 mg via INTRAVENOUS
  Administered 2023-03-27: 20 mg via INTRAVENOUS

## 2023-03-27 MED ORDER — ALBUTEROL SULFATE HFA 108 (90 BASE) MCG/ACT IN AERS
INHALATION_SPRAY | RESPIRATORY_TRACT | Status: DC | PRN
  Administered 2023-03-27: 6 via RESPIRATORY_TRACT

## 2023-03-27 MED ORDER — SODIUM CHLORIDE 0.9 % IV SOLN
INTRAVENOUS | Status: DC
  Administered 2023-03-27: 500 mL via INTRAVENOUS

## 2023-03-27 MED ORDER — LIDOCAINE HCL (CARDIAC) PF 100 MG/5ML IV SOSY
PREFILLED_SYRINGE | INTRAVENOUS | Status: DC | PRN
  Administered 2023-03-27: 60 mg via INTRAVENOUS

## 2023-03-27 NOTE — Anesthesia Preprocedure Evaluation (Signed)
Anesthesia Evaluation  Patient identified by MRN, date of birth, ID band Patient awake    Reviewed: Allergy & Precautions, NPO status , Patient's Chart, lab work & pertinent test results  History of Anesthesia Complications (+) PONV and history of anesthetic complications  Airway Mallampati: III  TM Distance: >3 FB Neck ROM: Full    Dental  (+) Teeth Intact, Caps, Dental Advisory Given   Pulmonary neg pulmonary ROS, asthma    Pulmonary exam normal breath sounds clear to auscultation       Cardiovascular Exercise Tolerance: Good hypertension, Pt. on medications negative cardio ROS Normal cardiovascular exam Rhythm:Regular Rate:Normal     Neuro/Psych negative neurological ROS  negative psych ROS   GI/Hepatic negative GI ROS, Neg liver ROS,GERD  Medicated,,  Endo/Other  negative endocrine ROS    Renal/GU negative Renal ROS  negative genitourinary   Musculoskeletal   Abdominal   Peds negative pediatric ROS (+)  Hematology negative hematology ROS (+)   Anesthesia Other Findings Past Medical History: No date: Anemia No date: Arthritis No date: Asthma     Comment:  H/O YEARS AGO-NOW HAS COUGHING SPELLS WHICH IS WHY SHE               HAS HER ALBUTEROL INHALER No date: Basal cell carcinoma     Comment:  R nose (MOHS) 01/29/2016: Basal cell carcinoma     Comment:  Right forehead above med brow 10/12/2019: Basal cell carcinoma     Comment:  L nasal tip - MOHS 05/15/2020 08/12/2017: BRCA gene mutation negative     Comment:  Variant of unknown significance at [facility] only abnormality. 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                MammaPrint: High risk. No date: Cancer [facility])     Comment:  [phone] basal cell carcinoma No date: Complication of anesthesia     Comment:  HARD TIME  WAKING UP 02/21/2010: Dysplastic nevus     Comment:  Right mid pretibial, mild No date: Dysrhythmia     Comment:  TACHYCARDIA No date: GERD (gastroesophageal reflux disease) No date: History of hiatal hernia     Comment:  SMALL No date: Hyperlipidemia No date: Hypertension     Comment:  PT STATES IN 01-18-18 PREOP PHONE INTERVIEW THAT HER BP               HAS BEEN ELEVATED SINCE STARTING ON VALSARTAN- PCP               SWITCHED PT BACK TO MAXIDE ON 01-18-18 AND PT STATES THAT              SHE HAS LOST 6-10 POUNDS OF FLUID SINCE RESTARTING               MAXIDE.   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 08/2017: BREAST BIOPSY; Right     Comment:  MRI biopsy, FIBROCYSTIC CHANGE AND USUAL DUCTAL  HYPERPLASIA,, clip placed x3: BREAST CYST EXCISION No date: BREAST EXCISIONAL BIOPSY; Bilateral 01/27/2018: BREAST RECONSTRUCTION WITH PLACEMENT OF TISSUE EXPANDER  AND FLEX HD (ACELLULAR HYDRATED DERMIS); Left     Comment:  Procedure: BREAST RECONSTRUCTION WITH PLACEMENT OF               TISSUE EXPANDER AND FLEX HD (ACELLULAR HYDRATED DERMIS);               Surgeon: Peggye Form, DO;  Location: [facility] ORS;                Service: Plastics;  Laterality: Left; 02/13/14: BREAST SURGERY; Left     Comment:  Fibrocystic changes, pseudo-angiomatous stromal               hyperplasia. No atypia or malignancy. x2: CARPAL TUNNEL RELEASE; Bilateral 2008: CHOLECYSTECTOMY 01/12/2015: COLONOSCOPY WITH PROPOFOL; N/A     Comment:  Procedure: COLONOSCOPY WITH PROPOFOL;  Surgeon: Scot Jun, MD;  Location: [facility] ENDOSCOPY;  Service:                Endoscopy;  Laterality: N/A; 02/06/2020: COLONOSCOPY WITH PROPOFOL; N/A     Comment:  Procedure: COLONOSCOPY WITH PROPOFOL;  Surgeon:               Regis Bill, MD;  Location: [facility] ENDOSCOPY;                Service: Endoscopy;  Laterality: N/A; 01/12/2015: ESOPHAGOGASTRODUODENOSCOPY (EGD) WITH PROPOFOL; N/A     Comment:  Procedure: ESOPHAGOGASTRODUODENOSCOPY (EGD) WITH               PROPOFOL;  Surgeon: Scot Jun, MD;  Location: [facility]              ENDOSCOPY;  Service: Endoscopy;  Laterality: N/A; 02/06/2020: ESOPHAGOGASTRODUODENOSCOPY (EGD) WITH PROPOFOL; N/A     Comment:  Procedure: ESOPHAGOGASTRODUODENOSCOPY (EGD) WITH               PROPOFOL;  Surgeon: Regis Bill, MD;  Location:               [facility] ENDOSCOPY;  Service: Endoscopy;  Laterality: N/A; 12/23/2018: LIPOSUCTION WITH LIPOFILLING; Left     Comment:  Procedure: LIPOSUCTION WITH LIPOFILLING FROM ABDOMEN TO               LEFT BREAST;  Surgeon: Peggye Form, DO;                Location: [facility];  Service: Plastics;               Laterality: Left;  90 min, please 2019: MASTECTOMY; Left     Comment:  [facility] 01/27/2018: MASTECTOMY W/ SENTINEL NODE BIOPSY; Left     Comment:  ypT1c ypN0; ER 90%, PR 20%, HER-2/neu not overexpressed.              MASTECTOMY WITH SENTINEL LYMPH NODE BIOPSY;  Surgeon:               Earline Mayotte, MD;  Location: [facility] ORS;  Service:               General;  Laterality: Left; 09/02/2017: MINOR BREAST BIOPSY; Right     Comment:  Radiology perform procedure, fibrocystic changes right               retroareolar area. 1983: NASAL RECONSTRUCTION No  date: OOPHORECTOMY No date: PORT-A-CATH REMOVAL 10/21/2017: PORTACATH PLACEMENT; N/A     Comment:  Procedure: INSERTION PORT-A-CATH;  Surgeon: Earline Mayotte, MD;  Location: [facility] ORS;  Service: General;                Laterality: N/A; 05/13/2018: REMOVAL OF TISSUE EXPANDER AND PLACEMENT OF  IMPLANT; Left     Comment:  Procedure: REMOVAL OF TISSUE EXPANDER AND PLACEMENT OF               IMPLANT;  Surgeon: Peggye Form, DO;  Location:               [facility] ORS;  Service: Plastics;  Laterality: Left;  total               surgery time should be 3 hours, per provider 11/27/2020: REVERSE SHOULDER ARTHROPLASTY; Right     Comment:  Procedure: REVERSE SHOULDER ARTHROPLASTY;  Surgeon:               Christena Flake, MD;  Location: [facility] ORS;  Service:               Orthopedics;  Laterality: Right; 01/12/2015: SAVORY DILATION; N/A     Comment:  Procedure: SAVORY DILATION;  Surgeon: Scot Jun,               MD;  Location: [facility] ENDOSCOPY;  Service: Endoscopy;                Laterality: N/A; 1956: TONSILLECTOMY AND ADENOIDECTOMY  BMI    Body Mass Index: 27.01 kg/m      Reproductive/Obstetrics negative OB ROS                             Anesthesia Physical Anesthesia Plan  ASA: 2  Anesthesia Plan: General   Post-op Pain Management:    Induction: Intravenous  PONV Risk Score and Plan: Propofol infusion and TIVA  Airway Management Planned: Natural Airway and Nasal Cannula  Additional Equipment:   Intra-op Plan:   Post-operative Plan:   Informed Consent: I have reviewed the patients History and Physical, chart, labs and discussed the procedure including the risks, benefits and alternatives for the proposed anesthesia with the patient or authorized representative who has indicated his/her understanding and acceptance.     Dental Advisory Given  Plan Discussed with: CRNA and Surgeon  Anesthesia Plan Comments:        Anesthesia Quick Evaluation

## 2023-03-27 NOTE — Op Note (Signed)
[facility] Gastroenterology Patient Name: Mariah Hogan Procedure Date: 03/27/2023 9:46 AM MRN: [id number] Account #: [id number] Date of Birth: 07/08/1950 Admit Type: Outpatient Age: 73 Room: [facility] ENDO ROOM 3 Gender: Female Note Status: Finalized Instrument Name: Upper Endoscope [id number] Procedure:             Upper GI endoscopy Indications:           Dysphagia, Gastro-esophageal reflux disease, Nausea                         with vomiting Providers:             Eather Colas MD, MD Referring MD:          Marilynne Halsted, MD (Referring MD) Medicines:             Monitored Anesthesia Care Complications:         No immediate complications. Estimated blood loss:                         Minimal. Procedure:             Pre-Anesthesia Assessment:                        - Prior to the procedure, a History and Physical was                         performed, and patient medications and allergies were                         reviewed. The patient is competent. The risks and                         benefits of the procedure and the sedation options and                         risks were discussed with the patient. All questions                         were answered and informed consent was obtained.                         Patient identification and proposed procedure were                         verified by the physician, the nurse, the                         anesthesiologist, the anesthetist and the technician                         in the endoscopy suite. Mental Status Examination:                         alert and oriented. Airway Examination: normal                         oropharyngeal airway and neck mobility. Respiratory  Examination: clear to auscultation. CV Examination:                         normal. Prophylactic Antibiotics: The patient does not                         require prophylactic antibiotics. Prior                          Anticoagulants: The patient has taken no anticoagulant                         or antiplatelet agents. ASA Grade Assessment: II - A                         patient with mild systemic disease. After reviewing                         the risks and benefits, the patient was deemed in                         satisfactory condition to undergo the procedure. The                         anesthesia plan was to use monitored anesthesia care                         (MAC). Immediately prior to administration of                         medications, the patient was re-assessed for adequacy                         to receive sedatives. The heart rate, respiratory                         rate, oxygen saturations, blood pressure, adequacy of                         pulmonary ventilation, and response to care were                         monitored throughout the procedure. The physical                         status of the patient was re-assessed after the                         procedure.                        After obtaining informed consent, the endoscope was                         passed under direct vision. Throughout the procedure,                         the patient's blood pressure, pulse, and oxygen  saturations were monitored continuously. The Endoscope                         was introduced through the mouth, and advanced to the                         second part of duodenum. The upper GI endoscopy was                         accomplished without difficulty. The patient tolerated                         the procedure well. Findings:      The lower third of the esophagus was mildly tortuous.      A non-obstructing Schatzki ring was found in the lower third of the       esophagus. A TTS dilator was passed through the scope. Dilation with a       15-16.5-18 mm balloon dilator was performed to 18 mm. The dilation site       was examined and showed moderate mucosal  disruption. Estimated blood       loss was minimal.      A 3 cm hiatal hernia was present.      Normal mucosa was found in the entire esophagus. Biopsies were obtained       from the proximal and distal esophagus with cold forceps for histology       of suspected eosinophilic esophagitis. Estimated blood loss was minimal.      Multiple 2 to 10 mm sessile fundic gland polyps with no bleeding and no       stigmata of recent bleeding were found in the gastric fundus and in the       gastric body. Biopsies were taken with a cold forceps for histology.       Estimated blood loss was minimal.      The examined duodenum was normal. Impression:            - Tortuous esophagus.                        - Non-obstructing Schatzki ring. Dilated.                        - 3 cm hiatal hernia.                        - Normal mucosa was found in the entire esophagus.                        - Multiple fundic gland polyps. Biopsied.                        - Normal examined duodenum.                        - Biopsies were taken with a cold forceps for                         evaluation of eosinophilic esophagitis. Recommendation:        - Discharge patient to home.                        -  Resume previous diet.                        - Continue present medications.                        - Await pathology results.                        - Return to referring physician as previously                         scheduled. Procedure Code(s):     --- Professional ---                        [phone], Esophagogastroduodenoscopy, flexible,                         transoral; with transendoscopic balloon dilation of                         esophagus (less than 30 mm diameter) Diagnosis Code(s):     --- Professional ---                        Q39.9, Congenital malformation of esophagus,                         unspecified                        K22.2, Esophageal obstruction                        K44.9, Diaphragmatic  hernia without obstruction or                         gangrene                        K31.7, Polyp of stomach and duodenum                        R13.10, Dysphagia, unspecified                        K21.9, Gastro-esophageal reflux disease without                         esophagitis                        R11.2, Nausea with vomiting, unspecified CPT copyright 2022 American Medical Association. All rights reserved. The codes documented in this report are preliminary and upon coder review may  be revised to meet current compliance requirements. Eather Colas MD, MD 03/27/2023 10:26:47 AM Number of Addenda: 0 Note Initiated On: 03/27/2023 9:46 AM Estimated Blood Loss:  Estimated blood loss was minimal.      [facility]

## 2023-03-27 NOTE — Anesthesia Postprocedure Evaluation (Signed)
Anesthesia Post Note  Patient: ROSABELLA NEIHEISEL  Procedure(s) Performed: ESOPHAGOGASTRODUODENOSCOPY (EGD) WITH PROPOFOL ESOPHAGEAL DILATION POLYPECTOMY BIOPSY  Patient location during evaluation: PACU Anesthesia Type: General Level of consciousness: awake and awake and alert Vital Signs Assessment: post-procedure vital signs reviewed and stable Respiratory status: spontaneous breathing and nonlabored ventilation Cardiovascular status: blood pressure returned to baseline Anesthetic complications: no   No notable events documented.   Last Vitals:  Vitals:   03/27/23 0926 03/27/23 1027  BP: (!) 164/84 (!) 145/77  Pulse: 87   Resp: 18 14  Temp: (!) 36.1 C (!) 36.2 C  SpO2: 99%     Last Pain:  Vitals:   03/27/23 1027  TempSrc:   PainSc: 0-No pain                 VAN STAVEREN,Neria Procter

## 2023-03-27 NOTE — Interval H&P Note (Signed)
History and Physical Interval Note:  03/27/2023 9:54 AM  Mariah Hogan  has presented today for surgery, with the diagnosis of GERD Dysphagia N/V.  The various methods of treatment have been discussed with the patient and family. After consideration of risks, benefits and other options for treatment, the patient has consented to  Procedure(s): ESOPHAGOGASTRODUODENOSCOPY (EGD) WITH PROPOFOL (N/A) as a surgical intervention.  The patient's history has been reviewed, patient examined, no change in status, stable for surgery.  I have reviewed the patient's chart and labs.  Questions were answered to the patient's satisfaction.     Regis Bill  Ok to proceed with EGD

## 2023-03-27 NOTE — H&P (Signed)
Outpatient short stay form Pre-procedure 03/27/2023  Regis Bill, MD  Primary Physician: Patrice Paradise, MD  Reason for visit:  Dysphagia/Nausea/Vomiting  History of present illness:    73 y/o lady with history of hiatal hernia, hypertension, asthma, Sjogren's, arthritis, and GERD here for EGD for dysphagia/vomiting/chest pain. Last EGD in 2021 with non-obstructing schatzki ring dilated to 18 mm and 5 cm hiatal hernia. No blood thinners. No family history of GI malignancies. No neck surgeries. No opioid pain medicines.    Current Facility-Administered Medications:    0.9 %  sodium chloride infusion, , Intravenous, Continuous, Kem Hensen, Rossie Muskrat, MD, Last Rate: 20 mL/hr at 03/27/23 0938, 500 mL at 03/27/23 0938  Medications Prior to Admission  Medication Sig Dispense Refill Last Dose/Taking   ascorbic acid (VITAMIN C) 500 MG tablet Take by mouth.   Past Week   atenolol (TENORMIN) 25 MG tablet Take 0.5 tablets (12.5 mg total) by mouth 2 (two) times daily. 30 tablet 0 03/26/2023 Evening   calcium carbonate (OSCAL) 1500 (600 Ca) MG TABS tablet Take 600 mg of elemental calcium by mouth daily with breakfast.   Past Week   Cholecalciferol (VITAMIN D) 50 MCG (2000 UT) CAPS Take 2,000 Units by mouth daily.    Past Week   citalopram (CELEXA) 20 MG tablet Take 20 mg by mouth at bedtime.    03/26/2023 Evening   cyanocobalamin 1000 MCG tablet Take by mouth.   Past Week   Multiple Vitamin (MULTI-VITAMIN) tablet Take by mouth.   Past Week   pantoprazole (PROTONIX) 40 MG tablet Take 40 mg by mouth 2 (two) times daily.   03/26/2023 Morning   PREMARIN vaginal cream Place 1 Applicatorful vaginally once a week.    Past Week   triamterene-hydrochlorothiazide (MAXZIDE-25) 37.5-25 MG tablet Take 1 tablet by mouth daily. 30 tablet 0 03/26/2023 Morning   valACYclovir (VALTREX) 1000 MG tablet Take 1 tablet (1,000 mg total) by mouth as directed. 30 tablet 11 Past Month   albuterol (VENTOLIN HFA) 108 (90  Base) MCG/ACT inhaler Inhale into the lungs. (Patient not taking: Reported on 12/02/2022)      desoximetasone (TOPICORT) 0.25 % cream Apply 1 application. topically 2 (two) times daily. (Patient not taking: Reported on 12/02/2022) 60 g 11    hydroxychloroquine (PLAQUENIL) 200 MG tablet Take 200 mg by mouth daily.        Allergies  Allergen Reactions   Cytoxan [Cyclophosphamide] Shortness Of Breath    BACK PAIN, BACK SPASM   Penicillin G Shortness Of Breath, Swelling and Rash    Tolerated 1st generation cephalosporin (CEFAZOLIN) on 10/21/17 and 01/27/18 without ADRs.  PCN reaction causing immediate rash, facial/tongue/throat swelling, SOB or lightheadedness with hypotension: Yes PCN reaction causing severe rash involving mucus membranes or skin necrosis: No PCN reaction that required hospitalization: No PCN reaction occurring within the last 10 years: No If all of the above answers are "NO", then may proceed with Cephalosporin use.   Taxotere [Docetaxel] Shortness Of Breath, Palpitations and Other (See Comments)    Back pain, Back Spasms   Other Other (See Comments)    Dodecyl gallate: Positive patch test  Elta MD UV Clear SPF 46: Positive patch test  fragrance    Parabens Other (See Comments)    Positive patch test    Shellac Other (See Comments)    Positive patch test    Ciprofloxacin Hives, Itching and Rash   Fluocinonide Rash   Tape Dermatitis    Paper tape is ok,  tegaderm OK    Triamcinolone Rash     Past Medical History:  Diagnosis Date   Anemia    Arthritis    Asthma    H/O YEARS AGO-NOW HAS COUGHING SPELLS WHICH IS WHY SHE HAS HER ALBUTEROL INHALER   Basal cell carcinoma    R nose (MOHS)   Basal cell carcinoma 01/29/2016   Right forehead above med brow   Basal cell carcinoma 10/12/2019   L nasal tip - MOHS 05/15/2020   BRCA gene mutation negative 08/12/2017   Variant of unknown significance at [facility] only abnormality.   Breast cancer (HCC) 08/04/2017   1.8 cm ER  90%, PR 20%, HER-2/neu negative invasive mammary carcinoma of the left upper outer quadrant.  MammaPrint: High risk.   Cancer (HCC)    [phone] basal cell carcinoma   Complication of anesthesia    HARD TIME WAKING UP   Dysplastic nevus 02/21/2010   Right mid pretibial, mild   Dysrhythmia    TACHYCARDIA   GERD (gastroesophageal reflux disease)    History of hiatal hernia    SMALL   Hyperlipidemia    Hypertension    PT STATES IN 01-18-18 PREOP PHONE INTERVIEW THAT HER BP HAS BEEN ELEVATED SINCE STARTING ON VALSARTAN- PCP SWITCHED PT BACK TO MAXIDE ON 01-18-18 AND PT STATES THAT SHE HAS LOST 6-10 POUNDS OF FLUID SINCE RESTARTING MAXIDE.     Personal history of chemotherapy    PONV (postoperative nausea and vomiting)    Sjogren's syndrome (HCC) 07/2020    Review of systems:  Otherwise negative.    Physical Exam  Gen: Alert, oriented. Appears stated age.  HEENT: PERRLA. Lungs: No respiratory distress CV: RRR Abd: soft, benign, no masses Ext: No edema    Planned procedures: Proceed with EGD. The patient understands the nature of the planned procedure, indications, risks, alternatives and potential complications including but not limited to bleeding, infection, perforation, damage to internal organs and possible oversedation/side effects from anesthesia. The patient agrees and gives consent to proceed.  Please refer to procedure notes for findings, recommendations and patient disposition/instructions.     Regis Bill, MD [facility] Gastroenterology

## 2023-03-27 NOTE — Transfer of Care (Signed)
Immediate Anesthesia Transfer of Care Note  Patient: Mariah Hogan  Procedure(s) Performed: ESOPHAGOGASTRODUODENOSCOPY (EGD) WITH PROPOFOL ESOPHAGEAL DILATION POLYPECTOMY BIOPSY  Patient Location: PACU  Anesthesia Type:General  Level of Consciousness: sedated  Airway & Oxygen Therapy: Patient Spontanous Breathing  Post-op Assessment: Report given to RN and Post -op Vital signs reviewed and stable  Post vital signs: Reviewed and stable  Last Vitals:  Vitals Value Taken Time  BP 145/77 03/27/23 1027  Temp 36.2 C 03/27/23 1027  Pulse 102 03/27/23 1028  Resp 14 03/27/23 1027  SpO2 99 % 03/27/23 1028  Vitals shown include unfiled device data.  Last Pain:  Vitals:   03/27/23 1027  TempSrc:   PainSc: 0-No pain         Complications: No notable events documented.

## 2023-03-30 ENCOUNTER — Encounter: Payer: Self-pay | Admitting: Gastroenterology

## 2023-03-30 LAB — SURGICAL PATHOLOGY

## 2023-04-10 ENCOUNTER — Encounter (INDEPENDENT_AMBULATORY_CARE_PROVIDER_SITE_OTHER): Payer: Medicare Other | Admitting: Vascular Surgery

## 2023-04-14 ENCOUNTER — Ambulatory Visit (INDEPENDENT_AMBULATORY_CARE_PROVIDER_SITE_OTHER): Payer: Medicare Other | Admitting: Vascular Surgery

## 2023-04-14 ENCOUNTER — Encounter (INDEPENDENT_AMBULATORY_CARE_PROVIDER_SITE_OTHER): Payer: Self-pay | Admitting: Vascular Surgery

## 2023-04-14 VITALS — BP 113/73 | HR 100 | Resp 18 | Wt 168.4 lb

## 2023-04-14 DIAGNOSIS — I10 Essential (primary) hypertension: Secondary | ICD-10-CM | POA: Diagnosis not present

## 2023-04-14 DIAGNOSIS — I728 Aneurysm of other specified arteries: Secondary | ICD-10-CM | POA: Diagnosis not present

## 2023-04-14 NOTE — Progress Notes (Signed)
 Patient ID: Mariah Hogan, female   DOB: 18-Apr-1950, 73 y.o.   MRN: [id number]  Chief Complaint  Patient presents with   New Patient (Initial Visit)    Ref Moishe consult splenic artery aneurysm on    HPI Mariah Hogan is a 73 y.o. female.  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  I have independently reviewed the CT scan.  It was a general abdomen pelvis CT with 5 mm cuts making precise sizing somewhat difficult to evaluate.  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  Patient does not have any aneurysm related symptoms.  Specifically, she does not have back or abdominal pain or any signs of peripheral embolization.  She is beyond childbearing years.  She has no strong family history of aneurysm to her knowledge.     Past Medical History:  Diagnosis Date   Anemia    Arthritis    Asthma    H/O YEARS AGO-NOW HAS COUGHING SPELLS WHICH IS WHY SHE HAS HER ALBUTEROL  INHALER   Basal cell carcinoma    R nose ([facility])   Basal cell carcinoma 01/29/2016   Right forehead above med brow   Basal cell carcinoma 10/12/2019   L nasal tip - [facility] 05/15/2020   BRCA gene mutation negative 08/12/2017   Variant of unknown significance at APC only abnormality.   Breast cancer (HCC) 08/04/2017   1.8 cm ER 90%, PR 20%, HER-2/neu negative invasive mammary carcinoma of the left upper outer quadrant.  MammaPrint: High risk.   Cancer (HCC)    [phone] basal cell carcinoma   Complication of anesthesia    HARD TIME WAKING UP   Dysplastic nevus 02/21/2010   Right mid pretibial, mild   Dysrhythmia    TACHYCARDIA   GERD (gastroesophageal reflux disease)    History of hiatal hernia    SMALL   Hyperlipidemia    Hypertension    PT STATES IN 01-18-18 PREOP PHONE INTERVIEW THAT HER BP HAS BEEN ELEVATED SINCE STARTING ON VALSARTAN- PCP SWITCHED PT BACK  TO MAXIDE ON 01-18-18 AND PT STATES THAT SHE HAS LOST 6-10 POUNDS OF FLUID SINCE RESTARTING MAXIDE.     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   BREAST BIOPSY Right 08/2017   MRI biopsy, FIBROCYSTIC CHANGE AND USUAL DUCTAL HYPERPLASIA,, clip placed   BREAST CYST EXCISION  x3   BREAST EXCISIONAL BIOPSY Bilateral    BREAST RECONSTRUCTION WITH PLACEMENT OF TISSUE EXPANDER AND FLEX HD (ACELLULAR HYDRATED DERMIS) Left 01/27/2018   Procedure: BREAST RECONSTRUCTION WITH PLACEMENT OF TISSUE EXPANDER AND FLEX HD (ACELLULAR HYDRATED DERMIS);  Surgeon: Lowery Estefana RAMAN, DO;  Location: [facility];  Service: Plastics;  Laterality: Left;   BREAST SURGERY Left 02/13/14   Fibrocystic changes, pseudo-angiomatous stromal hyperplasia. No atypia or malignancy.   CARPAL TUNNEL RELEASE  Bilateral x2   CHOLECYSTECTOMY  2008   COLONOSCOPY WITH PROPOFOL  N/A 01/12/2015   Procedure: COLONOSCOPY WITH PROPOFOL ;  Surgeon: Lamar ONEIDA Holmes, MD;  Location: [facility] ENDOSCOPY;  Service: Endoscopy;  Laterality: N/A;   COLONOSCOPY WITH PROPOFOL  N/A 02/06/2020   Procedure: COLONOSCOPY WITH PROPOFOL ;  Surgeon: Maryruth Ole ONEIDA, MD;  Location: [facility];  Service: Endoscopy;  Laterality: N/A;   ESOPHAGEAL DILATION  03/27/2023   Procedure: ESOPHAGEAL DILATION;  Surgeon: Maryruth Ole ONEIDA, MD;  Location: [facility];  Service: Endoscopy;;   ESOPHAGOGASTRODUODENOSCOPY (EGD) WITH PROPOFOL  N/A 01/12/2015   Procedure: ESOPHAGOGASTRODUODENOSCOPY (EGD) WITH PROPOFOL ;   Surgeon: Lamar ONEIDA Holmes, MD;  Location: [facility] ENDOSCOPY;  Service: Endoscopy;  Laterality: N/A;   ESOPHAGOGASTRODUODENOSCOPY (EGD) WITH PROPOFOL  N/A 02/06/2020   Procedure: ESOPHAGOGASTRODUODENOSCOPY (EGD) WITH PROPOFOL ;  Surgeon: Maryruth Ole ONEIDA, MD;  Location: [facility];  Service: Endoscopy;  Laterality: N/A;   ESOPHAGOGASTRODUODENOSCOPY (EGD) WITH PROPOFOL  N/A 03/27/2023   Procedure: ESOPHAGOGASTRODUODENOSCOPY (EGD) WITH PROPOFOL ;  Surgeon: Maryruth Ole ONEIDA, MD;  Location: [facility];  Service: Endoscopy;  Laterality: N/A;   LIPOSUCTION WITH LIPOFILLING Left 12/23/2018   Procedure: LIPOSUCTION WITH LIPOFILLING FROM ABDOMEN TO LEFT BREAST;  Surgeon: Lowery Estefana RAMAN, DO;  Location: [facility];  Service: Plastics;  Laterality: Left;  90 min, please   MASTECTOMY Left 2019   [facility]   MASTECTOMY W/ SENTINEL NODE BIOPSY Left 01/27/2018   ypT1c ypN0; ER 90%, PR 20%, HER-2/neu not overexpressed.  MASTECTOMY WITH SENTINEL LYMPH NODE BIOPSY;  Surgeon: Dessa Reyes ORN, MD;  Location: [facility];  Service: General;  Laterality: Left;   MINOR BREAST BIOPSY Right 09/02/2017   Radiology perform procedure, fibrocystic changes right retroareolar area.   NASAL RECONSTRUCTION  1983   OOPHORECTOMY     POLYPECTOMY  03/27/2023   Procedure: POLYPECTOMY;  Surgeon: Maryruth Ole ONEIDA, MD;  Location: [facility];  Service: Endoscopy;;   PORT-A-CATH REMOVAL     PORTACATH PLACEMENT N/A 10/21/2017   Procedure: INSERTION PORT-A-CATH;  Surgeon: Dessa Reyes ORN, MD;  Location: [facility];  Service: General;  Laterality: N/A;   REMOVAL OF TISSUE EXPANDER AND PLACEMENT OF IMPLANT Left 05/13/2018   Procedure: REMOVAL OF TISSUE EXPANDER AND PLACEMENT OF IMPLANT;  Surgeon: Lowery Estefana RAMAN, DO;  Location: [facility];  Service: Plastics;  Laterality: Left;  total surgery time should be 3 hours, per provider   REVERSE SHOULDER ARTHROPLASTY Right 11/27/2020   Procedure: REVERSE SHOULDER  ARTHROPLASTY;  Surgeon: Edie Norleen PARAS, MD;  Location: [facility];  Service: Orthopedics;  Laterality: Right;   SAVORY DILATION N/A 01/12/2015   Procedure: SAVORY DILATION;  Surgeon: Lamar ONEIDA Holmes, MD;  Location: [facility] ENDOSCOPY;  Service: Endoscopy;  Laterality: N/A;   TONSILLECTOMY AND ADENOIDECTOMY  1956     Family History  Problem Relation Age of Onset   Breast cancer Sister 33       currently 28   Breast cancer Paternal Grandmother 3       bilateral; deceased 69s    Breast cancer Cousin 70       daughter of an unaffected maternal aunt   Breast cancer Cousin 64       kidney cancer also; daughter of an unaffected maternal aunt   Other Mother        TAH/BSO prior to menopause   Prostate cancer Paternal Grandfather        age at dx unknown   Breast cancer Maternal Aunt        age at dx  unknown   Breast cancer Cousin 34       daughter of an unaffected maternal aunt      Social History   Tobacco Use   Smoking status: Never   Smokeless tobacco: Never  Vaping Use   Vaping status: Never Used  Substance Use Topics   Alcohol use: No    Alcohol/week: 0.0 standard drinks of alcohol   Drug use: No     Allergies  Allergen Reactions   Cytoxan  [Cyclophosphamide ] Shortness Of Breath    BACK PAIN, BACK SPASM   Penicillin G Shortness Of Breath, Swelling and Rash    Tolerated 1st generation cephalosporin (CEFAZOLIN ) on 10/21/17 and 01/27/18 without ADRs.  PCN reaction causing immediate rash, facial/tongue/throat swelling, SOB or lightheadedness with hypotension: Yes PCN reaction causing severe rash involving mucus membranes or skin necrosis: No PCN reaction that required hospitalization: No PCN reaction occurring within the last 10 years: No If all of the above answers are NO, then may proceed with Cephalosporin use.   Taxotere  [Docetaxel ] Shortness Of Breath, Palpitations and Other (See Comments)    Back pain, Back Spasms   Other Other (See Comments)    Dodecyl gallate:  Positive patch test  Elta MD UV Clear SPF 46: Positive patch test  fragrance    Parabens Other (See Comments)    Positive patch test    Shellac Other (See Comments)    Positive patch test    Ciprofloxacin  Hives, Itching and Rash   Fluocinonide Rash   Tape Dermatitis    Paper tape is ok, tegaderm OK    Triamcinolone Rash    Current Outpatient Medications  Medication Sig Dispense Refill   ascorbic acid  (VITAMIN C ) 500 MG tablet Take by mouth.     atenolol  (TENORMIN ) 25 MG tablet Take 0.5 tablets (12.5 mg total) by mouth 2 (two) times daily. 30 tablet 0   calcium  carbonate (OSCAL) 1500 (600 Ca) MG TABS tablet Take 600 mg of elemental calcium  by mouth daily with breakfast.     Cholecalciferol  (VITAMIN D ) 50 MCG (2000 UT) CAPS Take 2,000 Units by mouth daily.      citalopram  (CELEXA ) 20 MG tablet Take 20 mg by mouth at bedtime.      cyanocobalamin  1000 MCG tablet Take by mouth.     hydroxychloroquine (PLAQUENIL) 200 MG tablet Take 200 mg by mouth daily.     Multiple Vitamin (MULTI-VITAMIN) tablet Take by mouth.     pantoprazole  (PROTONIX ) 40 MG tablet Take 40 mg by mouth 2 (two) times daily.     PREMARIN vaginal cream Place 1 Applicatorful vaginally once a week.      triamterene -hydrochlorothiazide  (MAXZIDE -25) 37.5-25 MG tablet Take 1 tablet by mouth daily. 30 tablet 0   valACYclovir  (VALTREX ) 1000 MG tablet Take 1 tablet (1,000 mg total) by mouth as directed. 30 tablet 11   albuterol  (VENTOLIN  HFA) 108 (90 Base) MCG/ACT inhaler Inhale into the lungs. (Patient not taking: Reported on 04/14/2023)     desoximetasone  (TOPICORT ) 0.25 % cream Apply 1 application. topically 2 (two) times daily. (Patient not taking: Reported on 04/14/2023) 60 g 11   No current facility-administered medications for this visit.      REVIEW OF SYSTEMS (Negative unless checked)  Constitutional: [] Weight loss  [] Fever  [] Chills Cardiac: [] Chest pain   [] Chest pressure   [x] Palpitations   [] Shortness of breath  when laying flat   [] Shortness of breath at rest   [] Shortness of breath with exertion. Vascular:  [] Pain in legs  with walking   [] Pain in legs at rest   [] Pain in legs when laying flat   [] Claudication   [] Pain in feet when walking  [] Pain in feet at rest  [] Pain in feet when laying flat   [] History of DVT   [] Phlebitis   [] Swelling in legs   [] Varicose veins   [] Non-healing ulcers Pulmonary:   [] Uses home oxygen   [] Productive cough   [] Hemoptysis   [] Wheeze  [] COPD   [x] Asthma Neurologic:  [] Dizziness  [] Blackouts   [] Seizures   [] History of stroke   [] History of TIA  [] Aphasia   [] Temporary blindness   [] Dysphagia   [] Weakness or numbness in arms   [] Weakness or numbness in legs Musculoskeletal:  [x] Arthritis   [] Joint swelling   [x] Joint pain   [] Low back pain Hematologic:  [] Easy bruising  [] Easy bleeding   [] Hypercoagulable state   [x] Anemic  [] Hepatitis Gastrointestinal:  [] Blood in stool   [] Vomiting blood  [x] Gastroesophageal reflux/heartburn   [] Abdominal pain Genitourinary:  [] Chronic kidney disease   [] Difficult urination  [] Frequent urination  [] Burning with urination   [] Hematuria Skin:  [] Rashes   [] Ulcers   [] Wounds Psychological:  [] History of anxiety   []  History of major depression.    Physical Exam BP 113/73   Pulse 100   Resp 18   Wt 168 lb 6.4 oz (76.4 kg)   BMI 27.18 kg/m  Gen:  WD/WN, NAD.  Appears younger than stated age Head: [place]/AT, No temporalis wasting.  Ear/Nose/Throat: Hearing grossly intact, nares w/o erythema or drainage, oropharynx w/o Erythema/Exudate Eyes: Conjunctiva clear, sclera non-icteric  Neck: trachea midline.  No JVD.  Pulmonary:  Good air movement, respirations not labored, no use of accessory muscles  Cardiac: RRR, no JVD Vascular:  Vessel Right Left  Radial Palpable Palpable                                   Gastrointestinal:. No masses, surgical incisions, or scars. Non-tender Musculoskeletal: M/S 5/5 throughout.  Extremities  without ischemic changes.  No deformity or atrophy. No edema. Neurologic: Sensation grossly intact in extremities.  Symmetrical.  Speech is fluent. Motor exam as listed above. Psychiatric: Judgment intact, Mood & affect appropriate for pt's clinical situation. Dermatologic: No rashes or ulcers noted.  No cellulitis or open wounds.    Radiology No results found.  Labs Recent Results (from the past 2160 hours)  Surgical pathology     Status: None   Collection Time: 03/27/23 12:00 AM  Result Value Ref Range   SURGICAL PATHOLOGY      SURGICAL PATHOLOGY [facility] [address], [address] [place], [place] [address] Telephone [phone] or [phone] Fax [phone]  REPORT OF SURGICAL PATHOLOGY   Accession #: [id number] Patient Name: BRITTANEY, BEAULIEU Visit # : [id number]  MRN: [id number] Physician: Maryruth Smalls DOB/Age 07-27-1950 (Age: 52) Gender: F Collected Date: 03/27/2023 Received Date: 03/27/2023  FINAL DIAGNOSIS       1. Stomach, polyp(s), CBX :       - FUNDIC GLAND POLYPS.      - NEGATIVE FOR H. PYLORI, INTESTINAL METAPLASIA, DYSPLASIA, AND MALIGNANCY.       2. Esophagus, biopsy, CBX distal :       - MILD ACUTE ESOPHAGITIS.      - GMS STAIN WILL BE PERFORMED AND REPORTED IN AN ADDENDUM.      - NEGATIVE FOR INTRAEPITHELIAL EOSINOPHILS,  DYSPLASIA, AND MALIGNANCY.       3. Esophagus, biopsy, CBX proximal :       - PATCHY MILD ACUTE ESOPHAGITIS.      - GMS STAIN WILL BE PERFORMED AND REPORTED IN AN ADDENDUM.      - NEGATIVE FOR INTRAEPITHELIAL EOSINOPH ILS, DYSPLASIA, AND MALIGNANCY.      CORRECTED [id number]: ADDENDUM: GMS stains were performed on parts 2 and 3 and are negative. Stain controls worked appropriately.  The histologic findings in the esophageal biopsies would be compatible with reflux esophagitis in the appropriate clinical setting.  ELECTRONIC SIGNATURE : Rubinas Md, Rexene , Sports Administrator, Electronic  Signature  MICROSCOPIC DESCRIPTION  CASE COMMENTS STAINS USED IN DIAGNOSIS: H&E H&E H&E Stains used in diagnosis 2 GMS, 3 GMS    CLINICAL HISTORY  SPECIMEN(S) OBTAINED 1. Stomach, polyp(s), CBX 2. Esophagus, biopsy, CBX Distal 3. Esophagus, biopsy, CBX Proximal  SPECIMEN COMMENTS: 2. R/O EOE 3. R/O EOE SPECIMEN CLINICAL INFORMATION: 1. GERD, dysphagia, N/V.Schatzki ring/dilated, gastric polyps, hiatal hernia    Gross Description 1. Received in formalin are tan, soft tissue fragments that are submitted in toto.Number:  two  Size:  0.1 and 0.3 cm,  (1 B) 2. Received in formalin are  tan, soft tissue fragments that are submitted in toto.Number:  three,  Size:  0.1 to 0.2 cm,  (1 B) 3. Received in formalin are tan, soft tissue fragments that are submitted in toto.Number:  three,  Size:  0.1 to 0.6 cm,  (1 B)  (SSW:kh 03/27/23)        Report signed out from the following location(s) [facility] [address] [place], [place] [address] CLIA #: [id number]  [facility] [address] [place], [place] [address] CLIA #: [id number]     Assessment/Plan:  Splenic artery aneurysm (HCC) I have independently reviewed her CT scan of the abdomen and pelvis.  This demonstrates an approximately 11 mm splenic artery aneurysm, although to be fair these were 5 mm cuts on a CT scan and not a CT angiogram so the size estimate may be somewhat off.  She is beyond childbearing years and an aneurysm of this size would not pose a much potential risk to her.  This will need to be monitored going forward to ensure stable size and no growth.  Her scan was almost 3 months ago now, so I will plan to see her back in about 6 months with a mesenteric duplex.  Hypertension blood pressure control important in reducing the progression of atherosclerotic disease and aneurysmal growth. On appropriate oral medications.      Selinda Gu 04/14/2023, 10:33 AM   This  note was created with Dragon medical transcription system.  Any errors from dictation are unintentional.

## 2023-04-14 NOTE — Assessment & Plan Note (Signed)
 I have independently reviewed her CT scan of the abdomen and pelvis.  This demonstrates an approximately 11 mm splenic artery aneurysm, although to be fair these were 5 mm cuts on a CT scan and not a CT angiogram so the size estimate may be somewhat off.  She is beyond childbearing years and an aneurysm of this size would not pose a much potential risk to her.  This will need to be monitored going forward to ensure stable size and no growth.  Her scan was almost 3 months ago now, so I will plan to see her back in about 6 months with a mesenteric duplex.

## 2023-04-14 NOTE — Assessment & Plan Note (Signed)
blood pressure control important in reducing the progression of atherosclerotic disease and aneurysmal growth. On appropriate oral medications.  

## 2023-04-15 ENCOUNTER — Ambulatory Visit: Payer: Medicare Other | Admitting: Dermatology

## 2023-05-01 ENCOUNTER — Other Ambulatory Visit: Payer: Self-pay | Admitting: Dermatology

## 2023-05-01 DIAGNOSIS — L308 Other specified dermatitis: Secondary | ICD-10-CM

## 2023-05-13 ENCOUNTER — Ambulatory Visit (INDEPENDENT_AMBULATORY_CARE_PROVIDER_SITE_OTHER): Payer: Medicare Other | Admitting: Dermatology

## 2023-05-13 DIAGNOSIS — R21 Rash and other nonspecific skin eruption: Secondary | ICD-10-CM

## 2023-05-13 DIAGNOSIS — L309 Dermatitis, unspecified: Secondary | ICD-10-CM

## 2023-05-13 NOTE — Progress Notes (Signed)
   Follow-Up Visit   Subjective  Mariah Hogan is a 73 y.o. female who presents for the following: rash on neck that has subsided while using desoximetasone 0.25% cream, pt broke out after using a new shampoo from hair salon. Pt wanting to be seen for dry skin at hands and feet. Pt states skin splitting from being so dry. Pt uses Cerave healing ointment but only helps some. Pt reports hx Sjogren's syndrome.  The patient has spots, moles and lesions to be evaluated, some may be new or changing and the patient may have concern these could be cancer.  The following portions of the chart were reviewed this encounter and updated as appropriate: medications, allergies, medical history  Review of Systems:  No other skin or systemic complaints except as noted in HPI or Assessment and Plan.  Objective  Well appearing patient in no apparent distress; mood and affect are within normal limits.   A focused examination was performed of the following areas: Neck, hands, feet  Relevant exam findings are noted in the Assessment and Plan.    Assessment & Plan   Rash- likely contact dermatitis to new hair shampoo Exam: scalp, neck clear today  Treatment Plan: Resolved with desoximetasone 0.25% cream. Pt has paraben allergy.  She has d/c'd new shampoo, will go back to Vanicream shampoo   HAND DERMATITIS Exam: Severe xerosis with mild erythema at fingers, palms, plantar feet. Scattered small fissures at toes and ball.   Chronic and persistent condition with duration or expected duration over one year. Condition is bothersome/symptomatic for patient. Currently flared.   Hand Dermatitis is a chronic type of eczema that can come and go on the hands and fingers.  While there is no cure, the rash and symptoms can be managed with topical prescription medications, and for more severe cases, with systemic medications.  Recommend mild soap and routine use of moisturizing cream after handwashing.  Minimize  soap/water exposure when possible.     Treatment plan: Recommend Dove hand soap, Cerave hydrating cleanser or Cetaphil gentle cleanser for hands. May continue CeraVe healing ointment after hand washing  Start desoximetasone 0.25% cream BID prn to aa, hands, fingertips, bottom of feet until rash cleared. Apply this first. Recommend wearing cotton gloves at night after applying.  Start AmLactin cream OTC BID to aa, can apply at same time as desoximetasone cream.     Return if symptoms worsen or fail to improve, for w/ Dr. Gwen Pounds.  I, Soundra Pilon, CMA, am acting as scribe for Willeen Niece, MD .   Documentation: I have reviewed the above documentation for accuracy and completeness, and I agree with the above.  Willeen Niece, MD

## 2023-05-13 NOTE — Patient Instructions (Signed)

## 2023-05-25 ENCOUNTER — Ambulatory Visit (INDEPENDENT_AMBULATORY_CARE_PROVIDER_SITE_OTHER): Admitting: Urology

## 2023-05-25 VITALS — BP 101/58 | HR 103 | Ht 66.0 in | Wt 170.0 lb

## 2023-05-25 DIAGNOSIS — N3946 Mixed incontinence: Secondary | ICD-10-CM | POA: Diagnosis not present

## 2023-05-25 DIAGNOSIS — N3945 Continuous leakage: Secondary | ICD-10-CM

## 2023-05-25 LAB — BLADDER SCAN AMB NON-IMAGING: Scan Result: 0

## 2023-05-25 MED ORDER — GEMTESA 75 MG PO TABS: 75.0000 mg | ORAL_TABLET | Freq: Every day | ORAL | 0 refills | Status: DC

## 2023-05-25 NOTE — Progress Notes (Signed)
 I, Maysun Anabel Bene, acting as a scribe for Riki Altes, MD., have documented all relevant documentation on the behalf of Riki Altes, MD, as directed by Riki Altes, MD while in the presence of Riki Altes, MD.  05/25/2023 3:18 PM   Fatima Blank 25-Sep-1950 [id number]  Chief Complaint  Patient presents with   Urinary Incontinence    HPI: Mariah Hogan is a 73 y.o. female self-referred for evaluation of urinary incontinence,   Several years history of mild urinary incontinence, however it has worsened over the last 3-4 months.  States she had a "bladder tack" by Dr. Achilles Dunk in 2002 and 2011. Over the last 3-4 months she complains of loss of urine with coughing, laughing, sneezing. She also has urinary frequency, urgency and urge incontinence.  She saw Dr. Dalbert Garnet in the late January and had a pelvic exam negative for prolapse. She was placed on low dose vaginal estrogen. Denies dysuria or gross hematuria She has nocturia x3.   PMH: Past Medical History:  Diagnosis Date   Anemia    Arthritis    Asthma    H/O YEARS AGO-NOW HAS COUGHING SPELLS WHICH IS WHY SHE HAS HER ALBUTEROL INHALER   Basal cell carcinoma    R nose (MOHS)   Basal cell carcinoma 01/29/2016   Right forehead above med brow   Basal cell carcinoma 10/12/2019   L nasal tip - MOHS 05/15/2020   BRCA gene mutation negative 08/12/2017   Variant of unknown significance at [facility] only abnormality.   Breast cancer (HCC) 08/04/2017   1.8 cm ER 90%, PR 20%, HER-2/neu negative invasive mammary carcinoma of the left upper outer quadrant.  MammaPrint: High risk.   Cancer (HCC)    [phone] basal cell carcinoma   Complication of anesthesia    HARD TIME WAKING UP   Dysplastic nevus 02/21/2010   Right mid pretibial, mild   Dysrhythmia    TACHYCARDIA   GERD (gastroesophageal reflux disease)    History of hiatal hernia    SMALL   Hyperlipidemia    Hypertension    PT STATES IN 01-18-18 PREOP PHONE  INTERVIEW THAT HER BP HAS BEEN ELEVATED SINCE STARTING ON VALSARTAN- PCP SWITCHED PT BACK TO MAXIDE ON 01-18-18 AND PT STATES THAT SHE HAS LOST 6-10 POUNDS OF FLUID SINCE RESTARTING MAXIDE.     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   BREAST BIOPSY Right 08/2017   MRI biopsy, FIBROCYSTIC CHANGE AND USUAL DUCTAL HYPERPLASIA,, clip placed   BREAST CYST EXCISION  x3   BREAST EXCISIONAL BIOPSY Bilateral    BREAST RECONSTRUCTION WITH PLACEMENT OF TISSUE EXPANDER AND FLEX HD (ACELLULAR HYDRATED DERMIS) Left 01/27/2018   Procedure: BREAST RECONSTRUCTION WITH PLACEMENT OF TISSUE EXPANDER AND FLEX HD (ACELLULAR HYDRATED DERMIS);  Surgeon: Peggye Form, DO;  Location: [facility];  Service: Plastics;  Laterality: Left;   BREAST SURGERY Left 02/13/14   Fibrocystic changes, pseudo-angiomatous stromal hyperplasia. No atypia  or malignancy.   CARPAL TUNNEL RELEASE Bilateral x2   CHOLECYSTECTOMY  2008   COLONOSCOPY WITH PROPOFOL N/A 01/12/2015   Procedure: COLONOSCOPY WITH PROPOFOL;  Surgeon: Scot Jun, MD;  Location: [facility] END[facility]COPY;  Service: Endoscopy;  Laterality: N/A;   COLONOSCOPY WITH PROPOFOL N/A 02/06/2020   Procedure: COLONOSCOPY WITH PROPOFOL;  Surgeon: Regis Bill, MD;  Location: [facility];  Service: Endoscopy;  Laterality: N/A;   ESOPHAGEAL DILATION  03/27/2023   Procedure: ESOPHAGEAL DILATION;  Surgeon: Regis Bill, MD;  Location: [facility];  Service: Endoscopy;;    ESOPHAGOGASTRODUODENOSCOPY (EGD) WITH PROPOFOL N/A 01/12/2015   Procedure: ESOPHAGOGASTRODUODENOSCOPY (EGD) WITH PROPOFOL;  Surgeon: Scot Jun, MD;  Location: [facility] ENDOSCOPY;  Service: Endoscopy;  Laterality: N/A;   ESOPHAGOGASTRODUODENOSCOPY (EGD) WITH PROPOFOL N/A 02/06/2020   Procedure: ESOPHAGOGASTRODUODENOSCOPY (EGD) WITH PROPOFOL;  Surgeon: Regis Bill, MD;  Location: [facility];  Service: Endoscopy;  Laterality: N/A;   ESOPHAGOGASTRODUODENOSCOPY (EGD) WITH PROPOFOL N/A 03/27/2023   Procedure: ESOPHAGOGASTRODUODENOSCOPY (EGD) WITH PROPOFOL;  Surgeon: Regis Bill, MD;  Location: [facility];  Service: Endoscopy;  Laterality: N/A;   LIPOSUCTION WITH LIPOFILLING Left 12/23/2018   Procedure: LIPOSUCTION WITH LIPOFILLING FROM ABDOMEN TO LEFT BREAST;  Surgeon: Peggye Form, DO;  Location: [facility];  Service: Plastics;  Laterality: Left;  90 min, please   MASTECTOMY Left 2019   [facility]   MASTECTOMY W/ SENTINEL NODE BIOPSY Left 01/27/2018   ypT1c ypN0; ER 90%, PR 20%, HER-2/neu not overexpressed.  MASTECTOMY WITH SENTINEL LYMPH NODE BIOPSY;  Surgeon: Earline Mayotte, MD;  Location: [facility];  Service: General;  Laterality: Left;   MINOR BREAST BIOPSY Right 09/02/2017   Radiology perform procedure, fibrocystic changes right retroareolar area.   NASAL RECONSTRUCTION  1983   OOPHORECTOMY     POLYPECTOMY  03/27/2023   Procedure: POLYPECTOMY;  Surgeon: Regis Bill, MD;  Location: [facility];  Service: Endoscopy;;   PORT-A-CATH REMOVAL     PORTACATH PLACEMENT N/A 10/21/2017   Procedure: INSERTION PORT-A-CATH;  Surgeon: Earline Mayotte, MD;  Location: [facility];  Service: General;  Laterality: N/A;   REMOVAL OF TISSUE EXPANDER AND PLACEMENT OF IMPLANT Left 05/13/2018   Procedure: REMOVAL OF TISSUE EXPANDER AND PLACEMENT OF IMPLANT;  Surgeon: Peggye Form, DO;  Location: [facility];  Service: Plastics;  Laterality: Left;  total surgery  time should be 3 hours, per provider   REVERSE SHOULDER ARTHROPLASTY Right 11/27/2020   Procedure: REVERSE SHOULDER ARTHROPLASTY;  Surgeon: Christena Flake, MD;  Location: [facility];  Service: Orthopedics;  Laterality: Right;   SAVORY DILATION N/A 01/12/2015   Procedure: SAVORY DILATION;  Surgeon: Scot Jun, MD;  Location[facility] END[facility]COPY;  Service: Endoscopy;  Laterality: N/A;   TONSILLECTOMY AND ADENOIDECTOMY  1956    Home Medications:  Allergies as of 05/25/2023       Reactions   Cytoxan [cyclophosphamide] Shortness Of Breath   BACK PAIN, BACK SPASM   Penicillin G Shortness Of Breath, Swelling, Rash   Tolerated 1st generation cephalosporin (CEFAZOLIN) on 10/21/17 and 01/27/18 without ADRs.  PCN reaction causing immediate rash, facial/tongue/throat swelling, SOB or lightheadedness with hypotension: Yes PCN reaction causing severe rash involving mucus membranes or skin necrosis: No PCN reaction that required hospitalization: No PCN reaction occurring within the last 10 years: No If all of the above answers are "NO", then may proceed with Cephalosporin use.   Taxotere [docetaxel] Shortness Of Breath, Palpitations, Other (See Comments)   Back pain, Back  Spasms   Other Other (See Comments)   Dodecyl gallate: Positive patch test  Elta MD UV Clear SPF 46: Positive patch test  fragrance   Parabens Other (See Comments)   Positive patch test    Shellac Other (See Comments)   Positive patch test    Ciprofloxacin Hives, Itching, Rash   Fluocinonide Rash   Tape Dermatitis   Paper tape is ok, tegaderm OK   Triamcinolone Rash        Medication List        Accurate as of May 25, 2023  3:18 PM. If you have any questions, ask your nurse or doctor.          STOP taking these medications    albuterol 108 (90 Base) MCG/ACT inhaler Commonly known as: VENTOLIN HFA Stopped by: Riki Altes       TAKE these medications    ascorbic acid 500 MG tablet Commonly known as:  VITAMIN C Take by mouth.   atenolol 25 MG tablet Commonly known as: TENORMIN Take 0.5 tablets (12.5 mg total) by mouth 2 (two) times daily.   calcium carbonate 1500 (600 Ca) MG Tabs tablet Commonly known as: OSCAL Take 600 mg of elemental calcium by mouth daily with breakfast.   citalopram 20 MG tablet Commonly known as: CELEXA Take 20 mg by mouth at bedtime.   cyanocobalamin 1000 MCG tablet Take by mouth.   desoximetasone 0.25 % cream Commonly known as: TOPICORT Apply 1 application topically 2 (two) times daily.   gabapentin 100 MG capsule Commonly known as: NEURONTIN Take 100 mg by mouth 2 (two) times daily.   Gemtesa 75 MG Tabs Generic drug: Vibegron Take 1 tablet (75 mg total) by mouth daily. Started by: Riki Altes   hydroxychloroquine 200 MG tablet Commonly known as: PLAQUENIL Take 200 mg by mouth daily.   montelukast 10 MG tablet Commonly known as: SINGULAIR Take 10 mg by mouth at bedtime.   Multi-Vitamin tablet Take by mouth.   pantoprazole 40 MG tablet Commonly known as: PROTONIX Take 40 mg by mouth 2 (two) times daily.   Premarin vaginal cream Generic drug: conjugated estrogens Place 1 Applicatorful vaginally once a week.   triamterene-hydrochlorothiazide 37.5-25 MG tablet Commonly known as: MAXZIDE-25 Take 1 tablet by mouth daily.   valACYclovir 1000 MG tablet Commonly known as: VALTREX Take 1 tablet (1,000 mg total) by mouth as directed.   Vitamin D 50 MCG (2000 UT) Caps Take 2,000 Units by mouth daily.        Allergies:  Allergies  Allergen Reactions   Cytoxan [Cyclophosphamide] Shortness Of Breath    BACK PAIN, BACK SPASM   Penicillin G Shortness Of Breath, Swelling and Rash    Tolerated 1st generation cephalosporin (CEFAZOLIN) on 10/21/17 and 01/27/18 without ADRs.  PCN reaction causing immediate rash, facial/tongue/throat swelling, SOB or lightheadedness with hypotension: Yes PCN reaction causing severe rash involving mucus  membranes or skin necrosis: No PCN reaction that required hospitalization: No PCN reaction occurring within the last 10 years: No If all of the above answers are "NO", then may proceed with Cephalosporin use.   Taxotere [Docetaxel] Shortness Of Breath, Palpitations and Other (See Comments)    Back pain, Back Spasms   Other Other (See Comments)    Dodecyl gallate: Positive patch test  Elta MD UV Clear SPF 46: Positive patch test  fragrance    Parabens Other (See Comments)    Positive patch test    Shellac Other (See Comments)  Positive patch test    Ciprofloxacin Hives, Itching and Rash   Fluocinonide Rash   Tape Dermatitis    Paper tape is ok, tegaderm OK    Triamcinolone Rash    Family History: Family History  Problem Relation Age of Onset   Breast cancer Sister 4       currently 17   Breast cancer Paternal Grandmother 52       bilateral; deceased 69s    Breast cancer Cousin 64       daughter of an unaffected maternal aunt   Breast cancer Cousin 27       kidney cancer also; daughter of an unaffected maternal aunt   Other Mother        TAH/BSO prior to menopause   Prostate cancer Paternal Grandfather        age at dx unknown   Breast cancer Maternal Aunt        age at dx unknown   Breast cancer Cousin 43       daughter of an unaffected maternal aunt    Social History:  reports that she has never smoked. She has never used smokeless tobacco. She reports that she does not drink alcohol and does not use drugs.   Physical Exam: BP (!) 101/58   Pulse (!) 103   Ht 5\' 6"  (1.676 m)   Wt 170 lb (77.1 kg)   BMI 27.44 kg/m   Constitutional:  Alert and oriented, No acute distress. HEENT: [employer] AT, moist mucus membranes.  Trachea midline, no masses. Cardiovascular: No clubbing, cyanosis, or edema. Respiratory: Normal respiratory effort, no increased work of breathing. GI: Abdomen is soft, nontender, nondistended, no abdominal masses Skin: No rashes, bruises or suspicious  lesions. Neurologic: Grossly intact, no focal deficits, moving all 4 extremities. Psychiatric: Normal mood and affect.  Assessment & Plan:    1. Mixed urinary incontinence Both stress and urge related symptoms.  We discussed the option of a beta-3 agonist for her urge related symptoms and reassess the impact of her incontinence with this medication. We also discussed referral to physical therapy for their urinary incontinence program which will help both stress and urge related symptoms.  She was interested in pursuing both.  Given Gemtesa samples 75 mg daily x4 weeks and we'll schedule a PA follow-up in 1 month for symptom reassessment.  A referral to physical therapy for their urinary incontinence program was also placed. PVR today 0 mL.  [facility] [address] [place], [place] [address] [phone]

## 2023-05-27 ENCOUNTER — Encounter: Payer: Self-pay | Admitting: Urology

## 2023-06-08 ENCOUNTER — Ambulatory Visit: Admitting: Urology

## 2023-06-12 ENCOUNTER — Ambulatory Visit: Admitting: Urology

## 2023-06-18 ENCOUNTER — Ambulatory Visit: Payer: BLUE CROSS/BLUE SHIELD | Admitting: Dermatology

## 2023-06-22 ENCOUNTER — Encounter: Payer: Self-pay | Admitting: Dermatology

## 2023-06-22 ENCOUNTER — Ambulatory Visit (INDEPENDENT_AMBULATORY_CARE_PROVIDER_SITE_OTHER): Admitting: Dermatology

## 2023-06-22 DIAGNOSIS — L308 Other specified dermatitis: Secondary | ICD-10-CM | POA: Diagnosis not present

## 2023-06-22 DIAGNOSIS — Z1283 Encounter for screening for malignant neoplasm of skin: Secondary | ICD-10-CM | POA: Diagnosis not present

## 2023-06-22 DIAGNOSIS — L820 Inflamed seborrheic keratosis: Secondary | ICD-10-CM | POA: Diagnosis not present

## 2023-06-22 DIAGNOSIS — Z85828 Personal history of other malignant neoplasm of skin: Secondary | ICD-10-CM

## 2023-06-22 DIAGNOSIS — Z853 Personal history of malignant neoplasm of breast: Secondary | ICD-10-CM

## 2023-06-22 DIAGNOSIS — L578 Other skin changes due to chronic exposure to nonionizing radiation: Secondary | ICD-10-CM | POA: Diagnosis not present

## 2023-06-22 DIAGNOSIS — W908XXA Exposure to other nonionizing radiation, initial encounter: Secondary | ICD-10-CM

## 2023-06-22 DIAGNOSIS — L821 Other seborrheic keratosis: Secondary | ICD-10-CM

## 2023-06-22 DIAGNOSIS — B009 Herpesviral infection, unspecified: Secondary | ICD-10-CM

## 2023-06-22 DIAGNOSIS — L2089 Other atopic dermatitis: Secondary | ICD-10-CM

## 2023-06-22 DIAGNOSIS — Z7189 Other specified counseling: Secondary | ICD-10-CM

## 2023-06-22 DIAGNOSIS — Z79899 Other long term (current) drug therapy: Secondary | ICD-10-CM

## 2023-06-22 DIAGNOSIS — D1801 Hemangioma of skin and subcutaneous tissue: Secondary | ICD-10-CM

## 2023-06-22 DIAGNOSIS — L814 Other melanin hyperpigmentation: Secondary | ICD-10-CM

## 2023-06-22 DIAGNOSIS — Z86018 Personal history of other benign neoplasm: Secondary | ICD-10-CM

## 2023-06-22 MED ORDER — DESOXIMETASONE 0.25 % EX CREA: TOPICAL_CREAM | CUTANEOUS | 5 refills | Status: AC

## 2023-06-22 MED ORDER — VALACYCLOVIR HCL 1 G PO TABS: ORAL_TABLET | ORAL | 3 refills | Status: AC

## 2023-06-22 NOTE — Patient Instructions (Addendum)
Seborrheic Keratosis  What causes seborrheic keratoses? Seborrheic keratoses are harmless, common skin growths that first appear during adult life.  As time goes by, more growths appear.  Some people may develop a large number of them.  Seborrheic keratoses appear on both covered and uncovered body parts.  They are not caused by sunlight.  The tendency to develop seborrheic keratoses can be inherited.  They vary in color from skin-colored to gray, brown, or even black.  They can be either smooth or have a rough, warty surface.   Seborrheic keratoses are superficial and look as if they were stuck on the skin.  Under the microscope this type of keratosis looks like layers upon layers of skin.  That is why at times the top layer may seem to fall off, but the rest of the growth remains and re-grows.    Treatment Seborrheic keratoses do not need to be treated, but can easily be removed in the office.  Seborrheic keratoses often cause symptoms when they rub on clothing or jewelry.  Lesions can be in the way of shaving.  If they become inflamed, they can cause itching, soreness, or burning.  Removal of a seborrheic keratosis can be accomplished by freezing, burning, or surgery. If any spot bleeds, scabs, or grows rapidly, please return to have it checked, as these can be an indication of a skin cancer.   Cryotherapy Aftercare  Wash gently with soap and water everyday.   Apply Vaseline and Band-Aid daily until healed.      Melanoma ABCDEs  Melanoma is the most dangerous type of skin cancer, and is the leading cause of death from skin disease.  You are more likely to develop melanoma if you: Have light-colored skin, light-colored eyes, or red or blond hair Spend a lot of time in the sun Tan regularly, either outdoors or in a tanning bed Have had blistering sunburns, especially during childhood Have a close family member who has had a melanoma Have atypical moles or large birthmarks  Early  detection of melanoma is key since treatment is typically straightforward and cure rates are extremely high if we catch it early.   The first sign of melanoma is often a change in a mole or a new dark spot.  The ABCDE system is a way of remembering the signs of melanoma.  A for asymmetry:  The two halves do not match. B for border:  The edges of the growth are irregular. C for color:  A mixture of colors are present instead of an even brown color. D for diameter:  Melanomas are usually (but not always) greater than 6mm - the size of a pencil eraser. E for evolution:  The spot keeps changing in size, shape, and color.  Please check your skin once per month between visits. You can use a small mirror in front and a large mirror behind you to keep an eye on the back side or your body.   If you see any new or changing lesions before your next follow-up, please call to schedule a visit.  Please continue daily skin protection including broad spectrum sunscreen SPF 30+ to sun-exposed areas, reapplying every 2 hours as needed when you're outdoors.   Staying in the shade or wearing long sleeves, sun glasses (UVA+UVB protection) and wide brim hats (4-inch brim around the entire circumference of the hat) are also recommended for sun protection.    Due to recent changes in healthcare laws, you may see results of your  pathology and/or laboratory studies on MyChart before the doctors have had a chance to review them. We understand that in some cases there may be results that are confusing or concerning to you. Please understand that not all results are received at the same time and often the doctors may need to interpret multiple results in order to provide you with the best plan of care or course of treatment. Therefore, we ask that you please give [place] 2 business days to thoroughly review all your results before contacting the office for clarification. Should we see a critical lab result, you will be contacted  sooner.   If You Need Anything After Your Visit  If you have any questions or concerns for your doctor, please call our main line at [phone] and press option 4 to reach your doctor's medical assistant. If no one answers, please leave a voicemail as directed and we will return your call as soon as possible. Messages left after 4 pm will be answered the following business day.   You may also send [place] a message via MyChart. We typically respond to MyChart messages within 1-2 business days.  For prescription refills, please ask your pharmacy to contact our office. Our fax number is [phone].  If you have an urgent issue when the clinic is closed that cannot wait until the next business day, you can page your doctor at the number below.    Please note that while we do our best to be available for urgent issues outside of office hours, we are not available 24/7.   If you have an urgent issue and are unable to reach [place], you may choose to seek medical care at your doctor's office, retail clinic, urgent care center, or emergency room.  If you have a medical emergency, please immediately call 911 or go to the emergency department.  Pager Numbers  - Dr. Gwen Pounds: [phone]  - Dr. Roseanne Reno: [phone]  - Dr. Katrinka Blazing: [phone]   In the event of inclement weather, please call our main line at [phone] for an update on the status of any delays or closures.  Dermatology Medication Tips: Please keep the boxes that topical medications come in in order to help keep track of the instructions about where and how to use these. Pharmacies typically print the medication instructions only on the boxes and not directly on the medication tubes.   If your medication is too expensive, please contact our office at [phone] option 4 or send [place] a message through MyChart.   We are unable to tell what your co-pay for medications will be in advance as this is different depending on your  insurance coverage. However, we may be able to find a substitute medication at lower cost or fill out paperwork to get insurance to cover a needed medication.   If a prior authorization is required to get your medication covered by your insurance company, please allow [place] 1-2 business days to complete this process.  Drug prices often vary depending on where the prescription is filled and some pharmacies may offer cheaper prices.  The website www.goodrx.com contains coupons for medications through different pharmacies. The prices here do not account for what the cost may be with help from insurance (it may be cheaper with your insurance), but the website can give you the price if you did not use any insurance.  - You can print the associated coupon and take it with your prescription to the pharmacy.  - You may also stop  by our office during regular business hours and pick up a GoodRx coupon card.  - If you need your prescription sent electronically to a different pharmacy, notify our office through [facility] or by phone at [phone] option 4.     Si Usted Necesita Algo Despus de Su Visita  Tambin puede enviarnos un mensaje a travs de Clinical cytogeneticist. Por lo general respondemos a los mensajes de MyChart en el transcurso de 1 a 2 das hbiles.  Para renovar recetas, por favor pida a su farmacia que se ponga en contacto con nuestra oficina. Annie Sable de fax es [place] [phone].  Si tiene un asunto urgente cuando la clnica est cerrada y que no puede esperar hasta el siguiente da hbil, puede llamar/localizar a su doctor(a) al nmero que aparece a continuacin.   Por favor, tenga en cuenta que aunque hacemos todo lo posible para estar disponibles para asuntos urgentes fuera del horario de [place], no estamos disponibles las 24 horas del da, los 7 [address]  [address] de la [place].   Si tiene un problema urgente y no puede comunicarse con nosotros, puede optar por buscar atencin mdica  en el  consultorio de su doctor(a), en una clnica privada, en un centro de atencin urgente o en una sala de emergencias.  Si tiene Engineer, drilling, por favor llame inmediatamente al 911 o vaya a la sala de emergencias.  Nmeros de bper  - Dr. Gwen Pounds: [phone]  - Dra. Roseanne Reno: [phone]  - Dr. Katrinka Blazing: [phone]   En caso de inclemencias del tiempo, por favor llame a Lacy Duverney principal al [phone] para una actualizacin sobre el [place] de cualquier retraso o cierre.  Consejos para la medicacin en dermatologa: Por favor, guarde las cajas en las que vienen los medicamentos de uso tpico para ayudarle a seguir las instrucciones sobre dnde y cmo usarlos. Las farmacias generalmente imprimen las instrucciones del medicamento slo en las cajas y no directamente en los tubos del [place].   Si su medicamento es muy caro, por favor, pngase en contacto con Rolm Gala llamando al [phone] y presione la opcin 4 o envenos un mensaje a travs de Clinical cytogeneticist.   No podemos decirle cul ser su copago por los medicamentos por adelantado ya que esto es diferente dependiendo de la cobertura de su seguro. Sin embargo, es posible que podamos encontrar un medicamento sustituto a Audiological scientist un formulario para que el seguro cubra el medicamento que se considera necesario.   Si se requiere una autorizacin previa para que su compaa de seguros [place] su medicamento, por favor permtanos de 1 a 2 das hbiles para completar [address]  Los precios de los medicamentos varan con frecuencia dependiendo del Environmental consultant de dnde se surte la receta y alguna farmacias pueden ofrecer precios ms baratos.  El sitio web www.goodrx.com tiene cupones para medicamentos de Health and safety inspector. Los precios aqu no tienen en cuenta lo que podra costar con la ayuda del seguro (puede ser ms barato con su seguro), pero el sitio web puede darle el precio si no utiliz Tourist information centre manager.  -  Puede imprimir el cupn correspondiente y llevarlo con su receta a la farmacia.  - Tambin puede pasar por nuestra oficina durante el horario de atencin regular y Education officer, museum una tarjeta de cupones de GoodRx.  - Si necesita que su receta se enve electrnicamente a Psychiatrist, informe a nuestra oficina a travs de MyChart de [place] o por telfono llamando al [phone] y presione la opcin  4.

## 2023-06-22 NOTE — Progress Notes (Signed)
 Follow-Up Visit   Subjective  Mariah Hogan is a 73 y.o. female who presents for the following: Skin Cancer Screening and Full Body Skin Exam Spot at right neck that noticed looks darker  Spots under bra and under arms that bother patient   The patient presents for Total-Body Skin Exam (TBSE) for skin cancer screening and mole check. The patient has spots, moles and lesions to be evaluated, some may be new or changing and the patient may have concern these could be cancer.  The following portions of the chart were reviewed this encounter and updated as appropriate: medications, allergies, medical history  Review of Systems:  No other skin or systemic complaints except as noted in HPI or Assessment and Plan.  Objective  Well appearing patient in no apparent distress; mood and affect are within normal limits.  A full examination was performed including scalp, head, eyes, ears, nose, lips, neck, chest, axillae, abdomen, back, buttocks, bilateral upper extremities, bilateral lower extremities, hands, feet, fingers, toes, fingernails, and toenails. All findings within normal limits unless otherwise noted below.   Relevant physical exam findings are noted in the Assessment and Plan.  right medial clavicle x 1, right infraxillary x 1, left medial knee x 1, back x 18, right inframammary x 2 (23) Erythematous stuck-on, waxy papule or plaque Left Preauricular Area ( 1 sk treated with cryotherapy free today to see how she responds with treatment) Erythematous stuck - on waxy papule See photo    Assessment & Plan   SKIN CANCER SCREENING PERFORMED TODAY.  ACTINIC DAMAGE - Chronic condition, secondary to cumulative UV/sun exposure - diffuse scaly erythematous macules with underlying dyspigmentation - Recommend daily broad spectrum sunscreen SPF 30+ to sun-exposed areas, reapply every 2 hours as needed.  - Staying in the shade or wearing long sleeves, sun glasses (UVA+UVB protection) and  wide brim hats (4-inch brim around the entire circumference of the hat) are also recommended for sun protection.  - Call for new or changing lesions.  LENTIGINES, SEBORRHEIC KERATOSES, HEMANGIOMAS - Benign normal skin lesions - Benign-appearing - Call for any changes  MELANOCYTIC NEVI - Tan-brown and/or pink-flesh-colored symmetric macules and papules - Benign appearing on exam today - Observation - Call clinic for new or changing moles - Recommend daily use of broad spectrum spf 30+ sunscreen to sun-exposed areas.   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  Call if any new or changing lesions are noted between office visits  HISTORY OF BASAL CELL CARCINOMA OF THE SKIN 10/12/2019 - left nasal tip - Mohs 05/15/2020 01/29/2016 - right forehead above medial brow  - No evidence of recurrence today - Recommend regular full body skin exams - Recommend daily broad spectrum sunscreen SPF 30+ to sun-exposed areas, reapply every 2 hours as needed.  - Call if any new or changing lesions are noted between office visits  History of breast cancer at left breast 08/04/2017  No lymphadenopathy.  Skin clear today.  INFLAMED SEBORRHEIC KERATOSIS (24) Left Preauricular Area ( 1 sk treated with cryotherapy free today to see how she responds with treatment), right medial clavicle x 1, right infraxillary x 1, left medial knee x 1, back x 18, right inframammary x 2 (23) Symptomatic, irritating, patient would like treated.  Patient treated with complimentary cryotherapy (no charge) at left  preauricular SK area see photos to see how well she responds with treatment, may have additional cosmetic areas treated in future.  Destruction of lesion - Left Preauricular Area ( 1 sk treated with cryotherapy free today to  see how she responds with treatment), right medial clavicle x 1, right infraxillary x 1, left medial knee x 1, back x 18, right inframammary x 2 (23) Complexity: simple   Destruction method: cryotherapy   Informed consent: discussed and consent obtained   Timeout:  patient name, date of birth, surgical site, and procedure verified Lesion destroyed using liquid nitrogen: Yes   Region frozen until ice ball extended beyond lesion: Yes   Outcome: patient tolerated procedure well with no complications   Post-procedure details: wound care instructions given   HERPES SIMPLEX   Related Medications valACYclovir (VALTREX) 1000 MG tablet Take 2 tablets at first sign of symptoms, repeat in 12 hours for 1 dose then decrease to 1 tablet once daily for maintenance. OTHER ECZEMA   Related Medications desoximetasone (TOPICORT) 0.25 % cream Apply topically to affected areas use up to 5 days a week. Avoid applying to face, groin, and axilla. Use as directed. Long-term use can cause thinning of the skin. Return in about 1 year (around 06/21/2024) for TBSE.  IRandee Busing, CMA, am acting as scribe for Celine Collard, MD.   Documentation: I have reviewed the above documentation for accuracy and completeness, and I agree with the above.  Celine Collard, MD

## 2023-06-23 ENCOUNTER — Other Ambulatory Visit: Payer: Self-pay | Admitting: *Deleted

## 2023-06-23 ENCOUNTER — Telehealth: Payer: Self-pay | Admitting: Urology

## 2023-06-23 MED ORDER — GEMTESA 75 MG PO TABS: 75.0000 mg | ORAL_TABLET | Freq: Every day | ORAL | 3 refills | Status: DC

## 2023-06-23 NOTE — Telephone Encounter (Signed)
 Patient dropped in office this morning regarding Gemtesa prescription. She said it would cost her over $300.00, and it is too expensive for her with her only income being [employer]. She spoke with a [place] representative that told her to have our office call them at [phone] to give them prescription information with a one year refill; 90 day supply at a time. She was told that she would qualify for a reduced rate due to her income. She was told that after our office contacts them, that they will reach out to her for her income information.

## 2023-06-23 NOTE — Telephone Encounter (Signed)
 SEND MEDICATION OVER TO [facility] [place]  .

## 2023-07-09 ENCOUNTER — Ambulatory Visit: Admitting: Urology

## 2023-07-10 ENCOUNTER — Ambulatory Visit: Payer: Medicare Other | Admitting: Urology

## 2023-07-14 ENCOUNTER — Ambulatory Visit: Admitting: Urology

## 2023-07-21 ENCOUNTER — Ambulatory Visit (INDEPENDENT_AMBULATORY_CARE_PROVIDER_SITE_OTHER): Admitting: Urology

## 2023-07-21 ENCOUNTER — Encounter: Payer: Self-pay | Admitting: Urology

## 2023-07-21 VITALS — BP 106/68 | HR 81 | Ht 66.5 in | Wt 167.0 lb

## 2023-07-21 DIAGNOSIS — N3946 Mixed incontinence: Secondary | ICD-10-CM

## 2023-07-21 LAB — BLADDER SCAN AMB NON-IMAGING

## 2023-07-21 NOTE — Patient Instructions (Addendum)
 Pelvic floor PT [phone]

## 2023-07-21 NOTE — Progress Notes (Signed)
 07/21/2023 3:06 PM   Mariah Hogan [birth year] [id number]  Referring provider: Delmus Ferri, MD [address] [facility] [place],  [place] [address]  Urological history: 1.  Incontinence - Bladder Tack (2002 and 2011) - Vaginal estrogen cream  Chief Complaint  Patient presents with   Follow-up    HPI: Mariah Hogan is a 73 y.o. woman who presents today for one month follow up after a trial of Gemtesa .    Previous records reviewed.   She is having 8 or more daytime voids, nocturia 3 or more, with a mild to strong urge to urinate.  She has incontinence with stress and urge.  She is leaking 1-2 times a day.  She wears 2-3 panty liners daily.  She does not limit fluid intake.  She engages in toilet mapping.  Patient denies any modifying or aggravating factors.  Patient denies any recent UTI's, gross hematuria, dysuria or suprapubic/flank pain.  Patient denies any fevers, chills, nausea or vomiting.    PVR 0 mL   She found the Gemtesa  helpful in controlling her urgency and frequency.  She was able to get the Gemtesa  free for year as the co-pay was going to be over $200 a month with her insurance.  She is experiencing more of a dry her mouth in the evening on the Gemtesa .  PMH: Past Medical History:  Diagnosis Date   Anemia    Arthritis    Asthma    H/O YEARS AGO-NOW HAS COUGHING SPELLS WHICH IS WHY SHE HAS HER ALBUTEROL  INHALER   Basal cell carcinoma    R nose ([facility]HS)   Basal cell carcinoma 01/29/2016   Right forehead above med brow   Basal cell carcinoma 10/12/2019   L nasal tip - MOHS 05/15/2020   BRCA gene mutation negative 08/12/2017   Variant of unknown significance at [facility] only abnormality.   Breast cancer (HCC) 08/04/2017   1.8 cm ER 90%, PR 20%, HER-2/neu negative invasive mammary carcinoma of the left upper outer quadrant.  MammaPrint: High risk.   Cancer (HCC)    [id number] basal cell carcinoma   Complication of anesthesia     HARD TIME WAKING UP   Dysplastic nevus 02/21/2010   Right mid pretibial, mild   Dysrhythmia    TACHYCARDIA   GERD (gastroesophageal reflux disease)    History of hiatal hernia    SMALL   Hyperlipidemia    Hypertension    PT STATES IN 01-18-18 PREOP PHONE INTERVIEW THAT HER BP HAS BEEN ELEVATED SINCE STARTING ON VALSARTAN- PCP SWITCHED PT BACK TO MAXIDE ON 01-18-18 AND PT STATES THAT SHE HAS LOST 6-10 POUNDS OF FLUID SINCE RESTARTING MAXIDE.     Personal history of chemotherapy    PONV (postoperative nausea and vomiting)    Sjogren's syndrome (HCC) 07/2020    Surgical History: Past Surgical History:  Procedure Laterality Date   ABDOMINAL HYSTERECTOMY  1998   BIOPSY  03/27/2023   Procedure: BIOPSY;  Surgeon: Shane Darling, MD;  Location: [facility] ENDOSCOPY;  Service: Endoscopy;;   bladder tack  [phone]   BREAST BIOPSY Left 1990   BREAST BIOPSY Left 2018   core bx- neg   BREAST BIOPSY Left 08/04/2017   left UOQ 2 oclock INVASIVE DUCTAL CARCINOMA.   BREAST BIOPSY Right 08/2017   MRI biopsy, FIBROCYSTIC CHANGE AND USUAL DUCTAL HYPERPLASIA,, clip placed   BREAST CYST EXCISION  x3   BREAST EXCISIONAL BIOPSY Bilateral    BREAST RECONSTRUCTION WITH  PLACEMENT OF TISSUE EXPANDER AND FLEX HD (ACELLULAR HYDRATED DERMIS) Left 01/27/2018   Procedure: BREAST RECONSTRUCTION WITH PLACEMENT OF TISSUE EXPANDER AND FLEX HD (ACELLULAR HYDRATED DERMIS);  Surgeon: Thornell Flirt, DO;  Location: [facility] ORS;  Service: Plastics;  Laterality: Left;   BREAST SURGERY Left 02/13/14   Fibrocystic changes, pseudo-angiomatous stromal hyperplasia. No atypia or malignancy.   CARPAL TUNNEL RELEASE Bilateral x2   CHOLECYSTECTOMY  2008   COLONOSCOPY WITH PROPOFOL  N/A 01/12/2015   Procedure: COLONOSCOPY WITH PROPOFOL ;  Surgeon: Cassie Click, MD;  Location: [facility] ENDOSCOPY;  Service: Endoscopy;  Laterality: N/A;   COLONOSCOPY WITH PROPOFOL  N/A 02/06/2020   Procedure: COLONOSCOPY WITH PROPOFOL ;  Surgeon:  Shane Darling, MD;  Location: [facility];  Service: Endoscopy;  Laterality: N/A;   ESOPHAGEAL DILATION  03/27/2023   Procedure: ESOPHAGEAL DILATION;  Surgeon: Shane Darling, MD;  Location: [facility];  Service: Endoscopy;;   ESOPHAGOGASTRODUODENOSCOPY (EGD) WITH PROPOFOL  N/A 01/12/2015   Procedure: ESOPHAGOGASTRODUODENOSCOPY (EGD) WITH PROPOFOL ;  Surgeon: Cassie Click, MD;  Location: [facility] ENDOSCOPY;  Service: Endoscopy;  Laterality: N/A;   ESOPHAGOGASTRODUODENOSCOPY (EGD) WITH PROPOFOL  N/A 02/06/2020   Procedure: ESOPHAGOGASTRODUODENOSCOPY (EGD) WITH PROPOFOL ;  Surgeon: Shane Darling, MD;  Location: [facility];  Service: Endoscopy;  Laterality: N/A;   ESOPHAGOGASTRODUODENOSCOPY (EGD) WITH PROPOFOL  N/A 03/27/2023   Procedure: ESOPHAGOGASTRODUODENOSCOPY (EGD) WITH PROPOFOL ;  Surgeon: Shane Darling, MD;  Location: [facility];  Service: Endoscopy;  Laterality: N/A;   LIPOSUCTION WITH LIPOFILLING Left 12/23/2018   Procedure: LIPOSUCTION WITH LIPOFILLING FROM ABDOMEN TO LEFT BREAST;  Surgeon: Thornell Flirt, DO;  Location: [facility];  Service: Plastics;  Laterality: Left;  90 min, please   MASTECTOMY Left 2019   [facility]   MASTECTOMY W/ SENTINEL NODE BIOPSY Left 01/27/2018   ypT1c ypN0; ER 90%, PR 20%, HER-2/neu not overexpressed.  MASTECTOMY WITH SENTINEL LYMPH NODE BIOPSY;  Surgeon: Marshall Skeeter, MD;  Location: [facility] ORS;  Service: General;  Laterality: Left;   MINOR BREAST BIOPSY Right 09/02/2017   Radiology perform procedure, fibrocystic changes right retroareolar area.   NASAL RECONSTRUCTION  1983   OOPHORECTOMY     POLYPECTOMY  03/27/2023   Procedure: POLYPECTOMY;  Surgeon: Shane Darling, MD;  Location: [facility] END[facility];  Service: Endoscopy;;   PORT-A-CATH REMOVAL     PORTACATH PLACEMENT N/A 10/21/2017   Procedure: INSERTION PORT-A-CATH;  Surgeon: Marshall Skeeter, MD;  Location: [facility];  Service: General;  Laterality: N/A;    REMOVAL OF TISSUE EXPANDER AND PLACEMENT OF IMPLANT Left 05/13/2018   Procedure: REMOVAL OF TISSUE EXPANDER AND PLACEMENT OF IMPLANT;  Surgeon: Thornell Flirt, DO;  Location: [facility];  Service: Plastics;  Laterality: Left;  total surgery time should be 3 hours, per provider   REVERSE SHOULDER ARTHROPLASTY Right 11/27/2020   Procedure: REVERSE SHOULDER ARTHROPLASTY;  Surgeon: Elner Hahn, MD;  Location: [facility];  Service: Orthopedics;  Laterality: Right;   SAVORY DILATION N/A 01/12/2015   Procedure: SAVORY DILATION;  Surgeon: Cassie Click, MD;  Location: [facility] ENDOSCOPY;  Service: Endoscopy;  Laterality: N/A;   TONSILLECTOMY AND ADENOIDECTOMY  1956    Home Medications:  Allergies as of 07/21/2023       Reactions   Cytoxan  [cyclophosphamide ] Shortness Of Breath   BACK PAIN, BACK SPASM   Penicillin G Shortness Of Breath, Swelling, Rash   Tolerated 1st generation cephalosporin (CEFAZOLIN ) on 10/21/17 and 01/27/18 without ADRs.  PCN reaction causing immediate rash, facial/tongue/throat swelling, SOB or lightheadedness with  hypotension: Yes PCN reaction causing severe rash involving mucus membranes or skin necrosis: No PCN reaction that required hospitalization: No PCN reaction occurring within the last 10 years: No If all of the above answers are "NO", then may proceed with Cephalosporin use.   Taxotere  [docetaxel ] Shortness Of Breath, Palpitations, Other (See Comments)   Back pain, Back Spasms   Other Other (See Comments)   Dodecyl gallate: Positive patch test  Elta MD UV Clear SPF 46: Positive patch test  fragrance   Parabens Other (See Comments)   Positive patch test    Shellac Other (See Comments)   Positive patch test    Ciprofloxacin  Hives, Itching, Rash   Fluocinonide Rash   Tape Dermatitis   Paper tape is ok, tegaderm OK   Triamcinolone Rash        Medication List        Accurate as of Jul 21, 2023  3:06 PM. If you have any questions, ask your nurse or  doctor.          ascorbic acid  500 MG tablet Commonly known as: VITAMIN C  Take by mouth.   atenolol  25 MG tablet Commonly known as: TENORMIN  Take 0.5 tablets (12.5 mg total) by mouth 2 (two) times daily.   calcium  carbonate 1500 (600 Ca) MG Tabs tablet Commonly known as: OSCAL Take 600 mg of elemental calcium  by mouth daily with breakfast.   citalopram  20 MG tablet Commonly known as: CELEXA  Take 20 mg by mouth at bedtime.   cyanocobalamin  1000 MCG tablet Take by mouth.   desoximetasone  0.25 % cream Commonly known as: TOPICORT  Apply topically to affected areas use up to 5 days a week. Avoid applying to face, groin, and axilla. Use as directed. Long-term use can cause thinning of the skin.   Gemtesa  75 MG Tabs Generic drug: Vibegron  Take 1 tablet (75 mg total) by mouth daily.   hydroxychloroquine 200 MG tablet Commonly known as: PLAQUENIL Take 200 mg by mouth daily.   montelukast 10 MG tablet Commonly known as: SINGULAIR Take 10 mg by mouth at bedtime.   Multi-Vitamin tablet Take by mouth.   pantoprazole  40 MG tablet Commonly known as: PROTONIX  Take 40 mg by mouth 2 (two) times daily.   Premarin vaginal cream Generic drug: conjugated estrogens Place 1 Applicatorful vaginally once a week.   triamterene -hydrochlorothiazide  37.5-25 MG tablet Commonly known as: MAXZIDE -25 Take 1 tablet by mouth daily.   valACYclovir  1000 MG tablet Commonly known as: VALTREX  Take 2 tablets at first sign of symptoms, repeat in 12 hours for 1 dose then decrease to 1 tablet once daily for maintenance.   Vitamin D  50 MCG (2000 UT) Caps Take 2,000 Units by mouth daily.        Allergies:  Allergies  Allergen Reactions   Cytoxan  [Cyclophosphamide ] Shortness Of Breath    BACK PAIN, BACK SPASM   Penicillin G Shortness Of Breath, Swelling and Rash    Tolerated 1st generation cephalosporin (CEFAZOLIN ) on 10/21/17 and 01/27/18 without ADRs.  PCN reaction causing immediate rash,  facial/tongue/throat swelling, SOB or lightheadedness with hypotension: Yes PCN reaction causing severe rash involving mucus membranes or skin necrosis: No PCN reaction that required hospitalization: No PCN reaction occurring within the last 10 years: No If all of the above answers are "NO", then may proceed with Cephalosporin use.   Taxotere  [Docetaxel ] Shortness Of Breath, Palpitations and Other (See Comments)    Back pain, Back Spasms   Other Other (See Comments)    Dodecyl  gallate: Positive patch test  Elta MD UV Clear SPF 46: Positive patch test  fragrance    Parabens Other (See Comments)    Positive patch test    Shellac Other (See Comments)    Positive patch test    Ciprofloxacin  Hives, Itching and Rash   Fluocinonide Rash   Tape Dermatitis    Paper tape is ok, tegaderm OK    Triamcinolone Rash    Family History: Family History  Problem Relation Age of Onset   Breast cancer Sister 16       currently 53   Breast cancer Paternal Grandmother 55       bilateral; deceased 23s    Breast cancer Cousin 71       daughter of an unaffected maternal aunt   Breast cancer Cousin 44       kidney cancer also; daughter of an unaffected maternal aunt   Other Mother        TAH/BSO prior to menopause   Prostate cancer Paternal Grandfather        age at dx unknown   Breast cancer Maternal Aunt        age at dx unknown   Breast cancer Cousin 1       daughter of an unaffected maternal aunt    Social History:  reports that she has never smoked. She has never used smokeless tobacco. She reports that she does not drink alcohol and does not use drugs.  ROS: Pertinent ROS in HPI  Physical Exam: BP 106/68   Pulse 81   Ht 5' 6.5" (1.689 m)   Wt 167 lb (75.8 kg)   BMI 26.55 kg/m   Constitutional:  Well nourished. Alert and oriented, No acute distress. HEENT: [place] AT, moist mucus membranes.  Trachea midline Cardiovascular: No clubbing, cyanosis, or edema. Respiratory: Normal  respiratory effort, no increased work of breathing. Neurologic: Grossly intact, no focal deficits, moving all 4 extremities. Psychiatric: Normal mood and affect.    Laboratory Data: CBC w/auto Differential (5 Part) Order: [id number] Component Ref Range & Units 7 d ago  WBC (White Blood Cell Count) 4.1 - 10.2 10^3/uL 6.3  RBC (Red Blood Cell Count) 4.04 - 5.48 10^6/uL 4.3  Hemoglobin 12.0 - 15.0 gm/dL 40.1  Hematocrit 02.7 - 47.0 % 38.6  MCV (Mean Corpuscular Volume) 80.0 - 100.0 fl 89.8  MCH (Mean Corpuscular Hemoglobin) 27.0 - 31.2 pg 31.2  MCHC (Mean Corpuscular Hemoglobin Concentration) 32.0 - 36.0 gm/dL 25.3  Platelet Count 664 - 450 10^3/uL 212  RDW-CV (Red Cell Distribution Width) 11.6 - 14.8 % 12.4  MPV (Mean Platelet Volume) 9.4 - 12.4 fl 10.5  Neutrophils 1.50 - 7.80 10^3/uL 4.25  Lymphocytes 1.00 - 3.60 10^3/uL 1.27  Monocytes 0.00 - 1.50 10^3/uL 0.44  Eosinophils 0.00 - 0.55 10^3/uL 0.25  Basophils 0.00 - 0.09 10^3/uL 0.05  Neutrophil % 32.0 - 70.0 % 67.5  Lymphocyte % 10.0 - 50.0 % 20.2  Monocyte % 4.0 - 13.0 % 7  Eosinophil % 1.0 - 5.0 % 4  Basophil% 0.0 - 2.0 % 0.8  Immature Granulocyte % <=0.7 % 0.5  Immature Granulocyte Count <=0.06 10^3/L 0.03  Resulting Agency [facility]   Specimen Collected: 07/14/23 14:24   Performed by: [facility] Last Resulted: 07/14/23 16:08  Received From: [facility]  Result Received: 07/20/23 14:07   Comprehensive Metabolic Panel (CMP) Order: [id number] Component Ref Range & Units 7 d ago  Glucose  70 - 110 mg/dL 811 High   Sodium 914 - 145 mmol/L 139  Potassium 3.6 - 5.1 mmol/L 4.1  Chloride 97 - 109 mmol/L 105  Carbon Dioxide (CO2) 22.0 - 32.0 mmol/L 25.5  Urea Nitrogen (BUN) 7 - 25 mg/dL 19  Creatinine 0.6 - 1.1 mg/dL 1.1  Glomerular Filtration Rate (eGFR) >60 mL/min/1.73sq m 52 Low   Comment: CKD-EPI (2021) does not include patient's race in the  calculation of eGFR.  Monitoring changes of plasma creatinine and eGFR over time is useful for monitoring kidney function.  Interpretive Ranges for eGFR (CKD-EPI 2021):  eGFR:       >60 mL/min/1.73 sq. m - Normal eGFR:       30-59 mL/min/1.73 sq. m - Moderately Decreased eGFR:       15-29 mL/min/1.73 sq. m  - Severely Decreased eGFR:       < 15 mL/min/1.73 sq. m  - Kidney Failure   Note: These eGFR calculations do not apply in acute situations when eGFR is changing rapidly or patients on dialysis.  Calcium  8.7 - 10.3 mg/dL 9.3  AST 8 - 39 U/L 15  ALT 5 - 38 U/L 15  Alk Phos (alkaline Phosphatase) 34 - 104 U/L 101  Albumin 3.5 - 4.8 g/dL 4.2  Bilirubin, Total 0.3 - 1.2 mg/dL 0.8  Protein, Total 6.1 - 7.9 g/dL 6.3  A/G Ratio 1.0 - 5.0 gm/dL 2  Resulting Agency [facility]   Specimen Collected: 07/14/23 14:24   Performed by: [facility] Last Resulted: 07/14/23 17:52  Received From: [facility]  Result Received: 07/20/23 14:07   Hemoglobin A1C Order: [id number] Component Ref Range & Units 7 d ago  Hemoglobin A1C 4.2 - 5.6 % 6.1 High   Average Blood Glucose (Calc) mg/dL 086  Resulting Agency [facility]  Narrative Performed by [facility] Normal Range:    4.2 - 5.6% Increased Risk:  5.7 - 6.4% Diabetes:        >= 6.5% Glycemic Control for adults with diabetes:  <7%    Specimen Collected: 07/14/23 14:24   Performed by: [facility] Last Resulted: 07/14/23 15:50  Received From: [facility]  Result Received: 07/20/23 14:07  I have reviewed the labs.   Pertinent Imaging: Results for orders placed or performed in visit on 07/21/23  BLADDER SCAN AMB NON-IMAGING   Collection Time: 07/21/23  2:39 PM  Result Value Ref Range   Scan Result 0ml      Assessment & Plan:    1. 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  These notes generated with voice recognition software. I apologize for typographical errors.  Briant Camper  [facility] [address]  [address] [place], [place] [address] [phone]

## 2023-07-22 ENCOUNTER — Other Ambulatory Visit: Payer: Self-pay | Admitting: Urology

## 2023-07-22 DIAGNOSIS — N3946 Mixed incontinence: Secondary | ICD-10-CM

## 2023-07-28 ENCOUNTER — Encounter (INDEPENDENT_AMBULATORY_CARE_PROVIDER_SITE_OTHER): Payer: Self-pay

## 2023-09-10 ENCOUNTER — Other Ambulatory Visit: Payer: Self-pay | Admitting: General Surgery

## 2023-09-10 DIAGNOSIS — Z1231 Encounter for screening mammogram for malignant neoplasm of breast: Secondary | ICD-10-CM

## 2023-10-13 ENCOUNTER — Ambulatory Visit (INDEPENDENT_AMBULATORY_CARE_PROVIDER_SITE_OTHER): Payer: Medicare Other | Admitting: Vascular Surgery

## 2023-10-13 ENCOUNTER — Encounter (INDEPENDENT_AMBULATORY_CARE_PROVIDER_SITE_OTHER): Payer: Self-pay | Admitting: Vascular Surgery

## 2023-10-13 ENCOUNTER — Ambulatory Visit (INDEPENDENT_AMBULATORY_CARE_PROVIDER_SITE_OTHER): Payer: Medicare Other

## 2023-10-13 VITALS — BP 124/66 | HR 71 | Ht 66.0 in | Wt 170.0 lb

## 2023-10-13 DIAGNOSIS — I728 Aneurysm of other specified arteries: Secondary | ICD-10-CM

## 2023-10-13 DIAGNOSIS — I10 Essential (primary) hypertension: Secondary | ICD-10-CM

## 2023-10-13 NOTE — Assessment & Plan Note (Signed)
 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  No role for intervention at this level. Follow up in one year with duplex.

## 2023-10-13 NOTE — Progress Notes (Signed)
 MRN : [id number]  Mariah Hogan is a 73 y.o. (04/01/1950) female who presents with chief complaint of  Chief Complaint  Patient presents with   Follow-up  .  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 She is doing well with no complaints. 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   Current Outpatient Medications  Medication Sig Dispense Refill   ascorbic acid  (VITAMIN C ) 500 MG tablet Take by mouth.     atenolol  (TENORMIN ) 25 MG tablet Take 0.5 tablets (12.5 mg total) by mouth 2 (two) times daily. 30 tablet 0   calcium  carbonate (OSCAL) 1500 (600 Ca) MG TABS tablet Take 600 mg of elemental calcium  by mouth daily with breakfast.     Cholecalciferol  (VITAMIN D ) 50 MCG (2000 UT) CAPS Take 2,000 Units by mouth daily.      citalopram  (CELEXA ) 20 MG tablet Take 20 mg by mouth at bedtime.      cyanocobalamin  1000 MCG tablet Take by mouth.     desoximetasone  (TOPICORT ) 0.25 % cream Apply topically to affected areas use up to 5 days a week. Avoid applying to face, groin, and axilla. Use as directed. Long-term use can cause thinning of the skin. 60 g 5   hydroxychloroquine (PLAQUENIL) 200 MG tablet Take 200 mg by mouth daily.     montelukast (SINGULAIR) 10 MG tablet Take 10 mg by mouth at bedtime.     Multiple Vitamin (MULTI-VITAMIN) tablet Take by mouth.     pantoprazole  (PROTONIX ) 40 MG tablet Take 40 mg by mouth 2 (two) times daily.     PREMARIN vaginal cream Place 1 Applicatorful vaginally once a week.      triamterene -hydrochlorothiazide  (MAXZIDE -25) 37.5-25 MG tablet Take 1 tablet by mouth daily. 30 tablet 0   valACYclovir  (VALTREX ) 1000 MG tablet Take 2 tablets at first sign of symptoms, repeat in 12 hours for 1 dose then decrease to 1 tablet once daily for maintenance. 180 tablet 3   Vibegron  (GEMTESA ) 75 MG TABS Take 1 tablet (75 mg total) by mouth daily. 90 tablet 3   No current  facility-administered medications for this visit.    Past Medical History:  Diagnosis Date   Anemia    Arthritis    Asthma    H/O YEARS AGO-NOW HAS COUGHING SPELLS WHICH IS WHY SHE HAS HER ALBUTEROL  INHALER   Basal cell carcinoma    R nose ([facility])   Basal cell carcinoma 01/29/2016   Right forehead above med brow   Basal cell carcinoma 10/12/2019   L nasal tip - [facility] 05/15/2020   BRCA gene mutation negative 08/12/2017   Variant of unknown significance at APC only abnormality.   Breast cancer (HCC) 08/04/2017   1.8 cm ER 90%, PR 20%, HER-2/neu negative invasive mammary carcinoma of the left upper outer quadrant.  MammaPrint: High risk.   Cancer (HCC)    [phone] basal cell carcinoma   Complication of anesthesia    HARD TIME WAKING UP   Dysplastic nevus 02/21/2010   Right mid pretibial, mild   Dysrhythmia    TACHYCARDIA   GERD (gastroesophageal reflux disease)    History of hiatal hernia    SMALL   Hyperlipidemia    Hypertension    PT STATES IN 01-18-18 PREOP PHONE INTERVIEW THAT HER BP HAS BEEN ELEVATED SINCE STARTING ON VALSARTAN- PCP SWITCHED PT BACK TO MAXIDE ON 01-18-18 AND PT STATES THAT  SHE HAS LOST 6-10 POUNDS OF FLUID SINCE RESTARTING MAXIDE.     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   BREAST BIOPSY Right 08/2017   MRI biopsy, FIBROCYSTIC CHANGE AND USUAL DUCTAL HYPERPLASIA,, clip placed   BREAST CYST EXCISION  x3   BREAST EXCISIONAL BIOPSY Bilateral    BREAST RECONSTRUCTION WITH PLACEMENT OF TISSUE  EXPANDER AND FLEX HD (ACELLULAR HYDRATED DERMIS) Left 01/27/2018   Procedure: BREAST RECONSTRUCTION WITH PLACEMENT OF TISSUE EXPANDER AND FLEX HD (ACELLULAR HYDRATED DERMIS);  Surgeon: Lowery Estefana RAMAN, DO;  Location: [facility];  Service: Plastics;  Laterality: Left;   BREAST SURGERY Left 02/13/14   Fibrocystic changes, pseudo-angiomatous stromal hyperplasia. No atypia or malignancy.   CARPAL TUNNEL RELEASE Bilateral x2   CHOLECYSTECTOMY  2008   COLONOSCOPY WITH PROPOFOL  N/A 01/12/2015   Procedure: COLONOSCOPY WITH PROPOFOL ;  Surgeon: Lamar DASEN Holmes, MD;  Location: [facility] ENDOSCOPY;  Service: Endoscopy;  Laterality: N/A;   COLONOSCOPY WITH PROPOFOL  N/A 02/06/2020   Procedure: COLONOSCOPY WITH PROPOFOL ;  Surgeon: Maryruth Ole DASEN, MD;  Location: [facility];  Service: Endoscopy;  Laterality: N/A;   ESOPHAGEAL DILATION  03/27/2023   Procedure: ESOPHAGEAL DILATION;  Surgeon: Maryruth Ole DASEN, MD;  Location: [facility];  Service: Endoscopy;;   ESOPHAGOGASTRODUODENOSCOPY (EGD) WITH PROPOFOL  N/A 01/12/2015   Procedure: ESOPHAGOGASTRODUODENOSCOPY (EGD) WITH PROPOFOL ;  Surgeon: Lamar DASEN Holmes, MD;  Location: [facility] ENDOSCOPY;  Service: Endoscopy;  Laterality: N/A;   ESOPHAGOGASTRODUODENOSCOPY (EGD) WITH PROPOFOL  N/A 02/06/2020   Procedure: ESOPHAGOGASTRODUODENOSCOPY (EGD) WITH PROPOFOL ;  Surgeon: Maryruth Ole DASEN, MD;  Location: [facility];  Service: Endoscopy;  Laterality: N/A;   ESOPHAGOGASTRODUODENOSCOPY (EGD) WITH PROPOFOL  N/A 03/27/2023   Procedure: ESOPHAGOGASTRODUODENOSCOPY (EGD) WITH PROPOFOL ;  Surgeon: Maryruth Ole DASEN, MD;  Location: [facility];  Service: Endoscopy;  Laterality: N/A;   LIPOSUCTION WITH LIPOFILLING Left 12/23/2018   Procedure: LIPOSUCTION WITH LIPOFILLING FROM ABDOMEN TO LEFT BREAST;  Surgeon: Lowery Estefana RAMAN, DO;  Location: [facility];  Service: Plastics;  Laterality: Left;  90 min, please   MASTECTOMY Left 2019   [facility]   MASTECTOMY  W/ SENTINEL NODE BIOPSY Left 01/27/2018   ypT1c ypN0; ER 90%, PR 20%, HER-2/neu not overexpressed.  MASTECTOMY WITH SENTINEL LYMPH NODE BIOPSY;  Surgeon: Dessa Reyes ORN, MD;  Location: [facility];  Service: General;  Laterality: Left;   MINOR BREAST BIOPSY Right 09/02/2017   Radiology perform procedure, fibrocystic changes right retroareolar area.   NASAL RECONSTRUCTION  1983   OOPHORECTOMY     POLYPECTOMY  03/27/2023   Procedure: POLYPECTOMY;  Surgeon: Maryruth Ole DASEN, MD;  Location: [facility];  Service: Endoscopy;;   PORT-A-CATH REMOVAL     PORTACATH PLACEMENT N/A 10/21/2017   Procedure: INSERTION PORT-A-CATH;  Surgeon: Dessa Reyes ORN, MD;  Location: [facility];  Service: General;  Laterality: N/A;   REMOVAL OF TISSUE EXPANDER AND PLACEMENT OF IMPLANT Left 05/13/2018   Procedure: REMOVAL OF TISSUE EXPANDER AND PLACEMENT OF IMPLANT;  Surgeon: Lowery Estefana RAMAN, DO;  Location: [facility];  Service: Plastics;  Laterality: Left;  total surgery time should be 3 hours, per provider   REVERSE SHOULDER ARTHROPLASTY Right 11/27/2020   Procedure: REVERSE SHOULDER ARTHROPLASTY;  Surgeon: Edie Norleen PARAS, MD;  Location: [facility];  Service: Orthopedics;  Laterality: Right;   SAVORY DILATION N/A 01/12/2015   Procedure: SAVORY DILATION;  Surgeon: Lamar ONEIDA Holmes, MD;  Location: [facility] ENDOSCOPY;  Service: Endoscopy;  Laterality: N/A;   TONSILLECTOMY AND ADENOIDECTOMY  1956     Social History   Tobacco Use   Smoking status: Never   Smokeless tobacco: Never  Vaping Use   Vaping status: Never Used  Substance Use Topics   Alcohol use: No    Alcohol/week: 0.0 standard drinks of alcohol   Drug use: No      Family History  Problem Relation Age of Onset   Breast cancer Sister 26       currently 12   Breast cancer Paternal Grandmother 23       bilateral; deceased 58s    Breast cancer Cousin 59       daughter of an unaffected maternal aunt   Breast cancer Cousin 67       kidney cancer  also; daughter of an unaffected maternal aunt   Other Mother        TAH/BSO prior to menopause   Prostate cancer Paternal Grandfather        age at dx unknown   Breast cancer Maternal Aunt        age at dx unknown   Breast cancer Cousin 38       daughter of an unaffected maternal aunt     Allergies  Allergen Reactions   Cytoxan  [Cyclophosphamide ] Shortness Of Breath    BACK PAIN, BACK SPASM   Penicillin G Shortness Of Breath, Swelling and Rash    Tolerated 1st generation cephalosporin (CEFAZOLIN ) on 10/21/17 and 01/27/18 without ADRs.  PCN reaction causing immediate rash, facial/tongue/throat swelling, SOB or lightheadedness with hypotension: Yes PCN reaction causing severe rash involving mucus membranes or skin necrosis: No PCN reaction that required hospitalization: No PCN reaction occurring within the last 10 years: No If all of the above answers are NO, then may proceed with Cephalosporin use.   Taxotere  [Docetaxel ] Shortness Of Breath, Palpitations and Other (See Comments)    Back pain, Back Spasms   Other Other (See Comments)    Dodecyl gallate: Positive patch test  Elta MD UV Clear SPF 46: Positive patch test  fragrance    Parabens Other (See Comments)    Positive patch test    Shellac Other (See Comments)    Positive patch test    Ciprofloxacin  Hives, Itching and Rash   Fluocinonide Rash   Tape Dermatitis    Paper tape is ok, tegaderm OK    Triamcinolone Rash     REVIEW OF SYSTEMS (Negative unless checked)   Constitutional: [] Weight loss  [] Fever  [] Chills Cardiac: [] Chest pain   [] Chest pressure   [x] Palpitations   [] Shortness of breath when laying flat   [] Shortness of breath at rest   [] Shortness of breath with exertion. Vascular:  [] Pain in legs with walking   [] Pain in legs at rest   [] Pain in legs when laying flat   [] Claudication   [] Pain in feet when walking  [] Pain in feet at rest  [] Pain in feet when laying flat   [] History of DVT   [] Phlebitis    [] Swelling in legs   [] Varicose  veins   [] Non-healing ulcers Pulmonary:   [] Uses home oxygen   [] Productive cough   [] Hemoptysis   [] Wheeze  [] COPD   [x] Asthma Neurologic:  [] Dizziness  [] Blackouts   [] Seizures   [] History of stroke   [] History of TIA  [] Aphasia   [] Temporary blindness   [] Dysphagia   [] Weakness or numbness in arms   [] Weakness or numbness in legs Musculoskeletal:  [x] Arthritis   [] Joint swelling   [x] Joint pain   [] Low back pain Hematologic:  [] Easy bruising  [] Easy bleeding   [] Hypercoagulable state   [x] Anemic  [] Hepatitis Gastrointestinal:  [] Blood in stool   [] Vomiting blood  [x] Gastroesophageal reflux/heartburn   [] Abdominal pain Genitourinary:  [] Chronic kidney disease   [] Difficult urination  [] Frequent urination  [] Burning with urination   [] Hematuria Skin:  [] Rashes   [] Ulcers   [] Wounds Psychological:  [] History of anxiety   []  History of major depression.  Physical Examination  BP 124/66   Pulse 71   Ht 5' 6 (1.676 m)   Wt 170 lb (77.1 kg)   BMI 27.44 kg/m  Gen:  WD/WN, NAD. Appears younger than stated age. Head: [place]/AT, No temporalis wasting. Ear/Nose/Throat: Hearing grossly intact, nares w/o erythema or drainage Eyes: Conjunctiva clear. Sclera non-icteric Neck: Supple.  Trachea midline Pulmonary:  Good air movement, no use of accessory muscles.  Cardiac: RRR, no JVD Vascular:  Vessel Right Left  Radial Palpable Palpable                                   Gastrointestinal: soft, non-tender/non-distended. No guarding/reflex.  Musculoskeletal: M/S 5/5 throughout.  No deformity or atrophy. No edema. Neurologic: Sensation grossly intact in extremities.  Symmetrical.  Speech is fluent.  Psychiatric: Judgment intact, Mood & affect appropriate for pt's clinical situation. Dermatologic: No rashes or ulcers noted.  No cellulitis or open wounds.      Labs Recent Results (from the past 2160 hours)  BLADDER SCAN AMB NON-IMAGING     Status: None    Collection Time: 07/21/23  2:39 PM  Result Value Ref Range   Scan Result 0ml     Radiology No results found.  Assessment/Plan  Hypertension blood pressure control important in reducing the progression of atherosclerotic disease and aneurysmal growth. On appropriate oral medications.   Splenic artery aneurysm (HCC) Her duplex today shows a stable splenic artery aneurysm of 0.9 cm in maximal diameter.  No role for intervention at this level. Follow up in one year with duplex.    Selinda Gu, MD  10/13/2023 10:05 AM    This note was created with Dragon medical transcription system.  Any errors from dictation are purely unintentional

## 2023-10-13 NOTE — Assessment & Plan Note (Signed)
blood pressure control important in reducing the progression of atherosclerotic disease and aneurysmal growth. On appropriate oral medications.  

## 2023-10-29 ENCOUNTER — Ambulatory Visit
Admission: RE | Admit: 2023-10-29 | Discharge: 2023-10-29 | Disposition: A | Discharge status: Home / Self Care | Source: Ambulatory Visit | Attending: General Surgery | Admitting: General Surgery

## 2023-10-29 DIAGNOSIS — Z1231 Encounter for screening mammogram for malignant neoplasm of breast: Secondary | ICD-10-CM | POA: Diagnosis present

## 2023-11-02 ENCOUNTER — Encounter: Payer: Self-pay | Admitting: General Surgery

## 2023-11-02 ENCOUNTER — Other Ambulatory Visit: Payer: Self-pay | Admitting: General Surgery

## 2023-11-02 ENCOUNTER — Ambulatory Visit (INDEPENDENT_AMBULATORY_CARE_PROVIDER_SITE_OTHER): Admitting: Dermatology

## 2023-11-02 DIAGNOSIS — L821 Other seborrheic keratosis: Secondary | ICD-10-CM

## 2023-11-02 DIAGNOSIS — Z7189 Other specified counseling: Secondary | ICD-10-CM

## 2023-11-02 DIAGNOSIS — L820 Inflamed seborrheic keratosis: Secondary | ICD-10-CM

## 2023-11-02 DIAGNOSIS — T50905A Adverse effect of unspecified drugs, medicaments and biological substances, initial encounter: Secondary | ICD-10-CM

## 2023-11-02 DIAGNOSIS — L509 Urticaria, unspecified: Secondary | ICD-10-CM | POA: Diagnosis not present

## 2023-11-02 DIAGNOSIS — R928 Other abnormal and inconclusive findings on diagnostic imaging of breast: Secondary | ICD-10-CM

## 2023-11-02 NOTE — Progress Notes (Unsigned)
 Follow Up Visit   Subjective  Mariah Hogan is a 73 y.o. female who presents for the following: Rash - hx urticaria about 1.5 wks ago, was seen by [facility] urgent care, was prescribed prednisone  taper (pt has 6 days left out of 12), and itching/rash improved. The only change pt can think of was an increase in Lyrica dosage. Rash and itching started on the scalp, legs, and then spread to the back. Pt did use Topicort  cream, but it didn't seem to clear rash. Pt would like to know if increasing Lyrica was the cause, because it really improved her neuropathy symptoms, and since she has been off of it for about two weeks symptoms are returning.  Pt would like to discuss removal of dark spots on the face.  Pt c/o irritated lesions on the scalp that she would like checked today.  The following portions of the chart were reviewed this encounter and updated as appropriate: medications, allergies, medical history  Review of Systems:  No other skin or systemic complaints except as noted in HPI or Assessment and Plan.  Objective  Well appearing patient in no apparent distress; mood and affect are within normal limits.  A focused examination was performed of the following areas: The face, trunk, and extremities  Relevant exam findings are noted in the Assessment and Plan. Scalp x 3 (3) Erythematous stuck-on, waxy papule or plaque  Assessment & Plan  INFLAMED SEBORRHEIC KERATOSIS (3) Scalp x 3 (3) Symptomatic, irritating, patient would like treated.  Destruction of lesion - Scalp x 3 (3) Complexity: simple   Destruction method: cryotherapy   Informed consent: discussed and consent obtained   Timeout:  patient name, date of birth, surgical site, and procedure verified Lesion destroyed using liquid nitrogen: Yes   Region frozen until ice ball extended beyond lesion: Yes   Outcome: patient tolerated procedure well with no complications   Post-procedure details: wound care instructions given      URTICARIA - likely due to increase in Lyrica dosage 1.5 wks ago.  Exam: edematous pink papules and plaques +/- on the back (mild), no dermatographism on the L forearm.  Chronic and persistent condition with duration or expected duration over one year. Condition is improving with treatment but not currently at goal.  Urticaria or hives is a pink to red patchy whelp- like rash of the skin that typically itches and it is the result of histamine release in the skin.   Hives may have multiple causes including stress, medications, infections, and systemic illness.  Sometimes there is a family history of chronic urticaria.   Physical urticarias may be caused by pressure (dermatographism), heat, sun, cold, vibration.  Insect bites can cause papular urticaria. It is often difficult to find the cause of generalized hives.  Statistically, 70% of the time a cause of generalized hives is not found.  Sometimes hives can spontaneously resolve. Other times hives can persist and when it does, and no cause is found, and it has been at least 6 weeks since started, it is called chronic idiopathic urticaria. Antihistamines are the mainstay for treatment.  In severe cases Xolair injections may be used.   Treatment Plan: Do not recommend restarting Lyrica. Finish prednisone  taper as prescribed by [facility] urgent care. Discussed restarting prednisone , or starting Dupixent or Xolair injection if pt flares again in the future.  Pt will send a MyChart message on 11/11/23 to report on condition, and if flared will work her in.   SEBORRHEIC  KERATOSIS - Face, discussed not covered by insurance, $60 for 1st lesion, $15 for each thereafter, or if greater than 20 a flat rate fee of $350. Previously tx L preauricular and pt states lesion healed well. Avoid sun exposure, especially prior to, and after, tx with LN2. Pt will not need prophylactic antibiotics. - Stuck-on, waxy, tan-brown papules and/or plaques  -  Benign-appearing - Discussed benign etiology and prognosis. - Observe - Call for any changes  Return for appointment as scheduled.  Mariah Hogan, CMA, am acting as scribe for Alm Rhyme, MD .  Documentation: I have reviewed the above documentation for accuracy and completeness, and I agree with the above.  Alm Rhyme, MD

## 2023-11-02 NOTE — Patient Instructions (Addendum)
 SEBORRHEIC KERATOSIS - Face, discussed not covered by insurance, $60 for 1st lesion, $15 for each thereafter, or if greater than 20 a flat rate fee of $350. Previously tx L preauricular and pt states lesion healed well. Avoid sun exposure, especially prior to, and after, tx with LN2. Pt will not need prophylactic antibiotics. - Stuck-on, waxy, tan-brown papules and/or plaques  - Benign-appearing - Discussed benign etiology and prognosis. - Observe - Call for any changes  Due to recent changes in healthcare laws, you may see results of your pathology and/or laboratory studies on MyChart before the doctors have had a chance to review them. We understand that in some cases there may be results that are confusing or concerning to you. Please understand that not all results are received at the same time and often the doctors may need to interpret multiple results in order to provide you with the best plan of care or course of treatment. Therefore, we ask that you please give us  2 business days to thoroughly review all your results before contacting the office for clarification. Should we see a critical lab result, you will be contacted sooner.   If You Need Anything After Your Visit  If you have any questions or concerns for your doctor, please call our main line at [phone] and press option 4 to reach your doctor's medical assistant. If no one answers, please leave a voicemail as directed and we will return your call as soon as possible. Messages left after 4 pm will be answered the following business day.   You may also send us  a message via MyChart. We typically respond to MyChart messages within 1-2 business days.  For prescription refills, please ask your pharmacy to contact our office. Our fax number is [phone].  If you have an urgent issue when the clinic is closed that cannot wait until the next business day, you can page your doctor at the number below.    Please note that while we  do our best to be available for urgent issues outside of office hours, we are not available 24/7.   If you have an urgent issue and are unable to reach us , you may choose to seek medical care at your doctor's office, retail clinic, urgent care center, or emergency room.  If you have a medical emergency, please immediately call 911 or go to the emergency department.  Pager Numbers  - Dr. Hester: [phone]  - Dr. Jackquline: [phone]  - Dr. Claudene: [phone]   - Dr. Raymund: [phone]  In the event of inclement weather, please call our main line at [phone] for an update on the status of any delays or closures.  Dermatology Medication Tips: Please keep the boxes that topical medications come in in order to help keep track of the instructions about where and how to use these. Pharmacies typically print the medication instructions only on the boxes and not directly on the medication tubes.   If your medication is too expensive, please contact our office at [phone] option 4 or send us  a message through MyChart.   We are unable to tell what your co-pay for medications will be in advance as this is different depending on your insurance coverage. However, we may be able to find a substitute medication at lower cost or fill out paperwork to get insurance to cover a needed medication.   If a prior authorization is required to get your medication covered by your insurance company, please allow us  1-2 business  days to complete this process.  Drug prices often vary depending on where the prescription is filled and some pharmacies may offer cheaper prices.  The website www.goodrx.com contains coupons for medications through different pharmacies. The prices here do not account for what the cost may be with help from insurance (it may be cheaper with your insurance), but the website can give you the price if you did not use any insurance.  - You can print the associated coupon and  take it with your prescription to the pharmacy.  - You may also stop by our office during regular business hours and pick up a GoodRx coupon card.  - If you need your prescription sent electronically to a different pharmacy, notify our office through [facility] or by phone at [phone] option 4.     Si Usted Necesita Algo Despus de Su Visita  Tambin puede enviarnos un mensaje a travs de Clinical cytogeneticist. Por lo general respondemos a los mensajes de MyChart en el transcurso de 1 a 2 das hbiles.  Para renovar recetas, por favor pida a su farmacia que se ponga en contacto con nuestra oficina. Randi lakes de fax es [place] [phone].  Si tiene un asunto urgente cuando la clnica est cerrada y que no puede esperar hasta el siguiente da hbil, puede llamar/localizar a su doctor(a) al nmero que aparece a continuacin.   Por favor, tenga en cuenta que aunque hacemos todo lo posible para estar disponibles para asuntos urgentes fuera del horario de [place], no estamos disponibles las 24 horas del da, los 7 [address]  [address] de la [place].   Si tiene un problema urgente y no puede comunicarse con nosotros, puede optar por buscar atencin mdica  en el consultorio de su doctor(a), en una clnica privada, en un centro de atencin urgente o en una sala de emergencias.  Si tiene Engineer, drilling, por favor llame inmediatamente al 911 o vaya a la sala de emergencias.  Nmeros de bper  - Dr. Hester: [phone]  - Dra. Jackquline: [phone]  - Dr. Claudene: [phone]  - Dra. Kitts: [phone]  En caso de inclemencias del [place], por favor llame a nuestra lnea principal al [phone] para una actualizacin sobre el estado de cualquier retraso o cierre.  Consejos para la medicacin en dermatologa: Por favor, guarde las cajas en las que vienen los medicamentos de uso tpico para ayudarle a seguir las instrucciones sobre dnde y cmo usarlos. Las farmacias generalmente imprimen las  instrucciones del medicamento slo en las cajas y no directamente en los tubos del [place].   Si su medicamento es muy caro, por favor, pngase en contacto con landry rieger llamando al [phone] y presione la opcin 4 o envenos un mensaje a travs de Clinical cytogeneticist.   No podemos decirle cul ser su copago por los medicamentos por adelantado ya que esto es diferente dependiendo de la cobertura de su seguro. Sin embargo, es posible que podamos encontrar un medicamento sustituto a Audiological scientist un formulario para que el seguro cubra el medicamento que se considera necesario.   Si se requiere una autorizacin previa para que su compaa de seguros malta su medicamento, por favor permtanos de 1 a 2 das hbiles para completar este proceso.  Los precios de los medicamentos varan con frecuencia dependiendo del Environmental consultant de dnde se surte la receta y alguna farmacias pueden ofrecer precios ms baratos.  El sitio web www.goodrx.com tiene cupones para medicamentos de Health and safety inspector. Los precios aqu no tienen en cuenta lo  que podra costar con la ayuda del seguro (puede ser ms barato con su seguro), pero el sitio web puede darle el precio si no Visual merchandiser.  - Puede imprimir el cupn correspondiente y llevarlo con su receta a la farmacia.  - Tambin puede pasar por nuestra oficina durante el horario de atencin regular y Education officer, museum una tarjeta de cupones de GoodRx.  - Si necesita que su receta se enve electrnicamente a una farmacia diferente, informe a nuestra oficina a travs de MyChart de [place] o por telfono llamando al [phone] y presione la opcin 4.

## 2023-11-03 ENCOUNTER — Encounter: Payer: Self-pay | Admitting: Dermatology

## 2023-11-05 ENCOUNTER — Ambulatory Visit
Admission: RE | Admit: 2023-11-05 | Discharge: 2023-11-05 | Disposition: A | Discharge status: Home / Self Care | Source: Ambulatory Visit | Attending: General Surgery | Admitting: General Surgery

## 2023-11-05 DIAGNOSIS — R928 Other abnormal and inconclusive findings on diagnostic imaging of breast: Secondary | ICD-10-CM | POA: Insufficient documentation

## 2023-11-11 ENCOUNTER — Ambulatory Visit
Admission: RE | Admit: 2023-11-11 | Discharge: 2023-11-11 | Disposition: A | Discharge status: Home / Self Care | Source: Ambulatory Visit | Attending: General Surgery | Admitting: General Surgery

## 2023-11-11 DIAGNOSIS — R928 Other abnormal and inconclusive findings on diagnostic imaging of breast: Secondary | ICD-10-CM

## 2023-11-11 HISTORY — PX: BREAST BIOPSY: SHX20

## 2023-11-11 MED ORDER — LIDOCAINE-EPINEPHRINE 1 %-1:100000 IJ SOLN
20.0000 mL | Freq: Once | INTRAMUSCULAR | Status: AC
  Administered 2023-11-11: 20 mL
  Filled 2023-11-11: qty 20

## 2023-11-11 MED ORDER — LIDOCAINE 1 % OPTIME INJ - NO CHARGE
5.0000 mL | Freq: Once | INTRAMUSCULAR | Status: AC
  Administered 2023-11-11: 5 mL
  Filled 2023-11-11: qty 6

## 2023-11-12 LAB — SURGICAL PATHOLOGY

## 2023-11-16 ENCOUNTER — Other Ambulatory Visit: Payer: Self-pay | Admitting: General Surgery

## 2023-11-16 ENCOUNTER — Encounter: Payer: Self-pay | Admitting: Dermatology

## 2023-11-16 ENCOUNTER — Ambulatory Visit (INDEPENDENT_AMBULATORY_CARE_PROVIDER_SITE_OTHER): Admitting: Dermatology

## 2023-11-16 DIAGNOSIS — L299 Pruritus, unspecified: Secondary | ICD-10-CM | POA: Diagnosis not present

## 2023-11-16 DIAGNOSIS — L509 Urticaria, unspecified: Secondary | ICD-10-CM

## 2023-11-16 DIAGNOSIS — L501 Idiopathic urticaria: Secondary | ICD-10-CM

## 2023-11-16 DIAGNOSIS — Z7189 Other specified counseling: Secondary | ICD-10-CM

## 2023-11-16 DIAGNOSIS — Z79899 Other long term (current) drug therapy: Secondary | ICD-10-CM

## 2023-11-16 DIAGNOSIS — N6321 Unspecified lump in the left breast, upper outer quadrant: Secondary | ICD-10-CM

## 2023-11-16 MED ORDER — DUPILUMAB 300 MG/2ML ~~LOC~~ SOAJ
600.0000 mg | Freq: Once | SUBCUTANEOUS | Status: AC
  Administered 2023-11-16: 600 mg via SUBCUTANEOUS

## 2023-11-16 NOTE — Progress Notes (Signed)
 Follow-Up Visit   Subjective  Mariah Hogan is a 73 y.o. female who presents for the following: 2 weeks f/u breaking out in hives all over her body, started ~ 3 days ago,  Patient stopped prednisone  tablets 9 days ago,  Patient report increase in Lyrica dosage when she first noticed hives, patient stopped Lyrica ~ 4 or 5 weeks ago  . Rash and itching started on the scalp, legs, and then spread to the back.   The following portions of the chart were reviewed this encounter and updated as appropriate: medications, allergies, medical history  Review of Systems:  No other skin or systemic complaints except as noted in HPI or Assessment and Plan.  Objective  Well appearing patient in no apparent distress; mood and affect are within normal limits.  A focused examination was performed of the following areas: Face,scalp,back,chest   Relevant exam findings are noted in the Assessment and Plan.         Assessment & Plan   URTICARIA with Pruritus Chronic idiopathic urticaria   Vs urticarial medication reaction from Lyrica (pt is off Lyrica)  Exam: photos on pt phone show edematous pink papules and plaques +/- on the back (mild),  no dermatographism elicited on the L forearm.   Chronic and persistent condition with duration or expected duration for greater than 6 weeks. Condition is bothersome/symptomatic for patient. Currently flared.   Urticaria or hives is a pink to red patchy whelp- like rash of the skin that typically itches and it is the result of histamine release in the skin.   Hives may have multiple causes including stress, medications, infections, and systemic illness.  Sometimes there is a family history of chronic urticaria.   Physical urticarias may be caused by pressure (dermatographism), heat, sun, cold, vibration.  Insect bites can cause papular urticaria. It is often difficult to find the cause of generalized hives.  Statistically, 70% of the time a cause of  generalized hives is not found.  Sometimes hives can spontaneously resolve. Other times hives can persist and when it does, and no cause is found, and it has been at least 6 weeks since started, it is called chronic idiopathic urticaria. Antihistamines are the mainstay for treatment.  In severe cases Xolair injections may be used.   Restart Zyrtec 10 mg take as directed  For Hives (urticaria); dermatographism or Itch: Start non sedating antihistamine (either Allegra 180mg , or Claritin  10mg , or Zyrtec 10mg ) daily.  All these are non-prescription (Over the Counter).   Start out with 1 pill a day.   After a week if not improving may increase to 2 pills a day.   After another week if not improving may increase to 3 pills a day.   After another week if still not improving may take up to 4 pills a day. Stay at highest dose that keeps condition controlled, but only up to 4 pills a day. Stay at the controlling dose for at least 2 weeks. Contact office if taking 4 pills of antihistamine a day for at least 2 weeks without control of condition as other options may be available.  Since pt had to have oral systemic steroids to get relief in recent past (prescribed by Urgent Care), we will need to be more aggressive than oral antihistamines today. Start Dupixent  injection sample given  300 mg/2 ml injected to the right thigh and left thigh, patient tolerated injections well. Lot [id number] Exp 03/09/2025  Dupilumab  (Dupixent ) is a biologic treatment  given by injection for adults and children with moderate-to-severe atopic dermatitis. Goal is control of skin condition, not cure. It is given as 2 injections at the first dose followed by 1 injection every 2 weeks thereafter.  Young children are dosed monthly.  Potential side effects include allergic reaction, herpes infections, injection site reactions and conjunctivitis (inflammation of the eyes).  The use of Dupixent  requires long term medication management,  including periodic office visits.    URTICARIA   Related Medications Dupilumab  SOAJ 600 mg  COUNSELING AND COORDINATION OF CARE   MEDICATION MANAGEMENT   CHRONIC IDIOPATHIC URTICARIA   PRURITUS    Return in about 2 weeks (around 11/30/2023) for Dupixent  .  I, Fay Kirks, CMA, am acting as scribe for Alm Rhyme, MD .   Documentation: I have reviewed the above documentation for accuracy and completeness, and I agree with the above.  Alm Rhyme, MD

## 2023-11-16 NOTE — Patient Instructions (Addendum)
 For Hives (urticaria); dermatographism or Itch: Start non sedating antihistamine (either Allegra 180mg , or Claritin  10mg , or Zyrtec 10mg ) daily.  All these are non-prescription (Over the Counter).   Start out with 1 pill a day.   After a week if not improving may increase to 2 pills a day.   After another week if not improving may increase to 3 pills a day.   After another week if still not improving may take up to 4 pills a day. Stay at highest dose that keeps condition controlled, but only up to 4 pills a day. Stay at the controlling dose for at least 2 weeks. Contact office if taking 4 pills of antihistamine a day for at least 2 weeks without control of condition as other options may be available.       Due to recent changes in healthcare laws, you may see results of your pathology and/or laboratory studies on MyChart before the doctors have had a chance to review them. We understand that in some cases there may be results that are confusing or concerning to you. Please understand that not all results are received at the same time and often the doctors may need to interpret multiple results in order to provide you with the best plan of care or course of treatment. Therefore, we ask that you please give us  2 business days to thoroughly review all your results before contacting the office for clarification. Should we see a critical lab result, you will be contacted sooner.   If You Need Anything After Your Visit  If you have any questions or concerns for your doctor, please call our main line at [phone] and press option 4 to reach your doctor's medical assistant. If no one answers, please leave a voicemail as directed and we will return your call as soon as possible. Messages left after 4 pm will be answered the following business day.   You may also send us  a message via MyChart. We typically respond to MyChart messages within 1-2 business days.  For prescription refills, please ask  your pharmacy to contact our office. Our fax number is [phone].  If you have an urgent issue when the clinic is closed that cannot wait until the next business day, you can page your doctor at the number below.    Please note that while we do our best to be available for urgent issues outside of office hours, we are not available 24/7.   If you have an urgent issue and are unable to reach us , you may choose to seek medical care at your doctor's office, retail clinic, urgent care center, or emergency room.  If you have a medical emergency, please immediately call 911 or go to the emergency department.  Pager Numbers  - Dr. Hester: [phone]  - Dr. Jackquline: [phone]  - Dr. Claudene: [phone]   - Dr. Raymund: [phone]  In the event of inclement weather, please call our main line at [phone] for an update on the status of any delays or closures.  Dermatology Medication Tips: Please keep the boxes that topical medications come in in order to help keep track of the instructions about where and how to use these. Pharmacies typically print the medication instructions only on the boxes and not directly on the medication tubes.   If your medication is too expensive, please contact our office at [phone] option 4 or send us  a message through MyChart.   We are unable to tell what your co-pay  for medications will be in advance as this is different depending on your insurance coverage. However, we may be able to find a substitute medication at lower cost or fill out paperwork to get insurance to cover a needed medication.   If a prior authorization is required to get your medication covered by your insurance company, please allow us  1-2 business days to complete this process.  Drug prices often vary depending on where the prescription is filled and some pharmacies may offer cheaper prices.  The website www.goodrx.com contains coupons for medications through different  pharmacies. The prices here do not account for what the cost may be with help from insurance (it may be cheaper with your insurance), but the website can give you the price if you did not use any insurance.  - You can print the associated coupon and take it with your prescription to the pharmacy.  - You may also stop by our office during regular business hours and pick up a GoodRx coupon card.  - If you need your prescription sent electronically to a different pharmacy, notify our office through [facility] or by phone at [phone] option 4.     Si Usted Necesita Algo Despus de Su Visita  Tambin puede enviarnos un mensaje a travs de Clinical cytogeneticist. Por lo general respondemos a los mensajes de MyChart en el transcurso de 1 a 2 das hbiles.  Para renovar recetas, por favor pida a su farmacia que se ponga en contacto con nuestra oficina. Randi lakes de fax es [place] [phone].  Si tiene un asunto urgente cuando la clnica est cerrada y que no puede esperar hasta el siguiente da hbil, puede llamar/localizar a su doctor(a) al nmero que aparece a continuacin.   Por favor, tenga en cuenta que aunque hacemos todo lo posible para estar disponibles para asuntos urgentes fuera del horario de [place], no estamos disponibles las 24 horas del da, los 7 [address]  [address] de la [place].   Si tiene un problema urgente y no puede comunicarse con nosotros, puede optar por buscar atencin mdica  en el consultorio de su doctor(a), en una clnica privada, en un centro de atencin urgente o en una sala de emergencias.  Si tiene Engineer, drilling, por favor llame inmediatamente al 911 o vaya a la sala de emergencias.  Nmeros de bper  - Dr. Hester: [phone]  - Dra. Jackquline: [phone]  - Dr. Claudene: [phone]  - Dra. Kitts: [phone]  En caso de inclemencias del [place], por favor llame a nuestra lnea principal al [phone] para una actualizacin sobre el estado de cualquier retraso o  cierre.  Consejos para la medicacin en dermatologa: Por favor, guarde las cajas en las que vienen los medicamentos de uso tpico para ayudarle a seguir las instrucciones sobre dnde y cmo usarlos. Las farmacias generalmente imprimen las instrucciones del medicamento slo en las cajas y no directamente en los tubos del [place].   Si su medicamento es muy caro, por favor, pngase en contacto con landry rieger llamando al [phone] y presione la opcin 4 o envenos un mensaje a travs de Clinical cytogeneticist.   No podemos decirle cul ser su copago por los medicamentos por adelantado ya que esto es diferente dependiendo de la cobertura de su seguro. Sin embargo, es posible que podamos encontrar un medicamento sustituto a Audiological scientist un formulario para que el seguro cubra el medicamento que se considera necesario.   Si se requiere una autorizacin previa para que su compaa de seguros  cubra su medicamento, por favor permtanos de 1 a 2 das hbiles para completar [address]  Los precios de los medicamentos varan con frecuencia dependiendo del Environmental consultant de dnde se surte la receta y alguna farmacias pueden ofrecer precios ms baratos.  El sitio web www.goodrx.com tiene cupones para medicamentos de Health and safety inspector. Los precios aqu no tienen en cuenta lo que podra costar con la ayuda del seguro (puede ser ms barato con su seguro), pero el sitio web puede darle el precio si no utiliz Tourist information centre manager.  - Puede imprimir el cupn correspondiente y llevarlo con su receta a la farmacia.  - Tambin puede pasar por nuestra oficina durante el horario de atencin regular y Education officer, museum una tarjeta de cupones de GoodRx.  - Si necesita que su receta se enve electrnicamente a una farmacia diferente, informe a nuestra oficina a travs de MyChart de [place] o por telfono llamando al [phone] y presione la opcin 4.

## 2023-11-18 ENCOUNTER — Ambulatory Visit
Admission: RE | Admit: 2023-11-18 | Discharge: 2023-11-18 | Disposition: A | Discharge status: Home / Self Care | Source: Ambulatory Visit | Attending: General Surgery | Admitting: General Surgery

## 2023-11-18 DIAGNOSIS — N6321 Unspecified lump in the left breast, upper outer quadrant: Secondary | ICD-10-CM | POA: Diagnosis present

## 2023-11-18 MED ORDER — GADOBUTROL 1 MMOL/ML IV SOLN
8.0000 mL | Freq: Once | INTRAVENOUS | Status: AC | PRN
Start: 2023-11-18 — End: 2023-11-18
  Administered 2023-11-18: 8 mL via INTRAVENOUS

## 2023-11-20 ENCOUNTER — Other Ambulatory Visit: Payer: Self-pay | Admitting: General Surgery

## 2023-11-20 DIAGNOSIS — R928 Other abnormal and inconclusive findings on diagnostic imaging of breast: Secondary | ICD-10-CM

## 2023-11-24 ENCOUNTER — Ambulatory Visit
Admission: RE | Admit: 2023-11-24 | Discharge: 2023-11-24 | Disposition: A | Discharge status: Home / Self Care | Source: Ambulatory Visit | Attending: General Surgery

## 2023-11-24 ENCOUNTER — Other Ambulatory Visit: Payer: Self-pay | Admitting: General Surgery

## 2023-11-24 ENCOUNTER — Ambulatory Visit
Admission: RE | Admit: 2023-11-24 | Discharge: 2023-11-24 | Disposition: A | Discharge status: Home / Self Care | Source: Ambulatory Visit | Attending: General Surgery | Admitting: General Surgery

## 2023-11-24 DIAGNOSIS — R928 Other abnormal and inconclusive findings on diagnostic imaging of breast: Secondary | ICD-10-CM | POA: Diagnosis present

## 2023-11-26 ENCOUNTER — Other Ambulatory Visit: Payer: Self-pay | Admitting: Pulmonary Disease

## 2023-11-26 DIAGNOSIS — J90 Pleural effusion, not elsewhere classified: Secondary | ICD-10-CM

## 2023-12-01 ENCOUNTER — Ambulatory Visit
Admission: RE | Admit: 2023-12-01 | Discharge: 2023-12-01 | Disposition: A | Discharge status: Home / Self Care | Source: Ambulatory Visit | Attending: Pulmonary Disease | Admitting: Pulmonary Disease

## 2023-12-01 DIAGNOSIS — J90 Pleural effusion, not elsewhere classified: Secondary | ICD-10-CM | POA: Insufficient documentation

## 2023-12-02 ENCOUNTER — Encounter: Payer: Self-pay | Admitting: Dermatology

## 2023-12-02 ENCOUNTER — Ambulatory Visit: Admitting: Dermatology

## 2023-12-02 DIAGNOSIS — Z79899 Other long term (current) drug therapy: Secondary | ICD-10-CM

## 2023-12-02 DIAGNOSIS — Z7189 Other specified counseling: Secondary | ICD-10-CM

## 2023-12-02 DIAGNOSIS — L509 Urticaria, unspecified: Secondary | ICD-10-CM

## 2023-12-02 DIAGNOSIS — L501 Idiopathic urticaria: Secondary | ICD-10-CM

## 2023-12-02 MED ORDER — DUPILUMAB 300 MG/2ML ~~LOC~~ SOAJ
300.0000 mg | Freq: Once | SUBCUTANEOUS | Status: AC
  Administered 2023-12-02: 300 mg via SUBCUTANEOUS

## 2023-12-02 MED ORDER — DUPIXENT 300 MG/2ML ~~LOC~~ SOAJ: 300.0000 mg | SUBCUTANEOUS | 4 refills | Status: AC

## 2023-12-02 NOTE — Patient Instructions (Addendum)
 For Hives (urticaria); dermatographism or Itch: Start non sedating antihistamine (either Allegra 180mg , or Claritin  10mg , or Zyrtec 10mg ) daily.  All these are non-prescription (Over the Counter).   Start out with 1 pill a day.   After a week if not improving may increase to 2 pills a day.   After another week if not improving may increase to 3 pills a day.   After another week if still not improving may take up to 4 pills a day. Stay at highest dose that keeps condition controlled, but only up to 4 pills a day. Stay at the controlling dose for at least 2 weeks. Contact office if taking 4 pills of antihistamine a day for at least 2 weeks without control of condition as other options may be available.       Due to recent changes in healthcare laws, you may see results of your pathology and/or laboratory studies on MyChart before the doctors have had a chance to review them. We understand that in some cases there may be results that are confusing or concerning to you. Please understand that not all results are received at the same time and often the doctors may need to interpret multiple results in order to provide you with the best plan of care or course of treatment. Therefore, we ask that you please give us  2 business days to thoroughly review all your results before contacting the office for clarification. Should we see a critical lab result, you will be contacted sooner.   If You Need Anything After Your Visit  If you have any questions or concerns for your doctor, please call our main line at [phone] and press option 4 to reach your doctor's medical assistant. If no one answers, please leave a voicemail as directed and we will return your call as soon as possible. Messages left after 4 pm will be answered the following business day.   You may also send us  a message via MyChart. We typically respond to MyChart messages within 1-2 business days.  For prescription refills, please ask  your pharmacy to contact our office. Our fax number is [phone].  If you have an urgent issue when the clinic is closed that cannot wait until the next business day, you can page your doctor at the number below.    Please note that while we do our best to be available for urgent issues outside of office hours, we are not available 24/7.   If you have an urgent issue and are unable to reach us , you may choose to seek medical care at your doctor's office, retail clinic, urgent care center, or emergency room.  If you have a medical emergency, please immediately call 911 or go to the emergency department.  Pager Numbers  - Dr. Hester: [phone]  - Dr. Jackquline: [phone]  - Dr. Claudene: [phone]   - Dr. Raymund: [phone]  In the event of inclement weather, please call our main line at [phone] for an update on the status of any delays or closures.  Dermatology Medication Tips: Please keep the boxes that topical medications come in in order to help keep track of the instructions about where and how to use these. Pharmacies typically print the medication instructions only on the boxes and not directly on the medication tubes.   If your medication is too expensive, please contact our office at [phone] option 4 or send us  a message through MyChart.   We are unable to tell what your co-pay  for medications will be in advance as this is different depending on your insurance coverage. However, we may be able to find a substitute medication at lower cost or fill out paperwork to get insurance to cover a needed medication.   If a prior authorization is required to get your medication covered by your insurance company, please allow us  1-2 business days to complete this process.  Drug prices often vary depending on where the prescription is filled and some pharmacies may offer cheaper prices.  The website www.goodrx.com contains coupons for medications through different  pharmacies. The prices here do not account for what the cost may be with help from insurance (it may be cheaper with your insurance), but the website can give you the price if you did not use any insurance.  - You can print the associated coupon and take it with your prescription to the pharmacy.  - You may also stop by our office during regular business hours and pick up a GoodRx coupon card.  - If you need your prescription sent electronically to a different pharmacy, notify our office through [facility] or by phone at [phone] option 4.     Si Usted Necesita Algo Despus de Su Visita  Tambin puede enviarnos un mensaje a travs de Clinical cytogeneticist. Por lo general respondemos a los mensajes de MyChart en el transcurso de 1 a 2 das hbiles.  Para renovar recetas, por favor pida a su farmacia que se ponga en contacto con nuestra oficina. Randi lakes de fax es [place] [phone].  Si tiene un asunto urgente cuando la clnica est cerrada y que no puede esperar hasta el siguiente da hbil, puede llamar/localizar a su doctor(a) al nmero que aparece a continuacin.   Por favor, tenga en cuenta que aunque hacemos todo lo posible para estar disponibles para asuntos urgentes fuera del horario de [place], no estamos disponibles las 24 horas del da, los 7 [address]  [address] de la [place].   Si tiene un problema urgente y no puede comunicarse con nosotros, puede optar por buscar atencin mdica  en el consultorio de su doctor(a), en una clnica privada, en un centro de atencin urgente o en una sala de emergencias.  Si tiene Engineer, drilling, por favor llame inmediatamente al 911 o vaya a la sala de emergencias.  Nmeros de bper  - Dr. Hester: [phone]  - Dra. Jackquline: [phone]  - Dr. Claudene: [phone]  - Dra. Kitts: [phone]  En caso de inclemencias del [place], por favor llame a nuestra lnea principal al [phone] para una actualizacin sobre el estado de cualquier retraso o  cierre.  Consejos para la medicacin en dermatologa: Por favor, guarde las cajas en las que vienen los medicamentos de uso tpico para ayudarle a seguir las instrucciones sobre dnde y cmo usarlos. Las farmacias generalmente imprimen las instrucciones del medicamento slo en las cajas y no directamente en los tubos del [place].   Si su medicamento es muy caro, por favor, pngase en contacto con landry rieger llamando al [phone] y presione la opcin 4 o envenos un mensaje a travs de Clinical cytogeneticist.   No podemos decirle cul ser su copago por los medicamentos por adelantado ya que esto es diferente dependiendo de la cobertura de su seguro. Sin embargo, es posible que podamos encontrar un medicamento sustituto a Audiological scientist un formulario para que el seguro cubra el medicamento que se considera necesario.   Si se requiere una autorizacin previa para que su compaa de seguros  cubra su medicamento, por favor permtanos de 1 a 2 das hbiles para completar [address]  Los precios de los medicamentos varan con frecuencia dependiendo del Environmental consultant de dnde se surte la receta y alguna farmacias pueden ofrecer precios ms baratos.  El sitio web www.goodrx.com tiene cupones para medicamentos de Health and safety inspector. Los precios aqu no tienen en cuenta lo que podra costar con la ayuda del seguro (puede ser ms barato con su seguro), pero el sitio web puede darle el precio si no utiliz Tourist information centre manager.  - Puede imprimir el cupn correspondiente y llevarlo con su receta a la farmacia.  - Tambin puede pasar por nuestra oficina durante el horario de atencin regular y Education officer, museum una tarjeta de cupones de GoodRx.  - Si necesita que su receta se enve electrnicamente a una farmacia diferente, informe a nuestra oficina a travs de MyChart de [place] o por telfono llamando al [phone] y presione la opcin 4.

## 2023-12-02 NOTE — Progress Notes (Signed)
   Follow-Up Visit   Subjective  Mariah Hogan is a 73 y.o. female who presents for the following: Urticaria with pruritus, backs, arms, 2 wk f/u, Dupixent , Zyrtec bid, Patient has been put back on Prednisone  10mg  1 po x 10 days and started 11/27/23 for what they thought was fluid in the lungs but MRI showed just scar tissue in lungs, patient did have some improvement with Dupixent  but has improved more since starting Prednisone , itching on scalp today  The following portions of the chart were reviewed this encounter and updated as appropriate: medications, allergies, medical history  Review of Systems:  No other skin or systemic complaints except as noted in HPI or Assessment and Plan.  Objective  Well appearing patient in no apparent distress; mood and affect are within normal limits.  A focused examination was performed of the following areas: Back, arms  Relevant exam findings are noted in the Assessment and Plan.    Assessment & Plan   URTICARIA with PRURITUS Chronic idiopathic urticaria   Vs urticarial medication reaction from Lyrica (pt is off Lyrica) Started Dupixent  11/16/23 Back, scalp Exam: mild erythema back  Chronic and persistent condition with duration or expected duration over one year. Condition is improving with treatment but not currently at goal. Urticaria or hives is a pink to red patchy whelp- like rash of the skin that typically itches and it is the result of histamine release in the skin.   Hives may have multiple causes including stress, medications, infections, and systemic illness.  Sometimes there is a family history of chronic urticaria.   Physical urticarias may be caused by pressure (dermatographism), heat, sun, cold, vibration.  Insect bites can cause papular urticaria. It is often difficult to find the cause of generalized hives.  Statistically, 70% of the time a cause of generalized hives is not found.  Sometimes hives can spontaneously  resolve. Other times hives can persist and when it does, and no cause is found, and it has been at least 6 weeks since started, it is called chronic idiopathic urticaria. Antihistamines are the mainstay for treatment.  In severe cases Xolair injections may be used.   Treatment Plan: Cont Zyrtec 1 po bid Cont Dupixent  300mg /73ml sq injections q 2 wks Dupixent  300mg /54ml sq injections today to L ant thigh, sample x 1 Lot [id number], 09/06/2025  URTICARIA   Related Medications Dupilumab  SOAJ 300 mg   Return in about 2 weeks (around 12/16/2023) for Urticaria Dupixent  injection.  I, Grayce Saunas, RMA, am acting as scribe for Alm Rhyme, MD .   Documentation: I have reviewed the above documentation for accuracy and completeness, and I agree with the above.  Alm Rhyme, MD

## 2023-12-03 ENCOUNTER — Ambulatory Visit: Attending: Urology | Admitting: Physical Therapy

## 2023-12-03 DIAGNOSIS — R2689 Other abnormalities of gait and mobility: Secondary | ICD-10-CM | POA: Diagnosis present

## 2023-12-03 DIAGNOSIS — M533 Sacrococcygeal disorders, not elsewhere classified: Secondary | ICD-10-CM | POA: Insufficient documentation

## 2023-12-03 DIAGNOSIS — M25611 Stiffness of right shoulder, not elsewhere classified: Secondary | ICD-10-CM | POA: Insufficient documentation

## 2023-12-03 DIAGNOSIS — N3946 Mixed incontinence: Secondary | ICD-10-CM | POA: Diagnosis not present

## 2023-12-03 DIAGNOSIS — M25562 Pain in left knee: Secondary | ICD-10-CM | POA: Diagnosis present

## 2023-12-03 DIAGNOSIS — G8929 Other chronic pain: Secondary | ICD-10-CM | POA: Diagnosis present

## 2023-12-03 NOTE — Patient Instructions (Addendum)
  Proper body mechanics with getting out of a chair to decrease strain  on back &pelvic floor   Avoid holding your breath when Getting out of the chair:  Scoot to front part of chair chair Heels behind knees, feet are hip width apart, nose over toes  Inhale like you are smelling roses Exhale to stand   __  Sitting with knees hip width apart not close together   __  Read article on association between nocturia and Sleep apnea ( OSA)  . Communicate with pulmonologist on getting sleep study to rule in and out sleep apnea

## 2023-12-03 NOTE — Therapy (Signed)
 OUTPATIENT PHYSICAL THERAPY EVALUATION   Patient Name: Mariah Hogan MRN: [id number] DOB:[birth year], 73 y.o., female Today's Date: 12/03/2023   PT End of Session - 12/03/23 1341     Visit Number 1    Number of Visits 10    Date for Recertification  02/11/24    PT Start Time 1335    PT Stop Time 1415    PT Time Calculation (min) 40 min    Activity Tolerance Patient tolerated treatment well;No increased pain    Behavior During Therapy WFL for tasks assessed/performed          Past Medical History:  Diagnosis Date   Anemia    Arthritis    Asthma    H/O YEARS AGO-NOW HAS COUGHING SPELLS WHICH IS WHY SHE HAS HER ALBUTEROL  INHALER   Basal cell carcinoma    R nose ([facility])   Basal cell carcinoma 01/29/2016   Right forehead above med brow   Basal cell carcinoma 10/12/2019   L nasal tip - [facility] 05/15/2020   BRCA gene mutation negative 08/12/2017   Variant of unknown significance at APC only abnormality.   Breast cancer (HCC) 08/04/2017   1.8 cm ER 90%, PR 20%, HER-2/neu negative invasive mammary carcinoma of the left upper outer quadrant.  MammaPrint: High risk.   Cancer (HCC)    [phone] basal cell carcinoma   Complication of anesthesia    HARD TIME WAKING UP   Dysplastic nevus 02/21/2010   Right mid pretibial, mild   Dysrhythmia    TACHYCARDIA   GERD (gastroesophageal reflux disease)    History of hiatal hernia    SMALL   Hyperlipidemia    Hypertension    PT STATES IN 01-18-18 PREOP PHONE INTERVIEW THAT HER BP HAS BEEN ELEVATED SINCE STARTING ON VALSARTAN- PCP SWITCHED PT BACK TO MAXIDE ON 01-18-18 AND PT STATES THAT SHE HAS LOST 6-10 POUNDS OF FLUID SINCE RESTARTING MAXIDE.     Personal history of chemotherapy    PONV (postoperative nausea and vomiting)    Sjogren's syndrome 07/2020   Past Surgical History:  Procedure Laterality Date   ABDOMINAL HYSTERECTOMY  1998   BIOPSY  03/27/2023   Procedure: BIOPSY;  Surgeon: Maryruth Ole DASEN, MD;  Location: [facility]  ENDOSCOPY;  Service: Endoscopy;;   bladder tack  [phone]   BREAST BIOPSY Left 1990   BREAST BIOPSY Left 2018   core bx- neg   BREAST BIOPSY Left 08/04/2017   left UOQ 2 oclock INVASIVE DUCTAL CARCINOMA.   BREAST BIOPSY Right 08/2017   MRI biopsy, FIBROCYSTIC CHANGE AND USUAL DUCTAL HYPERPLASIA,, clip placed   BREAST BIOPSY Right 11/11/2023   MM RT BREAST BX W LOC DEV 1ST LESION IMAGE BX SPEC STEREO GUIDE 11/11/2023 [facility]   BREAST CYST EXCISION  x3   BREAST EXCISIONAL BIOPSY Bilateral    BREAST RECONSTRUCTION WITH PLACEMENT OF TISSUE EXPANDER AND FLEX HD (ACELLULAR HYDRATED DERMIS) Left 01/27/2018   Procedure: BREAST RECONSTRUCTION WITH PLACEMENT OF TISSUE EXPANDER AND FLEX HD (ACELLULAR HYDRATED DERMIS);  Surgeon: Lowery Estefana RAMAN, DO;  Location: [facility] ORS;  Service: Plastics;  Laterality: Left;   BREAST SURGERY Left 02/13/14   Fibrocystic changes, pseudo-angiomatous stromal hyperplasia. No atypia or malignancy.   CARPAL TUNNEL RELEASE Bilateral x2   CHOLECYSTECTOMY  2008   COLONOSCOPY WITH PROPOFOL  N/A 01/12/2015   Procedure: COLONOSCOPY WITH PROPOFOL ;  Surgeon: Lamar DASEN Holmes, MD;  Location: [facility] ENDOSCOPY;  Service: Endoscopy;  Laterality: N/A;   COLONOSCOPY WITH PROPOFOL  N/A 02/06/2020   Procedure:  COLONOSCOPY WITH PROPOFOL ;  Surgeon: Maryruth Ole DASEN, MD;  Location: [facility] ENDOSCOPY;  Service: Endoscopy;  Laterality: N/A;   ESOPHAGEAL DILATION  03/27/2023   Procedure: ESOPHAGEAL DILATION;  Surgeon: Maryruth Ole DASEN, MD;  Location: [facility] ENDOSCOPY;  Service: Endoscopy;;   ESOPHAGOGASTRODUODENOSCOPY (EGD) WITH PROPOFOL  N/A 01/12/2015   Procedure: ESOPHAGOGASTRODUODENOSCOPY (EGD) WITH PROPOFOL ;  Surgeon: Lamar DASEN Holmes, MD;  Location: [facility] ENDOSCOPY;  Service: Endoscopy;  Laterality: N/A;   ESOPHAGOGASTRODUODENOSCOPY (EGD) WITH PROPOFOL  N/A 02/06/2020   Procedure: ESOPHAGOGASTRODUODENOSCOPY (EGD) WITH PROPOFOL ;  Surgeon: Maryruth Ole DASEN, MD;  Location: [facility] ENDOSCOPY;   Service: Endoscopy;  Laterality: N/A;   ESOPHAGOGASTRODUODENOSCOPY (EGD) WITH PROPOFOL  N/A 03/27/2023   Procedure: ESOPHAGOGASTRODUODENOSCOPY (EGD) WITH PROPOFOL ;  Surgeon: Maryruth Ole DASEN, MD;  Location: [facility] ENDOSCOPY;  Service: Endoscopy;  Laterality: N/A;   LIPOSUCTION WITH LIPOFILLING Left 12/23/2018   Procedure: LIPOSUCTION WITH LIPOFILLING FROM ABDOMEN TO LEFT BREAST;  Surgeon: Lowery Estefana RAMAN, DO;  Location: [facility];  Service: Plastics;  Laterality: Left;  90 min, please   MASTECTOMY Left 2019   [facility]   MASTECTOMY W/ SENTINEL NODE BIOPSY Left 01/27/2018   ypT1c ypN0; ER 90%, PR 20%, HER-2/neu not overexpressed.  MASTECTOMY WITH SENTINEL LYMPH NODE BIOPSY;  Surgeon: Dessa Reyes ORN, MD;  Location: [facility] ORS;  Service: General;  Laterality: Left;   MINOR BREAST BIOPSY Right 09/02/2017   Radiology perform procedure, fibrocystic changes right retroareolar area.   NASAL RECONSTRUCTION  1983   OOPHORECTOMY     POLYPECTOMY  03/27/2023   Procedure: POLYPECTOMY;  Surgeon: Maryruth Ole DASEN, MD;  Location: [facility] ENDOSCOPY;  Service: Endoscopy;;   PORT-A-CATH REMOVAL     PORTACATH PLACEMENT N/A 10/21/2017   Procedure: INSERTION PORT-A-CATH;  Surgeon: Dessa Reyes ORN, MD;  Location: [facility] ORS;  Service: General;  Laterality: N/A;   REMOVAL OF TISSUE EXPANDER AND PLACEMENT OF IMPLANT Left 05/13/2018   Procedure: REMOVAL OF TISSUE EXPANDER AND PLACEMENT OF IMPLANT;  Surgeon: Lowery Estefana RAMAN, DO;  Location: [facility] ORS;  Service: Plastics;  Laterality: Left;  total surgery time should be 3 hours, per provider   REVERSE SHOULDER ARTHROPLASTY Right 11/27/2020   Procedure: REVERSE SHOULDER ARTHROPLASTY;  Surgeon: Edie Norleen PARAS, MD;  Location: [facility] ORS;  Service: Orthopedics;  Laterality: Right;   SAVORY DILATION N/A 01/12/2015   Procedure: SAVORY DILATION;  Surgeon: Lamar DASEN Holmes, MD;  Location: [facility] ENDOSCOPY;  Service: Endoscopy;  Laterality: N/A;   TONSILLECTOMY AND  ADENOIDECTOMY  1956   Patient Active Problem List   Diagnosis Date Noted   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   Status post reverse arthroplasty of shoulder, right 11/27/2020   Carcinoma of overlapping sites of left breast in female, estrogen receptor positive (HCC) 07/30/2020   Personal history of other malignant neoplasm of skin 11/17/2019   Complex tear of medial meniscus of left knee as current injury 08/10/2019   Primary osteoarthritis of left knee 08/10/2019   S/P breast reconstruction, left 04/15/2019   Acquired absence of left breast 12/23/2018   Breast asymmetry following reconstructive surgery 08/31/2018   Acquired absence of breast 02/09/2018   S/P mastectomy, left 02/02/2018   Breast cancer (HCC) 01/27/2018   Thrush 11/05/2017   Goals of care, counseling/discussion 10/09/2017   Hypokalemia 10/09/2017   Acute renal failure (ARF) 09/11/2017   Diarrhea 09/11/2017   Encounter for antineoplastic chemotherapy 09/03/2017   Nodule of upper lobe of right lung 08/24/2017   Osteopenia 08/24/2017   Malignant neoplasm of left female breast (HCC) 08/07/2017  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   Incomplete emptying of bladder 09/03/2016   SUI (stress urinary incontinence, female) 09/03/2016   Mass of right breast 02/14/2014   Mass of upper outer quadrant of left breast 01/20/2014   Arthritis 12/28/2013   Asthma without status asthmaticus 12/28/2013   Esophageal reflux 12/28/2013   Hyperlipidemia 12/28/2013   Hypertension 05/09/2003    PCP: [facility]   REFERRING PROVIDER: Stoiff   REFERRING DIAG: Overactive bladder with urge incontinence/stress incontinence for pelvic floor PT   Rationale for Evaluation and Treatment Rehabilitation  THERAPY DIAG:  Sacrococcygeal disorders, not elsewhere classified  Other abnormalities of gait and mobility  Chronic pain of left knee  Stiffness of right shoulder, not elsewhere classified  ONSET DATE:    SUBJECTIVE:                                                                                                                                                                                           SUBJECTIVE STATEMENT: 1) Urinary Leakage:  pt doe snot wear pads. Pt was going through 5-6 pads before she started urinary medication.  Now pt has urgency. Pt has leakage with bending up yard work .  Nocturia 2-3 x / night    2) L knee pain - Pt was suppose to get L TKA but then she held it off because she had get R shoulder surgery. Pt used to walk 3 miles across 4-5 x a week but she has stopped due to pain. 5/10 level  with going up stairs,  getting into SUVs, bending    3) R shoulder stiffness:  Pt had total shoulder reverse placement and underwent PT. Pt is still limited in not raising as high and behind her back like she used to. Pt has dropped a few things. Wiping bowel movements are also affected by her limitation with reaching hand behind back    PERTINENT HISTORY:  L mastectomy with breat implant for breast CA R shoulder surgery  Gall bladder removal  3 Bladder slings surgeries Hysterectomy Basal cell cancer    PAIN:  Are you having pain? Yes: see above  PRECAUTIONS: No  WEIGHT BEARING RESTRICTIONS: No   FALLS:  Has patient fallen in last 6 months? No   LIVING ENVIRONMENT: Lives with: alone  Lives in: one story  Stairs: 4 STE with rail   OCCUPATION: retired Psychologist, occupational ,    PLOF: IND   PATIENT GOALS:   Improve leakage, R shoulder ROM, and decrease L knee pain and return walking routine     OBJECTIVE:   [facility] PT Assessment - 12/03/23 1402  Other:   Functional  reach hand behind back: R thumb at PSIS level, L T 5 level   shoulder flexion seated: 120 deg R, 160 deg L      Posture/Postural Control   Posture Comments seated with knees adducted,   rounded shoulders      AROM   Overall AROM Comments shoulder flexion seated: 120 deg R, 160 deg L       Strength   Overall Strength Comments R hip flex 3+/5, L 4/5, B knee flexion 4/5 ,  L knee ext 3+/5, R 4/5      Palpation   SI assessment  L shoulder, L iliac crest lowered      Ambulation/Gait   Gait Comments decreased R stance, limited posterior rotation of R pelvis , 1.04 m/s              Self-Care   Self-Care Other Self-Care Comments    Other Self-Care Comments  explained regional interdependent approach to address pain at different body parts, explained upcoming visits to realign posture and spine.pelvis to improve Sx and achieve goals, showed anatomy images of deep core system ,      Therapeutic Activites    Therapeutic Activities Other Therapeutic Activities    Other Therapeutic Activities inquired about meaningful tasks and role of learning proper body mechanics, future sessions to help realign pelvis and spine and to learn co-activation of deep core system when performing against gravity tasks . These improvements will help address pain and Sx     Neuro Re-ed    Neuro Re-ed Details  cued for alignment and proprioception of pelvis and LKC and spine in  sit to stand and for proper sitting posture to activate deep core system           HOME EXERCISE PROGRAM: See pt instruction section    ASSESSMENT:  CLINICAL IMPRESSION: Pt is a   yo  who presents with  following issues which impact QOL, ADL,  fitness, social and community activities:    1) Urinary Leakage / Nocturia/ urgency  2) L knee pain 3) R shoulder stiffness  Pt's musculoskeletal assessment revealed uneven pelvic girdle and shoulder height, asymmetries to gait pattern, limited spinal /pelvic mobility, dyscoordination and strength of pelvic floor mm, hip weakness, poor body mechanics which places strain on the abdominal/pelvic floor mm. These are deficits that indicate an ineffective intraabdominal pressure system associated with increased risk for pt's Sx.     Pt will benefit from propioception/  coordination/ body mechanics training and education with gravity-loaded tasks at work and home and  fitness modifications in order to gain a more effective intraabdominal pressure system to minimize Sx.   Pt was provided education on etiology of Sx with anatomy, physiology explanation with images along with the benefits of customized pelvic PT Tx based on pt's medical conditions and musculoskeletal deficits.  Explained the physiology of deep core mm coordination and roles of pelvic floor function in urination, defecation, sexual function, and postural control with deep core mm system, and the role of posture and alignment to help pelvic issues.    Regional interdependent approaches will yield greater benefits in pt's POC due to the complexity of pt's medical Hx   Following Tx today which pt tolerated without complaints,  pt demo'd proper body mechanics to minimize straining pelvic floor.  Plan to address realignment of spine/ pelvis at next session to help promote optimize IAP system for improved pelvic floor function, trunk stability, gait, balance, stabilization  with mobility tasks.  Plan to address pelvic floor issues once pelvis and spine are realigned to yield better outcomes.                                                       Pt benefits from skilled PT.    OBJECTIVE IMPAIRMENTS decreased activity tolerance, decreased coordination, decreased endurance, decreased mobility, difficulty walking, decreased ROM, decreased strength, decreased safety awareness, hypomobility, increased muscle spasms, impaired flexibility, improper body mechanics, postural dysfunction, and pain, scar restrictions   ACTIVITY LIMITATIONS  self-care, home chores, community activities    PARTICIPATION LIMITATIONS:  community,  walking activities    PERSONAL FACTORS   affecting patient's functional outcome:    REHAB POTENTIAL: Good   CLINICAL DECISION MAKING: Evolving/moderate complexity   EVALUATION COMPLEXITY:  Moderate    PATIENT EDUCATION:    Education details: Showed pt anatomy images. Explained muscles attachments/ connection, physiology of deep core system/ spinal- thoracic-pelvis-lower kinetic chain as they relate to pt's presentation, Sx, and past Hx. Explained what and how these areas of deficits need to be restored to balance and function    See Therapeutic activity / neuromuscular re-education section  Answered pt's questions.   Person educated: Patient Education method: Explanation, Demonstration, Tactile cues, Verbal cues, and Handouts Education comprehension: verbalized understanding, returned demonstration, verbal cues required, tactile cues required, and needs further education     PLAN: PT FREQUENCY: 1x/week   PT DURATION: 10 weeks   PLANNED INTERVENTIONS:   Gait training;Stair training;Functional mobility training;DME Instruction;Therapeutic activities;Therapeutic exercise;Balance training;Neuromuscular re-education;Patient/family education;Vestibular;Visual/perceptual remediation/compensation;Passive range of motion;Moist Heat;Cryotherapy;Traction;Canalith Repostioning;Joint Manipulations;Manual lymph drainage;Manual techniques;Scar mobilization;Energy conservation;Dry needling;ADLs/Self Care Home Management;Biofeedback;Electrical Stimulation;Taping    PLAN FOR NEXT SESSION: See clinical impression for plan     GOALS: Goals reviewed with patient? Yes  SHORT TERM GOALS: Target date: 12/31/2023    Pt will demo IND with HEP                    Baseline: Not IND            Goal status: INITIAL   LONG TERM GOALS: Target date: 02/11/2024    1.Pt will demo proper deep core coordination without chest breathing and optimal excursion of diaphragm/pelvic floor in order to  improve urgency and leakage with bending up yard work . Baseline: dyscoordination Goal status: INITIAL  2.  Pt will demo proper body mechanics in against gravity tasks and ADLs  work tasks, fitness   to minimize straining pelvic floor / back    Baseline: not IND, improper form that places strain on pelvic floor  Goal status: INITIAL    3. Pt will demo increased gait speed > 1.3 m/s with reciprocal gait pattern, longer stride length  in order to ambulate safely in community and build back up  to regular walking routine of 3 miles - 4-5 x a week  Baseline: decreased R stance, limited posterior rotation of R pelvis , 1.04 m/s  Goal status: INITIAL    4. Pt will demo levelled pelvic girdle and shoulder height in order to progress to deep core strengthening HEP and restore mobility at spine, pelvis, gait, posture minimize falls, and improve balance  Baseline: L shoulder and L iliac crest lowered  Goal status: INITIAL   5. Pt will improve PFDI-7 questionnaire to  pts  score change  to demo improved QOL  Baseline:    ( greater pts indicate greater negative impact on QOL)     pts  ( total)    pts  ( UIQ-7 )    pts  ( CRAIQ-7 )    pts  ( POPIQ-7 )  Baseline:  Goal status: INITIAL  6. Pt will improve AROM with reach behind back ( R thumb above level of L5) to be able to reeach behind to clean after BMs and shoulder flexion > 130 deg ) for reaching above cabinets  Baseline:  reach hand behind back: R thumb at PSIS level, L T 5 level   shoulder flexion seated: 120 deg R, 160 deg L   Wiping bowel movements are also affected by her limitation with reaching hand behind back    Reaching up to cabinets   Goal Status: INITIAL:    7.  Pt will communicate with pulmonologist for sleep study to rule in / out OSA   Baseline:      Nocturia 2-3 x / night  Goal Status: INITIAL:   Pia Lupe Plump, PT 12/03/2023, 1:58 PM

## 2023-12-10 ENCOUNTER — Ambulatory Visit: Attending: Urology | Admitting: Physical Therapy

## 2023-12-10 DIAGNOSIS — M25562 Pain in left knee: Secondary | ICD-10-CM | POA: Insufficient documentation

## 2023-12-10 DIAGNOSIS — M533 Sacrococcygeal disorders, not elsewhere classified: Secondary | ICD-10-CM | POA: Diagnosis present

## 2023-12-10 DIAGNOSIS — G8929 Other chronic pain: Secondary | ICD-10-CM | POA: Insufficient documentation

## 2023-12-10 DIAGNOSIS — R2689 Other abnormalities of gait and mobility: Secondary | ICD-10-CM | POA: Insufficient documentation

## 2023-12-10 DIAGNOSIS — M25611 Stiffness of right shoulder, not elsewhere classified: Secondary | ICD-10-CM | POA: Diagnosis present

## 2023-12-10 NOTE — Patient Instructions (Addendum)
 Stretches :   Neck / shoulder stretches:    Lying on back - small sushi roll towel under neck  _ 6 directions   Chin up, down Rotation like changing lanes when driving Ear to shoulder like puppy dog   10 reps    _angel wings, lower elbows down , keep arms touching bed  10 reps    ____________   Avoid straining pelvic floor, abdominal muscles , spine  Use log rolling technique instead of getting out of bed with your neck or the sit-up     Log rolling into and out of bed   Log rolling into and out of bed If getting out of bed on R side, Bent knees, scoot hips/ shoulder to L  Raise R arm completely overhead, rolling onto armpit  Then lower bent knees to bed to get into complete side lying position  Then drop legs off bed, and push up onto R elbow/forearm, and use L hand to push onto the bed    Dig elbows and feet to lift hte buttocks and scoot without lifting head

## 2023-12-10 NOTE — Therapy (Signed)
 OUTPATIENT PHYSICAL THERAPY TREATMENT    Patient Name: Mariah Hogan MRN: [id number] DOB:Oct 06, 1950, 73 y.o., female Today's Date: 12/10/2023   PT End of Session - 12/10/23 1338     Visit Number 2    Number of Visits 10    Date for Recertification  02/11/24    PT Start Time 1335    PT Stop Time 1415    PT Time Calculation (min) 40 min    Activity Tolerance Patient tolerated treatment well;No increased pain    Behavior During Therapy WFL for tasks assessed/performed          Past Medical History:  Diagnosis Date   Anemia    Arthritis    Asthma    H/O YEARS AGO-NOW HAS COUGHING SPELLS WHICH IS WHY SHE HAS HER ALBUTEROL  INHALER   Basal cell carcinoma    R nose ([facility])   Basal cell carcinoma 01/29/2016   Right forehead above med brow   Basal cell carcinoma 10/12/2019   L nasal tip - MOHS 05/15/2020   BRCA gene mutation negative 08/12/2017   Variant of unknown significance at APC only abnormality.   Breast cancer (HCC) 08/04/2017   1.8 cm ER 90%, PR 20%, HER-2/neu negative invasive mammary carcinoma of the left upper outer quadrant.  MammaPrint: High risk.   Cancer (HCC)    [phone] basal cell carcinoma   Complication of anesthesia    HARD TIME WAKING UP   Dysplastic nevus 02/21/2010   Right mid pretibial, mild   Dysrhythmia    TACHYCARDIA   GERD (gastroesophageal reflux disease)    History of hiatal hernia    SMALL   Hyperlipidemia    Hypertension    PT STATES IN 01-18-18 PREOP PHONE INTERVIEW THAT HER BP HAS BEEN ELEVATED SINCE STARTING ON VALSARTAN- PCP SWITCHED PT BACK TO MAXIDE ON 01-18-18 AND PT STATES THAT SHE HAS LOST 6-10 POUNDS OF FLUID SINCE RESTARTING MAXIDE.     Personal history of chemotherapy    PONV (postoperative nausea and vomiting)    Sjogren's syndrome 07/2020   Past Surgical History:  Procedure Laterality Date   ABDOMINAL HYSTERECTOMY  1998   BIOPSY  03/27/2023   Procedure: BIOPSY;  Surgeon: Maryruth Ole DASEN, MD;  Location: [facility]  ENDOSCOPY;  Service: Endoscopy;;   bladder tack  [phone]   BREAST BIOPSY Left 1990   BREAST BIOPSY Left 2018   core bx- neg   BREAST BIOPSY Left 08/04/2017   left UOQ 2 oclock INVASIVE DUCTAL CARCINOMA.   BREAST BIOPSY Right 08/2017   MRI biopsy, FIBROCYSTIC CHANGE AND USUAL DUCTAL HYPERPLASIA,, clip placed   BREAST BIOPSY Right 11/11/2023   MM RT BREAST BX W LOC DEV 1ST LESION IMAGE BX SPEC STEREO GUIDE 11/11/2023 [facility]   BREAST CYST EXCISION  x3   BREAST EXCISIONAL BIOPSY Bilateral    BREAST RECONSTRUCTION WITH PLACEMENT OF TISSUE EXPANDER AND FLEX HD (ACELLULAR HYDRATED DERMIS) Left 01/27/2018   Procedure: BREAST RECONSTRUCTION WITH PLACEMENT OF TISSUE EXPANDER AND FLEX HD (ACELLULAR HYDRATED DERMIS);  Surgeon: Lowery Estefana RAMAN, DO;  Location: [facility] ORS;  Service: Plastics;  Laterality: Left;   BREAST SURGERY Left 02/13/14   Fibrocystic changes, pseudo-angiomatous stromal hyperplasia. No atypia or malignancy.   CARPAL TUNNEL RELEASE Bilateral x2   CHOLECYSTECTOMY  2008   COLONOSCOPY WITH PROPOFOL  N/A 01/12/2015   Procedure: COLONOSCOPY WITH PROPOFOL ;  Surgeon: Lamar DASEN Holmes, MD;  Location: [facility] ENDOSCOPY;  Service: Endoscopy;  Laterality: N/A;   COLONOSCOPY WITH PROPOFOL  N/A 02/06/2020  Procedure: COLONOSCOPY WITH PROPOFOL ;  Surgeon: Maryruth Ole DASEN, MD;  Location: [facility] ENDOSCOPY;  Service: Endoscopy;  Laterality: N/A;   ESOPHAGEAL DILATION  03/27/2023   Procedure: ESOPHAGEAL DILATION;  Surgeon: Maryruth Ole DASEN, MD;  Location: [facility] ENDOSCOPY;  Service: Endoscopy;;   ESOPHAGOGASTRODUODENOSCOPY (EGD) WITH PROPOFOL  N/A 01/12/2015   Procedure: ESOPHAGOGASTRODUODENOSCOPY (EGD) WITH PROPOFOL ;  Surgeon: Lamar DASEN Holmes, MD;  Location: [facility] ENDOSCOPY;  Service: Endoscopy;  Laterality: N/A;   ESOPHAGOGASTRODUODENOSCOPY (EGD) WITH PROPOFOL  N/A 02/06/2020   Procedure: ESOPHAGOGASTRODUODENOSCOPY (EGD) WITH PROPOFOL ;  Surgeon: Maryruth Ole DASEN, MD;  Location: [facility] ENDOSCOPY;   Service: Endoscopy;  Laterality: N/A;   ESOPHAGOGASTRODUODENOSCOPY (EGD) WITH PROPOFOL  N/A 03/27/2023   Procedure: ESOPHAGOGASTRODUODENOSCOPY (EGD) WITH PROPOFOL ;  Surgeon: Maryruth Ole DASEN, MD;  Location: [facility] ENDOSCOPY;  Service: Endoscopy;  Laterality: N/A;   LIPOSUCTION WITH LIPOFILLING Left 12/23/2018   Procedure: LIPOSUCTION WITH LIPOFILLING FROM ABDOMEN TO LEFT BREAST;  Surgeon: Lowery Estefana RAMAN, DO;  Location: [facility];  Service: Plastics;  Laterality: Left;  90 min, please   MASTECTOMY Left 2019   [facility]   MASTECTOMY W/ SENTINEL NODE BIOPSY Left 01/27/2018   ypT1c ypN0; ER 90%, PR 20%, HER-2/neu not overexpressed.  MASTECTOMY WITH SENTINEL LYMPH NODE BIOPSY;  Surgeon: Dessa Reyes ORN, MD;  Location: [facility] ORS;  Service: General;  Laterality: Left;   MINOR BREAST BIOPSY Right 09/02/2017   Radiology perform procedure, fibrocystic changes right retroareolar area.   NASAL RECONSTRUCTION  1983   OOPHORECTOMY     POLYPECTOMY  03/27/2023   Procedure: POLYPECTOMY;  Surgeon: Maryruth Ole DASEN, MD;  Location: [facility] ENDOSCOPY;  Service: Endoscopy;;   PORT-A-CATH REMOVAL     PORTACATH PLACEMENT N/A 10/21/2017   Procedure: INSERTION PORT-A-CATH;  Surgeon: Dessa Reyes ORN, MD;  Location: [facility] ORS;  Service: General;  Laterality: N/A;   REMOVAL OF TISSUE EXPANDER AND PLACEMENT OF IMPLANT Left 05/13/2018   Procedure: REMOVAL OF TISSUE EXPANDER AND PLACEMENT OF IMPLANT;  Surgeon: Lowery Estefana RAMAN, DO;  Location: [facility] ORS;  Service: Plastics;  Laterality: Left;  total surgery time should be 3 hours, per provider   REVERSE SHOULDER ARTHROPLASTY Right 11/27/2020   Procedure: REVERSE SHOULDER ARTHROPLASTY;  Surgeon: Edie Norleen PARAS, MD;  Location: [facility] ORS;  Service: Orthopedics;  Laterality: Right;   SAVORY DILATION N/A 01/12/2015   Procedure: SAVORY DILATION;  Surgeon: Lamar DASEN Holmes, MD;  Location: [facility] ENDOSCOPY;  Service: Endoscopy;  Laterality: N/A;   TONSILLECTOMY AND  ADENOIDECTOMY  1956   Patient Active Problem List   Diagnosis Date Noted   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   Status post reverse arthroplasty of shoulder, right 11/27/2020   Carcinoma of overlapping sites of left breast in female, estrogen receptor positive (HCC) 07/30/2020   Personal history of other malignant neoplasm of skin 11/17/2019   Complex tear of medial meniscus of left knee as current injury 08/10/2019   Primary osteoarthritis of left knee 08/10/2019   S/P breast reconstruction, left 04/15/2019   Acquired absence of left breast 12/23/2018   Breast asymmetry following reconstructive surgery 08/31/2018   Acquired absence of breast 02/09/2018   S/P mastectomy, left 02/02/2018   Breast cancer (HCC) 01/27/2018   Thrush 11/05/2017   Goals of care, counseling/discussion 10/09/2017   Hypokalemia 10/09/2017   Acute renal failure (ARF) 09/11/2017   Diarrhea 09/11/2017   Encounter for antineoplastic chemotherapy 09/03/2017   Nodule of upper lobe of right lung 08/24/2017   Osteopenia 08/24/2017   Malignant neoplasm of left female breast (HCC) 08/07/2017  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   Incomplete emptying of bladder 09/03/2016   SUI (stress urinary incontinence, female) 09/03/2016   Mass of right breast 02/14/2014   Mass of upper outer quadrant of left breast 01/20/2014   Arthritis 12/28/2013   Asthma without status asthmaticus 12/28/2013   Esophageal reflux 12/28/2013   Hyperlipidemia 12/28/2013   Hypertension 05/09/2003    PCP: [facility]ikay   REFERRING PROVIDER: Stoiff   REFERRING DIAG: Overactive bladder with urge incontinence/stress incontinence for pelvic floor PT   Rationale for Evaluation and Treatment Rehabilitation  THERAPY DIAG:  Sacrococcygeal disorders, not elsewhere classified  Other abnormalities of gait and mobility  Chronic pain of left knee  Stiffness of right shoulder, not elsewhere classified  ONSET DATE:    SUBJECTIVE:       SUBJECTIVE STATEMENT TODAY: Pt talked to pulmonologist and he did not recommend a sleep study                                                                                                                                                                            SUBJECTIVE STATEMENT ON EVAL 12/03/23 : 1) Urinary Leakage:  pt doe snot wear pads. Pt was going through 5-6 pads before she started urinary medication.  Now pt has urgency. Pt has leakage with bending up yard work .  Nocturia 2-3 x / night    2) L knee pain - Pt was suppose to get L TKA but then she held it off because she had get R shoulder surgery. Pt used to walk 3 miles across 4-5 x a week but she has stopped due to pain. 5/10 level  with going up stairs,  getting into SUVs, bending   3) R shoulder stiffness:  Pt had total shoulder reverse placement and underwent PT. Pt is still limited in not raising as high and behind her back like she used to. Pt has dropped a few things. Wiping bowel movements are also affected by her limitation with reaching hand behind back    PERTINENT HISTORY:  L mastectomy with breat implant for breast CA R shoulder surgery  Gall bladder removal  3 Bladder slings surgeries Hysterectomy Basal cell cancer    PAIN:  Are you having pain? Yes: see above  PRECAUTIONS: No  WEIGHT BEARING RESTRICTIONS: No   FALLS:  Has patient fallen in last 6 months? No   LIVING ENVIRONMENT: Lives with: alone  Lives in: one story  Stairs: 4 STE with rail   OCCUPATION: retired Psychologist, occupational ,    PLOF: IND   PATIENT GOALS:   Improve leakage, R shoulder ROM, and decrease L knee pain and return walking routine     OBJECTIVE:  [facility] PT Assessment - 12/10/23 1410       Palpation   SI assessment  L shoulder lowered, R iliac crest lowered    Palpation comment tightness along L interspinals, scalenes, levator scapulae, upper trap, occiput, medial scapula mm tightness , hypomobile/ L  deviated T 7-10          [facility] Adult PT Treatment/Exercise - 12/10/23 1412       Therapeutic Activites    Therapeutic Activities Other Therapeutic Activities    Other Therapeutic Activities cued for sequence , segmental movement with sit to supine , logrolling,  scooting with less straining to pelvic floor      Neuro Re-ed    Neuro Re-ed Details  cued for neck AROM for technique and propioception/ cervical retraction      Modalities   Modalities Moist Heat      Moist Heat Therapy   Number Minutes Moist Heat 5 Minutes    Moist Heat Location Cervical      Manual Therapy   Manual therapy comments distraction , STM/MWM at problem areas noted in assessments to promote realignment of C/T junction            HOME EXERCISE PROGRAM: See pt instruction section    ASSESSMENT:  CLINICAL IMPRESSION:  Following Tx today which pt tolerated without complaints,  pt demo'd proper body mechanics to minimize straining pelvic floor with logrolling and scooting in bed    Manual Tx was applied and adjusted for comfort at C/T junction to realign spine.   Pt showed levelled pelvis post Tx but more manual Tx will still be needed next session to realign C/T junction. Anticipate these improvements will help with R shoulder stiffness, optimize deep core function for pelvic floor , and L knee pain.   Plan to address realignment of spine/ pelvis at next session to help promote optimize IAP system for improved pelvic floor function, trunk stability, gait, balance, stabilization with mobility tasks.  Plan to address pelvic floor issues once pelvis and spine are realigned to yield better outcomes.                                                     Pt benefits from skilled PT.    OBJECTIVE IMPAIRMENTS decreased activity tolerance, decreased coordination, decreased endurance, decreased mobility, difficulty walking, decreased ROM, decreased strength, decreased safety awareness, hypomobility, increased  muscle spasms, impaired flexibility, improper body mechanics, postural dysfunction, and pain, scar restrictions   ACTIVITY LIMITATIONS  self-care, home chores, community activities    PARTICIPATION LIMITATIONS:  community,  walking activities    PERSONAL FACTORS   affecting patient's functional outcome:    REHAB POTENTIAL: Good   CLINICAL DECISION MAKING: Evolving/moderate complexity   EVALUATION COMPLEXITY: Moderate    PATIENT EDUCATION:    Education details: Showed pt anatomy images. Explained muscles attachments/ connection, physiology of deep core system/ spinal- thoracic-pelvis-lower kinetic chain as they relate to pt's presentation, Sx, and past Hx. Explained what and how these areas of deficits need to be restored to balance and function    See Therapeutic activity / neuromuscular re-education section  Answered pt's questions.   Person educated: Patient Education method: Explanation, Demonstration, Tactile cues, Verbal cues, and Handouts Education comprehension: verbalized understanding, returned demonstration, verbal cues required, tactile cues required, and needs further education  PLAN: PT FREQUENCY: 1x/week   PT DURATION: 10 weeks   PLANNED INTERVENTIONS:   Gait training;Stair training;Functional mobility training;DME Instruction;Therapeutic activities;Therapeutic exercise;Balance training;Neuromuscular re-education;Patient/family education;Vestibular;Visual/perceptual remediation/compensation;Passive range of motion;Moist Heat;Cryotherapy;Traction;Canalith Repostioning;Joint Manipulations;Manual lymph drainage;Manual techniques;Scar mobilization;Energy conservation;Dry needling;ADLs/Self Care Home Management;Biofeedback;Electrical Stimulation;Taping    PLAN FOR NEXT SESSION: See clinical impression for plan     GOALS: Goals reviewed with patient? Yes  SHORT TERM GOALS: Target date: 12/31/2023    Pt will demo IND with HEP                    Baseline: Not  IND            Goal status: INITIAL   LONG TERM GOALS: Target date: 02/11/2024    1.Pt will demo proper deep core coordination without chest breathing and optimal excursion of diaphragm/pelvic floor in order to  improve urgency and leakage with bending up yard work . Baseline: dyscoordination Goal status: INITIAL  2.  Pt will demo proper body mechanics in against gravity tasks and ADLs  work tasks, fitness  to minimize straining pelvic floor / back    Baseline: not IND, improper form that places strain on pelvic floor  Goal status: INITIAL    3. Pt will demo increased gait speed > 1.3 m/s with reciprocal gait pattern, longer stride length  in order to ambulate safely in community and build back up  to regular walking routine of 3 miles - 4-5 x a week  Baseline: decreased R stance, limited posterior rotation of R pelvis , 1.04 m/s  Goal status: INITIAL    4. Pt will demo levelled pelvic girdle and shoulder height in order to progress to deep core strengthening HEP and restore mobility at spine, pelvis, gait, posture minimize falls, and improve balance  Baseline: L shoulder and L iliac crest lowered  Goal status: INITIAL   5. Pt will improve PFDI-7 questionnaire to  pts  score change  to demo improved QOL  Baseline:    ( greater pts indicate greater negative impact on QOL)     pts  ( total)    pts  ( UIQ-7 )    pts  ( CRAIQ-7 )    pts  ( POPIQ-7 )  Baseline:  Goal status: INITIAL  6. Pt will improve AROM with reach behind back ( R thumb above level of L5) to be able to reeach behind to clean after BMs and shoulder flexion > 130 deg ) for reaching above cabinets  Baseline:  reach hand behind back: R thumb at PSIS level, L T 5 level   shoulder flexion seated: 120 deg R, 160 deg L   Wiping bowel movements are also affected by her limitation with reaching hand behind back    Reaching up to cabinets   Goal Status: INITIAL:    7.  Pt will communicate with pulmonologist  for sleep study to rule in / out OSA   Baseline:      Nocturia 2-3 x / night  Goal Status: MET Pt talked to pulmonologist and he did not recommend a sleep study   Pia Lupe Plump, PT 12/10/2023, 1:39 PM

## 2023-12-16 ENCOUNTER — Encounter: Payer: Self-pay | Admitting: Dermatology

## 2023-12-16 ENCOUNTER — Ambulatory Visit: Admitting: Dermatology

## 2023-12-16 DIAGNOSIS — L508 Other urticaria: Secondary | ICD-10-CM | POA: Diagnosis not present

## 2023-12-16 DIAGNOSIS — L299 Pruritus, unspecified: Secondary | ICD-10-CM | POA: Diagnosis not present

## 2023-12-16 DIAGNOSIS — L509 Urticaria, unspecified: Secondary | ICD-10-CM

## 2023-12-16 DIAGNOSIS — Z7189 Other specified counseling: Secondary | ICD-10-CM

## 2023-12-16 DIAGNOSIS — Z79899 Other long term (current) drug therapy: Secondary | ICD-10-CM

## 2023-12-16 DIAGNOSIS — L501 Idiopathic urticaria: Secondary | ICD-10-CM

## 2023-12-16 MED ORDER — DUPILUMAB 300 MG/2ML ~~LOC~~ SOAJ
300.0000 mg | Freq: Once | SUBCUTANEOUS | Status: AC
  Administered 2023-12-16: 300 mg via SUBCUTANEOUS

## 2023-12-16 NOTE — Progress Notes (Signed)
   Follow-Up Visit   Subjective  Mariah Hogan is a 73 y.o. female who presents for the following: 2 week follow up on chronic idiopathic urticaria with pruritus. Started Dupixent  11/16/2023. Has not been breaking out as badly as before. Still itching occasionally but not as bad.  Denies side effects, adverse reactions or injection site reactions.   The following portions of the chart were reviewed this encounter and updated as appropriate: medications, allergies, medical history  Review of Systems:  No other skin or systemic complaints except as noted in HPI or Assessment and Plan.  Objective  Well appearing patient in no apparent distress; mood and affect are within normal limits.  A focused examination was performed of the following areas: Back, scalp and arms  Relevant exam findings are noted in the Assessment and Plan.        Assessment & Plan   URTICARIA with PRURITUS Chronic idiopathic urticaria   Vs urticarial medication reaction from Lyrica (pt is off Lyrica) Started Dupixent  11/16/23 Back, scalp Exam: mild erythema back  Chronic and persistent condition with duration or expected duration over one year. Condition is improving with treatment but not currently at goal. Urticaria or hives is a pink to red patchy whelp- like rash of the skin that typically itches and it is the result of histamine release in the skin.   Hives may have multiple causes including stress, medications, infections, and systemic illness.  Sometimes there is a family history of chronic urticaria.   Physical urticarias may be caused by pressure (dermatographism), heat, sun, cold, vibration.  Insect bites can cause papular urticaria. It is often difficult to find the cause of generalized hives.  Statistically, 70% of the time a cause of generalized hives is not found.  Sometimes hives can spontaneously resolve. Other times hives can persist and when it does, and no cause is found, and it has been  at least 6 weeks since started, it is called chronic idiopathic urticaria. Antihistamines are the mainstay for treatment.  In severe cases Xolair injections may be used.    Treatment Plan: Cont Zyrtec 1 po bid Cont Dupixent  300mg /98ml sq injections q 2 wks Dupixent  300mg /65ml sq injections today to R anterior thigh  sample x 1  NDC: 9975-4084-79 Lot [id number], 09/06/2025  Plan to continue at least one more injection after this. If not improving consider switching to Xolair.   CHRONIC IDIOPATHIC URTICARIA   Related Medications Dupilumab  SOAJ 300 mg  PRURITUS   Related Medications Dupilumab  SOAJ 300 mg   Return in about 2 weeks (around 12/30/2023) for Uriticaria follow up, Dupixent  Follow Up.  I, Kate Fought, CMA, am acting as scribe for Alm Rhyme, MD.   Documentation: I have reviewed the above documentation for accuracy and completeness, and I agree with the above.  Alm Rhyme, MD

## 2023-12-16 NOTE — Patient Instructions (Signed)
 Continue Dupixent  300 mg/2 ml every 2 weeks.    Continue Zyrtec 5 mg as directed.     Dupilumab  (Dupixent ) is a biologic treatment given by injection for adults and children with moderate-to-severe atopic dermatitis. Goal is control of skin condition, not cure. It is given as 2 injections at the first dose followed by 1 injection every 2 weeks thereafter.  Young children are dosed monthly.  Potential side effects include allergic reaction, herpes infections, injection site reactions and conjunctivitis (inflammation of the eyes).  The use of Dupixent  requires long term medication management, including periodic office visits.    Gentle Skin Care Guide  1. Bathe no more than once a day.  2. Avoid bathing in hot water   3. Use a mild soap like Dove, Vanicream, Cetaphil, CeraVe. Can use Lever 2000 or Cetaphil antibacterial soap  4. Use soap only where you need it. On most days, use it under your arms, between your legs, and on your feet. Let the water  rinse other areas unless visibly dirty.  5. When you get out of the bath/shower, use a towel to gently blot your skin dry, don't rub it.  6. While your skin is still a little damp, apply a moisturizing cream such as Vanicream, CeraVe, Cetaphil, Eucerin, Sarna lotion or plain Vaseline Jelly. For hands apply Neutrogena [place] Hand Cream or Excipial Hand Cream.  7. Reapply moisturizer any time you start to itch or feel dry.  8. Sometimes using free and clear laundry detergents can be helpful. Fabric softener sheets should be avoided. Downy Free & Gentle liquid, or any liquid fabric softener that is free of dyes and perfumes, it acceptable to use  9. If your doctor has given you prescription creams you may apply moisturizers over them       Due to recent changes in healthcare laws, you may see results of your pathology and/or laboratory studies on MyChart before the doctors have had a chance to review them. We understand that in some cases  there may be results that are confusing or concerning to you. Please understand that not all results are received at the same time and often the doctors may need to interpret multiple results in order to provide you with the best plan of care or course of treatment. Therefore, we ask that you please give us  2 business days to thoroughly review all your results before contacting the office for clarification. Should we see a critical lab result, you will be contacted sooner.   If You Need Anything After Your Visit  If you have any questions or concerns for your doctor, please call our main line at [phone] and press option 4 to reach your doctor's medical assistant. If no one answers, please leave a voicemail as directed and we will return your call as soon as possible. Messages left after 4 pm will be answered the following business day.   You may also send us  a message via MyChart. We typically respond to MyChart messages within 1-2 business days.  For prescription refills, please ask your pharmacy to contact our office. Our fax number is [phone].  If you have an urgent issue when the clinic is closed that cannot wait until the next business day, you can page your doctor at the number below.    Please note that while we do our best to be available for urgent issues outside of office hours, we are not available 24/7.   If you have an urgent issue and are  unable to reach us , you may choose to seek medical care at your doctor's office, retail clinic, urgent care center, or emergency room.  If you have a medical emergency, please immediately call 911 or go to the emergency department.  Pager Numbers  - Dr. Hester: [phone]  - Dr. Jackquline: [phone]  - Dr. Claudene: [phone]   - Dr. Raymund: [phone]  In the event of inclement weather, please call our main line at [phone] for an update on the status of any delays or closures.  Dermatology Medication Tips: Please  keep the boxes that topical medications come in in order to help keep track of the instructions about where and how to use these. Pharmacies typically print the medication instructions only on the boxes and not directly on the medication tubes.   If your medication is too expensive, please contact our office at [phone] option 4 or send us  a message through MyChart.   We are unable to tell what your co-pay for medications will be in advance as this is different depending on your insurance coverage. However, we may be able to find a substitute medication at lower cost or fill out paperwork to get insurance to cover a needed medication.   If a prior authorization is required to get your medication covered by your insurance company, please allow us  1-2 business days to complete this process.  Drug prices often vary depending on where the prescription is filled and some pharmacies may offer cheaper prices.  The website www.goodrx.com contains coupons for medications through different pharmacies. The prices here do not account for what the cost may be with help from insurance (it may be cheaper with your insurance), but the website can give you the price if you did not use any insurance.  - You can print the associated coupon and take it with your prescription to the pharmacy.  - You may also stop by our office during regular business hours and pick up a GoodRx coupon card.  - If you need your prescription sent electronically to a different pharmacy, notify our office through [facility] or by phone at [phone] option 4.     Si Usted Necesita Algo Despus de Su Visita  Tambin puede enviarnos un mensaje a travs de Clinical cytogeneticist. Por lo general respondemos a los mensajes de MyChart en el transcurso de 1 a 2 das hbiles.  Para renovar recetas, por favor pida a su farmacia que se ponga en contacto con nuestra oficina. Randi lakes de fax es [place] [phone].  Si tiene un asunto urgente  cuando la clnica est cerrada y que no puede esperar hasta el siguiente da hbil, puede llamar/localizar a su doctor(a) al nmero que aparece a continuacin.   Por favor, tenga en cuenta que aunque hacemos todo lo posible para estar disponibles para asuntos urgentes fuera del horario de [place], no estamos disponibles las 24 horas del da, los 7 [address]  [address] de la [place].   Si tiene un problema urgente y no puede comunicarse con nosotros, puede optar por buscar atencin mdica  en el consultorio de su doctor(a), en una clnica privada, en un centro de atencin urgente o en una sala de emergencias.  Si tiene Engineer, drilling, por favor llame inmediatamente al 911 o vaya a la sala de emergencias.  Nmeros de bper  - Dr. Hester: [phone]  - Dra. Jackquline: [phone]  - Dr. Claudene: [phone]  - Dra. Kitts: [phone]  En caso de inclemencias del tiempo, por favor llame a  landry capes principal al [phone] para una actualizacin sobre el [place] de cualquier retraso o cierre.  Consejos para la medicacin en dermatologa: Por favor, guarde las cajas en las que vienen los medicamentos de uso tpico para ayudarle a seguir las instrucciones sobre dnde y cmo usarlos. Las farmacias generalmente imprimen las instrucciones del medicamento slo en las cajas y no directamente en los tubos del [place].   Si su medicamento es muy caro, por favor, pngase en contacto con landry rieger llamando al [phone] y presione la opcin 4 o envenos un mensaje a travs de Clinical cytogeneticist.   No podemos decirle cul ser su copago por los medicamentos por adelantado ya que esto es diferente dependiendo de la cobertura de su seguro. Sin embargo, es posible que podamos encontrar un medicamento sustituto a Audiological scientist un formulario para que el seguro cubra el medicamento que se considera necesario.   Si se requiere una autorizacin previa para que su compaa de seguros malta su medicamento, por  favor permtanos de 1 a 2 das hbiles para completar este proceso.  Los precios de los medicamentos varan con frecuencia dependiendo del Environmental consultant de dnde se surte la receta y alguna farmacias pueden ofrecer precios ms baratos.  El sitio web www.goodrx.com tiene cupones para medicamentos de Health and safety inspector. Los precios aqu no tienen en cuenta lo que podra costar con la ayuda del seguro (puede ser ms barato con su seguro), pero el sitio web puede darle el precio si no utiliz Tourist information centre manager.  - Puede imprimir el cupn correspondiente y llevarlo con su receta a la farmacia.  - Tambin puede pasar por nuestra oficina durante el horario de atencin regular y Education officer, museum una tarjeta de cupones de GoodRx.  - Si necesita que su receta se enve electrnicamente a una farmacia diferente, informe a nuestra oficina a travs de MyChart de [place] o por telfono llamando al [phone] y presione la opcin 4.

## 2023-12-17 ENCOUNTER — Ambulatory Visit: Admitting: Physical Therapy

## 2023-12-24 ENCOUNTER — Ambulatory Visit: Admitting: Physical Therapy

## 2023-12-24 DIAGNOSIS — M25611 Stiffness of right shoulder, not elsewhere classified: Secondary | ICD-10-CM

## 2023-12-24 DIAGNOSIS — R2689 Other abnormalities of gait and mobility: Secondary | ICD-10-CM

## 2023-12-24 DIAGNOSIS — M533 Sacrococcygeal disorders, not elsewhere classified: Secondary | ICD-10-CM

## 2023-12-24 DIAGNOSIS — G8929 Other chronic pain: Secondary | ICD-10-CM

## 2023-12-24 NOTE — Patient Instructions (Signed)
 Lengthen Back rib by L  shoulder ( winging)  Lie on R  side , pillow between knees and under head  Pull  L arm overhead over mattress, grab the edge of mattress,pull it upward, drawing elbow away from ears  Breathing 10 reps Brushing arm with 3/4 turn onto pillow behind back  Lying on R  side ,Pillow/ Block between knees  dragging top forearm across ribs below breast rotating 3/4 turn,  rotating  _L_ only this week ,  relax onto the pillow behind the back  and then back to other palm , maintain top palm on body whole top and not lift shoulder Do this side this week       Wait do both sides until we have levelled out your spine and shoulders ___   Seated : Unfurling:   Dragging hands on thigh and elbows back, chest lifts to minimize hunch back   10 reps    ___

## 2023-12-24 NOTE — Therapy (Signed)
 OUTPATIENT PHYSICAL THERAPY TREATMENT    Patient Name: Mariah Hogan MRN: [id number] DOB:23-Dec-1950, 73 y.o., female Today's Date: 12/24/2023   PT End of Session - 12/24/23 1338     Visit Number 3    Number of Visits 10    Date for Recertification  02/11/24    PT Start Time 1335    PT Stop Time 1415    PT Time Calculation (min) 40 min    Activity Tolerance Patient tolerated treatment well;No increased pain    Behavior During Therapy WFL for tasks assessed/performed          Past Medical History:  Diagnosis Date   Anemia    Arthritis    Asthma    H/O YEARS AGO-NOW HAS COUGHING SPELLS WHICH IS WHY SHE HAS HER ALBUTEROL  INHALER   Basal cell carcinoma    R nose (MOHS)   Basal cell carcinoma 01/29/2016   Right forehead above med brow   Basal cell carcinoma 10/12/2019   L nasal tip - MOHS 05/15/2020   BRCA gene mutation negative 08/12/2017   Variant of unknown significance at APC only abnormality.   Breast cancer (HCC) 08/04/2017   1.8 cm ER 90%, PR 20%, HER-2/neu negative invasive mammary carcinoma of the left upper outer quadrant.  MammaPrint: High risk.   Cancer (HCC)    [phone] basal cell carcinoma   Complication of anesthesia    HARD TIME WAKING UP   Dysplastic nevus 02/21/2010   Right mid pretibial, mild   Dysrhythmia    TACHYCARDIA   GERD (gastroesophageal reflux disease)    History of hiatal hernia    SMALL   Hyperlipidemia    Hypertension    PT STATES IN 01-18-18 PREOP PHONE INTERVIEW THAT HER BP HAS BEEN ELEVATED SINCE STARTING ON VALSARTAN- PCP SWITCHED PT BACK TO MAXIDE ON 01-18-18 AND PT STATES THAT SHE HAS LOST 6-10 POUNDS OF FLUID SINCE RESTARTING MAXIDE.     Personal history of chemotherapy    PONV (postoperative nausea and vomiting)    Sjogren's syndrome 07/2020   Past Surgical History:  Procedure Laterality Date   ABDOMINAL HYSTERECTOMY  1998   BIOPSY  03/27/2023   Procedure: BIOPSY;  Surgeon: Maryruth Ole DASEN, MD;  Location: [facility]  ENDOSCOPY;  Service: Endoscopy;;   bladder tack  [phone]   BREAST BIOPSY Left 1990   BREAST BIOPSY Left 2018   core bx- neg   BREAST BIOPSY Left 08/04/2017   left UOQ 2 oclock INVASIVE DUCTAL CARCINOMA.   BREAST BIOPSY Right 08/2017   MRI biopsy, FIBROCYSTIC CHANGE AND USUAL DUCTAL HYPERPLASIA,, clip placed   BREAST BIOPSY Right 11/11/2023   MM RT BREAST BX W LOC DEV 1ST LESION IMAGE BX SPEC STEREO GUIDE 11/11/2023 [facility]   BREAST CYST EXCISION  x3   BREAST EXCISIONAL BIOPSY Bilateral    BREAST RECONSTRUCTION WITH PLACEMENT OF TISSUE EXPANDER AND FLEX HD (ACELLULAR HYDRATED DERMIS) Left 01/27/2018   Procedure: BREAST RECONSTRUCTION WITH PLACEMENT OF TISSUE EXPANDER AND FLEX HD (ACELLULAR HYDRATED DERMIS);  Surgeon: Lowery Estefana RAMAN, DO;  Location: [facility] ORS;  Service: Plastics;  Laterality: Left;   BREAST SURGERY Left 02/13/14   Fibrocystic changes, pseudo-angiomatous stromal hyperplasia. No atypia or malignancy.   CARPAL TUNNEL RELEASE Bilateral x2   CHOLECYSTECTOMY  2008   COLONOSCOPY WITH PROPOFOL  N/A 01/12/2015   Procedure: COLONOSCOPY WITH PROPOFOL ;  Surgeon: Lamar DASEN Holmes, MD;  Location: [facility] ENDOSCOPY;  Service: Endoscopy;  Laterality: N/A;   COLONOSCOPY WITH PROPOFOL  N/A 02/06/2020  Procedure: COLONOSCOPY WITH PROPOFOL ;  Surgeon: Maryruth Ole DASEN, MD;  Location: [facility] ENDOSCOPY;  Service: Endoscopy;  Laterality: N/A;   ESOPHAGEAL DILATION  03/27/2023   Procedure: ESOPHAGEAL DILATION;  Surgeon: Maryruth Ole DASEN, MD;  Location: [facility] ENDOSCOPY;  Service: Endoscopy;;   ESOPHAGOGASTRODUODENOSCOPY (EGD) WITH PROPOFOL  N/A 01/12/2015   Procedure: ESOPHAGOGASTRODUODENOSCOPY (EGD) WITH PROPOFOL ;  Surgeon: Lamar DASEN Holmes, MD;  Location: [facility] ENDOSCOPY;  Service: Endoscopy;  Laterality: N/A;   ESOPHAGOGASTRODUODENOSCOPY (EGD) WITH PROPOFOL  N/A 02/06/2020   Procedure: ESOPHAGOGASTRODUODENOSCOPY (EGD) WITH PROPOFOL ;  Surgeon: Maryruth Ole DASEN, MD;  Location: [facility] ENDOSCOPY;   Service: Endoscopy;  Laterality: N/A;   ESOPHAGOGASTRODUODENOSCOPY (EGD) WITH PROPOFOL  N/A 03/27/2023   Procedure: ESOPHAGOGASTRODUODENOSCOPY (EGD) WITH PROPOFOL ;  Surgeon: Maryruth Ole DASEN, MD;  Location: [facility] ENDOSCOPY;  Service: Endoscopy;  Laterality: N/A;   LIPOSUCTION WITH LIPOFILLING Left 12/23/2018   Procedure: LIPOSUCTION WITH LIPOFILLING FROM ABDOMEN TO LEFT BREAST;  Surgeon: Lowery Estefana RAMAN, DO;  Location: [facility];  Service: Plastics;  Laterality: Left;  90 min, please   MASTECTOMY Left 2019   [facility]   MASTECTOMY W/ SENTINEL NODE BIOPSY Left 01/27/2018   ypT1c ypN0; ER 90%, PR 20%, HER-2/neu not overexpressed.  MASTECTOMY WITH SENTINEL LYMPH NODE BIOPSY;  Surgeon: Dessa Reyes ORN, MD;  Location: [facility] ORS;  Service: General;  Laterality: Left;   MINOR BREAST BIOPSY Right 09/02/2017   Radiology perform procedure, fibrocystic changes right retroareolar area.   NASAL RECONSTRUCTION  1983   OOPHORECTOMY     POLYPECTOMY  03/27/2023   Procedure: POLYPECTOMY;  Surgeon: Maryruth Ole DASEN, MD;  Location: [facility] ENDOSCOPY;  Service: Endoscopy;;   PORT-A-CATH REMOVAL     PORTACATH PLACEMENT N/A 10/21/2017   Procedure: INSERTION PORT-A-CATH;  Surgeon: Dessa Reyes ORN, MD;  Location: [facility] ORS;  Service: General;  Laterality: N/A;   REMOVAL OF TISSUE EXPANDER AND PLACEMENT OF IMPLANT Left 05/13/2018   Procedure: REMOVAL OF TISSUE EXPANDER AND PLACEMENT OF IMPLANT;  Surgeon: Lowery Estefana RAMAN, DO;  Location: [facility] ORS;  Service: Plastics;  Laterality: Left;  total surgery time should be 3 hours, per provider   REVERSE SHOULDER ARTHROPLASTY Right 11/27/2020   Procedure: REVERSE SHOULDER ARTHROPLASTY;  Surgeon: Edie Norleen PARAS, MD;  Location: [facility] ORS;  Service: Orthopedics;  Laterality: Right;   SAVORY DILATION N/A 01/12/2015   Procedure: SAVORY DILATION;  Surgeon: Lamar DASEN Holmes, MD;  Location: [name] ENDOSCOPY;  Service: Endoscopy;  Laterality: N/A;   TONSILLECTOMY AND  ADENOIDECTOMY  1956   Patient Active Problem List   Diagnosis Date Noted   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   Status post reverse arthroplasty of shoulder, right 11/27/2020   Carcinoma of overlapping sites of left breast in female, estrogen receptor positive (HCC) 07/30/2020   Personal history of other malignant neoplasm of skin 11/17/2019   Complex tear of medial meniscus of left knee as current injury 08/10/2019   Primary osteoarthritis of left knee 08/10/2019   S/P breast reconstruction, left 04/15/2019   Acquired absence of left breast 12/23/2018   Breast asymmetry following reconstructive surgery 08/31/2018   Acquired absence of breast 02/09/2018   S/P mastectomy, left 02/02/2018   Breast cancer (HCC) 01/27/2018   Thrush 11/05/2017   Goals of care, counseling/discussion 10/09/2017   Hypokalemia 10/09/2017   Acute renal failure (ARF) 09/11/2017   Diarrhea 09/11/2017   Encounter for antineoplastic chemotherapy 09/03/2017   Nodule of upper lobe of right lung 08/24/2017   Osteopenia 08/24/2017   Malignant neoplasm of left female breast (HCC) 08/07/2017  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   Incomplete emptying of bladder 09/03/2016   SUI (stress urinary incontinence, female) 09/03/2016   Mass of right breast 02/14/2014   Mass of upper outer quadrant of left breast 01/20/2014   Arthritis 12/28/2013   Asthma without status asthmaticus 12/28/2013   Esophageal reflux 12/28/2013   Hyperlipidemia 12/28/2013   Hypertension 05/09/2003    PCP: Marikay   REFERRING PROVIDER: Stoiff   REFERRING DIAG: Overactive bladder with urge incontinence/stress incontinence for pelvic floor PT   Rationale for Evaluation and Treatment Rehabilitation  THERAPY DIAG:  Sacrococcygeal disorders, not elsewhere classified  Other abnormalities of gait and mobility  Chronic pain of left knee  Stiffness of right shoulder, not elsewhere classified  ONSET DATE:    SUBJECTIVE:       SUBJECTIVE STATEMENT TODAY: Pt talked to pulmonologist and he did not recommend a sleep study                                                                                                                                                                            SUBJECTIVE STATEMENT ON EVAL 12/03/23 : 1) Urinary Leakage:  pt doe snot wear pads. Pt was going through 5-6 pads before she started urinary medication.  Now pt has urgency. Pt has leakage with bending up yard work .  Nocturia 2-3 x / night    2) L knee pain - Pt was suppose to get L TKA but then she held it off because she had get R shoulder surgery. Pt used to walk 3 miles across 4-5 x a week but she has stopped due to pain. 5/10 level  with going up stairs,  getting into SUVs, bending   3) R shoulder stiffness:  Pt had total shoulder reverse placement and underwent PT. Pt is still limited in not raising as high and behind her back like she used to. Pt has dropped a few things. Wiping bowel movements are also affected by her limitation with reaching hand behind back    PERTINENT HISTORY:  L mastectomy with breat implant for breast CA R shoulder surgery  Gall bladder removal  3 Bladder slings surgeries Hysterectomy Basal cell cancer    PAIN:  Are you having pain? Yes: see above  PRECAUTIONS: No  WEIGHT BEARING RESTRICTIONS: No   FALLS:  Has patient fallen in last 6 months? No   LIVING ENVIRONMENT: Lives with: alone  Lives in: one story  Stairs: 4 STE with rail   OCCUPATION: retired Psychologist, occupational ,    PLOF: IND   PATIENT GOALS:   Improve leakage, R shoulder ROM, and decrease L knee pain and return walking routine     OBJECTIVE:  [facility] PT Assessment - 12/24/23 1339       Sit to Stand   Comments good carry over with hip abduction / sequence and technique      AROM   Overall AROM Comments difficulty and limited cervicl sideflexion B, requiring more manual Tx at C/T junction to minimize  thoracic kyphosis and forwawrd head posture      Palpation   Spinal mobility limited cervical mobility with L rotation    SI assessment  standing: pelvis levelled / R shoulder higher,  seated:  R shoulder higher    Palpation comment convex curve T10-12, L2   L, tightness along intercostals, interspinals, paraspinals, L , upper trap overuse          [facility] Adult PT Treatment/Exercise - 12/24/23 1405       Self-Care   Self-Care Other Self-Care Comments    Other Self-Care Comments  answered question about use of pillows between knees and under neck   Explained the importance of upright posture to help with breathing, continence      Neuro Re-ed    Neuro Re-ed Details  cued for cervical ROM with review from last visit but withholding from HEP due to more need for manual Tx to increase mobility.  cued for propioception for cervical retraction and  less upper trap overuse with L with new thoracic rotation HEP      Modalities   Modalities Moist Heat      Moist Heat Therapy   Number Minutes Moist Heat 5 Minutes    Moist Heat Location --   thoracic, during new HEP practice     Manual Therapy   Manual therapy comments STM/MWM at problem areas noted in assessment to rpomote realignment of spine            HOME EXERCISE PROGRAM: See pt instruction section    ASSESSMENT:  CLINICAL IMPRESSION:  Pt showed good carry over with pelvic girdle.  Manual Tx was applied again to  realign T/L junction to minimize curves and realign shoulders and optimize diaphragm excursion.  Added thoracic mobility HEP. Cued for cervical ROM with review from last visit but withholding from HEP due to more need for manual Tx to increase mobility.  Cued for propioception for cervical retraction and  less upper trap overuse with L with new thoracic rotation HEP   More manual Tx will still be needed next session to realign C/T junction due to limited mobility in sideflexion and rotation of cervical spine.    Anticipate these improvements will help with R shoulder stiffness, optimize deep core function for pelvic floor , and L knee pain.   Plan to address realignment of spine/ pelvis at next session to help promote optimize IAP system for improved pelvic floor function, trunk stability, gait, balance, stabilization with mobility tasks.  Plan to address pelvic floor issues once pelvis and spine are realigned to yield better outcomes.                                                     Pt benefits from skilled PT.    OBJECTIVE IMPAIRMENTS decreased activity tolerance, decreased coordination, decreased endurance, decreased mobility, difficulty walking, decreased ROM, decreased strength, decreased safety awareness, hypomobility, increased muscle spasms, impaired flexibility, improper body mechanics, postural dysfunction, and pain, scar restrictions   ACTIVITY LIMITATIONS  self-care, home chores, community activities    PARTICIPATION LIMITATIONS:  community,  walking activities    PERSONAL FACTORS   affecting patient's functional outcome:    REHAB POTENTIAL: Good   CLINICAL DECISION MAKING: Evolving/moderate complexity   EVALUATION COMPLEXITY: Moderate    PATIENT EDUCATION:    Education details: Showed pt anatomy images. Explained muscles attachments/ connection, physiology of deep core system/ spinal- thoracic-pelvis-lower kinetic chain as they relate to pt's presentation, Sx, and past Hx. Explained what and how these areas of deficits need to be restored to balance and function    See Therapeutic activity / neuromuscular re-education section  Answered pt's questions.   Person educated: Patient Education method: Explanation, Demonstration, Tactile cues, Verbal cues, and Handouts Education comprehension: verbalized understanding, returned demonstration, verbal cues required, tactile cues required, and needs further education     PLAN: PT FREQUENCY: 1x/week   PT DURATION: 10 weeks    PLANNED INTERVENTIONS:   Gait training;Stair training;Functional mobility training;DME Instruction;Therapeutic activities;Therapeutic exercise;Balance training;Neuromuscular re-education;Patient/family education;Vestibular;Visual/perceptual remediation/compensation;Passive range of motion;Moist Heat;Cryotherapy;Traction;Canalith Repostioning;Joint Manipulations;Manual lymph drainage;Manual techniques;Scar mobilization;Energy conservation;Dry needling;ADLs/Self Care Home Management;Biofeedback;Electrical Stimulation;Taping    PLAN FOR NEXT SESSION: See clinical impression for plan     GOALS: Goals reviewed with patient? Yes  SHORT TERM GOALS: Target date: 12/31/2023    Pt will demo IND with HEP                    Baseline: Not IND            Goal status: INITIAL   LONG TERM GOALS: Target date: 02/11/2024    1.Pt will demo proper deep core coordination without chest breathing and optimal excursion of diaphragm/pelvic floor in order to  improve urgency and leakage with bending up yard work . Baseline: dyscoordination Goal status: INITIAL  2.  Pt will demo proper body mechanics in against gravity tasks and ADLs  work tasks, fitness  to minimize straining pelvic floor / back    Baseline: not IND, improper form that places strain on pelvic floor  Goal status: INITIAL    3. Pt will demo increased gait speed > 1.3 m/s with reciprocal gait pattern, longer stride length  in order to ambulate safely in community and build back up  to regular walking routine of 3 miles - 4-5 x a week  Baseline: decreased R stance, limited posterior rotation of R pelvis , 1.04 m/s  Goal status: INITIAL    4. Pt will demo levelled pelvic girdle and shoulder height in order to progress to deep core strengthening HEP and restore mobility at spine, pelvis, gait, posture minimize falls, and improve balance  Baseline: L shoulder and L iliac crest lowered  Goal status: INITIAL   5. Pt will improve PFDI-7  questionnaire to  pts  score change  to demo improved QOL  Baseline:    ( greater pts indicate greater negative impact on QOL)     pts  ( total)    pts  ( UIQ-7 )    pts  ( CRAIQ-7 )    pts  ( POPIQ-7 )  Baseline:  Goal status: INITIAL  6. Pt will improve AROM with reach behind back ( R thumb above level of L5) to be able to reeach behind to clean after BMs and shoulder flexion > 130 deg ) for reaching above cabinets  Baseline:  reach hand behind back: R thumb at PSIS level, L T 5 level   shoulder flexion seated: 120 deg  R, 160 deg L   Wiping bowel movements are also affected by her limitation with reaching hand behind back    Reaching up to cabinets   Goal Status: INITIAL:    7.  Pt will communicate with pulmonologist for sleep study to rule in / out OSA   Baseline:      Nocturia 2-3 x / night  Goal Status: MET Pt talked to pulmonologist and he did not recommend a sleep study   Pia Lupe Plump, PT 12/24/2023, 2:15 PM

## 2023-12-30 ENCOUNTER — Encounter: Payer: Self-pay | Admitting: Dermatology

## 2023-12-30 ENCOUNTER — Ambulatory Visit: Admitting: Dermatology

## 2023-12-30 DIAGNOSIS — L299 Pruritus, unspecified: Secondary | ICD-10-CM | POA: Diagnosis not present

## 2023-12-30 DIAGNOSIS — Z79899 Other long term (current) drug therapy: Secondary | ICD-10-CM

## 2023-12-30 DIAGNOSIS — L501 Idiopathic urticaria: Secondary | ICD-10-CM | POA: Diagnosis not present

## 2023-12-30 DIAGNOSIS — Z7189 Other specified counseling: Secondary | ICD-10-CM | POA: Diagnosis not present

## 2023-12-30 MED ORDER — DUPILUMAB 300 MG/2ML ~~LOC~~ SOAJ
300.0000 mg | Freq: Once | SUBCUTANEOUS | Status: AC
  Administered 2023-12-30: 300 mg via SUBCUTANEOUS

## 2023-12-30 NOTE — Patient Instructions (Signed)
 Dupilumab  (Dupixent ) is a biologic treatment given by injection for adults and children with moderate-to-severe atopic dermatitis. Goal is control of skin condition, not cure. It is given as 2 injections at the first dose followed by 1 injection every 2 weeks thereafter.  Young children are dosed monthly.  Potential side effects include allergic reaction, herpes infections, injection site reactions and conjunctivitis (inflammation of the eyes).  The use of Dupixent  requires long term medication management, including periodic office visits.  Due to recent changes in healthcare laws, you may see results of your pathology and/or laboratory studies on MyChart before the doctors have had a chance to review them. We understand that in some cases there may be results that are confusing or concerning to you. Please understand that not all results are received at the same time and often the doctors may need to interpret multiple results in order to provide you with the best plan of care or course of treatment. Therefore, we ask that you please give us  2 business days to thoroughly review all your results before contacting the office for clarification. Should we see a critical lab result, you will be contacted sooner.   If You Need Anything After Your Visit  If you have any questions or concerns for your doctor, please call our main line at [phone] and press option 4 to reach your doctor's medical assistant. If no one answers, please leave a voicemail as directed and we will return your call as soon as possible. Messages left after 4 pm will be answered the following business day.   You may also send us  a message via MyChart. We typically respond to MyChart messages within 1-2 business days.  For prescription refills, please ask your pharmacy to contact our office. Our fax number is [phone].  If you have an urgent issue when the clinic is closed that cannot wait until the next business day, you can  page your doctor at the number below.    Please note that while we do our best to be available for urgent issues outside of office hours, we are not available 24/7.   If you have an urgent issue and are unable to reach us , you may choose to seek medical care at your doctor's office, retail clinic, urgent care center, or emergency room.  If you have a medical emergency, please immediately call 911 or go to the emergency department.  Pager Numbers  - Dr. Hester: [phone]  - Dr. Jackquline: [phone]  - Dr. Claudene: [phone]   - Dr. Raymund: [phone]  In the event of inclement weather, please call our main line at [phone] for an update on the status of any delays or closures.  Dermatology Medication Tips: Please keep the boxes that topical medications come in in order to help keep track of the instructions about where and how to use these. Pharmacies typically print the medication instructions only on the boxes and not directly on the medication tubes.   If your medication is too expensive, please contact our office at [phone] option 4 or send us  a message through MyChart.   We are unable to tell what your co-pay for medications will be in advance as this is different depending on your insurance coverage. However, we may be able to find a substitute medication at lower cost or fill out paperwork to get insurance to cover a needed medication.   If a prior authorization is required to get your medication covered by your insurance company, please allow  us  1-2 business days to complete this process.  Drug prices often vary depending on where the prescription is filled and some pharmacies may offer cheaper prices.  The website www.goodrx.com contains coupons for medications through different pharmacies. The prices here do not account for what the cost may be with help from insurance (it may be cheaper with your insurance), but the website can give you the price if you did  not use any insurance.  - You can print the associated coupon and take it with your prescription to the pharmacy.  - You may also stop by our office during regular business hours and pick up a GoodRx coupon card.  - If you need your prescription sent electronically to a different pharmacy, notify our office through [facility] or by phone at [phone] option 4.     Si Usted Necesita Algo Despus de Su Visita  Tambin puede enviarnos un mensaje a travs de Clinical cytogeneticist. Por lo general respondemos a los mensajes de MyChart en el transcurso de 1 a 2 das hbiles.  Para renovar recetas, por favor pida a su farmacia que se ponga en contacto con nuestra oficina. Randi lakes de fax es [place] [phone].  Si tiene un asunto urgente cuando la clnica est cerrada y que no puede esperar hasta el siguiente da hbil, puede llamar/localizar a su doctor(a) al nmero que aparece a continuacin.   Por favor, tenga en cuenta que aunque hacemos todo lo posible para estar disponibles para asuntos urgentes fuera del horario de [place], no estamos disponibles las 24 horas del da, los 7 [address]  [address] de la [place].   Si tiene un problema urgente y no puede comunicarse con nosotros, puede optar por buscar atencin mdica  en el consultorio de su doctor(a), en una clnica privada, en un centro de atencin urgente o en una sala de emergencias.  Si tiene Engineer, drilling, por favor llame inmediatamente al 911 o vaya a la sala de emergencias.  Nmeros de bper  - Dr. Hester: [phone]  - Dra. Jackquline: [phone]  - Dr. Claudene: [phone]  - Dra. Kitts: [phone]  En caso de inclemencias del [place], por favor llame a nuestra lnea principal al [phone] para una actualizacin sobre el estado de cualquier retraso o cierre.  Consejos para la medicacin en dermatologa: Por favor, guarde las cajas en las que vienen los medicamentos de uso tpico para ayudarle a seguir las instrucciones sobre  dnde y cmo usarlos. Las farmacias generalmente imprimen las instrucciones del medicamento slo en las cajas y no directamente en los tubos del [place].   Si su medicamento es muy caro, por favor, pngase en contacto con landry rieger llamando al [phone] y presione la opcin 4 o envenos un mensaje a travs de Clinical cytogeneticist.   No podemos decirle cul ser su copago por los medicamentos por adelantado ya que esto es diferente dependiendo de la cobertura de su seguro. Sin embargo, es posible que podamos encontrar un medicamento sustituto a Audiological scientist un formulario para que el seguro cubra el medicamento que se considera necesario.   Si se requiere una autorizacin previa para que su compaa de seguros malta su medicamento, por favor permtanos de 1 a 2 das hbiles para completar este proceso.  Los precios de los medicamentos varan con frecuencia dependiendo del Environmental consultant de dnde se surte la receta y alguna farmacias pueden ofrecer precios ms baratos.  El sitio web www.goodrx.com tiene cupones para medicamentos de Health and safety inspector. Los precios aqu no tienen  en cuenta lo que podra costar con la ayuda del seguro (puede ser ms barato con su seguro), pero el sitio web puede darle el precio si no Visual merchandiser.  - Puede imprimir el cupn correspondiente y llevarlo con su receta a la farmacia.  - Tambin puede pasar por nuestra oficina durante el horario de atencin regular y Education officer, museum una tarjeta de cupones de GoodRx.  - Si necesita que su receta se enve electrnicamente a una farmacia diferente, informe a nuestra oficina a travs de MyChart de [place] o por telfono llamando al [phone] y presione la opcin 4.

## 2023-12-30 NOTE — Progress Notes (Signed)
   Follow-Up Visit   Subjective  Mariah Hogan is a 73 y.o. female who presents for the following: 2 week follow up on chronic idiopathic urticaria with pruritus. Started Dupixent  11/16/2023. Denies itching.   The following portions of the chart were reviewed this encounter and updated as appropriate: medications, allergies, medical history  Review of Systems:  No other skin or systemic complaints except as noted in HPI or Assessment and Plan.  Objective  Well appearing patient in no apparent distress; mood and affect are within normal limits.  A focused examination was performed of the following areas: Back, scalp, arms  Relevant exam findings are noted in the Assessment and Plan.   Assessment & Plan   URTICARIA with PRURITUS Improved today and clear after 6 weeks on Dupixent  Chronic idiopathic urticaria   Vs urticarial medication reaction from Lyrica (pt is off Lyrica) Started Dupixent  11/16/23 Back, scalp Exam: mildly hyperpigmented patches at back Chronic and persistent condition with duration or expected duration over one year. Condition is improving with treatment but not currently at goal.  Urticaria or hives is a pink to red patchy whelp- like rash of the skin that typically itches and it is the result of histamine release in the skin.   Hives may have multiple causes including stress, medications, infections, and systemic illness.  Sometimes there is a family history of chronic urticaria.   Physical urticarias may be caused by pressure (dermatographism), heat, sun, cold, vibration.  Insect bites can cause papular urticaria. It is often difficult to find the cause of generalized hives.  Statistically, 70% of the time a cause of generalized hives is not found.  Sometimes hives can spontaneously resolve. Other times hives can persist and when it does, and no cause is found, and it has been at least 6 weeks since started, it is called chronic idiopathic  urticaria. Antihistamines are the mainstay for treatment.  In severe cases Xolair injections may be used.    Treatment Plan: Cont Zyrtec 1 po bid  Will give final injection today since patient has improved. Patient will call to resume Dupixent  if flares in next 1-2 months.  Discussed Xolair and Rhapsido treatment options. Dupixent  300mg /77ml sq injections today to L anterior thigh  sample x 1  NDC: 9975-4084-79 Lot [id number] Exp: 12/31//2026  CHRONIC IDIOPATHIC URTICARIA   Related Medications Dupilumab  SOAJ 300 mg  PRURITUS   Related Medications Dupilumab  SOAJ 300 mg   Return for Follow Up As Scheduled.  I, Jill Parcell, CMA, am acting as scribe for Alm Rhyme, MD.   Documentation: I have reviewed the above documentation for accuracy and completeness, and I agree with the above.  Alm Rhyme, MD

## 2023-12-31 ENCOUNTER — Ambulatory Visit: Admitting: Physical Therapy

## 2023-12-31 DIAGNOSIS — M533 Sacrococcygeal disorders, not elsewhere classified: Secondary | ICD-10-CM

## 2023-12-31 DIAGNOSIS — M25611 Stiffness of right shoulder, not elsewhere classified: Secondary | ICD-10-CM

## 2023-12-31 DIAGNOSIS — G8929 Other chronic pain: Secondary | ICD-10-CM

## 2023-12-31 DIAGNOSIS — R2689 Other abnormalities of gait and mobility: Secondary | ICD-10-CM

## 2023-12-31 NOTE — Therapy (Signed)
 OUTPATIENT PHYSICAL THERAPY TREATMENT    Patient Name: Mariah Hogan MRN: [id number] DOB:[birth year], 73 y.o., female Today's Date: 12/31/2023   PT End of Session - 12/31/23 1416     Visit Number 4    Number of Visits 10    Date for Recertification  02/11/24    PT Start Time 1335    PT Stop Time 1415    PT Time Calculation (min) 40 min    Activity Tolerance Patient tolerated treatment well;No increased pain    Behavior During Therapy WFL for tasks assessed/performed          Past Medical History:  Diagnosis Date   Anemia    Arthritis    Asthma    H/O YEARS AGO-NOW HAS COUGHING SPELLS WHICH IS WHY SHE HAS HER ALBUTEROL  INHALER   Basal cell carcinoma    R nose ([facility])   Basal cell carcinoma 01/29/2016   Right forehead above med brow   Basal cell carcinoma 10/12/2019   L nasal tip - [facility] 05/15/2020   BRCA gene mutation negative 08/12/2017   Variant of unknown significance at APC only abnormality.   Breast cancer (HCC) 08/04/2017   1.8 cm ER 90%, PR 20%, HER-2/neu negative invasive mammary carcinoma of the left upper outer quadrant.  MammaPrint: High risk.   Cancer (HCC)    [phone] basal cell carcinoma   Complication of anesthesia    HARD TIME WAKING UP   Dysplastic nevus 02/21/2010   Right mid pretibial, mild   Dysrhythmia    TACHYCARDIA   GERD (gastroesophageal reflux disease)    History of hiatal hernia    SMALL   Hyperlipidemia    Hypertension    PT STATES IN 01-18-18 PREOP PHONE INTERVIEW THAT HER BP HAS BEEN ELEVATED SINCE STARTING ON VALSARTAN- PCP SWITCHED PT BACK TO MAXIDE ON 01-18-18 AND PT STATES THAT SHE HAS LOST 6-10 POUNDS OF FLUID SINCE RESTARTING MAXIDE.     Personal history of chemotherapy    PONV (postoperative nausea and vomiting)    Sjogren's syndrome 07/2020   Past Surgical History:  Procedure Laterality Date   ABDOMINAL HYSTERECTOMY  1998   BIOPSY  03/27/2023   Procedure: BIOPSY;  Surgeon: Maryruth Ole DASEN, MD;  Location: [facility]  ENDOSCOPY;  Service: Endoscopy;;   bladder tack  [phone]   BREAST BIOPSY Left 1990   BREAST BIOPSY Left 2018   core bx- neg   BREAST BIOPSY Left 08/04/2017   left UOQ 2 oclock INVASIVE DUCTAL CARCINOMA.   BREAST BIOPSY Right 08/2017   MRI biopsy, FIBROCYSTIC CHANGE AND USUAL DUCTAL HYPERPLASIA,, clip placed   BREAST BIOPSY Right 11/11/2023   MM RT BREAST BX W LOC DEV 1ST LESION IMAGE BX SPEC STEREO GUIDE 11/11/2023 [facility]   BREAST CYST EXCISION  x3   BREAST EXCISIONAL BIOPSY Bilateral    BREAST RECONSTRUCTION WITH PLACEMENT OF TISSUE EXPANDER AND FLEX HD (ACELLULAR HYDRATED DERMIS) Left 01/27/2018   Procedure: BREAST RECONSTRUCTION WITH PLACEMENT OF TISSUE EXPANDER AND FLEX HD (ACELLULAR HYDRATED DERMIS);  Surgeon: Lowery Estefana RAMAN, DO;  Location: [facility] ORS;  Service: Plastics;  Laterality: Left;   BREAST SURGERY Left 02/13/14   Fibrocystic changes, pseudo-angiomatous stromal hyperplasia. No atypia or malignancy.   CARPAL TUNNEL RELEASE Bilateral x2   CHOLECYSTECTOMY  2008   COLONOSCOPY WITH PROPOFOL  N/A 01/12/2015   Procedure: COLONOSCOPY WITH PROPOFOL ;  Surgeon: Lamar DASEN Holmes, MD;  Location: [facility] ENDOSCOPY;  Service: Endoscopy;  Laterality: N/A;   COLONOSCOPY WITH PROPOFOL  N/A 02/06/2020  Procedure: COLONOSCOPY WITH PROPOFOL ;  Surgeon: Maryruth Ole DASEN, MD;  Location: [facility]COPY;  Service: Endoscopy;  Laterality: N/A;   ESOPHAGEAL DILATION  03/27/2023   Procedure: ESOPHAGEAL DILATION;  Surgeon: Maryruth Ole DASEN, MD;  Location: [facility] ENDOSCOPY;  Service: Endoscopy;;   ESOPHAGOGASTRODUODENOSCOPY (EGD) WITH PROPOFOL  N/A 01/12/2015   Procedure: ESOPHAGOGASTRODUODENOSCOPY (EGD) WITH PROPOFOL ;  Surgeon: Lamar DASEN Holmes, MD;  Location[facility] ENDOSCOPY;  Service: Endoscopy;  Laterality: N/A;   ESOPHAGOGASTRODUODENOSCOPY (EGD) WITH PROPOFOL  N/A 02/06/2020   Procedure: ESOPHAGOGASTRODUODENOSCOPY (EGD) WITH PROPOFOL ;  Surgeon: Maryruth Ole DASEN, MD;  Location: [facility] ENDOSCOPY;   Service: Endoscopy;  Laterality: N/A;   ESOPHAGOGASTRODUODENOSCOPY (EGD) WITH PROPOFOL  N/A 03/27/2023   Procedure: ESOPHAGOGASTRODUODENOSCOPY (EGD) WITH PROPOFOL ;  Surgeon: Maryruth Ole DASEN, MD;  Location: [facility] ENDOSCOPY;  Service: Endoscopy;  Laterality: N/A;   LIPOSUCTION WITH LIPOFILLING Left 12/23/2018   Procedure: LIPOSUCTION WITH LIPOFILLING FROM ABDOMEN TO LEFT BREAST;  Surgeon: Lowery Estefana RAMAN, DO;  Location: [facility];  Service: Plastics;  Laterality: Left;  90 min, please   MASTECTOMY Left 2019   [facility]   MASTECTOMY W/ SENTINEL NODE BIOPSY Left 01/27/2018   ypT1c ypN0; ER 90%, PR 20%, HER-2/neu not overexpressed.  MASTECTOMY WITH SENTINEL LYMPH NODE BIOPSY;  Surgeon: Dessa Reyes ORN, MD;  Location: [facility] ORS;  Service: General;  Laterality: Left;   MINOR BREAST BIOPSY Right 09/02/2017   Radiology perform procedure, fibrocystic changes right retroareolar area.   NASAL RECONSTRUCTION  1983   OOPHORECTOMY     POLYPECTOMY  03/27/2023   Procedure: POLYPECTOMY;  Surgeon: Maryruth Ole DASEN, MD;  Location: [facility] ENDOSCOPY;  Service: Endoscopy;;   PORT-A-CATH REMOVAL     PORTACATH PLACEMENT N/A 10/21/2017   Procedure: INSERTION PORT-A-CATH;  Surgeon: Dessa Reyes ORN, MD;  Location: [facility] ORS;  Service: General;  Laterality: N/A;   REMOVAL OF TISSUE EXPANDER AND PLACEMENT OF IMPLANT Left 05/13/2018   Procedure: REMOVAL OF TISSUE EXPANDER AND PLACEMENT OF IMPLANT;  Surgeon: Lowery Estefana RAMAN, DO;  Location: [facility] ORS;  Service: Plastics;  Laterality: Left;  total surgery time should be 3 hours, per provider   REVERSE SHOULDER ARTHROPLASTY Right 11/27/2020   Procedure: REVERSE SHOULDER ARTHROPLASTY;  Surgeon: Edie Norleen PARAS, MD;  Location: [facility] ORS;  Service: Orthopedics;  Laterality: Right;   SAVORY DILATION N/A 01/12/2015   Procedure: SAVORY DILATION;  Surgeon: Lamar DASEN Holmes, MD;  Location: [facility] ENDOSCOPY;  Service: Endoscopy;  Laterality: N/A;   TONSILLECTOMY AND  ADENOIDECTOMY  1956   Patient Active Problem List   Diagnosis Date Noted   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   Status post reverse arthroplasty of shoulder, right 11/27/2020   Carcinoma of overlapping sites of left breast in female, estrogen receptor positive (HCC) 07/30/2020   Personal history of other malignant neoplasm of skin 11/17/2019   Complex tear of medial meniscus of left knee as current injury 08/10/2019   Primary osteoarthritis of left knee 08/10/2019   S/P breast reconstruction, left 04/15/2019   Acquired absence of left breast 12/23/2018   Breast asymmetry following reconstructive surgery 08/31/2018   Acquired absence of breast 02/09/2018   S/P mastectomy, left 02/02/2018   Breast cancer (HCC) 01/27/2018   Thrush 11/05/2017   Goals of care, counseling/discussion 10/09/2017   Hypokalemia 10/09/2017   Acute renal failure (ARF) 09/11/2017   Diarrhea 09/11/2017   Encounter for antineoplastic chemotherapy 09/03/2017   Nodule of upper lobe of right lung 08/24/2017   Osteopenia 08/24/2017   Malignant neoplasm of left female breast (HCC) 08/07/2017  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   Incomplete emptying of bladder 09/03/2016   SUI (stress urinary incontinence, female) 09/03/2016   Mass of right breast 02/14/2014   Mass of upper outer quadrant of left breast 01/20/2014   Arthritis 12/28/2013   Asthma without status asthmaticus 12/28/2013   Esophageal reflux 12/28/2013   Hyperlipidemia 12/28/2013   Hypertension 05/09/2003    PCP: [facility]   REFERRING PROVIDER: Stoiff   REFERRING DIAG: Overactive bladder with urge incontinence/stress incontinence for pelvic floor PT   Rationale for Evaluation and Treatment Rehabilitation  THERAPY DIAG:  Sacrococcygeal disorders, not elsewhere classified  Other abnormalities of gait and mobility  Chronic pain of left knee  Stiffness of right shoulder, not elsewhere classified  ONSET DATE:    SUBJECTIVE:       SUBJECTIVE STATEMENT TODAY: Pt had question about last week's exercise                                                                                                                        SUBJECTIVE STATEMENT ON EVAL 12/03/23 : 1) Urinary Leakage:  pt doe snot wear pads. Pt was going through 5-6 pads before she started urinary medication.  Now pt has urgency. Pt has leakage with bending up yard work .  Nocturia 2-3 x / night    2) L knee pain - Pt was suppose to get L TKA but then she held it off because she had get R shoulder surgery. Pt used to walk 3 miles across 4-5 x a week but she has stopped due to pain. 5/10 level  with going up stairs,  getting into SUVs, bending   3) R shoulder stiffness:  Pt had total shoulder reverse placement and underwent PT. Pt is still limited in not raising as high and behind her back like she used to. Pt has dropped a few things. Wiping bowel movements are also affected by her limitation with reaching hand behind back    PERTINENT HISTORY:  L mastectomy with breat implant for breast CA R shoulder surgery  Gall bladder removal  3 Bladder slings surgeries Hysterectomy Basal cell cancer    PAIN:  Are you having pain? Yes: see above  PRECAUTIONS: No  WEIGHT BEARING RESTRICTIONS: No   FALLS:  Has patient fallen in last 6 months? No   LIVING ENVIRONMENT: Lives with: alone  Lives in: one story  Stairs: 4 STE with rail   OCCUPATION: retired Psychologist, occupational ,    PLOF: IND   PATIENT GOALS:   Improve leakage, R shoulder ROM, and decrease L knee pain and return walking routine     OBJECTIVE:     [facility] PT Assessment - 12/31/23 1633       Palpation   Spinal mobility limited cervical L sideflexion in supine > R    SI assessment  standing: pelvis levelled / R shoulder higher,  seated:  R shoulder higher    Palpation comment convex curve T10-12, L2  L, tightness along intercostals, interspinals, paraspinals, L , occiput  tightness. scalenes, playtsmus, SCM L          [facility] Adult PT Treatment/Exercise - 12/31/23 1634       Neuro Re-ed    Neuro Re-ed Details  propioception and alignment cues  for cervical ROM, praspinal stretch   Excessive propicoeption cues for scooting in bed without lifting head to minimize pelvic floor      Modalities   Modalities Moist Heat      Moist Heat Therapy   Number Minutes Moist Heat 5 Minutes    Moist Heat Location --   L thoracic, lowere trunk rotation to lengthen L C/T junction     Manual Therapy   Manual therapy comments distraction at occiput, STM/MWM            HOME EXERCISE PROGRAM: See pt instruction section    ASSESSMENT:  CLINICAL IMPRESSION:  Pt showed good carry over with pelvic girdle alignment.      Manual Tx was applied again with focus to realign C/ T junction  to minimize curves and realign shoulders and optimize diaphragm excursion.  Added cervical / paraspinal mobility HEP.   Provided propioception and alignment cues  for cervical ROM, praspinal stretch   Provided excessive propicoeption cues for scooting in bed without lifting head to minimize pelvic floor    More manual Tx will still be needed next session to realign C/T junction due to limited mobility in sideflexion and rotation of cervical spine.   Anticipate these improvements will help with R shoulder stiffness, optimize deep core function for pelvic floor , and L knee pain.   Plan to address realignment of spine/ pelvis at next session to help promote optimize IAP system for improved pelvic floor function, trunk stability, gait, balance, stabilization with mobility tasks.  Plan to address pelvic floor issues once pelvis and spine are realigned to yield better outcomes.                                                     Pt benefits from skilled PT.    OBJECTIVE IMPAIRMENTS decreased activity tolerance, decreased coordination, decreased endurance, decreased mobility, difficulty  walking, decreased ROM, decreased strength, decreased safety awareness, hypomobility, increased muscle spasms, impaired flexibility, improper body mechanics, postural dysfunction, and pain, scar restrictions   ACTIVITY LIMITATIONS  self-care, home chores, community activities    PARTICIPATION LIMITATIONS:  community,  walking activities    PERSONAL FACTORS   affecting patient's functional outcome:    REHAB POTENTIAL: Good   CLINICAL DECISION MAKING: Evolving/moderate complexity   EVALUATION COMPLEXITY: Moderate    PATIENT EDUCATION:    Education details: Showed pt anatomy images. Explained muscles attachments/ connection, physiology of deep core system/ spinal- thoracic-pelvis-lower kinetic chain as they relate to pt's presentation, Sx, and past Hx. Explained what and how these areas of deficits need to be restored to balance and function    See Therapeutic activity / neuromuscular re-education section  Answered pt's questions.   Person educated: Patient Education method: Explanation, Demonstration, Tactile cues, Verbal cues, and Handouts Education comprehension: verbalized understanding, returned demonstration, verbal cues required, tactile cues required, and needs further education     PLAN: PT FREQUENCY: 1x/week   PT DURATION: 10 weeks   PLANNED INTERVENTIONS:   Gait training;Stair training;Functional mobility training;DME Instruction;Therapeutic  activities;Therapeutic exercise;Balance training;Neuromuscular re-education;Patient/family education;Vestibular;Visual/perceptual remediation/compensation;Passive range of motion;Moist Heat;Cryotherapy;Traction;Canalith Repostioning;Joint Manipulations;Manual lymph drainage;Manual techniques;Scar mobilization;Energy conservation;Dry needling;ADLs/Self Care Home Management;Biofeedback;Electrical Stimulation;Taping    PLAN FOR NEXT SESSION: See clinical impression for plan     GOALS: Goals reviewed with patient? Yes  SHORT TERM  GOALS: Target date: 12/31/2023    Pt will demo IND with HEP                    Baseline: Not IND            Goal status: INITIAL   LONG TERM GOALS: Target date: 02/11/2024    1.Pt will demo proper deep core coordination without chest breathing and optimal excursion of diaphragm/pelvic floor in order to  improve urgency and leakage with bending up yard work . Baseline: dyscoordination Goal status: INITIAL  2.  Pt will demo proper body mechanics in against gravity tasks and ADLs  work tasks, fitness  to minimize straining pelvic floor / back    Baseline: not IND, improper form that places strain on pelvic floor  Goal status: INITIAL    3. Pt will demo increased gait speed > 1.3 m/s with reciprocal gait pattern, longer stride length  in order to ambulate safely in community and build back up  to regular walking routine of 3 miles - 4-5 x a week  Baseline: decreased R stance, limited posterior rotation of R pelvis , 1.04 m/s  Goal status: INITIAL    4. Pt will demo levelled pelvic girdle and shoulder height in order to progress to deep core strengthening HEP and restore mobility at spine, pelvis, gait, posture minimize falls, and improve balance  Baseline: L shoulder and L iliac crest lowered  Goal status: INITIAL   5. Pt will improve PFDI-7 questionnaire to  pts  score change  to demo improved QOL  Baseline:    ( greater pts indicate greater negative impact on QOL)     pts  ( total)    pts  ( UIQ-7 )    pts  ( CRAIQ-7 )    pts  ( POPIQ-7 )  Baseline:  Goal status: INITIAL  6. Pt will improve AROM with reach behind back ( R thumb above level of L5) to be able to reeach behind to clean after BMs and shoulder flexion > 130 deg ) for reaching above cabinets  Baseline:  reach hand behind back: R thumb at PSIS level, L T 5 level   shoulder flexion seated: 120 deg R, 160 deg L   Wiping bowel movements are also affected by her limitation with reaching hand behind back     Reaching up to cabinets   Goal Status: INITIAL:    7.  Pt will communicate with pulmonologist for sleep study to rule in / out OSA   Baseline:      Nocturia 2-3 x / night  Goal Status: MET Pt talked to pulmonologist and he did not recommend a sleep study   Pia Lupe Plump, PT 12/31/2023, 2:16 PM

## 2023-12-31 NOTE — Patient Instructions (Signed)
  Stretches :   Neck / shoulder stretches:    Lying on back - small sushi roll towel under neck  _ 6 directions   Chin up, down Rotation like changing lanes when driving Ear to shoulder like puppy dog   10 reps    _angel wings, lower elbows down , keep arms touching bed  10 reps    ____________  ZigZag stretch  Reclined twist for hips and side of the hips/ legs  Lay on your back, knees bend Scoot hips to the R , leave shoulders in place Wobble knees to the L side 45 deg and to midline  10 reps

## 2024-01-04 ENCOUNTER — Ambulatory Visit: Admitting: Dermatology

## 2024-01-07 ENCOUNTER — Ambulatory Visit: Admitting: Physical Therapy

## 2024-01-07 DIAGNOSIS — M533 Sacrococcygeal disorders, not elsewhere classified: Secondary | ICD-10-CM | POA: Diagnosis not present

## 2024-01-07 DIAGNOSIS — M25611 Stiffness of right shoulder, not elsewhere classified: Secondary | ICD-10-CM

## 2024-01-07 DIAGNOSIS — R2689 Other abnormalities of gait and mobility: Secondary | ICD-10-CM

## 2024-01-07 DIAGNOSIS — G8929 Other chronic pain: Secondary | ICD-10-CM

## 2024-01-07 NOTE — Therapy (Signed)
 OUTPATIENT PHYSICAL THERAPY TREATMENT    Patient Name: Mariah Hogan MRN: [id number] DOB:05-Dec-1950, 73 y.o., female Today's Date: 01/07/2024   PT End of Session - 01/07/24 1336     Visit Number 5    Number of Visits 10    Date for Recertification  02/11/24    PT Start Time 1332    PT Stop Time 1415    PT Time Calculation (min) 43 min    Activity Tolerance Patient tolerated treatment well;No increased pain    Behavior During Therapy WFL for tasks assessed/performed          Past Medical History:  Diagnosis Date   Anemia    Arthritis    Asthma    H/O YEARS AGO-NOW HAS COUGHING SPELLS WHICH IS WHY SHE HAS HER ALBUTEROL  INHALER   Basal cell carcinoma    R nose ([facility])   Basal cell carcinoma 01/29/2016   Right forehead above med brow   Basal cell carcinoma 10/12/2019   L nasal tip - MOHS 05/15/2020   BRCA gene mutation negative 08/12/2017   Variant of unknown significance at APC only abnormality.   Breast cancer (HCC) 08/04/2017   1.8 cm ER 90%, PR 20%, HER-2/neu negative invasive mammary carcinoma of the left upper outer quadrant.  MammaPrint: High risk.   Cancer (HCC)    [phone] basal cell carcinoma   Complication of anesthesia    HARD TIME WAKING UP   Dysplastic nevus 02/21/2010   Right mid pretibial, mild   Dysrhythmia    TACHYCARDIA   GERD (gastroesophageal reflux disease)    History of hiatal hernia    SMALL   Hyperlipidemia    Hypertension    PT STATES IN 01-18-18 PREOP PHONE INTERVIEW THAT HER BP HAS BEEN ELEVATED SINCE STARTING ON VALSARTAN- PCP SWITCHED PT BACK TO MAXIDE ON 01-18-18 AND PT STATES THAT SHE HAS LOST 6-10 POUNDS OF FLUID SINCE RESTARTING MAXIDE.     Personal history of chemotherapy    PONV (postoperative nausea and vomiting)    Sjogren's syndrome 07/2020   Past Surgical History:  Procedure Laterality Date   ABDOMINAL HYSTERECTOMY  1998   BIOPSY  03/27/2023   Procedure: BIOPSY;  Surgeon: Maryruth Ole DASEN, MD;  Location: [facility]  ENDOSCOPY;  Service: Endoscopy;;   bladder tack  [phone]   BREAST BIOPSY Left 1990   BREAST BIOPSY Left 2018   core bx- neg   BREAST BIOPSY Left 08/04/2017   left UOQ 2 oclock INVASIVE DUCTAL CARCINOMA.   BREAST BIOPSY Right 08/2017   MRI biopsy, FIBROCYSTIC CHANGE AND USUAL DUCTAL HYPERPLASIA,, clip placed   BREAST BIOPSY Right 11/11/2023   MM RT BREAST BX W LOC DEV 1ST LESION IMAGE BX SPEC STEREO GUIDE 11/11/2023 [facility]   BREAST CYST EXCISION  x3   BREAST EXCISIONAL BIOPSY Bilateral    BREAST RECONSTRUCTION WITH PLACEMENT OF TISSUE EXPANDER AND FLEX HD (ACELLULAR HYDRATED DERMIS) Left 01/27/2018   Procedure: BREAST RECONSTRUCTION WITH PLACEMENT OF TISSUE EXPANDER AND FLEX HD (ACELLULAR HYDRATED DERMIS);  Surgeon: Lowery Estefana RAMAN, DO;  Location: [facility] ORS;  Service: Plastics;  Laterality: Left;   BREAST SURGERY Left 02/13/14   Fibrocystic changes, pseudo-angiomatous stromal hyperplasia. No atypia or malignancy.   CARPAL TUNNEL RELEASE Bilateral x2   CHOLECYSTECTOMY  2008   COLONOSCOPY WITH PROPOFOL  N/A 01/12/2015   Procedure: COLONOSCOPY WITH PROPOFOL ;  Surgeon: Lamar DASEN Holmes, MD;  Location: [facility] ENDOSCOPY;  Service: Endoscopy;  Laterality: N/A;   COLONOSCOPY WITH PROPOFOL  N/A 02/06/2020  Procedure: COLONOSCOPY WITH PROPOFOL ;  Surgeon: Maryruth Ole DASEN, MD;  Location: [facility] ENDOSCOPY;  Service: Endoscopy;  Laterality: N/A;   ESOPHAGEAL DILATION  03/27/2023   Procedure: ESOPHAGEAL DILATION;  Surgeon: Maryruth Ole DASEN, MD;  Location: [facility] ENDOSCOPY;  Service: Endoscopy;;   ESOPHAGOGASTRODUODENOSCOPY (EGD) WITH PROPOFOL  N/A 01/12/2015   Procedure: ESOPHAGOGASTRODUODENOSCOPY (EGD) WITH PROPOFOL ;  Surgeon: Lamar DASEN Holmes, MD;  Location: [facility] ENDOSCOPY;  Service: Endoscopy;  Laterality: N/A;   ESOPHAGOGASTRODUODENOSCOPY (EGD) WITH PROPOFOL  N/A 02/06/2020   Procedure: ESOPHAGOGASTRODUODENOSCOPY (EGD) WITH PROPOFOL ;  Surgeon: Maryruth Ole DASEN, MD;  Location: [facility] ENDOSCOPY;   Service: Endoscopy;  Laterality: N/A;   ESOPHAGOGASTRODUODENOSCOPY (EGD) WITH PROPOFOL  N/A 03/27/2023   Procedure: ESOPHAGOGASTRODUODENOSCOPY (EGD) WITH PROPOFOL ;  Surgeon: Maryruth Ole DASEN, MD;  Location: [facility] ENDOSCOPY;  Service: Endoscopy;  Laterality: N/A;   LIPOSUCTION WITH LIPOFILLING Left 12/23/2018   Procedure: LIPOSUCTION WITH LIPOFILLING FROM ABDOMEN TO LEFT BREAST;  Surgeon: Lowery Estefana RAMAN, DO;  Location: [facility];  Service: Plastics;  Laterality: Left;  90 min, please   MASTECTOMY Left 2019   [facility]   MASTECTOMY W/ SENTINEL NODE BIOPSY Left 01/27/2018   ypT1c ypN0; ER 90%, PR 20%, HER-2/neu not overexpressed.  MASTECTOMY WITH SENTINEL LYMPH NODE BIOPSY;  Surgeon: Dessa Reyes ORN, MD;  Location: [facility] ORS;  Service: General;  Laterality: Left;   MINOR BREAST BIOPSY Right 09/02/2017   Radiology perform procedure, fibrocystic changes right retroareolar area.   NASAL RECONSTRUCTION  1983   OOPHORECTOMY     POLYPECTOMY  03/27/2023   Procedure: POLYPECTOMY;  Surgeon: Maryruth Ole DASEN, MD;  Location: [facility] ENDOSCOPY;  Service: Endoscopy;;   PORT-A-CATH REMOVAL     PORTACATH PLACEMENT N/A 10/21/2017   Procedure: INSERTION PORT-A-CATH;  Surgeon: Dessa Reyes ORN, MD;  Location: [facility] ORS;  Service: General;  Laterality: N/A;   REMOVAL OF TISSUE EXPANDER AND PLACEMENT OF IMPLANT Left 05/13/2018   Procedure: REMOVAL OF TISSUE EXPANDER AND PLACEMENT OF IMPLANT;  Surgeon: Lowery Estefana RAMAN, DO;  Location: [facility] ORS;  Service: Plastics;  Laterality: Left;  total surgery time should be 3 hours, per provider   REVERSE SHOULDER ARTHROPLASTY Right 11/27/2020   Procedure: REVERSE SHOULDER ARTHROPLASTY;  Surgeon: Edie Norleen PARAS, MD;  Location: [facility] ORS;  Service: Orthopedics;  Laterality: Right;   SAVORY DILATION N/A 01/12/2015   Procedure: SAVORY DILATION;  Surgeon: Lamar DASEN Holmes, MD;  Location: [facility] ENDOSCOPY;  Service: Endoscopy;  Laterality: N/A;   TONSILLECTOMY AND  ADENOIDECTOMY  1956   Patient Active Problem List   Diagnosis Date Noted   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   Status post reverse arthroplasty of shoulder, right 11/27/2020   Carcinoma of overlapping sites of left breast in female, estrogen receptor positive (HCC) 07/30/2020   Personal history of other malignant neoplasm of skin 11/17/2019   Complex tear of medial meniscus of left knee as current injury 08/10/2019   Primary osteoarthritis of left knee 08/10/2019   S/P breast reconstruction, left 04/15/2019   Acquired absence of left breast 12/23/2018   Breast asymmetry following reconstructive surgery 08/31/2018   Acquired absence of breast 02/09/2018   S/P mastectomy, left 02/02/2018   Breast cancer (HCC) 01/27/2018   Thrush 11/05/2017   Goals of care, counseling/discussion 10/09/2017   Hypokalemia 10/09/2017   Acute renal failure (ARF) 09/11/2017   Diarrhea 09/11/2017   Encounter for antineoplastic chemotherapy 09/03/2017   Nodule of upper lobe of right lung 08/24/2017   Osteopenia 08/24/2017   Malignant neoplasm of left female breast (HCC) 08/07/2017  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   Incomplete emptying of bladder 09/03/2016   SUI (stress urinary incontinence, female) 09/03/2016   Mass of right breast 02/14/2014   Mass of upper outer quadrant of left breast 01/20/2014   Arthritis 12/28/2013   Asthma without status asthmaticus 12/28/2013   Esophageal reflux 12/28/2013   Hyperlipidemia 12/28/2013   Hypertension 05/09/2003    PCP: [facility]   REFERRING PROVIDER: Stoiff   REFERRING DIAG: Overactive bladder with urge incontinence/stress incontinence for pelvic floor PT   Rationale for Evaluation and Treatment Rehabilitation  THERAPY DIAG:  Sacrococcygeal disorders, not elsewhere classified  Other abnormalities of gait and mobility  Chronic pain of left knee  Stiffness of right shoulder, not elsewhere classified  ONSET DATE:    SUBJECTIVE:       SUBJECTIVE STATEMENT TODAY: Pt had question about last week's exercise                                                                                                                        SUBJECTIVE STATEMENT ON EVAL 12/03/23 : 1) Urinary Leakage:  pt doe snot wear pads. Pt was going through 5-6 pads before she started urinary medication.  Now pt has urgency. Pt has leakage with bending up yard work .  Nocturia 2-3 x / night    2) L knee pain - Pt was suppose to get L TKA but then she held it off because she had get R shoulder surgery. Pt used to walk 3 miles across 4-5 x a week but she has stopped due to pain. 5/10 level  with going up stairs,  getting into SUVs, bending   3) R shoulder stiffness:  Pt had total shoulder reverse placement and underwent PT. Pt is still limited in not raising as high and behind her back like she used to. Pt has dropped a few things. Wiping bowel movements are also affected by her limitation with reaching hand behind back    PERTINENT HISTORY:  L mastectomy with breat implant for breast CA R shoulder surgery  Gall bladder removal  3 Bladder slings surgeries Hysterectomy Basal cell cancer    PAIN:  Are you having pain? Yes: see above  PRECAUTIONS: No  WEIGHT BEARING RESTRICTIONS: No   FALLS:  Has patient fallen in last 6 months? No   LIVING ENVIRONMENT: Lives with: alone  Lives in: one story  Stairs: 4 STE with rail   OCCUPATION: retired psychologist, occupational ,    PLOF: IND   PATIENT GOALS:   Improve leakage, R shoulder ROM, and decrease L knee pain and return walking routine     OBJECTIVE:    [facility] PT Assessment - 01/07/24 1734       Palpation   SI assessment  L shoulder lowered, pelvis levelled    Palpation comment C2-7 deviated to L, tightness along interspinals, SCM, platysmus, scalenes          [facility] Adult PT Treatment/Exercise - 01/07/24 1732  Neuro Re-ed    Neuro Re-ed Details  propioception and alignment  cues  for last week's stretches      Modalities   Modalities Moist Heat      Moist Heat Therapy   Number Minutes Moist Heat 5 Minutes    Moist Heat Location --   cervical ( guided relaxation/ noticing diaphragm breathing     Manual Therapy   Manual therapy comments distraction at occiput, STM/MWM to realign cervicl segments, lengthening of mm            HOME EXERCISE PROGRAM: See pt instruction section    ASSESSMENT:  CLINICAL IMPRESSION:  Pt showed good carry over with pelvic girdle alignment.      Manual Tx was applied again with focus to realign C/ T junction  on L side today to minimize curves and realign shoulders and optimize diaphragm excursion.  Pt required cues for correct technique for last week's HEP for  cervical mobility.    More manual Tx will still be needed next session to realign C/T junction due to limited mobility in sideflexion and rotation of cervical spine.   Plan to add cervicoscapular stabilization next session  Anticipate these improvements will help with R shoulder stiffness, optimize deep core function for pelvic floor , and L knee pain.   Plan to address realignment of spine/ pelvis at next session to help promote optimize IAP system for improved pelvic floor function, trunk stability, gait, balance, stabilization with mobility tasks.  Plan to address pelvic floor issues once pelvis and spine are realigned to yield better outcomes.                                                     Pt benefits from skilled PT.    OBJECTIVE IMPAIRMENTS decreased activity tolerance, decreased coordination, decreased endurance, decreased mobility, difficulty walking, decreased ROM, decreased strength, decreased safety awareness, hypomobility, increased muscle spasms, impaired flexibility, improper body mechanics, postural dysfunction, and pain, scar restrictions   ACTIVITY LIMITATIONS  self-care, home chores, community activities    PARTICIPATION LIMITATIONS:   community,  walking activities    PERSONAL FACTORS   affecting patient's functional outcome:    REHAB POTENTIAL: Good   CLINICAL DECISION MAKING: Evolving/moderate complexity   EVALUATION COMPLEXITY: Moderate    PATIENT EDUCATION:    Education details: Showed pt anatomy images. Explained muscles attachments/ connection, physiology of deep core system/ spinal- thoracic-pelvis-lower kinetic chain as they relate to pt's presentation, Sx, and past Hx. Explained what and how these areas of deficits need to be restored to balance and function    See Therapeutic activity / neuromuscular re-education section  Answered pt's questions.   Person educated: Patient Education method: Explanation, Demonstration, Tactile cues, Verbal cues, and Handouts Education comprehension: verbalized understanding, returned demonstration, verbal cues required, tactile cues required, and needs further education     PLAN: PT FREQUENCY: 1x/week   PT DURATION: 10 weeks   PLANNED INTERVENTIONS:   Gait training;Stair training;Functional mobility training;DME Instruction;Therapeutic activities;Therapeutic exercise;Balance training;Neuromuscular re-education;Patient/family education;Vestibular;Visual/perceptual remediation/compensation;Passive range of motion;Moist Heat;Cryotherapy;Traction;Canalith Repostioning;Joint Manipulations;Manual lymph drainage;Manual techniques;Scar mobilization;Energy conservation;Dry needling;ADLs/Self Care Home Management;Biofeedback;Electrical Stimulation;Taping    PLAN FOR NEXT SESSION: See clinical impression for plan     GOALS: Goals reviewed with patient? Yes  SHORT TERM GOALS: Target date: 12/31/2023    Pt will demo IND with  HEP                    Baseline: Not IND            Goal status: INITIAL   LONG TERM GOALS: Target date: 02/11/2024    1.Pt will demo proper deep core coordination without chest breathing and optimal excursion of diaphragm/pelvic floor in order  to  improve urgency and leakage with bending up yard work . Baseline: dyscoordination Goal status: INITIAL  2.  Pt will demo proper body mechanics in against gravity tasks and ADLs  work tasks, fitness  to minimize straining pelvic floor / back    Baseline: not IND, improper form that places strain on pelvic floor  Goal status: INITIAL    3. Pt will demo increased gait speed > 1.3 m/s with reciprocal gait pattern, longer stride length  in order to ambulate safely in community and build back up  to regular walking routine of 3 miles - 4-5 x a week  Baseline: decreased R stance, limited posterior rotation of R pelvis , 1.04 m/s  Goal status: INITIAL    4. Pt will demo levelled pelvic girdle and shoulder height in order to progress to deep core strengthening HEP and restore mobility at spine, pelvis, gait, posture minimize falls, and improve balance  Baseline: L shoulder and L iliac crest lowered  Goal status: INITIAL   5. Pt will improve PFDI-7 questionnaire to  pts  score change  to demo improved QOL  Baseline:    ( greater pts indicate greater negative impact on QOL)     pts  ( total)    pts  ( UIQ-7 )    pts  ( CRAIQ-7 )    pts  ( POPIQ-7 )  Baseline:  Goal status: INITIAL  6. Pt will improve AROM with reach behind back ( R thumb above level of L5) to be able to reeach behind to clean after BMs and shoulder flexion > 130 deg ) for reaching above cabinets  Baseline:  reach hand behind back: R thumb at PSIS level, L T 5 level   shoulder flexion seated: 120 deg R, 160 deg L   Wiping bowel movements are also affected by her limitation with reaching hand behind back    Reaching up to cabinets   Goal Status: INITIAL:    7.  Pt will communicate with pulmonologist for sleep study to rule in / out OSA   Baseline:      Nocturia 2-3 x / night  Goal Status: MET Pt talked to pulmonologist and he did not recommend a sleep study   Pia Lupe Plump, PT 01/07/2024, 5:43 PM

## 2024-01-14 ENCOUNTER — Encounter: Admitting: Physical Therapy

## 2024-01-21 ENCOUNTER — Other Ambulatory Visit: Payer: Self-pay | Admitting: *Deleted

## 2024-01-21 ENCOUNTER — Ambulatory Visit: Attending: Urology | Admitting: Physical Therapy

## 2024-01-21 DIAGNOSIS — M533 Sacrococcygeal disorders, not elsewhere classified: Secondary | ICD-10-CM | POA: Insufficient documentation

## 2024-01-21 DIAGNOSIS — M25562 Pain in left knee: Secondary | ICD-10-CM | POA: Insufficient documentation

## 2024-01-21 DIAGNOSIS — R2689 Other abnormalities of gait and mobility: Secondary | ICD-10-CM | POA: Diagnosis present

## 2024-01-21 DIAGNOSIS — M25611 Stiffness of right shoulder, not elsewhere classified: Secondary | ICD-10-CM | POA: Insufficient documentation

## 2024-01-21 DIAGNOSIS — G8929 Other chronic pain: Secondary | ICD-10-CM | POA: Insufficient documentation

## 2024-01-21 MED ORDER — GEMTESA 75 MG PO TABS: 75.0000 mg | ORAL_TABLET | Freq: Every day | ORAL | 3 refills | Status: AC

## 2024-01-21 NOTE — Patient Instructions (Addendum)
  ZigZag stretch  Reclined twist for hips and side of the hips/ legs  Lay on your back, knees bend, dig elbows and feet into bed to exhale and lift buttocks up to  Scoot hips and feet  to the L , leave shoulders in place Wobble knees to the R side 45 deg and to midline  10 reps   Then reach for R thigh with R hand cross body, straighten knee and point toes toward face, bend knee and repeat straigthen 10 reps to lengthen hamstring and ITband (side of hip)   Repeat on other side: Scoot hip and feet  R ,  leave shoulders in place Wobble knees to the L side 45 deg and to midline  10 reps   Then reach for L thigh with R hand cross body, straighten knee and point toes toward face, bend knee and repeat straigthen 10 reps to lengthen hamstring and ITband (side of hip)    ___  [place] wings:  with slower and lower elbows and shoulders down and relaxed

## 2024-01-21 NOTE — Therapy (Signed)
 OUTPATIENT PHYSICAL THERAPY TREATMENT    Patient Name: Mariah Hogan MRN: [id number] DOB:March 05, 1951, 73 y.o., female Today's Date: 01/21/2024   PT End of Session - 01/21/24 1337     Visit Number 6    Number of Visits 10    Date for Recertification  02/11/24    PT Start Time 1330    PT Stop Time 1415    PT Time Calculation (min) 45 min    Activity Tolerance Patient tolerated treatment well;No increased pain    Behavior During Therapy WFL for tasks assessed/performed          Past Medical History:  Diagnosis Date   Anemia    Arthritis    Asthma    H/O YEARS AGO-NOW HAS COUGHING SPELLS WHICH IS WHY SHE HAS HER ALBUTEROL  INHALER   Basal cell carcinoma    R nose ([facility])   Basal cell carcinoma 01/29/2016   Right forehead above med brow   Basal cell carcinoma 10/12/2019   L nasal tip - [facility] 05/15/2020   BRCA gene mutation negative 08/12/2017   Variant of unknown significance at APC only abnormality.   Breast cancer (HCC) 08/04/2017   1.8 cm ER 90%, PR 20%, HER-2/neu negative invasive mammary carcinoma of the left upper outer quadrant.  MammaPrint: High risk.   Cancer (HCC)    [id number] basal cell carcinoma   Complication of anesthesia    HARD TIME WAKING UP   Dysplastic nevus 02/21/2010   Right mid pretibial, mild   Dysrhythmia    TACHYCARDIA   GERD (gastroesophageal reflux disease)    History of hiatal hernia    SMALL   Hyperlipidemia    Hypertension    PT STATES IN 01-18-18 PREOP PHONE INTERVIEW THAT HER BP HAS BEEN ELEVATED SINCE STARTING ON VALSARTAN- PCP SWITCHED PT BACK TO MAXIDE ON 01-18-18 AND PT STATES THAT SHE HAS LOST 6-10 POUNDS OF FLUID SINCE RESTARTING MAXIDE.     Personal history of chemotherapy    PONV (postoperative nausea and vomiting)    Sjogren's syndrome 07/2020   Past Surgical History:  Procedure Laterality Date   ABDOMINAL HYSTERECTOMY  1998   BIOPSY  03/27/2023   Procedure: BIOPSY;  Surgeon: Maryruth Ole DASEN, MD;  Location: [facility]  ENDOSCOPY;  Service: Endoscopy;;   bladder tack  [phone]   BREAST BIOPSY Left 1990   BREAST BIOPSY Left 2018   core bx- neg   BREAST BIOPSY Left 08/04/2017   left UOQ 2 oclock INVASIVE DUCTAL CARCINOMA.   BREAST BIOPSY Right 08/2017   MRI biopsy, FIBROCYSTIC CHANGE AND USUAL DUCTAL HYPERPLASIA,, clip placed   BREAST BIOPSY Right 11/11/2023   MM RT BREAST BX W LOC DEV 1ST LESION IMAGE BX SPEC STEREO GUIDE 11/11/2023 [facility]   BREAST CYST EXCISION  x3   BREAST EXCISIONAL BIOPSY Bilateral    BREAST RECONSTRUCTION WITH PLACEMENT OF TISSUE EXPANDER AND FLEX HD (ACELLULAR HYDRATED DERMIS) Left 01/27/2018   Procedure: BREAST RECONSTRUCTION WITH PLACEMENT OF TISSUE EXPANDER AND FLEX HD (ACELLULAR HYDRATED DERMIS);  Surgeon: Lowery Estefana RAMAN, DO;  Location: [facility] ORS;  Service: Plastics;  Laterality: Left;   BREAST SURGERY Left 02/13/14   Fibrocystic changes, pseudo-angiomatous stromal hyperplasia. No atypia or malignancy.   CARPAL TUNNEL RELEASE Bilateral x2   CHOLECYSTECTOMY  2008   COLONOSCOPY WITH PROPOFOL  N/A 01/12/2015   Procedure: COLONOSCOPY WITH PROPOFOL ;  Surgeon: Lamar DASEN Holmes, MD;  Location: [facility] ENDOSCOPY;  Service: Endoscopy;  Laterality: N/A;   COLONOSCOPY WITH PROPOFOL  N/A 02/06/2020  Procedure: COLONOSCOPY WITH PROPOFOL ;  Surgeon: Maryruth Ole DASEN, MD;  Location: [facility] ENDOSCOPY;  Service: Endoscopy;  Laterality: N/A;   ESOPHAGEAL DILATION  03/27/2023   Procedure: ESOPHAGEAL DILATION;  Surgeon: Maryruth Ole DASEN, MD;  Location: [facility] ENDOSCOPY;  Service: Endoscopy;;   ESOPHAGOGASTRODUODENOSCOPY (EGD) WITH PROPOFOL  N/A 01/12/2015   Procedure: ESOPHAGOGASTRODUODENOSCOPY (EGD) WITH PROPOFOL ;  Surgeon: Lamar DASEN Holmes, MD;  Location: [facility] ENDOSCOPY;  Service: Endoscopy;  Laterality: N/A;   ESOPHAGOGASTRODUODENOSCOPY (EGD) WITH PROPOFOL  N/A 02/06/2020   Procedure: ESOPHAGOGASTRODUODENOSCOPY (EGD) WITH PROPOFOL ;  Surgeon: Maryruth Ole DASEN, MD;  Location: [facility] ENDOSCOPY;   Service: Endoscopy;  Laterality: N/A;   ESOPHAGOGASTRODUODENOSCOPY (EGD) WITH PROPOFOL  N/A 03/27/2023   Procedure: ESOPHAGOGASTRODUODENOSCOPY (EGD) WITH PROPOFOL ;  Surgeon: Maryruth Ole DASEN, MD;  Location: [facility] ENDOSCOPY;  Service: Endoscopy;  Laterality: N/A;   LIPOSUCTION WITH LIPOFILLING Left 12/23/2018   Procedure: LIPOSUCTION WITH LIPOFILLING FROM ABDOMEN TO LEFT BREAST;  Surgeon: Lowery Estefana RAMAN, DO;  Location: [facility];  Service: Plastics;  Laterality: Left;  90 min, please   MASTECTOMY Left 2019   [facility]   MASTECTOMY W/ SENTINEL NODE BIOPSY Left 01/27/2018   ypT1c ypN0; ER 90%, PR 20%, HER-2/neu not overexpressed.  MASTECTOMY WITH SENTINEL LYMPH NODE BIOPSY;  Surgeon: Dessa Reyes ORN, MD;  Location: [facility] ORS;  Service: General;  Laterality: Left;   MINOR BREAST BIOPSY Right 09/02/2017   Radiology perform procedure, fibrocystic changes right retroareolar area.   NASAL RECONSTRUCTION  1983   OOPHORECTOMY     POLYPECTOMY  03/27/2023   Procedure: POLYPECTOMY;  Surgeon: Maryruth Ole DASEN, MD;  Location: [facility] ENDOSCOPY;  Service: Endoscopy;;   PORT-A-CATH REMOVAL     PORTACATH PLACEMENT N/A 10/21/2017   Procedure: INSERTION PORT-A-CATH;  Surgeon: Dessa Reyes ORN, MD;  Location: [facility] ORS;  Service: General;  Laterality: N/A;   REMOVAL OF TISSUE EXPANDER AND PLACEMENT OF IMPLANT Left 05/13/2018   Procedure: REMOVAL OF TISSUE EXPANDER AND PLACEMENT OF IMPLANT;  Surgeon: Lowery Estefana RAMAN, DO;  Location: [facility] ORS;  Service: Plastics;  Laterality: Left;  total surgery time should be 3 hours, per provider   REVERSE SHOULDER ARTHROPLASTY Right 11/27/2020   Procedure: REVERSE SHOULDER ARTHROPLASTY;  Surgeon: Edie Norleen PARAS, MD;  Location: [facility] ORS;  Service: Orthopedics;  Laterality: Right;   SAVORY DILATION N/A 01/12/2015   Procedure: SAVORY DILATION;  Surgeon: Lamar DASEN Holmes, MD;  Location: [facility] ENDOSCOPY;  Service: Endoscopy;  Laterality: N/A;   TONSILLECTOMY AND  ADENOIDECTOMY  1956   Patient Active Problem List   Diagnosis Date Noted   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   Status post reverse arthroplasty of shoulder, right 11/27/2020   Carcinoma of overlapping sites of left breast in female, estrogen receptor positive (HCC) 07/30/2020   Personal history of other malignant neoplasm of skin 11/17/2019   Complex tear of medial meniscus of left knee as current injury 08/10/2019   Primary osteoarthritis of left knee 08/10/2019   S/P breast reconstruction, left 04/15/2019   Acquired absence of left breast 12/23/2018   Breast asymmetry following reconstructive surgery 08/31/2018   Acquired absence of breast 02/09/2018   S/P mastectomy, left 02/02/2018   Breast cancer (HCC) 01/27/2018   Thrush 11/05/2017   Goals of care, counseling/discussion 10/09/2017   Hypokalemia 10/09/2017   Acute renal failure (ARF) 09/11/2017   Diarrhea 09/11/2017   Encounter for antineoplastic chemotherapy 09/03/2017   Nodule of upper lobe of right lung 08/24/2017   Osteopenia 08/24/2017   Malignant neoplasm of left female breast (HCC) 08/07/2017  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   Incomplete emptying of bladder 09/03/2016   SUI (stress urinary incontinence, female) 09/03/2016   Mass of right breast 02/14/2014   Mass of upper outer quadrant of left breast 01/20/2014   Arthritis 12/28/2013   Asthma without status asthmaticus 12/28/2013   Esophageal reflux 12/28/2013   Hyperlipidemia 12/28/2013   Hypertension 05/09/2003    PCP: [facility]ikay   REFERRING PROVIDER: Stoiff   REFERRING DIAG: Overactive bladder with urge incontinence/stress incontinence for pelvic floor PT   Rationale for Evaluation and Treatment Rehabilitation  THERAPY DIAG:  Sacrococcygeal disorders, not elsewhere classified  Other abnormalities of gait and mobility  Chronic pain of left knee  Stiffness of right shoulder, not elsewhere classified  ONSET DATE:    SUBJECTIVE:       SUBJECTIVE STATEMENT TODAY: Pt has been doing her exercises                                                                                                                    SUBJECTIVE STATEMENT ON EVAL 12/03/23 : 1) Urinary Leakage:  pt doe snot wear pads. Pt was going through 5-6 pads before she started urinary medication.  Now pt has urgency. Pt has leakage with bending up yard work .  Nocturia 2-3 x / night    2) L knee pain - Pt was suppose to get L TKA but then she held it off because she had get R shoulder surgery. Pt used to walk 3 miles across 4-5 x a week but she has stopped due to pain. 5/10 level  with going up stairs,  getting into SUVs, bending   3) R shoulder stiffness:  Pt had total shoulder reverse placement and underwent PT. Pt is still limited in not raising as high and behind her back like she used to. Pt has dropped a few things. Wiping bowel movements are also affected by her limitation with reaching hand behind back    PERTINENT HISTORY:  L mastectomy with breat implant for breast CA R shoulder surgery  Gall bladder removal  3 Bladder slings surgeries Hysterectomy Basal cell cancer    PAIN:  Are you having pain? Yes: see above  PRECAUTIONS: No  WEIGHT BEARING RESTRICTIONS: No   FALLS:  Has patient fallen in last 6 months? No   LIVING ENVIRONMENT: Lives with: alone  Lives in: one story  Stairs: 4 STE with rail   OCCUPATION: retired psychologist, occupational ,    PLOF: IND   PATIENT GOALS:   Improve leakage, R shoulder ROM, and decrease L knee pain and return walking routine     OBJECTIVE:    [facility] PT Assessment - 01/21/24 1337       Observation/Other Assessments   Observations difficulty relaxing neck mm with scapular mobility HEP, , Hx of total shoulder surgery R      Palpation   SI assessment  L shoulder lowered, pelvis levelled    Palpation comment C7, T7, deviated to L, hypomobile T5-12 ,  midtrap, upper trap, rhomboids tightness           [facility] Adult PT Treatment/Exercise - 01/21/24 1413       Neuro Re-ed    Neuro Re-ed Details  propioception cues for relaxing BUE when perofrming scapular mobility ROM to minmize upper trap mm tightness ( deferring deep core training to next session because pt still has upepr trap overuse patterns that require more neuromuscuular training and manual Tx)      Modalities   Modalities Moist Heat      Moist Heat Therapy   Number Minutes Moist Heat 5 Minutes    Moist Heat Location --   C/T junction ( unbilled)     Manual Therapy   Manual therapy comments distraction at occiput, STM/MWM to realign and improve mobility along C/T junction            HOME EXERCISE PROGRAM: See pt instruction section    ASSESSMENT:  CLINICAL IMPRESSION:  Pt showed good carry over with pelvic girdle alignment.     Manual Tx was applied again with focus to realign C/ T junction to minimize curves and realign shoulders and optimize diaphragm excursion. Pt is regaining more semiflexion mobility and R shoulder mobility   Pt required cues for correct technique for last week's HEP for  cervical mobility.    Plan to continue with manual Tx to C/T junction.    Plan to add cervicoscapular stabilization next session.  Deferring deep core training to next session because pt still has upepr trap overuse patterns that require more neuromuscuular training and manual Tx.   Anticipate these improvements will help with R shoulder stiffness, optimize deep core function for pelvic floor , and L knee pain.   Plan to address realignment of spine/ pelvis at next session to help promote optimize IAP system for improved pelvic floor function, trunk stability, gait, balance, stabilization with mobility tasks.  Plan to address pelvic floor issues once pelvis and spine are realigned to yield better outcomes.                                                     Pt benefits from skilled PT.    OBJECTIVE IMPAIRMENTS  decreased activity tolerance, decreased coordination, decreased endurance, decreased mobility, difficulty walking, decreased ROM, decreased strength, decreased safety awareness, hypomobility, increased muscle spasms, impaired flexibility, improper body mechanics, postural dysfunction, and pain, scar restrictions   ACTIVITY LIMITATIONS  self-care, home chores, community activities    PARTICIPATION LIMITATIONS:  community,  walking activities    PERSONAL FACTORS   affecting patient's functional outcome:    REHAB POTENTIAL: Good   CLINICAL DECISION MAKING: Evolving/moderate complexity   EVALUATION COMPLEXITY: Moderate    PATIENT EDUCATION:    Education details: Showed pt anatomy images. Explained muscles attachments/ connection, physiology of deep core system/ spinal- thoracic-pelvis-lower kinetic chain as they relate to pt's presentation, Sx, and past Hx. Explained what and how these areas of deficits need to be restored to balance and function    See Therapeutic activity / neuromuscular re-education section  Answered pt's questions.   Person educated: Patient Education method: Explanation, Demonstration, Tactile cues, Verbal cues, and Handouts Education comprehension: verbalized understanding, returned demonstration, verbal cues required, tactile cues required, and needs further education     PLAN: PT FREQUENCY: 1x/week   PT DURATION: 10  weeks   PLANNED INTERVENTIONS:   Gait training;Stair training;Functional mobility training;DME Instruction;Therapeutic activities;Therapeutic exercise;Balance training;Neuromuscular re-education;Patient/family education;Vestibular;Visual/perceptual remediation/compensation;Passive range of motion;Moist Heat;Cryotherapy;Traction;Canalith Repostioning;Joint Manipulations;Manual lymph drainage;Manual techniques;Scar mobilization;Energy conservation;Dry needling;ADLs/Self Care Home Management;Biofeedback;Electrical Stimulation;Taping    PLAN FOR NEXT  SESSION: See clinical impression for plan     GOALS: Goals reviewed with patient? Yes  SHORT TERM GOALS: Target date: 12/31/2023    Pt will demo IND with HEP                    Baseline: Not IND            Goal status: INITIAL   LONG TERM GOALS: Target date: 02/11/2024    1.Pt will demo proper deep core coordination without chest breathing and optimal excursion of diaphragm/pelvic floor in order to  improve urgency and leakage with bending up yard work . Baseline: dyscoordination Goal status: INITIAL  2.  Pt will demo proper body mechanics in against gravity tasks and ADLs  work tasks, fitness  to minimize straining pelvic floor / back    Baseline: not IND, improper form that places strain on pelvic floor  Goal status: INITIAL    3. Pt will demo increased gait speed > 1.3 m/s with reciprocal gait pattern, longer stride length  in order to ambulate safely in community and build back up  to regular walking routine of 3 miles - 4-5 x a week  Baseline: decreased R stance, limited posterior rotation of R pelvis , 1.04 m/s  Goal status: INITIAL    4. Pt will demo levelled pelvic girdle and shoulder height in order to progress to deep core strengthening HEP and restore mobility at spine, pelvis, gait, posture minimize falls, and improve balance  Baseline: L shoulder and L iliac crest lowered  Goal status: INITIAL   5. Pt will improve PFDI-7 questionnaire to  pts  score change  to demo improved QOL  Baseline:    ( greater pts indicate greater negative impact on QOL)     pts  ( total)    pts  ( UIQ-7 )    pts  ( CRAIQ-7 )    pts  ( POPIQ-7 )  Baseline:  Goal status: INITIAL  6. Pt will improve AROM with reach behind back ( R thumb above level of L5) to be able to reeach behind to clean after BMs and shoulder flexion > 130 deg ) for reaching above cabinets  Baseline:  reach hand behind back: R thumb at PSIS level, L T 5 level   shoulder flexion seated: 120 deg R, 160 deg  L   Wiping bowel movements are also affected by her limitation with reaching hand behind back    Reaching up to cabinets   Goal Status: INITIAL:    7.  Pt will communicate with pulmonologist for sleep study to rule in / out OSA   Baseline:      Nocturia 2-3 x / night  Goal Status: MET Pt talked to pulmonologist and he did not recommend a sleep study   Pia Lupe Plump, PT 01/21/2024, 2:17 PM

## 2024-01-28 ENCOUNTER — Ambulatory Visit: Admitting: Physical Therapy

## 2024-01-28 DIAGNOSIS — M533 Sacrococcygeal disorders, not elsewhere classified: Secondary | ICD-10-CM

## 2024-01-28 DIAGNOSIS — R2689 Other abnormalities of gait and mobility: Secondary | ICD-10-CM

## 2024-01-28 DIAGNOSIS — M25611 Stiffness of right shoulder, not elsewhere classified: Secondary | ICD-10-CM

## 2024-01-28 DIAGNOSIS — G8929 Other chronic pain: Secondary | ICD-10-CM

## 2024-01-28 NOTE — Therapy (Signed)
 OUTPATIENT PHYSICAL THERAPY TREATMENT    Patient Name: Mariah Hogan MRN: [id number] DOB:March 08, 1951, 73 y.o., female Today's Date: 01/28/2024   PT End of Session - 01/28/24 1340     Visit Number 7    Number of Visits 10    Date for Recertification  02/11/24    PT Start Time 1335    PT Stop Time 1415    PT Time Calculation (min) 40 min    Activity Tolerance Patient tolerated treatment well;No increased pain    Behavior During Therapy WFL for tasks assessed/performed          Past Medical History:  Diagnosis Date   Anemia    Arthritis    Asthma    H/O YEARS AGO-NOW HAS COUGHING SPELLS WHICH IS WHY SHE HAS HER ALBUTEROL  INHALER   Basal cell carcinoma    R nose ([facility])   Basal cell carcinoma 01/29/2016   Right forehead above med brow   Basal cell carcinoma 10/12/2019   L nasal tip - [facility] 05/15/2020   BRCA gene mutation negative 08/12/2017   Variant of unknown significance at APC only abnormality.   Breast cancer (HCC) 08/04/2017   1.8 cm ER 90%, PR 20%, HER-2/neu negative invasive mammary carcinoma of the left upper outer quadrant.  MammaPrint: High risk.   Cancer (HCC)    [phone] basal cell carcinoma   Complication of anesthesia    HARD TIME WAKING UP   Dysplastic nevus 02/21/2010   Right mid pretibial, mild   Dysrhythmia    TACHYCARDIA   GERD (gastroesophageal reflux disease)    History of hiatal hernia    SMALL   Hyperlipidemia    Hypertension    PT STATES IN 01-18-18 PREOP PHONE INTERVIEW THAT HER BP HAS BEEN ELEVATED SINCE STARTING ON VALSARTAN- PCP SWITCHED PT BACK TO MAXIDE ON 01-18-18 AND PT STATES THAT SHE HAS LOST 6-10 POUNDS OF FLUID SINCE RESTARTING MAXIDE.     Personal history of chemotherapy    PONV (postoperative nausea and vomiting)    Sjogren's syndrome 07/2020   Past Surgical History:  Procedure Laterality Date   ABDOMINAL HYSTERECTOMY  1998   BIOPSY  03/27/2023   Procedure: BIOPSY;  Surgeon: Maryruth Ole DASEN, MD;  Location: [facility]  ENDOSCOPY;  Service: Endoscopy;;   bladder tack  [id number]   BREAST BIOPSY Left 1990   BREAST BIOPSY Left 2018   core bx- neg   BREAST BIOPSY Left 08/04/2017   left UOQ 2 oclock INVASIVE DUCTAL CARCINOMA.   BREAST BIOPSY Right 08/2017   MRI biopsy, FIBROCYSTIC CHANGE AND USUAL DUCTAL HYPERPLASIA,, clip placed   BREAST BIOPSY Right 11/11/2023   MM RT BREAST BX W LOC DEV 1ST LESION IMAGE BX SPEC STEREO GUIDE 11/11/2023 [facility]   BREAST CYST EXCISION  x3   BREAST EXCISIONAL BIOPSY Bilateral    BREAST RECONSTRUCTION WITH PLACEMENT OF TISSUE EXPANDER AND FLEX HD (ACELLULAR HYDRATED DERMIS) Left 01/27/2018   Procedure: BREAST RECONSTRUCTION WITH PLACEMENT OF TISSUE EXPANDER AND FLEX HD (ACELLULAR HYDRATED DERMIS);  Surgeon: Lowery Estefana RAMAN, DO;  Location: [facility] ORS;  Service: Plastics;  Laterality: Left;   BREAST SURGERY Left 02/13/14   Fibrocystic changes, pseudo-angiomatous stromal hyperplasia. No atypia or malignancy.   CARPAL TUNNEL RELEASE Bilateral x2   CHOLECYSTECTOMY  2008   COLONOSCOPY WITH PROPOFOL  N/A 01/12/2015   Procedure: COLONOSCOPY WITH PROPOFOL ;  Surgeon: Lamar DASEN Holmes, MD;  Location: [facility] ENDOSCOPY;  Service: Endoscopy;  Laterality: N/A;   COLONOSCOPY WITH PROPOFOL  N/A 02/06/2020  Procedure: COLONOSCOPY WITH PROPOFOL ;  Surgeon: Maryruth Ole DASEN, MD;  Location: [facility] ENDOSCOPY;  Service: Endoscopy;  Laterality: N/A;   ESOPHAGEAL DILATION  03/27/2023   Procedure: ESOPHAGEAL DILATION;  Surgeon: Maryruth Ole DASEN, MD;  Location: [facility] ENDOSCOPY;  Service: Endoscopy;;   ESOPHAGOGASTRODUODENOSCOPY (EGD) WITH PROPOFOL  N/A 01/12/2015   Procedure: ESOPHAGOGASTRODUODENOSCOPY (EGD) WITH PROPOFOL ;  Surgeon: Lamar DASEN Holmes, MD;  Location: [facility] ENDOSCOPY;  Service: Endoscopy;  Laterality: N/A;   ESOPHAGOGASTRODUODENOSCOPY (EGD) WITH PROPOFOL  N/A 02/06/2020   Procedure: ESOPHAGOGASTRODUODENOSCOPY (EGD) WITH PROPOFOL ;  Surgeon: Maryruth Ole DASEN, MD;  Location: [facility] ENDOSCOPY;   Service: Endoscopy;  Laterality: N/A;   ESOPHAGOGASTRODUODENOSCOPY (EGD) WITH PROPOFOL  N/A 03/27/2023   Procedure: ESOPHAGOGASTRODUODENOSCOPY (EGD) WITH PROPOFOL ;  Surgeon: Maryruth Ole DASEN, MD;  Location: [facility] ENDOSCOPY;  Service: Endoscopy;  Laterality: N/A;   LIPOSUCTION WITH LIPOFILLING Left 12/23/2018   Procedure: LIPOSUCTION WITH LIPOFILLING FROM ABDOMEN TO LEFT BREAST;  Surgeon: Lowery Estefana RAMAN, DO;  Location: [facility];  Service: Plastics;  Laterality: Left;  90 min, please   MASTECTOMY Left 2019   [facility]   MASTECTOMY W/ SENTINEL NODE BIOPSY Left 01/27/2018   ypT1c ypN0; ER 90%, PR 20%, HER-2/neu not overexpressed.  MASTECTOMY WITH SENTINEL LYMPH NODE BIOPSY;  Surgeon: Dessa Reyes ORN, MD;  Location: [facility] ORS;  Service: General;  Laterality: Left;   MINOR BREAST BIOPSY Right 09/02/2017   Radiology perform procedure, fibrocystic changes right retroareolar area.   NASAL RECONSTRUCTION  1983   OOPHORECTOMY     POLYPECTOMY  03/27/2023   Procedure: POLYPECTOMY;  Surgeon: Maryruth Ole DASEN, MD;  Location: [facility] ENDOSCOPY;  Service: Endoscopy;;   PORT-A-CATH REMOVAL     PORTACATH PLACEMENT N/A 10/21/2017   Procedure: INSERTION PORT-A-CATH;  Surgeon: Dessa Reyes ORN, MD;  Location: [facility] ORS;  Service: General;  Laterality: N/A;   REMOVAL OF TISSUE EXPANDER AND PLACEMENT OF IMPLANT Left 05/13/2018   Procedure: REMOVAL OF TISSUE EXPANDER AND PLACEMENT OF IMPLANT;  Surgeon: Lowery Estefana RAMAN, DO;  Location: [facility]S;  Service: Plastics;  Laterality: Left;  total surgery time should be 3 hours, per provider   REVERSE SHOULDER ARTHROPLASTY Right 11/27/2020   Procedure: REVERSE SHOULDER ARTHROPLASTY;  Surgeon: Edie Norleen PARAS, MD;  Location: [facility] ORS;  Service: Orthopedics;  Laterality: Right;   SAVORY DILATION N/A 01/12/2015   Procedure: SAVORY DILATION;  Surgeon: Lamar DASEN Holmes, MD;  Location: [facility] ENDOSCOPY;  Service: Endoscopy;  Laterality: N/A;   TONSILLECTOMY AND  ADENOIDECTOMY  1956   Patient Active Problem List   Diagnosis Date Noted   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   Status post reverse arthroplasty of shoulder, right 11/27/2020   Carcinoma of overlapping sites of left breast in female, estrogen receptor positive (HCC) 07/30/2020   Personal history of other malignant neoplasm of skin 11/17/2019   Complex tear of medial meniscus of left knee as current injury 08/10/2019   Primary osteoarthritis of left knee 08/10/2019   S/P breast reconstruction, left 04/15/2019   Acquired absence of left breast 12/23/2018   Breast asymmetry following reconstructive surgery 08/31/2018   Acquired absence of breast 02/09/2018   S/P mastectomy, left 02/02/2018   Breast cancer (HCC) 01/27/2018   Thrush 11/05/2017   Goals of care, counseling/discussion 10/09/2017   Hypokalemia 10/09/2017   Acute renal failure (ARF) 09/11/2017   Diarrhea 09/11/2017   Encounter for antineoplastic chemotherapy 09/03/2017   Nodule of upper lobe of right lung 08/24/2017   Osteopenia 08/24/2017   Malignant neoplasm of left female breast (HCC) 08/07/2017  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   Incomplete emptying of bladder 09/03/2016   SUI (stress urinary incontinence, female) 09/03/2016   Mass of right breast 02/14/2014   Mass of upper outer quadrant of left breast 01/20/2014   Arthritis 12/28/2013   Asthma without status asthmaticus 12/28/2013   Esophageal reflux 12/28/2013   Hyperlipidemia 12/28/2013   Hypertension 05/09/2003    PCP: [facility]   REFERRING PROVIDER: Sto[place]   REFERRING DIAG: Overactive bladder with urge incontinence/stress incontinence for pelvic floor PT   Rationale for Evaluation and Treatment Rehabilitation  THERAPY DIAG:  Sacrococcygeal disorders, not elsewhere classified  Other abnormalities of gait and mobility  Chronic pain of left knee  Stiffness of right shoulder, not elsewhere classified  ONSET DATE:    SUBJECTIVE:       SUBJECTIVE STATEMENT TODAY: Pt noticed LBP when standing and baking and walking her drivewayafter last session.                                                                                                             SUBJECTIVE STATEMENT ON EVAL 12/03/23 : 1) Urinary Leakage:  pt doe snot wear pads. Pt was going through 5-6 pads before she started urinary medication.  Now pt has urgency. Pt has leakage with bending up yard work .  Nocturia 2-3 x / night    2) L knee pain - Pt was suppose to get L TKA but then she held it off because she had get R shoulder surgery. Pt used to walk 3 miles across 4-5 x a week but she has stopped due to pain. 5/10 level  with going up stairs,  getting into SUVs, bending   3) R shoulder stiffness:  Pt had total shoulder reverse placement and underwent PT. Pt is still limited in not raising as high and behind her back like she used to. Pt has dropped a few things. Wiping bowel movements are also affected by her limitation with reaching hand behind back    PERTINENT HISTORY:  L mastectomy with breat implant for breast CA R shoulder surgery  Gall bladder removal  3 Bladder slings surgeries Hysterectomy Basal cell cancer    PAIN:  Are you having pain? Yes: see above  PRECAUTIONS: No  WEIGHT BEARING RESTRICTIONS: No   FALLS:  Has patient fallen in last 6 months? No   LIVING ENVIRONMENT: Lives with: alone  Lives in: one story  Stairs: 4 STE with rail   OCCUPATION: retired psychologist, occupational ,    PLOF: IND   PATIENT GOALS:   Improve leakage, R shoulder ROM, and decrease L knee pain and return walking routine     OBJECTIVE:    [facility] PT Assessment - 01/28/24 1341       Observation/Other Assessments   Observations hallus valgus 40 deg L, 91 cm R, 92 cm L, medial malleoli to ASIS, 20 deg R , L great toe overlapping 2nd toe, ,      Posture/Postural Control   Posture Comments no more rounded shoudlers, less forward  head / thoracic  kyphosis      Palpation   SI assessment  L shoulder lowered, iliac crest    Palpation comment midfoot hypomobility ant tib, tib-fib L  ( plan to assess and Tx RLE next session)      Bed Mobility   Bed Mobility --   poor good carry with logrolling, using head crunch.     Ambulation/Gait   Gait Comments decreased stance on LR           [facility] Adult PT Treatment/Exercise - 01/28/24 1341       Self-Care   Other Self-Care Comments  Educated to buy manicure foam spreaders to spread toes, keep wearing the shoe lift in toe box and heel in the R shoe to level pelvis      Neuro Re-ed    Neuro Re-ed Details  provided propicoeptive cues for feet mobility HEP      Manual Therapy   Manual therapy comments distraction, PA/AP mob Grade II, STM/MWM at plantar fascia, between rays, ant tib, L           HOME EXERCISE PROGRAM: See pt instruction section    ASSESSMENT:  CLINICAL IMPRESSION:  Pt showed relapse of misalignment of pelvis and shoulder but no longer has thoracic kyphosis nor curves in her spine.  Leg length difference was assessed with R LE being shorter and likely associated with L knee pain . Significant hallux valgus on L > R.   Placed shoe lift in toe box and heel of R shoe to accommodate shorter leg.   Pt reported no more LBP after shoe lift placement.  The LBP  that occurred last week likely related to realignment of spine and curves getting straightened out but lower body differences need to be addressed now.    Manual Tx was applied to [facility] to improve feet mobility which will further help pelvic function.   Educated to buy manicure foam spreaders to spread toes, keep wearing the shoe lift in toe box and heel in the R shoe to level pelvis   Apply more manual Tx to R foot and progress to  deep core training to next session. Anticipate these improvements will help with R shoulder stiffness, optimize deep core function for pelvic floor , and L knee pain.   Plan to address  realignment of spine/ pelvis at next session to help promote optimize IAP system for improved pelvic floor function, trunk stability, gait, balance, stabilization with mobility tasks.  Plan to address pelvic floor issues once pelvis and spine are realigned to yield better outcomes.                                                     Pt benefits from skilled PT.    OBJECTIVE IMPAIRMENTS decreased activity tolerance, decreased coordination, decreased endurance, decreased mobility, difficulty walking, decreased ROM, decreased strength, decreased safety awareness, hypomobility, increased muscle spasms, impaired flexibility, improper body mechanics, postural dysfunction, and pain, scar restrictions   ACTIVITY LIMITATIONS  self-care, home chores, community activities    PARTICIPATION LIMITATIONS:  community,  walking activities    PERSONAL FACTORS   affecting patient's functional outcome:    REHAB POTENTIAL: Good   CLINICAL DECISION MAKING: Evolving/moderate complexity   EVALUATION COMPLEXITY: Moderate    PATIENT EDUCATION:    Education details: Showed pt anatomy  images. Explained muscles attachments/ connection, physiology of deep core system/ spinal- thoracic-pelvis-lower kinetic chain as they relate to pt's presentation, Sx, and past Hx. Explained what and how these areas of deficits need to be restored to balance and function    See Therapeutic activity / neuromuscular re-education section  Answered pt's questions.   Person educated: Patient Education method: Explanation, Demonstration, Tactile cues, Verbal cues, and Handouts Education comprehension: verbalized understanding, returned demonstration, verbal cues required, tactile cues required, and needs further education     PLAN: PT FREQUENCY: 1x/week   PT DURATION: 10 weeks   PLANNED INTERVENTIONS:   Gait training;Stair training;Functional mobility training;DME Instruction;Therapeutic activities;Therapeutic exercise;Balance  training;Neuromuscular re-education;Patient/family education;Vestibular;Visual/perceptual remediation/compensation;Passive range of motion;Moist Heat;Cryotherapy;Traction;Canalith Repostioning;Joint Manipulations;Manual lymph drainage;Manual techniques;Scar mobilization;Energy conservation;Dry needling;ADLs/Self Care Home Management;Biofeedback;Electrical Stimulation;Taping    PLAN FOR NEXT SESSION: See clinical impression for plan     GOALS: Goals reviewed with patient? Yes  SHORT TERM GOALS: Target date: 12/31/2023    Pt will demo IND with HEP                    Baseline: Not IND            Goal status: INITIAL   LONG TERM GOALS: Target date: 02/11/2024    1.Pt will demo proper deep core coordination without chest breathing and optimal excursion of diaphragm/pelvic floor in order to  improve urgency and leakage with bending up yard work . Baseline: dyscoordination Goal status: INITIAL  2.  Pt will demo proper body mechanics in against gravity tasks and ADLs  work tasks, fitness  to minimize straining pelvic floor / back    Baseline: not IND, improper form that places strain on pelvic floor  Goal status: INITIAL    3. Pt will demo increased gait speed > 1.3 m/s with reciprocal gait pattern, longer stride length  in order to ambulate safely in community and build back up  to regular walking routine of 3 miles - 4-5 x a week  Baseline: decreased R stance, limited posterior rotation of R pelvis , 1.04 m/s  Goal status: INITIAL    4. Pt will demo levelled pelvic girdle and shoulder height in order to progress to deep core strengthening HEP and restore mobility at spine, pelvis, gait, posture minimize falls, and improve balance  Baseline: L shoulder and L iliac crest lowered  Goal status: INITIAL   5. Pt will improve PFDI-7 questionnaire to  pts  score change  to demo improved QOL  Baseline:    ( greater pts indicate greater negative impact on QOL)     pts  ( total)    pts   ( UIQ-7 )    pts  ( CRAIQ-7 )    pts  ( POPIQ-7 )  Baseline:  Goal status: INITIAL  6. Pt will improve AROM with reach behind back ( R thumb above level of L5) to be able to reeach behind to clean after BMs and shoulder flexion > 130 deg ) for reaching above cabinets  Baseline:  reach hand behind back: R thumb at PSIS level, L T 5 level   shoulder flexion seated: 120 deg R, 160 deg L   Wiping bowel movements are also affected by her limitation with reaching hand behind back    Reaching up to cabinets   Goal Status: INITIAL:    7.  Pt will communicate with pulmonologist for sleep study to rule in / out OSA   Baseline:  Nocturia 2-3 x / night  Goal Status: MET Pt talked to pulmonologist and he did not recommend a sleep study   Pia Lupe Plump, PT 01/28/2024, 2:15 PM

## 2024-01-28 NOTE — Patient Instructions (Signed)
 Wear manicure spreaders 30 min watching TV not walking  And wear them for this exercise   Feet slides :   Points of contact at sitting bones  Four points of contact of foot,  Side knee back while keeping knee out along 2-3rd toe line   Heel up, ankle not twist out Lower heel while keeping knee out along 2-3rd toe line Four points of contact of foot, Slide foot back while keeping knee out along 2-3rd toe line   Repeated with other foot     __   Wear shoe lift in R shoe toe box and heel  Remove if umcomfortable

## 2024-02-02 ENCOUNTER — Ambulatory Visit: Admitting: Physical Therapy

## 2024-02-02 DIAGNOSIS — R2689 Other abnormalities of gait and mobility: Secondary | ICD-10-CM

## 2024-02-02 DIAGNOSIS — M533 Sacrococcygeal disorders, not elsewhere classified: Secondary | ICD-10-CM

## 2024-02-02 DIAGNOSIS — G8929 Other chronic pain: Secondary | ICD-10-CM

## 2024-02-02 DIAGNOSIS — M25611 Stiffness of right shoulder, not elsewhere classified: Secondary | ICD-10-CM

## 2024-02-02 NOTE — Therapy (Signed)
 OUTPATIENT PHYSICAL THERAPY TREATMENT    Patient Name: Mariah Hogan MRN: [id number] DOB:[birth year], 73 y.o., female Today's Date: 02/02/2024   PT End of Session - 02/02/24 1332     Visit Number 8    Number of Visits 10    Date for Recertification  02/11/24    PT Start Time 1330    PT Stop Time 1415    PT Time Calculation (min) 45 min    Activity Tolerance Patient tolerated treatment well;No increased pain    Behavior During Therapy WFL for tasks assessed/performed          Past Medical History:  Diagnosis Date   Anemia    Arthritis    Asthma    H/O YEARS AGO-NOW HAS COUGHING SPELLS WHICH IS WHY SHE HAS HER ALBUTEROL  INHALER   Basal cell carcinoma    R nose ([facility])   Basal cell carcinoma 01/29/2016   Right forehead above med brow   Basal cell carcinoma 10/12/2019   L nasal tip - [facility] 05/15/2020   BRCA gene mutation negative 08/12/2017   Variant of unknown significance at APC only abnormality.   Breast cancer (HCC) 08/04/2017   1.8 cm ER 90%, PR 20%, HER-2/neu negative invasive mammary carcinoma of the left upper outer quadrant.  MammaPrint: High risk.   Cancer (HCC)    [phone] basal cell carcinoma   Complication of anesthesia    HARD TIME WAKING UP   Dysplastic nevus 02/21/2010   Right mid pretibial, mild   Dysrhythmia    TACHYCARDIA   GERD (gastroesophageal reflux disease)    History of hiatal hernia    SMALL   Hyperlipidemia    Hypertension    PT STATES IN 01-18-18 PREOP PHONE INTERVIEW THAT HER BP HAS BEEN ELEVATED SINCE STARTING ON VALSARTAN- PCP SWITCHED PT BACK TO MAXIDE ON 01-18-18 AND PT STATES THAT SHE HAS LOST 6-10 POUNDS OF FLUID SINCE RESTARTING MAXIDE.     Personal history of chemotherapy    PONV (postoperative nausea and vomiting)    Sjogren's syndrome 07/2020   Past Surgical History:  Procedure Laterality Date   ABDOMINAL HYSTERECTOMY  1998   BIOPSY  03/27/2023   Procedure: BIOPSY;  Surgeon: Maryruth Ole DASEN, MD;  Location: [facility]  ENDOSCOPY;  Service: Endoscopy;;   bladder tack  [phone]   BREAST BIOPSY Left 1990   BREAST BIOPSY Left 2018   core bx- neg   BREAST BIOPSY Left 08/04/2017   left UOQ 2 oclock INVASIVE DUCTAL CARCINOMA.   BREAST BIOPSY Right 08/2017   MRI biopsy, FIBROCYSTIC CHANGE AND USUAL DUCTAL HYPERPLASIA,, clip placed   BREAST BIOPSY Right 11/11/2023   MM RT BREAST BX W LOC DEV 1ST LESION IMAGE BX SPEC STEREO GUIDE 11/11/2023 [facility]   BREAST CYST EXCISION  x3   BREAST EXCISIONAL BIOPSY Bilateral    BREAST RECONSTRUCTION WITH PLACEMENT OF TISSUE EXPANDER AND FLEX HD (ACELLULAR HYDRATED DERMIS) Left 01/27/2018   Procedure: BREAST RECONSTRUCTION WITH PLACEMENT OF TISSUE EXPANDER AND FLEX HD (ACELLULAR HYDRATED DERMIS);  Surgeon: Lowery Estefana RAMAN, DO;  Location: [facility] ORS;  Service: Plastics;  Laterality: Left;   BREAST SURGERY Left 02/13/14   Fibrocystic changes, pseudo-angiomatous stromal hyperplasia. No atypia or malignancy.   CARPAL TUNNEL RELEASE Bilateral x2   CHOLECYSTECTOMY  2008   COLONOSCOPY WITH PROPOFOL  N/A 01/12/2015   Procedure: COLONOSCOPY WITH PROPOFOL ;  Surgeon: Lamar DASEN Holmes, MD;  Location: [facility] ENDOSCOPY;  Service: Endoscopy;  Laterality: N/A;   COLONOSCOPY WITH PROPOFOL  N/A 02/06/2020  Procedure: COLONOSCOPY WITH PROPOFOL ;  Surgeon: Maryruth Ole DASEN, MD;  Location: [facility] ENDOSCOPY;  Service: Endoscopy;  Laterality: N/A;   ESOPHAGEAL DILATION  03/27/2023   Procedure: ESOPHAGEAL DILATION;  Surgeon: Maryruth Ole DASEN, MD;  Location: [facility] ENDOSCOPY;  Service: Endoscopy;;   ESOPHAGOGASTRODUODENOSCOPY (EGD) WITH PROPOFOL  N/A 01/12/2015   Procedure: ESOPHAGOGASTRODUODENOSCOPY (EGD) WITH PROPOFOL ;  Surgeon: Lamar DASEN Holmes, MD;  Location: [facility] ENDOSCOPY;  Service: Endoscopy;  Laterality: N/A;   ESOPHAGOGASTRODUODENOSCOPY (EGD) WITH PROPOFOL  N/A 02/06/2020   Procedure: ESOPHAGOGASTRODUODENOSCOPY (EGD) WITH PROPOFOL ;  Surgeon: Maryruth Ole DASEN, MD;  Location: [facility] ENDOSCOPY;   Service: Endoscopy;  Laterality: N/A;   ESOPHAGOGASTRODUODENOSCOPY (EGD) WITH PROPOFOL  N/A 03/27/2023   Procedure: ESOPHAGOGASTRODUODENOSCOPY (EGD) WITH PROPOFOL ;  Surgeon: Maryruth Ole DASEN, MD;  Location: [facility] ENDOSCOPY;  Service: Endoscopy;  Laterality: N/A;   LIPOSUCTION WITH LIPOFILLING Left 12/23/2018   Procedure: LIPOSUCTION WITH LIPOFILLING FROM ABDOMEN TO LEFT BREAST;  Surgeon: Lowery Estefana RAMAN, DO;  Location: [facility];  Service: Plastics;  Laterality: Left;  90 min, please   MASTECTOMY Left 2019   [facility]   MASTECTOMY W/ SENTINEL NODE BIOPSY Left 01/27/2018   ypT1c ypN0; ER 90%, PR 20%, HER-2/neu not overexpressed.  MASTECTOMY WITH SENTINEL LYMPH NODE BIOPSY;  Surgeon: Dessa Reyes ORN, MD;  Location: [facility] ORS;  Service: General;  Laterality: Left;   MINOR BREAST BIOPSY Right 09/02/2017   Radiology perform procedure, fibrocystic changes right retroareolar area.   NASAL RECONSTRUCTION  1983   OOPHORECTOMY     POLYPECTOMY  03/27/2023   Procedure: POLYPECTOMY;  Surgeon: Maryruth Ole DASEN, MD;  Location: [facility] ENDOSCOPY;  Service: Endoscopy;;   PORT-A-CATH REMOVAL     PORTACATH PLACEMENT N/A 10/21/2017   Procedure: INSERTION PORT-A-CATH;  Surgeon: Dessa Reyes ORN, MD;  Location: [facility];  Service: General;  Laterality: N/A;   REMOVAL OF TISSUE EXPANDER AND PLACEMENT OF IMPLANT Left 05/13/2018   Procedure: REMOVAL OF TISSUE EXPANDER AND PLACEMENT OF IMPLANT;  Surgeon: Lowery Estefana RAMAN, DO;  Location: [facility];  Service: Plastics;  Laterality: Left;  total surgery time should be 3 hours, per provider   REVERSE SHOULDER ARTHROPLASTY Right 11/27/2020   Procedure: REVERSE SHOULDER ARTHROPLASTY;  Surgeon: Edie Norleen PARAS, MD;  Location: [facility] OR[facility];  Service: Orthopedics;  Laterality: Right;   SAVORY DILATION N/A 01/12/2015   Procedure: SAVORY DILATION;  Surgeon: Lamar DASEN Holmes, MD;  Location: [facility] ENDOSCOPY;  Service: Endoscopy;  Laterality: N/A;   TONSILLECTOMY AND  ADENOIDECTOMY  1956   Patient Active Problem List   Diagnosis Date Noted   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   Status post reverse arthroplasty of shoulder, right 11/27/2020   Carcinoma of overlapping sites of left breast in female, estrogen receptor positive (HCC) 07/30/2020   Personal history of other malignant neoplasm of skin 11/17/2019   Complex tear of medial meniscus of left knee as current injury 08/10/2019   Primary osteoarthritis of left knee 08/10/2019   S/P breast reconstruction, left 04/15/2019   Acquired absence of left breast 12/23/2018   Breast asymmetry following reconstructive surgery 08/31/2018   Acquired absence of breast 02/09/2018   S/P mastectomy, left 02/02/2018   Breast cancer (HCC) 01/27/2018   Thrush 11/05/2017   Goals of care, counseling/discussion 10/09/2017   Hypokalemia 10/09/2017   Acute renal failure (ARF) 09/11/2017   Diarrhea 09/11/2017   Encounter for antineoplastic chemotherapy 09/03/2017   Nodule of upper lobe of right lung 08/24/2017   Osteopenia 08/24/2017   Malignant neoplasm of left female breast (HCC) 08/07/2017  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   Incomplete emptying of bladder 09/03/2016   SUI (stress urinary incontinence, female) 09/03/2016   Mass of right breast 02/14/2014   Mass of upper outer quadrant of left breast 01/20/2014   Arthritis 12/28/2013   Asthma without status asthmaticus 12/28/2013   Esophageal reflux 12/28/2013   Hyperlipidemia 12/28/2013   Hypertension 05/09/2003    PCP: [facility]   REFERRING PROVIDER: Stoiff   REFERRING DIAG: Overactive bladder with urge incontinence/stress incontinence for pelvic floor PT   Rationale for Evaluation and Treatment Rehabilitation  THERAPY DIAG:  Sacrococcygeal disorders, not elsewhere classified  Other abnormalities of gait and mobility  Chronic pain of left knee  Stiffness of right shoulder, not elsewhere classified  ONSET DATE:    SUBJECTIVE:       SUBJECTIVE STATEMENT TODAY: Pt reported the pedicure spreaders would not stay between her toes. Pt noticed her L knee pain is better. It only bothers her with going up stairs.   Pt noticed L hip was hurting after leaving last session. It stopped aching after she walked around in [employer]      Pt would like to return to walking more                                                                                                       Shoe lift in R shoe does not bother her   SUBJECTIVE STATEMENT ON EVAL 12/03/23 : 1) Urinary Leakage:  pt doe snot wear pads. Pt was going through 5-6 pads before she started urinary medication.  Now pt has urgency. Pt has leakage with bending up yard work .  Nocturia 2-3 x / night    2) L knee pain - Pt was suppose to get L TKA but then she held it off because she had get R shoulder surgery. Pt used to walk 3 miles across 4-5 x a week but she has stopped due to pain. 5/10 level  with going up stairs,  getting into SUVs, bending   3) R shoulder stiffness:  Pt had total shoulder reverse placement and underwent PT. Pt is still limited in not raising as high and behind her back like she used to. Pt has dropped a few things. Wiping bowel movements are also affected by her limitation with reaching hand behind back    PERTINENT HISTORY:  L mastectomy with breat implant for breast CA R shoulder surgery  Gall bladder removal  3 Bladder slings surgeries Hysterectomy Basal cell cancer    PAIN:  Are you having pain? Yes: see above  PRECAUTIONS: No  WEIGHT BEARING RESTRICTIONS: No   FALLS:  Has patient fallen in last 6 months? No   LIVING ENVIRONMENT: Lives with: alone  Lives in: one story  Stairs: 4 STE with rail   OCCUPATION: retired psychologist, occupational ,    PLOF: IND   PATIENT GOALS:   Improve leakage, R shoulder ROM, and decrease L knee pain and return walking routine     OBJECTIVE:    [facility] PT Assessment - 02/02/24 1332  Other:    Other/ Comments hip abd/flex limited on L > R in figure 4 sitting      Palpation   SI assessment  L shoulder lowered,levelled  iliac crest    Palpation comment midfoot hypomobility ant tib, tib-fib R ,   hypomobile T10-12 deviated to The L, L intercostal T10-11 limited excursion      Ambulation/Gait   Gait Comments small stride, limited arm swing          [facility] Adult PT Treatment/Exercise - 02/02/24 1407       Ambulation/Gait   Gait Comments propioception cues for longer stride, armswings, higher knees      Neuro Re-ed    Neuro Re-ed Details  propioceptive cues for feet massage to promote toe abduction and DF/EV B      Manual Therapy   Manual therapy comments distraction, PA/AP mob Grade II, STM/MWM at plantar fascia, between rays, ant tib R,   STM/MWW at T10-12 , intercostal T10-11 to improve mobility and excursion of diaphragm           HOME EXERCISE PROGRAM: See pt instruction section    ASSESSMENT:  CLINICAL IMPRESSION:  Pt showed levelled pelvis and but L  shoulder remained lowered   Placed shoe lift in toe box and heel of R shoe to accommodate shorter leg last session and pt reported shoe lift in R shoe does not bother her      Manual Tx was applied to thoracic area to realign spine and to R LKC to improve feet mobility which will further help pelvic function.    Educated to buy manicure foam spreaders to spread toes and educated on feet massage to promote toe abduction and DF/EV and stronger push off   Pt required propioception cues for longer stride, armswings, higher knees   Apply more manual Tx to R foot and progress to  deep core training to next session. Anticipate these improvements will help with R shoulder stiffness, optimize deep core function for pelvic floor , and L knee pain.   Anticipate these  help promote optimize IAP system for improved pelvic floor function, trunk stability, gait, balance, stabilization with mobility tasks.  Plan to  address pelvic floor issues once pelvis and spine are realigned to yield better outcomes.                                                     Pt benefits from skilled PT.    OBJECTIVE IMPAIRMENTS decreased activity tolerance, decreased coordination, decreased endurance, decreased mobility, difficulty walking, decreased ROM, decreased strength, decreased safety awareness, hypomobility, increased muscle spasms, impaired flexibility, improper body mechanics, postural dysfunction, and pain, scar restrictions   ACTIVITY LIMITATIONS  self-care, home chores, community activities    PARTICIPATION LIMITATIONS:  community,  walking activities    PERSONAL FACTORS   affecting patient's functional outcome:    REHAB POTENTIAL: Good   CLINICAL DECISION MAKING: Evolving/moderate complexity   EVALUATION COMPLEXITY: Moderate    PATIENT EDUCATION:    Education details: Showed pt anatomy images. Explained muscles attachments/ connection, physiology of deep core system/ spinal- thoracic-pelvis-lower kinetic chain as they relate to pt's presentation, Sx, and past Hx. Explained what and how these areas of deficits need to be restored to balance and function    See Therapeutic activity / neuromuscular re-education section  Answered pt's questions.   Person educated: Patient Education method: Explanation, Demonstration, Tactile cues, Verbal cues, and Handouts Education comprehension: verbalized understanding, returned demonstration, verbal cues required, tactile cues required, and needs further education     PLAN: PT FREQUENCY: 1x/week   PT DURATION: 10 weeks   PLANNED INTERVENTIONS:   Gait training;Stair training;Functional mobility training;DME Instruction;Therapeutic activities;Therapeutic exercise;Balance training;Neuromuscular re-education;Patient/family education;Vestibular;Visual/perceptual remediation/compensation;Passive range of motion;Moist Heat;Cryotherapy;Traction;Canalith  Repostioning;Joint Manipulations;Manual lymph drainage;Manual techniques;Scar mobilization;Energy conservation;Dry needling;ADLs/Self Care Home Management;Biofeedback;Electrical Stimulation;Taping    PLAN FOR NEXT SESSION: See clinical impression for plan     GOALS: Goals reviewed with patient? Yes  SHORT TERM GOALS: Target date: 12/31/2023    Pt will demo IND with HEP                    Baseline: Not IND            Goal status: INITIAL   LONG TERM GOALS: Target date: 02/11/2024    1.Pt will demo proper deep core coordination without chest breathing and optimal excursion of diaphragm/pelvic floor in order to  improve urgency and leakage with bending up yard work . Baseline: dyscoordination Goal status: INITIAL  2.  Pt will demo proper body mechanics in against gravity tasks and ADLs  work tasks, fitness  to minimize straining pelvic floor / back    Baseline: not IND, improper form that places strain on pelvic floor  Goal status: INITIAL    3. Pt will demo increased gait speed > 1.3 m/s with reciprocal gait pattern, longer stride length  in order to ambulate safely in community and build back up  to regular walking routine of 3 miles - 4-5 x a week  Baseline: decreased R stance, limited posterior rotation of R pelvis , 1.04 m/s  Goal status: INITIAL    4. Pt will demo levelled pelvic girdle and shoulder height in order to progress to deep core strengthening HEP and restore mobility at spine, pelvis, gait, posture minimize falls, and improve balance  Baseline: L shoulder and L iliac crest lowered  Goal status: INITIAL   5. Pt will improve PFDI-7 questionnaire to  pts  score change  to demo improved QOL  Baseline:    ( greater pts indicate greater negative impact on QOL)     pts  ( total)    pts  ( UIQ-7 )    pts  ( CRAIQ-7 )    pts  ( POPIQ-7 )  Baseline:  Goal status: INITIAL  6. Pt will improve AROM with reach behind back ( R thumb above level of L5) to be able to  reeach behind to clean after BMs and shoulder flexion > 130 deg ) for reaching above cabinets  Baseline:  reach hand behind back: R thumb at PSIS level, L T 5 level   shoulder flexion seated: 120 deg R, 160 deg L   Wiping bowel movements are also affected by her limitation with reaching hand behind back    Reaching up to cabinets   Goal Status: INITIAL:    7.  Pt will communicate with pulmonologist for sleep study to rule in / out OSA   Baseline:      Nocturia 2-3 x / night  Goal Status: MET Pt talked to pulmonologist and he did not recommend a sleep study   Pia Lupe Plump, PT 02/02/2024, 1:33 PM

## 2024-02-02 NOTE — Patient Instructions (Addendum)
 Feet care :  Self -feet massage   Handshake : fingers between toes, moving ballmounds/toes back and forth several times while other hand anchors at arch. Do the same at the hind/mid foot.  Heel to toes upward to a letter Big Letter T strokes to spread ballmounds and toes, several times, pinch between webs of toes  Run finger tips along top of foot between long bones comb between the bones    Wiggle toes and spread them out when relaxing   __   Walking with armswings, picking up knees and longer strides

## 2024-02-25 ENCOUNTER — Ambulatory Visit: Attending: Urology | Admitting: Physical Therapy

## 2024-02-29 ENCOUNTER — Ambulatory Visit: Attending: Urology | Admitting: Physical Therapy

## 2024-02-29 DIAGNOSIS — M25611 Stiffness of right shoulder, not elsewhere classified: Secondary | ICD-10-CM | POA: Insufficient documentation

## 2024-02-29 DIAGNOSIS — M533 Sacrococcygeal disorders, not elsewhere classified: Secondary | ICD-10-CM | POA: Diagnosis present

## 2024-02-29 DIAGNOSIS — M25562 Pain in left knee: Secondary | ICD-10-CM | POA: Diagnosis present

## 2024-02-29 DIAGNOSIS — R2689 Other abnormalities of gait and mobility: Secondary | ICD-10-CM | POA: Insufficient documentation

## 2024-02-29 DIAGNOSIS — G8929 Other chronic pain: Secondary | ICD-10-CM | POA: Insufficient documentation

## 2024-02-29 NOTE — Patient Instructions (Signed)
 Deep core level 1-2 ( handout)   __  L plantar fascia stretch by wall   __

## 2024-02-29 NOTE — Therapy (Addendum)
 " OUTPATIENT PHYSICAL THERAPY TREATMENT / RECERT    Patient Name: Mariah Hogan MRN: [id number] DOB:10/08/1950, 73 y.o., female Today's Date: 02/29/2024   PT End of Session - 02/29/24 0942     Visit Number 9    Number of Visits 19    Date for Recertification  05/09/24    PT Start Time 0940    PT Stop Time 1020    PT Time Calculation (min) 40 min    Activity Tolerance Patient tolerated treatment well;No increased pain    Behavior During Therapy WFL for tasks assessed/performed          Past Medical History:  Diagnosis Date   Anemia    Arthritis    Asthma    H/O YEARS AGO-NOW HAS COUGHING SPELLS WHICH IS WHY SHE HAS HER ALBUTEROL  INHALER   Basal cell carcinoma    R nose ([facility])   Basal cell carcinoma 01/29/2016   Right forehead above med brow   Basal cell carcinoma 10/12/2019   L nasal tip - [facility] 05/15/2020   BRCA gene mutation negative 08/12/2017   Variant of unknown significance at APC only abnormality.   Breast cancer (HCC) 08/04/2017   1.8 cm ER 90%, PR 20%, HER-2/neu negative invasive mammary carcinoma of the left upper outer quadrant.  MammaPrint: High risk.   Cancer (HCC)    [id number] basal cell carcinoma   Complication of anesthesia    HARD TIME WAKING UP   Dysplastic nevus 02/21/2010   Right mid pretibial, mild   Dysrhythmia    TACHYCARDIA   GERD (gastroesophageal reflux disease)    History of hiatal hernia    SMALL   Hyperlipidemia    Hypertension    PT STATES IN 01-18-18 PREOP PHONE INTERVIEW THAT HER BP HAS BEEN ELEVATED SINCE STARTING ON VALSARTAN- PCP SWITCHED PT BACK TO MAXIDE ON 01-18-18 AND PT STATES THAT SHE HAS LOST 6-10 POUNDS OF FLUID SINCE RESTARTING MAXIDE.     Personal history of chemotherapy    PONV (postoperative nausea and vomiting)    Sjogren's syndrome 07/2020   Past Surgical History:  Procedure Laterality Date   ABDOMINAL HYSTERECTOMY  1998   BIOPSY  03/27/2023   Procedure: BIOPSY;  Surgeon: Maryruth Ole DASEN, MD;   Location: [facility];  Service: Endoscopy;;   bladder tack  [phone]   BREAST BIOPSY Left 1990   BREAST BIOPSY Left 2018   core bx- neg   BREAST BIOPSY Left 08/04/2017   left UOQ 2 oclock INVASIVE DUCTAL CARCINOMA.   BREAST BIOPSY Right 08/2017   MRI biopsy, FIBROCYSTIC CHANGE AND USUAL DUCTAL HYPERPLASIA,, clip placed   BREAST BIOPSY Right 11/11/2023   MM RT BREAST BX W LOC DEV 1ST LESION IMAGE BX SPEC STEREO GUIDE 11/11/2023 [facility]   BREAST CYST EXCISION  x3   BREAST EXCISIONAL BIOPSY Bilateral    BREAST RECONSTRUCTION WITH PLACEMENT OF TISSUE EXPANDER AND FLEX HD (ACELLULAR HYDRATED DERMIS) Left 01/27/2018   Procedure: BREAST RECONSTRUCTION WITH PLACEMENT OF TISSUE EXPANDER AND FLEX HD (ACELLULAR HYDRATED DERMIS);  Surgeon: Lowery Estefana RAMAN, DO;  Location: [facility];  Service: Plastics;  Laterality: Left;   BREAST SURGERY Left 02/13/14   Fibrocystic changes, pseudo-angiomatous stromal hyperplasia. No atypia or malignancy.   CARPAL TUNNEL RELEASE Bilateral x2   CHOLECYSTECTOMY  2008   COLONOSCOPY WITH PROPOFOL  N/A 01/12/2015   Procedure: COLONOSCOPY WITH PROPOFOL ;  Surgeon: Lamar DASEN Holmes, MD;  Location: [facility];  Service: Endoscopy;  Laterality: N/A;   COLONOSCOPY WITH PROPOFOL  N/A  02/06/2020   Procedure: COLONOSCOPY WITH PROPOFOL ;  Surgeon: Maryruth Ole DASEN, MD;  Location: [facility] ENDOSCOPY;  Service: Endoscopy;  Laterality: N/A;   ESOPHAGEAL DILATION  03/27/2023   Procedure: ESOPHAGEAL DILATION;  Surgeon: Maryruth Ole DASEN, MD;  Location: [facility];  Service: Endoscopy;;   ESOPHAGOGASTRODUODENOSCOPY (EGD) WITH PROPOFOL  N/A 01/12/2015   Procedure: ESOPHAGOGASTRODUODENOSCOPY (EGD) WITH PROPOFOL ;  Surgeon: Lamar DASEN Holmes, MD;  Location: [facility] ENDOSCOPY;  Service: Endoscopy;  Laterality: N/A;   ESOPHAGOGASTRODUODENOSCOPY (EGD) WITH PROPOFOL  N/A 02/06/2020   Procedure: ESOPHAGOGASTRODUODENOSCOPY (EGD) WITH PROPOFOL ;  Surgeon: Maryruth Ole DASEN, MD;  Location:  [facility];  Service: Endoscopy;  Laterality: N/A;   ESOPHAGOGASTRODUODENOSCOPY (EGD) WITH PROPOFOL  N/A 03/27/2023   Procedure: ESOPHAGOGASTRODUODENOSCOPY (EGD) WITH PROPOFOL ;  Surgeon: Maryruth Ole DASEN, MD;  Location: [facility];  Service: Endoscopy;  Laterality: N/A;   LIPOSUCTION WITH LIPOFILLING Left 12/23/2018   Procedure: LIPOSUCTION WITH LIPOFILLING FROM ABDOMEN TO LEFT BREAST;  Surgeon: Lowery Estefana RAMAN, DO;  Location: [facility];  Service: Plastics;  Laterality: Left;  90 min, please   MASTECTOMY Left 2019   [facility]   MASTECTOMY W/ SENTINEL NODE BIOPSY Left 01/27/2018   ypT1c ypN0; ER 90%, PR 20%, HER-2/neu not overexpressed.  MASTECTOMY WITH SENTINEL LYMPH NODE BIOPSY;  Surgeon: Dessa Reyes ORN, MD;  Location: [facility];  Service: General;  Laterality: Left;   MINOR BREAST BIOPSY Right 09/02/2017   Radiology perform procedure, fibrocystic changes right retroareolar area.   NASAL RECONSTRUCTION  1983   OOPHORECTOMY     POLYPECTOMY  03/27/2023   Procedure: POLYPECTOMY;  Surgeon: Maryruth Ole DASEN, MD;  Location: [facility];  Service: Endoscopy;;   PORT-A-CATH REMOVAL     PORTACATH PLACEMENT N/A 10/21/2017   Procedure: INSERTION PORT-A-CATH;  Surgeon: Dessa Reyes ORN, MD;  Location: [facility];  Service: General;  Laterality: N/A;   REMOVAL OF TISSUE EXPANDER AND PLACEMENT OF IMPLANT Left 05/13/2018   Procedure: REMOVAL OF TISSUE EXPANDER AND PLACEMENT OF IMPLANT;  Surgeon: Lowery Estefana RAMAN, DO;  Location: [facility];  Service: Plastics;  Laterality: Left;  total surgery time should be 3 hours, per provider   REVERSE SHOULDER ARTHROPLASTY Right 11/27/2020   Procedure: REVERSE SHOULDER ARTHROPLASTY;  Surgeon: Edie Norleen PARAS, MD;  Location: [facility];  Service: Orthopedics;  Laterality: Right;   SAVORY DILATION N/A 01/12/2015   Procedure: SAVORY DILATION;  Surgeon: Lamar DASEN Holmes, MD;  Location: [facility] ENDOSCOPY;  Service: Endoscopy;  Laterality: N/A;    TONSILLECTOMY AND ADENOIDECTOMY  1956   Patient Active Problem List   Diagnosis Date Noted   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   Status post reverse arthroplasty of shoulder, right 11/27/2020   Carcinoma of overlapping sites of left breast in female, estrogen receptor positive (HCC) 07/30/2020   Personal history of other malignant neoplasm of skin 11/17/2019   Complex tear of medial meniscus of left knee as current injury 08/10/2019   Primary osteoarthritis of left knee 08/10/2019   S/P breast reconstruction, left 04/15/2019   Acquired absence of left breast 12/23/2018   Breast asymmetry following reconstructive surgery 08/31/2018   Acquired absence of breast 02/09/2018   S/P mastectomy, left 02/02/2018   Breast cancer (HCC) 01/27/2018   Thrush 11/05/2017   Goals of care, counseling/discussion 10/09/2017   Hypokalemia 10/09/2017   Acute renal failure (ARF) 09/11/2017   Diarrhea 09/11/2017   Encounter for antineoplastic chemotherapy 09/03/2017   Nodule of upper lobe of right lung 08/24/2017   Osteopenia 08/24/2017   Malignant neoplasm of left female  breast (HCC) 08/07/2017   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   Incomplete emptying of bladder 09/03/2016   SUI (stress urinary incontinence, female) 09/03/2016   Mass of right breast 02/14/2014   Mass of upper outer quadrant of left breast 01/20/2014   Arthritis 12/28/2013   Asthma without status asthmaticus 12/28/2013   Esophageal reflux 12/28/2013   Hyperlipidemia 12/28/2013   Hypertension 05/09/2003    PCP: [facility]   REFERRING PROVIDER: Stoiff   REFERRING DIAG: Overactive bladder with urge incontinence/stress incontinence for pelvic floor PT   Rationale for Evaluation and Treatment Rehabilitation  THERAPY DIAG:  Sacrococcygeal disorders, not elsewhere classified  Other abnormalities of gait and mobility  Chronic pain of left knee  Stiffness of right shoulder, not elsewhere  classified  ONSET DATE:   SUBJECTIVE:       SUBJECTIVE STATEMENT TODAY: Pt reported  the exercise from last session caused increased LBP and L knee pain that lasted for 3 days.   Pt stopped doing the exericse  Pt had leakage when coughing from her respiratory infection.    SUBJECTIVE STATEMENT ON EVAL 12/03/23 : 1) Urinary Leakage:  pt doe snot wear pads. Pt was going through 5-6 pads before she started urinary medication.  Now pt has urgency. Pt has leakage with bending up yard work .  Nocturia 2-3 x / night    2) L knee pain - Pt was suppose to get L TKA but then she held it off because she had get R shoulder surgery. Pt used to walk 3 miles across 4-5 x a week but she has stopped due to pain. 5/10 level  with going up stairs,  getting into SUVs, bending   3) R shoulder stiffness:  Pt had total shoulder reverse placement and underwent PT. Pt is still limited in not raising as high and behind her back like she used to. Pt has dropped a few things. Wiping bowel movements are also affected by her limitation with reaching hand behind back    PERTINENT HISTORY:  L mastectomy with breat implant for breast CA R shoulder surgery  Gall bladder removal  3 Bladder slings surgeries Hysterectomy Basal cell cancer    PAIN:  Are you having pain? Yes: see above  PRECAUTIONS: No  WEIGHT BEARING RESTRICTIONS: No   FALLS:  Has patient fallen in last 6 months? No   LIVING ENVIRONMENT: Lives with: alone  Lives in: one story  Stairs: 4 STE with rail   OCCUPATION: retired psychologist, occupational ,    PLOF: IND   PATIENT GOALS:   Improve leakage, R shoulder ROM, and decrease L knee pain and return walking routine     OBJECTIVE:    [facility] PT Assessment - 02/29/24 0949       Palpation   SI assessment  levelled shoulder/ pelvis    Palpation comment hypomobile midfoot joints, tib/fib, plantar fascia, tenderness at intermediate/lateral cuneirform L      Ambulation/Gait   Gait Comments 1.17 m/s (  armswings, reciprocal gait          [facility] Adult PT Treatment/Exercise - 02/29/24 0949       Neuro Re-ed    Neuro Re-ed Details  propioceptive and motor planning for deep core training level 1-2      Manual Therapy   Manual therapy comments distraction, PA/AP mob Grade II, STM/MWM at plantar fascia, between rays, ant tib R,   STM/MWW at T10-12 , intercostal T10-11 to improve mobility and excursion of diaphragm  HOME EXERCISE PROGRAM: See pt instruction section    ASSESSMENT:  CLINICAL IMPRESSION: Pt achieved 2/7 goals  Pt showed levelled pelvis and shoulders are levelled and no longer has forward head/ thoracic kyphosis with manual Tx that addressed curves in spine and also with   shoe lift in toe box and heel of R shoe Only getting up 1 x a night to urinate instead of 2-3 x night  Improving gait speed  Increased R shoulder flexion AROM  Pt still benefits from skilled PT to help minimize SUI and improved L knee pain, R shoulder AROM     Today applied more manual Tx to R foot today to improve DF/EV, toe abduction, knee /hip abd/ER  . Modified manual Tx to accommodate comfort. Pt demo'd improved DF/EV, toe abduction and knee ER/abd post Tx which will help with L knee. Pt continues to benefit from regional interdependent approach to improve pelvic function and LKC    Progressed today to deep core training because pt 's shoulder and pelvic alignment finally has been corrected and aligned.  Anticipate these improvements will help with R shoulder stiffness, optimize deep core function for pelvic floor , and L knee pain. Provided propioceptive and motor planning for deep core training level 1-2 after which pt showed improved coordination.   Anticipate these  help promote optimize IAP system for improved pelvic floor function, trunk stability, gait, balance, stabilization with mobility tasks.                                                    Pt benefits from skilled PT to  achieve remaining goals    OBJECTIVE IMPAIRMENTS decreased activity tolerance, decreased coordination, decreased endurance, decreased mobility, difficulty walking, decreased ROM, decreased strength, decreased safety awareness, hypomobility, increased muscle spasms, impaired flexibility, improper body mechanics, postural dysfunction, and pain, scar restrictions   ACTIVITY LIMITATIONS  self-care, home chores, community activities    PARTICIPATION LIMITATIONS:  community,  walking activities    PERSONAL FACTORS   affecting patient's functional outcome:    REHAB POTENTIAL: Good   CLINICAL DECISION MAKING: Evolving/moderate complexity   EVALUATION COMPLEXITY: Moderate    PATIENT EDUCATION:    Education details: Showed pt anatomy images. Explained muscles attachments/ connection, physiology of deep core system/ spinal- thoracic-pelvis-lower kinetic chain as they relate to pt's presentation, Sx, and past Hx. Explained what and how these areas of deficits need to be restored to balance and function    See Therapeutic activity / neuromuscular re-education section  Answered pt's questions.   Person educated: Patient Education method: Explanation, Demonstration, Tactile cues, Verbal cues, and Handouts Education comprehension: verbalized understanding, returned demonstration, verbal cues required, tactile cues required, and needs further education     PLAN: PT FREQUENCY: 1x/week   PT DURATION: 10 weeks   PLANNED INTERVENTIONS:   Gait training;Stair training;Functional mobility training;DME Instruction;Therapeutic activities;Therapeutic exercise;Balance training;Neuromuscular re-education;Patient/family education;Vestibular;Visual/perceptual remediation/compensation;Passive range of motion;Moist Heat;Cryotherapy;Traction;Canalith Repostioning;Joint Manipulations;Manual lymph drainage;Manual techniques;Scar mobilization;Energy conservation;Dry needling;ADLs/Self Care Home  Management;Biofeedback;Electrical Stimulation;Taping    PLAN FOR NEXT SESSION: See clinical impression for plan     GOALS: Goals reviewed with patient? Yes  SHORT TERM GOALS: Target date: 12/31/2023    Pt will demo IND with HEP                    Baseline: Not IND  Goal status: INITIAL   LONG TERM GOALS: Target date: 05/09/2024      1.Pt will demo proper deep core coordination without chest breathing and optimal excursion of diaphragm/pelvic floor in order to  improve urgency and leakage with bending up yard work . Baseline: dyscoordination Goal status:  Ongoing   2.  Pt will demo proper body mechanics in against gravity tasks and ADLs  work tasks, fitness  to minimize straining pelvic floor / back    Baseline: not IND, improper form that places strain on pelvic floor  Goal status:  MET     3. Pt will demo increased gait speed > 1.3 m/s with reciprocal gait pattern, longer stride length  in order to ambulate safely in community and build back up  to regular walking routine of 3 miles - 4-5 x a week  Baseline: decreased R stance, limited posterior rotation of R pelvis , 1.04 m/s  Goal status:   ongoing ( 02/29/24: 1.17 m/s reciprocal)    4. Pt will demo levelled pelvic girdle and shoulder height in order to progress to deep core strengthening HEP and restore mobility at spine, pelvis, gait, posture minimize falls, and improve balance  Baseline: L shoulder and L iliac crest lowered  Goal status:  MET    5. Pt will improve PFDI-7 questionnaire to  pts  score change  to demo improved QOL  Baseline:    ( greater pts indicate greater negative impact on QOL)     pts  ( total)    pts  ( UIQ-7 )    pts  ( CRAIQ-7 )    pts  ( POPIQ-7 )  Baseline:  Goal status: DEFERRED, not assessed   6. Pt will improve AROM with reach behind back ( R thumb above level of L5) to be able to reeach behind to clean after BMs and shoulder flexion > 130 deg ) for reaching above  cabinets  Baseline:  reach hand behind back: R thumb at PSIS level, L T 5 level   shoulder flexion seated: 120 deg R, 160 deg L    Wiping bowel movements are also affected by her limitation with reaching hand behind back    Reaching up to cabinets   Goal Status:  Partial yet ( 130 deg shoulder flexion R but reach behind back still limited)     7.  Pt will communicate with pulmonologist for sleep study to rule in / out OSA   Baseline:      Nocturia 2-3 x / night  Goal Status: MET Pt talked to pulmonologist and he did not recommend a sleep study.  ( Pt gets up 1 x night)       Pia Lupe Plump, PT 02/29/2024, 9:43 AM  "

## 2024-03-15 ENCOUNTER — Encounter: Payer: Self-pay | Admitting: Hematology and Oncology

## 2024-03-16 ENCOUNTER — Ambulatory Visit: Attending: Urology | Admitting: Physical Therapy

## 2024-03-16 DIAGNOSIS — R2689 Other abnormalities of gait and mobility: Secondary | ICD-10-CM | POA: Diagnosis present

## 2024-03-16 DIAGNOSIS — M533 Sacrococcygeal disorders, not elsewhere classified: Secondary | ICD-10-CM | POA: Diagnosis present

## 2024-03-16 DIAGNOSIS — M25611 Stiffness of right shoulder, not elsewhere classified: Secondary | ICD-10-CM | POA: Insufficient documentation

## 2024-03-16 DIAGNOSIS — G8929 Other chronic pain: Secondary | ICD-10-CM | POA: Diagnosis present

## 2024-03-16 DIAGNOSIS — M25562 Pain in left knee: Secondary | ICD-10-CM | POA: Diagnosis present

## 2024-03-16 NOTE — Therapy (Signed)
 " OUTPATIENT PHYSICAL THERAPY TREATMENT / Progress Note across 10 visits from 12/03/23 to 03/16/24    Patient Name: Mariah Hogan MRN: [id number] DOB:[birth year], 74 y.o., female Today's Date: 03/16/2024   PT End of Session - 03/16/24 1115     Visit Number 10    Number of Visits 19    Date for Recertification  05/09/24    PT Start Time 1110    PT Stop Time 1150    PT Time Calculation (min) 40 min    Activity Tolerance Patient tolerated treatment well;No increased pain    Behavior During Therapy WFL for tasks assessed/performed          Past Medical History:  Diagnosis Date   Anemia    Arthritis    Asthma    H/O YEARS AGO-NOW HAS COUGHING SPELLS WHICH IS WHY SHE HAS HER ALBUTEROL  INHALER   Basal cell carcinoma    R nose ([facility])   Basal cell carcinoma 01/29/2016   Right forehead above med brow   Basal cell carcinoma 10/12/2019   L nasal tip - [facility] 05/15/2020   BRCA gene mutation negative 08/12/2017   Variant of unknown significance at APC only abnormality.   Breast cancer (HCC) 08/04/2017   1.8 cm ER 90%, PR 20%, HER-2/neu negative invasive mammary carcinoma of the left upper outer quadrant.  MammaPrint: High risk.   Cancer (HCC)    [phone] basal cell carcinoma   Complication of anesthesia    HARD TIME WAKING UP   Dysplastic nevus 02/21/2010   Right mid pretibial, mild   Dysrhythmia    TACHYCARDIA   GERD (gastroesophageal reflux disease)    History of hiatal hernia    SMALL   Hyperlipidemia    Hypertension    PT STATES IN 01-18-18 PREOP PHONE INTERVIEW THAT HER BP HAS BEEN ELEVATED SINCE STARTING ON VALSARTAN- PCP SWITCHED PT BACK TO MAXIDE ON 01-18-18 AND PT STATES THAT SHE HAS LOST 6-10 POUNDS OF FLUID SINCE RESTARTING MAXIDE.     Personal history of chemotherapy    PONV (postoperative nausea and vomiting)    Sjogren's syndrome 07/2020   Past Surgical History:  Procedure Laterality Date   ABDOMINAL HYSTERECTOMY  1998   BIOPSY  03/27/2023   Procedure:  BIOPSY;  Surgeon: Maryruth Ole DASEN, MD;  Location: [facility];  Service: Endoscopy;;   bladder tack  [phone]   BREAST BIOPSY Left 1990   BREAST BIOPSY Left 2018   core bx- neg   BREAST BIOPSY Left 08/04/2017   left UOQ 2 oclock INVASIVE DUCTAL CARCINOMA.   BREAST BIOPSY Right 08/2017   MRI biopsy, FIBROCYSTIC CHANGE AND USUAL DUCTAL HYPERPLASIA,, clip placed   BREAST BIOPSY Right 11/11/2023   MM RT BREAST BX W LOC DEV 1ST LESION IMAGE BX SPEC STEREO GUIDE 11/11/2023 [facility]   BREAST CYST EXCISION  x3   BREAST EXCISIONAL BIOPSY Bilateral    BREAST RECONSTRUCTION WITH PLACEMENT OF TISSUE EXPANDER AND FLEX HD (ACELLULAR HYDRATED DERMIS) Left 01/27/2018   Procedure: BREAST RECONSTRUCTION WITH PLACEMENT OF TISSUE EXPANDER AND FLEX HD (ACELLULAR HYDRATED DERMIS);  Surgeon: Lowery Estefana RAMAN, DO;  Location: [facility];  Service: Plastics;  Laterality: Left;   BREAST SURGERY Left 02/13/14   Fibrocystic changes, pseudo-angiomatous stromal hyperplasia. No atypia or malignancy.   CARPAL TUNNEL RELEASE Bilateral x2   CHOLECYSTECTOMY  2008   COLONOSCOPY WITH PROPOFOL  N/A 01/12/2015   Procedure: COLONOSCOPY WITH PROPOFOL ;  Surgeon: Lamar DASEN Holmes, MD;  Location[facility];  Service: Endoscopy;  Laterality: N/A;   COLONOSCOPY WITH PROPOFOL  N/A 02/06/2020   Procedure: COLONOSCOPY WITH PROPOFOL ;  Surgeon: Maryruth Ole DASEN, MD;  Location: [facility];  Service: Endoscopy;  Laterality: N/A;   ESOPHAGEAL DILATION  03/27/2023   Procedure: ESOPHAGEAL DILATION;  Surgeon: Maryruth Ole DASEN, MD;  Location: [facility];  Service: Endoscopy;;   ESOPHAGOGASTRODUODENOSCOPY (EGD) WITH PROPOFOL  N/A 01/12/2015   Procedure: ESOPHAGOGASTRODUODENOSCOPY (EGD) WITH PROPOFOL ;  Surgeon: Lamar DASEN Holmes, MD;  Location[facility] ENDOSCOPY;  Service: Endoscopy;  Laterality: N/A;   ESOPHAGOGASTRODUODENOSCOPY (EGD) WITH PROPOFOL  N/A 02/06/2020   Procedure: ESOPHAGOGASTRODUODENOSCOPY (EGD) WITH PROPOFOL ;   Surgeon: Maryruth Ole DASEN, MD;  Location: [facility];  Service: Endoscopy;  Laterality: N/A;   ESOPHAGOGASTRODUODENOSCOPY (EGD) WITH PROPOFOL  N/A 03/27/2023   Procedure: ESOPHAGOGASTRODUODENOSCOPY (EGD) WITH PROPOFOL ;  Surgeon: Maryruth Ole DASEN, MD;  Location: [facility];  Service: Endoscopy;  Laterality: N/A;   LIPOSUCTION WITH LIPOFILLING Left 12/23/2018   Procedure: LIPOSUCTION WITH LIPOFILLING FROM ABDOMEN TO LEFT BREAST;  Surgeon: Lowery Estefana RAMAN, DO;  Location: [facility];  Service: Plastics;  Laterality: Left;  90 min, please   MASTECTOMY Left 2019   [facility]   MASTECTOMY W/ SENTINEL NODE BIOPSY Left 01/27/2018   ypT1c ypN0; ER 90%, PR 20%, HER-2/neu not overexpressed.  MASTECTOMY WITH SENTINEL LYMPH NODE BIOPSY;  Surgeon: Dessa Reyes ORN, MD;  Location: [facility];  Service: General;  Laterality: Left;   MINOR BREAST BIOPSY Right 09/02/2017   Radiology perform procedure, fibrocystic changes right retroareolar area.   NASAL RECONSTRUCTION  1983   OOPHORECTOMY     POLYPECTOMY  03/27/2023   Procedure: POLYPECTOMY;  Surgeon: Maryruth Ole DASEN, MD;  Location: [facility];  Service: Endoscopy;;   PORT-A-CATH REMOVAL     PORTACATH PLACEMENT N/A 10/21/2017   Procedure: INSERTION PORT-A-CATH;  Surgeon: Dessa Reyes ORN, MD;  Location: [facility];  Service: General;  Laterality: N/A;   REMOVAL OF TISSUE EXPANDER AND PLACEMENT OF IMPLANT Left 05/13/2018   Procedure: REMOVAL OF TISSUE EXPANDER AND PLACEMENT OF IMPLANT;  Surgeon: Lowery Estefana RAMAN, DO;  Location: [facility];  Service: Plastics;  Laterality: Left;  total surgery time should be 3 hours, per provider   REVERSE SHOULDER ARTHROPLASTY Right 11/27/2020   Procedure: REVERSE SHOULDER ARTHROPLASTY;  Surgeon: Edie Norleen PARAS, MD;  Location: [facility];  Service: Orthopedics;  Laterality: Right;   SAVORY DILATION N/A 01/12/2015   Procedure: SAVORY DILATION;  Surgeon: Lamar DASEN Holmes, MD;  Location: [facility] ENDOSCOPY;   Service: Endoscopy;  Laterality: N/A;   TONSILLECTOMY AND ADENOIDECTOMY  1956   Patient Active Problem List   Diagnosis Date Noted   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   Status post reverse arthroplasty of shoulder, right 11/27/2020   Carcinoma of overlapping sites of left breast in female, estrogen receptor positive (HCC) 07/30/2020   Personal history of other malignant neoplasm of skin 11/17/2019   Complex tear of medial meniscus of left knee as current injury 08/10/2019   Primary osteoarthritis of left knee 08/10/2019   S/P breast reconstruction, left 04/15/2019   Acquired absence of left breast 12/23/2018   Breast asymmetry following reconstructive surgery 08/31/2018   Acquired absence of breast 02/09/2018   S/P mastectomy, left 02/02/2018   Breast cancer (HCC) 01/27/2018   Thrush 11/05/2017   Goals of care, counseling/discussion 10/09/2017   Hypokalemia 10/09/2017   Acute renal failure (ARF) 09/11/2017   Diarrhea 09/11/2017   Encounter for antineoplastic chemotherapy 09/03/2017   Nodule of upper lobe of right lung 08/24/2017   Osteopenia  08/24/2017   Malignant neoplasm of left female breast (HCC) 08/07/2017   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   Incomplete emptying of bladder 09/03/2016   SUI (stress urinary incontinence, female) 09/03/2016   Mass of right breast 02/14/2014   Mass of upper outer quadrant of left breast 01/20/2014   Arthritis 12/28/2013   Asthma without status asthmaticus 12/28/2013   Esophageal reflux 12/28/2013   Hyperlipidemia 12/28/2013   Hypertension 05/09/2003    PCP: [facility]   REFERRING PROVIDER: Stoiff   REFERRING DIAG: Overactive bladder with urge incontinence/stress incontinence for pelvic floor PT   Rationale for Evaluation and Treatment Rehabilitation  THERAPY DIAG:  Sacrococcygeal disorders, not elsewhere classified  Other abnormalities of gait and mobility  Chronic pain of left knee  Stiffness of right  shoulder, not elsewhere classified  ONSET DATE:   SUBJECTIVE:       SUBJECTIVE STATEMENT TODAY: Pt reported she continues to not do the exercise pulling knee to chest because it bothered her knee.   Going up steps bother her L knee.   Pt has had more leakage because she is still coughing because she still has a respiratory infection   Pt's doctor said because she has Sjorgen's Disease which impact imme system and delaying her recovery from the respiratory infection   SUBJECTIVE STATEMENT ON EVAL 12/03/23 : 1) Urinary Leakage:  pt does not wear pads. Pt was going through 5-6 pads before she started urinary medication.  Now pt has urgency. Pt has leakage with bending up yard work .  Nocturia 2-3 x / night    2) L knee pain - Pt was suppose to get L TKA but then she held it off because she had get R shoulder surgery. Pt used to walk 3 miles across 4-5 x a week but she has stopped due to pain. 5/10 level  with going up stairs,  getting into SUVs, bending   3) R shoulder stiffness:  Pt had total shoulder reverse placement and underwent PT. Pt is still limited in not raising as high and behind her back like she used to. Pt has dropped a few things. Wiping bowel movements are also affected by her limitation with reaching hand behind back    PERTINENT HISTORY:  L mastectomy with breat implant for breast CA R shoulder surgery  Gall bladder removal  3 Bladder slings surgeries Hysterectomy Basal cell cancer    PAIN:  Are you having pain? Yes: see above  PRECAUTIONS: No  WEIGHT BEARING RESTRICTIONS: No   FALLS:  Has patient fallen in last 6 months? No   LIVING ENVIRONMENT: Lives with: alone  Lives in: one story  Stairs: 4 STE with rail   OCCUPATION: retired psychologist, occupational ,    PLOF: IND   PATIENT GOALS:   Improve leakage, R shoulder ROM, and decrease L knee pain and return walking routine     OBJECTIVE:   [facility] PT Assessment -       Coordination   Coordination and Movement  Description initially ab mm overuse, posterior tilt of pelvis with deep core coordination      6 minute walk test results    Endurance additional comments 925 ft, pre/post Tx:Sat O2: 96% / 97%, HR:  85 bpm, 96 bpm, BORG 5/10  ( pt reports BORG 5/10 with walking to her mailbox)           [facility] Adult PT Treatment/Exercise - 03/17/24 0932       Ambulation/Gait  Gait Comments narrow BOS, limited hip/ knee flexion, reported tightness along IT band around 4 min of 6 MWT, less tightness at ITB nad after cues for wider COM and highr knees, longer stride length      Self-Care   Self-Care Other Self-Care Comments    Other Self-Care Comments  explained deep core training, showed anatomy , explained importance of walking 6 min inside house levelled ground to help with aerobic/ pulmonary health      Therapeutic Activites    Therapeutic Activities Other Therapeutic Activities    Other Therapeutic Activities provided cues for hip flexor and calf stretches, modifed quad stretch in chair to promoteflexibilty      Neuro Re-ed    Neuro Re-ed Details  provided propioceptive coordination cues and motor planning cues with awareness of stability points, LKC< anterio rtilt of pelvis for deep core level 1-2            HOME EXERCISE PROGRAM: See pt instruction section    ASSESSMENT:  CLINICAL IMPRESSION: Pt achieved 3 /8 goals     Pt showed levelled pelvis and shoulders are levelled and no longer has forward head/ thoracic kyphosis with manual Tx that addressed curves in spine and also with   shoe lift in toe box and heel of R shoe Only getting up 1 x a night to urinate instead of 2-3 x night  Improving gait speed  Increased R shoulder flexion AROM  Pt still benefits from skilled PT to help minimize SUI, L knee pain and R shoulder AROM .    Reviewed today  deep core training because pt 's shoulder and pelvic alignment finally has been corrected and aligned.  Anticipate these improvements will  help with SUI. Assessed 6 MWT today and encouraged return to walking. Pt used to walk 3 miles. Explained importance of walking 6 min inside house levelled ground to help with aerobic/ pulmonary health as pt is still recovering from respiratory infection which worsens  her incontinence due to coughing.  Plan to assess pt with stairs as pt reports L knee pain with stairs.   Anticipate these  help promote optimize IAP system for improved pelvic floor function, trunk stability, gait, balance, stabilization with mobility tasks.                                                    Pt benefits from skilled PT to achieve remaining goals    OBJECTIVE IMPAIRMENTS decreased activity tolerance, decreased coordination, decreased endurance, decreased mobility, difficulty walking, decreased ROM, decreased strength, decreased safety awareness, hypomobility, increased muscle spasms, impaired flexibility, improper body mechanics, postural dysfunction, and pain, scar restrictions   ACTIVITY LIMITATIONS  self-care, home chores, community activities    PARTICIPATION LIMITATIONS:  community,  walking activities    PERSONAL FACTORS   affecting patient's functional outcome:    REHAB POTENTIAL: Good   CLINICAL DECISION MAKING: Evolving/moderate complexity   EVALUATION COMPLEXITY: Moderate    PATIENT EDUCATION:    Education details: Showed pt anatomy images. Explained muscles attachments/ connection, physiology of deep core system/ spinal- thoracic-pelvis-lower kinetic chain as they relate to pt's presentation, Sx, and past Hx. Explained what and how these areas of deficits need to be restored to balance and function    See Therapeutic activity / neuromuscular re-education section  Answered pt's questions.   Person educated:  Patient Education method: Explanation, Demonstration, Tactile cues, Verbal cues, and Handouts Education comprehension: verbalized understanding, returned demonstration, verbal cues  required, tactile cues required, and needs further education     PLAN: PT FREQUENCY: 1x/week   PT DURATION: 10 weeks   PLANNED INTERVENTIONS:   Gait training;Stair training;Functional mobility training;DME Instruction;Therapeutic activities;Therapeutic exercise;Balance training;Neuromuscular re-education;Patient/family education;Vestibular;Visual/perceptual remediation/compensation;Passive range of motion;Moist Heat;Cryotherapy;Traction;Canalith Repostioning;Joint Manipulations;Manual lymph drainage;Manual techniques;Scar mobilization;Energy conservation;Dry needling;ADLs/Self Care Home Management;Biofeedback;Electrical Stimulation;Taping    PLAN FOR NEXT SESSION: See clinical impression for plan     GOALS: Goals reviewed with patient? Yes  SHORT TERM GOALS: Target date: 12/31/2023    Pt will demo IND with HEP                    Baseline: Not IND            Goal status: INITIAL   LONG TERM GOALS: Target date: 05/09/2024      1.Pt will demo proper deep core coordination without chest breathing and optimal excursion of diaphragm/pelvic floor in order to  improve urgency and leakage with bending up yard work . Baseline: dyscoordination Goal status:  Ongoing   2.  Pt will demo proper body mechanics in against gravity tasks and ADLs  work tasks, fitness  to minimize straining pelvic floor / back    Baseline: not IND, improper form that places strain on pelvic floor  Goal status:  MET     3. Pt will demo increased gait speed > 1.3 m/s with reciprocal gait pattern, longer stride length  in order to ambulate safely in community and build back up  to regular walking routine of 3 miles - 4-5 x a week  Baseline: decreased R stance, limited posterior rotation of R pelvis , 1.04 m/s  Goal status:   ongoing ( 02/29/24: 1.17 m/s reciprocal)    4. Pt will demo levelled pelvic girdle and shoulder height in order to progress to deep core strengthening HEP and restore mobility at spine,  pelvis, gait, posture minimize falls, and improve balance  Baseline: L shoulder and L iliac crest lowered  Goal status:  MET    5. Pt will improve PFDI-7 questionnaire to  pts  score change  to demo improved QOL  Baseline:    ( greater pts indicate greater negative impact on QOL)     pts  ( total)    pts  ( UIQ-7 )    pts  ( CRAIQ-7 )    pts  ( POPIQ-7 )  Baseline:  Goal status: DEFERRED, not assessed   6. Pt will improve AROM with reach behind back ( R thumb above level of L5) to be able to reeach behind to clean after BMs and shoulder flexion > 130 deg ) for reaching above cabinets  Baseline:  reach hand behind back: R thumb at PSIS level, L T 5 level   shoulder flexion seated: 120 deg R, 160 deg L    Wiping bowel movements are also affected by her limitation with reaching hand behind back    Reaching up to cabinets   Goal Status:  Partial yet ( 130 deg shoulder flexion R but reach behind back still limited)     7.  Pt will communicate with pulmonologist for sleep study to rule in / out OSA   Baseline:      Nocturia 2-3 x / night  Goal Status: MET Pt talked to pulmonologist and he did not recommend a sleep study.  (  Pt gets up 1 x night)    8.  Pt will demo 6 MWT increase distance > 1050, BORG < 4/10  Baseline:  925 ft, pre/post Tx:Sat O2: 96% / 97%, HR:  85 bpm, 96 bpm, BORG 5/10  ( pt reports BORG 5/10 with walking to her mailbox)   Goal Status:  INITIAL    Pia Lupe Plump, PT 03/16/2024, 11:16 AM  "

## 2024-03-17 NOTE — Patient Instructions (Signed)
 Walking 6 min inside home, 1- 2 x a day followed by stretches   Deep core level 1-2 2 x a day

## 2024-03-23 ENCOUNTER — Ambulatory Visit: Admitting: Physical Therapy

## 2024-03-23 DIAGNOSIS — M533 Sacrococcygeal disorders, not elsewhere classified: Secondary | ICD-10-CM

## 2024-03-23 DIAGNOSIS — G8929 Other chronic pain: Secondary | ICD-10-CM

## 2024-03-23 DIAGNOSIS — R2689 Other abnormalities of gait and mobility: Secondary | ICD-10-CM

## 2024-03-23 DIAGNOSIS — M25611 Stiffness of right shoulder, not elsewhere classified: Secondary | ICD-10-CM

## 2024-03-23 NOTE — Patient Instructions (Signed)
 Stretches :      Motion is lotion  - good morning and evening     On your side - winging and brushing   Pillow between knees and behind back     On your back with towel roll under the neck  - neck 6 directions - angel wings - ( dragging arms)  - ZigZag stretch scoot hips to one side and rock knees to opposite, arms by side ,palms up      On your other side - winging and brushing   Pillow between knees and behind back     Deep color level 1 and 2

## 2024-03-23 NOTE — Therapy (Signed)
 " OUTPATIENT PHYSICAL THERAPY TREATMENT   Patient Name: ANTANIA HOEFLING MRN: [id number] DOB:[birth year], 74 y.o., female Today's Date: 03/23/2024   PT End of Session - 03/23/24 0938     Visit Number 11    Number of Visits 19    Date for Recertification  05/09/24    PT Start Time 0935    PT Stop Time 1015    PT Time Calculation (min) 40 min    Activity Tolerance Patient tolerated treatment well;No increased pain    Behavior During Therapy WFL for tasks assessed/performed          Past Medical History:  Diagnosis Date   Anemia    Arthritis    Asthma    H/O YEARS AGO-NOW HAS COUGHING SPELLS WHICH IS WHY SHE HAS HER ALBUTEROL  INHALER   Basal cell carcinoma    R nose ([facility])   Basal cell carcinoma 01/29/2016   Right forehead above med brow   Basal cell carcinoma 10/12/2019   L nasal tip - [facility] 05/15/2020   BRCA gene mutation negative 08/12/2017   Variant of unknown significance at APC only abnormality.   Breast cancer (HCC) 08/04/2017   1.8 cm ER 90%, PR 20%, HER-2/neu negative invasive mammary carcinoma of the left upper outer quadrant.  MammaPrint: High risk.   Cancer (HCC)    [phone] basal cell carcinoma   Complication of anesthesia    HARD TIME WAKING UP   Dysplastic nevus 02/21/2010   Right mid pretibial, mild   Dysrhythmia    TACHYCARDIA   GERD (gastroesophageal reflux disease)    History of hiatal hernia    SMALL   Hyperlipidemia    Hypertension    PT STATES IN 01-18-18 PREOP PHONE INTERVIEW THAT HER BP HAS BEEN ELEVATED SINCE STARTING ON VALSARTAN- PCP SWITCHED PT BACK TO MAXIDE ON 01-18-18 AND PT STATES THAT SHE HAS LOST 6-10 POUNDS OF FLUID SINCE RESTARTING MAXIDE.     Personal history of chemotherapy    PONV (postoperative nausea and vomiting)    Sjogren's syndrome 07/2020   Past Surgical History:  Procedure Laterality Date   ABDOMINAL HYSTERECTOMY  1998   BIOPSY  03/27/2023   Procedure: BIOPSY;  Surgeon: Maryruth Ole DASEN, MD;  Location: [facility]  ENDOSCOPY;  Service: Endoscopy;;   bladder tack  [phone]   BREAST BIOPSY Left 1990   BREAST BIOPSY Left 2018   core bx- neg   BREAST BIOPSY Left 08/04/2017   left UOQ 2 oclock INVASIVE DUCTAL CARCINOMA.   BREAST BIOPSY Right 08/2017   MRI biopsy, FIBROCYSTIC CHANGE AND USUAL DUCTAL HYPERPLASIA,, clip placed   BREAST BIOPSY Right 11/11/2023   MM RT BREAST BX W LOC DEV 1ST LESION IMAGE BX SPEC STEREO GUIDE 11/11/2023 [facility]   BREAST CYST EXCISION  x3   BREAST EXCISIONAL BIOPSY Bilateral    BREAST RECONSTRUCTION WITH PLACEMENT OF TISSUE EXPANDER AND FLEX HD (ACELLULAR HYDRATED DERMIS) Left 01/27/2018   Procedure: BREAST RECONSTRUCTION WITH PLACEMENT OF TISSUE EXPANDER AND FLEX HD (ACELLULAR HYDRATED DERMIS);  Surgeon: Lowery Estefana RAMAN, DO;  Location: [facility] ORS;  Service: Plastics;  Laterality: Left;   BREAST SURGERY Left 02/13/14   Fibrocystic changes, pseudo-angiomatous stromal hyperplasia. No atypia or malignancy.   CARPAL TUNNEL RELEASE Bilateral x2   CHOLECYSTECTOMY  2008   COLONOSCOPY WITH PROPOFOL  N/A 01/12/2015   Procedure: COLONOSCOPY WITH PROPOFOL ;  Surgeon: Lamar DASEN Holmes, MD;  Location: [facility] ENDOSCOPY;  Service: Endoscopy;  Laterality: N/A;   COLONOSCOPY WITH PROPOFOL  N/A 02/06/2020  Procedure: COLONOSCOPY WITH PROPOFOL ;  Surgeon: Maryruth Ole DASEN, MD;  Location: [facility] ENDOSCOPY;  Service: Endoscopy;  Laterality: N/A;   ESOPHAGEAL DILATION  03/27/2023   Procedure: ESOPHAGEAL DILATION;  Surgeon: Maryruth Ole DASEN, MD;  Location: [facility] ENDOSCOPY;  Service: Endoscopy;;   ESOPHAGOGASTRODUODENOSCOPY (EGD) WITH PROPOFOL  N/A 01/12/2015   Procedure: ESOPHAGOGASTRODUODENOSCOPY (EGD) WITH PROPOFOL ;  Surgeon: Lamar DASEN Holmes, MD;  Location: [facility] ENDOSCOPY;  Service: Endoscopy;  Laterality: N/A;   ESOPHAGOGASTRODUODENOSCOPY (EGD) WITH PROPOFOL  N/A 02/06/2020   Procedure: ESOPHAGOGASTRODUODENOSCOPY (EGD) WITH PROPOFOL ;  Surgeon: Maryruth Ole DASEN, MD;  Location: [facility] ENDOSCOPY;   Service: Endoscopy;  Laterality: N/A;   ESOPHAGOGASTRODUODENOSCOPY (EGD) WITH PROPOFOL  N/A 03/27/2023   Procedure: ESOPHAGOGASTRODUODENOSCOPY (EGD) WITH PROPOFOL ;  Surgeon: Maryruth Ole DASEN, MD;  Location: [facility] ENDOSCOPY;  Service: Endoscopy;  Laterality: N/A;   LIPOSUCTION WITH LIPOFILLING Left 12/23/2018   Procedure: LIPOSUCTION WITH LIPOFILLING FROM ABDOMEN TO LEFT BREAST;  Surgeon: Lowery Estefana RAMAN, DO;  Location: [facility];  Service: Plastics;  Laterality: Left;  90 min, please   MASTECTOMY Left 2019   [facility]   MASTECTOMY W/ SENTINEL NODE BIOPSY Left 01/27/2018   ypT1c ypN0; ER 90%, PR 20%, HER-2/neu not overexpressed.  MASTECTOMY WITH SENTINEL LYMPH NODE BIOPSY;  Surgeon: Dessa Reyes ORN, MD;  Location: [facility] ORS;  Service: General;  Laterality: Left;   MINOR BREAST BIOPSY Right 09/02/2017   Radiology perform procedure, fibrocystic changes right retroareolar area.   NASAL RECONSTRUCTION  1983   OOPHORECTOMY     POLYPECTOMY  03/27/2023   Procedure: POLYPECTOMY;  Surgeon: Maryruth Ole DASEN, MD;  Location: [facility] ENDOSCOPY;  Service: Endoscopy;;   PORT-A-CATH REMOVAL     PORTACATH PLACEMENT N/A 10/21/2017   Procedure: INSERTION PORT-A-CATH;  Surgeon: Dessa Reyes ORN, MD;  Location: [facility] ORS;  Service: General;  Laterality: N/A;   REMOVAL OF TISSUE EXPANDER AND PLACEMENT OF IMPLANT Left 05/13/2018   Procedure: REMOVAL OF TISSUE EXPANDER AND PLACEMENT OF IMPLANT;  Surgeon: Lowery Estefana RAMAN, DO;  Location: [facility];  Service: Plastics;  Laterality: Left;  total surgery time should be 3 hours, per provider   REVERSE SHOULDER ARTHROPLASTY Right 11/27/2020   Procedure: REVERSE SHOULDER ARTHROPLASTY;  Surgeon: Edie Norleen PARAS, MD;  Location: [facility] ORS;  Service: Orthopedics;  Laterality: Right;   SAVORY DILATION N/A 01/12/2015   Procedure: SAVORY DILATION;  Surgeon: Lamar DASEN Holmes, MD;  Location: [facility] ENDOSCOPY;  Service: Endoscopy;  Laterality: N/A;   TONSILLECTOMY AND  ADENOIDECTOMY  1956   Patient Active Problem List   Diagnosis Date Noted   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   Status post reverse arthroplasty of shoulder, right 11/27/2020   Carcinoma of overlapping sites of left breast in female, estrogen receptor positive (HCC) 07/30/2020   Personal history of other malignant neoplasm of skin 11/17/2019   Complex tear of medial meniscus of left knee as current injury 08/10/2019   Primary osteoarthritis of left knee 08/10/2019   S/P breast reconstruction, left 04/15/2019   Acquired absence of left breast 12/23/2018   Breast asymmetry following reconstructive surgery 08/31/2018   Acquired absence of breast 02/09/2018   S/P mastectomy, left 02/02/2018   Breast cancer (HCC) 01/27/2018   Thrush 11/05/2017   Goals of care, counseling/discussion 10/09/2017   Hypokalemia 10/09/2017   Acute renal failure (ARF) 09/11/2017   Diarrhea 09/11/2017   Encounter for antineoplastic chemotherapy 09/03/2017   Nodule of upper lobe of right lung 08/24/2017   Osteopenia 08/24/2017   Malignant neoplasm of left female breast (HCC) 08/07/2017  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   Incomplete emptying of bladder 09/03/2016   SUI (stress urinary incontinence, female) 09/03/2016   Mass of right breast 02/14/2014   Mass of upper outer quadrant of left breast 01/20/2014   Arthritis 12/28/2013   Asthma without status asthmaticus 12/28/2013   Esophageal reflux 12/28/2013   Hyperlipidemia 12/28/2013   Hypertension 05/09/2003    PCP: [facility]   REFERRING PROVIDER: Stoiff   REFERRING DIAG: Overactive bladder with urge incontinence/stress incontinence for pelvic floor PT   Rationale for Evaluation and Treatment Rehabilitation  THERAPY DIAG:  Sacrococcygeal disorders, not elsewhere classified  Other abnormalities of gait and mobility  Chronic pain of left knee  Stiffness of right shoulder, not elsewhere classified  ONSET DATE:    SUBJECTIVE:       SUBJECTIVE STATEMENT TODAY: Pt reported she walked 6 min everyday in her house. She felt winded at the end but able to do it each time with manageable L knee pain. Pt walked wider with her feet and she noticed no more lateral hip pain last week.   SUBJECTIVE STATEMENT ON EVAL 12/03/23 : 1) Urinary Leakage:  pt does not wear pads. Pt was going through 5-6 pads before she started urinary medication.  Now pt has urgency. Pt has leakage with bending up yard work .  Nocturia 2-3 x / night    2) L knee pain - Pt was suppose to get L TKA but then she held it off because she had get R shoulder surgery. Pt used to walk 3 miles across 4-5 x a week but she has stopped due to pain. 5/10 level  with going up stairs,  getting into SUVs, bending   3) R shoulder stiffness:  Pt had total shoulder reverse placement and underwent PT. Pt is still limited in not raising as high and behind her back like she used to. Pt has dropped a few things. Wiping bowel movements are also affected by her limitation with reaching hand behind back    PERTINENT HISTORY:  L mastectomy with breat implant for breast CA R shoulder surgery  Gall bladder removal  3 Bladder slings surgeries Hysterectomy Basal cell cancer    PAIN:  Are you having pain? Yes: see above  PRECAUTIONS: No  WEIGHT BEARING RESTRICTIONS: No   FALLS:  Has patient fallen in last 6 months? No   LIVING ENVIRONMENT: Lives with: alone  Lives in: one story  Stairs: 4 STE with rail   OCCUPATION: retired psychologist, occupational ,    PLOF: IND   PATIENT GOALS:   Improve leakage, R shoulder ROM, and decrease L knee pain and return walking routine     OBJECTIVE:    [facility] PT Assessment - 03/23/24 1020       Observation/Other Assessments   Observations pt able to lie without a pilow under head which indicates significantly improved posture ( no more thoracic kyphosis)      Coordination   Coordination and Movement Description excessive  overuse of L upper trap, B rounded shoulder      Palpation   Spinal mobility L shoulder lowered, T10 deviated to L, C3-5 hypomobile L,  scalenes toghtness posterior/ middle, SCM,          [facility] Adult PT Treatment/Exercise - 03/23/24 1022       Self-Care   Self-Care Other Self-Care Comments    Other Self-Care Comments  created a chart for all stretches along spine to maintain upright posture for optimal IAP  system for SUI      Neuro Re-ed    Neuro Re-ed Details  excessive cues for propioception to achieve cervical retraction and depresison of upper trap      Manual Therapy   Manual therapy comments STM/MWM at problem areas noted in assessment to promote cervical retraction and stacked posture             HOME EXERCISE PROGRAM: See pt instruction section    ASSESSMENT:  CLINICAL IMPRESSION: Pt achieved 3 /8 goals     Pt showed levelled pelvis and shoulders are levelled and no longer has forward head/ thoracic kyphosis with manual Tx that addressed curves in spine and also with   shoe lift in toe box and heel of R shoe Only getting up 1 x a night to urinate instead of 2-3 x night  Improving gait speed  Increased R shoulder flexion AROM  Pt still benefits from skilled PT to help minimize SUI, L knee pain and R shoulder AROM .   Today:  Pt continued to require manual Tx at C/T junction to minimize deviated segments T10 and C3-5 segments. Pt showed more ability to depress upper trap and scapular retraction post training with neuromuscular reeducation.  Pt also showed levelled shoulder alignment post training. Created a chart for all stretches along  spine to maintain upright posture for optimal IAP system for SUI . Plan to work on pelvic floor propioception with deep core level 1-2 next session  Plan to assess pt with stairs as pt reports L knee pain with stairs.   Anticipate these  help promote optimize IAP system for improved pelvic floor function, trunk stability, gait,  balance, stabilization with mobility tasks.                                                    Pt benefits from skilled PT to achieve remaining goals    OBJECTIVE IMPAIRMENTS decreased activity tolerance, decreased coordination, decreased endurance, decreased mobility, difficulty walking, decreased ROM, decreased strength, decreased safety awareness, hypomobility, increased muscle spasms, impaired flexibility, improper body mechanics, postural dysfunction, and pain, scar restrictions   ACTIVITY LIMITATIONS  self-care, home chores, community activities    PARTICIPATION LIMITATIONS:  community,  walking activities    PERSONAL FACTORS   affecting patient's functional outcome:    REHAB POTENTIAL: Good   CLINICAL DECISION MAKING: Evolving/moderate complexity   EVALUATION COMPLEXITY: Moderate    PATIENT EDUCATION:    Education details: Showed pt anatomy images. Explained muscles attachments/ connection, physiology of deep core system/ spinal- thoracic-pelvis-lower kinetic chain as they relate to pt's presentation, Sx, and past Hx. Explained what and how these areas of deficits need to be restored to balance and function    See Therapeutic activity / neuromuscular re-education section  Answered pt's questions.   Person educated: Patient Education method: Explanation, Demonstration, Tactile cues, Verbal cues, and Handouts Education comprehension: verbalized understanding, returned demonstration, verbal cues required, tactile cues required, and needs further education     PLAN: PT FREQUENCY: 1x/week   PT DURATION: 10 weeks   PLANNED INTERVENTIONS:   Gait training;Stair training;Functional mobility training;DME Instruction;Therapeutic activities;Therapeutic exercise;Balance training;Neuromuscular re-education;Patient/family education;Vestibular;Visual/perceptual remediation/compensation;Passive range of motion;Moist Heat;Cryotherapy;Traction;Canalith Repostioning;Joint  Manipulations;Manual lymph drainage;Manual techniques;Scar mobilization;Energy conservation;Dry needling;ADLs/Self Care Home Management;Biofeedback;Electrical Stimulation;Taping    PLAN FOR NEXT SESSION: See clinical impression for plan  GOALS: Goals reviewed with patient? Yes  SHORT TERM GOALS: Target date: 12/31/2023    Pt will demo IND with HEP                    Baseline: Not IND            Goal status: INITIAL   LONG TERM GOALS: Target date: 05/09/2024      1.Pt will demo proper deep core coordination without chest breathing and optimal excursion of diaphragm/pelvic floor in order to  improve urgency and leakage with bending up yard work . Baseline: dyscoordination Goal status:  Ongoing   2.  Pt will demo proper body mechanics in against gravity tasks and ADLs  work tasks, fitness  to minimize straining pelvic floor / back    Baseline: not IND, improper form that places strain on pelvic floor  Goal status:  MET     3. Pt will demo increased gait speed > 1.3 m/s with reciprocal gait pattern, longer stride length  in order to ambulate safely in community and build back up  to regular walking routine of 3 miles - 4-5 x a week  Baseline: decreased R stance, limited posterior rotation of R pelvis , 1.04 m/s  Goal status:   ongoing ( 02/29/24: 1.17 m/s reciprocal)    4. Pt will demo levelled pelvic girdle and shoulder height in order to progress to deep core strengthening HEP and restore mobility at spine, pelvis, gait, posture minimize falls, and improve balance  Baseline: L shoulder and L iliac crest lowered  Goal status:  MET    5. Pt will improve PFDI-7 questionnaire to  pts  score change  to demo improved QOL  Baseline:    ( greater pts indicate greater negative impact on QOL)     pts  ( total)    pts  ( UIQ-7 )    pts  ( CRAIQ-7 )    pts  ( POPIQ-7 )  Baseline:  Goal status: DEFERRED, not assessed   6. Pt will improve AROM with reach behind back ( R thumb  above level of L5) to be able to reeach behind to clean after BMs and shoulder flexion > 130 deg ) for reaching above cabinets  Baseline:  reach hand behind back: R thumb at PSIS level, L T 5 level   shoulder flexion seated: 120 deg R, 160 deg L    Wiping bowel movements are also affected by her limitation with reaching hand behind back    Reaching up to cabinets   Goal Status:  Partial yet ( 130 deg shoulder flexion R but reach behind back still limited)     7.  Pt will communicate with pulmonologist for sleep study to rule in / out OSA   Baseline:      Nocturia 2-3 x / night  Goal Status: MET Pt talked to pulmonologist and he did not recommend a sleep study.  ( Pt gets up 1 x night)    8.  Pt will demo 6 MWT increase distance > 1050, BORG < 4/10  Baseline:  925 ft, pre/post Tx:Sat O2: 96% / 97%, HR:  85 bpm, 96 bpm, BORG 5/10  ( pt reports BORG 5/10 with walking to her mailbox)   Goal Status:  INITIAL    Pia Lupe Plump, PT 03/23/2024, 9:39 AM  "

## 2024-03-29 ENCOUNTER — Ambulatory Visit: Admitting: Physical Therapy

## 2024-03-29 DIAGNOSIS — G8929 Other chronic pain: Secondary | ICD-10-CM

## 2024-03-29 DIAGNOSIS — M533 Sacrococcygeal disorders, not elsewhere classified: Secondary | ICD-10-CM | POA: Diagnosis not present

## 2024-03-29 DIAGNOSIS — M25611 Stiffness of right shoulder, not elsewhere classified: Secondary | ICD-10-CM

## 2024-03-29 NOTE — Therapy (Signed)
 " OUTPATIENT PHYSICAL THERAPY TREATMENT   Patient Name: Mariah Hogan MRN: [id number] DOB:Nov 25, 1950, 74 y.o., female Today's Date: 03/29/2024   PT End of Session - 03/29/24 1510     Visit Number 12    Number of Visits 19    Date for Recertification  05/09/24    PT Start Time 1504    PT Stop Time 1545    PT Time Calculation (min) 41 min    Activity Tolerance Patient tolerated treatment well;No increased pain    Behavior During Therapy WFL for tasks assessed/performed          Past Medical History:  Diagnosis Date   Anemia    Arthritis    Asthma    H/O YEARS AGO-NOW HAS COUGHING SPELLS WHICH IS WHY SHE HAS HER ALBUTEROL  INHALER   Basal cell carcinoma    R nose ([facility])   Basal cell carcinoma 01/29/2016   Right forehead above med brow   Basal cell carcinoma 10/12/2019   L nasal tip - MO[facility] 05/15/2020   BRCA gene mutation negative 08/12/2017   Variant of unknown significance at APC only abnormality.   Breast cancer (HCC) 08/04/2017   1.8 cm ER 90%, PR 20%, HER-2/neu negative invasive mammary carcinoma of the left upper outer quadrant.  MammaPrint: High risk.   Cancer (HCC)    [phone] basal cell carcinoma   Complication of anesthesia    HARD TIME WAKING UP   Dysplastic nevus 02/21/2010   Right mid pretibial, mild   Dysrhythmia    TACHYCARDIA   GERD (gastroesophageal reflux disease)    History of hiatal hernia    SMALL   Hyperlipidemia    Hypertension    PT STATES IN 01-18-18 PREOP PHONE INTERVIEW THAT HER BP HAS BEEN ELEVATED SINCE STARTING ON VALSARTAN- PCP SWITCHED PT BACK TO MAXIDE ON 01-18-18 AND PT STATES THAT SHE HAS LOST 6-10 POUNDS OF FLUID SINCE RESTARTING MAXIDE.     Personal history of chemotherapy    PONV (postoperative nausea and vomiting)    Sjogren's syndrome 07/2020   Past Surgical History:  Procedure Laterality Date   ABDOMINAL HYSTERECTOMY  1998   BIOPSY  03/27/2023   Procedure: BIOPSY;  Surgeon: Maryruth Ole DASEN, MD;  Location: [facility]  ENDOSCOPY;  Service: Endoscopy;;   bladder tack  [phone]   BREAST BIOPSY Left 1990   BREAST BIOPSY Left 2018   core bx- neg   BREAST BIOPSY Left 08/04/2017   left UOQ 2 oclock INVASIVE DUCTAL CARCINOMA.   BREAST BIOPSY Right 08/2017   MRI biopsy, FIBROCYSTIC CHANGE AND USUAL DUCTAL HYPERPLASIA,, clip placed   BREAST BIOPSY Right 11/11/2023   MM RT BREAST BX W LOC DEV 1ST LESION IMAGE BX SPEC STEREO GUIDE 11/11/2023 [facility]   BREAST CYST EXCISION  x3   BREAST EXCISIONAL BIOPSY Bilateral    BREAST RECONSTRUCTION WITH PLACEMENT OF TISSUE EXPANDER AND FLEX HD (ACELLULAR HYDRATED DERMIS) Left 01/27/2018   Procedure: BREAST RECONSTRUCTION WITH PLACEMENT OF TISSUE EXPANDER AND FLEX HD (ACELLULAR HYDRATED DERMIS);  Surgeon: Lowery Estefana RAMAN, DO;  Location: [facility] ORS;  Service: Plastics;  Laterality: Left;   BREAST SURGERY Left 02/13/14   Fibrocystic changes, pseudo-angiomatous stromal hyperplasia. No atypia or malignancy.   CARPAL TUNNEL RELEASE Bilateral x2   CHOLECYSTECTOMY  2008   COLONOSCOPY WITH PROPOFOL  N/A 01/12/2015   Procedure: COLONOSCOPY WITH PROPOFOL ;  Surgeon: Lamar DASEN Holmes, MD;  Location: [facility] ENDOSCOPY;  Service: Endoscopy;  Laterality: N/A;   COLONOSCOPY WITH PROPOFOL  N/A 02/06/2020  Procedure: COLONOSCOPY WITH PROPOFOL ;  Surgeon: Maryruth Ole DASEN, MD;  Location: [facility] ENDOSCOPY;  Service: Endoscopy;  Laterality: N/A;   ESOPHAGEAL DILATION  03/27/2023   Procedure: ESOPHAGEAL DILATION;  Surgeon: Maryruth Ole DASEN, MD;  Location: [facility] ENDOSCOPY;  Service: Endoscopy;;   ESOPHAGOGASTRODUODENOSCOPY (EGD) WITH PROPOFOL  N/A 01/12/2015   Procedure: ESOPHAGOGASTRODUODENOSCOPY (EGD) WITH PROPOFOL ;  Surgeon: Lamar DASEN Holmes, MD;  Location: [facility] ENDOSCOPY;  Service: Endoscopy;  Laterality: N/A;   ESOPHAGOGASTRODUODENOSCOPY (EGD) WITH PROPOFOL  N/A 02/06/2020   Procedure: ESOPHAGOGASTRODUODENOSCOPY (EGD) WITH PROPOFOL ;  Surgeon: Maryruth Ole DASEN, MD;  Location: [facility] ENDOSCOPY;   Service: Endoscopy;  Laterality: N/A;   ESOPHAGOGASTRODUODENOSCOPY (EGD) WITH PROPOFOL  N/A 03/27/2023   Procedure: ESOPHAGOGASTRODUODENOSCOPY (EGD) WITH PROPOFOL ;  Surgeon: Maryruth Ole DASEN, MD;  Location: [facility] ENDOSCOPY;  Service: Endoscopy;  Laterality: N/A;   LIPOSUCTION WITH LIPOFILLING Left 12/23/2018   Procedure: LIPOSUCTION WITH LIPOFILLING FROM ABDOMEN TO LEFT BREAST;  Surgeon: Lowery Estefana RAMAN, DO;  Location: [facility];  Service: Plastics;  Laterality: Left;  90 min, please   MASTECTOMY Left 2019   [facility]   MASTECTOMY W/ SENTINEL NODE BIOPSY Left 01/27/2018   ypT1c ypN0; ER 90%, PR 20%, HER-2/neu not overexpressed.  MASTECTOMY WITH SENTINEL LYMPH NODE BIOPSY;  Surgeon: Dessa Reyes ORN, MD;  Location: [facility] ORS;  Service: General;  Laterality: Left;   MINOR BREAST BIOPSY Right 09/02/2017   Radiology perform procedure, fibrocystic changes right retroareolar area.   NASAL RECONSTRUCTION  1983   OOPHORECTOMY     POLYPECTOMY  03/27/2023   Procedure: POLYPECTOMY;  Surgeon: Maryruth Ole DASEN, MD;  Location: [facility] ENDOSCOPY;  Service: Endoscopy;;   PORT-A-CATH REMOVAL     PORTACATH PLACEMENT N/A 10/21/2017   Procedure: INSERTION PORT-A-CATH;  Surgeon: Dessa Reyes ORN, MD;  Location: [facility] ORS;  Service: General;  Laterality: N/A;   REMOVAL OF TISSUE EXPANDER AND PLACEMENT OF IMPLANT Left 05/13/2018   Procedure: REMOVAL OF TISSUE EXPANDER AND PLACEMENT OF IMPLANT;  Surgeon: Lowery Estefana RAMAN, DO;  Location: [facility] ORS;  Service: Plastics;  Laterality: Left;  total surgery time should be 3 hours, per provider   REVERSE SHOULDER ARTHROPLASTY Right 11/27/2020   Procedure: REVERSE SHOULDER ARTHROPLASTY;  Surgeon: Edie Norleen PARAS, MD;  Location: [facility] ORS;  Service: Orthopedics;  Laterality: Right;   SAVORY DILATION N/A 01/12/2015   Procedure: SAVORY DILATION;  Surgeon: Lamar DASEN Holmes, MD;  Location: [facility] ENDOSCOPY;  Service: Endoscopy;  Laterality: N/A;   TONSILLECTOMY AND  ADENOIDECTOMY  1956   Patient Active Problem List   Diagnosis Date Noted   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   Status post reverse arthroplasty of shoulder, right 11/27/2020   Carcinoma of overlapping sites of left breast in female, estrogen receptor positive (HCC) 07/30/2020   Personal history of other malignant neoplasm of skin 11/17/2019   Complex tear of medial meniscus of left knee as current injury 08/10/2019   Primary osteoarthritis of left knee 08/10/2019   S/P breast reconstruction, left 04/15/2019   Acquired absence of left breast 12/23/2018   Breast asymmetry following reconstructive surgery 08/31/2018   Acquired absence of breast 02/09/2018   S/P mastectomy, left 02/02/2018   Breast cancer (HCC) 01/27/2018   Thrush 11/05/2017   Goals of care, counseling/discussion 10/09/2017   Hypokalemia 10/09/2017   Acute renal failure (ARF) 09/11/2017   Diarrhea 09/11/2017   Encounter for antineoplastic chemotherapy 09/03/2017   Nodule of upper lobe of right lung 08/24/2017   Osteopenia 08/24/2017   Malignant neoplasm of left female breast (HCC) 08/07/2017  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   Incomplete emptying of bladder 09/03/2016   SUI (stress urinary incontinence, female) 09/03/2016   Mass of right breast 02/14/2014   Mass of upper outer quadrant of left breast 01/20/2014   Arthritis 12/28/2013   Asthma without status asthmaticus 12/28/2013   Esophageal reflux 12/28/2013   Hyperlipidemia 12/28/2013   Hypertension 05/09/2003    PCP: [facility]   REFERRING PROVIDER: Stoiff   REFERRING DIAG: Overactive bladder with urge incontinence/stress incontinence for pelvic floor PT   Rationale for Evaluation and Treatment Rehabilitation  THERAPY DIAG:  Sacrococcygeal disorders, not elsewhere classified  Chronic pain of left knee  Stiffness of right shoulder, not elsewhere classified  ONSET DATE:   SUBJECTIVE:       SUBJECTIVE STATEMENT TODAY:   Pt reported she reported her neck felt looser after last session  Pt continues to walk 6 min in the house but had to break them up into 3 min sets due to SOB Pt will be seeing her pulmonologist next week for another breathing test and lung function. The time before it was 90%,  last time it dropped to 75% .   SUBJECTIVE STATEMENT ON EVAL 12/03/23 : 1) Urinary Leakage:  pt does not wear pads. Pt was going through 5-6 pads before she started urinary medication.  Now pt has urgency. Pt has leakage with bending up yard work .  Nocturia 2-3 x / night    2) L knee pain - Pt was suppose to get L TKA but then she held it off because she had get R shoulder surgery. Pt used to walk 3 miles across 4-5 x a week but she has stopped due to pain. 5/10 level  with going up stairs,  getting into SUVs, bending   3) R shoulder stiffness:  Pt had total shoulder reverse placement and underwent PT. Pt is still limited in not raising as high and behind her back like she used to. Pt has dropped a few things. Wiping bowel movements are also affected by her limitation with reaching hand behind back    PERTINENT HISTORY:  L mastectomy with breat implant for breast CA R shoulder surgery  Gall bladder removal  3 Bladder slings surgeries Hysterectomy Basal cell cancer    PAIN:  Are you having pain? Yes: see above  PRECAUTIONS: No  WEIGHT BEARING RESTRICTIONS: No   FALLS:  Has patient fallen in last 6 months? No   LIVING ENVIRONMENT: Lives with: alone  Lives in: one story  Stairs: 4 STE with rail   OCCUPATION: retired psychologist, occupational ,    PLOF: IND   PATIENT GOALS:   Improve leakage, R shoulder ROM, and decrease L knee pain and return walking routine     OBJECTIVE:     [facility] PT Assessment - 03/29/24 1545       Palpation   Spinal mobility levelled  shoudlers and pelvis , tightness and resitricted expansion of intercostal R, anterior T7-8,9-10    SI assessment  deviated and hypombile T7-10, tendernes,  interspinal mm tightness B          [facility] Adult PT Treatment/Exercise - 03/29/24 1546       Manual Therapy   Manual therapy comments STM/MWM at problem areas noted in assessment to promote lateral and antrior excursion of diaphragm / deep core training             HOME EXERCISE PROGRAM: See pt instruction section    ASSESSMENT:  CLINICAL  IMPRESSION: Pt achieved 3 /8 goals     Pt showed levelled pelvis and shoulders are levelled and no longer has forward head/ thoracic kyphosis with manual Tx that addressed curves in spine and also with   shoe lift in toe box and heel of R shoe Only getting up 1 x a night to urinate instead of 2-3 x night  Improving gait speed  Increased R shoulder flexion AROM  Pt still benefits from skilled PT to help minimize SUI, L knee pain and R shoulder AROM .   Today:  Pt continued to require manual Tx at thoracic area  to  lateral and antrior excursion of diaphragm / deep core training and optimize lung capacity. This pst week, pt needed to walk 3 min sets instead of 6 min due to SOB.  Pt will be seeing her pulmonologist next week for another breathing test and lung function. The time before it was 90%,  last time it dropped to 75% . Pulmonary issues impact SUI with coughing. Improving stacked posture and breathing capacity with more diaphragm expansion will be helpful Pt has Hx of mastectomy and will need scar assessment next session to address fascial limitations.     Created a chart for all stretches along  spine to maintain upright posture for optimal IAP system for SUI . Plan to work on pelvic floor propioception with deep core level 1-2 next session  Plan to assess pt with stairs as pt reports L knee pain with stairs.   Anticipate these  help promote optimize IAP system for improved pelvic floor function, trunk stability, gait, balance, stabilization with mobility tasks.                                                    Pt benefits from  skilled PT to achieve remaining goals    OBJECTIVE IMPAIRMENTS decreased activity tolerance, decreased coordination, decreased endurance, decreased mobility, difficulty walking, decreased ROM, decreased strength, decreased safety awareness, hypomobility, increased muscle spasms, impaired flexibility, improper body mechanics, postural dysfunction, and pain, scar restrictions   ACTIVITY LIMITATIONS  self-care, home chores, community activities    PARTICIPATION LIMITATIONS:  community,  walking activities    PERSONAL FACTORS   affecting patient's functional outcome:    REHAB POTENTIAL: Good   CLINICAL DECISION MAKING: Evolving/moderate complexity   EVALUATION COMPLEXITY: Moderate    PATIENT EDUCATION:    Education details: Showed pt anatomy images. Explained muscles attachments/ connection, physiology of deep core system/ spinal- thoracic-pelvis-lower kinetic chain as they relate to pt's presentation, Sx, and past Hx. Explained what and how these areas of deficits need to be restored to balance and function    See Therapeutic activity / neuromuscular re-education section  Answered pt's questions.   Person educated: Patient Education method: Explanation, Demonstration, Tactile cues, Verbal cues, and Handouts Education comprehension: verbalized understanding, returned demonstration, verbal cues required, tactile cues required, and needs further education     PLAN: PT FREQUENCY: 1x/week   PT DURATION: 10 weeks   PLANNED INTERVENTIONS:   Gait training;Stair training;Functional mobility training;DME Instruction;Therapeutic activities;Therapeutic exercise;Balance training;Neuromuscular re-education;Patient/family education;Vestibular;Visual/perceptual remediation/compensation;Passive range of motion;Moist Heat;Cryotherapy;Traction;Canalith Repostioning;Joint Manipulations;Manual lymph drainage;Manual techniques;Scar mobilization;Energy conservation;Dry needling;ADLs/Self Care Home  Management;Biofeedback;Electrical Stimulation;Taping    PLAN FOR NEXT SESSION: See clinical impression for plan     GOALS: Goals reviewed with patient? Yes  SHORT TERM  GOALS: Target date: 12/31/2023    Pt will demo IND with HEP                    Baseline: Not IND            Goal status: INITIAL   LONG TERM GOALS: Target date: 05/09/2024      1.Pt will demo proper deep core coordination without chest breathing and optimal excursion of diaphragm/pelvic floor in order to  improve urgency and leakage with bending up yard work . Baseline: dyscoordination Goal status:  Ongoing   2.  Pt will demo proper body mechanics in against gravity tasks and ADLs  work tasks, fitness  to minimize straining pelvic floor / back    Baseline: not IND, improper form that places strain on pelvic floor  Goal status:  MET     3. Pt will demo increased gait speed > 1.3 m/s with reciprocal gait pattern, longer stride length  in order to ambulate safely in community and build back up  to regular walking routine of 3 miles - 4-5 x a week  Baseline: decreased R stance, limited posterior rotation of R pelvis , 1.04 m/s  Goal status:   ongoing ( 02/29/24: 1.17 m/s reciprocal)    4. Pt will demo levelled pelvic girdle and shoulder height in order to progress to deep core strengthening HEP and restore mobility at spine, pelvis, gait, posture minimize falls, and improve balance  Baseline: L shoulder and L iliac crest lowered  Goal status:  MET    5. Pt will improve PFDI-7 questionnaire to  pts  score change  to demo improved QOL  Baseline:    ( greater pts indicate greater negative impact on QOL)     pts  ( total)    pts  ( UIQ-7 )    pts  ( CRAIQ-7 )    pts  ( POPIQ-7 )  Baseline:  Goal status: DEFERRED, not assessed   6. Pt will improve AROM with reach behind back ( R thumb above level of L5) to be able to reeach behind to clean after BMs and shoulder flexion > 130 deg ) for reaching above  cabinets  Baseline:  reach hand behind back: R thumb at PSIS level, L T 5 level   shoulder flexion seated: 120 deg R, 160 deg L    Wiping bowel movements are also affected by her limitation with reaching hand behind back    Reaching up to cabinets   Goal Status:  Partial yet ( 130 deg shoulder flexion R but reach behind back still limited)     7.  Pt will communicate with pulmonologist for sleep study to rule in / out OSA   Baseline:      Nocturia 2-3 x / night  Goal Status: MET Pt talked to pulmonologist and he did not recommend a sleep study.  ( Pt gets up 1 x night)    8.  Pt will demo 6 MWT increase distance > 1050, BORG < 4/10  Baseline:  925 ft, pre/post Tx:Sat O2: 96% / 97%, HR:  85 bpm, 96 bpm, BORG 5/10  ( pt reports BORG 5/10 with walking to her mailbox)   Goal Status:  INITIAL    Pia Lupe Plump, PT 03/29/2024, 3:12 PM  "

## 2024-04-01 ENCOUNTER — Other Ambulatory Visit: Payer: Self-pay | Admitting: Pulmonary Disease

## 2024-04-01 DIAGNOSIS — J984 Other disorders of lung: Secondary | ICD-10-CM

## 2024-04-01 DIAGNOSIS — J849 Interstitial pulmonary disease, unspecified: Secondary | ICD-10-CM

## 2024-04-06 ENCOUNTER — Ambulatory Visit: Admitting: Dermatology

## 2024-04-07 ENCOUNTER — Ambulatory Visit: Admitting: Physical Therapy

## 2024-04-12 ENCOUNTER — Ambulatory Visit: Admitting: Dermatology

## 2024-04-18 ENCOUNTER — Ambulatory Visit

## 2024-04-19 ENCOUNTER — Ambulatory Visit: Attending: Urology | Admitting: Physical Therapy

## 2024-05-05 ENCOUNTER — Ambulatory Visit: Admitting: Dermatology

## 2024-06-22 ENCOUNTER — Ambulatory Visit: Admitting: Dermatology

## 2024-07-20 ENCOUNTER — Ambulatory Visit: Admitting: Urology

## 2024-10-11 ENCOUNTER — Encounter (INDEPENDENT_AMBULATORY_CARE_PROVIDER_SITE_OTHER)

## 2024-10-11 ENCOUNTER — Ambulatory Visit (INDEPENDENT_AMBULATORY_CARE_PROVIDER_SITE_OTHER): Admitting: Vascular Surgery
